# Patient Record
Sex: Male | Born: 1960 | Race: White | Hispanic: No | Smoking: Former smoker
Health system: Southern US, Community
[De-identification: ages and names within clinical notes are randomized; demographics above are authoritative.]

## PROBLEM LIST (undated history)

## (undated) DIAGNOSIS — F329 Major depressive disorder, single episode, unspecified: Secondary | ICD-10-CM

## (undated) DIAGNOSIS — M6281 Muscle weakness (generalized): Secondary | ICD-10-CM

## (undated) DIAGNOSIS — R41841 Cognitive communication deficit: Secondary | ICD-10-CM

## (undated) DIAGNOSIS — D649 Anemia, unspecified: Secondary | ICD-10-CM

## (undated) DIAGNOSIS — I10 Essential (primary) hypertension: Secondary | ICD-10-CM

## (undated) DIAGNOSIS — L02415 Cutaneous abscess of right lower limb: Secondary | ICD-10-CM

## (undated) DIAGNOSIS — I2510 Atherosclerotic heart disease of native coronary artery without angina pectoris: Secondary | ICD-10-CM

## (undated) DIAGNOSIS — F1010 Alcohol abuse, uncomplicated: Secondary | ICD-10-CM

## (undated) DIAGNOSIS — I739 Peripheral vascular disease, unspecified: Secondary | ICD-10-CM

## (undated) DIAGNOSIS — G4730 Sleep apnea, unspecified: Secondary | ICD-10-CM

## (undated) DIAGNOSIS — L03115 Cellulitis of right lower limb: Secondary | ICD-10-CM

## (undated) DIAGNOSIS — I82210 Acute embolism and thrombosis of superior vena cava: Secondary | ICD-10-CM

## (undated) DIAGNOSIS — J449 Chronic obstructive pulmonary disease, unspecified: Secondary | ICD-10-CM

## (undated) DIAGNOSIS — K219 Gastro-esophageal reflux disease without esophagitis: Secondary | ICD-10-CM

## (undated) DIAGNOSIS — I509 Heart failure, unspecified: Secondary | ICD-10-CM

## (undated) DIAGNOSIS — E039 Hypothyroidism, unspecified: Secondary | ICD-10-CM

## (undated) DIAGNOSIS — J45909 Unspecified asthma, uncomplicated: Secondary | ICD-10-CM

## (undated) DIAGNOSIS — G629 Polyneuropathy, unspecified: Secondary | ICD-10-CM

## (undated) DIAGNOSIS — E7800 Pure hypercholesterolemia, unspecified: Secondary | ICD-10-CM

## (undated) DIAGNOSIS — R600 Localized edema: Secondary | ICD-10-CM

## (undated) DIAGNOSIS — J189 Pneumonia, unspecified organism: Secondary | ICD-10-CM

## (undated) DIAGNOSIS — E785 Hyperlipidemia, unspecified: Secondary | ICD-10-CM

## (undated) DIAGNOSIS — G4733 Obstructive sleep apnea (adult) (pediatric): Secondary | ICD-10-CM

## (undated) DIAGNOSIS — N189 Chronic kidney disease, unspecified: Secondary | ICD-10-CM

## (undated) DIAGNOSIS — R2689 Other abnormalities of gait and mobility: Secondary | ICD-10-CM

## (undated) DIAGNOSIS — E781 Pure hyperglyceridemia: Secondary | ICD-10-CM

## (undated) DIAGNOSIS — E119 Type 2 diabetes mellitus without complications: Secondary | ICD-10-CM

## (undated) HISTORY — DX: Hyperlipidemia, unspecified: E78.5

## (undated) HISTORY — DX: Alcohol abuse, uncomplicated: F10.10

## (undated) HISTORY — DX: Obstructive sleep apnea (adult) (pediatric): G47.33

## (undated) HISTORY — DX: Other abnormalities of gait and mobility: R26.89

## (undated) HISTORY — DX: Muscle weakness (generalized): M62.81

## (undated) HISTORY — DX: Acute embolism and thrombosis of superior vena cava: I82.210

## (undated) HISTORY — DX: Major depressive disorder, single episode, unspecified: F32.9

## (undated) HISTORY — DX: Cognitive communication deficit: R41.841

---

## 2004-11-30 ENCOUNTER — Ambulatory Visit (HOSPITAL_COMMUNITY): Admission: RE | Admit: 2004-11-30 | Discharge: 2004-11-30 | Payer: Self-pay | Admitting: Emergency Medicine

## 2004-11-30 ENCOUNTER — Encounter (INDEPENDENT_AMBULATORY_CARE_PROVIDER_SITE_OTHER): Payer: Self-pay | Admitting: *Deleted

## 2004-11-30 ENCOUNTER — Inpatient Hospital Stay (HOSPITAL_COMMUNITY): Admission: EM | Admit: 2004-11-30 | Discharge: 2004-12-07 | Payer: Self-pay | Admitting: Emergency Medicine

## 2005-07-16 ENCOUNTER — Ambulatory Visit: Payer: Self-pay | Admitting: Endocrinology

## 2005-07-18 ENCOUNTER — Ambulatory Visit: Payer: Self-pay | Admitting: Endocrinology

## 2005-08-16 ENCOUNTER — Ambulatory Visit: Payer: Self-pay | Admitting: Endocrinology

## 2005-09-06 ENCOUNTER — Ambulatory Visit: Payer: Self-pay | Admitting: Endocrinology

## 2007-01-02 ENCOUNTER — Ambulatory Visit: Payer: Self-pay | Admitting: Endocrinology

## 2007-06-17 ENCOUNTER — Encounter: Payer: Self-pay | Admitting: Endocrinology

## 2007-06-17 DIAGNOSIS — I10 Essential (primary) hypertension: Secondary | ICD-10-CM

## 2007-06-17 DIAGNOSIS — K219 Gastro-esophageal reflux disease without esophagitis: Secondary | ICD-10-CM

## 2007-07-28 ENCOUNTER — Ambulatory Visit: Payer: Self-pay | Admitting: Endocrinology

## 2007-07-29 ENCOUNTER — Encounter: Payer: Self-pay | Admitting: Endocrinology

## 2007-07-29 ENCOUNTER — Ambulatory Visit: Payer: Self-pay | Admitting: Endocrinology

## 2007-08-22 ENCOUNTER — Telehealth: Payer: Self-pay | Admitting: Endocrinology

## 2007-09-17 ENCOUNTER — Telehealth (INDEPENDENT_AMBULATORY_CARE_PROVIDER_SITE_OTHER): Payer: Self-pay | Admitting: *Deleted

## 2007-12-29 ENCOUNTER — Encounter: Payer: Self-pay | Admitting: Endocrinology

## 2008-02-19 ENCOUNTER — Ambulatory Visit: Payer: Self-pay | Admitting: Family Medicine

## 2008-02-19 DIAGNOSIS — E785 Hyperlipidemia, unspecified: Secondary | ICD-10-CM

## 2008-02-19 DIAGNOSIS — E669 Obesity, unspecified: Secondary | ICD-10-CM | POA: Insufficient documentation

## 2008-02-19 DIAGNOSIS — F329 Major depressive disorder, single episode, unspecified: Secondary | ICD-10-CM | POA: Insufficient documentation

## 2008-02-19 DIAGNOSIS — E1169 Type 2 diabetes mellitus with other specified complication: Secondary | ICD-10-CM

## 2008-03-23 ENCOUNTER — Ambulatory Visit: Payer: Self-pay | Admitting: Endocrinology

## 2008-04-07 ENCOUNTER — Telehealth: Payer: Self-pay | Admitting: Family Medicine

## 2008-04-13 ENCOUNTER — Ambulatory Visit: Payer: Self-pay | Admitting: Endocrinology

## 2008-04-19 ENCOUNTER — Encounter: Payer: Self-pay | Admitting: Family Medicine

## 2008-04-19 ENCOUNTER — Ambulatory Visit: Payer: Self-pay | Admitting: Sports Medicine

## 2008-04-19 LAB — CONVERTED CEMR LAB: Hgb A1c MFr Bld: 8.2 %

## 2008-04-20 LAB — CONVERTED CEMR LAB
ALT: 28 units/L (ref 0–53)
AST: 19 units/L (ref 0–37)
Albumin: 3.8 g/dL (ref 3.5–5.2)
CO2: 26 meq/L (ref 19–32)
Chloride: 97 meq/L (ref 96–112)
Creatinine, Ser: 1.54 mg/dL — ABNORMAL HIGH (ref 0.40–1.50)
Glucose, Bld: 260 mg/dL — ABNORMAL HIGH (ref 70–99)
Hemoglobin: 14.1 g/dL (ref 13.0–17.0)
LDL Cholesterol: 48 mg/dL (ref 0–99)
MCV: 87.6 fL (ref 78.0–100.0)
Potassium: 4.5 meq/L (ref 3.5–5.3)
RBC: 5 M/uL (ref 4.22–5.81)
Total CHOL/HDL Ratio: 5.2
Total Protein: 6.8 g/dL (ref 6.0–8.3)
Triglycerides: 385 mg/dL — ABNORMAL HIGH (ref <150)
VLDL: 77 mg/dL — ABNORMAL HIGH (ref 0–40)

## 2008-04-26 ENCOUNTER — Telehealth (INDEPENDENT_AMBULATORY_CARE_PROVIDER_SITE_OTHER): Payer: Self-pay | Admitting: *Deleted

## 2008-05-19 ENCOUNTER — Telehealth (INDEPENDENT_AMBULATORY_CARE_PROVIDER_SITE_OTHER): Payer: Self-pay | Admitting: *Deleted

## 2008-06-07 ENCOUNTER — Telehealth (INDEPENDENT_AMBULATORY_CARE_PROVIDER_SITE_OTHER): Payer: Self-pay | Admitting: *Deleted

## 2008-06-08 ENCOUNTER — Encounter: Payer: Self-pay | Admitting: Family Medicine

## 2008-06-16 ENCOUNTER — Telehealth (INDEPENDENT_AMBULATORY_CARE_PROVIDER_SITE_OTHER): Payer: Self-pay | Admitting: *Deleted

## 2008-06-18 ENCOUNTER — Telehealth (INDEPENDENT_AMBULATORY_CARE_PROVIDER_SITE_OTHER): Payer: Self-pay | Admitting: *Deleted

## 2008-06-24 ENCOUNTER — Encounter: Payer: Self-pay | Admitting: Family Medicine

## 2008-07-14 ENCOUNTER — Ambulatory Visit: Payer: Self-pay | Admitting: Family Medicine

## 2008-08-11 ENCOUNTER — Ambulatory Visit: Payer: Self-pay | Admitting: Endocrinology

## 2008-09-21 ENCOUNTER — Ambulatory Visit: Payer: Self-pay | Admitting: Family Medicine

## 2008-10-08 HISTORY — PX: APPENDECTOMY: SHX54

## 2008-11-02 ENCOUNTER — Ambulatory Visit: Payer: Self-pay | Admitting: Endocrinology

## 2008-11-02 LAB — CONVERTED CEMR LAB: Hgb A1c MFr Bld: 8.7 % — ABNORMAL HIGH (ref 4.6–6.0)

## 2008-12-16 ENCOUNTER — Encounter: Payer: Self-pay | Admitting: Family Medicine

## 2008-12-16 ENCOUNTER — Ambulatory Visit: Payer: Self-pay | Admitting: Family Medicine

## 2008-12-16 DIAGNOSIS — R609 Edema, unspecified: Secondary | ICD-10-CM | POA: Insufficient documentation

## 2008-12-17 ENCOUNTER — Telehealth: Payer: Self-pay | Admitting: Family Medicine

## 2008-12-17 LAB — CONVERTED CEMR LAB
BUN: 17 mg/dL (ref 6–23)
Glucose, Bld: 64 mg/dL — ABNORMAL LOW (ref 70–99)
Potassium: 4.1 meq/L (ref 3.5–5.3)
Sodium: 139 meq/L (ref 135–145)

## 2009-01-03 ENCOUNTER — Telehealth (INDEPENDENT_AMBULATORY_CARE_PROVIDER_SITE_OTHER): Payer: Self-pay | Admitting: *Deleted

## 2009-01-13 ENCOUNTER — Telehealth: Payer: Self-pay | Admitting: Family Medicine

## 2009-01-24 ENCOUNTER — Encounter: Payer: Self-pay | Admitting: Family Medicine

## 2009-01-24 ENCOUNTER — Ambulatory Visit: Payer: Self-pay | Admitting: Family Medicine

## 2009-01-24 DIAGNOSIS — G4730 Sleep apnea, unspecified: Secondary | ICD-10-CM

## 2009-01-24 DIAGNOSIS — G4733 Obstructive sleep apnea (adult) (pediatric): Secondary | ICD-10-CM | POA: Insufficient documentation

## 2009-01-25 LAB — CONVERTED CEMR LAB
CO2: 23 meq/L (ref 19–32)
Calcium: 9.9 mg/dL (ref 8.4–10.5)
Chloride: 101 meq/L (ref 96–112)
Creatinine, Ser: 1.48 mg/dL (ref 0.40–1.50)

## 2009-01-28 ENCOUNTER — Telehealth: Payer: Self-pay | Admitting: Family Medicine

## 2009-02-01 ENCOUNTER — Ambulatory Visit: Payer: Self-pay | Admitting: Endocrinology

## 2009-02-07 ENCOUNTER — Ambulatory Visit (HOSPITAL_BASED_OUTPATIENT_CLINIC_OR_DEPARTMENT_OTHER): Admission: RE | Admit: 2009-02-07 | Discharge: 2009-02-07 | Payer: Self-pay | Admitting: Family Medicine

## 2009-02-07 ENCOUNTER — Encounter: Payer: Self-pay | Admitting: Family Medicine

## 2009-02-08 ENCOUNTER — Encounter: Payer: Self-pay | Admitting: Family Medicine

## 2009-02-10 ENCOUNTER — Telehealth: Payer: Self-pay | Admitting: Family Medicine

## 2009-02-19 ENCOUNTER — Ambulatory Visit: Payer: Self-pay | Admitting: Internal Medicine

## 2009-03-03 ENCOUNTER — Ambulatory Visit: Payer: Self-pay | Admitting: Endocrinology

## 2009-03-03 DIAGNOSIS — E1139 Type 2 diabetes mellitus with other diabetic ophthalmic complication: Secondary | ICD-10-CM

## 2009-03-03 LAB — CONVERTED CEMR LAB: Hgb A1c MFr Bld: 8 % — ABNORMAL HIGH (ref 4.6–6.5)

## 2009-03-09 ENCOUNTER — Telehealth: Payer: Self-pay | Admitting: Family Medicine

## 2009-03-23 ENCOUNTER — Encounter: Payer: Self-pay | Admitting: Endocrinology

## 2009-04-19 ENCOUNTER — Ambulatory Visit: Payer: Self-pay | Admitting: Family Medicine

## 2009-04-20 ENCOUNTER — Telehealth: Payer: Self-pay | Admitting: Family Medicine

## 2009-04-25 ENCOUNTER — Telehealth: Payer: Self-pay | Admitting: Family Medicine

## 2009-04-27 ENCOUNTER — Telehealth: Payer: Self-pay | Admitting: *Deleted

## 2009-05-11 ENCOUNTER — Telehealth (INDEPENDENT_AMBULATORY_CARE_PROVIDER_SITE_OTHER): Payer: Self-pay | Admitting: *Deleted

## 2009-05-12 ENCOUNTER — Telehealth: Payer: Self-pay | Admitting: Family Medicine

## 2009-05-16 ENCOUNTER — Ambulatory Visit: Payer: Self-pay | Admitting: Family Medicine

## 2009-05-17 ENCOUNTER — Telehealth: Payer: Self-pay | Admitting: Endocrinology

## 2009-05-19 ENCOUNTER — Ambulatory Visit: Payer: Self-pay | Admitting: Family Medicine

## 2009-05-23 ENCOUNTER — Telehealth: Payer: Self-pay | Admitting: Endocrinology

## 2009-05-24 ENCOUNTER — Ambulatory Visit (HOSPITAL_BASED_OUTPATIENT_CLINIC_OR_DEPARTMENT_OTHER): Admission: RE | Admit: 2009-05-24 | Discharge: 2009-05-24 | Payer: Self-pay | Admitting: Family Medicine

## 2009-05-24 ENCOUNTER — Encounter: Payer: Self-pay | Admitting: Family Medicine

## 2009-05-25 ENCOUNTER — Telehealth: Payer: Self-pay | Admitting: Family Medicine

## 2009-05-28 ENCOUNTER — Ambulatory Visit: Payer: Self-pay | Admitting: Internal Medicine

## 2009-06-03 ENCOUNTER — Encounter: Payer: Self-pay | Admitting: Family Medicine

## 2009-06-09 ENCOUNTER — Ambulatory Visit: Payer: Self-pay | Admitting: Family Medicine

## 2009-07-26 ENCOUNTER — Telehealth: Payer: Self-pay | Admitting: Family Medicine

## 2009-07-28 ENCOUNTER — Telehealth: Payer: Self-pay | Admitting: *Deleted

## 2009-10-25 ENCOUNTER — Encounter: Payer: Self-pay | Admitting: Family Medicine

## 2009-10-25 ENCOUNTER — Ambulatory Visit: Payer: Self-pay | Admitting: Family Medicine

## 2009-10-25 ENCOUNTER — Encounter: Payer: Self-pay | Admitting: *Deleted

## 2009-10-26 LAB — CONVERTED CEMR LAB
ALT: 58 units/L — ABNORMAL HIGH (ref 0–53)
Alkaline Phosphatase: 111 units/L (ref 39–117)
BUN: 17 mg/dL (ref 6–23)
CO2: 29 meq/L (ref 19–32)
Calcium: 9.6 mg/dL (ref 8.4–10.5)
Chloride: 98 meq/L (ref 96–112)
Glucose, Bld: 164 mg/dL — ABNORMAL HIGH (ref 70–99)
HDL: 37 mg/dL — ABNORMAL LOW (ref >39)
LDL Cholesterol: 74 mg/dL (ref 0–99)
Sodium: 136 meq/L (ref 135–145)
Total CHOL/HDL Ratio: 5.1
Triglycerides: 380 mg/dL — ABNORMAL HIGH (ref <150)
VLDL: 76 mg/dL — ABNORMAL HIGH (ref 0–40)

## 2009-10-27 ENCOUNTER — Encounter: Payer: Self-pay | Admitting: Family Medicine

## 2009-10-27 ENCOUNTER — Telehealth: Payer: Self-pay | Admitting: Family Medicine

## 2009-11-03 ENCOUNTER — Ambulatory Visit: Payer: Self-pay | Admitting: Endocrinology

## 2009-11-15 ENCOUNTER — Telehealth: Payer: Self-pay | Admitting: Family Medicine

## 2009-11-22 ENCOUNTER — Ambulatory Visit: Payer: Self-pay | Admitting: Family Medicine

## 2009-11-30 ENCOUNTER — Telehealth: Payer: Self-pay | Admitting: Family Medicine

## 2010-02-21 ENCOUNTER — Telehealth: Payer: Self-pay | Admitting: Family Medicine

## 2010-05-24 ENCOUNTER — Ambulatory Visit: Payer: Self-pay | Admitting: Family Medicine

## 2010-06-28 ENCOUNTER — Encounter: Payer: Self-pay | Admitting: Family Medicine

## 2010-06-28 ENCOUNTER — Ambulatory Visit: Payer: Self-pay | Admitting: Family Medicine

## 2010-06-28 LAB — CONVERTED CEMR LAB
AST: 21 units/L (ref 0–37)
Albumin: 3.9 g/dL (ref 3.5–5.2)
CO2: 28 meq/L (ref 19–32)
Calcium: 9.4 mg/dL (ref 8.4–10.5)
Cholesterol: 170 mg/dL (ref 0–200)
Creatinine, Ser: 1.27 mg/dL (ref 0.40–1.50)
Glucose, Bld: 152 mg/dL — ABNORMAL HIGH (ref 70–99)
HCT: 47 % (ref 39.0–52.0)
HDL: 36 mg/dL — ABNORMAL LOW (ref >39)
Hgb A1c MFr Bld: 7.7 %
LDL Cholesterol: 57 mg/dL (ref 0–99)
MCHC: 31.7 g/dL (ref 30.0–36.0)
Potassium: 4.3 meq/L (ref 3.5–5.3)
Sodium: 139 meq/L (ref 135–145)
Total CHOL/HDL Ratio: 4.7
Triglycerides: 384 mg/dL — ABNORMAL HIGH (ref <150)
VLDL: 77 mg/dL — ABNORMAL HIGH (ref 0–40)

## 2010-07-03 ENCOUNTER — Telehealth: Payer: Self-pay | Admitting: Family Medicine

## 2010-07-04 ENCOUNTER — Encounter: Payer: Self-pay | Admitting: Family Medicine

## 2010-08-01 ENCOUNTER — Telehealth: Payer: Self-pay | Admitting: Family Medicine

## 2010-08-07 ENCOUNTER — Telehealth: Payer: Self-pay | Admitting: Endocrinology

## 2010-08-08 ENCOUNTER — Telehealth: Payer: Self-pay | Admitting: Endocrinology

## 2010-09-13 ENCOUNTER — Encounter: Payer: Self-pay | Admitting: Family Medicine

## 2010-10-03 ENCOUNTER — Telehealth: Payer: Self-pay | Admitting: Family Medicine

## 2010-10-06 ENCOUNTER — Telehealth: Payer: Self-pay | Admitting: *Deleted

## 2010-11-06 ENCOUNTER — Telehealth: Payer: Self-pay | Admitting: Family Medicine

## 2010-11-09 NOTE — Progress Notes (Signed)
Summary: Furosemide 20mg  qd-bid prn #90 x0  Phone Note Refill Request Call back at [phone] Message from:  Patient  Refills Requested: Medication #1:  FUROSEMIDE 20 MG TABS 1 tablet by mouth twice a day for edema   Supply Requested: 3 months Initial call taken by: Audie Clear,  November 15, 2009 10:42 AM  Follow-up for Phone Call        to pcp Follow-up by: Elige Radon RN,  November 15, 2009 10:44 AM    Prescriptions: FUROSEMIDE 20 MG TABS (FUROSEMIDE) 1 tablet by mouth twice a day for edema  #90 x 0   Entered and Authorized by:   Dion Body  MD   Signed by:   Dion Body  MD on 11/15/2009   Method used:   Electronically to        [facility]. [phone]* (retail)       [address]       [place], [place]  [address]       Ph: [phone] or [phone]       Fax: [phone]   RxID:   [id number]

## 2010-11-09 NOTE — Assessment & Plan Note (Signed)
Summary: f/u chronic issues   Vital Signs:  Patient profile:   50 year old male Height:      71 inches Weight:      310 pounds BMI:     43.39 BSA:     2.54 Temp:     99.2 degrees F Pulse rate:   86 / minute BP sitting:   179 / 99  Vitals Entered By: Christen Bame CMA (October 25, 2009 8:52 AM)  Serial Vital Signs/Assessments:  Time      Position  BP       Pulse  Resp  Temp     By                     168/98                         Dion Body  MD  Comments: 8:55 AM Manual BP: 160/98 By: Christen Bame CMA  Right arm- sitting, manual By: Dion Body  MD   CC: f/u Is Patient Diabetic? Yes Pain Assessment Patient in pain? no        Primary Care Provider:  Dion Body  MD  CC:  f/u.  History of Present Illness: 50yo M here to f/u on chronic issues  HTN: No adverse effects from medication.  Not checking it regularly.  Was well controlled at last visit.  No dizziness, HA, CP, palpitations.  Improved swelling with use of compression stockings, exercise, and furosemide.  DM:  Currently on Humulin and Novolog.  No adverse effects.  Isolated hypoglycemic events when he doesn't eat.  No paresthesia or peripheral nerve pain.  CBGs checked 2-4 times a day and typically run b/w 130-170 with occasional readings up to 300.    HLD: Tolerating medication.  No adverse effects.  No muscle pain or abd pain.  Obesity: Lost 18 lbs in the past Nov with exercise and healthy food choices.  Sleep Apnea: Not able to tolerate mask.  Currently on full mask.  Has not used the CPAP in a few months.  Habits & Providers  Alcohol-Tobacco-Diet     Tobacco Status: never  Current Medications (verified): 1)  Zetia 10 Mg  Tabs (Ezetimibe) .... Take 1 By Mouth Qd 2)  Altace 10 Mg  Caps (Ramipril) .... Take 1 By Mouth Bid 3)  Metoprolol Succinate 200 Mg Xr24h-Tab (Metoprolol Succinate) .... One Tablet By Mouth Daily 4)  Crestor 40 Mg  Tabs (Rosuvastatin Calcium) .... Take  One Tablet By Mouth At Bedtime 5)  Exel Pen Needles 1/3" 31g X 8 Mm  Misc (Insulin Pen Needle) .... Qid--Any Brand 6)  Accu-Chek Multiclix Lancets  Misc (Lancets) .... Check Blood Sugar Up To 3 Times A Day Disp:  1 Box 7)  Bupropion Hcl 150 Mg  Tb24 (Bupropion Hcl) .... Take One Tablet By Mouth Daily 8)  Nexium 20 Mg Pack (Esomeprazole Magnesium) .... One Tablet By Mouth Daily 9)  Anacin 81 Mg  Tbec (Aspirin) .... Take One Tablet By Mouth Daily 10)  Novolog Flexpen 100 Unit/ml  Soln (Insulin Aspart) .... Taken 4 Times A Day, 30-45-60-10 Units 11)  Humulin N Pen 100 Unit/ml Susp (Insulin Isophane Human) .... 30 Units Qhs 12)  Furosemide 20 Mg Tabs (Furosemide) .Marland Kitchen.. 1 Tablet By Mouth Twice A Day For Edema Disp: 90 Day Supply 13)  Accu-Chek Aviva  Strp (Glucose Blood) .... Use To Check Blood Sugar Up To 3 Times A Day  Disp: 1 Box 14)  Amlodipine Besylate 5 Mg Tabs (Amlodipine Besylate) .Marland Kitchen.. 1 Tablet By Mouth Daily For High Blood Pressure  Allergies (verified): 1)  ! Codeine 2)  Metformin Hcl  Past History:  Past Medical History: Diabetes mellitus, type II GERD Hypertension Hyperlipidemia Morbid Obesity Depression Diabetic retinopathy OSA  Social History: Separated from wife with  daughter.  Lives in [place].  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  No tobacco use.  Rarely drinks EtOH.  No drugs.  Cycling on the weekends.  [facility] contact # [phone] (cell)  Review of Systems       No dizziness, HA, CP, palpitations.  Improved swelling No paresthesia or peripheral nerve pain. No muscle pain or abd pain.  Physical Exam  General:  VS reviewed and rechecked.  Morbidly obese, A&O, NAD Eyes:  EOMI.  PERRLA.  Limited fundoscopic exam- no gross abnormalities Mouth:  nl dentition Neck:  supple full ROM Lungs:  Normal respiratory effort, chest expands symmetrically. Lungs are clear to auscultation, no crackles or wheezes. Heart:  Normal rate and regular rhythm. S1 and S2  normal without gallop, murmur, click, rub or other extra sounds. Abdomen:  obese, soft, NT, ND, no HSM, active BS Extremities:  1+ pitting edema; compression stockings in place Neurologic:  no focal deficits Psych:  Appropriate affect   Impression & Recommendations:  Problem # 1:  HYPERTENSION (ICD-401.9) Assessment Deteriorated  Not at goal despite taking all meds, exercise, and wt loss.  Plan to add amlodipine 5mg  daily.  Advised pt to let me know if his swelling worsens after taking the medication.  Will f/u in 1 month to reassess.   His updated medication list for this problem includes:    Altace 10 Mg Caps (Ramipril) .Marland Kitchen... Take 1 by mouth bid    Metoprolol Succinate 200 Mg Xr24h-tab (Metoprolol succinate) ..... One tablet by mouth daily    Furosemide 20 Mg Tabs (Furosemide) .Marland Kitchen... 1 tablet by mouth twice a day for edema disp: 90 day supply    Amlodipine Besylate 5 Mg Tabs (Amlodipine besylate) .Marland Kitchen... 1 tablet by mouth daily for high blood pressure  Orders: [place]- Est  Level 4 [id number])  Problem # 2:  DIABETES MELLITUS, TYPE II (ICD-250.00) Assessment: Improved A1c 7.8%.  Improved since starting insulin.  Plan to f/u with Dr. Valentina Lucks.  No changes to regimen at this time.  Did not tolerate metformin in the past.   His updated medication list for this problem includes:    Altace 10 Mg Caps (Ramipril) .Marland Kitchen... Take 1 by mouth bid    Anacin 81 Mg Tbec (Aspirin) .Marland Kitchen... Take one tablet by mouth daily    Novolog Flexpen 100 Unit/ml Soln (Insulin aspart) .Marland Kitchen... Taken 4 times a day, 30-45-60-10 units    Humulin N Pen 100 Unit/ml Susp (Insulin isophane human) .Marland KitchenMarland KitchenMarland KitchenMarland Kitchen 30 units qhs  Orders: A1C-[facility] [id number]) [place]- Est  Level 4 [id number])  Problem # 3:  DIABETIC  RETINOPATHY (ICD-250.50) Assessment: Unchanged  Followed by opthalmology.  Next appt next month.  Diabetes improving.   His updated medication list for this problem includes:    Altace 10 Mg Caps (Ramipril) .Marland Kitchen... Take 1 by mouth bid     Anacin 81 Mg Tbec (Aspirin) .Marland Kitchen... Take one tablet by mouth daily    Novolog Flexpen 100 Unit/ml Soln (Insulin aspart) .Marland Kitchen... Taken 4 times a day, 30-45-60-10 units    Humulin N Pen 100 Unit/ml Susp (Insulin isophane human) .Marland KitchenMarland KitchenMarland KitchenMarland Kitchen 30 units qhs  Orders: [place]- Est  Level 4 ([id number])  Problem # 4:  HYPERLIPIDEMIA (ICD-272.4) Assessment: Unchanged Has been at goal (<70).  No changes to his regimen at this time.  Check CMET and Lipid panel today.  Plan to provide refills to [facility] after results reviewed.   His updated medication list for this problem includes:    Zetia 10 Mg Tabs (Ezetimibe) .Marland Kitchen... Take 1 by mouth qd    Crestor 40 Mg Tabs (Rosuvastatin calcium) .Marland Kitchen... Take one tablet by mouth at bedtime  Orders: T-Lipid Profile [id number]) [place]- Est  Level 4 [id number])  Problem # 5:  EDEMA (ICD-782.3) Assessment: Improved  Improving with exercise, wt loss, compression stockings, and furosemide.  Advised to keep legs elevated when resting at home.   His updated medication list for this problem includes:    Furosemide 20 Mg Tabs (Furosemide) .Marland Kitchen... 1 tablet by mouth twice a day for edema disp: 90 day supply  Orders: [place]- Est  Level 4 [id number])  Problem # 6:  MORBID OBESITY (ICD-278.01) Assessment: Improved  Lost 8lbs since 06/2009.  Encouraged and praised him for wt loss.  Continue with healthy food choices and exercise.  Orders: [place]- Est  Level 4 [id number])  Problem # 7:  GERD (ICD-530.81) Assessment: Comment Only Symptoms controlled but wants to switch to omeprazole.   His updated medication list for this problem includes:    Nexium 20 Mg Pack (Esomeprazole magnesium) ..... One tablet by mouth daily  Problem # 8:  SLEEP APNEA (ICD-780.57) Assessment: Deteriorated  Not using the CPAP machine b/c he can't tolerate the mask.  I will call [facility] to find out how they can fit him for a better mask that he can tolerate.  This would help with all of his morbid health  issues.  Orders: [place]- Est  Level 4 [id number])  Problem # 9:  Preventive Health Care (ICD-V70.0) Assessment: Comment Only Flu shot provided today.    Complete Medication List: 1)  Zetia 10 Mg Tabs (Ezetimibe) .... Take 1 by mouth qd 2)  Altace 10 Mg Caps (Ramipril) .... Take 1 by mouth bid 3)  Metoprolol Succinate 200 Mg Xr24h-tab (Metoprolol succinate) .... One tablet by mouth daily 4)  Crestor 40 Mg Tabs (Rosuvastatin calcium) .... Take one tablet by mouth at bedtime 5)  Exel Pen Needles 1/3" 31g X 8 Mm Misc (Insulin pen needle) .... Qid--any brand 6)  Accu-chek Multiclix Lancets Misc (Lancets) .... Check blood sugar up to 3 times a day disp:  1 box 7)  Bupropion Hcl 150 Mg Tb24 (Bupropion hcl) .... Take one tablet by mouth daily 8)  Nexium 20 Mg Pack (Esomeprazole magnesium) .... One tablet by mouth daily 9)  Anacin 81 Mg Tbec (Aspirin) .... Take one tablet by mouth daily 10)  Novolog Flexpen 100 Unit/ml Soln (Insulin aspart) .... Taken 4 times a day, 30-45-60-10 units 11)  Humulin N Pen 100 Unit/ml Susp (Insulin isophane human) .... 30 units qhs 12)  Furosemide 20 Mg Tabs (Furosemide) .Marland Kitchen.. 1 tablet by mouth twice a day for edema disp: 90 day supply 13)  Accu-chek Aviva Strp (Glucose blood) .... Use to check blood sugar up to 3 times a day  disp: 1 box 14)  Amlodipine Besylate 5 Mg Tabs (Amlodipine besylate) .Marland Kitchen.. 1 tablet by mouth daily for high blood pressure  Other Orders: T-Comprehensive Metabolic Panel ([id number])  Patient Instructions: 1)  Please schedule a follow-up appointment in 1 month to reassess blood pressure and new medication tolerance. 2)  I started you  on Amlodipine 5mg  daily for blood pressure control.  I want you to start checking your blood pressure at home 3-4 times a week.   3)  I will fill your medications [facility] to [facility] for 6 month supply. Prescriptions: AMLODIPINE BESYLATE 5 MG TABS (AMLODIPINE BESYLATE) 1 tablet by mouth daily for high blood pressure  #34 x  1   Entered and Authorized by:   Dion Body  MD   Signed by:   Dion Body  MD on 10/25/2009   Method used:   Electronically to        [facility]. [phone]* (retail)       [address]       [place], [place]  [address]       Ph: [phone] or [phone]       Fax: [phone]   RxID:   [id number]    Prevention & Chronic Care Immunizations   Influenza vaccine: Fluvax Non-MCR  (07/14/2008)    Tetanus booster: Not documented   Td booster deferral: Not indicated  (10/25/2009)   Tetanus booster due: 10/08/2013    Pneumococcal vaccine: Not documented  Other Screening   Smoking status: never  (10/25/2009)  Diabetes Mellitus   HgbA1C: 7.8  (10/25/2009)   HgbA1C action/deferral: Ordered  (10/25/2009)    Eye exam: Not documented    Foot exam: yes  (04/19/2008)   High risk foot: Not documented   Foot care education: Not documented    Urine microalbumin/creatinine ratio: Not documented    Diabetes flowsheet reviewed?: Yes   Progress toward A1C goal: Unchanged  Lipids   Total Cholesterol: 155  (04/19/2008)   Lipid panel action/deferral: Lipid Panel ordered   LDL: 48  (04/19/2008)   LDL Direct: Not documented   HDL: 30  (04/19/2008)   Triglycerides: 385  (04/19/2008)    SGOT (AST): 19  (04/19/2008)   BMP action: Ordered   SGPT (ALT): 28  (04/19/2008) CMP ordered    Alkaline phosphatase: 111  (04/19/2008)   Total bilirubin: 0.5  (04/19/2008)    Lipid flowsheet reviewed?: Yes   Progress toward LDL goal: At goal  Hypertension   Last Blood Pressure: 179 / 99  (10/25/2009)   Serum creatinine: 1.48  (01/24/2009)   BMP action: Ordered   Serum potassium 4.5  (01/24/2009) CMP ordered     Hypertension flowsheet reviewed?: Yes   Progress toward BP goal: Unchanged  Self-Management Support :   Personal Goals (by the next clinic visit) :     Personal A1C goal: 8  (10/25/2009)     Personal blood pressure goal: 140/90  (10/25/2009)      Personal LDL goal: 100  (10/25/2009)    Patient will work on the following items until the next clinic visit to reach self-care goals:     Medications and monitoring: take my medicines every day, check my blood sugar, check my blood pressure, bring all of my medications to every visit, weigh myself weekly  (10/25/2009)     Eating: drink diet soda or water instead of juice or soda, eat more vegetables, use fresh or frozen vegetables, eat foods that are low in salt, eat baked foods instead of fried foods, eat fruit for snacks and desserts, limit or avoid alcohol  (10/25/2009)    Diabetes self-management support: CBG self-monitoring log, Written self-care plan, Education handout  (10/25/2009)   Diabetes care plan printed   Diabetes education handout printed    Hypertension  self-management support: CBG self-monitoring log, Written self-care plan  (10/25/2009)   Hypertension self-care plan printed.    Lipid self-management support: Written self-care plan  (10/25/2009)   Lipid self-care plan printed.   Nursing Instructions: Give Flu vaccine today HgbA1C today (see order)    Laboratory Results   Blood Tests   Date/Time Received: October 25, 2009 9:43 AM  Date/Time Reported: October 25, 2009 10:01 AM    HGBA1C: 7.8%   (Normal Range: Non-Diabetic - 3-6%   Control Diabetic - 6-8%)  Comments: ...........test performed by...........Marland KitchenHedy Camara, CMA

## 2010-11-09 NOTE — Progress Notes (Signed)
Summary: Rx Req  Phone Note Refill Request Call back at [phone] Message from:  Patient  Refills Requested: Medication #1:  AMLODIPINE BESYLATE 10 MG TABS 1 tab by mouth daily for high blood pressure  Medication #2:  FUROSEMIDE 20 MG TABS 1 tablet by mouth twice a day for edema PT IS OUT AND WANTS ENOUGH TILL THE MAIL ORDER COMES IN.  MAYBE A MONTHS SUPPLY FOR NOW.  [facility] [place].  Initial call taken by: Raymond Gurney,  Feb 21, 2010 10:09 AM    Prescriptions: AMLODIPINE BESYLATE 10 MG TABS (AMLODIPINE BESYLATE) 1 tab by mouth daily for high blood pressure  #30 x 1   Entered and Authorized by:   Dion Body  MD   Signed by:   Dion Body  MD on 02/21/2010   Method used:   Electronically to        [facility]. [phone]* (retail)       [address]       [place], [place]  [address]       Ph: [phone] or [phone]       Fax: [phone]   RxID:   [id number] FUROSEMIDE 20 MG TABS (FUROSEMIDE) 1 tablet by mouth twice a day for edema  #60 x 1   Entered and Authorized by:   Dion Body  MD   Signed by:   Dion Body  MD on 02/21/2010   Method used:   Electronically to        [facility]. [phone]* (retail)       [address]       [place], [place]  [address]       Ph: [phone] or [phone]       Fax: [phone]   RxID:   [id number]

## 2010-11-09 NOTE — Progress Notes (Signed)
Summary: Rx refill req  Phone Note Refill Request Message from:  Patient on August 07, 2010 4:54 PM  Refills Requested: Medication #1:  HUMULIN N PEN 100 UNIT/ML SUSP 60 units qhs   Dosage confirmed as above?Dosage Confirmed   Supply Requested: 1 month  Medication #2:  ACCU-CHEK AVIVA  STRP use to check blood sugar up to 3 times a day  Disp: 1 box   Dosage confirmed as above?Dosage Confirmed   Supply Requested: 1 month Pt request 1 mth Rx to local pharmacy while waiting for mail order pharmacy to ship meds.   Method Requested: Electronic Initial call taken by: Crissie Sickles, CMA,  August 07, 2010 4:54 PM    Prescriptions: HUMALOG KWIKPEN 100 UNIT/ML SOLN (INSULIN LISPRO (HUMAN)) three times a day (just before each meal) 60-60-70 units  #82mth x 1   Entered by:   Crissie Sickles, CMA   Authorized by:   Donavan Foil MD   Signed by:   Crissie Sickles, CMA on 08/07/2010   Method used:   Electronically to        [facility] (retail)       [address]       [place], [place]  [address]       Ph: [phone]       Fax: [phone]   RxID:   [id number] ACCU-CHEK AVIVA  STRP (GLUCOSE BLOOD) use to check blood sugar up to 3 times a day  Disp: 1 box  #36mth x 1   Entered by:   Crissie Sickles, CMA   Authorized by:   Donavan Foil MD   Signed by:   Crissie Sickles, CMA on 08/07/2010   Method used:   Electronically to        [facility] (retail)       [address]       [place], [place]  [address]       Ph: [phone]       Fax: [phone]   RxID:   [id number] HUMULIN N PEN 100 UNIT/ML SUSP (INSULIN ISOPHANE HUMAN) 60 units qhs  #63mth x 1   Entered by:   Crissie Sickles, CMA   Authorized by:   Donavan Foil MD   Signed by:   Crissie Sickles, CMA on 08/07/2010   Method used:   Electronically to        [facility] (retail)       [address]       [place], [place]  [address]       Ph: [phone]       Fax: [phone]   RxID:   [id number]

## 2010-11-09 NOTE — Progress Notes (Signed)
Summary: Rx  Phone Note Call from Patient Call back at [facility] Phone [phone]   Summary of Call: we [facility]xs [facility], rx for acucheck strips needed to be for 90 days instead of 30. [facility] also needs [place] to call [phone] to verify some other rxs Initial call taken by: Samara Snide,  October 06, 2010 2:49 PM  Follow-up for Phone Call        spoke with [facility] concerning rx's and the will call pt to verify rx with him and will call back once verified Follow-up by: Audelia Hives CMA,  October 06, 2010 5:31 PM

## 2010-11-09 NOTE — Miscellaneous (Signed)
Summary: 1 month supply of meds before [facility] refills  Clinical Lists Changes  Medications: Rx of ZETIA 10 MG  TABS (EZETIMIBE) take 1 by mouth qd;  #30 x 0;  Signed;  Entered by: Dion Body  MD;  Authorized by: Dion Body  MD;  Method used: Electronically to [facility]. [phone]*, [address], [place], [place], [place]  [address], Ph: [phone] or [phone], Fax: [phone] Rx of ALTACE 10 MG  CAPS (RAMIPRIL) take 1 by mouth BID;  #60 x 0;  Signed;  Entered by: Dion Body  MD;  Authorized by: Dion Body  MD;  Method used: Electronically to [facility]. [phone]*, [address] [place], [place], [place]  [address], Ph: [phone] or [phone], Fax: [phone] Rx of METOPROLOL SUCCINATE 200 MG XR24H-TAB (METOPROLOL SUCCINATE) one tablet by mouth daily;  #30 x 0;  Signed;  Entered by: Dion Body  MD;  Authorized by: Dion Body  MD;  Method used: Electronically to [facility]. [phone]*, [address] [place], [place], [place]  [address], Ph: [phone] or [phone], Fax: [phone] Rx of CRESTOR 40 MG  TABS (ROSUVASTATIN CALCIUM) take one tablet by mouth at bedtime;  #30 x 0;  Signed;  Entered by: Dion Body  MD;  Authorized by: Dion Body  MD;  Method used: Electronically to [facility]. [phone]*, [address], [place], [place], [place]  [address], Ph: [phone] or [phone], Fax: [phone] Rx of BUPROPION HCL 150 MG  TB24 (BUPROPION HCL) take one tablet by mouth daily;  #30 x 0;  Signed;  Entered by: Dion Body  MD;  Authorized by: Dion Body  MD;  Method used: Electronically to [facility]. [phone]*, [address] [place] [address] [place], [place], [place]  [address], Ph: [phone] or [phone], Fax: [phone] Rx of OMEPRAZOLE 20 MG CPDR (OMEPRAZOLE) 1 tab by mouth daily;  #30 x 0;  Signed;  Entered by: Dion Body  MD;  Authorized by: Dion Body  MD;  Method used: Electronically to [facility]. [phone]*, [address], [place], [place], [place]  [address], Ph: [phone] or [phone], Fax: [phone]    Prescriptions: OMEPRAZOLE 20 MG CPDR (OMEPRAZOLE) 1 tab by mouth daily  #30 x 0   Entered and Authorized by:   Dion Body  MD   Signed by:   Dion Body  MD on 10/27/2009   Method used:   Electronically to        [facility]. [phone]* (retail)       [address]       [place], [place]  [address]       Ph: [phone] or [phone]       Fax: [phone]   [id number] BUPROPION HCL 150 MG  TB24 (BUPROPION HCL) take one tablet by mouth daily  #30 x 0   Entered and Authorized by:   Dion Body  MD   Signed by:   Dion Body  MD on 10/27/2009   Method used:   Electronically to        [facility]. [phone]* (retail)       [address]       [place],   [address]       Ph: [phone] or [phone]       Fax: [phone]   RxID:   [id number]  MG  TABS (ROSUVASTATIN CALCIUM) take one tablet by mouth at bedtime  #30 x 0   Entered and Authorized by:   Dion Body  MD   Signed by:   Dion Body  MD on 10/27/2009   Method used:   Electronically to        [facility]. [phone]* (retail)       [address]       [place], [place]  [address]       Ph: [phone] or [phone]       Fax: [phone]   RxID:   [id number] METOPROLOL SUCCINATE 200 MG XR24H-TAB (METOPROLOL SUCCINATE) one tablet by mouth daily  #30 x 0   Entered and Authorized by:   Dion Body  MD   Signed by:   Dion Body  MD on 10/27/2009   Method used:   Electronically to        [facility]. [phone]* (retail)       [address]       [place], [place]  [address]       Ph: [phone] or [phone]       Fax: [phone]   [id number] ALTACE 10 MG  CAPS (RAMIPRIL) take 1 by mouth BID  #60 x 0   Entered and Authorized by:   Dion Body  MD    Signed by:   Dion Body  MD on 10/27/2009   Method used:   Electronically to        [facility]. [phone]* (retail)       [address]       [place], [place]  [address]       Ph: [phone] or [phone]       Fax: [phone]   [id number] ZETIA 10 MG  TABS (EZETIMIBE) take 1 by mouth qd  #30 x 0   Entered and Authorized by:   Dion Body  MD   Signed by:   Dion Body  MD on 10/27/2009   Method used:   Electronically to        [facility]. [phone]* (retail)       [address]       [place], [place]  [address]       Ph: [phone] or [phone]       Fax: [phone]   RxID:   [id number]

## 2010-11-09 NOTE — Miscellaneous (Signed)
Summary: Furosemide 30 day supply  Clinical Lists Changes  Medications: Rx of FUROSEMIDE 20 MG TABS (FUROSEMIDE) 1 tablet by mouth twice a day for edema;  #30 x 0;  Signed;  Entered by: Dion Body  MD;  Authorized by: Dion Body  MD;  Method used: Electronically to [facility]. [phone]*, [address], [place], [place], [place]  [address], Ph: [phone] or [phone], Fax: [phone]    Prescriptions: FUROSEMIDE 20 MG TABS (FUROSEMIDE) 1 tablet by mouth twice a day for edema  #30 x 0   Entered and Authorized by:   Dion Body  MD   Signed by:   Dion Body  MD on 10/27/2009   Method used:   Electronically to        [facility]. [phone]* (retail)       [address]       [place],   [address]       Ph: [phone] or [phone]       Fax: [phone]   RxID:   [id number]

## 2010-11-09 NOTE — Letter (Signed)
Summary: Results Follow-up Letter  [facility]  [address]   [place], [place] [address]   Phone: [phone]  Fax: [phone]    07/04/2010  [address] [place], [place]  [address]  Dear Steven [name],   The following are the results of your recent test(s):  Cholesterol LDL(Bad cholesterol):   57       Your goal is less than:  100       HDL (Good cholesterol):  36      Your goal is more than:   40 _________________________________________________________ Other Tests:  WBC                       6.9 K/uL                    4.0-10.5   RBC                       4.87 MIL/uL                 4.22-5.81   Hemoglobin                14.9 g/dL                   13.0-17.0   Hematocrit                47.0 %                      39.0-52.0   MCV                       96.5 fL                     78.0-100.0   MCH                       30.6 pg                     26.0-34.0   MCHC                      31.7 g/dL                   30.0-36.0   RDW                       14.3 %                      11.5-15.5   Platelet Count            227 K/uL                    150-400  Tests: (2) Comprehensive Metabolic Panel (123456)   Sodium                    139 mEq/L                   135-145   Potassium                 4.3 mEq/L                   3.5-5.3   Chloride             [  L]  95 mEq/L                    96-112   CO2                       28 mEq/L                    19-32   Glucose              [H]  152 mg/dL                   70-99   BUN                       23 mg/dL                    6-23   Creatinine                1.27 mg/dL                  0.40-1.50   Bilirubin, Total          0.4 mg/dL                   0.3-1.2   Alkaline Phosphatase      102 U/L                     39-117   AST/SGOT                  21 U/L                      0-37   ALT/SGPT                  28 U/L                      0-53   Total Protein             6.5 g/dL                    6.0-8.3  Albumin                   3.9 g/dL                    3.5-5.2   Calcium                   9.4 mg/dL                   8.4-10.5  _______________________________________________________  Please call for an appointment if you have concerns    Sincerely,  Annabell Sabal MD [facility]            Appended Document: Results Follow-up Letter mailed.

## 2010-11-09 NOTE — Assessment & Plan Note (Signed)
Summary: 3 MTH FU  $50  STC - rs'd/cd   Vital Signs:  Patient profile:   50 year old male Height:      70 inches (177.80 cm) Weight:      320.50 pounds (145.68 kg) O2 Sat:      96 % on Room air Temp:     99.2 degrees F (37.33 degrees C) oral Pulse rate:   88 / minute BP sitting:   158 / 70  (left arm) Cuff size:   large  Vitals Entered By: Gardenia Phlegm CMA (November 03, 2009 9:36 AM)  O2 Flow:  Room air CC: 3 month follow up/ pt states he has upped his insulin hisself to 60-60-60-10(pt states he is not always taking the 10) the 30 units at night he upped to 60 also/ CF Is Patient Diabetic? Yes   Primary Provider:  Dion Body  MD  CC:  3 month follow up/ pt states he has upped his insulin hisself to 60-60-60-10(pt states he is not always taking the 10) the 30 units at night he upped to 60 also/ CF.  History of Present Illness: pt says he only has hypoglycemia if he takes humalog, then does not eat.  he has increased humalog to 60 units three times a day (qac), and takes nph to 60 units at bedtime.  no cbg record, but states cbg's are well-controlled, except it is high-100's at hs.    Current Medications (verified): 1)  Zetia 10 Mg  Tabs (Ezetimibe) .... Take 1 By Mouth Qd 2)  Altace 10 Mg  Caps (Ramipril) .... Take 1 By Mouth Bid 3)  Metoprolol Succinate 200 Mg Xr24h-Tab (Metoprolol Succinate) .... One Tablet By Mouth Daily 4)  Crestor 40 Mg  Tabs (Rosuvastatin Calcium) .... Take One Tablet By Mouth At Bedtime 5)  Exel Pen Needles 1/3" 31g X 8 Mm  Misc (Insulin Pen Needle) .... Qid--Any Brand 6)  Accu-Chek Multiclix Lancets  Misc (Lancets) .... Check Blood Sugar Up To 3 Times A Day Disp:  1 Box 7)  Bupropion Hcl 150 Mg  Tb24 (Bupropion Hcl) .... Take One Tablet By Mouth Daily 8)  Omeprazole 20 Mg Cpdr (Omeprazole) .Marland Kitchen.. 1 Tab By Mouth Daily 9)  Anacin 81 Mg  Tbec (Aspirin) .... Take One Tablet By Mouth Daily 10)  Novolog Flexpen 100 Unit/ml  Soln (Insulin Aspart) ....  Taken 4 Times A Day, 30-45-60-10 Units 11)  Humulin N Pen 100 Unit/ml Susp (Insulin Isophane Human) .... 30 Units Qhs 12)  Furosemide 20 Mg Tabs (Furosemide) .Marland Kitchen.. 1 Tablet By Mouth Twice A Day For Edema 13)  Accu-Chek Aviva  Strp (Glucose Blood) .... Use To Check Blood Sugar Up To 3 Times A Day  Disp: 1 Box 14)  Amlodipine Besylate 5 Mg Tabs (Amlodipine Besylate) .Marland Kitchen.. 1 Tablet By Mouth Daily For High Blood Pressure  Allergies (verified): 1)  ! Codeine 2)  Metformin Hcl  Past History:  Past Medical History: Last updated: 10/25/2009 Diabetes mellitus, type II GERD Hypertension Hyperlipidemia Morbid Obesity Depression Diabetic retinopathy OSA  Physical Exam  General:  morbidly obese.  no distress  Pulses:  dorsalis pedis intact bilat.   Extremities:  no deformity.  no ulcer on the feet.  feet are of normal color and temp.  1+ right pedal edema and 1+ left pedal edema.   Neurologic:  sensation is intact to touch on the feet  Additional Exam:  (pt says his a1c last week was in the 7's)  Impression & Recommendations:  Problem # 1:  DIABETES MELLITUS, TYPE II (ICD-250.00) needs increased rx  Medications Added to Medication List This Visit: 1)  Humulin N Pen 100 Unit/ml Susp (Insulin isophane human) .... 60 units qhs 2)  Humalog Kwikpen 100 Unit/ml Soln (Insulin lispro (human)) .... Three times a day (just before each meal) 60-60-70 units  Other Orders: Est. Patient Level III [id number])  Patient Instructions: 1)  increase humalog to (just before each meal) 60-60-70 10 units (the 10 is if you eat a bedtime snack.  i told pt there is no medical reason to eat a bedtime snack) 2)  continue nph 60 units at bedtime 3)  Please schedule a follow-up appointment in 3 months. Prescriptions: HUMULIN N PEN 100 UNIT/ML SUSP (INSULIN ISOPHANE HUMAN) 60 units qhs  #5 boxes x 3   Entered and Authorized by:   Donavan Foil MD   Signed by:   Donavan Foil MD on 11/03/2009   Method used:    Electronically to        [facility] (mail-order)             ,          Ph: [phone]       Fax: [phone]   [id number] HUMALOG KWIKPEN 100 UNIT/ML SOLN (INSULIN LISPRO (HUMAN)) three times a day (just before each meal) 60-60-70 units  #13 boxes x 3   Entered and Authorized by:   Donavan Foil MD   Signed by:   Donavan Foil MD on 11/03/2009   Method used:   Electronically to        [facility] (mail-order)             ,          Ph: [phone]       Fax: [phone]   [id number]

## 2010-11-09 NOTE — Progress Notes (Signed)
Summary: Lab Res  Phone Note Call from Patient Call back at [phone]   Summary of Call: Pt checking on lab results. Initial call taken by: Raymond Gurney,  July 03, 2010 3:15 PM  Follow-up for Phone Call        [place] [address] Called and spoke with patient regarding lab results.  Patient would like hard copy for his records.  Will complete letter and forward to Admin Follow-up by: Annabell Sabal MD,  July 04, 2010 11:40 AM

## 2010-11-09 NOTE — Progress Notes (Signed)
Phone Note Refill Request Call back at [phone]   Pt requesting all rxs except his insulin to be sent to [facility] for refilling.  Please contact pt for number to call so  that they can fax an auth for meds.  Pt would like this completed before end of 2011.  Initial call taken by: [name],  October 03, 2010 10:07 AM  Follow-up for Phone Call        Filled and placed in to be faxed box.      Prescriptions: SIMVASTATIN 40 MG TABS (SIMVASTATIN) Take 1 pill daily  #90 x 1   Entered and Authorized by:   Annabell Sabal MD   Signed by:   Annabell Sabal MD on 10/04/2010   Method used:   Printed then faxed to ...       [facility] (retail)       [address]       [place], [place]  [address]       Ph: [phone]       Fax: [phone]   RxID:   [id number] AMLODIPINE BESYLATE 10 MG TABS (AMLODIPINE BESYLATE) 1 tab by mouth daily for high blood pressure  #90 x 1   Entered and Authorized by:   Annabell Sabal MD   Signed by:   Annabell Sabal MD on 10/04/2010   Method used:   Printed then faxed to ...       [facility] [address] (retail)       [address]       [place], [place]  [address]       Ph: [phone]       Fax: [phone]   RxID:   [id number] ACCU-CHEK AVIVA  STRP (GLUCOSE BLOOD) use to check blood sugar up to 3 times a day  Disp: 1 box  #27mth x 11   Entered and Authorized by:   Annabell Sabal MD   Signed by:   Annabell Sabal MD on 10/04/2010   Method used:   Printed then faxed to ...       [facility]  [address] [address] (retail)       [address]       [place], [place]  [address]       Ph: [phone]       Fax: [phone]   RxID:   [id number] FUROSEMIDE 20 MG TABS (FUROSEMIDE) 1 tablet by mouth twice a day for edema  #90 x 1   Entered and Authorized by:   Annabell Sabal MD   Signed by:   Annabell Sabal MD on 10/04/2010   Method used:   Printed then faxed to ...       [facility]  [address]  (retail)       [address]       [place], [place]  [address]       Ph: [phone]       Fax: [phone]   RxID:   [id number] OMEPRAZOLE 20 MG CPDR (OMEPRAZOLE) 1 tab by mouth daily  #90 x 1   Entered and Authorized by:   Annabell Sabal MD   Signed by:   Annabell Sabal MD on 10/04/2010   Method used:   Printed then faxed to ...       [facility]  [address] [facility] (retail)       [address]       [place], [place]  [address]       Ph: [phone]       Fax: [phone]   RxID:   [id number] BUPROPION HCL 150 MG  TB24 (BUPROPION HCL) take one tablet by mouth daily  #90 x 1   Entered and Authorized by:   Annabell Sabal MD   Signed by:   Annabell Sabal MD on 10/04/2010   Method used:   Printed then faxed to ...       [facility]  [address] (retail)       [address]       [place], [place]  [address]       Ph: [phone]       Fax: [phone]   RxID:   [id number] [place] (LANCETS) check blood sugar up to 3 times a day Disp:  1 box  #2 x 3   Entered and Authorized by:   Annabell Sabal MD   Signed by:   Annabell Sabal MD on 10/04/2010   Method used:   Printed then faxed to ...       [facility]  [address] (retail)       [address]       [place], [place]  [address]       Ph: [phone]       Fax: [phone]   RxID:   [id number] EXEL PEN NEEDLES 1/3" 31G X 8 MM  MISC (INSULIN PEN NEEDLE) qid--any brand  #120 x 11   Entered and Authorized by:   Annabell Sabal MD   Signed by:   Annabell Sabal MD on 10/04/2010   Method used:   Printed then faxed to ...       [facility]  [address] (retail)       [address]       [place], [place]  [address]       Ph: [phone]       Fax: [phone]   RxID:   [id number] METOPROLOL SUCCINATE 200 MG XR24H-TAB (METOPROLOL SUCCINATE) one tablet by mouth daily  #90 x 1   Entered and Authorized by:   Annabell Sabal MD   Signed by:   Annabell Sabal MD on 10/04/2010   Method used:   Printed then faxed to ...       [facility]  [address] (retail)       [address]       [place], [place]  [address]       Ph: [phone]       Fax: [phone]   RxID:   [id number] ALTACE 10 MG  CAPS (RAMIPRIL) take 1 by mouth BID  #180 x 1   Entered and Authorized by:   Annabell Sabal MD   Signed by:   Annabell Sabal MD on 10/04/2010   Method used:   Printed then faxed to ...       [facility] (retail)       [address]       [place],   [address]       Ph: [phone]  Fax: [phone]   [id number] ZETIA 10 MG  TABS (EZETIMIBE) take 1 by mouth qd  #90 x 1   Entered and Authorized by:   Annabell Sabal MD   Signed by:   Annabell Sabal MD on 10/04/2010   Method used:   Printed then faxed to ...       [facility] (retail)       [address]       [place], [place]  [address]       Ph: [phone]       Fax: [phone]   RxID:   [id number]  Threw out Zetia, will not refill.  Continue Statin instead.  Annabell Sabal MD  October 04, 2010 4:27 PM

## 2010-11-09 NOTE — Progress Notes (Signed)
Summary: triage  Phone Note Call from Patient Call back at [phone]   Caller: Patient Summary of Call: Pt has cold and duaghter has strep and wondering if he can have an antibotic called in for him.  Pharmacy is [facility] [place].   Initial call taken by: Raymond Gurney,  November 30, 2009 10:40 AM  Follow-up for Phone Call        told him he will need an appt. he declined. states his throat is not that sore. urged him to call back if it worsens or he develps a high fever. he agreed Follow-up by: Elige Radon RN,  November 30, 2009 10:46 AM  Additional Follow-up for Phone Call Additional follow up Details #1::        agree with plan Additional Follow-up by: Dion Body  MD,  November 30, 2009 4:55 PM

## 2010-11-09 NOTE — Progress Notes (Signed)
Summary: Rx Req  Phone Note Refill Request Call back at [phone] Message from:  Patient  Refills Requested: Medication #1:  OMEPRAZOLE 20 MG CPDR 1 tab by mouth daily  Medication #2:  ZETIA 10 MG  TABS take 1 by mouth qd [facility].  Initial call taken by: Raymond Gurney,  July 03, 2010 3:14 PM    Both of the above medications have been faxed through [facility].  Thanks.  Annabell Sabal MD  July 03, 2010 7:46 PM

## 2010-11-09 NOTE — Progress Notes (Signed)
Summary: refill  Phone Note Refill Request Call back at [phone] Message from:  Patient  Refills Requested: Medication #1:  ACCU-CHEK AVIVA  STRP use to check blood sugar up to 3 times a day  [facility]  Initial call taken by: Audie Clear,  August 01, 2010 12:28 PM    Prescriptions: ACCU-CHEK MULTICLIX LANCETS  MISC (LANCETS) check blood sugar up to 3 times a day Disp:  1 box  #2 x 3   Entered and Authorized by:   Annabell Sabal MD   Signed by:   Annabell Sabal MD on 08/01/2010   Method used:   Electronically to        [facility] (retail)       [address]       [place], [place]  [address]       Ph: [phone]       Fax: [phone]   RxID:   [id number]

## 2010-11-09 NOTE — Letter (Signed)
Summary: [facility] [employer] [place]  [address]   [place], [place] [address]   Phone: [phone]  Fax: [phone]    October 25, 2009   Student:   Treven Pegan    To Whom It May Concern:   For Medical reasons, please excuse the above named student for being late for school  Start:   October 25, 2009     If you need additional information, please feel free to contact our office.   Sincerely,    Audelia Hives CMA    ****This is a legal document and cannot be tampered with.  Schools are authorized to verify all information and to do so accordingly.

## 2010-11-09 NOTE — Letter (Signed)
Summary: Generic Letter  [facility]  [address] [place], [place] [address]   Phone: [phone]  Fax: [phone]    11/03/2009  Aldair Brigante [address] [place], [place]  [address]  Dear Mr. Mcmanaway,  This is to note that you were seen in our office this am for a medical condition.    Sincerely,   Renato Shin MD

## 2010-11-09 NOTE — Miscellaneous (Signed)
Summary: furosemide via [facility]  Clinical Lists Changes  Medications: Changed medication from FUROSEMIDE 20 MG TABS (FUROSEMIDE) 1 tablet by mouth twice a day for edema Disp: 90 day supply to FUROSEMIDE 20 MG TABS (FUROSEMIDE) 1 tablet by mouth twice a day for edema - Signed Rx of FUROSEMIDE 20 MG TABS (FUROSEMIDE) 1 tablet by mouth twice a day for edema;  #90 x 0;  Signed;  Entered by: Dion Body  MD;  Authorized by: Dion Body  MD;  Method used: Printed then faxed to [facility] , ,   , Ph: [phone], Fax: [phone]    Prescriptions: FUROSEMIDE 20 MG TABS (FUROSEMIDE) 1 tablet by mouth twice a day for edema  #90 x 0   Entered and Authorized by:   Dion Body  MD   Signed by:   Dion Body  MD on 10/27/2009   Method used:   Printed then faxed to ...       [facility] (mail-order)             ,          Ph: [phone]       Fax: [phone]   RxID:   [id number]

## 2010-11-09 NOTE — Progress Notes (Signed)
  Phone Note Call from Patient Call back at Home Phone [phone]   Caller: Patient Call For: Dr Loanne Drilling Summary of Call: Pt called to say pharmacy only gave him 1 box of Humalin and Humalog rather than 1 mos supply. Pharmacy told pt it it was the way Dr wrote prescription,please advise. Initial call taken by: Denice Paradise,  August 08, 2010 11:10 AM  Follow-up for Phone Call        Per Janett Billow at pharmacy, pt was given 1 pen of Humalin N for a 25 day supply and Humolog need to be ordered to complete 1 month supply. Additional has since arrived and per pharmacy they will inform pt. Pt contacted and advised vai VM of same. Follow-up by: Crissie Sickles, CMA,  August 08, 2010 2:42 PM

## 2010-11-09 NOTE — Assessment & Plan Note (Signed)
Summary: HTN- not at goal; inc norvasc to 10mg    Vital Signs:  Patient profile:   50 year old male Height:      70 inches Weight:      313.8 pounds BMI:     45.19 Temp:     98.4 degrees F oral Pulse rate:   76 / minute BP sitting:   150 / 84  (right arm) Cuff size:   large  Vitals Entered By: Isabelle Course (November 22, 2009 9:39 AM)  Serial Vital Signs/Assessments:  Time      Position  BP       Pulse  Resp  Temp     By 9:44 AM             160/100                        Lattie Haw Klusman 10:00 AM            164/98                         Dion Body  MD  Comments: 9:44 AM re checked manually By: Isabelle Course  10:00 AM Sitting, left arm, manual By: Dion Body  MD   CC: f/u HTN Is Patient Diabetic? Yes Did you bring your meter with you today? No Pain Assessment Patient in pain? no        Primary Care Provider:  Dion Body  MD  CC:  f/u HTN.  History of Present Illness: 50yo M here for f/u HTN  HTN: No adverse effects from medications.  He was started on Amlodipine 5mg  at last visit.  Not checking it regularly but states it is improved during the times that he does check it. He has seen the systolics in the Q000111Q and diastolics in the Q000111Q.   Was not well controlled at last visit.  No dizziness, HA, CP, palpitations, or worsening swelling.   Habits & Providers  Alcohol-Tobacco-Diet     Tobacco Status: current     Other Tobacco snuff     Other per week 7  Current Medications (verified): 1)  Zetia 10 Mg  Tabs (Ezetimibe) .... Take 1 By Mouth Qd 2)  Altace 10 Mg  Caps (Ramipril) .... Take 1 By Mouth Bid 3)  Metoprolol Succinate 200 Mg Xr24h-Tab (Metoprolol Succinate) .... One Tablet By Mouth Daily 4)  Crestor 40 Mg  Tabs (Rosuvastatin Calcium) .... Take One Tablet By Mouth At Bedtime 5)  Exel Pen Needles 1/3" 31g X 8 Mm  Misc (Insulin Pen Needle) .... Qid--Any Brand 6)  Accu-Chek Multiclix Lancets  Misc (Lancets) .... Check Blood Sugar Up To 3  Times A Day Disp:  1 Box 7)  Bupropion Hcl 150 Mg  Tb24 (Bupropion Hcl) .... Take One Tablet By Mouth Daily 8)  Omeprazole 20 Mg Cpdr (Omeprazole) .Marland Kitchen.. 1 Tab By Mouth Daily 9)  Anacin 81 Mg  Tbec (Aspirin) .... Take One Tablet By Mouth Daily 10)  Humulin N Pen 100 Unit/ml Susp (Insulin Isophane Human) .... 60 Units Qhs 11)  Furosemide 20 Mg Tabs (Furosemide) .Marland Kitchen.. 1 Tablet By Mouth Twice A Day For Edema 12)  Accu-Chek Aviva  Strp (Glucose Blood) .... Use To Check Blood Sugar Up To 3 Times A Day  Disp: 1 Box 13)  Amlodipine Besylate 10 Mg Tabs (Amlodipine Besylate) .Marland Kitchen.. 1 Tab By Mouth Daily For High Blood Pressure 14)  Humalog Kwikpen 100  Unit/ml Soln (Insulin Lispro (Human)) .... Three Times A Day (Just Before Each Meal) 60-60-70 Units  Allergies (verified): 1)  ! Codeine 2)  Metformin Hcl  Social History: Smoking Status:  current  Review of Systems       No dizziness, HA, CP, palpitations, or worsening swelling.  Physical Exam  General:  VS Reviewed.Obese, but well appearing, NAD.  Lungs:  Normal respiratory effort, chest expands symmetrically. Lungs are clear to auscultation, no crackles or wheezes. Heart:  Normal rate and regular rhythm. S1 and S2 normal without gallop, murmur, click, rub or other extra sounds. Extremities:  compression stockings in place trace edema bilaterally in lower ext   Impression & Recommendations:  Problem # 1:  HYPERTENSION (ICD-401.9) Assessment Unchanged  Not at goal (<140/90) s/p addition of Norvasc 5mg .   Plan: Inc the norvasc to 10mg .  He is to check his BP and record it for the next week.  Call me next week and will make changes as needed.   His updated medication list for this problem includes:    Altace 10 Mg Caps (Ramipril) .Marland Kitchen... Take 1 by mouth bid    Metoprolol Succinate 200 Mg Xr24h-tab (Metoprolol succinate) ..... One tablet by mouth daily    Furosemide 20 Mg Tabs (Furosemide) .Marland Kitchen... 1 tablet by mouth twice a day for edema     Amlodipine Besylate 10 Mg Tabs (Amlodipine besylate) .Marland Kitchen... 1 tab by mouth daily for high blood pressure  Orders: [facility]- Est Level  3 (99213)  Complete Medication List: 1)  Zetia 10 Mg Tabs (Ezetimibe) .... Take 1 by mouth qd 2)  Altace 10 Mg Caps (Ramipril) .... Take 1 by mouth bid 3)  Metoprolol Succinate 200 Mg Xr24h-tab (Metoprolol succinate) .... One tablet by mouth daily 4)  Crestor 40 Mg Tabs (Rosuvastatin calcium) .... Take one tablet by mouth at bedtime 5)  Exel Pen Needles 1/3" 31g X 8 Mm Misc (Insulin pen needle) .... Qid--any brand 6)  Accu-chek Multiclix Lancets Misc (Lancets) .... Check blood sugar up to 3 times a day disp:  1 box 7)  Bupropion Hcl 150 Mg Tb24 (Bupropion hcl) .... Take one tablet by mouth daily 8)  Omeprazole 20 Mg Cpdr (Omeprazole) .Marland Kitchen.. 1 tab by mouth daily 9)  Anacin 81 Mg Tbec (Aspirin) .... Take one tablet by mouth daily 10)  Humulin N Pen 100 Unit/ml Susp (Insulin isophane human) .... 60 units qhs 11)  Furosemide 20 Mg Tabs (Furosemide) .Marland Kitchen.. 1 tablet by mouth twice a day for edema 12)  Accu-chek Aviva Strp (Glucose blood) .... Use to check blood sugar up to 3 times a day  disp: 1 box 13)  Amlodipine Besylate 10 Mg Tabs (Amlodipine besylate) .Marland Kitchen.. 1 tab by mouth daily for high blood pressure 14)  Humalog Kwikpen 100 Unit/ml Soln (Insulin lispro (human)) .... Three times a day (just before each meal) 60-60-70 units  Patient Instructions: 1)  Call me in 1 week to let me know what your blood pressure is measuring. 2)  Today we increased the amlodipine to 10mg .   Prescriptions: AMLODIPINE BESYLATE 10 MG TABS (AMLODIPINE BESYLATE) 1 tab by mouth daily for high blood pressure  #30 x 1   Entered and Authorized by:   Dion Body  MD   Signed by:   Dion Body  MD on 11/22/2009   Method used:   Electronically to        [facility]. [phone]* (retail)       [phone]  [address]       [place], [place]  [address]       Ph: [phone] or  [phone]       Fax: [phone]   RxID:   [id number]     Prevention & Chronic Care Immunizations   Influenza vaccine: Fluvax Non-MCR  (07/14/2008)    Tetanus booster: Not documented   Td booster deferral: Not indicated  (10/25/2009)   Tetanus booster due: 10/08/2013    Pneumococcal vaccine: Not documented  Other Screening   Smoking status: current  (11/22/2009)  Diabetes Mellitus   HgbA1C: 7.8  (10/25/2009)   HgbA1C action/deferral: Ordered  (10/25/2009)    Eye exam: Not documented    Foot exam: yes  (04/19/2008)   High risk foot: Not documented   Foot care education: Not documented    Urine microalbumin/creatinine ratio: Not documented  Lipids   Total Cholesterol: 187  (10/25/2009)   Lipid panel action/deferral: Lipid Panel ordered   LDL: 74  (10/25/2009)   LDL Direct: Not documented   HDL: 37  (10/25/2009)   Triglycerides: 380  (10/25/2009)    SGOT (AST): 43  (10/25/2009)   BMP action: Ordered   SGPT (ALT): 58  (10/25/2009)   Alkaline phosphatase: 111  (10/25/2009)   Total bilirubin: 0.5  (10/25/2009)  Hypertension   Last Blood Pressure: 150 / 84  (11/22/2009)   Serum creatinine: 1.40  (10/25/2009)   BMP action: Ordered   Serum potassium 4.8  (10/25/2009)    Hypertension flowsheet reviewed?: Yes   Progress toward BP goal: Unchanged  Self-Management Support :   Personal Goals (by the next clinic visit) :     Personal A1C goal: 8  (10/25/2009)     Personal blood pressure goal: 140/90  (10/25/2009)     Personal LDL goal: 100  (10/25/2009)    Patient will work on the following items until the next clinic visit to reach self-care goals:     Medications and monitoring: take my medicines every day, check my blood sugar, check my blood pressure, bring all of my medications to every visit  (11/22/2009)     Eating: drink diet soda or water instead of juice or soda, eat more vegetables, use fresh or frozen vegetables, eat foods that are low in salt, eat  baked foods instead of fried foods, eat fruit for snacks and desserts, limit or avoid alcohol  (11/22/2009)    Diabetes self-management support: CBG self-monitoring log, Written self-care plan, Education handout  (11/22/2009)   Diabetes care plan printed   Diabetes education handout printed    Hypertension self-management support: CBG self-monitoring log, Written self-care plan  (11/22/2009)   Hypertension self-care plan printed.    Lipid self-management support: Written self-care plan  (11/22/2009)   Lipid self-care plan printed.

## 2010-11-09 NOTE — Progress Notes (Signed)
Summary: 3 month supply of meds sent to [facility]  Phone Note Refill Request Call back at [phone] Message from:  Patient  Refills Requested: Medication #1:  ZETIA 10 MG  TABS take 1 by mouth qd  Medication #2:  ALTACE 10 MG  CAPS take 1 by mouth BID  Medication #3:  CRESTOR 40 MG  TABS take one tablet by mouth at bedtime  Medication #4:  BUPROPION HCL 150 MG  TB24 take one tablet by mouth daily Pt needs refill on all his meds he says.  He uses [facility], but can for just a 1 mo supply can they be sent into [facility] in [place]  Initial call taken by: Raymond Gurney,  October 27, 2009 10:20 AM  Follow-up for Phone Call        will forward to MD. Follow-up by: Marcell Barlow RN,  October 27, 2009 11:06 AM    New/Updated Medications: OMEPRAZOLE 20 MG CPDR (OMEPRAZOLE) 1 tab by mouth daily Prescriptions: OMEPRAZOLE 20 MG CPDR (OMEPRAZOLE) 1 tab by mouth daily  #90 x 0   Entered and Authorized by:   Dion Body  MD   Signed by:   Dion Body  MD on 10/27/2009   Method used:   Printed then faxed to ...       [facility] (mail-order)             ,          Ph: [phone]       Fax: [phone]   [id number] BUPROPION HCL 150 MG  TB24 (BUPROPION HCL) take one tablet by mouth daily  #90 x 0   Entered and Authorized by:   Dion Body  MD   Signed by:   Dion Body  MD on 10/27/2009   Method used:   Printed then faxed to ...       [facility]* (mail-order)             ,          Ph: [phone]       Fax: [phone]   [id number] CRESTOR 40 MG  TABS (ROSUVASTATIN CALCIUM) take one tablet by mouth at bedtime  #90 x 0   Entered and Authorized by:   Dion Body  MD   Signed by:   Dion Body  MD on 10/27/2009   Method used:   Printed then faxed to ...       [facility]* (mail-order)             ,          Ph: [phone]       Fax: [phone]   [id number] METOPROLOL SUCCINATE 200 MG XR24H-TAB (METOPROLOL SUCCINATE) one tablet by mouth daily  #90 x 0   Entered and Authorized by:   Dion Body  MD   Signed by:   Dion Body  MD on 10/27/2009   Method used:   Printed then faxed to ...       [facility]* (mail-order)             ,          Ph: [phone]       Fax: [phone]   [id number] ALTACE 10 MG  CAPS (RAMIPRIL) take 1 by mouth BID  #180 x 0   Entered and Authorized by:   Dion Body  MD  Signed by:   Dion Body  MD on 10/27/2009   Method used:   Printed then faxed to ...       [facility]* (mail-order)             ,          Ph: [phone]       Fax: [phone]   [id number] ZETIA 10 MG  TABS (EZETIMIBE) take 1 by mouth qd  #90 x 0   Entered and Authorized by:   Dion Body  MD   Signed by:   Dion Body  MD on 10/27/2009   Method used:   Printed then faxed to ...       [facility] (mail-order)             ,          Ph: [phone]       Fax: [phone]   RxID:   [id number]

## 2010-11-09 NOTE — Miscellaneous (Signed)
Summary: RX  Prescriptions: SIMVASTATIN 40 MG TABS (SIMVASTATIN) Take 1 pill daily  #30 x 3   Entered and Authorized by:   Annabell Sabal MD   Signed by:   Annabell Sabal MD on 09/16/2010   Method used:   Electronically to        [facility] (retail)       [address]       [place], [place]  [address]       Ph: [phone]       Fax: [phone]   RxID:   [id number]  Patient is wanting to know if there is a generic for Crestor or Zetia.  He says that the copays for these are over $100 each.  His insurance has changed.   His phone number is [phone]. Steven [name]  September 13, 2010 8:50 AM  Will need to change to Simvastatin instead as this is generic.  Attempted to call patient, left message.  Need to know what pharmacy to send prescription to.  Await call back.  Annabell Sabal MD  September 14, 2010 1:52 PM  Steven [name] called back to say uses [facility] [address] in [place].  Info has been updated in EMR pharmacy list. [name]  September 14, 2010 2:08 PM

## 2010-11-09 NOTE — Assessment & Plan Note (Signed)
Summary: f/up,tcb   Vital Signs:  Patient profile:   50 year old male Height:      70 inches Weight:      328 pounds BMI:     47.23 Temp:     98.9 degrees F oral Pulse rate:   84 / minute BP sitting:   167 / 81  (left arm) Cuff size:   large  Vitals Entered By: Enid Skeens, CMA (May 24, 2010 3:10 PM)   Serial Vital Signs/Assessments:  Time      Position  BP       Pulse  Resp  Temp     By 3:39 PM             127/77                         Annabell Sabal MD  CC: f/u BP, cholestrol Is Patient Diabetic? Yes Did you bring your meter with you today? No Pain Assessment Patient in pain? no        Primary Care Provider:  Annabell Sabal MD  CC:  f/u BP and cholestrol.  History of Present Illness: 50yo M here to f/u on chronic issues  HTN: No adverse effects from medication.  Not checking it regularly.  Was well controlled at last visit.  No dizziness, HA, CP, palpitations.  Improved swelling with use of compression stockings, exercise, and furosemide.  DM:  Currently on Humulin and Novolog.  No adverse effects.  Isolated hypoglycemic events when he doesn't eat.  No paresthesia or peripheral nerve pain.  CBGs checked 2-4 times a day and typically run b/w 130-170 with occasional readings up to 300.    HLD: Tolerating medication.  No adverse effects.  No muscle pain or abd pain.  Obesity:  Initially with some weight loss earlier this year but going through divorce now and gaining weight again.  Wants to lose weight because daughter is nagging him about his weight and fact he can't play with her.     Habits & Providers  Alcohol-Tobacco-Diet     Tobacco Status: never  Current Problems (verified): 1)  Diabetic Retinopathy  (ICD-250.50) 2)  Sleep Apnea  (ICD-780.57) 3)  Edema  (ICD-782.3) 4)  Depression  (ICD-311) 5)  Morbid Obesity  (ICD-278.01) 6)  Hyperlipidemia  (ICD-272.4) 7)  Hypertension  (ICD-401.9) 8)  Gerd  (ICD-530.81) 9)  Diabetes Mellitus, Type II   (ICD-250.00)  Current Medications (verified): 1)  Zetia 10 Mg  Tabs (Ezetimibe) .... Take 1 By Mouth Qd 2)  Altace 10 Mg  Caps (Ramipril) .... Take 1 By Mouth Bid 3)  Metoprolol Succinate 200 Mg Xr24h-Tab (Metoprolol Succinate) .... One Tablet By Mouth Daily 4)  Crestor 40 Mg  Tabs (Rosuvastatin Calcium) .... Take One Tablet By Mouth At Bedtime 5)  Exel Pen Needles 1/3" 31g X 8 Mm  Misc (Insulin Pen Needle) .... Qid--Any Brand 6)  Accu-Chek Multiclix Lancets  Misc (Lancets) .... Check Blood Sugar Up To 3 Times A Day Disp:  1 Box 7)  Bupropion Hcl 150 Mg  Tb24 (Bupropion Hcl) .... Take One Tablet By Mouth Daily 8)  Omeprazole 20 Mg Cpdr (Omeprazole) .Marland Kitchen.. 1 Tab By Mouth Daily 9)  Anacin 81 Mg  Tbec (Aspirin) .... Take One Tablet By Mouth Daily 10)  Humulin N Pen 100 Unit/ml Susp (Insulin Isophane Human) .... 60 Units Qhs 11)  Furosemide 20 Mg Tabs (Furosemide) .Marland Kitchen.. 1 Tablet By Mouth Twice A Day  For Edema 12)  Accu-Chek Aviva  Strp (Glucose Blood) .... Use To Check Blood Sugar Up To 3 Times A Day  Disp: 1 Box 13)  Amlodipine Besylate 10 Mg Tabs (Amlodipine Besylate) .Marland Kitchen.. 1 Tab By Mouth Daily For High Blood Pressure 14)  Humalog Kwikpen 100 Unit/ml Soln (Insulin Lispro (Human)) .... Three Times A Day (Just Before Each Meal) 60-60-70 Units  Allergies (verified): 1)  ! Codeine 2)  Metformin Hcl  Past History:  Past medical, surgical, family and social histories (including risk factors) reviewed, and no changes noted (except as noted below).  Past Medical History: Reviewed history from 10/25/2009 and no changes required. Diabetes mellitus, type II GERD Hypertension Hyperlipidemia Morbid Obesity Depression Diabetic retinopathy OSA  Past Surgical History: Reviewed history from 02/19/2008 and no changes required. Appendectomy- 2007  Family History: Reviewed history from 02/19/2008 and no changes required. Mother- DM II Father- DM II, deceased, CHF, CAD Sister- DM II, morbidly  obese 22 brother- 57yo newly dx leukemia  Social History: Reviewed history from 10/25/2009 and no changes required. Separated from wife, custody of daughter three days a week.  Lives in [place].  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  No tobacco use.  Rarely drinks EtOH.  No drugs.  Cycling on the weekends.  [employer] Best contact # [phone] (cell)Smoking Status:  never  Review of Systems       ROS:  no headaches, pre-syncopal or syncopal episodes, chest pain, palpitations, shortness of breath or dyspnea, abdominal pain, diarrhea or constipation, melena, hematochezia.    Physical Exam  General:  Vital signs reviewed, WNL on recheck Well-developed, well-nourished patient in NAD.  Awake and cooperative  Head:  normocephalic and atraumatic.   Eyes:  vision grossly intact, pupils equal, pupils round, and pupils reactive to light.   Mouth:  good dentition.  oral mucosa moist Lungs:  clear to auscultation bilaterally without wheezing, rales, or rhonchi.  Normal work of breathing  Heart:  Regular rate and rhythm without murmur, rub, or gallop.  Normal S1/S2  Abdomen:  soft/nontender/nondistended.  No organomegaly, bowel sounds present.  Extremities:  2+ left pedal edema bilaterally  Neurologic:  No focal deficits    Impression & Recommendations:  Problem # 1:  HYPERTENSION (ICD-401.9) Assessment Improved WNL on re-check once in room.  No need to change any medications today, will may be able to decrease in the future if he continues with weight loss.   His updated medication list for this problem includes:    Altace 10 Mg Caps (Ramipril) .Marland Kitchen... Take 1 by mouth bid    Metoprolol Succinate 200 Mg Xr24h-tab (Metoprolol succinate) ..... One tablet by mouth daily    Furosemide 20 Mg Tabs (Furosemide) .Marland Kitchen... 1 tablet by mouth twice a day for edema    Amlodipine Besylate 10 Mg Tabs (Amlodipine besylate) .Marland Kitchen... 1 tab by mouth daily for high blood pressure  Orders: Comp Met-[facility]  [id number]) A1C-[facility] [id number]) CBC-[facility] [id number]) Lipid-[facility] [id number]) [facility]- Est Level  3 [id number])  Problem # 2:  DIABETES MELLITUS, TYPE II (ICD-250.00) Assessment: Unchanged Will assess A1C at next visit.  Patient states his blood sugars run 120-170s at home.  Knows his regimen well.  Weight loss will help, see below.   His updated medication list for this problem includes:    Altace 10 Mg Caps (Ramipril) .Marland Kitchen... Take 1 by mouth bid    Anacin 81 Mg Tbec (Aspirin) .Marland Kitchen... Take one tablet by mouth daily    Humulin N Pen 100 Unit/ml Susp (  Insulin isophane human) .Marland KitchenMarland KitchenMarland KitchenMarland Kitchen 60 units qhs    Humalog Kwikpen 100 Unit/ml Soln (Insulin lispro (human)) .Marland Kitchen... Three times a day (just before each meal) 60-60-70 units  Problem # 3:  HYPERLIPIDEMIA (ICD-272.4) Assessment: Unchanged Tolerating medications well.  Obtain lipid panel, will adjust as needed.   His updated medication list for this problem includes:    Zetia 10 Mg Tabs (Ezetimibe) .Marland Kitchen... Take 1 by mouth qd    Crestor 40 Mg Tabs (Rosuvastatin calcium) .Marland Kitchen... Take one tablet by mouth at bedtime  Orders: Comp Met-[facility] [id number]) A1C-[facility] [id number]) CBC-[facility] [id number]) Lipid-[facility] HW:[id number])  Problem # 4:  MORBID OBESITY (ICD-278.01) Assessment: Deteriorated Continuing to gain weight over stress at end of his marriage.  States his one motivation is his daughter.  Unable to pay for gastric bypass or lap band due to insurance issues, plans to change insurance for next year to cover for it.  Discussed that exercise is cumulative, a little bit here and there adds up.  Also discussed food choices and need to limit processed foods.  Will follow  Orders: [facility]- Est Level  3 [id number])  Complete Medication List: 1)  Zetia 10 Mg Tabs (Ezetimibe) .... Take 1 by mouth qd 2)  Altace 10 Mg Caps (Ramipril) .... Take 1 by mouth bid 3)  Metoprolol Succinate 200 Mg Xr24h-tab (Metoprolol succinate) .... One tablet by mouth daily 4)  Crestor 40 Mg Tabs (Rosuvastatin calcium)  .... Take one tablet by mouth at bedtime 5)  Exel Pen Needles 1/3" 31g X 8 Mm Misc (Insulin pen needle) .... Qid--any brand 6)  Accu-chek Multiclix Lancets Misc (Lancets) .... Check blood sugar up to 3 times a day disp:  1 box 7)  Bupropion Hcl 150 Mg Tb24 (Bupropion hcl) .... Take one tablet by mouth daily 8)  Omeprazole 20 Mg Cpdr (Omeprazole) .Marland Kitchen.. 1 tab by mouth daily 9)  Anacin 81 Mg Tbec (Aspirin) .... Take one tablet by mouth daily 10)  Humulin N Pen 100 Unit/ml Susp (Insulin isophane human) .... 60 units qhs 11)  Furosemide 20 Mg Tabs (Furosemide) .Marland Kitchen.. 1 tablet by mouth twice a day for edema 12)  Accu-chek Aviva Strp (Glucose blood) .... Use to check blood sugar up to 3 times a day  disp: 1 box 13)  Amlodipine Besylate 10 Mg Tabs (Amlodipine besylate) .Marland Kitchen.. 1 tab by mouth daily for high blood pressure 14)  Humalog Kwikpen 100 Unit/ml Soln (Insulin lispro (human)) .... Three times a day (just before each meal) 60-60-70 units  Patient Instructions: 1)  Make an appt to see me in 3 months. 2)  Keep working hard on weight loss. Everything adds up! 3)  Keep working hard at cutting out processed foods because of their added salts.   Prescriptions: AMLODIPINE BESYLATE 10 MG TABS (AMLODIPINE BESYLATE) 1 tab by mouth daily for high blood pressure  #30 Tablet x 0   Entered and Authorized by:   Annabell Sabal MD   Signed by:   Annabell Sabal MD on 05/24/2010   Method used:   Electronically to        [facility] (retail)       [address]       [place], [place]  [address]       Ph: [phone]       Fax: [phone]   RxID:   [id number] FUROSEMIDE 20 MG TABS (FUROSEMIDE) 1 tablet by mouth twice a day for edema  #  62 x 0   Entered and Authorized by:   Annabell Sabal MD   Signed by:   Annabell Sabal MD on 05/24/2010   Method used:   Electronically to        [facility] (retail)       [address]       [place], [place]   [address]       Ph: [phone]       Fax: [phone]   RxID:   [id number] ANACIN 81 MG  TBEC (ASPIRIN) take one tablet by mouth daily  #31 x 0   Entered and Authorized by:   Annabell Sabal MD   Signed by:   Annabell Sabal MD on 05/24/2010   Method used:   Electronically to        [facility] (retail)       [address]       [place], [place]  [address]       Ph: [phone]       Fax: [phone]   RxID:   [id number] OMEPRAZOLE 20 MG CPDR (OMEPRAZOLE) 1 tab by mouth daily  #31 x 0   Entered and Authorized by:   Annabell Sabal MD   Signed by:   Annabell Sabal MD on 05/24/2010   Method used:   Electronically to        [facility] (retail)       [address]       [place], [place]  [address]       Ph: [phone]       Fax: [phone]   RxID:   [id number] BUPROPION HCL 150 MG  TB24 (BUPROPION HCL) take one tablet by mouth daily  #31 x 0   Entered and Authorized by:   Annabell Sabal MD   Signed by:   Annabell Sabal MD on 05/24/2010   Method used:   Electronically to        [facility] (retail)       [address]       [place], [place]  [address]       Ph: [phone]       Fax: [phone]   RxID:   [id number] CRESTOR 40 MG  TABS (ROSUVASTATIN CALCIUM) take one tablet by mouth at bedtime  #30 x 0   Entered and Authorized by:   Annabell Sabal MD   Signed by:   Annabell Sabal MD on 05/24/2010   Method used:   Electronically to        [facility] (retail)       [address]       [place], [place]  [address]       Ph: [phone]       Fax: [phone]   RxID:   [id number] METOPROLOL SUCCINATE 200 MG XR24H-TAB (METOPROLOL SUCCINATE) one tablet by mouth daily  #30 x 0   Entered and Authorized by:   Annabell Sabal MD   Signed by:   Annabell Sabal MD on 05/24/2010   Method used:   Electronically to        [facility] (retail)       [address]  [place], [place]  [address]       Ph: [phone]       Fax: [phone]   [id number] ALTACE 10 MG  CAPS (RAMIPRIL) take 1 by mouth BID  #62 x 0   Entered and Authorized by:   Annabell Sabal MD   Signed by:   Annabell Sabal MD on 05/24/2010   Method used:   Electronically to        [facility] (retail)       [address]       [place], [place]  [address]       Ph: [phone]       Fax: [phone]   RxID:   [id number] ZETIA 10 MG  TABS (EZETIMIBE) take 1 by mouth qd  #31 x 0   Entered and Authorized by:   Annabell Sabal MD   Signed by:   Annabell Sabal MD on 05/24/2010   Method used:   Electronically to        [facility] (retail)       [address]       [place], [place]  [address]       Ph: [phone]       Fax: [phone]   RxID:   [id number]

## 2010-11-15 NOTE — Progress Notes (Signed)
Summary: Rx  Phone Note Refill Request Call back at Home Phone [phone]   pt needs 7 day refill on all 7 meds to give him enough time for mail order to send out his shipment  Initial call taken by: Samara Snide,  November 06, 2010 11:03 AM  Follow-up for Phone Call        will forward to MD. tried to call patient to make sure of pharmacy . not home . Looks like in the past it has been [facility] , [address] Linna Hoff. Marcell Barlow RN  November 06, 2010 11:33 AM  Follow-up by: Marcell Barlow RN,  November 06, 2010 11:31 AM  Additional Follow-up for Phone Call Additional follow up Details #1::        Done.   Additional Follow-up by: Annabell Sabal MD,  November 06, 2010 11:45 AM    Prescriptions: SIMVASTATIN 40 MG TABS (SIMVASTATIN) Take 1 pill daily  #7 x 0   Entered and Authorized by:   Annabell Sabal MD   Signed by:   Annabell Sabal MD on 11/06/2010   Method used:   Electronically to        [facility] (retail)       [address]       [place], [place]  [address]       Ph: [phone]       Fax: [phone]   RxID:   [id number] AMLODIPINE BESYLATE 10 MG TABS (AMLODIPINE BESYLATE) 1 tab by mouth daily for high blood pressure  #7 x 0   Entered and Authorized by:   Annabell Sabal MD   Signed by:   Annabell Sabal MD on 11/06/2010   Method used:   Electronically to        [facility] [address] (retail)       [address]       [place], [place]  [address]       Ph: [phone]       Fax: [phone]   RxID:   [id number] FUROSEMIDE 20 MG TABS (FUROSEMIDE) 1 tablet by mouth twice a day for edema  #14 x 0   Entered and Authorized by:   Annabell Sabal MD   Signed by:   Annabell Sabal MD on 11/06/2010   Method used:   Electronically to        [facility] (retail)       [address]       [place], [place]  [address]       Ph: [phone]       Fax: [phone]   RxID:    [id number] OMEPRAZOLE 20 MG CPDR (OMEPRAZOLE) 1 tab by mouth daily  #7 x 0   Entered and Authorized by:   Annabell Sabal MD   Signed by:   Annabell Sabal MD on 11/06/2010   Method used:   Electronically to        [facility] (retail)       [address]       [place], [place]  [address]       Ph: [phone]       Fax: [phone]   RxID:   [id number] BUPROPION HCL 150 MG  TB24 (BUPROPION HCL) take one tablet by mouth daily  #  7 x 0   Entered and Authorized by:   Annabell Sabal MD   Signed by:   Annabell Sabal MD on 11/06/2010   Method used:   Electronically to        [facility] (retail)       [address]       [place], [place]  [address]       Ph: [phone]       Fax: [phone]   RxID:   [id number] METOPROLOL SUCCINATE 200 MG XR24H-TAB (METOPROLOL SUCCINATE) one tablet by mouth daily  #7 x 0   Entered and Authorized by:   Annabell Sabal MD   Signed by:   Annabell Sabal MD on 11/06/2010   Method used:   Electronically to        [facility] (retail)       [address]       [place], [place]  [address]       Ph: [phone]       Fax: [phone]   RxID:   [id number] ALTACE 10 MG  CAPS (RAMIPRIL) take 1 by mouth BID  #14 x 0   Entered and Authorized by:   Annabell Sabal MD   Signed by:   Annabell Sabal MD on 11/06/2010   Method used:   Electronically to        [facility] (retail)       [address]       [place], [place]  [address]       Ph: [phone]       Fax: [phone]   RxID:   [id number]

## 2011-01-31 ENCOUNTER — Other Ambulatory Visit: Payer: Self-pay | Admitting: Endocrinology

## 2011-02-20 NOTE — Procedures (Signed)
NAMESHYAN, Steven [name]              ACCOUNT NO.:  [id number]   MEDICAL RECORD NO.:  [id number]          PATIENT TYPE:  OUT   LOCATION:  SLEEP CENTER                 FACILITY:  [facility]   PHYSICIAN:  Clinton D. Annamaria Boots, MD, FCCP, FACPDATE OF BIRTH:  30-May-1962   DATE OF STUDY:  02/07/2009                            NOCTURNAL POLYSOMNOGRAM   REFERRING PHYSICIAN:  Dion Body, MD   INDICATION FOR STUDY:  Hypersomnia with sleep apnea.   EPWORTH SLEEPINESS SCORE:  Epworth sleepiness score 20/24.  BMI 42.5,  weight 305 pounds, height 71 inches, and neck 20 inches.   HOME MEDICATIONS:  Charted and reviewed.   SLEEP ARCHITECTURE:  Total sleep time 264 minutes with sleep efficiency  71.3%.  Stage I was 18.1%, stage II 71.8%, stage III absent, REM 10% of  total sleep time.  Sleep latency 14 minutes, REM latency 54 minutes,  awake after sleep onset 93 minutes, arousal index 41.7 indicating  increased EEG arousal.  No bedtime medication was taken.   RESPIRATORY DATA:  Diagnostic NPSG protocol ordered.  Apnea/hypopnea  index (AHI) 65.6 per hour.  A total of 289 events was scored including  103 obstructive apneas, 3 central apneas, and 13 mixed apneas.  Events  were not positional.  REM AHI 83.8 per hour.   OXYGEN DATA:  Very loud snoring with oxygen desaturation to a nadir of  48%.  Mean oxygen saturation through the study was 87.1% on room air.  A  total of 106.9 minutes was spent with oxygen saturation less than 88% on  room air.   CARDIAC DATA:  Normal sinus rhythm.   MOVEMENT/PARASOMNIA:  No significant movement disturbance.  Bathroom x1.   IMPRESSION/RECOMMENDATION:  1. Severe obstructive sleep apnea/hypopnea syndrome, AHI 65.6 per hour      with non-positional events, very loud snoring, and significant      oxygen desaturation to a nadir of 48%, mean of 87.1% on room air.  2. The study was ordered as a diagnostic nocturnal polysomnogram      study.  The patient met sleep center  protocol requirements to      initiate CPAP titration on an emergent basis because of the      severity of apnea and associated desaturation.  The technician      placed a medium ResMed Quattro full-face mask with heated      humidifier after trying several others.  Although he tolerated the      mask fitting, he had a panic attack and requested CPAP trial be      discontinued.  The study was completed as a diagnostic NPSG      protocol without CPAP.  3. Mean oxygen saturation on room air through the study was 87.1%.  A      total of 106.9 minutes was recorded      with room air saturation less than 88%.  Consider returning for      CPAP titration.  At a minimum, consider evaluating for home oxygen      during sleep.      Clinton D. Annamaria Boots, MD, FCCP, FACP  Diplomate, Tax adviser of Sleep Medicine  Electronically  Signed     CDY/MEDQ  D:  02/19/2009 12:33:37  T:  02/20/2009 [id number]  Job:  [id number]

## 2011-02-20 NOTE — Procedures (Signed)
Steven [name], Steven [name]              ACCOUNT NO.:  [id number]   MEDICAL RECORD NO.:  [id number]          PATIENT TYPE:  OUT   LOCATION:  SLEEP CENTER                 [facility]AC[facility]   PHYSICIAN:  Clinton D. Annamaria Boots, MD, FCCP, FACPDATE OF BIRTH:  [birth year] 01[birth year] 1962   DATE OF STUDY:  05/24/2009                            NOCTURNAL POLYSOMNOGRAM   REFERRING PHYSICIAN:  Dion Body, MD   INDICATION FOR STUDY:  Hypersomnia with sleep apnea.   EPWORTH SLEEPINESS SCORE:  Epworth sleepiness score 20/24, BMI 42.5.  Weight 305 pounds, height 71 inches.  Neck 20.5 inches.   MEDICATIONS:  Home medications charted and reviewed.  The diagnostic  NPSG on Feb 07, 2009, gave an AHI of 65.6 per hour.  CPAP titration is  requested.   SLEEP ARCHITECTURE:  Total sleep time 370 minutes with sleep efficiency  92.4%.  Stage I was 2.6%, stage II 51.2%, stage III absent, REM 46.2% of  total sleep time.  Sleep latency 16 minutes, REM latency 31 minutes,  awake after sleep onset 14 minutes, arousal index 8.1.  No bedtime  medication was taken.   RESPIRATORY DATA:  CPAP titration protocol.  CPAP was titrated to 19  CWP, AHI zero per hour.  He wore a medium ResMed Mirage Quattro mask  with heated humidifier.   OXYGEN DATA:  Snoring was prevented and mean oxygen saturation on CPAP  held at 94.3% on room air.   CARDIAC DATA:  Normal sinus rhythm.   MOVEMENT-PARASOMNIA:  No significant movement disturbance.  No bathroom  trips.   IMPRESSIONS-RECOMMENDATIONS:  1. Sleep architecture was remarkable for increased percentage REM      consistent with REM rebound in response to initiation of CPAP      therapy.  2. Successful CPAP titration to 19 CWP, AHI zero per hour.  He wore a      medium ResMed Mirage Quattro mask      with heated humidifier.  3. Baseline diagnostic NPSG on Feb 07, 2009, had recorded an HPI of      65.6 per hour.      Clinton D. Annamaria Boots, MD, [facility], FACP  Diplomate, Tax adviser of Sleep  Medicine  Electronically Signed     CDY/MEDQ  D:  05/28/2009 12:07:02  T:  05/29/2009 05:31:57  Job:  [id number]

## 2011-02-23 NOTE — Discharge Summary (Signed)
[name], [name]              ACCOUNT NO.:  [id number]   MEDICAL RECORD NO.:  [id number]          PATIENT TYPE:  INP   LOCATION:  5730                         FACILITY:  [facility]   PHYSICIAN:  Sammuel Hines. Marlou Starks, M.D.     DATE OF BIRTH:  Sep 11, 1961   DATE OF ADMISSION:  11/30/2004  DATE OF DISCHARGE:  12/07/2004                                 DISCHARGE SUMMARY   DISCHARGE DIAGNOSES:  1.  Appendicitis, status post open appendectomy by Dr. Armandina Gemma.  2.  Renal insufficiency with acute exacerbation secondary to dehydration.  3.  Diabetes mellitus.  4.  Hypertension.  5.  Hypercholesterolemia.   HOSPITAL COURSE:  [name] is a 50 year old male patient who developed  periumbilical pain two days prior to admission and then gradually migrated  to his right lower quadrant.  This was associated with nausea and anorexia.  His primary care physician sent him to have a CT scan that was consistent  with acute appendicitis.   He was taken that evening to the operating room and a laparoscopic  appendectomy was attempted but the procedure ended up being an open  appendectomy secondary to a retrocecal appendix.  The patient tolerated the  procedure well and was taken back to the post anesthesia care unit in stable  condition.  A 19 French Blake drain was placed in the retrocecal cavity and  this was discontinued just prior to discharge.   Postoperatively the patient was noted to have a creatinine elevated to 3.0  and this was felt to be secondary to dehydration.  It was felt he had a  baseline creatinine of around 1.6.  At this point we have stopped his Altace  and metformin due to these reasons.  We will ask him to follow back up with  his primary care physician, Dr. Zella Richer. Massey at [facility] for  further control.  In the meantime he is to go home on the following  medications:   CURRENT MEDICATIONS:  1.  Lantus 38 units subcutaneously q.a.m.  2.  Zetia 10 mg daily.  3.  Zocor  daily.  4.  Glucotrol 10 mg p.o. b.i.d.  5.  Actos 15 mg daily.  6.  Multivitamin daily.  7.  Enteric coated aspirin 325 mg daily.  8.  Atenolol 50 mg p.o. b.i.d.  9.  Vicodin 5/500 one to two tablets q.6h. as needed for pain.   ACTIVITY:  No lifting over 10 pounds.   DIET:  He is to remain on a low fat, diabetic diet.   WOUND CARE:  He may clean the site with soap and water.  He may remove his  bandage in two days.  He is to call for questions or concerns at [phone].   FOLLOW UP:  A follow up appointment will be made for him to see Dr. Harlow Asa.      LB/MEDQ  D:  12/07/2004  T:  12/07/2004  Job:  [id number]   cc:   Zella Richer. Burnett Harry, M.D.  [place]  [place] [address]  Fax: [phone]

## 2011-02-23 NOTE — Op Note (Signed)
Steven [name], Steven [name]              ACCOUNT NO.:  [id number]   MEDICAL RECORD NO.:  [id number]          PATIENT TYPE:  INP   LOCATION:  [id number]                         FACILITY:  [facility]   PHYSICIAN:  Earnstine Regal, MD      DATE OF BIRTH:  May 22, 1961   DATE OF PROCEDURE:  11/30/2004  DATE OF DISCHARGE:                                 OPERATIVE REPORT   PREOPERATIVE DIAGNOSIS:  Acute appendicitis.   POSTOPERATIVE DIAGNOSIS:  Acute retrocecal appendicitis.   PROCEDURES:  1.  Attempted laparoscopic appendectomy.  2.  Open appendectomy.   SURGEON:  Earnstine Regal, M.D.   ANESTHESIA:  General per Dr. Lorrene Reid.   ESTIMATED BLOOD LOSS:  150 mL.   PREPARATION:  Betadine.   COMPLICATIONS:  None.   INDICATIONS:  The patient is a 50 year old white male who presents on  referral from Dr. Jacqulyn Cane with abdominal pain for 48 hours.  He  developed loss of appetite.  Pain was initially epigastric and then  localized to the right lower quadrant.  CT scan of the abdomen and pelvis  was obtained at the request of the primary care physician.  This  demonstrated findings consistent with acute appendicitis.  General surgery  was called and the patient was seen and evaluated and prepared for the  operating room.   BODY OF REPORT:  The procedure was done in OR number 16 at the [facility].  The patient is brought to the operating room, placed in  supine position on the operating room table.  Following administration of  general anesthesia, the patient is prepped and draped in usual strict  aseptic fashion.  After ascertaining that an adequate level of anesthesia been obtained, a  supraumbilical incision was made with #15 blade.  Dissection was carried  down through subcutaneous tissues to the fascia.  Fascia is incised in the  midline.  The peritoneal cavity was entered cautiously.  A 0 Vicryl  pursestring suture is placed in the fascia.  Hasson cannula was introduced  under direct vision and secured with the pursestring suture.  Abdomen was  insufflated with carbon dioxide.  A laparoscope was introduced  and the  abdomen explored.  Due to the patient's large body habitus and copious intra-  abdominal adipose tissue, visualization was exceedingly difficult.  A port  was placed in the right upper quadrant.  Using a grasping clamp, the omentum  was mobilized off of the right colon so that the anterior wall of the right  colon could be visualized.  Despite multiple attempts at repositioning, it  was impossible to find the base of the cecum.  The appendix was not visible  in any form.  There was cloudy fluid present in the right lower quadrant.   Laparoscopic appendectomy attempts were discontinued.  Ports were removed.  The 0 Vicryl pursestring suture was tied securely.  Instruments were changed  out and prepared for open laparotomy.   After ascertaining that an adequate level of anesthesia had been maintained,  a right lower quadrant McBurney incision was made with a number  15 blade.  Dissection was carried down through the subcutaneous tissues.  Hemostasis is  obtained with the electrocautery.  Dissection was carried down to the  abdominal wall.  External oblique fascia was incised in line of its fibers  and extended onto the anterior rectus sheath.  The lateral edge of the  rectus muscle was divided.  Posterior layers of fascia were divided and the  peritoneal cavity was entered cautiously.  Abdomen is explored.  There was a  dense inflammatory mass tracking along the posterolateral aspect of the  colon.  This represents a retrocecal appendix.  With gentle blunt  dissection, the lateral peritoneal reflection was opened.  The terminal  ileum was mobilized.  The cecum was mobilized.  The harmonic scalpel was  used for hemostasis as well as large yellow Ligaclips.  The appendix tracks  cephalad up to the level of the hepatic flexure.  This was all gently   dissected out with blunt digital dissection.  The appendix after a difficult  hand dissection was delivered to the wound.  The remaining mesoappendix was  divided between big yellow Ligaclips.  The base of the appendix was visible.  It was ligated with a 2-0 silk ligature and transected.  The stump of the  appendix was cauterized with electrocautery.  Appendix was passed off the  field and submitted to pathology for review.  It is markedly indurated and  swollen at the tip.  Retroperitoneum was irrigated copiously with warm  saline.  Good hemostasis was noted.  A 19-French Blake drain was placed in  the retrocecal cavity.  It is brought out through a stab wound in the right  lower quadrant and secured to the skin with a 2-0 nylon suture.  The  peritoneal cavity is irrigated with warm saline, which was evacuated.  Omentum was used to cover the small bowel.  Abdominal wall was closed in  layers.  The peritoneum was closed with a running 0 Vicryl suture.  The  internal oblique fascia was closed with a running 0 Vicryl suture.  The  anterior rectus sheath and external oblique fascia are closed with running  #1 Vicryl suture.  Subcutaneous tissue is irrigated, hemostasis obtained  with electrocautery.  Skin of all incisions is closed with stainless steel  staples.  The drain is placed to bulb suction.  Wounds are washed and dried  and sterile dressings were applied.  The patient is awakened from anesthesia  and brought to the recovery room in stable condition.  The patient tolerated  the procedure well.      TMG/MEDQ  D:  11/30/2004  T:  12/01/2004  Job:  [id number]   cc:   Zella Richer. Burnett Harry, M.D.  [place]  [place] [address]  Fax: [phone]

## 2011-02-23 NOTE — Consult Note (Signed)
Steven [name], KALMBACH              ACCOUNT NO.:  [id number]   MEDICAL RECORD NO.:  [id number]          PATIENT TYPE:  INP   LOCATION:  [id number]                         FACILITY:  [facility]   PHYSICIAN:  Lanney Gins, MD    DATE OF BIRTH:  03-01-1961   DATE OF CONSULTATION:  12/01/2004  DATE OF DISCHARGE:                                   CONSULTATION   REASON FOR CONSULT:  Acute rise in creatinine, post open appendectomy.   CONSULTING PHYSICIAN:  Georganna Skeans, M.D.   RENAL ATTENDING CONSULTING ON THIS CASE:  Roney Jaffe, M.D.   HPI:  Mr. Sane is a 50 year old Caucasian gentleman with a past medical  history significant for diabetes, hypertension, hyperlipidemia, morbid  obesity who presented on November 30, 2004, with a 2 day history of  periumbilical pain and found to have CT evidence of acute appendicitis.  He  subsequently underwent open appendectomy after failed attempt at a  laparoscopic appendectomy and tolerated the procedure well.  One day postop  he was noted to have a creatinine of 3.0 and on repeat check 3.2, which was  a rise from his admission creatinine of 1.6.  Anesthesia records reveal some  transient relative hypotension during surgery but no significant fluid or  blood loss was noted.   ALLERGIES:  Some intolerance to codeine.   PAST MEDICAL HISTORY INCLUDES:  1.  Diabetes x14 years, recently well controlled with A1c ranging between      6.8 and 10 per patient.  2.  Longstanding hypertension x14 years, now well controlled.  3.  Hyperlipidemia.  4.  Morbid obesity.   PAST SURGICAL HISTORY:  Only for appendectomy performed during this  hospitalization.   MEDICATIONS IN HOSPITAL INCLUDE:  1.  Unasyn 3 g IV q.6 hours.  2.  Atenolol 25 mg p.o. daily.  3.  Sliding scale insulin.  4.  Tylenol, Vicodin, morphine and Phenergan all p.r.n.   SOCIAL HISTORY:  Patient lives in [place] and is married and has one  daughter.  Denies any smoking history but does  use smokeless tobacco.  He  admits to rare alcohol use and denies any IV or illicit substance use.   FAMILY HISTORY:  He has a mother who is still living who has suffered a PE  secondary to a DVT.  Father who is alive who had coronary artery disease,  status post CABG as well as longstanding diabetes with subsequent  amputation.   PHYSICAL EXAM:  VITAL SIGNS DURING CONSULTATION:  Temperature 101.0, pulse  105-110, blood pressure 100/55, respirations 12, O2 sats 96% on 2 liters.  GENERAL:  The patient is a pleasant, morbidly obese Caucasian gentleman in  no acute distress.  HEENT:  Normocephalic, atraumatic.  Pupils equal, round and reactive to  light.  Extraocular muscles intact.  Oropharynx without erythema or exudate.  Mucous membranes noted to be dry.  NECK:  Was obese which made it difficult to appreciate any significant JVD  but no elevation in JVD apparent. No carotid bruits appreciated either.  CARDIOVASCULAR EXAM:  Revealed an increased rate, regular rhythm with  distant S1,  S2.  PULMONARY EXAM:  Revealed clear to auscultation bilaterally.  No rales,  rhonchi or wheezes.  ABDOMINAL EXAM:  Obese, soft, somewhat distended with bandages in place in  the midline as well as in the right middle quadrant and significant large  band in the right lower quadrant with a JP drain in place.  He had  hypoactive bowel sounds.  EXTREMITIES:  Revealed no significant edema.  NEUROLOGIC EXAM:  The patient was alert and oriented x3 with no focal  deficits noted.   LABORATORY VALUES:  Today, include a hemoglobin 10.4, hematocrit 29.5, white  blood cell count 7.8, platelets 234.  Basic metabolic panel: Sodium Q000111Q,  potassium 4.5, chloride 105, bicarb 21, BUN 32, creatinine 3.2, blood  glucose 148.  AST, ALT 17 and 32 respectively, alkaline phosphatase 54,  total bilirubin 0.8, these were on admission __________with a total protein  17.0, albumin 3.7, calcium 9.3.  Urine output has been recorded  as 160 mL  over the last 4 hours, which is approximately greater than 30 mL hour, and  had a CT with IV and oral contrast to evaluate for appendicitis on  admission.   ASSESSMENT:  A 50 year old morbidly obese Caucasian gentleman with a history  of diabetes, hypertension, hyperlipidemia x14 years who has developed acute  renal failure after an open appendectomy.  1.  Acute renal failure.  With Mr. Parchman recent stressors of abdominal      surgery coupled with receiving IV contrast, this is possibly due to      contrast-induced acute renal failure with acute tubular necrosis.      Additionally, he experienced some transient relative hypotension during      surgery and possibly is thirst basing some fluids with some decreased      intervascular volume depletion further exacerbating his renal function.      Admission creatinine of 1.6 should also represent some baseline chronic      renal insufficiency from diabetic nephropathy or hypertensive      nephrosclerosis.  Would check a UA, urine microscopy, fractional      secretion of sodium and renal ultrasound.  Would place a Foley catheter      while monitoring strict ins and outs and a renal ultrasound to evaluate      kidney size.  Also, because of worsening worry over decreasing urine      output, would need to monitor pulmonary status to avoid pulmonary      __________.  2.  Anemia.  Decrease is likely secondary to surgery and aggressive volume      repletion with some hemodilution.  Will continue to monitor.  3.  Diabetes mellitus.  Would continue sliding scale insulin and monitor      CBGs.  4.  Hypotension.  Would hold BP nebulizers for systolic blood pressures less      than 100.      AK/MEDQ  D:  12/01/2004  T:  12/03/2004  Job:  [id number]

## 2011-02-23 NOTE — Consult Note (Signed)
[facility]                          ENDOCRINOLOGY CONSULTATION   NAME:Steven [name]                     MRN:          [id number]  DATE:01/02/2007                            DOB:          22-Feb-1961    REASON FOR VISIT:  Follow up diabetes.   HISTORY OF PRESENT ILLNESS:  This is a 50 year old man who states that  he had lost some weight but then is starting to regain it.  He currently  takes Lantus 70 units a day and states that his glucoses are lowest in  the morning, but then they increase as the day goes on.   PAST MEDICAL HISTORY:  1. Hematuria for which a workup was negative.  2. GERD.  3. Dyslipidemia.  4. Hypertension.   REVIEW OF SYSTEMS:  He gets hypoglycemia whenever he skips a meal.   PHYSICAL EXAMINATION:  VITAL SIGNS:  Blood pressure 154/90, heart rate  71, temperature 99.2.  The weight is 277.  GENERAL:  No distress.  He does not appear anxious nor depressed.  D   LABORATORY STUDIES:  He reports that his hemoglobin A1c was recently  8.5.   IMPRESSION:  The pattern of home glucoses that he reports indicates that  he needs meal time insulin and less Lantus.   PLAN:  1. Decrease Lantus to 30 units a day.  2. Restart NovoLog 10 units 3 times a day (before meals or food).  3. Return in about 30 days.  4. Stay off Actos.  5. Discontinue glipizide as this is redundant with your insulin.     Sean A. Loanne Drilling, MD  Electronically Signed    SAE/MedQ  DD: 01/05/2007  DT: 01/05/2007  Job #: [id number]   cc:   Zella Richer. Burnett Harry, M.D.

## 2011-02-23 NOTE — H&P (Signed)
NAMECOSTELLO, WINKOWSKI              ACCOUNT NO.:  [id number]   MEDICAL RECORD NO.:  [id number]          PATIENT TYPE:  INP   LOCATION:                               FACILITY:  [facility]   PHYSICIAN:  Merri Ray. Grandville Silos, M.D.DATE OF BIRTH:  [birth year]   DATE OF ADMISSION:  11/30/2004  DATE OF DISCHARGE:                                HISTORY & PHYSICAL   CHIEF COMPLAINT:  Right lower quadrant abdominal pain.   HISTORY OF PRESENT ILLNESS:  Patient is a 50 year old white male who  developed some periumbilical pain about two days ago that gradually migrated  toward his right lower quadrant.  He had some associated nausea and  anorexia.  He did not vomit, but has had no appetite for the past 24-48  hours.  The pain persisted and he saw his primary care doctor.  His doctor  sent him for CT scan which is consistent with acute appendicitis.  Patient  was referred to the [facility] ER from [facility] x-ray.  He has no other complaints,  actually feels slightly better.   PAST MEDICAL HISTORY:  1.  Diabetes mellitus.  2.  Hypertension.  3.  Hypercholesterolemia.   PAST SURGICAL HISTORY:  Negative.   SOCIAL HISTORY:  He lives in [place].  Is married.  He rarely drinks  alcohol and does not smoke.   FAMILY HISTORY:  His mother has a history of pulmonary embolus and DVT.  His  father has a history of coronary artery disease with CABG and has amputation  secondary to his diabetes.   CURRENT MEDICATIONS:  1.  Lantus 38 units subcutaneous q.a.m.  2.  Metformin 500 mg p.o. b.i.d.  3.  Zetia 10 mg p.o. daily.  4.  Zocor daily.  5.  Glucotrol 10 mg p.o. b.i.d.  6.  Actos 15 mg p.o. daily.  7.  Multivitamin daily.  8.  Enteric-coated aspirin 325 mg daily.  9.  Atenolol daily.  10. Another blood pressure pill.  He thinks it is Altace daily.   ALLERGIES:  No known drug allergies.   REVIEW OF SYSTEMS:  GENERAL:  Negative.  HEENT:  Negative.  SKIN:  Negative.  CARDIOPULMONARY:  Negative.  HEMATOLOGY:   Negative.  GENITOURINARY:  Negative.  GASTROINTESTINAL:  See the history of present illness.  In  addition, he has had one episode of diarrhea.  ENDOCRINE:  History of  diabetes and hypercholesterolemia.  Remainder of the review of systems is  noncontributory.   PHYSICAL EXAMINATION:  VITAL SIGNS:  Temperature 101, pulse 86, respirations  18, blood pressure 111/68.  GENERAL:  He is resting comfortably, in no acute distress.  HEENT:  Pupils are equal and reactive.  Sclerae is non-icteric.  NECK:  Supple.  HEART:  Regular rate and rhythm.  PMI is palpable in the left chest.  LUNGS:  Clear to auscultation with good respiratory excursion.  ABDOMEN:  Soft.  He has right lower quadrant pain with some guarding,  especially more laterally.  He has negative Rosvig's sign.  Bowel sounds are  hypoactive.  No other masses are palpable.  EXTREMITIES:  No  significant edema.  NEUROLOGIC:  He is alert and oriented x3.  Cranial nerves II-XII are grossly  intact.  There is no focal gross deficit.   DATA REVIEWED:  CT scan of the abdomen and pelvis which is consistent with  acute appendicitis.  Laboratory studies have been drawn and are pending as  is the chest x-ray and EKG.   IMPRESSION AND PLAN:  A 50 year old male with history of diabetes and  hypertension with acute appendicitis.   PLAN:  Check his chest x-ray, EKG, and laboratory studies and take him to  the operating room for appendectomy.  Laparoscopic appendectomy was  discussed in detail with him including risks and benefits with possibilities  of bleeding, infection, conversion to open procedure and the possibility  that he may have a ruptured appendix requiring a longer hospital stay.  Other questions were answered and he agrees with proceeding.       ___________________________________________  Merri Ray. Grandville Silos, M.D.    BET/MEDQ  D:  11/30/2004  T:  11/30/2004  Job:  [id number]

## 2011-04-08 ENCOUNTER — Other Ambulatory Visit: Payer: Self-pay | Admitting: Family Medicine

## 2011-04-08 NOTE — Telephone Encounter (Signed)
Refill request

## 2011-05-23 ENCOUNTER — Telehealth: Payer: Self-pay | Admitting: Family Medicine

## 2011-05-23 MED ORDER — METOPROLOL SUCCINATE ER 200 MG PO TB24: 200.0000 mg | ORAL_TABLET | Freq: Every day | ORAL | Status: DC

## 2011-05-23 MED ORDER — SIMVASTATIN 40 MG PO TABS: 40.0000 mg | ORAL_TABLET | Freq: Every day | ORAL | Status: DC

## 2011-05-23 MED ORDER — FUROSEMIDE 20 MG PO TABS: 20.0000 mg | ORAL_TABLET | Freq: Two times a day (BID) | ORAL | Status: DC

## 2011-05-23 MED ORDER — RAMIPRIL 10 MG PO CAPS: 10.0000 mg | ORAL_CAPSULE | Freq: Every day | ORAL | Status: DC

## 2011-05-23 MED ORDER — BUPROPION HCL ER (XL) 150 MG PO TB24: 150.0000 mg | ORAL_TABLET | ORAL | Status: DC

## 2011-05-23 MED ORDER — AMLODIPINE BESYLATE 10 MG PO TABS: 10.0000 mg | ORAL_TABLET | Freq: Every day | ORAL | Status: DC

## 2011-05-23 MED ORDER — OMEPRAZOLE 20 MG PO CPDR: 20.0000 mg | DELAYED_RELEASE_CAPSULE | Freq: Every day | ORAL | Status: DC

## 2011-05-23 NOTE — Telephone Encounter (Signed)
Need to have refills for 7 meds.  Amlodipine Simvastatin Furosemide Metoprolol Bupropion Ramipril Omeprazole   Please send these to [facility] in [place]

## 2011-06-26 ENCOUNTER — Telehealth: Payer: Self-pay | Admitting: *Deleted

## 2011-06-26 ENCOUNTER — Ambulatory Visit (INDEPENDENT_AMBULATORY_CARE_PROVIDER_SITE_OTHER): Payer: Self-pay | Admitting: Family Medicine

## 2011-06-26 DIAGNOSIS — E119 Type 2 diabetes mellitus without complications: Secondary | ICD-10-CM

## 2011-06-26 MED ORDER — RAMIPRIL 10 MG PO CAPS: 20.0000 mg | ORAL_CAPSULE | Freq: Every day | ORAL | Status: DC

## 2011-06-26 NOTE — Progress Notes (Signed)
  Subjective:    Patient ID: Steven [name], male    DOB: [birth year], 50 y.o.   MRN: [id number]  HPI    Review of Systems     Objective:   Physical Exam        Assessment & Plan:  Opened in error -- this was supposed to be an Orders Only encounter.

## 2011-06-26 NOTE — Assessment & Plan Note (Signed)
Needs creatinine and the meds before refill provided.

## 2011-06-26 NOTE — Telephone Encounter (Signed)
Refilled and sent to pharmacy

## 2011-06-26 NOTE — Telephone Encounter (Signed)
Patient in for office visit today to refill meds . Because  EPIC was down  He decided to  Just ask MD for refill on Ramapril. States he takes 2 tabs a day.

## 2011-07-02 ENCOUNTER — Ambulatory Visit (INDEPENDENT_AMBULATORY_CARE_PROVIDER_SITE_OTHER): Payer: PRIVATE HEALTH INSURANCE | Admitting: Family Medicine

## 2011-07-02 ENCOUNTER — Encounter: Payer: Self-pay | Admitting: Family Medicine

## 2011-07-02 VITALS — BP 164/93 | HR 100 | Temp 99.1°F | Ht 71.0 in | Wt 343.6 lb

## 2011-07-02 DIAGNOSIS — I10 Essential (primary) hypertension: Secondary | ICD-10-CM

## 2011-07-02 DIAGNOSIS — Z760 Encounter for issue of repeat prescription: Secondary | ICD-10-CM

## 2011-07-02 DIAGNOSIS — Z23 Encounter for immunization: Secondary | ICD-10-CM

## 2011-07-02 MED ORDER — RAMIPRIL 10 MG PO CAPS: 20.0000 mg | ORAL_CAPSULE | Freq: Every day | ORAL | Status: DC

## 2011-07-02 MED ORDER — AMLODIPINE BESYLATE 10 MG PO TABS: 10.0000 mg | ORAL_TABLET | Freq: Every day | ORAL | Status: DC

## 2011-07-02 MED ORDER — OMEPRAZOLE 20 MG PO CPDR
20.0000 mg | DELAYED_RELEASE_CAPSULE | Freq: Every day | ORAL | Status: DC
Start: 2011-07-02 — End: 2011-08-24

## 2011-07-02 MED ORDER — ACCU-CHEK MULTICLIX LANCETS MISC: Status: DC

## 2011-07-02 MED ORDER — INSULIN LISPRO 100 UNIT/ML ~~LOC~~ SOLN: 60.0000 [IU] | Freq: Three times a day (TID) | SUBCUTANEOUS | Status: DC

## 2011-07-02 MED ORDER — INSULIN NPH (HUMAN) (ISOPHANE) 100 UNIT/ML ~~LOC~~ SUSP: 60.0000 [IU] | Freq: Every day | SUBCUTANEOUS | Status: DC

## 2011-07-02 MED ORDER — METOPROLOL SUCCINATE ER 200 MG PO TB24: 200.0000 mg | ORAL_TABLET | Freq: Every day | ORAL | Status: DC

## 2011-07-02 MED ORDER — SIMVASTATIN 40 MG PO TABS: 40.0000 mg | ORAL_TABLET | Freq: Every day | ORAL | Status: DC

## 2011-07-02 MED ORDER — FUROSEMIDE 20 MG PO TABS: 40.0000 mg | ORAL_TABLET | Freq: Two times a day (BID) | ORAL | Status: DC

## 2011-07-02 MED ORDER — EZETIMIBE 10 MG PO TABS: 10.0000 mg | ORAL_TABLET | Freq: Every day | ORAL | Status: DC

## 2011-07-02 MED ORDER — BUPROPION HCL ER (XL) 150 MG PO TB24: 150.0000 mg | ORAL_TABLET | ORAL | Status: DC

## 2011-07-02 MED ORDER — ASPIRIN 81 MG PO TBEC: 81.0000 mg | DELAYED_RELEASE_TABLET | Freq: Every day | ORAL | Status: DC

## 2011-07-02 MED ORDER — GLUCOSE BLOOD VI STRP: ORAL_STRIP | Status: DC

## 2011-07-02 MED ORDER — INSULIN PEN NEEDLE 31G X 8 MM MISC: 1.0000 | Freq: Four times a day (QID) | Status: DC

## 2011-07-02 NOTE — Progress Notes (Signed)
  Subjective:    Patient ID: Steven [name], male    DOB: 1961-03-01, 50 y.o.   MRN: [id number]  HPI 1. Here for medication Refill No complaints at this time. Blood pressure elevated today, claims it's white coat syndrome. He has sleep apnea and is not doing too well with the CPAP machine. He is Obese.   Review of Systems  All other systems reviewed and are negative.       Objective:   Physical Exam  Nursing note and vitals reviewed. Constitutional: He appears well-developed and well-nourished. No distress.  Cardiovascular: Normal rate, regular rhythm and normal heart sounds.   No murmur heard. Pulmonary/Chest: Effort normal and breath sounds normal. No respiratory distress. He has no wheezes. He has no rales.  Skin: He is not diaphoretic.      Assessment & Plan:  1. Medications refilled  2. HTN/leg swelling Increased Lasix to 40 mg BID   3. Follow up with PCP in 2 weeks.

## 2011-07-05 ENCOUNTER — Other Ambulatory Visit: Payer: Self-pay | Admitting: Family Medicine

## 2011-07-05 ENCOUNTER — Telehealth: Payer: Self-pay | Admitting: Family Medicine

## 2011-07-05 MED ORDER — INSULIN ASPART 100 UNIT/ML ~~LOC~~ SOLN: SUBCUTANEOUS | Status: DC

## 2011-07-05 MED ORDER — INSULIN NPH (HUMAN) (ISOPHANE) 100 UNIT/ML ~~LOC~~ SUSP: SUBCUTANEOUS | Status: DC

## 2011-07-05 NOTE — Telephone Encounter (Signed)
Forwarding to [employer] per [place].

## 2011-07-05 NOTE — Telephone Encounter (Signed)
Dr. Francetta Found changed Humulin and Humalog to  Novalin and Novalog. Patient notified.

## 2011-07-05 NOTE — Telephone Encounter (Signed)
Steven [name] is calling because he needs a different type of Insulin that is covered by his insurance.  [facility] [place] [facility]ed over a list of the different insulins that the insurance will cover, 3 days ago, but they have not heard anything back yet.  Steven [name] has been out insulin since then.  He would like a call back.

## 2011-07-06 ENCOUNTER — Other Ambulatory Visit: Payer: Self-pay | Admitting: *Deleted

## 2011-07-06 MED ORDER — INSULIN SYRINGES (DISPOSABLE) U-100 1 ML MISC: Status: DC

## 2011-07-06 NOTE — Telephone Encounter (Signed)
Pharmacy  sent notice yesterday that humulin and humalog are not covered by patient 's insurance. Needs to be changed to novolin and novolog , Dr. Charlett Blake did this  but sent in vials instead of pen. . Pharmacy sent notice back that patient has been on pen , consulted with Dr. Walker Kehr and he advised may send in pen form of both meds. Received notice today that novolin does not come in pen and now patient needs RX for syringes. rx sent in this AM

## 2011-07-24 ENCOUNTER — Ambulatory Visit: Payer: PRIVATE HEALTH INSURANCE | Admitting: Family Medicine

## 2011-08-24 ENCOUNTER — Telehealth: Payer: Self-pay | Admitting: Family Medicine

## 2011-08-24 MED ORDER — ESOMEPRAZOLE MAGNESIUM 20 MG PO CPDR: 20.0000 mg | DELAYED_RELEASE_CAPSULE | Freq: Every day | ORAL | Status: DC

## 2011-08-24 NOTE — Telephone Encounter (Signed)
Forwarded to pcp.Koi Yarbro Lynetta  

## 2011-08-24 NOTE — Telephone Encounter (Signed)
When he was last in, he saw Dr. Vallarie Mare and Dr. Vallarie Mare prescribed all of his medication, but the one for Prilosec is not on his insurance formulary so he needs the Nexium instead and he needs it for a 3 months supply.  [facility] [place].

## 2011-08-27 ENCOUNTER — Ambulatory Visit: Payer: PRIVATE HEALTH INSURANCE | Admitting: Family Medicine

## 2011-09-24 ENCOUNTER — Telehealth: Payer: Self-pay | Admitting: *Deleted

## 2011-09-24 NOTE — Telephone Encounter (Signed)
Form completed by Dr. Mingo Amber and faxed to insurance company.

## 2011-09-24 NOTE — Telephone Encounter (Signed)
PA required for nexium. Form placed in MD box

## 2011-09-25 MED ORDER — OMEPRAZOLE 40 MG PO CPDR: 40.0000 mg | DELAYED_RELEASE_CAPSULE | Freq: Every day | ORAL | Status: DC

## 2011-09-25 NOTE — Telephone Encounter (Signed)
Discussed with insurance.  Prior Steven [name] will not be authorized until trial of Prilosec completed.  Will attempt this now.

## 2011-09-25 NOTE — Telephone Encounter (Signed)
Addended by: Traci Sermon on: 09/25/2011 08:55 AM   Modules accepted: Orders

## 2011-10-03 ENCOUNTER — Other Ambulatory Visit: Payer: Self-pay | Admitting: Family Medicine

## 2011-10-04 ENCOUNTER — Other Ambulatory Visit: Payer: Self-pay | Admitting: Family Medicine

## 2011-10-04 NOTE — Telephone Encounter (Signed)
Refill request

## 2011-10-04 NOTE — Telephone Encounter (Signed)
Steven [name] needs a 90 day supply of 4 medications:  Metoprolol, Bupoprion, Amlodipine and Zetia.  He is completely out and would like these sent in today.  [facility] [place] [facility] [address].  The refill requests should be here.

## 2011-10-04 NOTE — Telephone Encounter (Signed)
Spoke with patient and he states that the scripts need to be written for 3 month supplies instead of 1. It costs less for him

## 2011-10-05 NOTE — Telephone Encounter (Signed)
LVM for patient to call back. ?

## 2011-10-05 NOTE — Telephone Encounter (Signed)
Patient called back and I informed him of below

## 2011-10-05 NOTE — Telephone Encounter (Signed)
I have not seen Steven [name] since 05/2010.  He needs labwork before I will refill any more of his medications.  I provided him with a 1 month supply so he can come in and see me.  However I will not supply any further refills until he comes in to see me and have labwork done.

## 2011-11-05 ENCOUNTER — Other Ambulatory Visit: Payer: Self-pay | Admitting: Family Medicine

## 2011-11-06 NOTE — Telephone Encounter (Signed)
Refill request

## 2011-11-22 ENCOUNTER — Ambulatory Visit: Payer: PRIVATE HEALTH INSURANCE | Admitting: Family Medicine

## 2011-12-04 ENCOUNTER — Encounter: Payer: Self-pay | Admitting: Family Medicine

## 2011-12-06 ENCOUNTER — Ambulatory Visit: Payer: PRIVATE HEALTH INSURANCE | Admitting: Family Medicine

## 2011-12-07 ENCOUNTER — Ambulatory Visit (INDEPENDENT_AMBULATORY_CARE_PROVIDER_SITE_OTHER): Payer: PRIVATE HEALTH INSURANCE | Admitting: Family Medicine

## 2011-12-07 ENCOUNTER — Encounter: Payer: Self-pay | Admitting: Family Medicine

## 2011-12-07 VITALS — BP 136/78 | HR 88 | Temp 99.3°F | Ht 71.0 in | Wt 330.0 lb

## 2011-12-07 DIAGNOSIS — I10 Essential (primary) hypertension: Secondary | ICD-10-CM

## 2011-12-07 DIAGNOSIS — E785 Hyperlipidemia, unspecified: Secondary | ICD-10-CM

## 2011-12-07 DIAGNOSIS — E119 Type 2 diabetes mellitus without complications: Secondary | ICD-10-CM

## 2011-12-07 LAB — COMPREHENSIVE METABOLIC PANEL
ALT: 39 U/L (ref 0–53)
Alkaline Phosphatase: 126 U/L — ABNORMAL HIGH (ref 39–117)
CO2: 27 mEq/L (ref 19–32)
Sodium: 137 mEq/L (ref 135–145)
Total Bilirubin: 0.4 mg/dL (ref 0.3–1.2)
Total Protein: 7 g/dL (ref 6.0–8.3)

## 2011-12-07 LAB — CBC WITH DIFFERENTIAL/PLATELET
Basophils Absolute: 0 10*3/uL (ref 0.0–0.1)
Eosinophils Absolute: 0.1 10*3/uL (ref 0.0–0.7)
Eosinophils Relative: 1 % (ref 0–5)
MCH: 30.3 pg (ref 26.0–34.0)
MCHC: 32.5 g/dL (ref 30.0–36.0)
MCV: 93.3 fL (ref 78.0–100.0)
Platelets: 245 10*3/uL (ref 150–400)
RDW: 13.3 % (ref 11.5–15.5)

## 2011-12-07 LAB — POCT GLYCOSYLATED HEMOGLOBIN (HGB A1C): Hemoglobin A1C: 7.9

## 2011-12-07 LAB — LIPID PANEL: Total CHOL/HDL Ratio: 6.8 Ratio

## 2011-12-07 MED ORDER — INSULIN NPH (HUMAN) (ISOPHANE) 100 UNIT/ML ~~LOC~~ SUSP: SUBCUTANEOUS | Status: DC

## 2011-12-07 NOTE — Patient Instructions (Signed)
I'll send in the refills today. Things to remember:  Goal is 1/2 of plate to be vegetables.  Colorful foods are better: greens, sweet potatoes, that kind of thing.  White is bad. The two supplements are CoQ-10 (for heart protection and energy) and alpha lipoic acid (for regulating blood sugar).   Keep going with the long-acting insulin.   I'll let you know what test results show.

## 2011-12-07 NOTE — Assessment & Plan Note (Signed)
At goal today. Complete metabolic profile and CBC obtained today.

## 2011-12-07 NOTE — Assessment & Plan Note (Signed)
25 minute discussion with [name] as weighed today. He had lots of questions. We did discuss things such as having half of his plate to be full of vegetables, cutting out all "white foods" such as white bread, rice, white mashed potatoes. Also discussed with him CoQ10 as he is on astatin to help with his energy levels as well as of lipoic acid as detailed above. 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

## 2011-12-07 NOTE — Assessment & Plan Note (Addendum)
Patient has not had any recent episodes of hypoglycemia since the decided to change his insulin to only using more that long acting insulin. We're therefore going to increase to 100 units at nighttime with as needed daily insulin during meals. I also recommended him to look up information regarding alpha lipoic acid and its effects on blood sugar. I recommended him to try this at 200 mg twice a day and see if this helps with his regulation of blood sugars at all. I be interested to note this actually was able to help him. He knows that he needs followup with ophthalmology and states he'll schedule this on his own

## 2011-12-07 NOTE — Assessment & Plan Note (Signed)
Lipid panel today

## 2011-12-07 NOTE — Progress Notes (Signed)
  Subjective:    Patient ID: Steven [name], male    DOB: [birth year], 51 y.o.   MRN: [id number]  HPI Steven [name] returns to clinic today after a long absence. He states that he has been extremely stressful year this past year. He has gone through a divorce and has had to switch jobs. He states that despite this he feels he is still doing well. He has a goal of working hard on his obesity in the next year and will like to lose 60 pounds by December 2013. He is specifically taken a job where he will be much more active during the day in order to help lose weight. He has several questions about supplements and obesity today.   Diabetes:  Currently on daily Novolog short-acting as well as Humulin long-acting.  States he works out in [employer] most of day and has trouble with regulating his insulin levels.  Uses the Novolin long-acting much more to cover him during the day the next day.  Is using about 100 units in AM.    No adverse effects from medication.  No hypoglycemic events.  No paresthesia or peripheral nerve pain.  Measures blood sugars at home every: 3-4 times a day.  Runs roughly 90 - 140 in AM, variable in PM, can run from 100 - 300s depending on how much he has remembered to take his insulin during the day.   Lab Results  Component Value Date   HGBA1C 7.9 12/07/2011   Hypertension:  Long-term problem for this patient.  No adverse effects from medication.  Not checking it regularly.  No HA, CP, dizziness, shortness of breath, palpitations, or LE swelling.   BP Readings from Last 3 Encounters:  07/02/11 164/93  05/24/10 167/81  11/22/09 150/84   HLD:  Last lipid panel listed below.    Currently is/not on Statin.  Denies any myalgias, icterus, jaundice.  Tolerating medications well.   Lab Results  Component Value Date   CHOL 170 06/28/2010   CHOL 187 10/25/2009   CHOL 155 04/19/2008   Lab Results  Component Value Date   HDL 36* 06/28/2010   HDL 37* 10/25/2009   HDL 30* 04/19/2008   Lab  Results  Component Value Date   LDLCALC 57 06/28/2010   LDLCALC 74 10/25/2009   LDLCALC 48 04/19/2008   Lab Results  Component Value Date   TRIG 384* 06/28/2010   TRIG 380* 10/25/2009   TRIG 385* 04/19/2008   Lab Results  Component Value Date   CHOLHDL 4.7 Ratio 06/28/2010   CHOLHDL 5.1 Ratio 10/25/2009   CHOLHDL 5.2 Ratio 04/19/2008      Review of Systems See HPI above for review of systems.       Objective:   Physical Exam Gen:  Alert, cooperative patient who appears stated age in no acute distress.  Vital signs reviewed. HEENT:  [place]/AT.  EOMI, PERRL.  MMM, tonsils non-erythematous, non-edematous.  External ears WNL, Bilateral TM's normal without retraction, redness or bulging.  Pulm:  Clear to auscultation bilaterally with good air movement.  No wheezes or rales noted.   Abd:  Soft/nondistended/nontender.  Good bowel sounds throughout all four quadrants.  No masses noted.  Ext:  No clubbing/cyanosis/erythema.  +2 edema BL LE's          Assessment & Plan:

## 2011-12-11 ENCOUNTER — Encounter: Payer: Self-pay | Admitting: Family Medicine

## 2011-12-11 ENCOUNTER — Other Ambulatory Visit: Payer: Self-pay | Admitting: Family Medicine

## 2011-12-12 ENCOUNTER — Telehealth: Payer: Self-pay | Admitting: Family Medicine

## 2011-12-12 MED ORDER — FUROSEMIDE 20 MG PO TABS: 40.0000 mg | ORAL_TABLET | Freq: Two times a day (BID) | ORAL | Status: DC

## 2011-12-12 MED ORDER — EZETIMIBE 10 MG PO TABS: 10.0000 mg | ORAL_TABLET | Freq: Every day | ORAL | Status: DC

## 2011-12-12 MED ORDER — INSULIN NPH (HUMAN) (ISOPHANE) 100 UNIT/ML ~~LOC~~ SUSP: SUBCUTANEOUS | Status: DC

## 2011-12-12 MED ORDER — METOPROLOL SUCCINATE ER 200 MG PO TB24: 200.0000 mg | ORAL_TABLET | Freq: Every day | ORAL | Status: DC

## 2011-12-12 MED ORDER — INSULIN ASPART 100 UNIT/ML ~~LOC~~ SOLN: SUBCUTANEOUS | Status: DC

## 2011-12-12 MED ORDER — SIMVASTATIN 40 MG PO TABS: 40.0000 mg | ORAL_TABLET | Freq: Every day | ORAL | Status: DC

## 2011-12-12 MED ORDER — AMLODIPINE BESYLATE 10 MG PO TABS: 10.0000 mg | ORAL_TABLET | Freq: Every day | ORAL | Status: DC

## 2011-12-12 MED ORDER — BUPROPION HCL ER (XL) 150 MG PO TB24: 150.0000 mg | ORAL_TABLET | ORAL | Status: DC

## 2011-12-12 MED ORDER — ESOMEPRAZOLE MAGNESIUM 20 MG PO CPDR: 20.0000 mg | DELAYED_RELEASE_CAPSULE | Freq: Every day | ORAL | Status: DC

## 2011-12-12 MED ORDER — GLUCOSE BLOOD VI STRP: ORAL_STRIP | Status: DC

## 2011-12-12 MED ORDER — RAMIPRIL 10 MG PO CAPS: 20.0000 mg | ORAL_CAPSULE | Freq: Every day | ORAL | Status: DC

## 2011-12-12 NOTE — Telephone Encounter (Signed)
Refill request

## 2011-12-12 NOTE — Telephone Encounter (Signed)
Never got any of the meds that were supposed to have been called in on Friday Needs 90 day supply.  [facility] [place]

## 2011-12-13 ENCOUNTER — Telehealth: Payer: Self-pay | Admitting: Family Medicine

## 2011-12-13 NOTE — Telephone Encounter (Signed)
[name] calling about labs.  Want to know if there were anything else he needed to be concerned about to call him and advise what to do.  If not don't have to call.

## 2011-12-24 NOTE — Telephone Encounter (Signed)
Discussed results with Mr.Sargent.  Discussed likely component of fatty liver contributing to elevated LFTs and triglycerides.  Diet also poor. He confides that he has been drinking more than usual after his divorce, but he ended this about a month prior to seeing me.  Has changed his diet as we discussed.  FU in 3 months for repeat labs.

## 2012-02-26 ENCOUNTER — Telehealth: Payer: Self-pay | Admitting: Family Medicine

## 2012-02-26 NOTE — Telephone Encounter (Signed)
Patient is calling about the Rx for his Insulin.  Apparently, it was written for 1 box and he needs 2 boxes of Novolin and 3 boxes of Novolog.  Also, if there is a generic/less expensive for Zetia.

## 2012-02-27 MED ORDER — INSULIN NPH (HUMAN) (ISOPHANE) 100 UNIT/ML ~~LOC~~ SUSP: SUBCUTANEOUS | Status: DC

## 2012-02-27 NOTE — Telephone Encounter (Signed)
Pt notes that his novolog is too expensive.  Plan to change to novolin N daily.  Plan to f/u with Dr. Mingo Amber ASAP.  Recomneded considering twice daily NPH insulin if glucoses are not controlled, with frequent BS checks.

## 2012-02-27 NOTE — Telephone Encounter (Signed)
Patient states the Novolog is too expensive . One vial costs him $180.00.   He is out of Novlog now.He had previously been on Novolin N 100 units daily. Now he has worked himself up to 225 units of Novolin N and this seems to keep his BS in better control than taking Novolog before each  Meal.  States he does not have low blood sugar spells. States he weighs more than 300 pounds and insulin doesn't seem to have that affect on him. States now his before meal BS runs 80-100. Will forward message to Dr. Georgina Snell to please advise.  Patient wants RX sent for Novolin N and to increase dose on directions .

## 2012-02-27 NOTE — Telephone Encounter (Signed)
Is calling today and found out that he can get Novalin N a lot cheaper than the Novalog - wants to switch to completely 225 units Novalin N and lessen the Novalog as needed.  Wants to know what he has to do to get this done.

## 2012-02-27 NOTE — Telephone Encounter (Signed)
Pt also mentioned that he will be out today

## 2012-04-16 ENCOUNTER — Telehealth: Payer: Self-pay | Admitting: Family Medicine

## 2012-04-16 NOTE — Telephone Encounter (Signed)
Needs enough novalin H and novalog substitute since that it too expensive - explained how this is been hard to keep control of -  Needs 24 hr or fast acting [facility]- Steven [name] Cannot come in for appt this month due to his job.  Pt has an appt on 8/5

## 2012-04-16 NOTE — Telephone Encounter (Signed)
Spoke with patient to clarify what he wants. He has been taking between 200 and 220 units of  Novolin  N . Takes 175 in AM and the rest in the evening.  Has not been able to get Novolog because it costs  $189 per box. He can get Novolin N for $23 at [facility] in [place].  He wants rx to last a month . Has appointment 08/05 with Dr. Mingo Amber.  When Dr. Georgina Snell last spoke with patient he suggested that Dr. Mingo Amber might be able to give him a faster acting insulin to have when BS are high. I ask if he   is referring to Regular insulin and he thinks this is what it is.  Will forward to Dr. Mingo Amber

## 2012-04-17 ENCOUNTER — Telehealth: Payer: Self-pay | Admitting: *Deleted

## 2012-04-17 MED ORDER — INSULIN NPH (HUMAN) (ISOPHANE) 100 UNIT/ML ~~LOC~~ SUSP: SUBCUTANEOUS | Status: DC

## 2012-04-17 MED ORDER — INSULIN ASPART 100 UNIT/ML ~~LOC~~ SOLN: SUBCUTANEOUS | Status: DC

## 2012-04-17 NOTE — Telephone Encounter (Signed)
Will refill enough for him to come to appt.  Do not want to switch to shorter acting insulin without being able to explain usage of this to him in person.

## 2012-04-17 NOTE — Telephone Encounter (Signed)
Called pt to inform him that his meds have been sent to his pharmacy and that Dr. Mingo Amber refilled them the way that he has been getting them and asked that I tell the pt that he would rather discuss this with him at his appt to better explain the way that it works. Pt voiced understanding and agreed.Audelia Hives [place]

## 2012-05-12 ENCOUNTER — Encounter: Payer: Self-pay | Admitting: Family Medicine

## 2012-05-12 ENCOUNTER — Ambulatory Visit (INDEPENDENT_AMBULATORY_CARE_PROVIDER_SITE_OTHER): Payer: Self-pay | Admitting: Family Medicine

## 2012-05-12 VITALS — BP 148/86 | HR 87 | Ht 71.0 in | Wt 335.0 lb

## 2012-05-12 DIAGNOSIS — E785 Hyperlipidemia, unspecified: Secondary | ICD-10-CM

## 2012-05-12 DIAGNOSIS — E119 Type 2 diabetes mellitus without complications: Secondary | ICD-10-CM

## 2012-05-12 LAB — POCT GLYCOSYLATED HEMOGLOBIN (HGB A1C): Hemoglobin A1C: 8.2

## 2012-05-12 MED ORDER — PRAVASTATIN SODIUM 40 MG PO TABS: 40.0000 mg | ORAL_TABLET | Freq: Every day | ORAL | Status: DC

## 2012-05-12 MED ORDER — PRAVASTATIN SODIUM 20 MG PO TABS: 20.0000 mg | ORAL_TABLET | Freq: Every day | ORAL | Status: DC

## 2012-05-12 NOTE — Progress Notes (Signed)
Subjective:    Patient ID: Steven [name], male    DOB: 07/04/1961, 51 y.o.   MRN: [id number]  HPI  Diabetes:  Currently on Novolin N and regular Novolin.   No adverse effects from medication.  No hypoglycemic events.  No paresthesia or peripheral nerve pain.  Measures blood sugars at home every:  Day.  States that he does go several hours without eating, sometimes up to 13 hours straight, due to his job.   Lab Results  Component Value Date   HGBA1C 8.2 05/12/2012   HLD:  Last lipid panel listed below.    Currently is on Statin.  Denies any icterus, jaundice.  Has been having myalgias in legs and lower back.  Lower back pain present for years since fall.  Myalgias just past year, stopped when he took himself off statin, but he restarted and pain returned.  Describes as "mild" pain.   Lab Results  Component Value Date   CHOL 307* 12/07/2011   CHOL 170 06/28/2010   CHOL 187 10/25/2009   Lab Results  Component Value Date   HDL 45 12/07/2011   HDL 36* 06/28/2010   HDL 37* 10/25/2009   Lab Results  Component Value Date   LDLCALC Comment:   Not calculated due to Triglyceride >400. Suggest ordering Direct LDL (Unit Code: [phone]).   Total Cholesterol/HDL Ratio:CHD Risk                        Coronary Heart Disease Risk Table                                        Men       Women          1/2 Average Risk              3.4        3.3              Average Risk              5.0        4.4           2X Average Risk              9.6        7.1           3X Average Risk             23.4       11.0 Use the calculated Patient Ratio above and the CHD Risk table  to determine the patient's CHD Risk. ATP III Classification (LDL):       < 100        mg/dL         Optimal      100 - 129     mg/dL         Near or Above Optimal      130 - 159     mg/dL         Borderline High      160 - 189     mg/dL         High       > 190        mg/dL         Very High   12/07/2011   LDLCALC 57 06/28/2010  LDLCALC 74 10/25/2009   Lab  Results  Component Value Date   TRIG 787* 12/07/2011   TRIG 384* 06/28/2010   TRIG 380* 10/25/2009   Lab Results  Component Value Date   CHOLHDL 6.8 12/07/2011   CHOLHDL 4.7 Ratio 06/28/2010   CHOLHDL 5.1 Ratio 10/25/2009     Review of Systems See HPI above for review of systems.       Objective:   Physical Exam  Gen:  Alert, cooperative patient who appears stated age in no acute distress.  Vital signs reviewed. HEENT:  [place]/AT.  EOMI, PERRL.  MMM, tonsils non-erythematous, non-edematous.  Cardiac:  Regular rate and rhythm without murmur auscultated.  Good S1/S2. Pulm:  Clear to auscultation bilaterally with good air movement.  No wheezes or rales noted.   Abdomen: Obese     Assessment & Plan:

## 2012-05-12 NOTE — Progress Notes (Signed)
Medication Samples have been provided to the patient.  Drug name: Jonelle Sports: 1  [id number] Exp.Date: 10/2013  Medication Samples have been provided to the patient.  Drug name: LANTUS  Qty: 2  LOT: [id number]  Exp.Date: 05/2014  The patient has been instructed regarding the correct time, dose, and frequency of taking this medication, including desired effects and most common side effects.   Bueford Arp, Kevin Fenton 5:29 PM 05/12/2012    The patient has been instructed regarding the correct time, dose, and frequency of taking this medication, including desired effects and most common side effects.   Thomasene Lot 5:26 PM 05/12/2012

## 2012-05-12 NOTE — Patient Instructions (Signed)
I'm going to try the Pravastatin at 20 mg.  This is a lower medication.   The name of the supplement is CoQ-10.  This can help with the muscle aches.   Come back and see me and we can do lab work.

## 2012-05-13 ENCOUNTER — Encounter: Payer: Self-pay | Admitting: Family Medicine

## 2012-05-13 NOTE — Assessment & Plan Note (Signed)
Patient has started new job and will be with new insurance in 3 weeks.   Would like to be back on Lantus as that point -- feels CBGs were best controlled when he was taking this (years ago).   Provided him with Lantus pens here in clinic for next few days to get a feel for what he will need to be on when we switch him.  Otherwise he will be calling me with the cheapest insulin that [facility] offers (he states there is one that is $23, I assume this is the 70/30 he is already on) to cover for him until he obtains his insurance and can get Lantus.  Will refer for ophthalmic retinopathy once insurance kicks in.

## 2012-05-13 NOTE — Assessment & Plan Note (Addendum)
Lipid panel in 1 month, will have insurance at that time. Changed to Pravastatin 40 as this is on $4 plan at [facility].   Added CoQ-10 to help with myalgias.   May need decrease in dosage or switch to Lipitor at lower dose to help with this.   Provided red flags.

## 2012-05-21 ENCOUNTER — Other Ambulatory Visit: Payer: Self-pay | Admitting: Family Medicine

## 2012-05-21 NOTE — Telephone Encounter (Signed)
Pt was told to call back to see if he can get insulin before he gets his insurance kicks in.  He is also out of BP meds.  He is asking if Dr Mingo Amber can help him get samples for at least 2 weeks

## 2012-05-22 MED ORDER — LISINOPRIL-HYDROCHLOROTHIAZIDE 20-25 MG PO TABS: 1.0000 | ORAL_TABLET | Freq: Every day | ORAL | Status: DC

## 2012-05-22 NOTE — Telephone Encounter (Signed)
Switching from Ramipril and Amlodipine to HCTZ-Lisinopril combination pill that is on $4 plan at [facility].  Also removing simvastatin from med list, he is on Pravachol as this is available on $4 list at [employer] [facility].

## 2012-05-22 NOTE — Telephone Encounter (Signed)
Pt is calling again - is now out of his BP meds and cannot afford

## 2012-05-22 NOTE — Telephone Encounter (Signed)
Taking amlodipine and ramipril and these are not on the $4 list. Spoke with Dr. Mingo Amber and he will change to something else and sent to [facility] [address]. Pt will p/u insulin Monday.Audelia Hives [place]

## 2012-05-22 NOTE — Telephone Encounter (Signed)
[facility]ed [facility] [address] and gave information to have Rx filled for HCTZ-lisinopril for pt to pick up this one time. After that he [facility] [place].Audelia Hives [place]

## 2012-05-22 NOTE — Telephone Encounter (Signed)
Had to leave message.  We can provide him with Lantus samples.  Evidently his insurance will start in 2 weeks and we can get him Lantus through insurance at that time.  Otherwise his medications should be on $4 plan, if not I can switch to medications that are.    Kenney Houseman or Clarise Cruz, will you help Steven [name] set up picking up Lantus samples?  Thanks,  Merry Proud

## 2012-06-17 ENCOUNTER — Telehealth: Payer: Self-pay | Admitting: Family Medicine

## 2012-06-17 NOTE — Addendum Note (Signed)
Addended by: Teddy Spike on: 06/17/2012 01:03 PM   Modules accepted: Orders

## 2012-06-17 NOTE — Telephone Encounter (Signed)
I will forward this message to Dr. Mingo Amber.Audelia Hives [place]

## 2012-06-17 NOTE — Telephone Encounter (Signed)
Pt just got his insurance and needs his DM meds called in [place] it switched back to his regular meds.  (he had changed some so he can afford it)

## 2012-06-17 NOTE — Telephone Encounter (Signed)
Spoke with pt and these are the meds that he is asking to be sent to [facility]. Thanks.

## 2012-06-18 ENCOUNTER — Telehealth: Payer: Self-pay | Admitting: Family Medicine

## 2012-06-18 MED ORDER — EZETIMIBE 10 MG PO TABS: 10.0000 mg | ORAL_TABLET | Freq: Every day | ORAL | Status: DC

## 2012-06-18 MED ORDER — INSULIN GLARGINE 100 UNIT/ML ~~LOC~~ SOLN: SUBCUTANEOUS | Status: DC

## 2012-06-18 MED ORDER — BUPROPION HCL ER (XL) 150 MG PO TB24: 150.0000 mg | ORAL_TABLET | ORAL | Status: DC

## 2012-06-18 MED ORDER — GLUCOSE BLOOD VI STRP: ORAL_STRIP | Status: DC

## 2012-06-18 MED ORDER — INSULIN ASPART 100 UNIT/ML ~~LOC~~ SOLN: SUBCUTANEOUS | Status: DC

## 2012-06-18 MED ORDER — AMLODIPINE BESYLATE 10 MG PO TABS: 10.0000 mg | ORAL_TABLET | Freq: Every day | ORAL | Status: DC

## 2012-06-18 MED ORDER — RAMIPRIL 10 MG PO CAPS: 20.0000 mg | ORAL_CAPSULE | Freq: Every day | ORAL | Status: DC

## 2012-06-18 MED ORDER — ESOMEPRAZOLE MAGNESIUM 20 MG PO CPDR: 20.0000 mg | DELAYED_RELEASE_CAPSULE | Freq: Every day | ORAL | Status: DC

## 2012-06-18 NOTE — Telephone Encounter (Signed)
Called and talked with patient as we had discussed switch to Lantus at prior visits but refill showed Novolin.  Patient would much prefer Lantus and insurance will now pay for it.  He has been receiving this as samples from here.  Will send in this prescription now.

## 2012-06-18 NOTE — Telephone Encounter (Signed)
Please see other phone note by me.

## 2012-06-26 ENCOUNTER — Other Ambulatory Visit: Payer: Self-pay | Admitting: Family Medicine

## 2012-06-26 NOTE — Telephone Encounter (Signed)
Hasn't been taking the metoprolol and needs some called into [facility] Didn't realize that he hasn't been taking it but he realized that his bp was acting crazy and didn't think about it until today - pls advise

## 2012-06-26 NOTE — Telephone Encounter (Signed)
Spoke with patient he states that he has not taken metoprolol in over a month now. He has forgotten to get this medication filled along with the others, he is needing a refill on this.

## 2012-06-27 MED ORDER — METOPROLOL SUCCINATE ER 200 MG PO TB24: 200.0000 mg | ORAL_TABLET | Freq: Every day | ORAL | Status: DC

## 2012-07-07 ENCOUNTER — Other Ambulatory Visit: Payer: Self-pay | Admitting: Family Medicine

## 2012-07-08 ENCOUNTER — Other Ambulatory Visit: Payer: Self-pay | Admitting: Family Medicine

## 2012-07-08 MED ORDER — OMEPRAZOLE 40 MG PO CPDR: 40.0000 mg | DELAYED_RELEASE_CAPSULE | Freq: Every day | ORAL | Status: DC

## 2012-07-21 ENCOUNTER — Other Ambulatory Visit: Payer: Self-pay | Admitting: Family Medicine

## 2012-07-22 ENCOUNTER — Other Ambulatory Visit: Payer: Self-pay | Admitting: Family Medicine

## 2012-08-28 ENCOUNTER — Other Ambulatory Visit: Payer: Self-pay | Admitting: Family Medicine

## 2012-09-15 ENCOUNTER — Other Ambulatory Visit: Payer: Self-pay | Admitting: Family Medicine

## 2012-09-18 ENCOUNTER — Other Ambulatory Visit: Payer: Self-pay | Admitting: Family Medicine

## 2013-01-13 ENCOUNTER — Other Ambulatory Visit: Payer: Self-pay | Admitting: Family Medicine

## 2013-02-23 ENCOUNTER — Telehealth: Payer: Self-pay | Admitting: Family Medicine

## 2013-02-23 MED ORDER — INSULIN NPH (HUMAN) (ISOPHANE) 100 UNIT/ML ~~LOC~~ SUSP: SUBCUTANEOUS | Status: DC

## 2013-02-23 MED ORDER — INSULIN LISPRO 100 UNIT/ML ~~LOC~~ SOLN: 30.0000 [IU] | Freq: Three times a day (TID) | SUBCUTANEOUS | Status: DC

## 2013-02-23 NOTE — Telephone Encounter (Signed)
Done

## 2013-02-23 NOTE — Telephone Encounter (Signed)
Will route request to Dr. Mingo Amber.  Nolene Ebbs, RN

## 2013-02-23 NOTE — Telephone Encounter (Signed)
Pt has changed insurances and they won't pay for the insulin that he is currently on they are asking for it to be changed to Lily Humulog and Humulin products (he takes slow and fast acting insulin) [facility] [place]  He has appt on 6/2 to see Dr Mingo Amber

## 2013-03-06 ENCOUNTER — Other Ambulatory Visit: Payer: Self-pay | Admitting: Family Medicine

## 2013-03-09 ENCOUNTER — Ambulatory Visit: Payer: Self-pay | Admitting: Family Medicine

## 2013-03-10 ENCOUNTER — Other Ambulatory Visit: Payer: Self-pay | Admitting: *Deleted

## 2013-03-10 MED ORDER — AMLODIPINE BESYLATE 10 MG PO TABS: 10.0000 mg | ORAL_TABLET | Freq: Every day | ORAL | Status: DC

## 2013-03-10 NOTE — Telephone Encounter (Signed)
Requested Prescriptions   Pending Prescriptions Disp Refills  . amLODipine (NORVASC) 10 MG tablet 90 tablet 0

## 2013-05-21 ENCOUNTER — Telehealth: Payer: Self-pay | Admitting: *Deleted

## 2013-05-21 NOTE — Telephone Encounter (Signed)
Letter sent regarding diabetes follow up care [name], RN-BSN   

## 2013-06-19 ENCOUNTER — Other Ambulatory Visit: Payer: Self-pay | Admitting: Family Medicine

## 2013-08-28 ENCOUNTER — Other Ambulatory Visit: Payer: Self-pay | Admitting: Family Medicine

## 2013-10-02 ENCOUNTER — Other Ambulatory Visit: Payer: Self-pay | Admitting: Family Medicine

## 2013-11-30 ENCOUNTER — Other Ambulatory Visit: Payer: Self-pay | Admitting: *Deleted

## 2013-11-30 ENCOUNTER — Other Ambulatory Visit: Payer: Self-pay | Admitting: Family Medicine

## 2013-11-30 MED ORDER — FUROSEMIDE 20 MG PO TABS: ORAL_TABLET | ORAL | Status: DC

## 2013-12-08 ENCOUNTER — Other Ambulatory Visit: Payer: Self-pay | Admitting: Family Medicine

## 2014-01-07 ENCOUNTER — Other Ambulatory Visit: Payer: Self-pay | Admitting: Family Medicine

## 2014-01-18 ENCOUNTER — Other Ambulatory Visit: Payer: Self-pay | Admitting: Family Medicine

## 2014-01-22 ENCOUNTER — Other Ambulatory Visit: Payer: Self-pay | Admitting: Family Medicine

## 2014-03-08 ENCOUNTER — Encounter: Payer: Self-pay | Admitting: Home Health Services

## 2014-03-13 ENCOUNTER — Other Ambulatory Visit: Payer: Self-pay | Admitting: Family Medicine

## 2014-05-29 ENCOUNTER — Other Ambulatory Visit: Payer: Self-pay | Admitting: Family Medicine

## 2014-05-31 NOTE — Telephone Encounter (Signed)
Left voice message for pt to call and schedule an appointment with PCP regarding medications.  Please call and let nurse if out of medications.  Derl Barrow, RN

## 2014-05-31 NOTE — Telephone Encounter (Signed)
Patient was last seen Aug 2013, need follow up with PCP for refill. If completely out of medication, I can give few days supply till able to see PCP. Let me know.

## 2014-08-18 ENCOUNTER — Telehealth: Payer: Self-pay | Admitting: *Deleted

## 2014-08-18 DIAGNOSIS — E119 Type 2 diabetes mellitus without complications: Secondary | ICD-10-CM

## 2014-08-18 NOTE — Telephone Encounter (Signed)
Pt calls and states that he would like to schedule an appointment in December (since he has insurance now) but he wants Dr. Mingo Amber to call him in medications to last until his appt.  Advised that Dr. Mingo Amber has appt on Nov 17th but he does not want to come in for that appt because he wants to have "medications in my body before I come in for an appt".  I attempted to explain that since he has been off the medications so long (>1year) that an appt would really need to be made before same Rx can be described.  Pt was upset by this and states "well just ask him but let him know that I will go to a walkin clinic to get my meds, I WANT to be ont he medications before I come back into the office for an appt".  Advised I would send a message to MD. Clinton Sawyer, Salome Spotted

## 2014-08-20 MED ORDER — ACCU-CHEK MULTICLIX LANCETS MISC: Status: DC

## 2014-08-20 MED ORDER — BUPROPION HCL ER (XL) 150 MG PO TB24: ORAL_TABLET | ORAL | Status: DC

## 2014-08-20 MED ORDER — METOPROLOL SUCCINATE ER 200 MG PO TB24: ORAL_TABLET | ORAL | Status: DC

## 2014-08-20 MED ORDER — INSULIN LISPRO 100 UNIT/ML ~~LOC~~ SOLN: 30.0000 [IU] | Freq: Three times a day (TID) | SUBCUTANEOUS | Status: DC

## 2014-08-20 MED ORDER — LISINOPRIL-HYDROCHLOROTHIAZIDE 20-25 MG PO TABS: ORAL_TABLET | ORAL | Status: DC

## 2014-08-20 MED ORDER — INSULIN SYRINGES (DISPOSABLE) U-100 1 ML MISC: Status: DC

## 2014-08-20 MED ORDER — INSULIN PEN NEEDLE 31G X 8 MM MISC: 1.0000 | Freq: Four times a day (QID) | Status: DC

## 2014-08-20 MED ORDER — ESOMEPRAZOLE MAGNESIUM 20 MG PO CPDR: 20.0000 mg | DELAYED_RELEASE_CAPSULE | Freq: Every day | ORAL | Status: DC

## 2014-08-20 MED ORDER — SIMVASTATIN 40 MG PO TABS: ORAL_TABLET | ORAL | Status: DC

## 2014-08-20 MED ORDER — AMLODIPINE BESYLATE 10 MG PO TABS: ORAL_TABLET | ORAL | Status: DC

## 2014-08-20 MED ORDER — GLUCOSE BLOOD VI STRP: ORAL_STRIP | Status: DC

## 2014-08-20 MED ORDER — OMEPRAZOLE 40 MG PO CPDR: DELAYED_RELEASE_CAPSULE | ORAL | Status: DC

## 2014-08-20 MED ORDER — INSULIN GLARGINE 100 UNIT/ML ~~LOC~~ SOLN: SUBCUTANEOUS | Status: DC

## 2014-08-20 MED ORDER — FUROSEMIDE 20 MG PO TABS: 40.0000 mg | ORAL_TABLET | Freq: Two times a day (BID) | ORAL | Status: DC

## 2014-08-20 NOTE — Telephone Encounter (Signed)
Pt informed. Steven [name], Steven [name]  

## 2014-08-20 NOTE — Telephone Encounter (Signed)
Completed.  Please call to let him know.

## 2014-08-20 NOTE — Telephone Encounter (Signed)
Patient calls again, would like to know status of med refill. Advised patient that Dr. Mingo Amber is in clinic at this time. Patient requesting callback [phone]

## 2014-09-07 ENCOUNTER — Ambulatory Visit: Payer: Self-pay | Admitting: Family Medicine

## 2014-09-20 ENCOUNTER — Other Ambulatory Visit: Payer: Self-pay | Admitting: *Deleted

## 2014-09-20 NOTE — Telephone Encounter (Signed)
Pt is requesting a 30 day supply= 3 vials per pharmacy.  Derl Barrow, RN

## 2014-09-21 MED ORDER — INSULIN LISPRO 100 UNIT/ML ~~LOC~~ SOLN: 30.0000 [IU] | Freq: Three times a day (TID) | SUBCUTANEOUS | Status: DC

## 2014-09-29 ENCOUNTER — Telehealth: Payer: Self-pay | Admitting: *Deleted

## 2014-09-29 NOTE — Telephone Encounter (Signed)
Pharmacy called.  They are requesting for pts rx for humalog to be changed to a quantity of 30, so the price will be cheaper for patient.  Will forward to MD. Clinton Sawyer, Salome Spotted

## 2014-09-30 NOTE — Telephone Encounter (Signed)
The last time I saw Mr. Hudecek was in 2013.  I will not refill his medications until he is seen in clinic again. I have sent notes to the pharmacy with the last several refills that I will not refill his medications until he is seen, but he has yet to make an appt. When I see an appt on the books then I will refill a month's worth of his medications.   I will provide more refills after he's seen in clinic.

## 2014-10-04 NOTE — Telephone Encounter (Signed)
[place]. Carolin Quang, Salome Spotted

## 2014-10-05 ENCOUNTER — Telehealth: Payer: Self-pay | Admitting: *Deleted

## 2014-10-05 MED ORDER — INSULIN LISPRO 100 UNIT/ML ~~LOC~~ SOLN: 30.0000 [IU] | Freq: Three times a day (TID) | SUBCUTANEOUS | Status: DC

## 2014-10-05 NOTE — Telephone Encounter (Signed)
Left message on vm for patient to schedule appt.

## 2014-10-05 NOTE — Telephone Encounter (Signed)
Reviewed previous encounters, patient has not been seen since 2013, Dr. Mingo Amber previously refused to refill medications until patient seen in office  Called pharmacy to cancel Humalog prescription  Note to nursing staff - please call patient to schedule an appointment with Dr. Mingo Amber

## 2014-10-05 NOTE — Telephone Encounter (Signed)
Received a fax from [facility] [phone] stating they need a 30 supply of the Humalog.  The quantity will need to be 30 ml (3 vials) to last 30 days per insurance. Please send new Rx to [facility].  Derl Barrow, RN

## 2014-10-05 NOTE — Telephone Encounter (Signed)
[facility]ials, inject 30 units before each meal as per previous prescriptions  Note to nursing staff - please call patient to schedule follow up of diabetes.

## 2014-10-08 ENCOUNTER — Other Ambulatory Visit: Payer: Self-pay | Admitting: Family Medicine

## 2014-10-12 NOTE — Telephone Encounter (Signed)
Note to nursing staff - received refill request for Toprol, per last refill patient needed and appointment before next refill, he has not yet been seen in office, please schedule follow up for BP, will not refill until seen in office

## 2014-10-18 ENCOUNTER — Other Ambulatory Visit: Payer: Self-pay | Admitting: Family Medicine

## 2014-12-01 ENCOUNTER — Other Ambulatory Visit: Payer: Self-pay | Admitting: Family Medicine

## 2014-12-01 MED ORDER — INSULIN LISPRO 100 UNIT/ML ~~LOC~~ SOLN: 30.0000 [IU] | Freq: Three times a day (TID) | SUBCUTANEOUS | Status: DC

## 2014-12-01 MED ORDER — AMLODIPINE BESYLATE 10 MG PO TABS: ORAL_TABLET | ORAL | Status: DC

## 2014-12-01 MED ORDER — METOPROLOL SUCCINATE ER 200 MG PO TB24: ORAL_TABLET | ORAL | Status: DC

## 2014-12-01 MED ORDER — LISINOPRIL-HYDROCHLOROTHIAZIDE 20-25 MG PO TABS: ORAL_TABLET | ORAL | Status: DC

## 2014-12-01 MED ORDER — INSULIN GLARGINE 100 UNIT/ML ~~LOC~~ SOLN: SUBCUTANEOUS | Status: DC

## 2014-12-01 NOTE — Telephone Encounter (Signed)
Pt called and needs refills on his Amlodipine, Lisinopril, Metoprolol, and insulin. He has an appointment with his new doctor in [place] on 12/29/14 but is out of his medications. He would really like just 1 month refills to get him to his appointment. He is transferring back to his previous doctor only because of travel from [place] to here. jw

## 2014-12-24 ENCOUNTER — Telehealth: Payer: Self-pay | Admitting: *Deleted

## 2014-12-24 NOTE — Telephone Encounter (Signed)
Called pt and he is actually going to another doctor. He did try to make an appt with dr Mingo Amber but he had no openings. Annelise Mccoy Kennon Holter, CMA

## 2015-03-04 ENCOUNTER — Other Ambulatory Visit: Payer: Self-pay | Admitting: Family Medicine

## 2017-01-18 ENCOUNTER — Encounter (HOSPITAL_COMMUNITY): Payer: Self-pay | Admitting: Emergency Medicine

## 2017-01-18 ENCOUNTER — Emergency Department (HOSPITAL_COMMUNITY): Payer: Medicaid Other

## 2017-01-18 ENCOUNTER — Emergency Department (HOSPITAL_COMMUNITY)
Admission: EM | Admit: 2017-01-18 | Discharge: 2017-01-18 | Disposition: A | Discharge status: Home / Self Care | Payer: Medicaid Other | Attending: Emergency Medicine | Admitting: Emergency Medicine

## 2017-01-18 DIAGNOSIS — Y999 Unspecified external cause status: Secondary | ICD-10-CM | POA: Diagnosis not present

## 2017-01-18 DIAGNOSIS — Z794 Long term (current) use of insulin: Secondary | ICD-10-CM | POA: Diagnosis not present

## 2017-01-18 DIAGNOSIS — S42402A Unspecified fracture of lower end of left humerus, initial encounter for closed fracture: Secondary | ICD-10-CM

## 2017-01-18 DIAGNOSIS — I10 Essential (primary) hypertension: Secondary | ICD-10-CM | POA: Insufficient documentation

## 2017-01-18 DIAGNOSIS — Y939 Activity, unspecified: Secondary | ICD-10-CM | POA: Diagnosis not present

## 2017-01-18 DIAGNOSIS — S52102A Unspecified fracture of upper end of left radius, initial encounter for closed fracture: Secondary | ICD-10-CM | POA: Diagnosis not present

## 2017-01-18 DIAGNOSIS — S59902A Unspecified injury of left elbow, initial encounter: Secondary | ICD-10-CM | POA: Diagnosis present

## 2017-01-18 DIAGNOSIS — Y929 Unspecified place or not applicable: Secondary | ICD-10-CM | POA: Insufficient documentation

## 2017-01-18 DIAGNOSIS — E119 Type 2 diabetes mellitus without complications: Secondary | ICD-10-CM | POA: Diagnosis not present

## 2017-01-18 DIAGNOSIS — W109XXA Fall (on) (from) unspecified stairs and steps, initial encounter: Secondary | ICD-10-CM | POA: Insufficient documentation

## 2017-01-18 DIAGNOSIS — S50811A Abrasion of right forearm, initial encounter: Secondary | ICD-10-CM | POA: Diagnosis not present

## 2017-01-18 DIAGNOSIS — S42432A Displaced fracture (avulsion) of lateral epicondyle of left humerus, initial encounter for closed fracture: Secondary | ICD-10-CM | POA: Diagnosis not present

## 2017-01-18 DIAGNOSIS — Z7982 Long term (current) use of aspirin: Secondary | ICD-10-CM | POA: Diagnosis not present

## 2017-01-18 DIAGNOSIS — Z23 Encounter for immunization: Secondary | ICD-10-CM | POA: Insufficient documentation

## 2017-01-18 HISTORY — DX: Pure hyperglyceridemia: E78.1

## 2017-01-18 HISTORY — DX: Type 2 diabetes mellitus without complications: E11.9

## 2017-01-18 HISTORY — DX: Pure hypercholesterolemia, unspecified: E78.00

## 2017-01-18 HISTORY — DX: Essential (primary) hypertension: I10

## 2017-01-18 MED ORDER — OXYCODONE-ACETAMINOPHEN 5-325 MG PO TABS
1.0000 | ORAL_TABLET | Freq: Once | ORAL | Status: AC
  Administered 2017-01-18: 1 via ORAL
  Filled 2017-01-18: qty 1

## 2017-01-18 MED ORDER — OXYCODONE-ACETAMINOPHEN 5-325 MG PO TABS: 1.0000 | ORAL_TABLET | ORAL | 0 refills | Status: DC | PRN

## 2017-01-18 MED ORDER — TETANUS-DIPHTH-ACELL PERTUSSIS 5-2.5-18.5 LF-MCG/0.5 IM SUSP
0.5000 mL | Freq: Once | INTRAMUSCULAR | Status: AC
  Administered 2017-01-18: 0.5 mL via INTRAMUSCULAR
  Filled 2017-01-18: qty 0.5

## 2017-01-18 NOTE — ED Provider Notes (Signed)
[facility] DEPT Provider Note   CSN: [id number] Arrival date & time: 01/18/17  1649     History   Chief Complaint Chief Complaint  Patient presents with  . Arm Injury    HPI Steven [name] is a 56 y.o. male.  HPI   Steven [name] is a 56 y.o. male who presents to the Emergency Department complaining of pain, swelling to the left elbow.  He reports a mechanical fall last evening in which he landed on the his left arm.  States he felt a "pop" in his elbow and noticed immediate swelling.  Pain to the elbow with turning his forearm and with extension of the arm.  He also complains of an abrasion to the right forearm secondary to the fall.  He denies numbness of his fingers, wrist, neck or shoulder pain.  Last Td is unknown   Past Medical History:  Diagnosis Date  . Diabetes mellitus without complication ([place])   . High cholesterol   . High triglycerides   . Hypertension     Patient Active Problem List   Diagnosis Date Noted  . DIABETIC  RETINOPATHY 03/03/2009  . SLEEP APNEA 01/24/2009  . EDEMA 12/16/2008  . DIABETES MELLITUS, TYPE II 02/19/2008  . HYPERLIPIDEMIA 02/19/2008  . Morbid obesity ([place]) 02/19/2008  . DEPRESSION 02/19/2008  . HYPERTENSION 06/17/2007  . GERD 06/17/2007    Past Surgical History:  Procedure Laterality Date  . APPENDECTOMY         Home Medications    Prior to Admission medications   Medication Sig Start Date End Date Taking? Authorizing Provider  amLODipine (NORVASC) 10 MG tablet TAKE ONE TABLET BY MOUTH ONCE DAILY --  **NEEDS  VISIT  WITH  PRIMARY  CARE  PROVIDER  BEFORE  REFILLS  GIVEN** 12/01/14   Alveda Reasons, MD  aspirin EC 81 MG tablet TAKE ONE  BY MOUTH EVERY DAY 09/15/12   Alveda Reasons, MD  buPROPion (WELLBUTRIN XL) 150 MG 24 hr tablet TAKE ONE TABLET BY MOUTH EVERY DAY IN THE MORNING 08/20/14   Alveda Reasons, MD  esomeprazole (NEXIUM) 20 MG capsule Take 1 capsule (20 mg total) by mouth daily. 08/20/14 08/20/15   Alveda Reasons, MD  ezetimibe (ZETIA) 10 MG tablet Take 1 tablet (10 mg total) by mouth daily. 06/17/12   Alveda Reasons, MD  furosemide (LASIX) 20 MG tablet TAKE TWO TABLETS BY MOUTH TWICE DAILY AS NEEDED FOR EDEMA 12/03/14   Alveda Reasons, MD  glucose blood (ACCU-CHEK AVIVA) test strip Use as instructed.use to check blood sugar up to 3 times a day  Disp: 1 box 08/20/14   Alveda Reasons, MD  insulin glargine (LANTUS) 100 UNIT/ML injection Use 50 units of Lantus subQ BID - Please dispense QS for 1 month 12/01/14   Alveda Reasons, MD  insulin lispro (HUMALOG) 100 UNIT/ML injection Inject 0.3 mLs (30 Units total) into the skin 3 (three) times daily before meals. 12/01/14   Alveda Reasons, MD  Insulin Pen Needle 31G X 8 MM MISC 1 Container by Does not apply route QID. qid--any brand 08/20/14   Alveda Reasons, MD  Insulin Syringes, Disposable, U-100 1 ML MISC Use as directed with insulin 08/20/14   Alveda Reasons, MD  Lancets (ACCU-CHEK MULTICLIX) lancets Use as instructed.  check blood sugar up to 3 times a day Disp:  1 box 08/20/14   Alveda Reasons, MD  lisinopril-hydrochlorothiazide (PRINZIDE,ZESTORETIC) 20-25 MG per tablet  TAKE ONE TABLET BY MOUTH ONCE DAILY 12/01/14   Alveda Reasons, MD  metoprolol (TOPROL-XL) 200 MG 24 hr tablet TAKE ONE TABLET BY MOUTH EVERY DAY 12/01/14   Alveda Reasons, MD  omeprazole (PRILOSEC) 40 MG capsule TAKE ONE CAPSULE BY MOUTH EVERY DAY 08/20/14   Alveda Reasons, MD  pravastatin (PRAVACHOL) 40 MG tablet Take 1 tablet (40 mg total) by mouth daily. 05/12/12 05/12/13  Alveda Reasons, MD  simvastatin (ZOCOR) 40 MG tablet TAKE ONE TABLET BY MOUTH EVERY DAY AT BEDTIME 08/20/14   Alveda Reasons, MD    Family History History reviewed. No pertinent family history.  Social History Social History  Substance Use Topics  . Smoking status: Never Smoker  . Smokeless tobacco: Never Used  . Alcohol use Yes     Comment: occ     Allergies   Codeine and  Metformin   Review of Systems Review of Systems  Constitutional: Negative for chills and fever.  Respiratory: Negative for shortness of breath.   Cardiovascular: Negative for chest pain.  Gastrointestinal: Negative for nausea and vomiting.  Genitourinary: Negative for difficulty urinating and dysuria.  Musculoskeletal: Positive for arthralgias (left elbow pain and swelling) and joint swelling. Negative for back pain and neck pain.  Skin: Negative for color change and wound (abrasion to right forearm).  Neurological: Negative for weakness and numbness.  All other systems reviewed and are negative.    Physical Exam Updated Vital Signs BP (!) 176/90 (BP Location: Right Arm)   Pulse 82   Temp 98.7 F (37.1 C) (Oral)   Resp 18   Ht 5\' 10"  (1.778 m)   Wt (!) 149.7 kg   SpO2 99%   BMI 47.35 kg/m   Physical Exam  Constitutional: He is oriented to person, place, and time. He appears well-developed and well-nourished. No distress.  HENT:  Head: Normocephalic and atraumatic.  Eyes: EOM are normal. Pupils are equal, round, and reactive to light.  Neck: Normal range of motion.  Cardiovascular: Normal rate, regular rhythm and intact distal pulses.   Bilateral radial pulses palpable  Pulmonary/Chest: Effort normal and breath sounds normal. He exhibits no tenderness.  Musculoskeletal: He exhibits tenderness. He exhibits no edema.       Left elbow: He exhibits decreased range of motion and swelling. He exhibits no laceration. Tenderness found. Radial head and lateral epicondyle tenderness noted.  ttp of the left elbow.  Pt unable to full extend or supinate the left arm.  Mild edema of the elbow  Neurological: He is alert and oriented to person, place, and time. He exhibits normal muscle tone. Coordination normal.  Skin: Skin is warm and dry.  Large. Abrasion of the right forearm.  Appears to be healing well.  No drainage or edema.  Nursing note and vitals reviewed.    ED Treatments /  Results  Labs (all labs ordered are listed, but only abnormal results are displayed) Labs Reviewed - No data to display  EKG  EKG Interpretation None       Radiology Dg Elbow Complete Left  Result Date: 01/18/2017 CLINICAL DATA:  Left elbow pain, fall last night EXAM: LEFT ELBOW - COMPLETE 3+ VIEW COMPARISON:  None. FINDINGS: Four views of the left elbow submitted. There is significant soft tissue swelling dorsal elbow. Mild soft tissue swelling dorsal distal humeral region. There is small avulsion fracture distal humerus lateral epicondyle. The avulsed bony fragment measures about 7.6 mm. Mild impacted fracture of the radial head.  IMPRESSION: There is significant soft tissue swelling dorsal elbow. Mild soft tissue swelling dorsal distal humeral region. There is small avulsion fracture distal humerus lateral epicondyle. The avulsed bony fragment measures about 7.6 mm. Mild impacted fracture of the radial head. Electronically Signed   By: Lahoma Crocker M.D.   On: 01/18/2017 17:36    Procedures Procedures (including critical care time)  SPLINT APPLICATION Date/Time: 3:47 PM Authorized by: Hale Bogus. Consent: Verbal consent obtained. Risks and benefits: risks, benefits and alternatives were discussed Consent given by: patient Splint applied by: myself and nursing staff Location details: left elbow Splint type: long arm posterior splint Supplies used: ortho-glass, padding, and ACE wrap, sling  Post-procedure: The splinted body part was neurovascularly unchanged following the procedure. Patient tolerance: Patient tolerated the procedure well with no immediate complications.     Medications Ordered in ED Medications  oxyCODONE-acetaminophen (PERCOCET/ROXICET) 5-325 MG per tablet 1 tablet (not administered)  Tdap (BOOSTRIX) injection 0.5 mL (not administered)     Initial Impression / Assessment and Plan / ED Course  I have reviewed the triage vital signs and the nursing  notes.  Pertinent labs & imaging results that were available during my care of the patient were reviewed by me and considered in my medical decision making (see chart for details).     Rains Dr. Aline Brochure.  Recommended posterior long arm slpint and sling.  He agrees to see pt in his office next week.  Pt agrees to call on Monday for appt time.   Final Clinical Impressions(s) / ED Diagnoses   Final diagnoses:  Closed fracture of left elbow, initial encounter  Abrasion of right forearm, initial encounter    New Prescriptions New Prescriptions   No medications on file     Bufford Lope 01/22/17 1839    Isla Pence, MD 01/22/17 2238

## 2017-01-18 NOTE — Discharge Instructions (Signed)
Keep the elbow splinted, you may remove the sling as needed for bathing or changing clothes.  Call Dr. Ruthe Mannan office on Monday to arrange a follow-up  appt.

## 2017-01-18 NOTE — ED Triage Notes (Addendum)
Pt reports left arm pain since falling down a stair last night. Pt denies hitting head or loc, being on blood thinners. Abrasion noted to right forearm. Left radial pulse strong. Cap refill wnl. nad noted.

## 2017-01-18 NOTE — ED Notes (Signed)
Patient verbalizes understanding of discharge instructions, prescriptions, home care and follow up care. Patient out of department at this time. 

## 2017-01-18 NOTE — ED Triage Notes (Signed)
L arm pain at elbow area after fall last night  PCP Dr Fleet Contras in [place]

## 2017-01-21 ENCOUNTER — Ambulatory Visit (INDEPENDENT_AMBULATORY_CARE_PROVIDER_SITE_OTHER): Payer: Self-pay | Admitting: Orthopedic Surgery

## 2017-01-21 ENCOUNTER — Encounter: Payer: Self-pay | Admitting: Orthopedic Surgery

## 2017-01-21 VITALS — BP 134/70 | HR 102 | Ht 71.0 in | Wt 335.0 lb

## 2017-01-21 DIAGNOSIS — S52125A Nondisplaced fracture of head of left radius, initial encounter for closed fracture: Secondary | ICD-10-CM

## 2017-01-21 DIAGNOSIS — S53105A Unspecified dislocation of left ulnohumeral joint, initial encounter: Secondary | ICD-10-CM

## 2017-01-21 MED ORDER — OXYCODONE-ACETAMINOPHEN 5-325 MG PO TABS: 1.0000 | ORAL_TABLET | ORAL | 0 refills | Status: DC | PRN

## 2017-01-21 MED ORDER — IBUPROFEN 800 MG PO TABS: 800.0000 mg | ORAL_TABLET | Freq: Three times a day (TID) | ORAL | 1 refills | Status: DC | PRN

## 2017-01-21 NOTE — Progress Notes (Signed)
Patient ID: Steven [name], male   DOB: December 30, 1960, 56 y.o.   MRN: [id number]  Chief Complaint  Patient presents with  . Elbow Injury    left elbow injury, DOI 01/17/17    HPI Steven [name] is a 55 y.o. male. The patient presents for evaluation of his left elbow he fell off of a stare at his friend's house  Left elbow pain. Severe in left side with throbbing and aching injury is now 22 days old.  He was placed in a posterior splint. X-ray show terrible triad but no dislocation of the elbow Review of Systems Review of Systems Denies numbness or tingling, denies chest pain shortness of breath he does have an abrasion of the skin of the right arm   Past Medical History:  Diagnosis Date  . Diabetes mellitus without complication ([place])   . High cholesterol   . High triglycerides   . Hypertension     Past Surgical History:  Procedure Laterality Date  . APPENDECTOMY      No family history on file.  Social History Social History  Substance Use Topics  . Smoking status: Never Smoker  . Smokeless tobacco: Never Used  . Alcohol use Yes     Comment: occ    Allergies  Allergen Reactions  . Codeine   . Metformin     Current Outpatient Prescriptions  Medication Sig Dispense Refill  . amLODipine (NORVASC) 10 MG tablet TAKE ONE TABLET BY MOUTH ONCE DAILY --  **NEEDS  VISIT  WITH  PRIMARY  CARE  PROVIDER  BEFORE  REFILLS  GIVEN** 30 tablet 0  . aspirin EC 81 MG tablet TAKE ONE  BY MOUTH EVERY DAY 30 tablet 10  . BUMETANIDE PO Take by mouth.    . furosemide (LASIX) 20 MG tablet TAKE TWO TABLETS BY MOUTH TWICE DAILY AS NEEDED FOR EDEMA 180 tablet 0  . glucose blood (ACCU-CHEK AVIVA) test strip Use as instructed.use to check blood sugar up to 3 times a day  Disp: 1 box 100 each 11  . HYDRALAZINE HCL PO Take by mouth.    . insulin lispro (HUMALOG) 100 UNIT/ML injection Inject 0.3 mLs (30 Units total) into the skin 3 (three) times daily before meals. 3 vial 0  . Insulin Pen Needle  31G X 8 MM MISC 1 Container by Does not apply route QID. qid--any brand 100 each 11  . Insulin Syringes, Disposable, U-100 1 ML MISC Use as directed with insulin 100 each 3  . Lancets (ACCU-CHEK MULTICLIX) lancets Use as instructed.  check blood sugar up to 3 times a day Disp:  1 box 100 each 11  . lisinopril-hydrochlorothiazide (PRINZIDE,ZESTORETIC) 20-25 MG per tablet TAKE ONE TABLET BY MOUTH ONCE DAILY 30 tablet 0  . oxyCODONE-acetaminophen (PERCOCET/ROXICET) 5-325 MG tablet Take 1 tablet by mouth every 4 (four) hours as needed. 15 tablet 0  . ibuprofen (ADVIL,MOTRIN) 800 MG tablet Take 1 tablet (800 mg total) by mouth every 8 (eight) hours as needed. 90 tablet 1  . oxyCODONE-acetaminophen (ROXICET) 5-325 MG tablet Take 1 tablet by mouth every 4 (four) hours as needed for severe pain. 30 tablet 0   No current facility-administered medications for this visit.        Physical Exam  BP 134/70   Pulse (!) 102   Ht 5\' 11"  (1.803 m)   Wt (!) 335 lb (152 kg)   BMI 46.72 kg/m   Physical Exam  Constitutional: He is oriented to person, place,  and time. He appears well-developed and well-nourished. No distress.  Cardiovascular: Normal rate and intact distal pulses.   Neurological: He is alert and oriented to person, place, and time.  Skin: Skin is warm and dry. No rash noted. He is not diaphoretic. No erythema. No pallor.  Psychiatric: He has a normal mood and affect. His behavior is normal. Judgment and thought content normal.    Ortho Exam  His ambulation is normal  He is moderately to severely obese  His elbow looks reduced his bony anatomy seems to line up around the elbow using the olecranon and medial and lateral epicondyles. It is held at 90 of flexion I did not move it I could not test stability muscle tone was normal skin on the left side normal abrasion over the right forearm distal pulses on the right and left side were normal there was no lymph nodes palpable on either  epitrochlear region of either elbow sensation was normal in both hands  He was able to walk with normal balance Data Reviewed The elbow shows a reduced elbow on x-ray. He does have an avulsion of the lateral epicondyle or radial head fracture and a cornoid avulsion nondisplaced  Assessment  Encounter Diagnoses  Name Primary?  . Dislocation of left elbow, initial encounter Yes  . Closed nondisplaced fracture of head of left radius, initial encounter     Probable elbow dislocation with terrible triad   Plan I reapplied a splint posteriorly. Follow-up one week x-ray in the splint  Arther Abbott, MD 01/21/2017 3:27 PM

## 2017-01-22 ENCOUNTER — Telehealth: Payer: Self-pay | Admitting: Orthopedic Surgery

## 2017-01-22 NOTE — Telephone Encounter (Signed)
Patient called with quesiton about "temporary cast" (splint) - states he can straighten his arm all the way out while in the splint.  Also states he is removing it while sleeping and while relaxing.  Office note from yesterday, 01/21/17 indicates: " Plan   I reapplied a splint posteriorly. Follow-up one week x-ray in the splint" Patient's ph# [phone]

## 2017-01-22 NOTE — Telephone Encounter (Signed)
Please give appointment with dr Luna Glasgow tomorrow evening for new splint application, the point of the splint is to keep immobilized... Should not be straightening arm or taking splint off

## 2017-01-23 NOTE — Telephone Encounter (Signed)
Called back to patient; left voice message requesting call back as soon as possible.

## 2017-01-23 NOTE — Telephone Encounter (Signed)
Patient called back; states would not have been able to come until tomorrow anyway (01/24/17); appointment scheduled.

## 2017-01-24 ENCOUNTER — Ambulatory Visit (INDEPENDENT_AMBULATORY_CARE_PROVIDER_SITE_OTHER): Payer: Self-pay | Admitting: Orthopaedic Surgery

## 2017-01-24 ENCOUNTER — Encounter: Payer: Self-pay | Admitting: Orthopaedic Surgery

## 2017-01-24 VITALS — BP 158/91 | HR 91 | Temp 98.1°F | Ht 71.0 in | Wt 370.0 lb

## 2017-01-24 DIAGNOSIS — S53105D Unspecified dislocation of left ulnohumeral joint, subsequent encounter: Secondary | ICD-10-CM

## 2017-01-24 DIAGNOSIS — S52125D Nondisplaced fracture of head of left radius, subsequent encounter for closed fracture with routine healing: Secondary | ICD-10-CM

## 2017-01-24 NOTE — Progress Notes (Signed)
CC:  My splint is broken  He was seen in the office on Monday by Dr. Aline Brochure for elbow problem.  He was placed in a posterior splint.  The splint has cracked at the elbow area and is unstable.  The patient called to tell us this.  He was told to come in and have splint replaced.  He has no new trauma.  He has not removed the splint.  He has no increased pain or have any numbness.  NV intact.  Splint is broken.  Pain is controlled.  Encounter Diagnoses  Name Primary?  . Dislocation of left elbow, subsequent encounter Yes  . Closed nondisplaced fracture of head of left radius with routine healing, subsequent encounter   . Morbid obesity ([place])     A new posterior splint is applied.  He is to keep appointment for next week.  He is to have x-rays then in the splint per Dr. Ruthe Mannan notes.  Call if any problem.  Precautions discussed.  Electronically Signed Sanjuana Kava, MD 4/19/201810:08 AM

## 2017-01-24 NOTE — Patient Instructions (Signed)
Cast or Splint Care, Adult Casts and splints are supports that are worn to protect broken bones and other injuries. A cast or splint may hold a bone still and in the correct position while it heals. Casts and splints may also help to ease pain, swelling, and muscle spasms. How to care for your cast  Do not stick anything inside the cast to scratch your skin.  Check the skin around the cast every day. Tell your doctor about any concerns.  You may put lotion on dry skin around the edges of the cast. Do not put lotion on the skin under the cast.  Keep the cast clean.  If the cast is not waterproof:  Do not let it get wet.  Cover it with a watertight covering when you take a bath or a shower. How to care for your splint  Wear it as told by your doctor. Take it off only as told by your doctor.  Loosen the splint if your fingers or toes tingle, get numb, or turn cold and blue.  Keep the splint clean.  If the splint is not waterproof:  Do not let it get wet.  Cover it with a watertight covering when you take a bath or a shower. Follow these instructions at home: Bathing   Do not take baths or swim until your doctor says it is okay. Ask your doctor if you can take showers. You may only be allowed to take sponge baths for bathing.  If your cast or splint is not waterproof, cover it with a watertight covering when you take a bath or shower. Managing pain, stiffness, and swelling   Move your fingers or toes often to avoid stiffness and to lessen swelling.  Raise (elevate) the injured area above the level of your heart while sitting or lying down. Safety   Do not use the injured limb to support your body weight until your doctor says that it is okay.  Use crutches or other assistive devices as told by your doctor. General instructions   Do not put pressure on any part of the cast or splint until it is fully hardened. This may take many hours.  Return to your normal activities as  told by your doctor. Ask your doctor what activities are safe for you.  Keep all follow-up visits as told by your doctor. This is important. Contact a doctor if:  Your cast or splint gets damaged.  The skin around the cast gets red or raw.  The skin under the cast is very itchy or painful.  Your cast or splint feels very uncomfortable.  Your cast or splint is too tight or too loose.  Your cast becomes wet or it starts to have a soft spot or area.  You get an object stuck under your cast. Get help right away if:  Your pain gets worse.  The injured area tingles, gets numb, or turns blue and cold.  The part of your body above or below the cast is swollen and it turns a different color (is discolored).  You cannot feel or move your fingers or toes.  There is fluid leaking through the cast.  You have very bad pain or pressure under the cast.  You have trouble breathing.  You have shortness of breath.  You have chest pain. This information is not intended to replace advice given to you by your health care provider. Make sure you discuss any questions you have with your health care provider.  Document Released: 01/24/2011 Document Revised: 09/14/2016 Document Reviewed: 09/14/2016 Elsevier Interactive Patient Education  2017 [employer].

## 2017-01-25 ENCOUNTER — Telehealth: Payer: Self-pay | Admitting: Orthopedic Surgery

## 2017-01-25 NOTE — Telephone Encounter (Signed)
Patient states his arm is hurting and getting worse, small finger is going numb, feels like funny bone pain that does not go away, states this has been this way since splint was changed in office yesterday.   Advised patient ER for xrays and splint change keep follow up appt next week

## 2017-01-25 NOTE — Telephone Encounter (Signed)
Patient called to say that his elbow feels like it is out of socket when he moves it. He wanted to speak with the nurse.    Please call and advise

## 2017-01-30 ENCOUNTER — Telehealth: Payer: Self-pay | Admitting: Orthopedic Surgery

## 2017-01-30 NOTE — Telephone Encounter (Signed)
Patient called today stating that his elbow still feels like it is out of socket.  It's still hurting.   He is taking his pain meds.  What can he do?  He has an appointment here this coming Friday morning with Dr. Aline Brochure.

## 2017-01-30 NOTE — Telephone Encounter (Signed)
Go to ER or wait until [facility]

## 2017-01-30 NOTE — Telephone Encounter (Signed)
Please advise 

## 2017-01-30 NOTE — Telephone Encounter (Signed)
Routing to Dr. Harrison to advise 

## 2017-01-31 MED ORDER — HYDROCODONE-ACETAMINOPHEN 5-325 MG PO TABS: ORAL_TABLET | ORAL | 0 refills | Status: DC

## 2017-01-31 NOTE — Telephone Encounter (Signed)
Patient aware. Patient is aware of appointment tomorrow, 02/01/17, with Dr Aline Brochure, however, he relays he is out of pain medication as of yesterday.  States Dr Aline Brochure initially said to return "the early part of the week" and that he was given Friday for his appointment.  Asking if Dr Luna Glasgow would write him his refill since he is "behind" and out of medication:  oxyCODONE-acetaminophen (ROXICET) 5-325 MG tablet 30 tablet

## 2017-01-31 NOTE — Telephone Encounter (Signed)
Called patient, x2 3:55pm and 4:05pm, left urgent messages to call/come by as his request has been approved.

## 2017-01-31 NOTE — Telephone Encounter (Signed)
Rx written for Norco 5.  He got medicine filled in [place] on the 18th from Dr. Aline Brochure.

## 2017-02-01 ENCOUNTER — Ambulatory Visit (INDEPENDENT_AMBULATORY_CARE_PROVIDER_SITE_OTHER): Payer: Self-pay | Admitting: Orthopedic Surgery

## 2017-02-01 ENCOUNTER — Ambulatory Visit (INDEPENDENT_AMBULATORY_CARE_PROVIDER_SITE_OTHER): Payer: Self-pay

## 2017-02-01 ENCOUNTER — Encounter: Payer: Self-pay | Admitting: Orthopedic Surgery

## 2017-02-01 DIAGNOSIS — S53105D Unspecified dislocation of left ulnohumeral joint, subsequent encounter: Secondary | ICD-10-CM

## 2017-02-01 NOTE — Progress Notes (Signed)
This is a follow-up visit  The patient had a dislocation of his left elbow with a terrible triad type injury  His splint broke. We reapplied the splint and rewrapped it  However he comes in with the elbow greater than 90 flexion No neurovascular deficits are noted in the left upper extremity   Repeat x-rays show subluxation of the left elbow  I put a new splint on with further elbow flexion but at this point I will send him for evaluation for possible surgical treatment on the left elbow for this terrible triad injury  New splint applied  Prescription given for pain medication  Precautions given.  Encounter Diagnosis  Name Primary?  . Dislocation of left elbow, subsequent encounter Yes   His injuries include radial head fracture, lateral epicondyle avulsion and coronoid fracture avulsion

## 2017-02-05 ENCOUNTER — Encounter (HOSPITAL_COMMUNITY): Payer: Self-pay | Admitting: Emergency Medicine

## 2017-02-05 ENCOUNTER — Telehealth: Payer: Self-pay | Admitting: Orthopedic Surgery

## 2017-02-05 ENCOUNTER — Emergency Department (HOSPITAL_COMMUNITY): Payer: Medicaid Other

## 2017-02-05 ENCOUNTER — Emergency Department (HOSPITAL_COMMUNITY)
Admission: EM | Admit: 2017-02-05 | Discharge: 2017-02-05 | Disposition: A | Discharge status: Home / Self Care | Payer: Medicaid Other | Attending: Emergency Medicine | Admitting: Emergency Medicine

## 2017-02-05 DIAGNOSIS — W1839XD Other fall on same level, subsequent encounter: Secondary | ICD-10-CM | POA: Diagnosis not present

## 2017-02-05 DIAGNOSIS — Z791 Long term (current) use of non-steroidal anti-inflammatories (NSAID): Secondary | ICD-10-CM | POA: Diagnosis not present

## 2017-02-05 DIAGNOSIS — I10 Essential (primary) hypertension: Secondary | ICD-10-CM | POA: Diagnosis not present

## 2017-02-05 DIAGNOSIS — Z79899 Other long term (current) drug therapy: Secondary | ICD-10-CM | POA: Insufficient documentation

## 2017-02-05 DIAGNOSIS — Z794 Long term (current) use of insulin: Secondary | ICD-10-CM | POA: Insufficient documentation

## 2017-02-05 DIAGNOSIS — S42402G Unspecified fracture of lower end of left humerus, subsequent encounter for fracture with delayed healing: Secondary | ICD-10-CM | POA: Insufficient documentation

## 2017-02-05 DIAGNOSIS — E119 Type 2 diabetes mellitus without complications: Secondary | ICD-10-CM | POA: Diagnosis not present

## 2017-02-05 DIAGNOSIS — S59902D Unspecified injury of left elbow, subsequent encounter: Secondary | ICD-10-CM | POA: Diagnosis present

## 2017-02-05 MED ORDER — OXYCODONE-ACETAMINOPHEN 5-325 MG PO TABS: 2.0000 | ORAL_TABLET | ORAL | 0 refills | Status: DC | PRN

## 2017-02-05 NOTE — Telephone Encounter (Signed)
I spoke with patient as well, at this time, and he stated he was trying to hold off returning to the Emergency room, but would do so, and is aware of scheduled appointment.

## 2017-02-05 NOTE — Telephone Encounter (Signed)
Referred to [facility] on Friday, they were to call him from their financial dept since he is self pay, advise to make sure he answers

## 2017-02-05 NOTE — ED Provider Notes (Signed)
[facility] DEPT Provider Note   CSN: [id number] Arrival date & time: 02/05/17  1754     History   Chief Complaint Chief Complaint  Patient presents with  . Fall    HPI Steven [name] is a 56 y.o. male.  The history is provided by the patient. No language interpreter was used.  Fall  This is a recurrent problem. The problem occurs constantly. The problem has been gradually worsening. Nothing aggravates the symptoms. Nothing relieves the symptoms. The treatment provided no relief.   Pt fractured elbow 2 weeks ago. He was diagnosed with terrible triad injury   Pt reports he fell again today and hit elbow.  Pt is in a splint.  Pt reports splint broke.  He is concerned about further injury.   Past Medical History:  Diagnosis Date  . Diabetes mellitus without complication ([place])   . High cholesterol   . High triglycerides   . Hypertension     Patient Active Problem List   Diagnosis Date Noted  . DIABETIC  RETINOPATHY 03/03/2009  . SLEEP APNEA 01/24/2009  . EDEMA 12/16/2008  . DIABETES MELLITUS, TYPE II 02/19/2008  . HYPERLIPIDEMIA 02/19/2008  . Morbid obesity ([place]) 02/19/2008  . DEPRESSION 02/19/2008  . HYPERTENSION 06/17/2007  . GERD 06/17/2007    Past Surgical History:  Procedure Laterality Date  . APPENDECTOMY         Home Medications    Prior to Admission medications   Medication Sig Start Date End Date Taking? Authorizing Provider  amLODipine (NORVASC) 10 MG tablet TAKE ONE TABLET BY MOUTH ONCE DAILY --  **NEEDS  VISIT  WITH  PRIMARY  CARE  PROVIDER  BEFORE  REFILLS  GIVEN** 12/01/14   Alveda Reasons, MD  aspirin EC 81 MG tablet TAKE ONE  BY MOUTH EVERY DAY 09/15/12   Alveda Reasons, MD  BUMETANIDE PO Take by mouth.    Historical Provider, MD  furosemide (LASIX) 20 MG tablet TAKE TWO TABLETS BY MOUTH TWICE DAILY AS NEEDED FOR EDEMA 12/03/14   Alveda Reasons, MD  glucose blood (ACCU-CHEK AVIVA) test strip Use as instructed.use to check blood sugar up  to 3 times a day  Disp: 1 box 08/20/14   Alveda Reasons, MD  HYDRALAZINE HCL PO Take by mouth.    Historical Provider, MD  HYDROcodone-acetaminophen (NORCO/VICODIN) 5-325 MG tablet One tablet every four hours as needed for acute pain.  Limit of five days per [place] statue. 01/31/17   Sanjuana Kava, MD  ibuprofen (ADVIL,MOTRIN) 800 MG tablet Take 1 tablet (800 mg total) by mouth every 8 (eight) hours as needed. 01/21/17   Carole Civil, MD  insulin lispro (HUMALOG) 100 UNIT/ML injection Inject 0.3 mLs (30 Units total) into the skin 3 (three) times daily before meals. 12/01/14   Alveda Reasons, MD  Insulin Pen Needle 31G X 8 MM MISC 1 Container by Does not apply route QID. qid--any brand 08/20/14   Alveda Reasons, MD  Insulin Syringes, Disposable, U-100 1 ML MISC Use as directed with insulin 08/20/14   Alveda Reasons, MD  Lancets (ACCU-CHEK MULTICLIX) lancets Use as instructed.  check blood sugar up to 3 times a day Disp:  1 box 08/20/14   Alveda Reasons, MD  lisinopril-hydrochlorothiazide (PRINZIDE,ZESTORETIC) 20-25 MG per tablet TAKE ONE TABLET BY MOUTH ONCE DAILY 12/01/14   Alveda Reasons, MD  oxyCODONE-acetaminophen (PERCOCET/ROXICET) 5-325 MG tablet Take 1 tablet by mouth every 4 (four) hours as needed.  01/18/17   Tammy Triplett, PA-C  oxyCODONE-acetaminophen (ROXICET) 5-325 MG tablet Take 1 tablet by mouth every 4 (four) hours as needed for severe pain. 01/21/17   Carole Civil, MD    Family History History reviewed. No pertinent family history.  Social History Social History  Substance Use Topics  . Smoking status: Never Smoker  . Smokeless tobacco: Never Used  . Alcohol use Yes     Comment: occ     Allergies   Codeine and Metformin   Review of Systems Review of Systems  All other systems reviewed and are negative.    Physical Exam Updated Vital Signs BP (!) 161/76 (BP Location: Right Arm)   Pulse 86   Temp 98.1 F (36.7 C) (Oral)   Resp 18   Ht 5'  11" (1.803 m)   Wt (!) 167.8 kg   SpO2 100%   BMI 51.60 kg/m   Physical Exam  Constitutional: He appears well-developed and well-nourished.  Musculoskeletal: He exhibits tenderness.  Tender left elbow  Swollen hand,  Good pulses.  Splint broken at elbow   Neurological: He is alert.  Skin: Skin is warm.  Nursing note and vitals reviewed.    ED Treatments / Results  Labs (all labs ordered are listed, but only abnormal results are displayed) Labs Reviewed - No data to display  EKG  EKG Interpretation None       Radiology Dg Elbow Complete Left  Result Date: 02/05/2017 CLINICAL DATA:  Left elbow pain after new injury to the left elbow. Known radial head fracture sustained 2 weeks ago. EXAM: LEFT ELBOW - COMPLETE 3+ VIEW COMPARISON:  Radiographs from 02/01/2017 and 01/18/2017 FINDINGS: A posterior plaster splint obscures fine bony detail. There is a slightly depressed radial head fracture with similar appearance to the study from 02/01/2017 but with further displacement of a small fracture fragment along the volar aspect of the elbow joint. No new fracture lucencies are seen. No dislocation of the elbow joint. Small joint effusion with anterior posterior fat pad signs. IMPRESSION: Comminuted appearing fracture involving the radial head with further volar displacement of a small fracture fragment best seen on the lateral view. Stable mild depression of the radial head is noted since 02/01/2017 comparison. Small joint effusion. Electronically Signed   By: Ashley Royalty M.D.   On: 02/05/2017 19:50    Procedures .Splint Application Date/Time: 06/12/2840 8:19 PM Performed by: Fransico Meadow Authorized by: Fransico Meadow     (including critical care time)  Medications Ordered in ED Medications - No data to display   Initial Impression / Assessment and Plan / ED Course  I have reviewed the triage vital signs and the nursing notes.  Pertinent labs & imaging results that were  available during my care of the patient were reviewed by me and considered in my medical decision making (see chart for details).     Xray shows no change in fracture.  Splint replaced.   Pt advised to follow up with Orthopaedist   Final Clinical Impressions(s) / ED Diagnoses   Final diagnoses:  Closed fracture of left elbow with delayed healing, subsequent encounter    New Prescriptions New Prescriptions   OXYCODONE-ACETAMINOPHEN (PERCOCET/ROXICET) 5-325 MG TABLET    Take 2 tablets by mouth every 4 (four) hours as needed for severe pain.  An After Visit Summary was printed and given to the patient.   Hollace Kinnier [place], PA-C 02/05/17 2025    Milton Ferguson, MD 02/06/17 [phone]

## 2017-02-05 NOTE — Telephone Encounter (Signed)
Patient called to inquire about status of referral to the other orthopaedist for further evaluation and/or possible surgery.  His ph# is [phone]

## 2017-02-05 NOTE — ED Notes (Signed)
Pt alert & oriented x4, stable gait. Patient given discharge instructions, paperwork & prescription(s). Patient  instructed to stop at the registration desk to finish any additional paperwork. Patient verbalized understanding. Pt left department w/ no further questions. 

## 2017-02-05 NOTE — ED Notes (Signed)
Left hand swelling , per pain worse since fall today

## 2017-02-05 NOTE — ED Triage Notes (Signed)
Pt states he broke his left elbow last week and then today was walking on the sidewalk and stepped off of the edge and rolled his ankle.  States he fell on his elbow again and is having increased pain.  Orthoglass in place.

## 2017-02-05 NOTE — Telephone Encounter (Signed)
Relayed the information to patient as noted.

## 2017-02-11 ENCOUNTER — Telehealth: Payer: Self-pay | Admitting: Orthopedic Surgery

## 2017-02-11 NOTE — Telephone Encounter (Signed)
Patient states, since he is still waiting to hear from [facility] regarding referral, he is out of pain medication:  HYDROcodone-acetaminophen (NORCO/VICODIN) 5-325 MG tablet 30 tablet   - please advise.

## 2017-02-12 ENCOUNTER — Other Ambulatory Visit: Payer: Self-pay | Admitting: Orthopedic Surgery

## 2017-02-12 DIAGNOSIS — M25522 Pain in left elbow: Secondary | ICD-10-CM

## 2017-02-12 MED ORDER — HYDROCODONE-ACETAMINOPHEN 5-325 MG PO TABS: ORAL_TABLET | ORAL | 0 refills | Status: DC

## 2017-02-12 NOTE — Progress Notes (Unsigned)
[place] controlled substance reporting system reviewed  Meds ordered this encounter  Medications  . HYDROcodone-acetaminophen (NORCO/VICODIN) 5-325 MG tablet    Sig: One tablet every four hours as needed for acute pain.  Limit of five days per [place] statue.    Dispense:  30 tablet    Refill:  0

## 2017-02-12 NOTE — Telephone Encounter (Signed)
Will follow up with them again today, may we refill

## 2017-02-14 NOTE — Telephone Encounter (Signed)
Patient requesting refill on pain medication, he is working with [facility] before they will give him an appt.

## 2017-02-14 NOTE — Telephone Encounter (Signed)
ok 

## 2017-02-15 ENCOUNTER — Other Ambulatory Visit: Payer: Self-pay | Admitting: *Deleted

## 2017-02-15 DIAGNOSIS — M25522 Pain in left elbow: Secondary | ICD-10-CM

## 2017-02-15 MED ORDER — HYDROCODONE-ACETAMINOPHEN 5-325 MG PO TABS: ORAL_TABLET | ORAL | 0 refills | Status: DC

## 2017-02-27 ENCOUNTER — Inpatient Hospital Stay
Admission: EM | Admit: 2017-02-27 | Discharge: 2017-03-07 | DRG: 982 | Disposition: A | Discharge status: Home Health | Payer: Medicaid Other | Attending: Internal Medicine | Admitting: Internal Medicine

## 2017-02-27 ENCOUNTER — Encounter: Payer: Self-pay | Admitting: Intensive Care

## 2017-02-27 ENCOUNTER — Emergency Department: Payer: Medicaid Other

## 2017-02-27 DIAGNOSIS — L02611 Cutaneous abscess of right foot: Secondary | ICD-10-CM | POA: Diagnosis present

## 2017-02-27 DIAGNOSIS — L03119 Cellulitis of unspecified part of limb: Secondary | ICD-10-CM

## 2017-02-27 DIAGNOSIS — Z7982 Long term (current) use of aspirin: Secondary | ICD-10-CM | POA: Diagnosis not present

## 2017-02-27 DIAGNOSIS — E11621 Type 2 diabetes mellitus with foot ulcer: Secondary | ICD-10-CM | POA: Diagnosis present

## 2017-02-27 DIAGNOSIS — Z8249 Family history of ischemic heart disease and other diseases of the circulatory system: Secondary | ICD-10-CM

## 2017-02-27 DIAGNOSIS — IMO0002 Reserved for concepts with insufficient information to code with codable children: Secondary | ICD-10-CM

## 2017-02-27 DIAGNOSIS — N2581 Secondary hyperparathyroidism of renal origin: Secondary | ICD-10-CM | POA: Diagnosis present

## 2017-02-27 DIAGNOSIS — E1169 Type 2 diabetes mellitus with other specified complication: Principal | ICD-10-CM | POA: Diagnosis present

## 2017-02-27 DIAGNOSIS — K219 Gastro-esophageal reflux disease without esophagitis: Secondary | ICD-10-CM | POA: Diagnosis present

## 2017-02-27 DIAGNOSIS — E785 Hyperlipidemia, unspecified: Secondary | ICD-10-CM | POA: Diagnosis present

## 2017-02-27 DIAGNOSIS — Z6841 Body Mass Index (BMI) 40.0 and over, adult: Secondary | ICD-10-CM

## 2017-02-27 DIAGNOSIS — N179 Acute kidney failure, unspecified: Secondary | ICD-10-CM

## 2017-02-27 DIAGNOSIS — L0390 Cellulitis, unspecified: Secondary | ICD-10-CM | POA: Diagnosis present

## 2017-02-27 DIAGNOSIS — E8770 Fluid overload, unspecified: Secondary | ICD-10-CM | POA: Diagnosis present

## 2017-02-27 DIAGNOSIS — Z833 Family history of diabetes mellitus: Secondary | ICD-10-CM

## 2017-02-27 DIAGNOSIS — Z794 Long term (current) use of insulin: Secondary | ICD-10-CM

## 2017-02-27 DIAGNOSIS — E11628 Type 2 diabetes mellitus with other skin complications: Secondary | ICD-10-CM

## 2017-02-27 DIAGNOSIS — E11319 Type 2 diabetes mellitus with unspecified diabetic retinopathy without macular edema: Secondary | ICD-10-CM | POA: Diagnosis present

## 2017-02-27 DIAGNOSIS — E7800 Pure hypercholesterolemia, unspecified: Secondary | ICD-10-CM | POA: Diagnosis present

## 2017-02-27 DIAGNOSIS — W19XXXD Unspecified fall, subsequent encounter: Secondary | ICD-10-CM

## 2017-02-27 DIAGNOSIS — E1122 Type 2 diabetes mellitus with diabetic chronic kidney disease: Secondary | ICD-10-CM | POA: Diagnosis present

## 2017-02-27 DIAGNOSIS — L02619 Cutaneous abscess of unspecified foot: Secondary | ICD-10-CM

## 2017-02-27 DIAGNOSIS — E1121 Type 2 diabetes mellitus with diabetic nephropathy: Secondary | ICD-10-CM | POA: Diagnosis present

## 2017-02-27 DIAGNOSIS — D631 Anemia in chronic kidney disease: Secondary | ICD-10-CM | POA: Diagnosis present

## 2017-02-27 DIAGNOSIS — M869 Osteomyelitis, unspecified: Secondary | ICD-10-CM | POA: Diagnosis present

## 2017-02-27 DIAGNOSIS — N186 End stage renal disease: Secondary | ICD-10-CM | POA: Diagnosis present

## 2017-02-27 DIAGNOSIS — Z885 Allergy status to narcotic agent status: Secondary | ICD-10-CM | POA: Diagnosis not present

## 2017-02-27 DIAGNOSIS — G4730 Sleep apnea, unspecified: Secondary | ICD-10-CM | POA: Diagnosis present

## 2017-02-27 DIAGNOSIS — E875 Hyperkalemia: Secondary | ICD-10-CM | POA: Diagnosis present

## 2017-02-27 DIAGNOSIS — I120 Hypertensive chronic kidney disease with stage 5 chronic kidney disease or end stage renal disease: Secondary | ICD-10-CM | POA: Diagnosis present

## 2017-02-27 DIAGNOSIS — L03115 Cellulitis of right lower limb: Secondary | ICD-10-CM | POA: Diagnosis present

## 2017-02-27 DIAGNOSIS — I953 Hypotension of hemodialysis: Secondary | ICD-10-CM | POA: Diagnosis not present

## 2017-02-27 DIAGNOSIS — E781 Pure hyperglyceridemia: Secondary | ICD-10-CM | POA: Diagnosis present

## 2017-02-27 DIAGNOSIS — N189 Chronic kidney disease, unspecified: Secondary | ICD-10-CM

## 2017-02-27 DIAGNOSIS — Z992 Dependence on renal dialysis: Secondary | ICD-10-CM

## 2017-02-27 DIAGNOSIS — S52122G Displaced fracture of head of left radius, subsequent encounter for closed fracture with delayed healing: Secondary | ICD-10-CM

## 2017-02-27 DIAGNOSIS — N19 Unspecified kidney failure: Secondary | ICD-10-CM

## 2017-02-27 HISTORY — DX: Chronic kidney disease, unspecified: N18.9

## 2017-02-27 HISTORY — DX: Sleep apnea, unspecified: G47.30

## 2017-02-27 HISTORY — DX: Cutaneous abscess of right lower limb: L02.415

## 2017-02-27 HISTORY — DX: Localized edema: R60.0

## 2017-02-27 HISTORY — DX: Cellulitis of right lower limb: L03.115

## 2017-02-27 LAB — COMPREHENSIVE METABOLIC PANEL
ALBUMIN: 2.6 g/dL — AB (ref 3.5–5.0)
ALK PHOS: 129 U/L — AB (ref 38–126)
ALT: 17 U/L (ref 17–63)
ANION GAP: 13 (ref 5–15)
AST: 17 U/L (ref 15–41)
BILIRUBIN TOTAL: 0.3 mg/dL (ref 0.3–1.2)
BUN: 72 mg/dL — AB (ref 6–20)
CALCIUM: 8.6 mg/dL — AB (ref 8.9–10.3)
CO2: 22 mmol/L (ref 22–32)
CREATININE: 8.24 mg/dL — AB (ref 0.61–1.24)
Chloride: 99 mmol/L — ABNORMAL LOW (ref 101–111)
GFR calc Af Amer: 8 mL/min — ABNORMAL LOW (ref >60)
GFR calc non Af Amer: 6 mL/min — ABNORMAL LOW (ref >60)
GLUCOSE: 251 mg/dL — AB (ref 65–99)
Potassium: 5.3 mmol/L — ABNORMAL HIGH (ref 3.5–5.1)
Sodium: 134 mmol/L — ABNORMAL LOW (ref 135–145)
TOTAL PROTEIN: 7.6 g/dL (ref 6.5–8.1)

## 2017-02-27 LAB — CBC WITH DIFFERENTIAL/PLATELET
BASOS ABS: 0.1 10*3/uL (ref 0–0.1)
BASOS PCT: 1 %
EOS ABS: 0.2 10*3/uL (ref 0–0.7)
EOS PCT: 2 %
HCT: 28.1 % — ABNORMAL LOW (ref 40.0–52.0)
Hemoglobin: 9.4 g/dL — ABNORMAL LOW (ref 13.0–18.0)
Lymphocytes Relative: 6 %
Lymphs Abs: 0.7 10*3/uL — ABNORMAL LOW (ref 1.0–3.6)
MCH: 28.9 pg (ref 26.0–34.0)
MCHC: 33.5 g/dL (ref 32.0–36.0)
MCV: 86.1 fL (ref 80.0–100.0)
MONO ABS: 0.9 10*3/uL (ref 0.2–1.0)
Monocytes Relative: 8 %
Neutro Abs: 9.2 10*3/uL — ABNORMAL HIGH (ref 1.4–6.5)
Neutrophils Relative %: 83 %
PLATELETS: 337 10*3/uL (ref 150–440)
RBC: 3.26 MIL/uL — ABNORMAL LOW (ref 4.40–5.90)
RDW: 13.1 % (ref 11.5–14.5)
WBC: 11.1 10*3/uL — ABNORMAL HIGH (ref 3.8–10.6)

## 2017-02-27 LAB — GLUCOSE, CAPILLARY
GLUCOSE-CAPILLARY: 310 mg/dL — AB (ref 65–99)
Glucose-Capillary: 229 mg/dL — ABNORMAL HIGH (ref 65–99)

## 2017-02-27 LAB — URINALYSIS, COMPLETE (UACMP) WITH MICROSCOPIC
BILIRUBIN URINE: NEGATIVE
Bacteria, UA: NONE SEEN
Glucose, UA: >=500 mg/dL — AB
Ketones, ur: NEGATIVE mg/dL
LEUKOCYTES UA: NEGATIVE
Nitrite: NEGATIVE
PH: 5 (ref 5.0–8.0)
Protein, ur: >=300 mg/dL — AB
SPECIFIC GRAVITY, URINE: 1.012 (ref 1.005–1.030)

## 2017-02-27 LAB — LACTIC ACID, PLASMA: Lactic Acid, Venous: 1.3 mmol/L (ref 0.5–1.9)

## 2017-02-27 MED ORDER — INSULIN ASPART 100 UNIT/ML ~~LOC~~ SOLN: 0.0000 [IU] | Freq: Three times a day (TID) | SUBCUTANEOUS | Status: DC

## 2017-02-27 MED ORDER — INSULIN GLARGINE 100 UNIT/ML ~~LOC~~ SOLN
10.0000 [IU] | Freq: Every day | SUBCUTANEOUS | Status: DC
  Administered 2017-02-27 – 2017-02-28 (×2): 10 [IU] via SUBCUTANEOUS
  Filled 2017-02-27 (×4): qty 0.1

## 2017-02-27 MED ORDER — HYDRALAZINE HCL 25 MG PO TABS
25.0000 mg | ORAL_TABLET | Freq: Three times a day (TID) | ORAL | Status: DC
  Administered 2017-02-27 – 2017-03-01 (×4): 25 mg via ORAL
  Filled 2017-02-27 (×4): qty 1

## 2017-02-27 MED ORDER — SODIUM POLYSTYRENE SULFONATE 15 GM/60ML PO SUSP
30.0000 g | Freq: Once | ORAL | Status: AC
  Administered 2017-02-27: 30 g via ORAL
  Filled 2017-02-27: qty 120

## 2017-02-27 MED ORDER — AMLODIPINE BESYLATE 10 MG PO TABS: 10.0000 mg | ORAL_TABLET | Freq: Every day | ORAL | Status: DC

## 2017-02-27 MED ORDER — HEPARIN SODIUM (PORCINE) 5000 UNIT/ML IJ SOLN
5000.0000 [IU] | Freq: Three times a day (TID) | INTRAMUSCULAR | Status: DC
  Administered 2017-02-27 – 2017-03-07 (×20): 5000 [IU] via SUBCUTANEOUS
  Filled 2017-02-27 (×21): qty 1

## 2017-02-27 MED ORDER — PIPERACILLIN-TAZOBACTAM 3.375 G IVPB
3.3750 g | Freq: Two times a day (BID) | INTRAVENOUS | Status: DC
Start: 2017-02-28 — End: 2017-03-04
  Administered 2017-02-28 – 2017-03-03 (×8): 3.375 g via INTRAVENOUS
  Filled 2017-02-27 (×11): qty 50

## 2017-02-27 MED ORDER — OXYCODONE-ACETAMINOPHEN 5-325 MG PO TABS
2.0000 | ORAL_TABLET | Freq: Four times a day (QID) | ORAL | Status: DC | PRN
  Administered 2017-03-02 – 2017-03-06 (×6): 2 via ORAL
  Filled 2017-02-27 (×6): qty 2

## 2017-02-27 MED ORDER — AMLODIPINE BESYLATE 10 MG PO TABS
10.0000 mg | ORAL_TABLET | Freq: Every day | ORAL | Status: DC
  Administered 2017-02-28 – 2017-03-06 (×5): 10 mg via ORAL
  Filled 2017-02-27 (×5): qty 1

## 2017-02-27 MED ORDER — PIPERACILLIN-TAZOBACTAM 3.375 G IVPB 30 MIN
3.3750 g | Freq: Once | INTRAVENOUS | Status: AC
Start: 2017-02-27 — End: 2017-02-27
  Administered 2017-02-27: 3.375 g via INTRAVENOUS
  Filled 2017-02-27: qty 50

## 2017-02-27 MED ORDER — DOCUSATE SODIUM 100 MG PO CAPS: 100.0000 mg | ORAL_CAPSULE | Freq: Two times a day (BID) | ORAL | Status: DC | PRN

## 2017-02-27 MED ORDER — VANCOMYCIN HCL 10 G IV SOLR
2000.0000 mg | Freq: Once | INTRAVENOUS | Status: AC
  Administered 2017-02-27: 2000 mg via INTRAVENOUS
  Filled 2017-02-27: qty 2000

## 2017-02-27 MED ORDER — SODIUM BICARBONATE 8.4 % IV SOLN
50.0000 meq | Freq: Once | INTRAVENOUS | Status: AC
  Administered 2017-02-27: 50 meq via INTRAVENOUS
  Filled 2017-02-27: qty 50

## 2017-02-27 MED ORDER — ASPIRIN EC 81 MG PO TBEC
81.0000 mg | DELAYED_RELEASE_TABLET | Freq: Every day | ORAL | Status: DC
  Administered 2017-02-28 – 2017-03-07 (×7): 81 mg via ORAL
  Filled 2017-02-27 (×7): qty 1

## 2017-02-27 NOTE — ED Notes (Signed)
ED Provider at bedside. 

## 2017-02-27 NOTE — ED Notes (Signed)
Patient transported to X-ray 

## 2017-02-27 NOTE — ED Notes (Signed)
Admitting MD at bedside.

## 2017-02-27 NOTE — Progress Notes (Signed)
Pharmacy Antibiotic Note  Steven [name] is a 56 y.o. male admitted on 02/27/2017 with cellulitis.  Pharmacy has been consulted for vancomycin and piperacillin-tazobactam dosing.  Plan: Vancomycin 2000mg  IV x1 and piperacillin-tazobactam 3.375 g IV (30 min infusion) x1 ordered.   Will order piperacillin-tazobactam 3.375g IV Q12h (4 hour infusion).   Will follow-up to see if patient will need dialysis. If patient needs dialysis then will dose per dialysis dosing. If patient does not need dialysis then will order vancomycin 1250mg  IV Q48h per stacked dosing.   Vd= 76 Ke=0.017 T1/2=40.76  Height: 5\' 11"  (180.3 cm) Weight: (!) 350 lb 4.8 oz (158.9 kg) IBW/kg (Calculated) : 75.3  Temp (24hrs), Avg:99 F (37.2 C), Min:99 F (37.2 C), Max:99 F (37.2 C)   Recent Labs Lab 02/27/17 1718  WBC 11.1*  CREATININE 8.24*  LATICACIDVEN 1.3    Estimated Creatinine Clearance: 15.6 mL/min (A) (by C-G formula based on SCr of 8.24 mg/dL (H)).    Allergies  Allergen Reactions  . Codeine     Antimicrobials this admission: Vancomycin 5/23 >> Zosyn 5/23 >>  Microbiology results: 5/23 BCx: ordered  Thank you for allowing pharmacy to be a part of this patient's care.  Loree Fee, PharmD 02/27/2017 6:50 PM

## 2017-02-27 NOTE — H&P (Signed)
Steven [name] at [facility] NAME: Steven [name]    MR#:  [id number]  DATE OF BIRTH:  1961-05-29  DATE OF ADMISSION:  02/27/2017  PRIMARY CARE PHYSICIAN: [name], MD   REQUESTING/REFERRING PHYSICIAN: Darl Householder  CHIEF COMPLAINT:   Chief Complaint  Patient presents with  . Wound Check  . Leg Swelling  . Elbow Injury    HISTORY OF PRESENT ILLNESS: Steven [name]  is a 56 y.o. male with a known history of Chronic kidney disease, diabetes, high cholesterol, hypertension- had a fall 6 weeks ago and broke his elbow, went to his primary care doctor's office, x-rays showed Comminuted fracture and was advised to go to a tertiary care center and special. [name]. Due to not having insurance he could not see that orthopedic practice there and so waiting to work with his insurance issue and finally see the orthopedic doctors. He has chronic swelling on both his legs and his friend helping to take care of his feet for last few days his friend noted that his right Steven [name] was getting red and more swollen, also noted some yellowish discharge from there. Today he went to see his kidney Dr.lateef in office and he saw his feet and suggested to go to emergency room right away. In ER he is noted to have worsening in his kidney function and possible osteomyelitis on x-ray on his feet.  PAST MEDICAL HISTORY:   Past Medical History:  Diagnosis Date  . Chronic kidney disease   . Diabetes mellitus without complication ([place])   . High cholesterol   . High triglycerides   . Hypertension     PAST SURGICAL HISTORY: Past Surgical History:  Procedure Laterality Date  . APPENDECTOMY      SOCIAL HISTORY:  Social History  Substance Use Topics  . Smoking status: Never Smoker  . Smokeless tobacco: Never Used  . Alcohol use Yes     Comment: occ    FAMILY HISTORY:  Family History  Problem Relation Age of Onset  . CAD Father     DRUG ALLERGIES:  Allergies   Allergen Reactions  . Codeine Nausea And Vomiting    REVIEW OF SYSTEMS:   CONSTITUTIONAL: No fever, fatigue or weakness.  EYES: No blurred or double vision.  EARS, NOSE, AND THROAT: No tinnitus or ear pain.  RESPIRATORY: No cough, shortness of breath, wheezing or hemoptysis.  CARDIOVASCULAR: No chest pain, orthopnea, edema.  GASTROINTESTINAL: No nausea, vomiting, diarrhea or abdominal pain.  GENITOURINARY: No dysuria, hematuria.  ENDOCRINE: No polyuria, nocturia,  HEMATOLOGY: No anemia, easy bruising or bleeding SKIN: No rash or lesion. MUSCULOSKELETAL: No joint pain or arthritis.   NEUROLOGIC: No tingling, numbness, weakness.  PSYCHIATRY: No anxiety or depression.   MEDICATIONS AT HOME:  Prior to Admission medications   Medication Sig Start Date End Date Taking? Authorizing Provider  amLODipine (NORVASC) 10 MG tablet TAKE ONE TABLET BY MOUTH ONCE DAILY --  **NEEDS  VISIT  WITH  PRIMARY  CARE  PROVIDER  BEFORE  REFILLS  GIVEN** 12/01/14   Alveda Reasons, MD  aspirin EC 81 MG tablet TAKE ONE  BY MOUTH EVERY DAY 09/15/12   Alveda Reasons, MD  BUMETANIDE PO Take by mouth.    [provider]  furosemide (LASIX) 20 MG tablet TAKE TWO TABLETS BY MOUTH TWICE DAILY AS NEEDED FOR EDEMA 12/03/14   Alveda Reasons, MD  glucose blood (ACCU-CHEK AVIVA) test strip Use as instructed.use to check  blood sugar up to 3 times a day  Disp: 1 box 08/20/14   Alveda Reasons, MD  HYDRALAZINE HCL PO Take by mouth.    [provider]  HYDROcodone-acetaminophen (NORCO/VICODIN) 5-325 MG tablet One tablet every four hours as needed for acute pain.  Limit of five days per [place] statue. 02/15/17   Carole Civil, MD  ibuprofen (ADVIL,MOTRIN) 800 MG tablet Take 1 tablet (800 mg total) by mouth every 8 (eight) hours as needed. 01/21/17   Carole Civil, MD  insulin lispro (HUMALOG) 100 UNIT/ML injection Inject 0.3 mLs (30 Units total) into the skin 3 (three) times daily before  meals. 12/01/14   Alveda Reasons, MD  Insulin Pen Needle 31G X 8 MM MISC 1 Container by Does not apply route QID. qid--any brand 08/20/14   Alveda Reasons, MD  Insulin Syringes, Disposable, U-100 1 ML MISC Use as directed with insulin 08/20/14   Alveda Reasons, MD  Lancets (ACCU-CHEK MULTICLIX) lancets Use as instructed.  check blood sugar up to 3 times a day Disp:  1 box 08/20/14   Alveda Reasons, MD  lisinopril-hydrochlorothiazide (PRINZIDE,ZESTORETIC) 20-25 MG per tablet TAKE ONE TABLET BY MOUTH ONCE DAILY 12/01/14   Alveda Reasons, MD  oxyCODONE-acetaminophen (PERCOCET/ROXICET) 5-325 MG tablet Take 2 tablets by mouth every 4 (four) hours as needed for severe pain. 02/05/17   Fransico Meadow, PA-C      PHYSICAL EXAMINATION:   VITAL SIGNS: Blood pressure (!) 158/74, pulse (!) 103, temperature 99 F (37.2 C), temperature source Oral, resp. rate 14, height 5\' 11"  (1.803 m), weight (!) 158.9 kg (350 lb 4.8 oz), SpO2 94 %.  GENERAL:  56 y.o.-year-old patient lying in the bed with no acute distress.  EYES: Pupils equal, round, reactive to light and accommodation. No scleral icterus. Extraocular muscles intact.  HEENT: Head atraumatic, normocephalic. Oropharynx and nasopharynx clear.  NECK:  Supple, no jugular venous distention. No thyroid enlargement, no tenderness.  LUNGS: Normal breath sounds bilaterally, no wheezing, rales,rhonchi or crepitation. No use of accessory muscles of respiration.  CARDIOVASCULAR: S1, S2 normal. No murmurs, rubs, or gallops.  ABDOMEN: Soft, nontender, nondistended. Bowel sounds present. No organomegaly or mass.  EXTREMITIES: severe pedal edema- chronic, cyanosis, or clubbing. Redness on right foot. NEUROLOGIC: Cranial nerves II through XII are intact. Muscle strength 5/5 in all extremities. Sensation intact. Gait not checked.  PSYCHIATRIC: The patient is alert and oriented x 3.  SKIN: No obvious rash, lesion, or ulcer.   LABORATORY PANEL:    CBC  Recent Labs Lab 02/27/17 1718  WBC 11.1*  HGB 9.4*  HCT 28.1*  PLT 337  MCV 86.1  MCH 28.9  MCHC 33.5  RDW 13.1  LYMPHSABS 0.7*  MONOABS 0.9  EOSABS 0.2  BASOSABS 0.1   ------------------------------------------------------------------------------------------------------------------  Chemistries   Recent Labs Lab 02/27/17 1718  NA 134*  K 5.3*  CL 99*  CO2 22  GLUCOSE 251*  BUN 72*  CREATININE 8.24*  CALCIUM 8.6*  AST 17  ALT 17  ALKPHOS 129*  BILITOT 0.3   ------------------------------------------------------------------------------------------------------------------ estimated creatinine clearance is 15.6 mL/min (A) (by C-G formula based on SCr of 8.24 mg/dL (H)). ------------------------------------------------------------------------------------------------------------------ No results for input(s): TSH, T4TOTAL, T3FREE, THYROIDAB in the last 72 hours.  Invalid input(s): FREET3   Coagulation profile No results for input(s): INR, PROTIME in the last 168 hours. ------------------------------------------------------------------------------------------------------------------- No results for input(s): DDIMER in the last 72 hours. -------------------------------------------------------------------------------------------------------------------  Cardiac Enzymes No results for  input(s): CKMB, TROPONINI, MYOGLOBIN in the last 168 hours.  Invalid input(s): CK ------------------------------------------------------------------------------------------------------------------ Invalid input(s): POCBNP  ---------------------------------------------------------------------------------------------------------------  Urinalysis    Component Value Date/Time   COLORURINE YELLOW (A) 02/27/2017 1718   APPEARANCEUR HAZY (A) 02/27/2017 1718   LABSPEC 1.012 02/27/2017 1718   PHURINE 5.0 02/27/2017 1718   GLUCOSEU >=500 (A) 02/27/2017 1718   HGBUR SMALL (A)  02/27/2017 1718   BILIRUBINUR NEGATIVE 02/27/2017 1718   KETONESUR NEGATIVE 02/27/2017 1718   PROTEINUR >=300 (A) 02/27/2017 1718   NITRITE NEGATIVE 02/27/2017 1718   LEUKOCYTESUR NEGATIVE 02/27/2017 1718     RADIOLOGY: Dg Chest 2 View  Result Date: 02/27/2017 CLINICAL DATA:  56 y/o  M; weakness. EXAM: CHEST  2 VIEW COMPARISON:  None. FINDINGS: The heart size and mediastinal contours are within normal limits. Both lungs are clear. The visualized skeletal structures are unremarkable. IMPRESSION: No active cardiopulmonary disease. Electronically Signed   By: Kristine Garbe M.D.   On: 02/27/2017 19:21   Dg Foot Complete Right  Result Date: 02/27/2017 CLINICAL DATA:  Wound at the right third through fifth metatarsals, with severe edema. Initial encounter. EXAM: RIGHT FOOT COMPLETE - 3+ VIEW COMPARISON:  None. FINDINGS: There is no evidence of fracture or dislocation. The joint spaces are preserved. There is no evidence of talar subluxation; the subtalar joint is unremarkable in appearance. An os peroneum is noted. There is question of vague lucency at the medial aspects of the fourth and fifth metatarsal heads. Mild osteomyelitis cannot be excluded. Diffuse soft tissue edema is noted about the forefoot and dorsum of the midfoot. Scattered soft tissue air is noted tracking about the lateral aspect of the forefoot. Would correlate clinically to exclude necrotizing fasciitis. IMPRESSION: 1. No evidence of fracture or dislocation. 2. Os peroneum noted. 3. Question of vague lucency at the medial aspects of the fourth and fifth metatarsal heads. Mild osteomyelitis cannot be excluded. 4. Scattered soft tissue air tracking about the lateral aspect of the forefoot. Would correlate clinically to exclude necrotizing fasciitis These results were called by telephone at the time of interpretation on 02/27/2017 at 7:22 pm to Dr. Shirlyn Goltz, who verbally acknowledged these results. Electronically Signed   By:  Garald Balding M.D.   On: 02/27/2017 19:24    EKG: Orders placed or performed during the hospital encounter of 02/27/17  . EKG 12-Lead  . EKG 12-Lead    IMPRESSION AND PLAN:  * Cellulitis on right foot, possible osteomyelitis   IV vancomycin and Zosyn   Podiatry and vascular consult.  * Acute on chronic renal failure, CKD stage V   Nephrology consult , hold nephrotoxic medication.  * Hypertension   Continue home medicines.   Hold lisinopril and hydrochlorothiazide.  * Diabetes   Insulin sliding scale coverage and Lantus.  * Anemia   Likely due to chronic kidney disease.  All the records are reviewed and case discussed with ED provider. Management plans discussed with the patient, family and they are in agreement.  CODE STATUS: Full code Code Status History    This patient does not have a recorded code status. Please follow your organizational policy for patients in this situation.       TOTAL TIME TAKING CARE OF THIS PATIENT: 50 minutes.    Vaughan Basta M.D on 02/27/2017   Between 7am to 6pm - Pager - [phone]  After 6pm go to [email or web address] - password [email or web address] [facility]  Office  [phone]  CC: Primary care physician; [place],  Samul Dada, MD   Note: This dictation was prepared with Dragon dictation along with smaller phrase technology. Any transcriptional errors that result from this process are unintentional.

## 2017-02-27 NOTE — ED Provider Notes (Signed)
[facility] Provider Note   CSN: [id number] Arrival date & time: 02/27/17  1709     History   Chief Complaint Chief Complaint  Patient presents with  . Wound Check  . Leg Swelling  . Elbow Injury    HPI Steven [name] is a 56 y.o. male hx of DM, HTN, Here presenting with worsening leg swelling, left elbow pain, right foot cellulitis. Patient states that he fell about 6 weeks ago and went to [facility] was diagnosed with elbow fracture. He was placed in a splint and was referred to orthopedic doctor. He is our orthopedic doctor Forestine Na was referred to [facility] however he was unable to see the with. Dr. at [facility] since he does not have insurance. About 2 weeks ago, he noticed that he had a pimple in his right foot that popped and since then has becoming more red and swollen. Has some subjective chills for several days. He saw his primary doctor today and was sent in for evaluation. He states that he is still urinating okay and he has baseline CK D. He is still taking Lasix 80 mg daily. Denies worsening shortness of breath or chest pain   The history is provided by the patient.    Past Medical History:  Diagnosis Date  . Chronic kidney disease   . Diabetes mellitus without complication ([place])   . High cholesterol   . High triglycerides   . Hypertension     Patient Active Problem List   Diagnosis Date Noted  . Cellulitis in diabetic foot ([place]) 02/27/2017  . Acute on chronic renal failure ([place]) 02/27/2017  . Cellulitis 02/27/2017  . DIABETIC  RETINOPATHY 03/03/2009  . SLEEP APNEA 01/24/2009  . EDEMA 12/16/2008  . DIABETES MELLITUS, TYPE II 02/19/2008  . HYPERLIPIDEMIA 02/19/2008  . Morbid obesity ([place]) 02/19/2008  . DEPRESSION 02/19/2008  . HYPERTENSION 06/17/2007  . GERD 06/17/2007    Past Surgical History:  Procedure Laterality Date  . APPENDECTOMY         Home Medications    Prior to Admission medications   Medication Sig Start  Date End Date Taking? Authorizing Provider  amLODipine (NORVASC) 10 MG tablet TAKE ONE TABLET BY MOUTH ONCE DAILY --  **NEEDS  VISIT  WITH  PRIMARY  CARE  PROVIDER  BEFORE  REFILLS  GIVEN** 12/01/14  Yes Alveda Reasons, MD  aspirin EC 81 MG tablet TAKE ONE  BY MOUTH EVERY DAY 09/15/12  Yes Alveda Reasons, MD  furosemide (LASIX) 20 MG tablet TAKE TWO TABLETS BY MOUTH TWICE DAILY AS NEEDED FOR EDEMA Patient taking differently: No sig reported 12/03/14  Yes Alveda Reasons, MD  HYDRALAZINE HCL PO Take 50 mg by mouth 3 (three) times daily.    Yes [provider]  HYDROcodone-acetaminophen (NORCO/VICODIN) 5-325 MG tablet One tablet every four hours as needed for acute pain.  Limit of five days per [place]. 02/15/17  Yes Carole Civil, MD  insulin aspart protamine- aspart (NOVOLOG MIX 70/30) (70-30) 100 UNIT/ML injection Inject 40-150 Units into the skin 2 (two) times daily with a meal.   Yes [provider]  lisinopril (PRINIVIL,ZESTRIL) 40 MG tablet Take 40 mg by mouth daily.    Yes [provider]  glucose blood (ACCU-CHEK AVIVA) test strip Use as instructed.use to check blood sugar up to 3 times a day  Disp: 1 box 08/20/14   Alveda Reasons, MD  ibuprofen (ADVIL,MOTRIN) 800 MG tablet Take 1  tablet (800 mg total) by mouth every 8 (eight) hours as needed. 01/21/17   Carole Civil, MD  insulin lispro (HUMALOG) 100 UNIT/ML injection Inject 0.3 mLs (30 Units total) into the skin 3 (three) times daily before meals. Patient not taking: Reported on 02/27/2017 12/01/14   Alveda Reasons, MD  Insulin Pen Needle 31G X 8 MM MISC 1 Container by Does not apply route QID. qid--any brand 08/20/14   Alveda Reasons, MD  Insulin Syringes, Disposable, U-100 1 ML MISC Use as directed with insulin 08/20/14   Alveda Reasons, MD  Lancets (ACCU-CHEK MULTICLIX) lancets Use as instructed.  check blood sugar up to 3 times a day Disp:  1 box 08/20/14   Alveda Reasons, MD    lisinopril-hydrochlorothiazide (PRINZIDE,ZESTORETIC) 20-25 MG per tablet TAKE ONE TABLET BY MOUTH ONCE DAILY Patient not taking: Reported on 02/27/2017 12/01/14   Alveda Reasons, MD  oxyCODONE-acetaminophen (PERCOCET/ROXICET) 5-325 MG tablet Take 2 tablets by mouth every 4 (four) hours as needed for severe pain. 02/05/17   Fransico Meadow, PA-C    Family History Family History  Problem Relation Age of Onset  . CAD Father     Social History Social History  Substance Use Topics  . Smoking status: Never Smoker  . Smokeless tobacco: Never Used  . Alcohol use Yes     Comment: occ     Allergies   Codeine   Review of Systems Review of Systems  Musculoskeletal:       R foot pain, L elbow pain   Skin: Positive for wound.  All other systems reviewed and are negative.    Physical Exam Updated Vital Signs BP (!) 158/74   Pulse (!) 103   Temp 99 F (37.2 C) (Oral)   Resp 14   Ht 5\' 11"  (1.803 m)   Wt (!) 158.9 kg (350 lb 4.8 oz)   SpO2 94%   BMI 48.86 kg/m   Physical Exam  Constitutional: He is oriented to person, place, and time.  Chronically ill, anasarca   HENT:  Head: Normocephalic.  Mouth/Throat: Oropharynx is clear and moist.  Eyes: EOM are normal. Pupils are equal, round, and reactive to light.  Neck: Normal range of motion. Neck supple.  Cardiovascular: Normal rate, regular rhythm and normal heart sounds.   Pulmonary/Chest: Effort normal and breath sounds normal.  Diminished bilateral bases   Abdominal: Soft. Bowel sounds are normal.  Musculoskeletal:  L elbow with known fracture. L elbow swollen as expected, L forearm swollen, 2+ pulses, dec ROM L elbow. Splint in place, no obvious pressure ulcer or cellulitis. R foot large area of fluctuance with surrounding cellulitis, diminished pulses R foot   Neurological: He is alert and oriented to person, place, and time.  Skin: Skin is warm. There is erythema.  Psychiatric: He has a normal mood and affect.   Nursing note and vitals reviewed.    ED Treatments / Results  Labs (all labs ordered are listed, but only abnormal results are displayed) Labs Reviewed  COMPREHENSIVE METABOLIC PANEL - Abnormal; Notable for the following:       Result Value   Sodium 134 (*)    Potassium 5.3 (*)    Chloride 99 (*)    Glucose, Bld 251 (*)    BUN 72 (*)    Creatinine, Ser 8.24 (*)    Calcium 8.6 (*)    Albumin 2.6 (*)    Alkaline Phosphatase 129 (*)    GFR calc  non Af Amer 6 (*)    GFR calc Af Amer 8 (*)    All other components within normal limits  CBC WITH DIFFERENTIAL/PLATELET - Abnormal; Notable for the following:    WBC 11.1 (*)    RBC 3.26 (*)    Hemoglobin 9.4 (*)    HCT 28.1 (*)    Neutro Abs 9.2 (*)    Lymphs Abs 0.7 (*)    All other components within normal limits  URINALYSIS, COMPLETE (UACMP) WITH MICROSCOPIC - Abnormal; Notable for the following:    Color, Urine YELLOW (*)    APPearance HAZY (*)    Glucose, UA >=500 (*)    Hgb urine dipstick SMALL (*)    Protein, ur >=300 (*)    Squamous Epithelial / LPF 0-5 (*)    All other components within normal limits  GLUCOSE, CAPILLARY - Abnormal; Notable for the following:    Glucose-Capillary 229 (*)    All other components within normal limits  CULTURE, BLOOD (ROUTINE X 2)  CULTURE, BLOOD (ROUTINE X 2)  LACTIC ACID, PLASMA  HEMOGLOBIN A1C    EKG  EKG Interpretation None      ED ECG REPORT I, Wandra Arthurs, the attending physician, personally viewed and interpreted this ECG.   Date: 02/27/2017  EKG Time: 19:12 pm  Rate: 108  Rhythm: sinus tachycardia  Axis: normal  Intervals:none  ST&T Change: nonspecific     Radiology Dg Chest 2 View  Result Date: 02/27/2017 CLINICAL DATA:  56 y/o  M; weakness. EXAM: CHEST  2 VIEW COMPARISON:  None. FINDINGS: The heart size and mediastinal contours are within normal limits. Both lungs are clear. The visualized skeletal structures are unremarkable. IMPRESSION: No active  cardiopulmonary disease. Electronically Signed   By: Kristine Garbe M.D.   On: 02/27/2017 19:21   Dg Elbow Complete Left  Result Date: 02/27/2017 CLINICAL DATA:  Left elbow pain.  Known left elbow fracture. EXAM: LEFT ELBOW - COMPLETE 3+ VIEW COMPARISON:  01/18/2017 and 02/05/2017 FINDINGS: Left elbow is in a fiberglass cast or splint. There appears to be increased impaction of the comminuted radial head fracture. Alignment of the elbow is abnormal with posterior subluxation of the elbow. Again noted is a displaced fracture involving the distal humerus lateral epicondyle. There also appears to be a fracture along the medial aspect of the olecranon or possibly the coronoid process. IMPRESSION: Abnormal alignment of the left elbow with posterior subluxation. Increased displacement and impaction of the radial head fracture. There appears to be additional fractures involving the distal humeral lateral epicondyle and probably the olecranon or coronoid process. Electronically Signed   By: Markus Daft M.D.   On: 02/27/2017 19:31   Dg Foot Complete Right  Result Date: 02/27/2017 CLINICAL DATA:  Wound at the right third through fifth metatarsals, with severe edema. Initial encounter. EXAM: RIGHT FOOT COMPLETE - 3+ VIEW COMPARISON:  None. FINDINGS: There is no evidence of fracture or dislocation. The joint spaces are preserved. There is no evidence of talar subluxation; the subtalar joint is unremarkable in appearance. An os peroneum is noted. There is question of vague lucency at the medial aspects of the fourth and fifth metatarsal heads. Mild osteomyelitis cannot be excluded. Diffuse soft tissue edema is noted about the forefoot and dorsum of the midfoot. Scattered soft tissue air is noted tracking about the lateral aspect of the forefoot. Would correlate clinically to exclude necrotizing fasciitis. IMPRESSION: 1. No evidence of fracture or dislocation. 2. Os peroneum noted.  3. Question of vague lucency at  the medial aspects of the fourth and fifth metatarsal heads. Mild osteomyelitis cannot be excluded. 4. Scattered soft tissue air tracking about the lateral aspect of the forefoot. Would correlate clinically to exclude necrotizing fasciitis These results were called by telephone at the time of interpretation on 02/27/2017 at 7:22 pm to Dr. Shirlyn Goltz, who verbally acknowledged these results. Electronically Signed   By: Garald Balding M.D.   On: 02/27/2017 19:24    Procedures Procedures (including critical care time)  CRITICAL CARE Performed by: Wandra Arthurs   Total critical care time:30 minutes  Critical care time was exclusive of separately billable procedures and treating other patients.  Critical care was necessary to treat or prevent imminent or life-threatening deterioration.  Critical care was time spent personally by me on the following activities: development of treatment plan with patient and/or surrogate as well as nursing, discussions with consultants, evaluation of patient's response to treatment, examination of patient, obtaining history from patient or surrogate, ordering and performing treatments and interventions, ordering and review of laboratory studies, ordering and review of radiographic studies, pulse oximetry and re-evaluation of patient's condition.   Medications Ordered in ED Medications  piperacillin-tazobactam (ZOSYN) IVPB 3.375 g (3.375 g Intravenous New Bag/Given 02/27/17 1922)  vancomycin (VANCOCIN) 2,000 mg in sodium chloride 0.9 % 500 mL IVPB (not administered)  piperacillin-tazobactam (ZOSYN) IVPB 3.375 g (not administered)  sodium polystyrene (KAYEXALATE) 15 GM/60ML suspension 30 g (not administered)  insulin aspart (novoLOG) injection 0-9 Units (not administered)  insulin glargine (LANTUS) injection 10 Units (not administered)  sodium bicarbonate injection 50 mEq (50 mEq Intravenous Given 02/27/17 1920)     Initial Impression / Assessment and Plan / ED  Course  I have reviewed the triage vital signs and the nursing notes.  Pertinent labs & imaging results that were available during my care of the patient were reviewed by me and considered in my medical decision making (see chart for details).    Steven [name] is a 56 y.o. male here with R foot pain and swelling, continued L elbow pain. L elbow fracture 6 weeks ago, still has splint on but no new trauma. However, diffuse leg swelling is worsening and R foot pain and cellulitis is new. I am concerned for possible abscess vs osteo. Will do sepsis workup and give vanc/zosyn. Will get xrays.   7:15 pm WBC 11. Lactate nl. Cr 8.2. K 5.3. Patient appears volume overloaded. In care everywhere, most recent Cr was 4.4 in 11/17. He appears septic from the R leg infection, ordered vanc/zosyn. I discussed case with Dr. Abigail Butts from nephrology, who is aware of patient coming to the ED. He felt that he doesn't need emergent dialysis and that putting in dialysis catheter in the setting of sepsis may potentially lead to infected catheter. I held 30 cc/kg bolus since he appears volume overloaded from his renal failure. He is hyperkalemic to 5.3 but no EKG changes. Will give IV bicarb for now. Hospitalist to admit.   7:49 PM Of note, Xray showed possible osteomyelitis. There is some air in the subcutaneous tissue. However, he has an obvious abscess there and the swelling is stable for 2 weeks. Also lactate nl, WBC 11. I doubt nec fasciitis. Likely bad abscess with cellulitis. Will not need emergent amputation. Will try abx for now and podiatry consult in AM.    Final Clinical Impressions(s) / ED Diagnoses   Final diagnoses:  Renal failure, unspecified chronicity  Abscess  or cellulitis of foot  Hyperkalemia    New Prescriptions New Prescriptions   No medications on file     Drenda Freeze, MD 02/27/17 1950

## 2017-02-27 NOTE — ED Triage Notes (Signed)
Patient reports having open wound on R foot X2 weeks, severe bilateral leg swelling. Patient broke his elbow 5-6 weeks ago and still has fiber glass present on arm. Has not seen a specialist for his elbow because patient states "they refused to treat me because I dont have insurance"  Patient takes 80 mg lasix twice a day.  HX stage 4 kidney disease. A&O x4. Pt states "my doctor sent me here to have my wound checked and I might need to start the process for dialysis"

## 2017-02-28 DIAGNOSIS — E119 Type 2 diabetes mellitus without complications: Secondary | ICD-10-CM

## 2017-02-28 DIAGNOSIS — N189 Chronic kidney disease, unspecified: Secondary | ICD-10-CM

## 2017-02-28 DIAGNOSIS — I10 Essential (primary) hypertension: Secondary | ICD-10-CM

## 2017-02-28 LAB — IRON AND TIBC
IRON: 30 ug/dL — AB (ref 45–182)
Saturation Ratios: 15 % — ABNORMAL LOW (ref 17.9–39.5)
TIBC: 205 ug/dL — AB (ref 250–450)
UIBC: 175 ug/dL

## 2017-02-28 LAB — GLUCOSE, CAPILLARY
GLUCOSE-CAPILLARY: 259 mg/dL — AB (ref 65–99)
GLUCOSE-CAPILLARY: 276 mg/dL — AB (ref 65–99)
GLUCOSE-CAPILLARY: 243 mg/dL — AB (ref 65–99)
GLUCOSE-CAPILLARY: 311 mg/dL — AB (ref 65–99)
Glucose-Capillary: 332 mg/dL — ABNORMAL HIGH (ref 65–99)
Glucose-Capillary: 250 mg/dL — ABNORMAL HIGH (ref 65–99)

## 2017-02-28 LAB — PROTEIN / CREATININE RATIO, URINE
Creatinine, Urine: 62 mg/dL
Protein Creatinine Ratio: 9.89 mg/mg{Cre} — ABNORMAL HIGH (ref 0.00–0.15)
Total Protein, Urine: 613 mg/dL

## 2017-02-28 LAB — BASIC METABOLIC PANEL
ANION GAP: 10 (ref 5–15)
BUN: 71 mg/dL — AB (ref 6–20)
CHLORIDE: 101 mmol/L (ref 101–111)
CO2: 24 mmol/L (ref 22–32)
Calcium: 8.1 mg/dL — ABNORMAL LOW (ref 8.9–10.3)
Creatinine, Ser: 8.03 mg/dL — ABNORMAL HIGH (ref 0.61–1.24)
GFR calc Af Amer: 8 mL/min — ABNORMAL LOW (ref >60)
GFR, EST NON AFRICAN AMERICAN: 7 mL/min — AB (ref >60)
Glucose, Bld: 291 mg/dL — ABNORMAL HIGH (ref 65–99)
POTASSIUM: 4.8 mmol/L (ref 3.5–5.1)
Sodium: 135 mmol/L (ref 135–145)

## 2017-02-28 LAB — CBC
HEMATOCRIT: 27.7 % — AB (ref 40.0–52.0)
HEMOGLOBIN: 9.4 g/dL — AB (ref 13.0–18.0)
MCH: 29.3 pg (ref 26.0–34.0)
MCHC: 33.9 g/dL (ref 32.0–36.0)
MCV: 86.5 fL (ref 80.0–100.0)
Platelets: 332 10*3/uL (ref 150–440)
RBC: 3.19 MIL/uL — ABNORMAL LOW (ref 4.40–5.90)
RDW: 13.4 % (ref 11.5–14.5)
WBC: 6.7 10*3/uL (ref 3.8–10.6)

## 2017-02-28 LAB — PHOSPHORUS: Phosphorus: 6.4 mg/dL — ABNORMAL HIGH (ref 2.5–4.6)

## 2017-02-28 LAB — SURGICAL PCR SCREEN
MRSA, PCR: NEGATIVE
Staphylococcus aureus: POSITIVE — AB

## 2017-02-28 LAB — FERRITIN: Ferritin: 143 ng/mL (ref 24–336)

## 2017-02-28 MED ORDER — VANCOMYCIN HCL IN DEXTROSE 1-5 GM/200ML-% IV SOLN
1000.0000 mg | INTRAVENOUS | Status: DC | PRN
  Administered 2017-02-28 – 2017-03-02 (×3): 1000 mg via INTRAVENOUS
  Filled 2017-02-28 (×3): qty 200

## 2017-02-28 MED ORDER — TUBERCULIN PPD 5 UNIT/0.1ML ID SOLN
5.0000 [IU] | Freq: Once | INTRADERMAL | Status: AC
  Administered 2017-02-28: 5 [IU] via INTRADERMAL
  Filled 2017-02-28: qty 0.1

## 2017-02-28 MED ORDER — VANCOMYCIN HCL IN DEXTROSE 1-5 GM/200ML-% IV SOLN
1000.0000 mg | INTRAVENOUS | Status: DC | PRN
  Filled 2017-02-28: qty 200

## 2017-02-28 MED ORDER — INSULIN GLARGINE 100 UNIT/ML ~~LOC~~ SOLN
30.0000 [IU] | Freq: Every day | SUBCUTANEOUS | Status: DC
  Administered 2017-03-02 – 2017-03-07 (×6): 30 [IU] via SUBCUTANEOUS
  Filled 2017-02-28 (×7): qty 0.3

## 2017-02-28 MED ORDER — INSULIN ASPART 100 UNIT/ML ~~LOC~~ SOLN
0.0000 [IU] | Freq: Three times a day (TID) | SUBCUTANEOUS | Status: DC
  Administered 2017-02-28 (×2): 5 [IU] via SUBCUTANEOUS
  Administered 2017-02-28: 7 [IU] via SUBCUTANEOUS
  Administered 2017-02-28 (×2): 3 [IU] via SUBCUTANEOUS
  Filled 2017-02-28: qty 3
  Filled 2017-02-28: qty 5
  Filled 2017-02-28: qty 3
  Filled 2017-02-28: qty 7
  Filled 2017-02-28: qty 5

## 2017-02-28 MED ORDER — INSULIN ASPART 100 UNIT/ML ~~LOC~~ SOLN: 0.0000 [IU] | Freq: Three times a day (TID) | SUBCUTANEOUS | Status: DC

## 2017-02-28 NOTE — Consult Note (Signed)
Patient Demographics  Steven [name], is a 56 y.o. male   MRN: [id number]   DOB - 1961-03-20  Admit Date - 02/27/2017    Outpatient Primary MD for the patient is [name], MD  Consult requested in the Hospital by Fritzi Mandes, MD, On 02/28/2017    Reason for consult: cellulitis abscess to the right foot   With History of -  Past Medical History:  Diagnosis Date  . Chronic kidney disease   . Diabetes mellitus without complication ([place])   . High cholesterol   . High triglycerides   . Hypertension       Past Surgical History:  Procedure Laterality Date  . APPENDECTOMY      in for   Chief Complaint  Patient presents with  . Wound Check  . Leg Swelling  . Elbow Injury     HPI  Steven [name]  is a 56 y.o. male,A cemented the Hospital last night with chief complaint of infection cellulitis of the right foot. Patient is diabetic and is on dialysis. States he notices starting to develop a couple weeks ago he usually gets blisters because of some chronic lymphedema and he usually disburse them himself and this time and work    Review of Systems : Patient's alert and well-oriented pleasant. He is morbidly obese.  In addition to the HPI above,  No Fever-chills, No Headache, No changes with Vision or hearing, No problems swallowing food or Liquids, No Chest pain, Cough or Shortness of Breath, No Abdominal pain, No Nausea or Vommitting, Bowel movements are regular, No Blood in stool or Urine, No dysuria, Skin to right lower extremity shows some redness especially dorsum of the foot extending up into the lower leg. No new joints pains-aches,  No new weakness, tingling, numbness in any extremity, No recent weight gain or loss, No polyuria, polydypsia or polyphagia, No significant Mental  Stressors. Patient has significant lymphedema to both lower extremities. Significant amount of fluid was gathered and dorsum of both feet and legs. Patient does wear compression stockings most of the time.. A full 10 point Review of Systems was done, except as stated above, all other Review of Systems were negative.   Social History Social History  Substance Use Topics  . Smoking status: Never Smoker  . Smokeless tobacco: Never Used  . Alcohol use Yes     Comment: occ     Family History Family History  Problem Relation Age of Onset  . CAD Father       Scheduled Meds: . amLODipine  10 mg Oral Daily  . aspirin EC  81 mg Oral Daily  . heparin  5,000 Units Subcutaneous Q8H  . hydrALAZINE  25 mg Oral Q8H  . insulin aspart  0-9 Units Subcutaneous TID WC & HS  . [START ON 03/01/2017] insulin glargine  30 Units Subcutaneous Daily  . tuberculin  5 Units Intradermal Once   Continuous Infusions: . piperacillin-tazobactam (ZOSYN)  IV Stopped (02/28/17 1400)  . vancomycin     PRN Meds:.docusate sodium, oxyCODONE-acetaminophen, vancomycin  Allergies  Allergen Reactions  . Codeine Nausea And Vomiting    Physical Exam  Vitals  Blood pressure (!) 151/85,  pulse (!) 115, temperature 99 F (37.2 C), temperature source Oral, resp. rate 16, height 5\' 10"  (1.778 m), weight (!) 156 kg (343 lb 14.7 oz), SpO2 98 %.  Lower Extremity exam:  Vascular:DP pulses are difficult to palpate due to his extreme lymphedema in both lower extremities.  Dermatological: Patient has a wound on the dorsum of the right foot approximately 3 cm in length and approximate 5 cm in width expanding across the dorsum of his foot. There is necrosis to the central portion of the wound. With pressure along the sides of the foot and dorsum of the foot there is exposed significant amount of purulent drainage from the area also. Redness and cellulitis is noted to the dorsum of the right foot extending all the way up to  the ankle lower leg.  Neurological: Patient is sensate to a certain extent he has some feeling with palpation to the region. Light touch and vibratory sensation was not tested this timeframe.  Ortho: Deep wound uncertain as to whether or not involves any type of bone at this timeframe. Does have significant infection needs to be controlled. X-rays reviewed by me show no evidence of bone involvement.  Data Review  CBC  Recent Labs Lab 02/27/17 1718 02/28/17 0443  WBC 11.1* 6.7  HGB 9.4* 9.4*  HCT 28.1* 27.7*  PLT 337 332  MCV 86.1 86.5  MCH 28.9 29.3  MCHC 33.5 33.9  RDW 13.1 13.4  LYMPHSABS 0.7*  --   MONOABS 0.9  --   EOSABS 0.2  --   BASOSABS 0.1  --    ------------------------------------------------------------------------------------------------------------------  Chemistries   Recent Labs Lab 02/27/17 1718 02/28/17 0443  NA 134* 135  K 5.3* 4.8  CL 99* 101  CO2 22 24  GLUCOSE 251* 291*  BUN 72* 71*  CREATININE 8.24* 8.03*  CALCIUM 8.6* 8.1*  AST 17  --   ALT 17  --   ALKPHOS 129*  --   BILITOT 0.3  --    ------------------------------------------------------------------------------------------------------ Urinalysis    Component Value Date/Time   COLORURINE YELLOW (A) 02/27/2017 1718   APPEARANCEUR HAZY (A) 02/27/2017 1718   LABSPEC 1.012 02/27/2017 1718   PHURINE 5.0 02/27/2017 1718   GLUCOSEU >=500 (A) 02/27/2017 1718   HGBUR SMALL (A) 02/27/2017 1718   BILIRUBINUR NEGATIVE 02/27/2017 1718   KETONESUR NEGATIVE 02/27/2017 1718   PROTEINUR >=300 (A) 02/27/2017 1718   NITRITE NEGATIVE 02/27/2017 1718   LEUKOCYTESUR NEGATIVE 02/27/2017 1718     Imaging results:   Dg Chest 2 View  Result Date: 02/27/2017 CLINICAL DATA:  56 y/o  M; weakness. EXAM: CHEST  2 VIEW COMPARISON:  None. FINDINGS: The heart size and mediastinal contours are within normal limits. Both lungs are clear. The visualized skeletal structures are unremarkable. IMPRESSION: No  active cardiopulmonary disease. Electronically Signed   By: Kristine Garbe M.D.   On: 02/27/2017 19:21   Dg Elbow Complete Left  Result Date: 02/27/2017 CLINICAL DATA:  Left elbow pain.  Known left elbow fracture. EXAM: LEFT ELBOW - COMPLETE 3+ VIEW COMPARISON:  01/18/2017 and 02/05/2017 FINDINGS: Left elbow is in a fiberglass cast or splint. There appears to be increased impaction of the comminuted radial head fracture. Alignment of the elbow is abnormal with posterior subluxation of the elbow. Again noted is a displaced fracture involving the distal humerus lateral epicondyle. There also appears to be a fracture along the medial aspect of the olecranon or possibly the coronoid process. IMPRESSION: Abnormal alignment of the left  elbow with posterior subluxation. Increased displacement and impaction of the radial head fracture. There appears to be additional fractures involving the distal humeral lateral epicondyle and probably the olecranon or coronoid process. Electronically Signed   By: Markus Daft M.D.   On: 02/27/2017 19:31   Dg Foot Complete Right  Result Date: 02/27/2017 CLINICAL DATA:  Wound at the right third through fifth metatarsals, with severe edema. Initial encounter. EXAM: RIGHT FOOT COMPLETE - 3+ VIEW COMPARISON:  None. FINDINGS: There is no evidence of fracture or dislocation. The joint spaces are preserved. There is no evidence of talar subluxation; the subtalar joint is unremarkable in appearance. An os peroneum is noted. There is question of vague lucency at the medial aspects of the fourth and fifth metatarsal heads. Mild osteomyelitis cannot be excluded. Diffuse soft tissue edema is noted about the forefoot and dorsum of the midfoot. Scattered soft tissue air is noted tracking about the lateral aspect of the forefoot. Would correlate clinically to exclude necrotizing fasciitis. IMPRESSION: 1. No evidence of fracture or dislocation. 2. Os peroneum noted. 3. Question of vague  lucency at the medial aspects of the fourth and fifth metatarsal heads. Mild osteomyelitis cannot be excluded. 4. Scattered soft tissue air tracking about the lateral aspect of the forefoot. Would correlate clinically to exclude necrotizing fasciitis These results were called by telephone at the time of interpretation on 02/27/2017 at 7:22 pm to Dr. Shirlyn Goltz, who verbally acknowledged these results. Electronically Signed   By: Garald Balding M.D.   On: 02/27/2017 19:24    Assessment & Plan: Patient has a significant infection to the dorsum of the right foot with abscess and cellulitis present in the region as well. Frank pus is emanating from the wound with compression of the tissues. His white count has come back down to normal from yesterday. His all compounded by his severe lymphedema. X-rays are negative for any type of bone involvement but there is evidence of perhaps some gas in the tissues. I was able to open up some of that tissue and allow more drainage from the region. I packed some gauze into this area for tonight but have gotten scheduled for early in the morning to incise and drain this area and get everything cleaned up across the region. I explained that to him once involved with this he understands and accepts risk for complications realizing that if he does not get this cleaned up these at severe threat for limb loss. Hedy Camara is on Zosyn and vancomycin and is on dialysis for chronic kidney disease.  Principal Problem:   Cellulitis in diabetic foot ([place]) Active Problems:   Acute on chronic renal failure ([place])   Cellulitis     Family Communication: Plan discussed with patient   Perry Mount M.D on 02/28/2017 at 3:43 PM  Thank you for the consult, we will follow the patient with you in the Hospital.

## 2017-02-28 NOTE — Progress Notes (Signed)
[facility]  ROUNDING NOTE   Subjective:   [name] admitted to [facility] on 02/27/2017 for Hyperkalemia [E87.5] Abscess or cellulitis of foot [L03.119, L02.619] Renal failure, unspecified chronicity [N19]   Patient was seen in the clinic by my partner, Dr. Holley Raring yesterday, where he was found to have uremic symptoms, volume overload, concern for right lower extremity osteomyelitis and left olecranon fracture.   He is laying in bed. Subjective fevers and chills. Friend at bedside.   Objective:  Vital signs in last 24 hours:  Temp:  [97.3 F (36.3 C)-99 F (37.2 C)] 97.3 F (36.3 C) (05/24 0528) Pulse Rate:  [99-111] 100 (05/24 0812) Resp:  [14-20] 20 (05/24 0528) BP: (137-184)/(70-85) 137/72 (05/24 0812) SpO2:  [93 %-100 %] 97 % (05/24 0528) Weight:  [156.1 kg (344 lb 3.2 oz)-158.9 kg (350 lb 4.8 oz)] 156.1 kg (344 lb 3.2 oz) (05/23 2106)  Weight change:  Filed Weights   02/27/17 1715 02/27/17 2106  Weight: (!) 158.9 kg (350 lb 4.8 oz) (!) 156.1 kg (344 lb 3.2 oz)    Intake/Output: I/O last 3 completed shifts: In: 240 [P.O.:240] Out: -    Intake/Output this shift:  Total I/O In: 240 [P.O.:240] Out: -   Physical Exam: General: NAD, laying in bed  Head: Normocephalic, atraumatic. Moist oral mucosal membranes  Eyes: Anicteric, PERRL  Neck: Supple, trachea midline  Lungs:  Clear to auscultation  Heart: Regular rate and rhythm  Abdomen:  Soft, nontender, obese  Extremities: ++ peripheral edema.  Neurologic: Nonfocal, moving all four extremities  Skin: No lesions  Access: none    Basic Metabolic Panel:  Recent Labs Lab 02/27/17 1718 02/28/17 0443  NA 134* 135  K 5.3* 4.8  CL 99* 101  CO2 22 24  GLUCOSE 251* 291*  BUN 72* 71*  CREATININE 8.24* 8.03*  CALCIUM 8.6* 8.1*    Liver Function Tests:  Recent Labs Lab 02/27/17 1718  AST 17  ALT 17  ALKPHOS 129*  BILITOT 0.3  PROT 7.6  ALBUMIN 2.6*   No results for input(s):  LIPASE, AMYLASE in the last 168 hours. No results for input(s): AMMONIA in the last 168 hours.  CBC:  Recent Labs Lab 02/27/17 1718 02/28/17 0443  WBC 11.1* 6.7  NEUTROABS 9.2*  --   HGB 9.4* 9.4*  HCT 28.1* 27.7*  MCV 86.1 86.5  PLT 337 332    Cardiac Enzymes: No results for input(s): CKTOTAL, CKMB, CKMBINDEX, TROPONINI in the last 168 hours.  BNP: Invalid input(s): POCBNP  CBG:  Recent Labs Lab 02/27/17 1723 02/27/17 2243 02/28/17 0145 02/28/17 0531 02/28/17 0749  GLUCAP 229* 310* 332* 57* 250*    Microbiology: Results for orders placed or performed during the hospital encounter of 02/27/17  Blood culture (routine x 2)     Status: None (Preliminary result)   Collection Time: 02/27/17  7:09 PM  Result Value Ref Range Status   Specimen Description BLOOD RIGHT HAND  Final   Special Requests Blood Culture adequate volume  Final   Culture NO GROWTH < 12 HOURS  Final   Report Status PENDING  Incomplete  Blood culture (routine x 2)     Status: None (Preliminary result)   Collection Time: 02/27/17  7:09 PM  Result Value Ref Range Status   Specimen Description BLOOD RIGHT AC  Final   Special Requests Blood Culture adequate volume  Final   Culture NO GROWTH < 12 HOURS  Final   Report Status PENDING  Incomplete    Coagulation Studies: No results for input(s): LABPROT, INR in the last 72 hours.  Urinalysis:  Recent Labs  02/27/17 1718  COLORURINE YELLOW*  LABSPEC 1.012  PHURINE 5.0  GLUCOSEU >=500*  HGBUR SMALL*  BILIRUBINUR NEGATIVE  KETONESUR NEGATIVE  PROTEINUR >=300*  NITRITE NEGATIVE  LEUKOCYTESUR NEGATIVE      Imaging: Dg Chest 2 View  Result Date: 02/27/2017 CLINICAL DATA:  56 y/o  M; weakness. EXAM: CHEST  2 VIEW COMPARISON:  None. FINDINGS: The heart size and mediastinal contours are within normal limits. Both lungs are clear. The visualized skeletal structures are unremarkable. IMPRESSION: No active cardiopulmonary disease. Electronically  Signed   By: Kristine Garbe M.D.   On: 02/27/2017 19:21   Dg Elbow Complete Left  Result Date: 02/27/2017 CLINICAL DATA:  Left elbow pain.  Known left elbow fracture. EXAM: LEFT ELBOW - COMPLETE 3+ VIEW COMPARISON:  01/18/2017 and 02/05/2017 FINDINGS: Left elbow is in a fiberglass cast or splint. There appears to be increased impaction of the comminuted radial head fracture. Alignment of the elbow is abnormal with posterior subluxation of the elbow. Again noted is a displaced fracture involving the distal humerus lateral epicondyle. There also appears to be a fracture along the medial aspect of the olecranon or possibly the coronoid process. IMPRESSION: Abnormal alignment of the left elbow with posterior subluxation. Increased displacement and impaction of the radial head fracture. There appears to be additional fractures involving the distal humeral lateral epicondyle and probably the olecranon or coronoid process. Electronically Signed   By: Markus Daft M.D.   On: 02/27/2017 19:31   Dg Foot Complete Right  Result Date: 02/27/2017 CLINICAL DATA:  Wound at the right third through fifth metatarsals, with severe edema. Initial encounter. EXAM: RIGHT FOOT COMPLETE - 3+ VIEW COMPARISON:  None. FINDINGS: There is no evidence of fracture or dislocation. The joint spaces are preserved. There is no evidence of talar subluxation; the subtalar joint is unremarkable in appearance. An os peroneum is noted. There is question of vague lucency at the medial aspects of the fourth and fifth metatarsal heads. Mild osteomyelitis cannot be excluded. Diffuse soft tissue edema is noted about the forefoot and dorsum of the midfoot. Scattered soft tissue air is noted tracking about the lateral aspect of the forefoot. Would correlate clinically to exclude necrotizing fasciitis. IMPRESSION: 1. No evidence of fracture or dislocation. 2. Os peroneum noted. 3. Question of vague lucency at the medial aspects of the fourth and  fifth metatarsal heads. Mild osteomyelitis cannot be excluded. 4. Scattered soft tissue air tracking about the lateral aspect of the forefoot. Would correlate clinically to exclude necrotizing fasciitis These results were called by telephone at the time of interpretation on 02/27/2017 at 7:22 pm to Dr. Shirlyn Goltz, who verbally acknowledged these results. Electronically Signed   By: Garald Balding M.D.   On: 02/27/2017 19:24     Medications:   . piperacillin-tazobactam (ZOSYN)  IV 3.375 g (02/28/17 1039)  . [START ON 03/01/2017] vancomycin     . amLODipine  10 mg Oral Daily  . aspirin EC  81 mg Oral Daily  . heparin  5,000 Units Subcutaneous Q8H  . hydrALAZINE  25 mg Oral Q8H  . insulin aspart  0-9 Units Subcutaneous TID WC & HS  . insulin glargine  10 Units Subcutaneous Daily   docusate sodium, oxyCODONE-acetaminophen, [START ON 03/01/2017] vancomycin  Assessment/ Plan:  [name] is a 56 y.o. white male  with hypertension,  diabetes mellitus type 2 insulin dependent, hyperlipidemia, GERD, and morbid obesity  1. 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 No biopsy.  Hyperkalemia on admission Will need to start hemodialysis. Patient made aware. Discussed home dialysis options. Currently patient is not a candidate for home dialysis due to left arm fracture and documented noncompliance.  Consult vascular surgery for temp HD access. Tunneled access after infection clears.  - Check PPD, hepatis panel  2. Hypertension: blood pressure at goal. Currently holding lisinopril.   3. Anemia of chronic kidney disease: hemoglobin 9.4. Normocytic - Check iron studies - Check folate, vitamin B12 - Discussed ESA therapy.   4. Secondary Hyperparathyroidism: calcium at goal. Not currently on vitamin D agent or phosphorus binders.  - Check PTH, phosphorus  5. Diabetes mellitus type II with chronic kidney disease: with  diabetic foot ulcer right foot  Blood cultures pending - Continue glucose control.  - empiric antibiotic zosyn and vanco - Appreciate podiatry input.     LOS: 1 Emmalynne Courtney 5/24/201811:43 AM

## 2017-02-28 NOTE — Op Note (Signed)
  OPERATIVE NOTE   PROCEDURE: 1. Ultrasound guidance for vascular access right femoral vein 2. Placement of a 30 cm triple lumen dialysis catheter right femoral vein  PRE-OPERATIVE DIAGNOSIS: 1. Acute on chronic renal failure now requiring dialysis  POST-OPERATIVE DIAGNOSIS: Same  SURGEON: Leotis Pain, MD  ASSISTANT(S): None  ANESTHESIA: local  ESTIMATED BLOOD LOSS: Minimal   FINDING(S): 1. None  SPECIMEN(S): None  INDICATIONS:  Patient is a 56 y.o.male who presents with progression of renal failure now requiring dialysis.  Risks and benefits were discussed, and informed consent was obtained..  DESCRIPTION: After obtaining full informed written consent, the patient was laid flat in the bed. The right groin was sterilely prepped and draped in a sterile surgical field was created. The right femoral vein was visualized with ultrasound and found to be widely patent. It was then accessed under direct guidance without difficulty with a Seldinger needle and a permanent image was recorded. A J-wire was then placed. After skin nick and dilatation, a 30 cm triple lumen dialysis catheter was placed over the wire and the wire was removed. The lumens withdrew dark red nonpulsatile blood and flushed easily with sterile saline. The catheter was secured to the skin with 3 nylon sutures. Sterile dressing was placed.  COMPLICATIONS: None  CONDITION: Stable  Leotis Pain 02/28/2017 5:50 PM  This note was created with Dragon Medical transcription system. Any errors in dictation are purely unintentional.

## 2017-02-28 NOTE — Progress Notes (Addendum)
Steven [name] at [place] NAME: Steven [name]    MR#:  [id number]  DATE OF BIRTH:  07/28/1961  SUBJECTIVE:   Patient came in with foul smelling ulcer and drainage with the right foot. REVIEW OF SYSTEMS:   Review of Systems  Constitutional: Negative for chills, fever and weight loss.  HENT: Negative for ear discharge, ear pain and nosebleeds.   Eyes: Negative for blurred vision, pain and discharge.  Respiratory: Negative for sputum production, shortness of breath, wheezing and stridor.   Cardiovascular: Positive for leg swelling. Negative for chest pain, palpitations, orthopnea and PND.  Gastrointestinal: Negative for abdominal pain, diarrhea, nausea and vomiting.  Genitourinary: Negative for frequency and urgency.  Musculoskeletal: Negative for back pain and joint pain.  Neurological: Positive for weakness. Negative for sensory change, speech change and focal weakness.  Psychiatric/Behavioral: Negative for depression and hallucinations. The patient is not nervous/anxious.    Tolerating Diet:yes Tolerating PT: pending  DRUG ALLERGIES:   Allergies  Allergen Reactions  . Codeine Nausea And Vomiting    VITALS:  Blood pressure 137/72, pulse 100, temperature 97.3 F (36.3 C), temperature source Oral, resp. rate 20, height 5\' 10"  (1.778 m), weight (!) 156.1 kg (344 lb 3.2 oz), SpO2 97 %.  PHYSICAL EXAMINATION:   Physical Exam  GENERAL:  56 y.o.-year-old patient lying in the bed with no acute distress. obese EYES: Pupils equal, round, reactive to light and accommodation. No scleral icterus. Extraocular muscles intact.  HEENT: Head atraumatic, normocephalic. Oropharynx and nasopharynx clear.  NECK:  Supple, no jugular venous distention. No thyroid enlargement, no tenderness.  LUNGS: Normal breath sounds bilaterally, no wheezing, rales, rhonchi. No use of accessory muscles of respiration.  CARDIOVASCULAR: S1, S2 normal. No murmurs,  rubs, or gallops.  ABDOMEN: Soft, nontender, nondistended. Bowel sounds present. No organomegaly or mass.  EXTREMITIES: Large foul-smelling ulcer on the dorsal aspect of the right foot with significant edema and cellulitis NEUROLOGIC: Cranial nerves II through XII are intact. No focal Motor or sensory deficits b/l.   PSYCHIATRIC:  patient is alert and oriented x 3.  SKIN: No obvious rash, lesion, or ulcer.   LABORATORY PANEL:  CBC  Recent Labs Lab 02/28/17 0443  WBC 6.7  HGB 9.4*  HCT 27.7*  PLT 332    Chemistries   Recent Labs Lab 02/27/17 1718 02/28/17 0443  NA 134* 135  K 5.3* 4.8  CL 99* 101  CO2 22 24  GLUCOSE 251* 291*  BUN 72* 71*  CREATININE 8.24* 8.03*  CALCIUM 8.6* 8.1*  AST 17  --   ALT 17  --   ALKPHOS 129*  --   BILITOT 0.3  --    Cardiac Enzymes No results for input(s): TROPONINI in the last 168 hours. RADIOLOGY:  Dg Chest 2 View  Result Date: 02/27/2017 CLINICAL DATA:  56 y/o  M; weakness. EXAM: CHEST  2 VIEW COMPARISON:  None. FINDINGS: The heart size and mediastinal contours are within normal limits. Both lungs are clear. The visualized skeletal structures are unremarkable. IMPRESSION: No active cardiopulmonary disease. Electronically Signed   By: Kristine Garbe M.D.   On: 02/27/2017 19:21   Dg Elbow Complete Left  Result Date: 02/27/2017 CLINICAL DATA:  Left elbow pain.  Known left elbow fracture. EXAM: LEFT ELBOW - COMPLETE 3+ VIEW COMPARISON:  01/18/2017 and 02/05/2017 FINDINGS: Left elbow is in a fiberglass cast or splint. There appears to be increased impaction of the comminuted radial head fracture. Alignment  of the elbow is abnormal with posterior subluxation of the elbow. Again noted is a displaced fracture involving the distal humerus lateral epicondyle. There also appears to be a fracture along the medial aspect of the olecranon or possibly the coronoid process. IMPRESSION: Abnormal alignment of the left elbow with posterior  subluxation. Increased displacement and impaction of the radial head fracture. There appears to be additional fractures involving the distal humeral lateral epicondyle and probably the olecranon or coronoid process. Electronically Signed   By: Markus Daft M.D.   On: 02/27/2017 19:31   Dg Foot Complete Right  Result Date: 02/27/2017 CLINICAL DATA:  Wound at the right third through fifth metatarsals, with severe edema. Initial encounter. EXAM: RIGHT FOOT COMPLETE - 3+ VIEW COMPARISON:  None. FINDINGS: There is no evidence of fracture or dislocation. The joint spaces are preserved. There is no evidence of talar subluxation; the subtalar joint is unremarkable in appearance. An os peroneum is noted. There is question of vague lucency at the medial aspects of the fourth and fifth metatarsal heads. Mild osteomyelitis cannot be excluded. Diffuse soft tissue edema is noted about the forefoot and dorsum of the midfoot. Scattered soft tissue air is noted tracking about the lateral aspect of the forefoot. Would correlate clinically to exclude necrotizing fasciitis. IMPRESSION: 1. No evidence of fracture or dislocation. 2. Os peroneum noted. 3. Question of vague lucency at the medial aspects of the fourth and fifth metatarsal heads. Mild osteomyelitis cannot be excluded. 4. Scattered soft tissue air tracking about the lateral aspect of the forefoot. Would correlate clinically to exclude necrotizing fasciitis These results were called by telephone at the time of interpretation on 02/27/2017 at 7:22 pm to Dr. Shirlyn [name], who verbally acknowledged these results. Electronically Signed   By: Garald Balding M.D.   On: 02/27/2017 19:24   ASSESSMENT AND PLAN:   Steven [name]  is a 56 y.o. male with a known history of Chronic kidney disease, diabetes, high cholesterol, hypertension- had a fall 6 weeks ago and broke his elbow, went to his primary care doctor's office, x-rays showed Comminuted fracture and was advised to go to a  tertiary care center and special. Steven [name].  * Cellulitis  With foul smelling ulcers and severe edema on right foot, possible osteomyelitis   IV vancomycin and Zosyn   Podiatry consulted  * Acute on chronic renal failure, CKD stage V   Nephrology consulted. Pt to get HD.  hold nephrotoxic medication. Temporary cath to be placed today by Dr Lucky Cowboy  * Hypertension Amlodipine and hydralazine  * Diabetes--poorly uncontrolled   Insulin sliding scale coverage and Lantus. Seen by Diabetes coordinator Pt has very poorly controlled DM  * Anemia   Likely due to chronic kidney disease.  Case discussed with Care Management/Social Worker. Management plans discussed with the patient, family and they are in agreement.  CODE STATUS: FULL  DVT Prophylaxis: Heparin  TOTAL TIME TAKING CARE OF THIS PATIENT: *30* minutes.  >50% time spent on counselling and coordination of care  POSSIBLE D/C IN 3-4DAYS, DEPENDING ON CLINICAL CONDITION.  Note: This dictation was prepared with Dragon dictation along with smaller phrase technology. Any transcriptional errors that result from this process are unintentional.  Karl Erway M.D on 02/28/2017 at 1:02 PM  Between 7am to 6pm - Pager - [phone]  After 6pm go to [email or web address] - password [email or web address] [facility]  Office  [phone]  CC: Primary care physician; [name], MD

## 2017-02-28 NOTE — Progress Notes (Signed)
PPD placed in L arm.

## 2017-02-28 NOTE — Care Management (Signed)
Patient self Pay.  [facility].  [facility] Lia[facility] is aware. Spoke with Amy From Financial counseling office who has met with patient, and will follow up with patient again regarding applying for Medicaid.

## 2017-02-28 NOTE — Consult Note (Signed)
[facility] Consult Note  MRN : [id number]  Steven [name] is a 56 y.o. ([birth year]) male who presents with chief complaint of  Chief Complaint  Patient presents with  . Wound Check  . Leg Swelling  . Elbow Injury  .  History of Present Illness: Patient is admitted to the hospital with leg swelling and multiple injuries and has developed acute renal failure.  The patient reports weakness and lethargy.  He has swelling and shortness of breath with minimal activity.  He denies fever or chills..  The patient is chronically ill and had known CKD prior to this worsening renal failure. The nephrology service has decided to initiate dialysis at this time, and we are asked to place a temporary dialysis catheter for immediate dialysis use.    Current Facility-Administered Medications  Medication Dose Route Frequency Provider Last Rate Last Dose  . amLODipine (NORVASC) tablet 10 mg  10 mg Oral Daily Vaughan Basta, MD   10 mg at 02/28/17 0840  . aspirin EC tablet 81 mg  81 mg Oral Daily Vaughan Basta, MD   81 mg at 02/28/17 0840  . docusate sodium (COLACE) capsule 100 mg  100 mg Oral BID PRN Vaughan Basta, MD      . heparin injection 5,000 Units  5,000 Units Subcutaneous Q8H Vaughan Basta, MD   5,000 Units at 02/28/17 0932  . hydrALAZINE (APRESOLINE) tablet 25 mg  25 mg Oral Q8H Vaughan Basta, MD   25 mg at 02/28/17 6712  . insulin aspart (novoLOG) injection 0-9 Units  0-9 Units Subcutaneous TID WC & HS Vaughan Basta, MD   5 Units at 02/28/17 1226  . [START ON 03/01/2017] insulin glargine (LANTUS) injection 30 Units  30 Units Subcutaneous Daily Fritzi Mandes, MD      . oxyCODONE-acetaminophen (PERCOCET/ROXICET) 5-325 MG per tablet 2 tablet  2 tablet Oral Q6H PRN Vaughan Basta, MD      . piperacillin-tazobactam (ZOSYN) IVPB 3.375 g  3.375 g Intravenous Q12H Loree Fee, RPH 12.5 mL/hr at 02/28/17 1039  3.375 g at 02/28/17 1039  . vancomycin (VANCOCIN) IVPB 1000 mg/200 mL premix  1,000 mg Intravenous Q dialysis Hallaji, Dani Gobble, [facility]        Past Medical History:  Diagnosis Date  . Chronic kidney disease   . Diabetes mellitus without complication ([place])   . High cholesterol   . High triglycerides   . Hypertension     Past Surgical History:  Procedure Laterality Date  . APPENDECTOMY      Social History Social History  Substance Use Topics  . Smoking status: Never Smoker  . Smokeless tobacco: Never Used  . Alcohol use Yes     Comment: occ  No IVDU  Family History Family History  Problem Relation Age of Onset  . CAD Father   No bleeding disorders, clotting disorders, or aneurysms  Allergies  Allergen Reactions  . Codeine Nausea And Vomiting     REVIEW OF SYSTEMS (Negative unless checked)  Constitutional: [] Weight loss  [] Fever  [] Chills Cardiac: [] Chest pain   [] Chest pressure   [] Palpitations   [] Shortness of breath when laying flat   [] Shortness of breath at rest   [] Shortness of breath with exertion. Vascular:  [] Pain in legs with walking   [] Pain in legs at rest   [] Pain in legs when laying flat   [] Claudication   [] Pain in feet when walking  [] Pain in feet at rest  [] Pain in feet when  laying flat   [] History of DVT   [] Phlebitis   [x] Swelling in legs   [] Varicose veins   [] Non-healing ulcers Pulmonary:   [] Uses home oxygen   [] Productive cough   [] Hemoptysis   [] Wheeze  [] COPD   [] Asthma Neurologic:  [] Dizziness  [] Blackouts   [] Seizures   [] History of stroke   [] History of TIA  [] Aphasia   [] Temporary blindness   [] Dysphagia   [] Weakness or numbness in arms   [] Weakness or numbness in legs Musculoskeletal:  [] Arthritis   [x] Joint swelling   [x] Joint pain   [] Low back pain Hematologic:  [] Easy bruising  [] Easy bleeding   [] Hypercoagulable state   [x] Anemic  [] Hepatitis Gastrointestinal:  [] Blood in stool   [] Vomiting blood  [] Gastroesophageal reflux/heartburn    [] Difficulty swallowing. Genitourinary:  [x] Chronic kidney disease   [] Difficult urination  [] Frequent urination  [] Burning with urination   [] Blood in urine Skin:  [] Rashes   [x] Ulcers   [] Wounds Psychological:  [] History of anxiety   []  History of major depression.      Physical Examination  Vitals:   02/27/17 2023 02/27/17 2106 02/28/17 0528 02/28/17 0812  BP: (!) 141/84 (!) 164/71 140/85 137/72  Pulse: 99 (!) 106 (!) 105 100  Resp: 16 20 20    Temp:  98.2 F (36.8 C) 97.3 F (36.3 C)   TempSrc:  Oral Oral   SpO2: 93% 100% 97%   Weight:  (!) 156.1 kg (344 lb 3.2 oz)    Height:  5\' 10"  (1.778 m)     Body mass index is 49.39 kg/m. Gen: WD/WN Head: [place]/AT, No temporalis wasting Ear/Nose/Throat: Hearing grossly intact, nares w/o erythema or drainage Eyes: Sclera non-icteric, conjunctiva clear Neck: Supple, no nuchal rigidity.  No JVD.  Pulmonary:  Good air movement, clear to auscultation bilaterally.  Cardiac: RRR, normal S1, S2, no Murmurs, rubs or gallops. Vascular: 2+ radial pulses bilaterally Gastrointestinal: soft, non-tender/non-distended. No guarding/reflex.  Musculoskeletal: M/S 5/5 throughout.  Extremities without ischemic changes.  No deformity or atrophy. 2+ Edema in the lower extremities bilaterally Neurologic: awake and oriented.  Moves all four extremities Psychiatric: Normal affect and mood Dermatologic: No rashes or ulcers noted.         CBC Lab Results  Component Value Date   WBC 6.7 02/28/2017   HGB 9.4 (L) 02/28/2017   HCT 27.7 (L) 02/28/2017   MCV 86.5 02/28/2017   PLT 332 02/28/2017    BMET    Component Value Date/Time   NA 135 02/28/2017 0443   K 4.8 02/28/2017 0443   CL 101 02/28/2017 0443   CO2 24 02/28/2017 0443   GLUCOSE 291 (H) 02/28/2017 0443   BUN 71 (H) 02/28/2017 0443   CREATININE 8.03 (H) 02/28/2017 0443   CREATININE 1.41 (H) 12/07/2011 1030   CALCIUM 8.1 (L) 02/28/2017 0443   GFRNONAA 7 (L) 02/28/2017 0443   GFRAA 8 (L)  02/28/2017 0443   Estimated Creatinine Clearance: 15.6 mL/min (A) (by C-G formula based on SCr of 8.03 mg/dL (H)).  COAG No results found for: INR, PROTIME  Radiology Dg Chest 2 View  Result Date: 02/27/2017 CLINICAL DATA:  56 y/o  M; weakness. EXAM: CHEST  2 VIEW COMPARISON:  None. FINDINGS: The heart size and mediastinal contours are within normal limits. Both lungs are clear. The visualized skeletal structures are unremarkable. IMPRESSION: No active cardiopulmonary disease. Electronically Signed   By: Kristine Garbe M.D.   On: 02/27/2017 19:21   Dg Elbow 2 Views Left  Result Date:  02/04/2017 Radiology reading dictated by Dr. 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  Known left elbow fracture. EXAM: LEFT ELBOW - COMPLETE 3+ VIEW COMPARISON:  01/18/2017 and 02/05/2017 FINDINGS: Left elbow is in a fiberglass cast or splint. There appears to be increased impaction of the comminuted radial head fracture. Alignment of the elbow is abnormal with posterior subluxation of the elbow. Again noted is a displaced fracture involving the distal humerus lateral epicondyle. There also appears to be a fracture along the medial aspect of the olecranon or possibly the coronoid process. IMPRESSION: Abnormal alignment of the left elbow with posterior subluxation. Increased displacement and impaction of the radial head fracture. There appears to be additional fractures involving the distal humeral lateral epicondyle and probably the  olecranon or coronoid process. Electronically Signed   By: Markus Daft M.D.   On: 02/27/2017 19:31   Dg Elbow Complete Left  Result Date: 02/05/2017 CLINICAL DATA:  Left elbow pain after new injury to the left elbow. Known radial head fracture sustained 2 weeks ago. EXAM: LEFT ELBOW - COMPLETE 3+ VIEW COMPARISON:  Radiographs from 02/01/2017 and 01/18/2017 FINDINGS: A posterior plaster splint obscures fine bony detail. There is a slightly depressed radial head fracture with similar appearance to the study from 02/01/2017 but with further displacement of a small fracture fragment along the volar aspect of the elbow joint. No new fracture lucencies are seen. No dislocation of the elbow joint. Small joint effusion with anterior posterior fat pad signs. IMPRESSION: Comminuted appearing fracture involving the radial head with further volar displacement of a small fracture fragment best seen on the lateral view. Stable mild depression of the radial head is noted since 02/01/2017 comparison. Small joint effusion. Electronically Signed   By: Ashley Royalty M.D.   On: 02/05/2017 19:50   Dg Foot Complete Right  Result Date: 02/27/2017 CLINICAL DATA:  Wound at the right third through fifth metatarsals, with severe edema. Initial encounter. EXAM: RIGHT FOOT COMPLETE - 3+ VIEW COMPARISON:  None. FINDINGS: There is no evidence of fracture or dislocation. The joint spaces are preserved. There is no evidence of talar subluxation; the subtalar joint is unremarkable in appearance. An os peroneum is noted. There is question of vague lucency at the medial aspects of the fourth and fifth metatarsal heads. Mild osteomyelitis cannot be excluded. Diffuse soft tissue edema is noted about the forefoot and dorsum of the midfoot. Scattered soft tissue air is noted tracking about the lateral aspect of the forefoot. Would correlate clinically to exclude necrotizing fasciitis. IMPRESSION: 1. No evidence of fracture or dislocation. 2. Os  peroneum noted. 3. Question of vague lucency at the medial aspects of the fourth and fifth metatarsal heads. Mild osteomyelitis cannot be excluded. 4. Scattered soft tissue air tracking about the lateral aspect of the forefoot. Would correlate clinically to exclude necrotizing fasciitis These results were called by telephone at the time of interpretation on 02/27/2017 at 7:22 pm to Dr. Shirlyn Goltz, who verbally acknowledged these results. Electronically Signed   By: Francoise Schaumann.D.  On: 02/27/2017 19:24      Assessment/Plan 1. Renal failure 2. DM.  Likely an underlying cause of renal failure. blood glucose control important in reducing the progression of atherosclerotic disease. Also, involved in wound healing. On appropriate medications. 3. HTN. Likely an underlying cause of renal failure and blood pressure control important in reducing the progression of atherosclerotic disease. On appropriate oral medications.    We will proceed with temporary dialysis catheter placement at this time.  Risks and benefits discussed with patient and/or family, and the catheter will be placed to allow immediate initiation of dialysis.  If the patient's renal function does not improve throughout the hospital course, we will be happy to place a tunneled dialysis catheter for long term use prior to discharge.     Leotis Pain, MD  02/28/2017 1:22 PM

## 2017-02-28 NOTE — Progress Notes (Signed)
OT Cancellation Note  Patient Details Name: WESTLEY BLASS MRN: [id number] DOB: [birth year]   Cancelled Treatment:    Reason Eval/Treat Not Completed: Patient at procedure or test/ unavailable. Order received. Chart reviewed. Pt out of room for testing or dialysis. Will re-attempt OT evaluation next date.   Jeni Salles, MPH, MS, OTR/L ascom [phone] 02/28/17, 4:06 PM

## 2017-02-28 NOTE — Consult Note (Signed)
ORTHOPAEDIC CONSULTATION  REQUESTING PHYSICIAN: Fritzi Mandes, MD  Chief Complaint: Chronic left elbow fracture  HPI: Steven [name] is a 56 y.o. right-hand dominant male with chronic kidney disease who is admitted to the hospitalist service for worsening kidney function and possible osteomyelitis of his feet.  Orthopedics was consulted due to the patient having a splint on his left elbow for a fracture he sustained at the end of April 2018.  Patient explains that he initially saw an orthopedic surgeon in [place] who diagnosed him with a fracture of the elbow and referred him to [facility] orthopedics to see an elbow specialist.  Patient states he was denied an appointment at [facility] because he had no health insurance.  Patient has been in a posterior splint since the date of injury. There are x-ray films from [facility] and [place] which demonstrate displacement of the radial head fracture over time leading to subluxation of the elbow joint.  Past Medical History:  Diagnosis Date  . Chronic kidney disease   . Diabetes mellitus without complication ([place])   . High cholesterol   . High triglycerides   . Hypertension    Past Surgical History:  Procedure Laterality Date  . APPENDECTOMY     Social History   Social History  . Marital status: Divorced    Spouse name: N/A  . Number of children: N/A  . Years of education: N/A   Social History Main Topics  . Smoking status: Never Smoker  . Smokeless tobacco: Never Used  . Alcohol use Yes     Comment: occ  . Drug use: Unknown  . Sexual activity: Not Asked   Other Topics Concern  . None   Social History Narrative  . None   Family History  Problem Relation Age of Onset  . CAD Father    Allergies  Allergen Reactions  . Codeine Nausea And Vomiting   Prior to Admission medications   Medication Sig Start Date End Date Taking? Authorizing Provider  amLODipine (NORVASC) 10 MG tablet TAKE ONE TABLET  BY MOUTH ONCE DAILY --  **NEEDS  VISIT  WITH  PRIMARY  CARE  PROVIDER  BEFORE  REFILLS  GIVEN** 12/01/14  Yes Alveda Reasons, MD  aspirin EC 81 MG tablet TAKE ONE  BY MOUTH EVERY DAY 09/15/12  Yes Alveda Reasons, MD  furosemide (LASIX) 20 MG tablet TAKE TWO TABLETS BY MOUTH TWICE DAILY AS NEEDED FOR EDEMA Patient taking differently: No sig reported 12/03/14  Yes Alveda Reasons, MD  HYDRALAZINE HCL PO Take 50 mg by mouth 3 (three) times daily.    Yes [provider]  HYDROcodone-acetaminophen (NORCO/VICODIN) 5-325 MG tablet One tablet every four hours as needed for acute pain.  Limit of five days per [place] statue. 02/15/17  Yes Carole Civil, MD  insulin aspart protamine- aspart (NOVOLOG MIX 70/30) (70-30) 100 UNIT/ML injection Inject 40-150 Units into the skin 2 (two) times daily with a meal.   Yes [provider]  lisinopril (PRINIVIL,ZESTRIL) 40 MG tablet Take 40 mg by mouth daily.    Yes [provider]  glucose blood (ACCU-CHEK AVIVA) test strip Use as instructed.use to check blood sugar up to 3 times a day  Disp: 1 box 08/20/14   Alveda Reasons, MD  ibuprofen (ADVIL,MOTRIN) 800 MG tablet Take 1 tablet (800 mg total) by mouth every 8 (eight) hours as needed. 01/21/17   Carole Civil, MD  insulin lispro (HUMALOG) 100  UNIT/ML injection Inject 0.3 mLs (30 Units total) into the skin 3 (three) times daily before meals. Patient not taking: Reported on 02/27/2017 12/01/14   Alveda Reasons, MD  Insulin Pen Needle 31G X 8 MM MISC 1 Container by Does not apply route QID. qid--any brand 08/20/14   Alveda Reasons, MD  Insulin Syringes, Disposable, U-100 1 ML MISC Use as directed with insulin 08/20/14   Alveda Reasons, MD  Lancets (ACCU-CHEK MULTICLIX) lancets Use as instructed.  check blood sugar up to 3 times a day Disp:  1 box 08/20/14   Alveda Reasons, MD  lisinopril-hydrochlorothiazide (PRINZIDE,ZESTORETIC) 20-25 MG per tablet TAKE ONE TABLET BY  MOUTH ONCE DAILY Patient not taking: Reported on 02/27/2017 12/01/14   Alveda Reasons, MD  oxyCODONE-acetaminophen (PERCOCET/ROXICET) 5-325 MG tablet Take 2 tablets by mouth every 4 (four) hours as needed for severe pain. 02/05/17   Fransico Meadow, PA-C   Dg Chest 2 View  Result Date: 02/27/2017 CLINICAL DATA:  56 y/o  M; weakness. EXAM: CHEST  2 VIEW COMPARISON:  None. FINDINGS: The heart size and mediastinal contours are within normal limits. Both lungs are clear. The visualized skeletal structures are unremarkable. IMPRESSION: No active cardiopulmonary disease. Electronically Signed   By: Kristine Garbe M.D.   On: 02/27/2017 19:21   Dg Elbow Complete Left  Result Date: 02/27/2017 CLINICAL DATA:  Left elbow pain.  Known left elbow fracture. EXAM: LEFT ELBOW - COMPLETE 3+ VIEW COMPARISON:  01/18/2017 and 02/05/2017 FINDINGS: Left elbow is in a fiberglass cast or splint. There appears to be increased impaction of the comminuted radial head fracture. Alignment of the elbow is abnormal with posterior subluxation of the elbow. Again noted is a displaced fracture involving the distal humerus lateral epicondyle. There also appears to be a fracture along the medial aspect of the olecranon or possibly the coronoid process. IMPRESSION: Abnormal alignment of the left elbow with posterior subluxation. Increased displacement and impaction of the radial head fracture. There appears to be additional fractures involving the distal humeral lateral epicondyle and probably the olecranon or coronoid process. Electronically Signed   By: Markus Daft M.D.   On: 02/27/2017 19:31   Dg Foot Complete Right  Result Date: 02/27/2017 CLINICAL DATA:  Wound at the right third through fifth metatarsals, with severe edema. Initial encounter. EXAM: RIGHT FOOT COMPLETE - 3+ VIEW COMPARISON:  None. FINDINGS: There is no evidence of fracture or dislocation. The joint spaces are preserved. There is no evidence of talar  subluxation; the subtalar joint is unremarkable in appearance. An os peroneum is noted. There is question of vague lucency at the medial aspects of the fourth and fifth metatarsal heads. Mild osteomyelitis cannot be excluded. Diffuse soft tissue edema is noted about the forefoot and dorsum of the midfoot. Scattered soft tissue air is noted tracking about the lateral aspect of the forefoot. Would correlate clinically to exclude necrotizing fasciitis. IMPRESSION: 1. No evidence of fracture or dislocation. 2. Os peroneum noted. 3. Question of vague lucency at the medial aspects of the fourth and fifth metatarsal heads. Mild osteomyelitis cannot be excluded. 4. Scattered soft tissue air tracking about the lateral aspect of the forefoot. Would correlate clinically to exclude necrotizing fasciitis These results were called by telephone at the time of interpretation on 02/27/2017 at 7:22 pm to Dr. Shirlyn Goltz, who verbally acknowledged these results. Electronically Signed   By: Garald Balding M.D.   On: 02/27/2017 19:24    Positive ROS:  All other systems have been reviewed and were otherwise negative with the exception of those mentioned in the HPI and as above.  Physical Exam:  Patient is seen in his hospital room today.  He is alert, no acute distress  MUSCULOSKELETAL: Left elbow/upper extremity: Patient's splint was removed. Patient has intact sensation light touch in all 5 fingers left hand. He has a palpable radial pulse and his fingers are well-perfused. He did have edema in the left hand low his posterior splint which was attached to his arm with Ace wraps. Patient's skin overlying the left elbow is intact. He had minimal tenderness over the radial head today. He had supination to approximately 70-80 and pronation approximately 80. He could extend his elbow to approximately -30 and flexed to approximately 100-110.  Patient had no evidence of gross instability with elbow motion.  Assessment: Chronic left  elbow fracture  Plan: I reviewed the patient's most recent x-rays when he was admitted at [facility] last night. These films show displacement of the comminuted radial head fracture with posterior subluxation of the intact portion of the radial head in relation to the capitellum.  There is also subluxation of the ulnar humeral joint as well.     Patient is approximately 6 weeks out from his injury. He has instability of radiocapitellar joint with posterior subluxation. Patient was informed of these findings. He would ideally benefit from surgical treatment which, at this point, may require radial head replacement as the comminuted fracture fragments are likely encased in scar tissue 6 weeks after injury.  I do not perform this operation and there is not a Copy at [facility].   I recommend the patient consider seeing a hand or elbow surgeon at [facility]. Patient is at risk for significant stiffness of left elbow continues immobilization. I recommend he work on gentle elbow range of motion as I do not expect this will worsen fracture or subluxation. I'm going to order physical therapy/occupational therapy evaluation.   Patient will need to arrange follow-up with a hand or elbow specialist at [facility] once he is discharged. If the patient has significant pain during physical therapy/occupational therapy evaluation, he may need to return to using his posterior splint. Patient was able to move his left elbow relatively comfortably during my examination.    Thornton Park, MD    02/28/2017 11:25 AM

## 2017-02-28 NOTE — Progress Notes (Signed)
Inpatient Diabetes Program Recommendations  AACE/ADA: New Consensus Statement on Inpatient Glycemic Control (2015)  Target Ranges:  Prepandial:   less than 140 mg/dL      Peak postprandial:   less than 180 mg/dL (1-2 hours)      Critically ill patients:  140 - 180 mg/dL   Lab Results  Component Value Date   GLUCAP 250 (H) 02/28/2017   HGBA1C 8.2 05/12/2012    Review of Glycemic Control  Results for Steven [name], Steven [name] (MRN [id number]) as of 02/28/2017 11:01  Ref. Range 02/27/2017 17:23 02/27/2017 22:43 02/28/2017 01:45 02/28/2017 05:31 02/28/2017 07:49  Glucose-Capillary Latest Ref Range: 65 - 99 mg/dL 229 (H) 310 (H) 332 (H) 259 (H) 250 (H)    Diabetes history: A1C pending Outpatient Diabetes medications: Novolog mix 70/30 sliding scale 40-150 units bid- self adjusting confirmed with patient  Note from Dr. Netty Starring dates 06/05/17 state patient should be taking 80 units bid  Current orders for Inpatient glycemic control: Lantus 30 units qday, Novolog 0-9 units tid  Inpatient Diabetes Program Recommendations:  ** Note patient received 10 units Lantus  last night at 2309 and again today at 10 50am.   Spoke to patient regarding the insulin doses.  He in fact does take 40-150 units of 70/30 bid, pre-breakfast and pre-bedtime. I educated the patient on the need to take his evening 70/30 before supper- I also used a picture to show him how the insulin works.  He complained about often forgetting to take the insulin, that when he's at work, "my priority is work".  Consider changing the Lantus to Levemir 30 units bid and add Novolog 7 units tid (hold if he eats less than 50%). (this will help [place] determine an appropriate dose of basal /bolus for discharge)  Gentry Fitz, RN, [place], [place], CDE Diabetes Coordinator Inpatient Diabetes Program  [phone] (Team Pager) [phone] ([place]) 02/28/2017 12:49 PM

## 2017-03-01 ENCOUNTER — Encounter: Payer: Self-pay | Admitting: *Deleted

## 2017-03-01 ENCOUNTER — Inpatient Hospital Stay: Payer: Medicaid Other | Admitting: Certified Registered"

## 2017-03-01 ENCOUNTER — Encounter
Admission: EM | Disposition: A | Discharge status: Home Health | Payer: Self-pay | Source: Home / Self Care | Attending: Internal Medicine

## 2017-03-01 DIAGNOSIS — L03115 Cellulitis of right lower limb: Secondary | ICD-10-CM

## 2017-03-01 DIAGNOSIS — R600 Localized edema: Secondary | ICD-10-CM

## 2017-03-01 DIAGNOSIS — L02415 Cutaneous abscess of right lower limb: Secondary | ICD-10-CM

## 2017-03-01 HISTORY — DX: Cutaneous abscess of right lower limb: L03.115

## 2017-03-01 HISTORY — PX: IRRIGATION AND DEBRIDEMENT FOOT: SHX6602

## 2017-03-01 HISTORY — DX: Cutaneous abscess of right lower limb: L02.415

## 2017-03-01 HISTORY — DX: Localized edema: R60.0

## 2017-03-01 LAB — RENAL FUNCTION PANEL
ANION GAP: 8 (ref 5–15)
Albumin: 2.1 g/dL — ABNORMAL LOW (ref 3.5–5.0)
BUN: 44 mg/dL — AB (ref 6–20)
CHLORIDE: 102 mmol/L (ref 101–111)
CO2: 28 mmol/L (ref 22–32)
Calcium: 7.8 mg/dL — ABNORMAL LOW (ref 8.9–10.3)
Creatinine, Ser: 6.03 mg/dL — ABNORMAL HIGH (ref 0.61–1.24)
GFR calc Af Amer: 11 mL/min — ABNORMAL LOW (ref >60)
GFR calc non Af Amer: 9 mL/min — ABNORMAL LOW (ref >60)
GLUCOSE: 190 mg/dL — AB (ref 65–99)
PHOSPHORUS: 5.4 mg/dL — AB (ref 2.5–4.6)
Potassium: 4 mmol/L (ref 3.5–5.1)
Sodium: 138 mmol/L (ref 135–145)

## 2017-03-01 LAB — CBC
HEMATOCRIT: 25.2 % — AB (ref 40.0–52.0)
HEMOGLOBIN: 8.5 g/dL — AB (ref 13.0–18.0)
MCH: 29.3 pg (ref 26.0–34.0)
MCHC: 33.9 g/dL (ref 32.0–36.0)
MCV: 86.5 fL (ref 80.0–100.0)
Platelets: 302 10*3/uL (ref 150–440)
RBC: 2.92 MIL/uL — ABNORMAL LOW (ref 4.40–5.90)
RDW: 13.3 % (ref 11.5–14.5)
WBC: 6 10*3/uL (ref 3.8–10.6)

## 2017-03-01 LAB — PARATHYROID HORMONE, INTACT (NO CA): PTH: 121 pg/mL — AB (ref 15–65)

## 2017-03-01 LAB — GLUCOSE, CAPILLARY
Glucose-Capillary: 208 mg/dL — ABNORMAL HIGH (ref 65–99)
Glucose-Capillary: 206 mg/dL — ABNORMAL HIGH (ref 65–99)
Glucose-Capillary: 201 mg/dL — ABNORMAL HIGH (ref 65–99)
Glucose-Capillary: 196 mg/dL — ABNORMAL HIGH (ref 65–99)
Glucose-Capillary: 137 mg/dL — ABNORMAL HIGH (ref 65–99)
Glucose-Capillary: 236 mg/dL — ABNORMAL HIGH (ref 65–99)

## 2017-03-01 LAB — HEPATITIS B CORE ANTIBODY, IGM: Hep B C IgM: NEGATIVE

## 2017-03-01 LAB — HEPATITIS B CORE ANTIBODY, TOTAL: HEP B C TOTAL AB: NEGATIVE

## 2017-03-01 LAB — HIV ANTIBODY (ROUTINE TESTING W REFLEX): HIV Screen 4th Generation wRfx: NONREACTIVE

## 2017-03-01 LAB — HEPATITIS B SURFACE ANTIGEN: Hepatitis B Surface Ag: NEGATIVE

## 2017-03-01 LAB — HEMOGLOBIN A1C
Hgb A1c MFr Bld: 7.6 % — ABNORMAL HIGH (ref 4.8–5.6)
Hgb A1c MFr Bld: 8.2 % — ABNORMAL HIGH (ref 4.8–5.6)
MEAN PLASMA GLUCOSE: 171 mg/dL
Mean Plasma Glucose: 189 mg/dL

## 2017-03-01 LAB — POTASSIUM: Potassium: 4.6 mmol/L (ref 3.5–5.1)

## 2017-03-01 LAB — VITAMIN B12: Vitamin B-12: 346 pg/mL (ref 180–914)

## 2017-03-01 LAB — FOLATE: Folate: 14.5 ng/mL (ref >5.9)

## 2017-03-01 LAB — HEPATITIS B SURFACE ANTIBODY,QUALITATIVE: HEP B S AB: NONREACTIVE

## 2017-03-01 SURGERY — IRRIGATION AND DEBRIDEMENT FOOT
Anesthesia: General | Site: Foot | Laterality: Right

## 2017-03-01 MED ORDER — LIDOCAINE HCL (PF) 2 % IJ SOLN
INTRAMUSCULAR | Status: AC
  Filled 2017-03-01: qty 2

## 2017-03-01 MED ORDER — PROPOFOL 10 MG/ML IV BOLUS
INTRAVENOUS | Status: DC | PRN
  Administered 2017-03-01: 20 mg via INTRAVENOUS

## 2017-03-01 MED ORDER — CHLORHEXIDINE GLUCONATE CLOTH 2 % EX PADS
6.0000 | MEDICATED_PAD | Freq: Every day | CUTANEOUS | Status: AC
Start: 2017-03-01 — End: 2017-03-06
  Administered 2017-03-04: 6 via TOPICAL

## 2017-03-01 MED ORDER — LIVING WELL WITH DIABETES BOOK
Freq: Once | Status: DC
  Filled 2017-03-01: qty 1

## 2017-03-01 MED ORDER — BUPIVACAINE HCL (PF) 0.5 % IJ SOLN
INTRAMUSCULAR | Status: AC
  Filled 2017-03-01: qty 10

## 2017-03-01 MED ORDER — MUPIROCIN 2 % EX OINT
1.0000 "application " | TOPICAL_OINTMENT | Freq: Two times a day (BID) | CUTANEOUS | Status: DC
  Filled 2017-03-01: qty 22

## 2017-03-01 MED ORDER — ONDANSETRON HCL 4 MG/2ML IJ SOLN
INTRAMUSCULAR | Status: AC
  Filled 2017-03-01: qty 2

## 2017-03-01 MED ORDER — BUPIVACAINE HCL (PF) 0.5 % IJ SOLN
INTRAMUSCULAR | Status: AC
  Filled 2017-03-01: qty 30

## 2017-03-01 MED ORDER — ONDANSETRON HCL 4 MG/2ML IJ SOLN: 4.0000 mg | Freq: Once | INTRAMUSCULAR | Status: DC | PRN

## 2017-03-01 MED ORDER — PROPOFOL 10 MG/ML IV BOLUS
INTRAVENOUS | Status: AC
  Filled 2017-03-01: qty 20

## 2017-03-01 MED ORDER — MIDAZOLAM HCL 5 MG/5ML IJ SOLN
INTRAMUSCULAR | Status: DC | PRN
  Administered 2017-03-01 (×2): 1 mg via INTRAVENOUS

## 2017-03-01 MED ORDER — ONDANSETRON HCL 4 MG/2ML IJ SOLN
INTRAMUSCULAR | Status: DC | PRN
  Administered 2017-03-01: 4 mg via INTRAVENOUS

## 2017-03-01 MED ORDER — LIDOCAINE HCL (CARDIAC) 20 MG/ML IV SOLN
INTRAVENOUS | Status: DC | PRN
  Administered 2017-03-01: 20 mg via INTRAVENOUS

## 2017-03-01 MED ORDER — LIDOCAINE HCL (PF) 1 % IJ SOLN
INTRAMUSCULAR | Status: AC
  Filled 2017-03-01: qty 30

## 2017-03-01 MED ORDER — FENTANYL CITRATE (PF) 100 MCG/2ML IJ SOLN
INTRAMUSCULAR | Status: AC
  Filled 2017-03-01: qty 2

## 2017-03-01 MED ORDER — BUPIVACAINE HCL (PF) 0.5 % IJ SOLN
INTRAMUSCULAR | Status: DC | PRN
  Administered 2017-03-01: 2.5 mL

## 2017-03-01 MED ORDER — FENTANYL CITRATE (PF) 100 MCG/2ML IJ SOLN: 25.0000 ug | INTRAMUSCULAR | Status: DC | PRN

## 2017-03-01 MED ORDER — PROPOFOL 500 MG/50ML IV EMUL
INTRAVENOUS | Status: AC
  Filled 2017-03-01: qty 50

## 2017-03-01 MED ORDER — PROPOFOL 500 MG/50ML IV EMUL
INTRAVENOUS | Status: DC | PRN
  Administered 2017-03-01: 10 ug/kg/min via INTRAVENOUS

## 2017-03-01 MED ORDER — INSULIN ASPART 100 UNIT/ML ~~LOC~~ SOLN
0.0000 [IU] | Freq: Three times a day (TID) | SUBCUTANEOUS | Status: DC
  Administered 2017-03-01: 2 [IU] via SUBCUTANEOUS
  Administered 2017-03-01: 3 [IU] via SUBCUTANEOUS
  Administered 2017-03-02: 5 [IU] via SUBCUTANEOUS
  Administered 2017-03-02 – 2017-03-03 (×2): 3 [IU] via SUBCUTANEOUS
  Administered 2017-03-03: 2 [IU] via SUBCUTANEOUS
  Administered 2017-03-04: 5 [IU] via SUBCUTANEOUS
  Administered 2017-03-05: 2 [IU] via SUBCUTANEOUS
  Administered 2017-03-05: 3 [IU] via SUBCUTANEOUS
  Administered 2017-03-06: 5 [IU] via SUBCUTANEOUS
  Administered 2017-03-06: 3 [IU] via SUBCUTANEOUS
  Administered 2017-03-06: 5 [IU] via SUBCUTANEOUS
  Administered 2017-03-07: 3 [IU] via SUBCUTANEOUS
  Administered 2017-03-07: 5 [IU] via SUBCUTANEOUS
  Filled 2017-03-01: qty 5
  Filled 2017-03-01 (×3): qty 3
  Filled 2017-03-01: qty 5
  Filled 2017-03-01: qty 2
  Filled 2017-03-01 (×3): qty 5
  Filled 2017-03-01: qty 3
  Filled 2017-03-01 (×2): qty 2
  Filled 2017-03-01 (×2): qty 3

## 2017-03-01 MED ORDER — SODIUM CHLORIDE 0.9 % IV SOLN
INTRAVENOUS | Status: DC | PRN
  Administered 2017-03-01: 10:00:00 via INTRAVENOUS

## 2017-03-01 MED ORDER — MIDAZOLAM HCL 2 MG/2ML IJ SOLN
INTRAMUSCULAR | Status: AC
  Filled 2017-03-01: qty 2

## 2017-03-01 MED ORDER — HYDRALAZINE HCL 50 MG PO TABS
50.0000 mg | ORAL_TABLET | Freq: Three times a day (TID) | ORAL | Status: DC
  Administered 2017-03-01 – 2017-03-06 (×12): 50 mg via ORAL
  Filled 2017-03-01 (×14): qty 1

## 2017-03-01 SURGICAL SUPPLY — 45 items
BAG COUNTER SPONGE EZ (MISCELLANEOUS) ×1 IMPLANT
BAG SPNG 4X4 CLR HAZ (MISCELLANEOUS)
BANDAGE ELASTIC 3 LF NS (GAUZE/BANDAGES/DRESSINGS) ×1 IMPLANT
BANDAGE ELASTIC 4 LF NS (GAUZE/BANDAGES/DRESSINGS) ×3 IMPLANT
BANDAGE STRETCH 3X4.1 STRL (GAUZE/BANDAGES/DRESSINGS) ×3 IMPLANT
BNDG CMPR MED 5X3 ELC HKLP NS (GAUZE/BANDAGES/DRESSINGS)
BNDG CMPR MED 5X4 ELC HKLP NS (GAUZE/BANDAGES/DRESSINGS) ×1
BNDG ESMARK 4X12 TAN STRL LF (GAUZE/BANDAGES/DRESSINGS) ×1 IMPLANT
BNDG GAUZE 4.5X4.1 6PLY STRL (MISCELLANEOUS) ×3 IMPLANT
CANISTER SUCT 1200ML W/VALVE (MISCELLANEOUS) ×3 IMPLANT
COUNTER SPONGE BAG EZ (MISCELLANEOUS)
CUFF TOURN 18 STER (MISCELLANEOUS) ×1 IMPLANT
CUFF TOURN DUAL PL 12 NO SLV (MISCELLANEOUS) ×1 IMPLANT
DRAPE FLUOR MINI C-ARM 54X84 (DRAPES) ×1 IMPLANT
DURAPREP 26ML APPLICATOR (WOUND CARE) ×3 IMPLANT
ELECT REM PT RETURN 9FT ADLT (ELECTROSURGICAL)
ELECTRODE REM PT RTRN 9FT ADLT (ELECTROSURGICAL) ×1 IMPLANT
GAUZE PETRO XEROFOAM 1X8 (MISCELLANEOUS) ×1 IMPLANT
GAUZE SPONGE 4X4 12PLY STRL (GAUZE/BANDAGES/DRESSINGS) ×3 IMPLANT
GLOVE BIO SURGEON STRL SZ8 (GLOVE) ×3 IMPLANT
GLOVE INDICATOR 8.0 STRL GRN (GLOVE) ×3 IMPLANT
GOWN STRL REUS W/ TWL LRG LVL3 (GOWN DISPOSABLE) ×1 IMPLANT
GOWN STRL REUS W/TWL LRG LVL3 (GOWN DISPOSABLE) ×3
KIT DRSG VAC SLVR GRANUFM (MISCELLANEOUS) ×2 IMPLANT
KIT RM TURNOVER STRD PROC AR (KITS) ×3 IMPLANT
LABEL OR SOLS (LABEL) ×1 IMPLANT
NDL FILTER BLUNT 18X1 1/2 (NEEDLE) ×1 IMPLANT
NDL HYPO 25X1 1.5 SAFETY (NEEDLE) ×2 IMPLANT
NEEDLE FILTER BLUNT 18X 1/2SAF (NEEDLE)
NEEDLE FILTER BLUNT 18X1 1/2 (NEEDLE) IMPLANT
NEEDLE HYPO 25X1 1.5 SAFETY (NEEDLE) IMPLANT
NS IRRIG 500ML POUR BTL (IV SOLUTION) ×3 IMPLANT
PACK EXTREMITY ARMC (MISCELLANEOUS) ×3 IMPLANT
STOCKINETTE STRL 6IN 960660 (GAUZE/BANDAGES/DRESSINGS) ×3 IMPLANT
SUT ETHILON 3-0 FS-10 30 BLK (SUTURE)
SUT ETHILON 4-0 (SUTURE)
SUT ETHILON 4-0 FS2 18XMFL BLK (SUTURE)
SUT VIC AB 3-0 SH 27 (SUTURE)
SUT VIC AB 3-0 SH 27X BRD (SUTURE) ×1 IMPLANT
SUT VIC AB 4-0 FS2 27 (SUTURE) ×1 IMPLANT
SUTURE EHLN 3-0 FS-10 30 BLK (SUTURE) ×1 IMPLANT
SUTURE ETHLN 4-0 FS2 18XMF BLK (SUTURE) ×1 IMPLANT
SYR 3ML LL SCALE MARK (SYRINGE) ×2 IMPLANT
SYRINGE 10CC LL (SYRINGE) ×1 IMPLANT
WND VAC CANISTER 500ML (MISCELLANEOUS) ×2 IMPLANT

## 2017-03-01 NOTE — Progress Notes (Signed)
PT Cancellation Note  Patient Details Name: Steven [name] MRN: [id number] DOB: [birth year]   Cancelled Treatment:    Reason Eval/Treat Not Completed: Patient at procedure or test/unavailable (Consult received and chart reviewed.  Patient currently off unit for irrigation and debridement of lower extremity.  Per policy, will require new orders to initiate PT services post-op.  RN informed/aware and to place as medically appropriate.)   Jessie Cowher H. Owens Shark, PT, DPT, NCS 03/01/17, 9:58 AM [phone]

## 2017-03-01 NOTE — Progress Notes (Signed)
Inpatient Diabetes Program Recommendations  AACE/ADA: New Consensus Statement on Inpatient Glycemic Control (2015)  Target Ranges:  Prepandial:   less than 140 mg/dL      Peak postprandial:   less than 180 mg/dL (1-2 hours)      Critically ill patients:  140 - 180 mg/dL   Lab Results  Component Value Date   GLUCAP 196 (H) 03/01/2017   HGBA1C 8.2 (H) 02/28/2017     Inpatient Diabetes Program Recommendations:  Up to see the patient 2x- both times patient was off the unit and unavailable.   Please continue diabetes education when patient has returned from surgery and feeling better.     Discussed yesterday; Patient has been giving 70/30 insulin pre-breakfast and pre-bedtime "depends on my blood sugar".  Sometimes he doesn't give it if the blood sugars too low. Re-educated on timing and a set dose per MD 80 units bid.  Gentry Fitz, RN, BA, MHA, CDE Diabetes Coordinator Inpatient Diabetes Program  [phone] (Team Pager) [phone] ([place]) 03/01/2017 1:27 PM

## 2017-03-01 NOTE — Progress Notes (Signed)
Pre dialysis assessment 

## 2017-03-01 NOTE — OR Nursing (Signed)
Used wound vac #[id number]

## 2017-03-01 NOTE — Anesthesia Procedure Notes (Signed)
Spinal  Patient location during procedure: OR Staffing Performed: anesthesiologist  Preanesthetic Checklist Completed: patient identified, site marked, surgical consent, pre-op evaluation, timeout performed, IV checked, risks and benefits discussed and monitors and equipment checked Spinal Block Patient position: sitting Prep: Betadine Patient monitoring: heart rate, continuous pulse ox, blood pressure and cardiac monitor Approach: midline Location: L4-5 Injection technique: single-shot Needle Needle type: Whitacre and Introducer  Needle gauge: 25 G Needle length: 15 cm Assessment Sensory level: T6 Additional Notes Negative paresthesia. Negative blood return. Positive free-flowing CSF. Expiration date of kit checked and confirmed. Patient tolerated procedure well, without complications.

## 2017-03-01 NOTE — Progress Notes (Signed)
This note also relates to the following rows which could not be included: Pulse Rate - Cannot attach notes to unvalidated device data Resp - Cannot attach notes to unvalidated device data BP - Cannot attach notes to unvalidated device data SpO2 - Cannot attach notes to unvalidated device data  HD STARTED  

## 2017-03-01 NOTE — Progress Notes (Signed)
[facility]  ROUNDING NOTE   Subjective:   Seen and examined on hemodialysis. Second treatment. Tolerating treatment well.  First treatment yesterday. Tolerated treatment well.   I&D by Dr. Elvina Mattes today. Now with wound vac.   Objective:  Vital signs in last 24 hours:  Temp:  [98.1 F (36.7 C)-99.1 F (37.3 C)] 98.3 F (36.8 C) (05/25 1230) Pulse Rate:  [95-115] 101 (05/25 1430) Resp:  [11-22] 15 (05/25 1430) BP: (118-165)/(61-93) 133/88 (05/25 1430) SpO2:  [90 %-100 %] 90 % (05/25 1300) Weight:  [156 kg (343 lb 14.7 oz)-159.2 kg (350 lb 15.6 oz)] 159.2 kg (350 lb 15.6 oz) (05/25 1230)  Weight change: -2.895 kg (-6 lb 6.1 oz) Filed Weights   02/28/17 1515 02/28/17 1717 03/01/17 1230  Weight: (!) 156 kg (343 lb 14.7 oz) (!) 156 kg (343 lb 14.7 oz) (!) 159.2 kg (350 lb 15.6 oz)    Intake/Output: I/O last 3 completed shifts: In: 58 [P.O.:1180; IV Piggyback:300] Out: 500 [Urine:700]   Intake/Output this shift:  Total I/O In: 100 [I.V.:100] Out: 610 [Urine:600; Blood:10]  Physical Exam: General: NAD, laying in bed  Head: Normocephalic, atraumatic. Moist oral mucosal membranes  Eyes: Anicteric, PERRL  Neck: Supple, trachea midline  Lungs:  Clear to auscultation  Heart: Regular rate and rhythm  Abdomen:  Soft, nontender, obese  Extremities: ++ peripheral edema, right foot wound vac, clean dry intact dressings  Neurologic: Nonfocal, moving all four extremities  Skin: No lesions  Access: Right femoral temp HD catheter Dr. Lucky Cowboy 2/70    Basic Metabolic Panel:  Recent Labs Lab 02/27/17 1718 02/28/17 0443 02/28/17 1030 03/01/17 0446 03/01/17 1254  NA 134* 135  --   --  138  K 5.3* 4.8  --  4.6 4.0  CL 99* 101  --   --  102  CO2 22 24  --   --  28  GLUCOSE 251* 291*  --   --  190*  BUN 72* 71*  --   --  44*  CREATININE 8.24* 8.03*  --   --  6.03*  CALCIUM 8.6* 8.1*  --   --  7.8*  PHOS  --   --  6.4*  --  5.4*    Liver Function Tests:  Recent  Labs Lab 02/27/17 1718 03/01/17 1254  AST 17  --   ALT 17  --   ALKPHOS 129*  --   BILITOT 0.3  --   PROT 7.6  --   ALBUMIN 2.6* 2.1*   No results for input(s): LIPASE, AMYLASE in the last 168 hours. No results for input(s): AMMONIA in the last 168 hours.  CBC:  Recent Labs Lab 02/27/17 1718 02/28/17 0443 03/01/17 1254  WBC 11.1* 6.7 6.0  NEUTROABS 9.2*  --   --   HGB 9.4* 9.4* 8.5*  HCT 28.1* 27.7* 25.2*  MCV 86.1 86.5 86.5  PLT 337 332 302    Cardiac Enzymes: No results for input(s): CKTOTAL, CKMB, CKMBINDEX, TROPONINI in the last 168 hours.  BNP: Invalid input(s): POCBNP  CBG:  Recent Labs Lab 02/28/17 2118 03/01/17 0735 03/01/17 0901 03/01/17 1051 03/01/17 1159  GLUCAP 311* 208* 206* 201* 196*    Microbiology: Results for orders placed or performed during the hospital encounter of 02/27/17  Blood culture (routine x 2)     Status: None (Preliminary result)   Collection Time: 02/27/17  7:09 PM  Result Value Ref Range Status   Specimen Description BLOOD RIGHT HAND  Final  Special Requests Blood Culture adequate volume  Final   Culture NO GROWTH 2 DAYS  Final   Report Status PENDING  Incomplete  Blood culture (routine x 2)     Status: None (Preliminary result)   Collection Time: 02/27/17  7:09 PM  Result Value Ref Range Status   Specimen Description BLOOD RIGHT AC  Final   Special Requests Blood Culture adequate volume  Final   Culture NO GROWTH 2 DAYS  Final   Report Status PENDING  Incomplete  Aerobic/Anaerobic Culture (surgical/deep wound)     Status: None (Preliminary result)   Collection Time: 02/28/17  3:30 PM  Result Value Ref Range Status   Specimen Description FOOT  Final   Special Requests Immunocompromised  Final   Gram Stain   Final    MODERATE WBC PRESENT,BOTH PMN AND MONONUCLEAR ABUNDANT GRAM NEGATIVE RODS ABUNDANT GRAM POSITIVE COCCI FEW GRAM POSITIVE RODS    Culture   Final    CULTURE REINCUBATED FOR BETTER GROWTH Performed  at [facility], [place] [address] [place], [place] [address]    Report Status PENDING  Incomplete  Surgical PCR screen     Status: Abnormal   Collection Time: 02/28/17  5:00 PM  Result Value Ref Range Status   MRSA, PCR NEGATIVE NEGATIVE Final   Staphylococcus aureus POSITIVE (A) NEGATIVE Final    Comment:        The Xpert SA Assay (FDA approved for NASAL specimens in patients over 11 years of age), is one component of a comprehensive surveillance program.  Test performance has been validated by [facility] for patients greater than or equal to 57 year old. It is not intended to diagnose infection nor to guide or monitor treatment.     Coagulation Studies: No results for input(s): LABPROT, INR in the last 72 hours.  Urinalysis:  Recent Labs  02/27/17 1718  COLORURINE YELLOW*  LABSPEC 1.012  PHURINE 5.0  GLUCOSEU >=500*  HGBUR SMALL*  BILIRUBINUR NEGATIVE  KETONESUR NEGATIVE  PROTEINUR >=300*  NITRITE NEGATIVE  LEUKOCYTESUR NEGATIVE      Imaging: Dg Chest 2 View  Result Date: 02/27/2017 CLINICAL DATA:  56 y/o  M; weakness. EXAM: CHEST  2 VIEW COMPARISON:  None. FINDINGS: The heart size and mediastinal contours are within normal limits. Both lungs are clear. The visualized skeletal structures are unremarkable. IMPRESSION: No active cardiopulmonary disease. Electronically Signed   By: Kristine Garbe M.D.   On: 02/27/2017 19:21   Dg Elbow Complete Left  Result Date: 02/27/2017 CLINICAL DATA:  Left elbow pain.  Known left elbow fracture. EXAM: LEFT ELBOW - COMPLETE 3+ VIEW COMPARISON:  01/18/2017 and 02/05/2017 FINDINGS: Left elbow is in a fiberglass cast or splint. There appears to be increased impaction of the comminuted radial head fracture. Alignment of the elbow is abnormal with posterior subluxation of the elbow. Again noted is a displaced fracture involving the distal humerus lateral epicondyle. There also appears to be a fracture along the medial  aspect of the olecranon or possibly the coronoid process. IMPRESSION: Abnormal alignment of the left elbow with posterior subluxation. Increased displacement and impaction of the radial head fracture. There appears to be additional fractures involving the distal humeral lateral epicondyle and probably the olecranon or coronoid process. Electronically Signed   By: Markus Daft M.D.   On: 02/27/2017 19:31   Dg Foot Complete Right  Result Date: 02/27/2017 CLINICAL DATA:  Wound at the right third through fifth metatarsals, with severe edema. Initial  encounter. EXAM: RIGHT FOOT COMPLETE - 3+ VIEW COMPARISON:  None. FINDINGS: There is no evidence of fracture or dislocation. The joint spaces are preserved. There is no evidence of talar subluxation; the subtalar joint is unremarkable in appearance. An os peroneum is noted. There is question of vague lucency at the medial aspects of the fourth and fifth metatarsal heads. Mild osteomyelitis cannot be excluded. Diffuse soft tissue edema is noted about the forefoot and dorsum of the midfoot. Scattered soft tissue air is noted tracking about the lateral aspect of the forefoot. Would correlate clinically to exclude necrotizing fasciitis. IMPRESSION: 1. No evidence of fracture or dislocation. 2. Os peroneum noted. 3. Question of vague lucency at the medial aspects of the fourth and fifth metatarsal heads. Mild osteomyelitis cannot be excluded. 4. Scattered soft tissue air tracking about the lateral aspect of the forefoot. Would correlate clinically to exclude necrotizing fasciitis These results were called by telephone at the time of interpretation on 02/27/2017 at 7:22 pm to Dr. Shirlyn Goltz, who verbally acknowledged these results. Electronically Signed   By: Garald Balding M.D.   On: 02/27/2017 19:24     Medications:   . piperacillin-tazobactam (ZOSYN)  IV 0 g (03/01/17 0321)  . vancomycin 1,000 mg (03/01/17 1405)   . amLODipine  10 mg Oral Daily  . aspirin EC  81 mg  Oral Daily  . Chlorhexidine Gluconate Cloth  6 each Topical Daily  . heparin  5,000 Units Subcutaneous Q8H  . hydrALAZINE  50 mg Oral Q8H  . insulin aspart  0-15 Units Subcutaneous TID WC  . insulin glargine  30 Units Subcutaneous Daily  . living well with diabetes book   Does not apply Once  . mupirocin ointment  1 application Nasal BID  . tuberculin  5 Units Intradermal Once   docusate sodium, oxyCODONE-acetaminophen, vancomycin  Assessment/ Plan:  [name] is a 56 y.o. white male  with hypertension, diabetes mellitus type 2 insulin dependent, hyperlipidemia, GERD, and morbid obesity  1. 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 No biopsy.  Hyperkalemia on admission Initiated on hemodialysis on this admission. First dialysis was 5/24. Tolerated treatment well.  Seen and examined on hemodialysis second treatment.  Third treatment for tomorrow.  - PPD to be read on 5/26.  - Will need permcath before discharge.   2. Hypertension: blood pressure at goal. Currently holding lisinopril.   3. Anemia of chronic kidney disease: hemoglobin 8.5. Normocytic - Will initiate epo therapy with HD treatment.   4. Secondary Hyperparathyroidism: calcium at goal. Not currently on vitamin D. PTH 121. Phosphorus 5.4 - at goal. No currently on phosphate binders.   5. Diabetes mellitus type II with chronic kidney disease: with diabetic foot ulcer right foot status post I&D 5/25 Dr. Elvina Mattes.  Blood cultures pending. Deep wound care done today.  - Continue glucose control.  - empiric antibiotic zosyn and vanco - Appreciate podiatry input.     LOS: 2 Cinda Hara 5/25/20182:49 PM

## 2017-03-01 NOTE — Progress Notes (Signed)
Pharmacy Antibiotic Note  Steven [name] is a 56 y.o. male admitted on 02/27/2017 with cellulitis.  Pharmacy has been consulted for vancomycin and piperacillin-tazobactam dosing.  Plan: Continue Vanc 1g to be given with each HD. Will plan for random vanc level to be drawn prior to 3rd HD session.   Continue  piperacillin-tazobactam 3.375g IV Q12h (4 hour infusion).    Height: 5\' 10"  (177.8 cm) Weight: (!) 350 lb 15.6 oz (159.2 kg) IBW/kg (Calculated) : 73  Temp (24hrs), Avg:98.5 F (36.9 C), Min:98.1 F (36.7 C), Max:99.1 F (37.3 C)   Recent Labs Lab 02/27/17 1718 02/28/17 0443 03/01/17 1254  WBC 11.1* 6.7 6.0  CREATININE 8.24* 8.03* 6.03*  LATICACIDVEN 1.3  --   --     Estimated Creatinine Clearance: 21 mL/min (A) (by C-G formula based on SCr of 6.03 mg/dL (H)).    Allergies  Allergen Reactions  . Codeine Nausea And Vomiting    Antimicrobials this admission: Vancomycin 5/23 >> Zosyn 5/23 >>  Microbiology results: 5/23 BCx: ordered 5/24 Wound Cx: GPC, GPR, GNR  Thank you for allowing pharmacy to be a part of this patient's care.  Pernell Dupre, PharmD 03/01/2017 2:46 PM

## 2017-03-01 NOTE — Consult Note (Signed)
[facility] Infectious Disease     Reason for Consult: DM foot infection   Referring Physician: Dolores Frame Date of Admission:  02/27/2017   Principal Problem:   Cellulitis in diabetic foot ([place]) Active Problems:   Acute on chronic renal failure ([place])   Cellulitis   HPI: Steven [name] is a 56 y.o. male with DM, CKD admitted with  R foot redenss and swelling. Began as a blister on top of foot. He since admission has had surgery 5/25 with I and D of abscess and necrotic tissue. He has also had progressive renal dysfxn and has started HD. Cx are pending, MRSA PCR negative   Past Medical History:  Diagnosis Date  . Cellulitis and abscess of right lower extremity 03/01/2017  . Chronic kidney disease   . Diabetes mellitus without complication ([place])   . Edema extremities 03/01/2017   bilateral swelling  . High cholesterol   . High triglycerides   . Hypertension   . Sleep apnea    can't use cpap d/t feelings of suffocation   Past Surgical History:  Procedure Laterality Date  . APPENDECTOMY  2010   Social History  Substance Use Topics  . Smoking status: Never Smoker  . Smokeless tobacco: Never Used  . Alcohol use Yes     Comment: up until 01/2017 was heavy drinker...4 40 oz qd   Family History  Problem Relation Age of Onset  . CAD Father     Allergies:  Allergies  Allergen Reactions  . Codeine Nausea And Vomiting    Current antibiotics: Antibiotics Given (last 72 hours)    Date/Time Action Medication Dose Rate   02/27/17 1922 New Bag/Given   piperacillin-tazobactam (ZOSYN) IVPB 3.375 g 3.375 g 100 mL/hr   02/27/17 1957 New Bag/Given   vancomycin (VANCOCIN) 2,000 mg in sodium chloride 0.9 % 500 mL IVPB 2,000 mg 250 mL/hr   02/28/17 1039 New Bag/Given   piperacillin-tazobactam (ZOSYN) IVPB 3.375 g 3.375 g 12.5 mL/hr   02/28/17 1634 New Bag/Given   vancomycin (VANCOCIN) IVPB 1000 mg/200 mL premix 1,000 mg 200 mL/hr   02/28/17 2321 New Bag/Given    piperacillin-tazobactam (ZOSYN) IVPB 3.375 g 3.375 g 12.5 mL/hr   03/01/17 0955 Given   piperacillin-tazobactam (ZOSYN) IVPB 3.375 g 3.375 g    03/01/17 1405 New Bag/Given  [GIVEN DURING DIALYSIS]   vancomycin (VANCOCIN) IVPB 1000 mg/200 mL premix 1,000 mg 200 mL/hr      MEDICATIONS: . amLODipine  10 mg Oral Daily  . aspirin EC  81 mg Oral Daily  . Chlorhexidine Gluconate Cloth  6 each Topical Daily  . heparin  5,000 Units Subcutaneous Q8H  . hydrALAZINE  50 mg Oral Q8H  . insulin aspart  0-15 Units Subcutaneous TID WC  . insulin glargine  30 Units Subcutaneous Daily  . living well with diabetes book   Does not apply Once  . mupirocin ointment  1 application Nasal BID  . tuberculin  5 Units Intradermal Once    Review of Systems - 11 systems reviewed and negative per HPI   OBJECTIVE: Temp:  [98.1 F (36.7 C)-99.1 F (37.3 C)] 98.6 F (37 C) (05/25 1619) Pulse Rate:  [95-110] 97 (05/25 1619) Resp:  [11-22] 18 (05/25 1619) BP: (118-165)/(61-93) 144/84 (05/25 1619) SpO2:  [90 %-100 %] 96 % (05/25 1619) Weight:  [156 kg (343 lb 14.7 oz)-159.2 kg (350 lb 15.6 oz)] 158 kg (348 lb 5.2 oz) (05/25 1505) Physical Exam  Constitutional: morbidly obese  HENT:  anicteric Mouth/Throat: Oropharynx is clear and moist. No oropharyngeal exudate.  Cardiovascular: Normal rate, regular rhythm and normal heart sounds. Exam reveals no gallop and no friction rub.  No murmur heard.  Pulmonary/Chest: Effort normal and breath sounds normal. No respiratory distress. He has no wheezes.  Abdominal: Soft. Bowel sounds are normal. He exhibits no distension. There is no tenderness.  Lymphadenopathy: He has no cervical adenopathy.  Neurological: He is alert and oriented to person, place, and time.  Skin: R leg wrapped post op. Psychiatric: He has a normal mood and affect. His behavior is normal.     LABS: Results for orders placed or performed during the hospital encounter of 02/27/17 (from the past 48  hour(s))  Lactic acid, plasma     Status: None   Collection Time: 02/27/17  5:18 PM  Result Value Ref Range   Lactic Acid, Venous 1.3 0.5 - 1.9 mmol/L  Comprehensive metabolic panel     Status: Abnormal   Collection Time: 02/27/17  5:18 PM  Result Value Ref Range   Sodium 134 (L) 135 - 145 mmol/L   Potassium 5.3 (H) 3.5 - 5.1 mmol/L   Chloride 99 (L) 101 - 111 mmol/L   CO2 22 22 - 32 mmol/L   Glucose, Bld 251 (H) 65 - 99 mg/dL   BUN 72 (H) 6 - 20 mg/dL   Creatinine, Ser 8.24 (H) 0.61 - 1.24 mg/dL   Calcium 8.6 (L) 8.9 - 10.3 mg/dL   Total Protein 7.6 6.5 - 8.1 g/dL   Albumin 2.6 (L) 3.5 - 5.0 g/dL   AST 17 15 - 41 U/L   ALT 17 17 - 63 U/L   Alkaline Phosphatase 129 (H) 38 - 126 U/L   Total Bilirubin 0.3 0.3 - 1.2 mg/dL   GFR calc non Af Amer 6 (L) >60 mL/min   GFR calc Af Amer 8 (L) >60 mL/min    Comment: (NOTE) The eGFR has been calculated using the CKD EPI equation. This calculation has not been validated in all clinical situations. eGFR's persistently <60 mL/min signify possible Chronic Kidney Disease.    Anion gap 13 5 - 15  CBC with Differential     Status: Abnormal   Collection Time: 02/27/17  5:18 PM  Result Value Ref Range   WBC 11.1 (H) 3.8 - 10.6 K/uL   RBC 3.26 (L) 4.40 - 5.90 MIL/uL   Hemoglobin 9.4 (L) 13.0 - 18.0 g/dL   HCT 28.1 (L) 40.0 - 52.0 %   MCV 86.1 80.0 - 100.0 fL   MCH 28.9 26.0 - 34.0 pg   MCHC 33.5 32.0 - 36.0 g/dL   RDW 13.1 11.5 - 14.5 %   Platelets 337 150 - 440 K/uL   Neutrophils Relative % 83 %   Neutro Abs 9.2 (H) 1.4 - 6.5 K/uL   Lymphocytes Relative 6 %   Lymphs Abs 0.7 (L) 1.0 - 3.6 K/uL   Monocytes Relative 8 %   Monocytes Absolute 0.9 0.2 - 1.0 K/uL   Eosinophils Relative 2 %   Eosinophils Absolute 0.2 0 - 0.7 K/uL   Basophils Relative 1 %   Basophils Absolute 0.1 0 - 0.1 K/uL  Urinalysis, Complete w Microscopic     Status: Abnormal   Collection Time: 02/27/17  5:18 PM  Result Value Ref Range   Color, Urine YELLOW (A) YELLOW    APPearance HAZY (A) CLEAR   Specific Gravity, Urine 1.012 1.005 - 1.030   pH 5.0 5.0 - 8.0  Glucose, UA >=500 (A) NEGATIVE mg/dL   Hgb urine dipstick SMALL (A) NEGATIVE   Bilirubin Urine NEGATIVE NEGATIVE   Ketones, ur NEGATIVE NEGATIVE mg/dL   Protein, ur >=300 (A) NEGATIVE mg/dL   Nitrite NEGATIVE NEGATIVE   Leukocytes, UA NEGATIVE NEGATIVE   RBC / HPF 0-5 0 - 5 RBC/hpf   WBC, UA 0-5 0 - 5 WBC/hpf   Bacteria, UA NONE SEEN NONE SEEN   Squamous Epithelial / LPF 0-5 (A) NONE SEEN   Amorphous Crystal PRESENT   Hemoglobin A1c     Status: Abnormal   Collection Time: 02/27/17  5:18 PM  Result Value Ref Range   Hgb A1c MFr Bld 7.6 (H) 4.8 - 5.6 %    Comment: (NOTE)         Pre-diabetes: 5.7 - 6.4         Diabetes: >6.4         Glycemic control for adults with diabetes: <7.0    Mean Plasma Glucose 171 mg/dL    Comment: (NOTE) Performed At: [facility] [place], [place] [id number] Lindon Romp MD KV:[id number]   Glucose, capillary     Status: Abnormal   Collection Time: 02/27/17  5:23 PM  Result Value Ref Range   Glucose-Capillary 229 (H) 65 - 99 mg/dL   Comment 1 Notify RN    Comment 2 Document in Chart   Blood culture (routine x 2)     Status: None (Preliminary result)   Collection Time: 02/27/17  7:09 PM  Result Value Ref Range   Specimen Description BLOOD RIGHT HAND    Special Requests Blood Culture adequate volume    Culture NO GROWTH 2 DAYS    Report Status PENDING   Blood culture (routine x 2)     Status: None (Preliminary result)   Collection Time: 02/27/17  7:09 PM  Result Value Ref Range   Specimen Description BLOOD RIGHT AC    Special Requests Blood Culture adequate volume    Culture NO GROWTH 2 DAYS    Report Status PENDING   Glucose, capillary     Status: Abnormal   Collection Time: 02/27/17 10:43 PM  Result Value Ref Range   Glucose-Capillary 310 (H) 65 - 99 mg/dL   Comment 1 Notify RN   Glucose, capillary     Status:  Abnormal   Collection Time: 02/28/17  1:45 AM  Result Value Ref Range   Glucose-Capillary 332 (H) 65 - 99 mg/dL  Basic metabolic panel     Status: Abnormal   Collection Time: 02/28/17  4:43 AM  Result Value Ref Range   Sodium 135 135 - 145 mmol/L   Potassium 4.8 3.5 - 5.1 mmol/L   Chloride 101 101 - 111 mmol/L   CO2 24 22 - 32 mmol/L   Glucose, Bld 291 (H) 65 - 99 mg/dL   BUN 71 (H) 6 - 20 mg/dL   Creatinine, Ser 8.03 (H) 0.61 - 1.24 mg/dL   Calcium 8.1 (L) 8.9 - 10.3 mg/dL   GFR calc non Af Amer 7 (L) >60 mL/min   GFR calc Af Amer 8 (L) >60 mL/min    Comment: (NOTE) The eGFR has been calculated using the CKD EPI equation. This calculation has not been validated in all clinical situations. eGFR's persistently <60 mL/min signify possible Chronic Kidney Disease.    Anion gap 10 5 - 15  CBC     Status: Abnormal   Collection Time: 02/28/17  4:43 AM  Result Value Ref Range   WBC 6.7 3.8 - 10.6 K/uL   RBC 3.19 (L) 4.40 - 5.90 MIL/uL   Hemoglobin 9.4 (L) 13.0 - 18.0 g/dL   HCT 27.7 (L) 40.0 - 52.0 %   MCV 86.5 80.0 - 100.0 fL   MCH 29.3 26.0 - 34.0 pg   MCHC 33.9 32.0 - 36.0 g/dL   RDW 13.4 11.5 - 14.5 %   Platelets 332 150 - 440 K/uL  HIV antibody     Status: None   Collection Time: 02/28/17  4:43 AM  Result Value Ref Range   HIV Screen 4th Generation wRfx Non Reactive Non Reactive    Comment: (NOTE) Performed At: [facility] [address] [place], [place] [id number] Lindon Romp MD IZ:[id number]   Glucose, capillary     Status: Abnormal   Collection Time: 02/28/17  5:31 AM  Result Value Ref Range   Glucose-Capillary 259 (H) 65 - 99 mg/dL   Comment 1 Notify RN   Glucose, capillary     Status: Abnormal   Collection Time: 02/28/17  7:49 AM  Result Value Ref Range   Glucose-Capillary 250 (H) 65 - 99 mg/dL  Hemoglobin A1c     Status: Abnormal   Collection Time: 02/28/17 10:30 AM  Result Value Ref Range   Hgb A1c MFr Bld 8.2 (H) 4.8 - 5.6 %    Comment:  (NOTE)         Pre-diabetes: 5.7 - 6.4         Diabetes: >6.4         Glycemic control for adults with diabetes: <7.0    Mean Plasma Glucose 189 mg/dL    Comment: (NOTE) Performed At: [facility] [place], [place] [id number] Lindon Romp MD [id number]   Hepatitis B surface antibody     Status: None   Collection Time: 02/28/17 10:30 AM  Result Value Ref Range   Hep B S Ab Non Reactive     Comment: (NOTE)              Non Reactive: Inconsistent with immunity,                            less than 10 mIU/mL              Reactive:     Consistent with immunity,                            greater than 9.9 mIU/mL Performed At: [facility] [place], [place] [id number] Lindon Romp MD [id number]   Hepatitis B core antibody, IgM     Status: None   Collection Time: 02/28/17 10:30 AM  Result Value Ref Range   Hep B C IgM Negative Negative    Comment: (NOTE) Performed At: [facility] [place], [place] [id number] Lindon Romp MD [id number]   Hepatitis B surface antigen     Status: None   Collection Time: 02/28/17 10:30 AM  Result Value Ref Range   Hepatitis B Surface Ag Negative Negative    Comment: (NOTE) Performed At: [facility] [address] [place], [place] [id number] Lindon Romp MD [id number]   Parathyroid hormone, intact (no Ca)     Status: Abnormal   Collection Time: 02/28/17 10:30 AM  Result Value Ref Range   PTH 121 (H)  15 - 65 pg/mL    Comment: (NOTE) Performed At: [facility] [place], [place] [id number] Lindon Romp MD [id number]   Phosphorus     Status: Abnormal   Collection Time: 02/28/17 10:30 AM  Result Value Ref Range   Phosphorus 6.4 (H) 2.5 - 4.6 mg/dL  Ferritin     Status: None   Collection Time: 02/28/17 10:30 AM  Result Value Ref Range   Ferritin 143 24 - 336 ng/mL  Iron and TIBC     Status: Abnormal   Collection  Time: 02/28/17 10:30 AM  Result Value Ref Range   Iron 30 (L) 45 - 182 ug/dL   TIBC 205 (L) 250 - 450 ug/dL   Saturation Ratios 15 (L) 17.9 - 39.5 %   UIBC 175 ug/dL  Glucose, capillary     Status: Abnormal   Collection Time: 02/28/17 11:49 AM  Result Value Ref Range   Glucose-Capillary 276 (H) 65 - 99 mg/dL  Protein / creatinine ratio, urine     Status: Abnormal   Collection Time: 02/28/17 12:27 PM  Result Value Ref Range   Creatinine, Urine 62 mg/dL   Total Protein, Urine 613 mg/dL    Comment: RESULT CONFIRMED BY MANUAL DILUTION.MSS NO NORMAL RANGE ESTABLISHED FOR THIS TEST    Protein Creatinine Ratio 9.89 (H) 0.00 - 0.15 mg/mg[Cre]  Hepatitis B core antibody, total     Status: None   Collection Time: 02/28/17  3:00 PM  Result Value Ref Range   Hep B Core Total Ab Negative Negative    Comment: (NOTE) Performed At: [facility] [place], [place] [id number] Lindon Romp MD [id number]   Vitamin B12     Status: None   Collection Time: 02/28/17  3:00 PM  Result Value Ref Range   Vitamin B-12 346 180 - 914 pg/mL    Comment: (NOTE) This assay is not validated for testing neonatal or myeloproliferative syndrome specimens for Vitamin B12 levels. Performed at [facility], [place] [address] [place], [place] [address]   Aerobic/Anaerobic Culture (surgical/deep wound)     Status: None (Preliminary result)   Collection Time: 02/28/17  3:30 PM  Result Value Ref Range   Specimen Description FOOT    Special Requests Immunocompromised    Gram Stain      MODERATE WBC PRESENT,BOTH PMN AND MONONUCLEAR ABUNDANT GRAM NEGATIVE RODS ABUNDANT GRAM POSITIVE COCCI FEW GRAM POSITIVE RODS    Culture      CULTURE REINCUBATED FOR BETTER GROWTH Performed at [facility], [place] [address], [place], [place] [address]    Report Status PENDING   Surgical PCR screen     Status: Abnormal   Collection Time: 02/28/17  5:00 PM  Result Value Ref Range   MRSA, PCR  NEGATIVE NEGATIVE   Staphylococcus aureus POSITIVE (A) NEGATIVE    Comment:        The Xpert SA Assay (FDA approved for NASAL specimens in patients over 45 years of age), is one component of a comprehensive surveillance program.  Test performance has been validated by [facility] for patients greater than or equal to 40 year old. It is not intended to diagnose infection nor to guide or monitor treatment.   Glucose, capillary     Status: Abnormal   Collection Time: 02/28/17  6:43 PM  Result Value Ref Range   Glucose-Capillary 243 (H) 65 - 99 mg/dL  Glucose, capillary     Status: Abnormal  Collection Time: 02/28/17  9:18 PM  Result Value Ref Range   Glucose-Capillary 311 (H) 65 - 99 mg/dL  Folate     Status: None   Collection Time: 03/01/17  4:46 AM  Result Value Ref Range   Folate 14.5 >5.9 ng/mL  Potassium     Status: None   Collection Time: 03/01/17  4:46 AM  Result Value Ref Range   Potassium 4.6 3.5 - 5.1 mmol/L  Glucose, capillary     Status: Abnormal   Collection Time: 03/01/17  7:35 AM  Result Value Ref Range   Glucose-Capillary 208 (H) 65 - 99 mg/dL  Glucose, capillary     Status: Abnormal   Collection Time: 03/01/17  9:01 AM  Result Value Ref Range   Glucose-Capillary 206 (H) 65 - 99 mg/dL  Glucose, capillary     Status: Abnormal   Collection Time: 03/01/17 10:51 AM  Result Value Ref Range   Glucose-Capillary 201 (H) 65 - 99 mg/dL  Glucose, capillary     Status: Abnormal   Collection Time: 03/01/17 11:59 AM  Result Value Ref Range   Glucose-Capillary 196 (H) 65 - 99 mg/dL  Renal function panel     Status: Abnormal   Collection Time: 03/01/17 12:54 PM  Result Value Ref Range   Sodium 138 135 - 145 mmol/L   Potassium 4.0 3.5 - 5.1 mmol/L   Chloride 102 101 - 111 mmol/L   CO2 28 22 - 32 mmol/L   Glucose, Bld 190 (H) 65 - 99 mg/dL   BUN 44 (H) 6 - 20 mg/dL   Creatinine, Ser 6.03 (H) 0.61 - 1.24 mg/dL   Calcium 7.8 (L) 8.9 - 10.3 mg/dL   Phosphorus 5.4  (H) 2.5 - 4.6 mg/dL   Albumin 2.1 (L) 3.5 - 5.0 g/dL   GFR calc non Af Amer 9 (L) >60 mL/min   GFR calc Af Amer 11 (L) >60 mL/min    Comment: (NOTE) The eGFR has been calculated using the CKD EPI equation. This calculation has not been validated in all clinical situations. eGFR's persistently <60 mL/min signify possible Chronic Kidney Disease.    Anion gap 8 5 - 15  CBC     Status: Abnormal   Collection Time: 03/01/17 12:54 PM  Result Value Ref Range   WBC 6.0 3.8 - 10.6 K/uL   RBC 2.92 (L) 4.40 - 5.90 MIL/uL   Hemoglobin 8.5 (L) 13.0 - 18.0 g/dL   HCT 25.2 (L) 40.0 - 52.0 %   MCV 86.5 80.0 - 100.0 fL   MCH 29.3 26.0 - 34.0 pg   MCHC 33.9 32.0 - 36.0 g/dL   RDW 13.3 11.5 - 14.5 %   Platelets 302 150 - 440 K/uL  Glucose, capillary     Status: Abnormal   Collection Time: 03/01/17  4:28 PM  Result Value Ref Range   Glucose-Capillary 137 (H) 65 - 99 mg/dL   No components found for: ESR, C REACTIVE PROTEIN MICRO: Recent Results (from the past 720 hour(s))  Blood culture (routine x 2)     Status: None (Preliminary result)   Collection Time: 02/27/17  7:09 PM  Result Value Ref Range Status   Specimen Description BLOOD RIGHT HAND  Final   Special Requests Blood Culture adequate volume  Final   Culture NO GROWTH 2 DAYS  Final   Report Status PENDING  Incomplete  Blood culture (routine x 2)     Status: None (Preliminary result)   Collection Time: 02/27/17  7:09 PM  Result Value Ref Range Status   Specimen Description BLOOD RIGHT AC  Final   Special Requests Blood Culture adequate volume  Final   Culture NO GROWTH 2 DAYS  Final   Report Status PENDING  Incomplete  Aerobic/Anaerobic Culture (surgical/deep wound)     Status: None (Preliminary result)   Collection Time: 02/28/17  3:30 PM  Result Value Ref Range Status   Specimen Description FOOT  Final   Special Requests Immunocompromised  Final   Gram Stain   Final    MODERATE WBC PRESENT,BOTH PMN AND MONONUCLEAR ABUNDANT GRAM  NEGATIVE RODS ABUNDANT GRAM POSITIVE COCCI FEW GRAM POSITIVE RODS    Culture   Final    CULTURE REINCUBATED FOR BETTER GROWTH Performed at [facility], [place] [address], [place], [place] [address]    Report Status PENDING  Incomplete  Surgical PCR screen     Status: Abnormal   Collection Time: 02/28/17  5:00 PM  Result Value Ref Range Status   MRSA, PCR NEGATIVE NEGATIVE Final   Staphylococcus aureus POSITIVE (A) NEGATIVE Final    Comment:        The Xpert SA Assay (FDA approved for NASAL specimens in patients over 66 years of age), is one component of a comprehensive surveillance program.  Test performance has been validated by [facility] for patients greater than or equal to 58 year old. It is not intended to diagnose infection nor to guide or monitor treatment.     IMAGING: Dg Chest 2 View  Result Date: 02/27/2017 CLINICAL DATA:  56 y/o  M; weakness. EXAM: CHEST  2 VIEW COMPARISON:  None. FINDINGS: The heart size and mediastinal contours are within normal limits. Both lungs are clear. The visualized skeletal structures are unremarkable. IMPRESSION: No active cardiopulmonary disease. Electronically Signed   By: Kristine Garbe M.D.   On: 02/27/2017 19:21   Dg Elbow 2 Views Left  Result Date: 02/04/2017 Radiology reading dictated by Dr. 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  Known left elbow fracture. EXAM: LEFT ELBOW - COMPLETE 3+ VIEW COMPARISON:  01/18/2017 and 02/05/2017 FINDINGS:  Left elbow is in a fiberglass cast or splint. There appears to be increased impaction of the comminuted radial head fracture. Alignment of the elbow is abnormal with posterior subluxation of the elbow. Again noted is a displaced fracture involving the distal humerus lateral epicondyle. There also appears to be a fracture along the medial aspect of the olecranon or possibly the coronoid process. IMPRESSION: Abnormal alignment of the left elbow with posterior subluxation. Increased displacement and impaction of the radial head fracture. There appears to be additional fractures involving the distal humeral lateral epicondyle and probably the olecranon or coronoid process. Electronically Signed   By: Markus Daft M.D.   On: 02/27/2017 19:31   Dg Elbow Complete Left  Result Date: 02/05/2017 CLINICAL DATA:  Left elbow pain after new injury to the left elbow. Known radial head fracture sustained 2 weeks ago. EXAM: LEFT ELBOW - COMPLETE 3+ VIEW COMPARISON:  Radiographs from  02/01/2017 and 01/18/2017 FINDINGS: A posterior plaster splint obscures fine bony detail. There is a slightly depressed radial head fracture with similar appearance to the study from 02/01/2017 but with further displacement of a small fracture fragment along the volar aspect of the elbow joint. No new fracture lucencies are seen. No dislocation of the elbow joint. Small joint effusion with anterior posterior fat pad signs. IMPRESSION: Comminuted appearing fracture involving the radial head with further volar displacement of a small fracture fragment best seen on the lateral view. Stable mild depression of the radial head is noted since 02/01/2017 comparison. Small joint effusion. Electronically Signed   By: Ashley Royalty M.D.   On: 02/05/2017 19:50   Dg Foot Complete Right  Result Date: 02/27/2017 CLINICAL DATA:  Wound at the right third through fifth metatarsals, with severe edema. Initial encounter. EXAM: RIGHT FOOT COMPLETE - 3+ VIEW COMPARISON:   None. FINDINGS: There is no evidence of fracture or dislocation. The joint spaces are preserved. There is no evidence of talar subluxation; the subtalar joint is unremarkable in appearance. An os peroneum is noted. There is question of vague lucency at the medial aspects of the fourth and fifth metatarsal heads. Mild osteomyelitis cannot be excluded. Diffuse soft tissue edema is noted about the forefoot and dorsum of the midfoot. Scattered soft tissue air is noted tracking about the lateral aspect of the forefoot. Would correlate clinically to exclude necrotizing fasciitis. IMPRESSION: 1. No evidence of fracture or dislocation. 2. Os peroneum noted. 3. Question of vague lucency at the medial aspects of the fourth and fifth metatarsal heads. Mild osteomyelitis cannot be excluded. 4. Scattered soft tissue air tracking about the lateral aspect of the forefoot. Would correlate clinically to exclude necrotizing fasciitis These results were called by telephone at the time of interpretation on 02/27/2017 at 7:22 pm to Dr. Shirlyn Goltz, who verbally acknowledged these results. Electronically Signed   By: Garald Balding M.D.   On: 02/27/2017 19:24    Assessment:   Steven [name] is a 56 y.o. male with DM, new onset ESRD now with R DM Foot infection s/p surgery 5/25. Cultures are pending. He has started HD. Will be set up for HD as outpt.   Recommendations Continue vanco and zosyn pending cultures Hopefully can be treated with IV abx at HD. Thank you very much for allowing me to participate in the care of this patient. Please call with questions.   Cheral Marker. Ola Spurr, MD

## 2017-03-01 NOTE — Anesthesia Postprocedure Evaluation (Signed)
Anesthesia Post Note  Patient: Steven [name]  Procedure(s) Performed: Procedure(s) (LRB): IRRIGATION AND DEBRIDEMENT FOOT (Right)  Patient location during evaluation: PACU Anesthesia Type: Spinal Level of consciousness: oriented and awake and alert Pain management: pain level controlled Vital Signs Assessment: post-procedure vital signs reviewed and stable Respiratory status: spontaneous breathing, respiratory function stable and patient connected to nasal cannula oxygen Cardiovascular status: blood pressure returned to baseline and stable Postop Assessment: no headache and no backache Anesthetic complications: no     Last Vitals:  Vitals:   03/01/17 1102 03/01/17 1110  BP:  (!) 165/93  Pulse: 97 97  Resp: 13 13  Temp:      Last Pain:  Vitals:   03/01/17 1055  TempSrc:   PainSc: 0-No pain    LLE Motor Response: Purposeful movement (03/01/17 1114)   RLE Motor Response: Purposeful movement (03/01/17 1114)        Molli Barrows

## 2017-03-01 NOTE — Progress Notes (Signed)
Pharmacy Antibiotic Note  Steven [name] is a 56 y.o. male admitted on 02/27/2017 with cellulitis.  Pharmacy has been consulted for vancomycin and piperacillin-tazobactam dosing.  Plan: Vancomycin 2000mg  IV x1, followed by Vanc 1g to be given with HD. Will plan for random vanc level to be drawn prior to 3rd HD session.   Continue  piperacillin-tazobactam 3.375g IV Q12h (4 hour infusion).    Height: 5\' 10"  (177.8 cm) Weight: (!) 343 lb 14.7 oz (156 kg) IBW/kg (Calculated) : 73  Temp (24hrs), Avg:98.6 F (37 C), Min:98.2 F (36.8 C), Max:99.1 F (37.3 C)   Recent Labs Lab 02/27/17 1718 02/28/17 0443  WBC 11.1* 6.7  CREATININE 8.24* 8.03*  LATICACIDVEN 1.3  --     Estimated Creatinine Clearance: 15.6 mL/min (A) (by C-G formula based on SCr of 8.03 mg/dL (H)).    Allergies  Allergen Reactions  . Codeine Nausea And Vomiting    Antimicrobials this admission: Vancomycin 5/23 >> Zosyn 5/23 >>  Microbiology results: 5/23 BCx: ordered 5/24 Wound Cx: GPC, GPR, GNR  Thank you for allowing pharmacy to be a part of this patient's care.  Pernell Dupre, PharmD 03/01/2017 9:10 AM

## 2017-03-01 NOTE — Anesthesia Preprocedure Evaluation (Signed)
Anesthesia Evaluation  Patient identified by MRN, date of birth, ID band Patient awake    Reviewed: Allergy & Precautions, H&P , NPO status , Patient's Chart, lab work & pertinent test results, reviewed documented beta blocker date and time   Airway Mallampati: III   Neck ROM: full    Dental  (+) Poor Dentition   Pulmonary neg pulmonary ROS, sleep apnea ,    Pulmonary exam normal        Cardiovascular hypertension, On Medications negative cardio ROS Normal cardiovascular exam Rhythm:regular Rate:Normal     Neuro/Psych PSYCHIATRIC DISORDERS negative neurological ROS  negative psych ROS   GI/Hepatic negative GI ROS, Neg liver ROS, GERD  Medicated,  Endo/Other  negative endocrine ROSdiabetes, Well Controlled, Type 1, Insulin Dependent  Renal/GU ESRFnegative Renal ROS  negative genitourinary   Musculoskeletal   Abdominal   Peds  Hematology negative hematology ROS (+)   Anesthesia Other Findings Past Medical History: 03/01/2017: Cellulitis and abscess of right lower extremity No date: Chronic kidney disease No date: Diabetes mellitus without complication ([place]) 22/33/6122: Edema extremities     Comment: bilateral swelling No date: High cholesterol No date: High triglycerides No date: Hypertension No date: Sleep apnea     Comment: can't use cpap d/t feelings of suffocation Past Surgical History: 2010: APPENDECTOMY BMI    Body Mass Index:  49.35 kg/m     Reproductive/Obstetrics negative OB ROS                             Anesthesia Physical Anesthesia Plan  ASA: III  Anesthesia Plan: General and Spinal   Post-op Pain Management:    Induction:   Airway Management Planned:   Additional Equipment:   Intra-op Plan:   Post-operative Plan:   Informed Consent: I have reviewed the patients History and Physical, chart, labs and discussed the procedure including the risks, benefits  and alternatives for the proposed anesthesia with the patient or authorized representative who has indicated his/her understanding and acceptance.   Dental Advisory Given  Plan Discussed with: CRNA  Anesthesia Plan Comments:         Anesthesia Quick Evaluation

## 2017-03-01 NOTE — Op Note (Signed)
Operative note   Surgeon: Dr. Albertine Patricia, DPM.    Assistant: None    Preop diagnosis: Cellulitis abscess associated with diabetic wound right foot    Postop diagnosis: Same    Procedure:   1. Incision and drainage of abscess   2. Debridement of necrotic infected skin skin structures, subcutaneous tissue including fat and deep fascia. Wound measured 3 cm in length and 4 cm in width with 3-4 cm in depth.       EBL: 20 cc    Anesthesia:spinal    Hemostasis: None    Specimen: None. Deep wound culture has been sent yesterday    Complications: None    Operative indications: Infection with cellulitis abscess and pronounced dorsal wound to the right foot    Procedure:  Patient was brought into the OR and placed on the operating table in thesupine position. After anesthesia was obtained theright lower extremity was prepped and draped in usual sterile fashion.  Operative Report: At this time attention was directed to the dorsum of the right foot where a large moist necrotic patch was noted over the dorsum of the right foot centralized in the ulceration. A versa jet was used to debride and remove all the necrotic tissue from this area. The wound penetrated down through the fatty tissue down to almost the tendon and joint level but I think the tendons joints were spared from any infection or necrotic tissue. Hemostat and blunt probing was used to explore areas proximally there was no evident necrotic tissue. With further compression of the wound no purulent drainage was noted. All necrotic portions of tissue were removed from the region and any infected areas were cleaned debrided and removed. Once again the areas compressed and only good capillary bleeding was noted. No purulent drainage was noted this timeframe. The area been explored plantarly proximally medially and laterally and distally and appeared to be stable. At this time since the wound looked pretty clean and the cellulitis was  better than as compared to yesterday I elected also go and put a wound VAC on him. I'll likely changes on Sunday. The VAC was placed using a silver enhanced sponge. Some dressing was placed around the area for tissue protection including 4 x 4's Kerlix and an Ace wrap and also try to get some swelling out of his foot. Patient has severe lymphedema bilaterally.    Patient tolerated the procedure and anesthesia well.  Was transported from the OR to the PACU with all vital signs stable and vascular status intact. To be discharged per routine protocol.  Will follow up in approximately 1 week in the outpatient clinic.

## 2017-03-01 NOTE — Progress Notes (Signed)
HD COMPLETED  

## 2017-03-01 NOTE — Progress Notes (Signed)
[facility] [place] NAME: Steven [name]    MR#:  [id number]  DATE OF BIRTH:  [birth year]  SUBJECTIVE:   Patient came in with foul smelling ulcer and drainage with the right foot. Patient seen at hemodialysis. He is status post right foot incision and drainage with debridement of the abscess. Patient much sedated at present just transferred out of PACU now getting hemodialysis REVIEW OF SYSTEMS:   Review of Systems  Constitutional: Negative for chills, fever and weight loss.  HENT: Negative for ear discharge, ear pain and nosebleeds.   Eyes: Negative for blurred vision, pain and discharge.  Respiratory: Negative for sputum production, shortness of breath, wheezing and stridor.   Cardiovascular: Positive for leg swelling. Negative for chest pain, palpitations, orthopnea and PND.  Gastrointestinal: Negative for abdominal pain, diarrhea, nausea and vomiting.  Genitourinary: Negative for frequency and urgency.  Musculoskeletal: Negative for back pain and joint pain.  Neurological: Positive for weakness. Negative for sensory change, speech change and focal weakness.  Psychiatric/Behavioral: Negative for depression and hallucinations. The patient is not nervous/anxious.    Tolerating Diet:yes Tolerating PT: pending  DRUG ALLERGIES:   Allergies  Allergen Reactions  . Codeine Nausea And Vomiting    VITALS:  Blood pressure 123/67, pulse 97, temperature 98.3 F (36.8 C), temperature source Axillary, resp. rate 20, height 5\' 10"  (1.778 m), weight (!) 156 kg (343 lb 14.7 oz), SpO2 93 %.  PHYSICAL EXAMINATION:   Physical Exam  GENERAL:  56 y.o.-year-old patient lying in the bed with no acute distress. obese EYES: Pupils equal, round, reactive to light and accommodation. No scleral icterus. Extraocular muscles intact.  HEENT: Head atraumatic, normocephalic. Oropharynx and nasopharynx clear.  NECK:  Supple, no jugular venous distention.  No thyroid enlargement, no tenderness.  LUNGS: Normal breath sounds bilaterally, no wheezing, rales, rhonchi. No use of accessory muscles of respiration.  CARDIOVASCULAR: S1, S2 normal. No murmurs, rubs, or gallops.  ABDOMEN: Soft, nontender, nondistended. Bowel sounds present. No organomegaly or mass.  EXTREMITIES: Surgical dressing with wound VAC NEUROLOGIC: Cranial nerves II through XII are intact. No focal Motor or sensory deficits b/l.   PSYCHIATRIC:  patient is alert and verbal commands but sleepy secondary to recent sedation SKIN: No obvious rash, lesion, or ulcer.   LABORATORY PANEL:  CBC  Recent Labs Lab 03/01/17 1254  WBC 6.0  HGB 8.5*  HCT 25.2*  PLT 302    Chemistries   Recent Labs Lab 02/27/17 1718 02/28/17 0443 03/01/17 0446  NA 134* 135  --   K 5.3* 4.8 4.6  CL 99* 101  --   CO2 22 24  --   GLUCOSE 251* 291*  --   BUN 72* 71*  --   CREATININE 8.24* 8.03*  --   CALCIUM 8.6* 8.1*  --   AST 17  --   --   ALT 17  --   --   ALKPHOS 129*  --   --   BILITOT 0.3  --   --    Cardiac Enzymes No results for input(s): TROPONINI in the last 168 hours. RADIOLOGY:  Dg Chest 2 View  Result Date: 02/27/2017 CLINICAL DATA:  56 y/o  M; weakness. EXAM: CHEST  2 VIEW COMPARISON:  None. FINDINGS: The heart size and mediastinal contours are within normal limits. Both lungs are clear. The visualized skeletal structures are unremarkable. IMPRESSION: No active cardiopulmonary disease. Electronically Signed   By: Edgardo Roys.D.  On: 02/27/2017 19:21   Dg Elbow Complete Left  Result Date: 02/27/2017 CLINICAL DATA:  Left elbow pain.  Known left elbow fracture. EXAM: LEFT ELBOW - COMPLETE 3+ VIEW COMPARISON:  01/18/2017 and 02/05/2017 FINDINGS: Left elbow is in [name] fiberglass cast or splint. There appears to be increased impaction of the comminuted radial head fracture. Alignment of the elbow is abnormal with posterior subluxation of the elbow. Again noted is [name]  displaced fracture involving the distal humerus lateral epicondyle. There also appears to be [name] fracture along the medial aspect of the olecranon or possibly the coronoid process. IMPRESSION: Abnormal alignment of the left elbow with posterior subluxation. Increased displacement and impaction of the radial head fracture. There appears to be additional fractures involving the distal humeral lateral epicondyle and probably the olecranon or coronoid process. Electronically Signed   By: Markus Daft M.D.   On: 02/27/2017 19:31   Dg Foot Complete Right  Result Date: 02/27/2017 CLINICAL DATA:  Wound at the right third through fifth metatarsals, with severe edema. Initial encounter. EXAM: RIGHT FOOT COMPLETE - 3+ VIEW COMPARISON:  None. FINDINGS: There is no evidence of fracture or dislocation. The joint spaces are preserved. There is no evidence of talar subluxation; the subtalar joint is unremarkable in appearance. An os peroneum is noted. There is question of vague lucency at the medial aspects of the fourth and fifth metatarsal heads. Mild osteomyelitis cannot be excluded. Diffuse soft tissue edema is noted about the forefoot and dorsum of the midfoot. Scattered soft tissue air is noted tracking about the lateral aspect of the forefoot. Would correlate clinically to exclude necrotizing fasciitis. IMPRESSION: 1. No evidence of fracture or dislocation. 2. Os peroneum noted. 3. Question of vague lucency at the medial aspects of the fourth and fifth metatarsal heads. Mild osteomyelitis cannot be excluded. 4. Scattered soft tissue air tracking about the lateral aspect of the forefoot. Would correlate clinically to exclude necrotizing fasciitis These results were called by telephone at the time of interpretation on 02/27/2017 at 7:22 pm to Dr. Shirlyn Goltz, who verbally acknowledged these results. Electronically Signed   By: Garald Balding M.D.   On: 02/27/2017 19:24   ASSESSMENT AND PLAN:   Steven [name]  is [name] 56 y.o.  male with [name] known history of Chronic kidney disease, diabetes, high cholesterol, hypertension- had [name] fall 6 weeks ago and broke his elbow, went to his primary care doctor's office, x-rays showed Comminuted fracture and was advised to go to [name] tertiary care center and special. Steven Allegra.  * Right diabetic foot abscess/Cellulitis/ulcer  With foul smelling ulcers and severe edema on right foot, possible osteomyelitis - IV vancomycin and Zosyn -Podiatry consult appreciated. Patient is status post I and D of abscess and debridement -Infectious disease consultation  * Acute on chronic renal failure, CKD stage V--- now ESRD - Nephrology consulted.  -Patient started on hemodialysis this hospitalization - hold nephrotoxic medication. -Temporary cath to be placed on May 24 by Dr Lucky Cowboy  * Hypertension Amlodipine and hydralazine  * Diabetes--poorly uncontrolled   Insulin sliding scale coverage and Lantus. Seen by Diabetes coordinator Pt has very poorly controlled DM  * Anemia   Likely due to chronic kidney disease.  Case discussed with Care Management/Social Worker. Management plans discussed with the patient, family and they are in agreement.  CODE STATUS: FULL  DVT Prophylaxis: Heparin  TOTAL TIME TAKING CARE OF THIS PATIENT: *30* minutes.  >50% time spent on counselling and coordination of care  POSSIBLE D/C IN 3-4DAYS, DEPENDING ON CLINICAL CONDITION.  Note: This dictation was prepared with Dragon dictation along with smaller phrase technology. Any transcriptional errors that result from this process are unintentional.  Bon Dowis M.D on 03/01/2017 at 1:15 PM  Between 7am to 6pm - Pager - [phone]  After 6pm go to [email or web address] - password [email or web address] [facility]  Office  [phone]  CC: Primary care physician; [name], MD

## 2017-03-01 NOTE — Anesthesia Post-op Follow-up Note (Cosign Needed)
Anesthesia QCDR form completed.        

## 2017-03-01 NOTE — Transfer of Care (Signed)
Immediate Anesthesia Transfer of Care Note  Patient: Steven [name]  Procedure(s) Performed: Procedure(s) with comments: IRRIGATION AND DEBRIDEMENT FOOT (Right) - application of wound vac  Patient Location: PACU  Anesthesia Type:Spinal  Level of Consciousness: awake, alert , oriented and patient cooperative  Airway & Oxygen Therapy: Patient Spontanous Breathing and Patient connected to face mask oxygen  Post-op Assessment: Report given to RN and Post -op Vital signs reviewed and stable  Post vital signs: Reviewed and stable  Last Vitals:  Vitals:   03/01/17 0814 03/01/17 0910  BP: (!) 155/81 138/69  Pulse: (!) 105 (!) 110  Resp: 18 17  Temp: 37 C 36.9 C    Last Pain:  Vitals:   03/01/17 0910  TempSrc: Tympanic  PainSc:       Patients Stated Pain Goal: 0 (89/38/10 1751)  Complications: No apparent anesthesia complications

## 2017-03-01 NOTE — Progress Notes (Signed)
OT Cancellation Note  Patient Details Name: Steven [name] MRN: [id number] DOB: 11/06/1960   Cancelled Treatment:    Reason Eval/Treat Not Completed: Medical issues which prohibited therapy (Pt. was having surgical debridement surgery in a.m. Pt. was at Dialysis in p.m.  )  Harrel Carina, MS, OTR/L 03/01/2017, 3:22 PM

## 2017-03-01 NOTE — H&P (Signed)
H and P has been reviewed and no changes are noted.  

## 2017-03-01 NOTE — Progress Notes (Signed)
Post dialysis assessment 

## 2017-03-01 NOTE — Progress Notes (Signed)
Inpatient Diabetes Program Recommendations  AACE/ADA: New Consensus Statement on Inpatient Glycemic Control (2015)  Target Ranges:  Prepandial:   less than 140 mg/dL      Peak postprandial:   less than 180 mg/dL (1-2 hours)      Critically ill patients:  140 - 180 mg/dL   Lab Results  Component Value Date   GLUCAP 311 (H) 02/28/2017   HGBA1C 8.2 (H) 02/28/2017    Review of Glycemic Control   Results for BODIN, GORKA (MRN [id number]) as of 03/01/2017 08:01  Ref. Range 02/28/2017 07:49 02/28/2017 11:49 02/28/2017 18:43 02/28/2017 21:18 03/01/2017 07:35  Glucose-Capillary Latest Ref Range: 65 - 99 mg/dL 250 (H) 276 (H) 243 (H) 311 (H) 208 (H)    Diabetes history: A1C 8.2%  Outpatient Diabetes medications: Novolog mix 70/30 sliding scale 40-150 units bid- self adjusting confirmed with patient  Note from Dr. Netty Starring dates 06/05/17 state patient should be taking 80 units bid  Current orders for Inpatient glycemic control: Lantus 30 units qday (will begin today), Novolog 0-9 units tid  Inpatient Diabetes Program Recommendations:    Consider adding Novolog 7 units tid (hold if he eats less than 50%).  Gentry Fitz, RN, BA, MHA, CDE Diabetes Coordinator Inpatient Diabetes Program  [phone] (Team Pager) [phone] ([place]) 03/01/2017 8:03 AM

## 2017-03-02 ENCOUNTER — Encounter: Payer: Self-pay | Admitting: Podiatry

## 2017-03-02 LAB — RENAL FUNCTION PANEL
Albumin: 2.2 g/dL — ABNORMAL LOW (ref 3.5–5.0)
Anion gap: 9 (ref 5–15)
BUN: 37 mg/dL — AB (ref 6–20)
CHLORIDE: 101 mmol/L (ref 101–111)
CO2: 29 mmol/L (ref 22–32)
Calcium: 8 mg/dL — ABNORMAL LOW (ref 8.9–10.3)
Creatinine, Ser: 5.73 mg/dL — ABNORMAL HIGH (ref 0.61–1.24)
GFR, EST AFRICAN AMERICAN: 12 mL/min — AB (ref >60)
GFR, EST NON AFRICAN AMERICAN: 10 mL/min — AB (ref >60)
Glucose, Bld: 231 mg/dL — ABNORMAL HIGH (ref 65–99)
POTASSIUM: 4.1 mmol/L (ref 3.5–5.1)
Phosphorus: 5.1 mg/dL — ABNORMAL HIGH (ref 2.5–4.6)
Sodium: 139 mmol/L (ref 135–145)

## 2017-03-02 LAB — GLUCOSE, CAPILLARY
GLUCOSE-CAPILLARY: 194 mg/dL — AB (ref 65–99)
GLUCOSE-CAPILLARY: 107 mg/dL — AB (ref 65–99)
GLUCOSE-CAPILLARY: 186 mg/dL — AB (ref 65–99)
Glucose-Capillary: 220 mg/dL — ABNORMAL HIGH (ref 65–99)

## 2017-03-02 LAB — CBC
HEMATOCRIT: 26.2 % — AB (ref 40.0–52.0)
HEMOGLOBIN: 8.8 g/dL — AB (ref 13.0–18.0)
MCH: 28.8 pg (ref 26.0–34.0)
MCHC: 33.7 g/dL (ref 32.0–36.0)
MCV: 85.3 fL (ref 80.0–100.0)
Platelets: 315 10*3/uL (ref 150–440)
RBC: 3.07 MIL/uL — AB (ref 4.40–5.90)
RDW: 13.4 % (ref 11.5–14.5)
WBC: 8.3 10*3/uL (ref 3.8–10.6)

## 2017-03-02 LAB — VANCOMYCIN, RANDOM: Vancomycin Rm: 16

## 2017-03-02 MED ORDER — GABAPENTIN 300 MG PO CAPS
300.0000 mg | ORAL_CAPSULE | Freq: Two times a day (BID) | ORAL | Status: DC
  Administered 2017-03-02 – 2017-03-07 (×10): 300 mg via ORAL
  Filled 2017-03-02 (×10): qty 1

## 2017-03-02 NOTE — Progress Notes (Signed)
Hd started  

## 2017-03-02 NOTE — Progress Notes (Signed)
PRE DIALYSIS ASSESSMENT 

## 2017-03-02 NOTE — Progress Notes (Signed)
Patient Demographics  Steven [name], is a 56 y.o. male   MRN: [id number]   DOB - 1960-11-21  Admit Date - 02/27/2017    Outpatient Primary MD for the patient is [name], MD  Consult requested in the Hospital by Fritzi Mandes, MD, On 03/02/2017     With History of -  Past Medical History:  Diagnosis Date  . Cellulitis and abscess of right lower extremity 03/01/2017  . Chronic kidney disease   . Diabetes mellitus without complication ([place])   . Edema extremities 03/01/2017   bilateral swelling  . High cholesterol   . High triglycerides   . Hypertension   . Sleep apnea    can't use cpap d/t feelings of suffocation      Past Surgical History:  Procedure Laterality Date  . APPENDECTOMY  2010  . IRRIGATION AND DEBRIDEMENT FOOT Right 03/01/2017   Procedure: IRRIGATION AND DEBRIDEMENT FOOT;  Surgeon: Albertine Patricia, DPM;  Location: [facility] ORS;  Service: Podiatry;  Laterality: Right;  application of wound vac    in for   Chief Complaint  Patient presents with  . Wound Check  . Leg Swelling  . Elbow Injury     HPI  Steven [name]  is a 56 y.o. male, Underwent surgery yesterday for debridement of a deep infected wound of dorsum of the right foot wound vacs placed on and is doing well with.    Review of Systems  : Patient has morbid obesity but is alert and well-oriented day no. Distress.  In addition to the HPI above,  No Fever-chills, No Headache, No changes with Vision or hearing, No problems swallowing food or Liquids, No Chest pain, Cough or Shortness of Breath, No Abdominal pain, No Nausea or Vommitting, Bowel movements are regular, No Blood in stool or Urine, No dysuria, No new skin rashes or bruises, No new joints pains-aches,  No new weakness, tingling, numbness in any  extremity, No polyuria, polydypsia or polyphagia, A full 10 point Review of Systems was done, except as stated above, all other Review of Systems were negative.   Social History Social History  Substance Use Topics  . Smoking status: Never Smoker  . Smokeless tobacco: Never Used  . Alcohol use Yes     Comment: up until 01/2017 was heavy drinker...4 40 oz qd     Family History Family History  Problem Relation Age of Onset  . CAD Father      Anti-infectives    Start     Dose/Rate Route Frequency Ordered Stop   03/01/17 0000  vancomycin (VANCOCIN) IVPB 1000 mg/200 mL premix  Status:  Discontinued     1,000 mg 200 mL/hr over 60 Minutes Intravenous As needed 02/28/17 1035 02/28/17 1232   02/28/17 1232  vancomycin (VANCOCIN) IVPB 1000 mg/200 mL premix     1,000 mg 200 mL/hr over 60 Minutes Intravenous Every Dialysis 02/28/17 1232     02/28/17 1000  piperacillin-tazobactam (ZOSYN) IVPB 3.375 g     3.375 g 12.5 mL/hr over 240 Minutes Intravenous Every 12 hours 02/27/17 1857     02/27/17 1900  piperacillin-tazobactam (ZOSYN) IVPB 3.375 g     3.375 g 100  mL/hr over 30 Minutes Intravenous  Once 02/27/17 1848 02/27/17 2309   02/27/17 1845  vancomycin (VANCOCIN) 2,000 mg in sodium chloride 0.9 % 500 mL IVPB     2,000 mg 250 mL/hr over 120 Minutes Intravenous  Once 02/27/17 1848 02/27/17 2235      Scheduled Meds: . amLODipine  10 mg Oral Daily  . aspirin EC  81 mg Oral Daily  . Chlorhexidine Gluconate Cloth  6 each Topical Daily  . heparin  5,000 Units Subcutaneous Q8H  . hydrALAZINE  50 mg Oral Q8H  . insulin aspart  0-15 Units Subcutaneous TID WC  . insulin glargine  30 Units Subcutaneous Daily  . living well with diabetes book   Does not apply Once  . mupirocin ointment  1 application Nasal BID  . tuberculin  5 Units Intradermal Once   Continuous Infusions: . piperacillin-tazobactam (ZOSYN)  IV Stopped (03/02/17 0414)  . vancomycin 1,000 mg (03/01/17 1405)   PRN  Meds:.docusate sodium, oxyCODONE-acetaminophen, vancomycin  Allergies  Allergen Reactions  . Codeine Nausea And Vomiting  Vitals  Blood pressure (!) 124/58, pulse 96, temperature 97.9 F (36.6 C), temperature source Oral, resp. rate 20, height 5\' 10"  (1.778 m), weight (!) 158 kg (348 lb 5.2 oz), SpO2 100 %.  Lower Extremity exam:Patient is resting in bed comfortably wound VAC is functional. No apparent distress no irritation or pain with foot.  Data Review  CBC  Recent Labs Lab 02/27/17 1718 02/28/17 0443 03/01/17 1254  WBC 11.1* 6.7 6.0  HGB 9.4* 9.4* 8.5*  HCT 28.1* 27.7* 25.2*  PLT 337 332 302  MCV 86.1 86.5 86.5  MCH 28.9 29.3 29.3  MCHC 33.5 33.9 33.9  RDW 13.1 13.4 13.3  LYMPHSABS 0.7*  --   --   MONOABS 0.9  --   --   EOSABS 0.2  --   --   BASOSABS 0.1  --   --    ------------------------------------------------------------------------------------------------------------------  Chemistries   Recent Labs Lab 02/27/17 1718 02/28/17 0443 03/01/17 0446 03/01/17 1254  NA 134* 135  --  138  K 5.3* 4.8 4.6 4.0  CL 99* 101  --  102  CO2 22 24  --  28  GLUCOSE 251* 291*  --  190*  BUN 72* 71*  --  44*  CREATININE 8.24* 8.03*  --  6.03*  CALCIUM 8.6* 8.1*  --  7.8*  AST 17  --   --   --   ALT 17  --   --   --   ALKPHOS 129*  --   --   --   BILITOT 0.3  --   --   --    ---------------------- Assessment & Plan: Overall the patient did well with surgery yesterday the wound was not tracking proximally it seemed to be isolated to the central area of the wound itself. Was however fairly large and deep. Wound VAC on her try to promote healing and granulation to the wound. I will take that off changed tomorrow. Recommend he continue the IV antibiotics. Culture so gram-positive cocci and gram-negative rods. Dr. Ola Spurr has been consulted.   Thank you for the consult, we will follow the patient.   Perry Mount M.D on 03/02/2017 at 7:50 AM

## 2017-03-02 NOTE — Progress Notes (Signed)
HD COMPLETED  

## 2017-03-02 NOTE — Evaluation (Signed)
Occupational Therapy Evaluation Patient Details Name: Steven [name] MRN: [id number] DOB: 02/25/1961 Today's Date: 03/02/2017    History of Present Illness Pt. is a 56 y.o. male who was admitted to [facility] with Cellulitis, and abscess of right LE, CKD, DM, Edema bilateral LE's High Cholesterol, HTN, Sleep Apnea, and new onset of ESRD. Pt. currently has a femoral cath for Dialysis in place. Approximately 6 months ago, Pt. sustained a Left elbow Fracture, Displacement of the Left radial head, subluxation of the elbow.   Clinical Impression   Pt. Is a 56 y.o. Male who was admitted to [facility] with cellulitis, and abscess in the right LE. Pt. has a new onset of ESRD, and has a temporary femoral catheter in place. Pt. Has orders for gentle ROM to the Left elbow from Dr. Lisette Grinder. Pt. Has increased edema in the Left elbow, forearm, wrist, and hand. Pt. Tolerated gentle ROM for left elbow flexion, and extension with no pain through ROM, 3/10 at the end ranges. Pt. Pain at end of session 1/10. Pt. education was provided about performing gentle ROM to the elbow. Pt. Education was provided about edema control techniques/positioning of the LUE. Pt. Could benefit from continued OT services upon discharge.     Follow Up Recommendations  Home health OT    Equipment Recommendations       Recommendations for Other Services       Precautions / Restrictions  Temporary Femoral Cath                                                   ADL either performed or assessed with clinical judgement   ADL Overall ADL's : Needs assistance/impaired Eating/Feeding: Set up   Grooming: Set up;Minimal assistance   Upper Body Bathing: Moderate assistance   Lower Body Bathing: Maximal assistance   Upper Body Dressing : Moderate assistance   Lower Body Dressing: Maximal assistance               Functional mobility during ADLs:  (deferred secondary to temporary femoral cath in place for  Dialysis.)       Vision Patient Visual Report: No change from baseline       Perception     Praxis      Pertinent Vitals/Pain Pain Assessment: 0-10 Pain Score: 3  Pain Location: bilateral feet. Pain Descriptors / Indicators: Aching     Hand Dominance Right   Extremity/Trunk Assessment Upper Extremity Assessment Upper Extremity Assessment: LUE deficits/detail LUE Deficits / Details: Pt. has increaed edema in the Left elbow, and hand. Limited ROM in left Elbow.           Communication Communication Communication: No difficulties   Cognition Arousal/Alertness: Awake/alert Behavior During Therapy: WFL for tasks assessed/performed Overall Cognitive Status: Within Functional Limits for tasks assessed                                     General Comments       Exercises     Shoulder Instructions      Home Living Family/patient expects to be discharged to:: Private residence Living Arrangements: Alone Available Help at Discharge: Family Type of Home: Mobile home Home Access: Ramped entrance (Ramp in front entrance, 4 steps in back.)  Home Layout: One level     Bathroom Shower/Tub: Tub/shower unit;Curtain         Home Equipment: Hand held shower head          Prior Functioning/Environment Level of Independence: Independent    ADL's / Homemaking Assistance Needed: Independent with ADLs, and IADLs, driving, worked as an Research scientist (life sciences).            OT Problem List: Decreased strength;Pain;Impaired UE functional use;Decreased activity tolerance;Impaired balance (sitting and/or standing);Decreased range of motion      OT Treatment/Interventions: Self-care/ADL training;Therapeutic exercise;Energy conservation;DME and/or AE instruction;Patient/family education;Therapeutic activities    OT Goals(Current goals can be found in the care plan section)    OT Frequency: Min 1X/week   Barriers to D/C:            Co-evaluation               AM-PAC PT "6 Clicks" Daily Activity     Outcome Measure Help from another person eating meals?: None Help from another person taking care of personal grooming?: A Little Help from another person toileting, which includes using toliet, bedpan, or urinal?: A Lot Help from another person bathing (including washing, rinsing, drying)?: A Lot Help from another person to put on and taking off regular upper body clothing?: A Little Help from another person to put on and taking off regular lower body clothing?: A Lot 6 Click Score: 16   End of Session    Activity Tolerance: Patient tolerated treatment well Patient left: in bed;with call bell/phone within reach;with bed alarm set;with chair alarm set  OT Visit Diagnosis: Muscle weakness (generalized) (M62.81)                Time: 1300-1330 OT Time Calculation (min): 30 min Charges:  OT General Charges $OT Visit: 1 Procedure OT Evaluation $OT Eval Moderate Complexity: 1 Procedure G-Codes:     Harrel Carina, MS, OTR/L   Harrel Carina, MS, OTR/L 03/02/2017, 1:47 PM

## 2017-03-02 NOTE — Progress Notes (Signed)
Pharmacy Antibiotic Note  Steven [name] is a 56 y.o. male admitted on 02/27/2017 with cellulitis.  Pharmacy has been consulted for vancomycin and piperacillin-tazobactam dosing.  Plan: Continue Vanc 1g to be given with each HD. Will plan for random vanc level to be drawn prior to 3rd HD session.   Continue  piperacillin-tazobactam 3.375g IV Q12h (4 hour infusion).   5/26:  Patient to receive 3rd HD treatment today. Vancomycin level pre-HD= 16 mcg/ml (goal 15-25 mcg/ml). Will continue current Vancomycin regimen. F/u Vancomycin level prior to the next 3rd HD session-depending on HD schedule.   Height: 5\' 10"  (177.8 cm) Weight: (!) 348 lb 5.2 oz (158 kg) IBW/kg (Calculated) : 73  Temp (24hrs), Avg:98.3 F (36.8 C), Min:97.9 F (36.6 C), Max:98.8 F (37.1 C)   Recent Labs Lab 02/27/17 1718 02/28/17 0443 03/01/17 1254 03/02/17 0845 03/02/17 1143  WBC 11.1* 6.7 6.0 8.3  --   CREATININE 8.24* 8.03* 6.03* 5.73*  --   LATICACIDVEN 1.3  --   --   --   --   VANCORANDOM  --   --   --   --  16    Estimated Creatinine Clearance: 22 mL/min (A) (by C-G formula based on SCr of 5.73 mg/dL (H)).    Allergies  Allergen Reactions  . Codeine Nausea And Vomiting    Antimicrobials this admission: Vancomycin 5/23 >> Zosyn 5/23 >>  Microbiology results: 5/23 BCx: ordered 5/24 Wound Cx: GPC, GPR, GNR  Thank you for allowing pharmacy to be a part of this patient's care.  Noralee Dutko A, PharmD 03/02/2017 1:28 PM

## 2017-03-02 NOTE — Progress Notes (Signed)
[facility]  ROUNDING NOTE   Subjective:   Second hemodialysis treatment yesterday. Tolerated treatment well. Third treatment for later today.   I&D by Dr. Elvina Mattes 5/25. Now with wound vac.   Objective:  Vital signs in last 24 hours:  Temp:  [97.9 F (36.6 C)-98.8 F (37.1 C)] 97.9 F (36.6 C) (05/26 0533) Pulse Rate:  [95-104] 96 (05/26 0533) Resp:  [11-20] 20 (05/26 0533) BP: (118-165)/(58-93) 124/58 (05/26 0533) SpO2:  [90 %-100 %] 100 % (05/26 0533) Weight:  [158 kg (348 lb 5.2 oz)-159.2 kg (350 lb 15.6 oz)] 158 kg (348 lb 5.2 oz) (05/25 1505)  Weight change: 3.2 kg (7 lb 0.9 oz) Filed Weights   02/28/17 1717 03/01/17 1230 03/01/17 1505  Weight: (!) 156 kg (343 lb 14.7 oz) (!) 159.2 kg (350 lb 15.6 oz) (!) 158 kg (348 lb 5.2 oz)    Intake/Output: I/O last 3 completed shifts: In: 9528 [P.O.:838; I.V.:100; IV Piggyback:100] Out: 2360 [Urine:1850; Other:500; Blood:10]   Intake/Output this shift:  No intake/output data recorded.  Physical Exam: General: NAD, laying in bed  Head: Normocephalic, atraumatic. Moist oral mucosal membranes  Eyes: Anicteric, PERRL  Neck: Supple, trachea midline  Lungs:  Clear to auscultation  Heart: Regular rate and rhythm  Abdomen:  Soft, nontender, obese  Extremities: + peripheral edema, right foot wound vac, clean dry intact dressings  Neurologic: Nonfocal, moving all four extremities  Skin: No lesions  Access: Right femoral temp HD catheter Dr. Lucky Cowboy 4/13    Basic Metabolic Panel:  Recent Labs Lab 02/27/17 1718 02/28/17 0443 02/28/17 1030 03/01/17 0446 03/01/17 1254 03/02/17 0845  NA 134* 135  --   --  138 139  K 5.3* 4.8  --  4.6 4.0 4.1  CL 99* 101  --   --  102 101  CO2 22 24  --   --  28 29  GLUCOSE 251* 291*  --   --  190* 231*  BUN 72* 71*  --   --  44* 37*  CREATININE 8.24* 8.03*  --   --  6.03* 5.73*  CALCIUM 8.6* 8.1*  --   --  7.8* 8.0*  PHOS  --   --  6.4*  --  5.4* 5.1*    Liver Function  Tests:  Recent Labs Lab 02/27/17 1718 03/01/17 1254 03/02/17 0845  AST 17  --   --   ALT 17  --   --   ALKPHOS 129*  --   --   BILITOT 0.3  --   --   PROT 7.6  --   --   ALBUMIN 2.6* 2.1* 2.2*   No results for input(s): LIPASE, AMYLASE in the last 168 hours. No results for input(s): AMMONIA in the last 168 hours.  CBC:  Recent Labs Lab 02/27/17 1718 02/28/17 0443 03/01/17 1254 03/02/17 0845  WBC 11.1* 6.7 6.0 8.3  NEUTROABS 9.2*  --   --   --   HGB 9.4* 9.4* 8.5* 8.8*  HCT 28.1* 27.7* 25.2* 26.2*  MCV 86.1 86.5 86.5 85.3  PLT 337 332 302 315    Cardiac Enzymes: No results for input(s): CKTOTAL, CKMB, CKMBINDEX, TROPONINI in the last 168 hours.  BNP: Invalid input(s): POCBNP  CBG:  Recent Labs Lab 03/01/17 1051 03/01/17 1159 03/01/17 1628 03/01/17 2128 03/02/17 0746  GLUCAP 201* 196* 137* 236* 194*    Microbiology: Results for orders placed or performed during the hospital encounter of 02/27/17  Blood culture (routine x  2)     Status: None (Preliminary result)   Collection Time: 02/27/17  7:09 PM  Result Value Ref Range Status   Specimen Description BLOOD RIGHT HAND  Final   Special Requests Blood Culture adequate volume  Final   Culture NO GROWTH 3 DAYS  Final   Report Status PENDING  Incomplete  Blood culture (routine x 2)     Status: None (Preliminary result)   Collection Time: 02/27/17  7:09 PM  Result Value Ref Range Status   Specimen Description BLOOD RIGHT AC  Final   Special Requests Blood Culture adequate volume  Final   Culture NO GROWTH 3 DAYS  Final   Report Status PENDING  Incomplete  Aerobic/Anaerobic Culture (surgical/deep wound)     Status: None (Preliminary result)   Collection Time: 02/28/17  3:30 PM  Result Value Ref Range Status   Specimen Description FOOT  Final   Special Requests Immunocompromised  Final   Gram Stain   Final    MODERATE WBC PRESENT,BOTH PMN AND MONONUCLEAR ABUNDANT GRAM NEGATIVE RODS ABUNDANT GRAM  POSITIVE COCCI FEW GRAM POSITIVE RODS    Culture   Final    CULTURE REINCUBATED FOR BETTER GROWTH Performed at [facility], [place] [address], [place], [place] [address]    Report Status PENDING  Incomplete  Surgical PCR screen     Status: Abnormal   Collection Time: 02/28/17  5:00 PM  Result Value Ref Range Status   MRSA, PCR NEGATIVE NEGATIVE Final   Staphylococcus aureus POSITIVE (A) NEGATIVE Final    Comment:        The Xpert SA Assay (FDA approved for NASAL specimens in patients over 16 years of age), is one component of a comprehensive surveillance program.  Test performance has been validated by [facility] for patients greater than or equal to 28 year old. It is not intended to diagnose infection nor to guide or monitor treatment.     Coagulation Studies: No results for input(s): LABPROT, INR in the last 72 hours.  Urinalysis:  Recent Labs  02/27/17 1718  COLORURINE YELLOW*  LABSPEC 1.012  PHURINE 5.0  GLUCOSEU >=500*  HGBUR SMALL*  BILIRUBINUR NEGATIVE  KETONESUR NEGATIVE  PROTEINUR >=300*  NITRITE NEGATIVE  LEUKOCYTESUR NEGATIVE      Imaging: No results found.   Medications:   . piperacillin-tazobactam (ZOSYN)  IV Stopped (03/02/17 0414)  . vancomycin 1,000 mg (03/01/17 1405)   . amLODipine  10 mg Oral Daily  . aspirin EC  81 mg Oral Daily  . Chlorhexidine Gluconate Cloth  6 each Topical Daily  . heparin  5,000 Units Subcutaneous Q8H  . hydrALAZINE  50 mg Oral Q8H  . insulin aspart  0-15 Units Subcutaneous TID WC  . insulin glargine  30 Units Subcutaneous Daily  . living well with diabetes book   Does not apply Once  . mupirocin ointment  1 application Nasal BID  . tuberculin  5 Units Intradermal Once   docusate sodium, oxyCODONE-acetaminophen, vancomycin  Assessment/ Plan:  [name] is a 56 y.o. white male  with hypertension, diabetes mellitus type 2 insulin dependent, hyperlipidemia, GERD, and morbid obesity  1. End  Stage Renal Disease: secondary to diabetic nephropathy, hypertensive nephropathy Nephrotic range proteinuria. No biopsy.  Hyperkalemia on admission Initiated on hemodialysis on this admission. First dialysis was 5/24. Tolerated treatment well.  - Third treatment for today. Orders prepared.  - PPD to be read on today, 5/26.  - Will need  permcath before discharge.   2. Hypertension: blood pressure at goal. Currently holding lisinopril.  - amlodipine, hydralazine  3. Anemia of chronic kidney disease: hemoglobin 8.8. Normocytic - EPO with treatment today.   4. Secondary Hyperparathyroidism: calcium at goal. Not currently on vitamin D. PTH 121. Phosphorus 5.4 - at goal. No currently on phosphate binders.   5. Diabetes mellitus type II with chronic kidney disease: hemoglobin J8J 1.9% complicated with with diabetic foot ulcer right foot status post I&D 5/25 Dr. Elvina Mattes. Wound vac - Appreciate ID and podiatry input.  - Continue glucose control.  - empiric antibiotic zosyn and vanco  6. Left olecrenon fracture: working with OT - Appreciate ortho input. Patient will need to see an elbow specialist.     LOS: 3 Steven [name] 5/26/201810:02 AM

## 2017-03-02 NOTE — Progress Notes (Signed)
[place] [facility] [place] NAME: Steven [name]    MR#:  [id number]  DATE OF BIRTH:  1961/09/21  SUBJECTIVE:   Patient came in with foul smelling ulcer and drainage with the right foot. Patient seen at hemodialysis. He is status post right foot incision and drainage with debridement of the abscess. REVIEW OF SYSTEMS:   Review of Systems  Constitutional: Negative for chills, fever and weight loss.  HENT: Negative for ear discharge, ear pain and nosebleeds.   Eyes: Negative for blurred vision, pain and discharge.  Respiratory: Negative for sputum production, shortness of breath, wheezing and stridor.   Cardiovascular: Positive for leg swelling. Negative for chest pain, palpitations, orthopnea and PND.  Gastrointestinal: Negative for abdominal pain, diarrhea, nausea and vomiting.  Genitourinary: Negative for frequency and urgency.  Musculoskeletal: Negative for back pain and joint pain.  Neurological: Positive for weakness. Negative for sensory change, speech change and focal weakness.  Psychiatric/Behavioral: Negative for depression and hallucinations. The patient is not nervous/anxious.    Tolerating Diet:yes Tolerating PT: pending  DRUG ALLERGIES:   Allergies  Allergen Reactions  . Codeine Nausea And Vomiting    VITALS:  Blood pressure 132/64, pulse 91, temperature 99 F (37.2 C), temperature source Oral, resp. rate 17, height 5\' 10"  (1.778 m), weight (!) 159.3 kg (351 lb 3.1 oz), SpO2 94 %.  PHYSICAL EXAMINATION:   Physical Exam  GENERAL:  56 y.o.-year-old patient lying in the bed with no acute distress. obese EYES: Pupils equal, round, reactive to light and accommodation. No scleral icterus. Extraocular muscles intact.  HEENT: Head atraumatic, normocephalic. Oropharynx and nasopharynx clear.  NECK:  Supple, no jugular venous distention. No thyroid enlargement, no tenderness.  LUNGS: Normal breath sounds bilaterally, no  wheezing, rales, rhonchi. No use of accessory muscles of respiration.  CARDIOVASCULAR: S1, S2 normal. No murmurs, rubs, or gallops.  ABDOMEN: Soft, nontender, nondistended. Bowel sounds present. No organomegaly or mass.  EXTREMITIES: Surgical dressing with wound VAC NEUROLOGIC: Cranial nerves II through XII are intact. No focal Motor or sensory deficits b/l.   PSYCHIATRIC:  patient is alert and verbal commands but sleepy secondary to recent sedation SKIN: No obvious rash, lesion, or ulcer.   LABORATORY PANEL:  CBC  Recent Labs Lab 03/02/17 0845  WBC 8.3  HGB 8.8*  HCT 26.2*  PLT 315    Chemistries   Recent Labs Lab 02/27/17 1718  03/02/17 0845  NA 134*  < > 139  K 5.3*  < > 4.1  CL 99*  < > 101  CO2 22  < > 29  GLUCOSE 251*  < > 231*  BUN 72*  < > 37*  CREATININE 8.24*  < > 5.73*  CALCIUM 8.6*  < > 8.0*  AST 17  --   --   ALT 17  --   --   ALKPHOS 129*  --   --   BILITOT 0.3  --   --   < > = values in this interval not displayed. Cardiac Enzymes No results for input(s): TROPONINI in the last 168 hours. RADIOLOGY:  No results found. ASSESSMENT AND PLAN:   Steven [name]  is a 56 y.o. male with a known history of Chronic kidney disease, diabetes, high cholesterol, hypertension- had a fall 6 weeks ago and broke his elbow, went to his primary care doctor's office, x-rays showed Comminuted fracture and was advised to go to a tertiary care center and special. Steven [name].  *  Right diabetic foot abscess/Cellulitis/ulcer  With foul smelling ulcers and severe edema on right foot, possible osteomyelitis - IV vancomycin and Zosyn -Podiatry consult appreciated. Patient is status post I and D of abscess and debridement -Infectious disease consultation  * Acute on chronic renal failure, CKD stage V--- now ESRD - Nephrology consulted.  -Patient started on hemodialysis this hospitalization - hold nephrotoxic medication. -Temporary cath to be placed on May 24 by Dr Lucky Cowboy  *  Hypertension Amlodipine and hydralazine  * Diabetes--poorly uncontrolled   Insulin sliding scale coverage and Lantus. Seen by Diabetes coordinator Pt has very poorly controlled DM  * Anemia   Likely due to chronic kidney disease.  Case discussed with Care Management/Social Worker. Management plans discussed with the patient, family and they are in agreement.  CODE STATUS: FULL  DVT Prophylaxis: Heparin  TOTAL TIME TAKING CARE OF THIS PATIENT: *30* minutes.  >50% time spent on counselling and coordination of care  POSSIBLE D/C IN 3-4DAYS, DEPENDING ON CLINICAL CONDITION.  Note: This dictation was prepared with Dragon dictation along with smaller phrase technology. Any transcriptional errors that result from this process are unintentional.  Amberia Bayless M.D on 03/02/2017 at 2:46 PM  Between 7am to 6pm - Pager - [phone]  After 6pm go to [email or web address] - password [email or web address] [facility]  Office  [phone]  CC: Primary care physician; [name], MD

## 2017-03-02 NOTE — Progress Notes (Signed)
POST DIALYSIS ASSESSMENT 

## 2017-03-03 ENCOUNTER — Inpatient Hospital Stay
Admit: 2017-03-03 | Discharge: 2017-03-03 | Disposition: A | Discharge status: Home / Self Care | Payer: Medicaid Other | Attending: Internal Medicine | Admitting: Internal Medicine

## 2017-03-03 DIAGNOSIS — K219 Gastro-esophageal reflux disease without esophagitis: Secondary | ICD-10-CM

## 2017-03-03 LAB — GLUCOSE, CAPILLARY
GLUCOSE-CAPILLARY: 105 mg/dL — AB (ref 65–99)
GLUCOSE-CAPILLARY: 154 mg/dL — AB (ref 65–99)
Glucose-Capillary: 163 mg/dL — ABNORMAL HIGH (ref 65–99)
Glucose-Capillary: 124 mg/dL — ABNORMAL HIGH (ref 65–99)

## 2017-03-03 LAB — ECHOCARDIOGRAM COMPLETE
Height: 70 in
Weight: 5488.57 oz

## 2017-03-03 MED ORDER — PERFLUTREN LIPID MICROSPHERE
1.0000 mL | INTRAVENOUS | Status: AC | PRN
  Administered 2017-03-03: 3.5 mL via INTRAVENOUS
  Filled 2017-03-03: qty 10

## 2017-03-03 MED ORDER — METOPROLOL TARTRATE 25 MG PO TABS
25.0000 mg | ORAL_TABLET | Freq: Two times a day (BID) | ORAL | Status: DC
  Administered 2017-03-03 – 2017-03-07 (×7): 25 mg via ORAL
  Filled 2017-03-03 (×7): qty 1

## 2017-03-03 NOTE — Progress Notes (Signed)
*  PRELIMINARY RESULTS* Echocardiogram 2D Echocardiogram has been performed. Definity Contrast used on this exam.  Steven [name] Abril Cappiello 03/03/2017, 12:04 PM

## 2017-03-03 NOTE — Progress Notes (Signed)
Pharmacy Antibiotic Note  Steven [name] is a 56 y.o. male admitted on 02/27/2017 with cellulitis.  Pharmacy has been consulted for vancomycin and piperacillin-tazobactam dosing.  Plan: Continue Vanc 1g to be given with each HD. Will plan for random vanc level to be drawn prior to 3rd HD session.   Continue  piperacillin-tazobactam 3.375g IV Q12h (4 hour infusion).   5/26:  Patient to receive 3rd HD treatment today. Vancomycin level pre-HD= 16 mcg/ml (goal 15-25 mcg/ml). Will continue current Vancomycin regimen. F/u Vancomycin level prior to the next 3rd HD session-depending on HD schedule.  5/27: Wound cx: per [facility]- the sensitivities should be ready in am 5/28.     Height: 5\' 10"  (177.8 cm) Weight: (!) 343 lb 0.6 oz (155.6 kg) IBW/kg (Calculated) : 73  Temp (24hrs), Avg:98.8 F (37.1 C), Min:98 F (36.7 C), Max:99.5 F (37.5 C)   Recent Labs Lab 02/27/17 1718 02/28/17 0443 03/01/17 1254 03/02/17 0845 03/02/17 1143  WBC 11.1* 6.7 6.0 8.3  --   CREATININE 8.24* 8.03* 6.03* 5.73*  --   LATICACIDVEN 1.3  --   --   --   --   VANCORANDOM  --   --   --   --  16    Estimated Creatinine Clearance: 21.8 mL/min (A) (by C-G formula based on SCr of 5.73 mg/dL (H)).    Allergies  Allergen Reactions  . Codeine Nausea And Vomiting    Antimicrobials this admission: Vancomycin 5/23 >> Zosyn 5/23 >>  Microbiology results: 5/23 BCx: ordered 5/24 Wound Cx: GPC, GPR, GNR: ABUNDANT STREPTOCOCCUS GROUP G , ABUNDANT ENTEROCOCCUS FAECALIS  Thank you for allowing pharmacy to be a part of this patient's care.  Manual Navarra A, PharmD 03/03/2017 11:26 AM

## 2017-03-03 NOTE — Progress Notes (Signed)
[facility] [place] NAME: Steven [name]    MR#:  [id number]  DATE OF BIRTH:  09-20-1961  SUBJECTIVE:   Patient came in with foul smelling ulcer and drainage with the right foot. Patient seen at hemodialysis. He is status post right foot incision and drainage with debridement of the abscess. REVIEW OF SYSTEMS:   Review of Systems  Constitutional: Negative for chills, fever and weight loss.  HENT: Negative for ear discharge, ear pain and nosebleeds.   Eyes: Negative for blurred vision, pain and discharge.  Respiratory: Negative for sputum production, shortness of breath, wheezing and stridor.   Cardiovascular: Positive for leg swelling. Negative for chest pain, palpitations, orthopnea and PND.  Gastrointestinal: Negative for abdominal pain, diarrhea, nausea and vomiting.  Genitourinary: Negative for frequency and urgency.  Musculoskeletal: Negative for back pain and joint pain.  Neurological: Positive for weakness. Negative for sensory change, speech change and focal weakness.  Psychiatric/Behavioral: Negative for depression and hallucinations. The patient is not nervous/anxious.    Tolerating Diet:yes Tolerating PT: pending  DRUG ALLERGIES:   Allergies  Allergen Reactions  . Codeine Nausea And Vomiting    VITALS:  Blood pressure (!) 143/69, pulse 98, temperature 98.6 F (37 C), temperature source Oral, resp. rate 20, height 5\' 10"  (1.778 m), weight (!) 155.6 kg (343 lb 0.6 oz), SpO2 100 %.  PHYSICAL EXAMINATION:   Physical Exam  GENERAL:  56 y.o.-year-old patient lying in the bed with no acute distress. obese EYES: Pupils equal, round, reactive to light and accommodation. No scleral icterus. Extraocular muscles intact.  HEENT: Head atraumatic, normocephalic. Oropharynx and nasopharynx clear.  NECK:  Supple, no jugular venous distention. No thyroid enlargement, no tenderness.  LUNGS: Normal breath sounds bilaterally, no  wheezing, rales, rhonchi. No use of accessory muscles of respiration.  CARDIOVASCULAR: S1, S2 normal. No murmurs, rubs, or gallops.  ABDOMEN: Soft, nontender, nondistended. Bowel sounds present. No organomegaly or mass.  EXTREMITIES: Surgical dressing with wound VAC NEUROLOGIC: Cranial nerves II through XII are intact. No focal Motor or sensory deficits b/l.   PSYCHIATRIC:  patient is alert and verbal commands but sleepy secondary to recent sedation SKIN: No obvious rash, lesion, or ulcer.   LABORATORY PANEL:  CBC  Recent Labs Lab 03/02/17 0845  WBC 8.3  HGB 8.8*  HCT 26.2*  PLT 315    Chemistries   Recent Labs Lab 02/27/17 1718  03/02/17 0845  NA 134*  < > 139  K 5.3*  < > 4.1  CL 99*  < > 101  CO2 22  < > 29  GLUCOSE 251*  < > 231*  BUN 72*  < > 37*  CREATININE 8.24*  < > 5.73*  CALCIUM 8.6*  < > 8.0*  AST 17  --   --   ALT 17  --   --   ALKPHOS 129*  --   --   BILITOT 0.3  --   --   < > = values in this interval not displayed. Cardiac Enzymes No results for input(s): TROPONINI in the last 168 hours. RADIOLOGY:  No results found. ASSESSMENT AND PLAN:   Steven [name]  is a 56 y.o. male with a known history of Chronic kidney disease, diabetes, high cholesterol, hypertension- had a fall 6 weeks ago and broke his elbow, went to his primary care doctor's office, x-rays showed Comminuted fracture and was advised to go to a tertiary care center and special. Steven [name].  *  Right diabetic foot abscess/Cellulitis/ulcer  With foul smelling ulcers and severe edema on right foot, possible osteomyelitis - IV vancomycin and Zosyn -Podiatry consult appreciated. Patient is status post I and D of abscess and debridement -Infectious disease consultation noted  * Acute on chronic renal failure, CKD stage V--- now ESRD - Nephrology consulted.  -Patient started on hemodialysis this hospitalization - hold nephrotoxic medication. -Temporary cath to be placed on May 24 by Dr  Lucky Cowboy  * Hypertension Amlodipine and hydralazine  * Diabetes--poorly uncontrolled   Insulin sliding scale coverage and Lantus. Seen by Diabetes coordinator Pt has very poorly controlled DM  * Anemia   Likely due to chronic kidney disease.  Case discussed with Care Management/Social Worker. Management plans discussed with the patient, family and they are in agreement.  CODE STATUS: FULL  DVT Prophylaxis: Heparin  TOTAL TIME TAKING CARE OF THIS PATIENT: *30* minutes.  >50% time spent on counselling and coordination of care  POSSIBLE D/C IN 3-4DAYS, DEPENDING ON CLINICAL CONDITION.  Note: This dictation was prepared with Dragon dictation along with smaller phrase technology. Any transcriptional errors that result from this process are unintentional.  Steven [name] M.D on 03/03/2017 at 1:55 PM  Between 7am to 6pm - Pager - [phone]  After 6pm go to [email or web address] - password [email or web address] [facility]  Office  [phone]  CC: Primary care physician; [name], MD

## 2017-03-03 NOTE — Progress Notes (Signed)
[facility]  ROUNDING NOTE   Subjective:   Third hemodialysis treatment yesterday. Tolerated treatment well. UF of 840mL  I&D by Dr. Elvina Mattes 5/25. Wound vac changed this morning  Objective:  Vital signs in last 24 hours:  Temp:  [98 F (36.7 C)-99.5 F (37.5 C)] 98.6 F (37 C) (05/27 0514) Pulse Rate:  [87-100] 98 (05/27 0537) Resp:  [13-25] 20 (05/26 2051) BP: (125-186)/(43-92) 167/87 (05/27 0537) SpO2:  [94 %-100 %] 100 % (05/27 0537) Weight:  [155.6 kg (343 lb 0.6 oz)-159.3 kg (351 lb 3.1 oz)] 155.6 kg (343 lb 0.6 oz) (05/26 1645)  Weight change: 0.1 kg (3.5 oz) Filed Weights   03/01/17 1505 03/02/17 1355 03/02/17 1645  Weight: (!) 158 kg (348 lb 5.2 oz) (!) 159.3 kg (351 lb 3.1 oz) (!) 155.6 kg (343 lb 0.6 oz)    Intake/Output: I/O last 3 completed shifts: In: 734 [P.O.:838; IV Piggyback:50] Out: 1937 [Urine:3150; Other:867]   Intake/Output this shift:  Total I/O In: 240 [P.O.:240] Out: 500 [Urine:500]  Physical Exam: General: NAD, laying in bed  Head: Normocephalic, atraumatic. Moist oral mucosal membranes  Eyes: Anicteric, PERRL  Neck: Supple, trachea midline  Lungs:  Clear to auscultation  Heart: Regular rate and rhythm  Abdomen:  Soft, nontender, obese  Extremities: + peripheral edema, right foot wound vac, clean dry intact dressings  Neurologic: Nonfocal, moving all four extremities  Skin: No lesions  Access: Right femoral temp HD catheter Dr. Lucky Cowboy 9/02    Basic Metabolic Panel:  Recent Labs Lab 02/27/17 1718 02/28/17 0443 02/28/17 1030 03/01/17 0446 03/01/17 1254 03/02/17 0845  NA 134* 135  --   --  138 139  K 5.3* 4.8  --  4.6 4.0 4.1  CL 99* 101  --   --  102 101  CO2 22 24  --   --  28 29  GLUCOSE 251* 291*  --   --  190* 231*  BUN 72* 71*  --   --  44* 37*  CREATININE 8.24* 8.03*  --   --  6.03* 5.73*  CALCIUM 8.6* 8.1*  --   --  7.8* 8.0*  PHOS  --   --  6.4*  --  5.4* 5.1*    Liver Function Tests:  Recent Labs Lab  02/27/17 1718 03/01/17 1254 03/02/17 0845  AST 17  --   --   ALT 17  --   --   ALKPHOS 129*  --   --   BILITOT 0.3  --   --   PROT 7.6  --   --   ALBUMIN 2.6* 2.1* 2.2*   No results for input(s): LIPASE, AMYLASE in the last 168 hours. No results for input(s): AMMONIA in the last 168 hours.  CBC:  Recent Labs Lab 02/27/17 1718 02/28/17 0443 03/01/17 1254 03/02/17 0845  WBC 11.1* 6.7 6.0 8.3  NEUTROABS 9.2*  --   --   --   HGB 9.4* 9.4* 8.5* 8.8*  HCT 28.1* 27.7* 25.2* 26.2*  MCV 86.1 86.5 86.5 85.3  PLT 337 332 302 315    Cardiac Enzymes: No results for input(s): CKTOTAL, CKMB, CKMBINDEX, TROPONINI in the last 168 hours.  BNP: Invalid input(s): POCBNP  CBG:  Recent Labs Lab 03/02/17 0746 03/02/17 1247 03/02/17 1717 03/02/17 2103 03/03/17 0722  GLUCAP 194* 220* 107* 186* 163*    Microbiology: Results for orders placed or performed during the hospital encounter of 02/27/17  Blood culture (routine x 2)  Status: None (Preliminary result)   Collection Time: 02/27/17  7:09 PM  Result Value Ref Range Status   Specimen Description BLOOD RIGHT HAND  Final   Special Requests Blood Culture adequate volume  Final   Culture NO GROWTH 4 DAYS  Final   Report Status PENDING  Incomplete  Blood culture (routine x 2)     Status: None (Preliminary result)   Collection Time: 02/27/17  7:09 PM  Result Value Ref Range Status   Specimen Description BLOOD RIGHT AC  Final   Special Requests Blood Culture adequate volume  Final   Culture NO GROWTH 4 DAYS  Final   Report Status PENDING  Incomplete  Aerobic/Anaerobic Culture (surgical/deep wound)     Status: None (Preliminary result)   Collection Time: 02/28/17  3:30 PM  Result Value Ref Range Status   Specimen Description FOOT  Final   Special Requests Immunocompromised  Final   Gram Stain   Final    MODERATE WBC PRESENT,BOTH PMN AND MONONUCLEAR ABUNDANT GRAM NEGATIVE RODS ABUNDANT GRAM POSITIVE COCCI FEW GRAM POSITIVE  RODS    Culture   Final    ABUNDANT STREPTOCOCCUS GROUP G ABUNDANT ENTEROCOCCUS FAECALIS SUSCEPTIBILITIES TO FOLLOW Performed at [facility], [place] [address], [place], [place] [address]    Report Status PENDING  Incomplete  Surgical PCR screen     Status: Abnormal   Collection Time: 02/28/17  5:00 PM  Result Value Ref Range Status   MRSA, PCR NEGATIVE NEGATIVE Final   Staphylococcus aureus POSITIVE (A) NEGATIVE Final    Comment:        The Xpert SA Assay (FDA approved for NASAL specimens in patients over 55 years of age), is one component of a comprehensive surveillance program.  Test performance has been validated by [facility] for patients greater than or equal to 26 year old. It is not intended to diagnose infection nor to guide or monitor treatment.   Culture, blood (routine x 2)     Status: None (Preliminary result)   Collection Time: 03/02/17  8:45 AM  Result Value Ref Range Status   Specimen Description BLOOD RIGHT ASSIST CONTROL  Final   Special Requests   Final    BOTTLES DRAWN AEROBIC AND ANAEROBIC Blood Culture results may not be optimal due to an excessive volume of blood received in culture bottles   Culture NO GROWTH < 24 HOURS  Final   Report Status PENDING  Incomplete  Culture, blood (routine x 2)     Status: None (Preliminary result)   Collection Time: 03/02/17  9:11 AM  Result Value Ref Range Status   Specimen Description BLOOD RIGHT ASSIST CONTROL  Final   Special Requests   Final    BOTTLES DRAWN AEROBIC AND ANAEROBIC Blood Culture adequate volume   Culture NO GROWTH < 24 HOURS  Final   Report Status PENDING  Incomplete    Coagulation Studies: No results for input(s): LABPROT, INR in the last 72 hours.  Urinalysis: No results for input(s): COLORURINE, LABSPEC, PHURINE, GLUCOSEU, HGBUR, BILIRUBINUR, KETONESUR, PROTEINUR, UROBILINOGEN, NITRITE, LEUKOCYTESUR in the last 72 hours.  Invalid input(s): APPERANCEUR    Imaging: No results  found.   Medications:   . piperacillin-tazobactam (ZOSYN)  IV 3.375 g (03/03/17 0910)  . vancomycin Stopped (03/02/17 1725)   . amLODipine  10 mg Oral Daily  . aspirin EC  81 mg Oral Daily  . Chlorhexidine Gluconate Cloth  6 each Topical Daily  . gabapentin  300 mg  Oral BID  . heparin  5,000 Units Subcutaneous Q8H  . hydrALAZINE  50 mg Oral Q8H  . insulin aspart  0-15 Units Subcutaneous TID WC  . insulin glargine  30 Units Subcutaneous Daily  . living well with diabetes book   Does not apply Once  . metoprolol tartrate  25 mg Oral BID   docusate sodium, oxyCODONE-acetaminophen, vancomycin  Assessment/ Plan:  Steven [name] is a 56 y.o. white male  with hypertension, diabetes mellitus type 2 insulin dependent, hyperlipidemia, GERD, and morbid obesity  1. 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 No biopsy.  Hyperkalemia on admission Initiated on hemodialysis on this admission. First dialysis was 5/24. Tolerated treatment well.  - PPD negative.  - Hemodialysis treatment for tomorrow. Orders prepared.  - Will need permcath before discharge. Outpatient planning for [facility] MWF schedule  2. Hypertension: blood pressure at goal. Currently holding lisinopril.  - amlodipine, hydralazine - restarted on metoprolol. (patient was originally taking both carvedilol and metoprolol)  3. Anemia of chronic kidney disease: hemoglobin 8.8. Normocytic - EPO with Hemodialysis treatment   4. Secondary Hyperparathyroidism: calcium at goal. Not currently on vitamin D. PTH 121. Phosphorus 5.1 - at goal. No currently on phosphate binders. Discussed low phos diet.   5. Diabetes mellitus type II with chronic kidney disease: hemoglobin W3S 9.3% complicated with with diabetic foot ulcer right foot status post I&D 5/25 Dr. Elvina Mattes. Wound vac - Appreciate ID and podiatry input.  - Continue glucose control.  - empiric  antibiotic zosyn and vanco  6. Left olecrenon fracture: working with OT - Appreciate ortho input. Patient will need to see an elbow specialist.     LOS: 4 Steven [name] 5/27/201811:11 AM

## 2017-03-03 NOTE — Progress Notes (Signed)
Patient Demographics  Steven [name], is a 56 y.o. male   MRN: [id number]   DOB - [birth year]  Admit Date - 02/27/2017    Outpatient Primary MD for the patient is [name], MD  Consult requested in the Hospital by Fritzi Mandes, MD, On 03/03/2017    With History of -  Past Medical History:  Diagnosis Date  . Cellulitis and abscess of right lower extremity 03/01/2017  . Chronic kidney disease   . Diabetes mellitus without complication ([place])   . Edema extremities 03/01/2017   bilateral swelling  . High cholesterol   . High triglycerides   . Hypertension   . Sleep apnea    can't use cpap d/t feelings of suffocation      Past Surgical History:  Procedure Laterality Date  . APPENDECTOMY  2010  . IRRIGATION AND DEBRIDEMENT FOOT Right 03/01/2017   Procedure: IRRIGATION AND DEBRIDEMENT FOOT;  Surgeon: Albertine Patricia, DPM;  Location: [facility] ORS;  Service: Podiatry;  Laterality: Right;  application of wound vac    in for   Chief Complaint  Patient presents with  . Wound Check  . Leg Swelling  . Elbow Injury     HPI  Steven [name]  is a 56 y.o. male, 2 days status post debridement of infected wound right dorsal foot. States he is doing well with minimal pain across the region. Wound VAC is intact.   Social History Social History  Substance Use Topics  . Smoking status: Never Smoker  . Smokeless tobacco: Never Used  . Alcohol use Yes     Comment: up until 01/2017 was heavy drinker...4 40 oz qd    Family History Family History  Problem Relation Age of Onset  . CAD Father      Anti-infectives    Start     Dose/Rate Route Frequency Ordered Stop   03/01/17 0000  vancomycin (VANCOCIN) IVPB 1000 mg/200 mL premix  Status:  Discontinued     1,000 mg 200 mL/hr over 60 Minutes Intravenous As  needed 02/28/17 1035 02/28/17 1232   02/28/17 1232  vancomycin (VANCOCIN) IVPB 1000 mg/200 mL premix     1,000 mg 200 mL/hr over 60 Minutes Intravenous Every Dialysis 02/28/17 1232     02/28/17 1000  piperacillin-tazobactam (ZOSYN) IVPB 3.375 g     3.375 g 12.5 mL/hr over 240 Minutes Intravenous Every 12 hours 02/27/17 1857     02/27/17 1900  piperacillin-tazobactam (ZOSYN) IVPB 3.375 g     3.375 g 100 mL/hr over 30 Minutes Intravenous  Once 02/27/17 1848 02/27/17 2309   02/27/17 1845  vancomycin (VANCOCIN) 2,000 mg in sodium chloride 0.9 % 500 mL IVPB     2,000 mg 250 mL/hr over 120 Minutes Intravenous  Once 02/27/17 1848 02/27/17 2235      Scheduled Meds: . amLODipine  10 mg Oral Daily  . aspirin EC  81 mg Oral Daily  . Chlorhexidine Gluconate Cloth  6 each Topical Daily  . gabapentin  300 mg Oral BID  . heparin  5,000 Units Subcutaneous Q8H  . hydrALAZINE  50 mg Oral Q8H  . insulin aspart  0-15 Units Subcutaneous TID WC  . insulin  glargine  30 Units Subcutaneous Daily  . living well with diabetes book   Does not apply Once   Continuous Infusions: . piperacillin-tazobactam (ZOSYN)  IV Stopped (03/03/17 0040)  . vancomycin Stopped (03/02/17 1725)   PRN Meds:.docusate sodium, oxyCODONE-acetaminophen, vancomycin  Allergies  Allergen Reactions  . Codeine Nausea And Vomiting    Physical Exam  Vitals  Blood pressure (!) 167/87, pulse 98, temperature 98.6 F (37 C), temperature source Oral, resp. rate 20, height 5\' 10"  (1.778 m), weight (!) 155.6 kg (343 lb 0.6 oz), SpO2 100 %.  Lower Extremity exam:Wound VAC was removed today. Wound actually looks very good it's shallower compared to debridement 2 days ago. With compression there is just a very mild amount of purulence draining from one tendon sheath but it looks like it's minimal. Patient still has some cellulitis to the dorsum of the foot but appears to be improving. Still on Zosyn and vancomycin. Patient still has  significant edema to both lower extremities secondary to lymphedema total legs and feet. Data Review  CBC  Recent Labs Lab 02/27/17 1718 02/28/17 0443 03/01/17 1254 03/02/17 0845  WBC 11.1* 6.7 6.0 8.3  HGB 9.4* 9.4* 8.5* 8.8*  HCT 28.1* 27.7* 25.2* 26.2*  PLT 337 332 302 315  MCV 86.1 86.5 86.5 85.3  MCH 28.9 29.3 29.3 28.8  MCHC 33.5 33.9 33.9 33.7  RDW 13.1 13.4 13.3 13.4  LYMPHSABS 0.7*  --   --   --   MONOABS 0.9  --   --   --   EOSABS 0.2  --   --   --   BASOSABS 0.1  --   --   --    ------------------------------------------------------------------------------------------------------------------  Chemistries   Recent Labs Lab 02/27/17 1718 02/28/17 0443 03/01/17 0446 03/01/17 1254 03/02/17 0845  NA 134* 135  --  138 139  K 5.3* 4.8 4.6 4.0 4.1  CL 99* 101  --  102 101  CO2 22 24  --  28 29  GLUCOSE 251* 291*  --  190* 231*  BUN 72* 71*  --  44* 37*  CREATININE 8.24* 8.03*  --  6.03* 5.73*  CALCIUM 8.6* 8.1*  --  7.8* 8.0*  AST 17  --   --   --   --   ALT 17  --   --   --   --   ALKPHOS 129*  --   --   --   --   BILITOT 0.3  --   --   --   --    -------------------------------------------------------------------------------------------Assessment & Plan: Overall the foot looks much better patient's been afebrile and his white count is within normal limits. Right now I'll apply another wound VAC and continue to watch it closely. Still awaiting SENSITIVITIES. APPEARS TO BE ABUNDANT STREP GROUP G AS WELL AS ENTEROCOCCUS. DR. Ola Spurr FOLLOWING AS WELL. PATIENT APPARENTLY CAME TO BED RIGHT NOW BECAUSE OF BROKEN ARM AS WELL AS A CATHETER. Hopefully we'll get up and transfer to chair at some point. May have to consider rehabilitation.  Principal Problem:   Cellulitis in diabetic foot ([place]) Active Problems:   Acute on chronic renal failure ([place])   Cellulitis     Family Communication: Plan discussed patient   Perry Mount M.D on 03/03/2017 at 8:27  AM  Thank you for the consult, we will follow the patient with you in the Hospital.

## 2017-03-03 NOTE — Progress Notes (Signed)
Notified Dr. Fritzi Mandes of pt elevated HR per central telemetry. Will continue to monitor

## 2017-03-04 LAB — GLUCOSE, CAPILLARY
GLUCOSE-CAPILLARY: 114 mg/dL — AB (ref 65–99)
Glucose-Capillary: 119 mg/dL — ABNORMAL HIGH (ref 65–99)
Glucose-Capillary: 207 mg/dL — ABNORMAL HIGH (ref 65–99)
Glucose-Capillary: 223 mg/dL — ABNORMAL HIGH (ref 65–99)

## 2017-03-04 LAB — CULTURE, BLOOD (ROUTINE X 2)
Culture: NO GROWTH
Culture: NO GROWTH
SPECIAL REQUESTS: ADEQUATE
Special Requests: ADEQUATE

## 2017-03-04 MED ORDER — EPOETIN ALFA 10000 UNIT/ML IJ SOLN
10000.0000 [IU] | INTRAMUSCULAR | Status: DC
  Administered 2017-03-04 – 2017-03-05 (×2): 10000 [IU] via INTRAVENOUS

## 2017-03-04 MED ORDER — SODIUM CHLORIDE 0.9 % IV SOLN
2.0000 g | INTRAVENOUS | Status: DC
  Administered 2017-03-04: 2 g via INTRAVENOUS
  Filled 2017-03-04 (×3): qty 2000

## 2017-03-04 MED ORDER — ACETAMINOPHEN 325 MG PO TABS
650.0000 mg | ORAL_TABLET | Freq: Once | ORAL | Status: AC
  Administered 2017-03-04: 650 mg via ORAL

## 2017-03-04 NOTE — Progress Notes (Signed)
Patient Demographics  Steven [name], is a 56 y.o. male   MRN: [id number]   DOB - September 23, 1961  Admit Date - 02/27/2017    Outpatient Primary MD for the patient is [name], MD  Consult requested in the Hospital by Fritzi Mandes, MD, On 03/04/2017     With History of -  Past Medical History:  Diagnosis Date  . Cellulitis and abscess of right lower extremity 03/01/2017  . Chronic kidney disease   . Diabetes mellitus without complication ([place])   . Edema extremities 03/01/2017   bilateral swelling  . High cholesterol   . High triglycerides   . Hypertension   . Sleep apnea    can't use cpap d/t feelings of suffocation      Past Surgical History:  Procedure Laterality Date  . APPENDECTOMY  2010  . IRRIGATION AND DEBRIDEMENT FOOT Right 03/01/2017   Procedure: IRRIGATION AND DEBRIDEMENT FOOT;  Surgeon: Albertine Patricia, DPM;  Location: [facility] ORS;  Service: Podiatry;  Laterality: Right;  application of wound vac    in for   Chief Complaint  Patient presents with  . Wound Check  . Leg Swelling  . Elbow Injury     HPI  Steven [name]  is a 56 y.o. male, 4 days status post debridement of infected ulceration to the dorsum of the right foot with application wound VAC following this. Wound VAC was changed yesterday. Patient is continued to progress and feels comfortable with this point.    Review of Systems  is alert and well-oriented distress states she feels fine. He is afebrile. Patient is on dialysis  Social History Social History  Substance Use Topics  . Smoking status: Never Smoker  . Smokeless tobacco: Never Used  . Alcohol use Yes     Comment: up until 01/2017 was heavy drinker...4 40 oz qd     Family History Family History  Problem Relation Age of Onset  . CAD Father       Anti-infectives    Start     Dose/Rate Route Frequency Ordered Stop   03/01/17 0000  vancomycin (VANCOCIN) IVPB 1000 mg/200 mL premix  Status:  Discontinued     1,000 mg 200 mL/hr over 60 Minutes Intravenous As needed 02/28/17 1035 02/28/17 1232   02/28/17 1232  vancomycin (VANCOCIN) IVPB 1000 mg/200 mL premix     1,000 mg 200 mL/hr over 60 Minutes Intravenous Every Dialysis 02/28/17 1232     02/28/17 1000  piperacillin-tazobactam (ZOSYN) IVPB 3.375 g     3.375 g 12.5 mL/hr over 240 Minutes Intravenous Every 12 hours 02/27/17 1857     02/27/17 1900  piperacillin-tazobactam (ZOSYN) IVPB 3.375 g     3.375 g 100 mL/hr over 30 Minutes Intravenous  Once 02/27/17 1848 02/27/17 2309   02/27/17 1845  vancomycin (VANCOCIN) 2,000 mg in sodium chloride 0.9 % 500 mL IVPB     2,000 mg 250 mL/hr over 120 Minutes Intravenous  Once 02/27/17 1848 02/27/17 2235      Scheduled Meds: . amLODipine  10 mg Oral Daily  . aspirin EC  81 mg Oral Daily  . Chlorhexidine Gluconate Cloth  6 each Topical Daily  .  epoetin (EPOGEN/PROCRIT) injection  10,000 Units Intravenous Q M,W,F-HD  . gabapentin  300 mg Oral BID  . heparin  5,000 Units Subcutaneous Q8H  . hydrALAZINE  50 mg Oral Q8H  . insulin aspart  0-15 Units Subcutaneous TID WC  . insulin glargine  30 Units Subcutaneous Daily  . living well with diabetes book   Does not apply Once  . metoprolol tartrate  25 mg Oral BID   Allergies  Allergen Reactions  . Codeine Nausea And Vomiting    Physical Exam  Vitals  Blood pressure (!) 155/71, pulse 82, temperature 98.3 F (36.8 C), temperature source Oral, resp. rate 20, height 5\' 10"  (1.778 m), weight (!) 155.6 kg (343 lb 0.6 oz), SpO2 100 %.  Lower Extremity exam:Exam of right lower extremity shows the wound VAC is in place he is responding well to that. It is functional. Cellulitis is apparently much improved on the dorsum of the right foot. No cellulitis to the ankle or leg at this point.  Swelling to the foot has improved somewhat. Patient still has severe chronic lymphedema of both lower extremities Data Review  CBC  Recent Labs Lab 02/27/17 1718 02/28/17 0443 03/01/17 1254 03/02/17 0845  WBC 11.1* 6.7 6.0 8.3  HGB 9.4* 9.4* 8.5* 8.8*  HCT 28.1* 27.7* 25.2* 26.2*  PLT 337 332 302 315  MCV 86.1 86.5 86.5 85.3  MCH 28.9 29.3 29.3 28.8  MCHC 33.5 33.9 33.9 33.7  RDW 13.1 13.4 13.3 13.4  LYMPHSABS 0.7*  --   --   --   MONOABS 0.9  --   --   --   EOSABS 0.2  --   --   --   BASOSABS 0.1  --   --   --    ------------------------------------------------------------------------------------------------------------------  Chemistries   Recent Labs Lab 02/27/17 1718 02/28/17 0443 03/01/17 0446 03/01/17 1254 03/02/17 0845  NA 134* 135  --  138 139  K 5.3* 4.8 4.6 4.0 4.1  CL 99* 101  --  102 101  CO2 22 24  --  28 29  GLUCOSE 251* 291*  --  190* 231*  BUN 72* 71*  --  44* 37*  CREATININE 8.24* 8.03*  --  6.03* 5.73*  CALCIUM 8.6* 8.1*  --  7.8* 8.0*  AST 17  --   --   --   --   ALT 17  --   --   --   --   ALKPHOS 129*  --   --   --   --   BILITOT 0.3  --   --   --   --    ------- Assessment & Plan: Currently we'll plan to leave the wound VAC in place. He can start to get up and transfer weight over to a reclining chair and also bedside closed at this point. He does need to have his postop shoe on whenever he stands at this point. He will likely be in the hospital for most of this upcoming week due to the need for the placement of a permanent catheter at some point during the week. He does not have insurance and wound VAC will not be covered for him. It may be that due to the fact he does have good circulation uses a wound VAC (week that'll be enough to stimulate enough granulation performed continue to heal. He did improve quite a bit between Friday surgery and yesterday.  Plan: Continue wound VAC for now. If physical therapy to  help assist with his standing and  weight transfer using a walker and having a postop shoe on.  Principal Problem:   Cellulitis in diabetic foot ([place]) Active Problems:   Acute on chronic renal failure ([place])   Cellulitis     Family Communication: Plan discussed with patient    Perry Mount M.D on 03/04/2017 at 10:00 AM  Thank you for the consult, we will follow the patient with you in the Hospital.

## 2017-03-04 NOTE — Progress Notes (Signed)
Pharmacy Antibiotic Note  Steven [name] is a 56 y.o. male admitted on 02/27/2017 with cellulitis.  Pharmacy has been consulted for ampicillin dosing based on wound cx sensitivies. Patient has been receiving piperacillin/tazobactam. Patient was started on dialysis this hospitalization  Plan: Transition patient from piperacillin/tazobactam 3.375 g IV q 12 hours to ampicillin 2 g IV q 24 hours to be given after dialysis on dialysis days  Height: 5\' 10"  (177.8 cm) Weight: (!) 334 lb 10.5 oz (151.8 kg) IBW/kg (Calculated) : 73  Temp (24hrs), Avg:99 F (37.2 C), Min:98.1 F (36.7 C), Max:99.7 F (37.6 C)   Recent Labs Lab 02/27/17 1718 02/28/17 0443 03/01/17 1254 03/02/17 0845 03/02/17 1143  WBC 11.1* 6.7 6.0 8.3  --   CREATININE 8.24* 8.03* 6.03* 5.73*  --   LATICACIDVEN 1.3  --   --   --   --   VANCORANDOM  --   --   --   --  16    Estimated Creatinine Clearance: 21.5 mL/min (A) (by C-G formula based on SCr of 5.73 mg/dL (H)).    Allergies  Allergen Reactions  . Codeine Nausea And Vomiting    Antimicrobials this admission: Vancomycin 5/23 >> 5/26 Piperacillin/tazo 5/23 >> 5/27 ampicillin 5/28 >>   Dose adjustments this admission:  Microbiology results: 5/23 BCx: NG x 5 days 5/24 Wound Cx: Group G strep; E faecalis sensitive to ampicillin  5/24 MRSA surgical PCR: positive  Thank you for allowing pharmacy to be a part of this patient's care.  Darrow Bussing, PharmD Pharmacy Resident 03/04/2017 2:01 PM

## 2017-03-04 NOTE — Care Management (Signed)
Hep Screen Results Results for Steven [name], Steven [name] (MRN [id number]) as of 03/04/2017 08:32  Ref. Range 02/28/2017 10:30 02/28/2017 15:00  Hepatitis B Surface Ag Latest Ref Range: Negative  Negative   Hep B S Ab Unknown Non Reactive   Hep B Core Ab, Tot Latest Ref Range: Negative   Negative  Hep B Core Ab, IgM Latest Ref Range: Negative  Negative

## 2017-03-04 NOTE — Progress Notes (Signed)
Second HD Treatment 03/01/2017    03/01/17 1230 03/01/17 1300 03/01/17 1330  During Hemodialysis Assessment  Blood Flow Rate (mL/min) 250 mL/min 250 mL/min 250 mL/min  Arterial Pressure (mmHg) -80 mmHg -90 mmHg -90 mmHg  Venous Pressure (mmHg) 90 mmHg 100 mmHg 100 mmHg  Transmembrane Pressure (mmHg) 50 mmHg 50 mmHg 50 mmHg  Ultrafiltration Rate (mL/min) 400 mL/min 400 mL/min 400 mL/min  Dialysate Flow Rate (mL/min) 500 ml/min 500 ml/min 500 ml/min  Conductivity: Machine  14.1 13.7 13.7  HD Safety Checks Performed Yes Yes Yes  Dialysis Fluid Bolus Normal Saline --  --   Bolus Amount (mL) 250 mL --  --   Intra-Hemodialysis Comments Tx initiated Progressing as prescribed Progressing as prescribed     03/01/17 1400 03/01/17 1430 03/01/17 1500  During Hemodialysis Assessment  Blood Flow Rate (mL/min) 250 mL/min 250 mL/min 250 mL/min  Arterial Pressure (mmHg) -90 mmHg -90 mmHg -90 mmHg  Venous Pressure (mmHg) 100 mmHg 110 mmHg 110 mmHg  Transmembrane Pressure (mmHg) 50 mmHg 50 mmHg 50 mmHg  Ultrafiltration Rate (mL/min) 590 mL/min 590 mL/min 590 mL/min  Dialysate Flow Rate (mL/min) 500 ml/min 500 ml/min 500 ml/min  Conductivity: Machine  13.7 13.7 13.7  HD Safety Checks Performed Yes Yes Yes  Dialysis Fluid Bolus Meds --  --   Bolus Amount (mL) 200 mL --  --   Intra-Hemodialysis Comments Progressing as prescribed Progressing as prescribed Progressing as prescribed     03/01/17 1505  During Hemodialysis Assessment  Blood Flow Rate (mL/min) --   Arterial Pressure (mmHg) --   Venous Pressure (mmHg) --   Transmembrane Pressure (mmHg) --   Ultrafiltration Rate (mL/min) --   Dialysate Flow Rate (mL/min) --   Conductivity: Machine  --   HD Safety Checks Performed --   Dialysis Fluid Bolus --   Bolus Amount (mL) --   Intra-Hemodialysis Comments Tx completed

## 2017-03-04 NOTE — Progress Notes (Signed)
[facility]  ROUNDING NOTE   Subjective:   Seen and examined on hemodialysis. BFR only 273mL/m. Femoral catheter.  UF goal of 3.5  Wound vac changed this morning.   Objective:  Vital signs in last 24 hours:  Temp:  [98.1 F (36.7 C)-99.5 F (37.5 C)] 99.5 F (37.5 C) (05/28 1000) Pulse Rate:  [82-96] 96 (05/28 1100) Resp:  [17-24] 20 (05/28 1100) BP: (119-155)/(55-74) 126/74 (05/28 1100) SpO2:  [93 %-100 %] 93 % (05/28 1006) Weight:  [151.9 kg (334 lb 14.1 oz)] 151.9 kg (334 lb 14.1 oz) (05/28 1000)  Weight change:  Filed Weights   03/02/17 1355 03/02/17 1645 03/04/17 1000  Weight: (!) 159.3 kg (351 lb 3.1 oz) (!) 155.6 kg (343 lb 0.6 oz) (!) 151.9 kg (334 lb 14.1 oz)    Intake/Output: I/O last 3 completed shifts: In: 28 [P.O.:720] Out: 2500 [Urine:2500]   Intake/Output this shift:  Total I/O In: 240 [P.O.:240] Out: 500 [Urine:500]  Physical Exam: General: NAD, laying in bed  Head: Normocephalic, atraumatic. Moist oral mucosal membranes  Eyes: Anicteric, PERRL  Neck: Supple, trachea midline  Lungs:  Clear to auscultation  Heart: Regular rate and rhythm  Abdomen:  Soft, nontender, obese  Extremities: + peripheral edema, right foot wound vac, clean dry intact dressings  Neurologic: Nonfocal, moving all four extremities  Skin: No lesions  Access: Right femoral temp HD catheter Dr. Lucky Cowboy 2/40    Basic Metabolic Panel:  Recent Labs Lab 02/27/17 1718 02/28/17 0443 02/28/17 1030 03/01/17 0446 03/01/17 1254 03/02/17 0845  NA 134* 135  --   --  138 139  K 5.3* 4.8  --  4.6 4.0 4.1  CL 99* 101  --   --  102 101  CO2 22 24  --   --  28 29  GLUCOSE 251* 291*  --   --  190* 231*  BUN 72* 71*  --   --  44* 37*  CREATININE 8.24* 8.03*  --   --  6.03* 5.73*  CALCIUM 8.6* 8.1*  --   --  7.8* 8.0*  PHOS  --   --  6.4*  --  5.4* 5.1*    Liver Function Tests:  Recent Labs Lab 02/27/17 1718 03/01/17 1254 03/02/17 0845  AST 17  --   --   ALT 17   --   --   ALKPHOS 129*  --   --   BILITOT 0.3  --   --   PROT 7.6  --   --   ALBUMIN 2.6* 2.1* 2.2*   No results for input(s): LIPASE, AMYLASE in the last 168 hours. No results for input(s): AMMONIA in the last 168 hours.  CBC:  Recent Labs Lab 02/27/17 1718 02/28/17 0443 03/01/17 1254 03/02/17 0845  WBC 11.1* 6.7 6.0 8.3  NEUTROABS 9.2*  --   --   --   HGB 9.4* 9.4* 8.5* 8.8*  HCT 28.1* 27.7* 25.2* 26.2*  MCV 86.1 86.5 86.5 85.3  PLT 337 332 302 315    Cardiac Enzymes: No results for input(s): CKTOTAL, CKMB, CKMBINDEX, TROPONINI in the last 168 hours.  BNP: Invalid input(s): POCBNP  CBG:  Recent Labs Lab 03/03/17 0722 03/03/17 1223 03/03/17 1644 03/03/17 2020 03/04/17 0722  GLUCAP 163* 124* 105* 154* 114*    Microbiology: Results for orders placed or performed during the hospital encounter of 02/27/17  Blood culture (routine x 2)     Status: None   Collection Time: 02/27/17  7:09 PM  Result Value Ref Range Status   Specimen Description BLOOD RIGHT HAND  Final   Special Requests Blood Culture adequate volume  Final   Culture NO GROWTH 5 DAYS  Final   Report Status 03/04/2017 FINAL  Final  Blood culture (routine x 2)     Status: None   Collection Time: 02/27/17  7:09 PM  Result Value Ref Range Status   Specimen Description BLOOD RIGHT [facility]  Final   Special Requests Blood Culture adequate volume  Final   Culture NO GROWTH 5 DAYS  Final   Report Status 03/04/2017 FINAL  Final  Aerobic/Anaerobic Culture (surgical/deep wound)     Status: None (Preliminary result)   Collection Time: 02/28/17  3:30 PM  Result Value Ref Range Status   Specimen Description FOOT  Final   Special Requests Immunocompromised  Final   Gram Stain   Final    MODERATE WBC PRESENT,BOTH PMN AND MONONUCLEAR ABUNDANT GRAM NEGATIVE RODS ABUNDANT GRAM POSITIVE COCCI FEW GRAM POSITIVE RODS Performed at [facility], [place] [address] [place], [place] [address]    Culture   Final     ABUNDANT STREPTOCOCCUS GROUP G ABUNDANT ENTEROCOCCUS FAECALIS NO ANAEROBES ISOLATED; CULTURE IN PROGRESS FOR 5 DAYS    Report Status PENDING  Incomplete   Organism ID, Bacteria ENTEROCOCCUS FAECALIS  Final      Susceptibility   Enterococcus faecalis - MIC*    AMPICILLIN <=2 SENSITIVE Sensitive     VANCOMYCIN 1 SENSITIVE Sensitive     GENTAMICIN SYNERGY SENSITIVE Sensitive     * ABUNDANT ENTEROCOCCUS FAECALIS  Surgical PCR screen     Status: Abnormal   Collection Time: 02/28/17  5:00 PM  Result Value Ref Range Status   MRSA, PCR NEGATIVE NEGATIVE Final   Staphylococcus aureus POSITIVE (A) NEGATIVE Final    Comment:        The Xpert SA Assay (FDA approved for NASAL specimens in patients over 38 years of age), is one component of a comprehensive surveillance program.  Test performance has been validated by [facility] for patients greater than or equal to 81 year old. It is not intended to diagnose infection nor to guide or monitor treatment.   Culture, blood (routine x 2)     Status: None (Preliminary result)   Collection Time: 03/02/17  8:45 AM  Result Value Ref Range Status   Specimen Description BLOOD RIGHT ASSIST CONTROL  Final   Special Requests   Final    BOTTLES DRAWN AEROBIC AND ANAEROBIC Blood Culture results may not be optimal due to an excessive volume of blood received in culture bottles   Culture NO GROWTH 2 DAYS  Final   Report Status PENDING  Incomplete  Culture, blood (routine x 2)     Status: None (Preliminary result)   Collection Time: 03/02/17  9:11 AM  Result Value Ref Range Status   Specimen Description BLOOD RIGHT ASSIST CONTROL  Final   Special Requests   Final    BOTTLES DRAWN AEROBIC AND ANAEROBIC Blood Culture adequate volume   Culture NO GROWTH 2 DAYS  Final   Report Status PENDING  Incomplete    Coagulation Studies: No results for input(s): LABPROT, INR in the last 72 hours.  Urinalysis: No results for input(s): COLORURINE, LABSPEC,  PHURINE, GLUCOSEU, HGBUR, BILIRUBINUR, KETONESUR, PROTEINUR, UROBILINOGEN, NITRITE, LEUKOCYTESUR in the last 72 hours.  Invalid input(s): APPERANCEUR    Imaging: No results found.   Medications:   . piperacillin-tazobactam (ZOSYN)  IV Stopped (03/04/17 0157)   . amLODipine  10 mg Oral Daily  . aspirin EC  81 mg Oral Daily  . Chlorhexidine Gluconate Cloth  6 each Topical Daily  . epoetin (EPOGEN/PROCRIT) injection  10,000 Units Intravenous Q M,W,F-HD  . gabapentin  300 mg Oral BID  . heparin  5,000 Units Subcutaneous Q8H  . hydrALAZINE  50 mg Oral Q8H  . insulin aspart  0-15 Units Subcutaneous TID WC  . insulin glargine  30 Units Subcutaneous Daily  . living well with diabetes book   Does not apply Once  . metoprolol tartrate  25 mg Oral BID   docusate sodium, oxyCODONE-acetaminophen  Assessment/ Plan:  [name] is a 56 y.o. white male  with hypertension, diabetes mellitus type 2 insulin dependent, hyperlipidemia, GERD, and morbid obesity  1. 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 No biopsy.  Hyperkalemia on admission Initiated on hemodialysis on this admission. First dialysis was 5/24. Tolerated treatment well.  - PPD negative 5/26.  Seen and examined on hemodialysis. Femoral temp catheter not functioning well.  - Will need permcath before discharge. Outpatient planning for [facility] MWF schedule. Outpatient orders prepared.   2. Hypertension: blood pressure at goal. Currently holding lisinopril.  - amlodipine, hydralazine - restarted on metoprolol. (patient was originally taking both carvedilol and metoprolol)  3. Anemia of chronic kidney disease: hemoglobin 8.8. Normocytic - EPO with Hemodialysis treatment   4. Secondary Hyperparathyroidism: calcium at goal. Not currently on vitamin D. PTH 121. Phosphorus 5.1 - at goal. No currently on phosphate binders. Discussed low phos  diet.   5. Diabetes mellitus type II with chronic kidney disease: hemoglobin L4Y 5.0% complicated with with diabetic foot ulcer right foot status post I&D 5/25 Dr. Elvina Mattes. Wound vac - Appreciate ID and podiatry input.  - Continue glucose control.  - empiric antibiotic zosyn and vanco  6. Left olecrenon fracture: working with OT - Appreciate ortho input. Patient will need to see an elbow specialist.     LOS: [place], [place] 5/28/201811:21 AM

## 2017-03-04 NOTE — Progress Notes (Signed)
Completion of first HD treatment 02/06/2017

## 2017-03-04 NOTE — Progress Notes (Signed)
PPD Results   03/02/17 1135  PPD Results  Does patient have an induration at the injection site? No  Induration(mm) 0 mm  Name of Physician Notified [place]

## 2017-03-04 NOTE — Progress Notes (Addendum)
FIRST HD 02/28/2017   02/28/17 1450 02/28/17 1516 02/28/17 1521  During Hemodialysis Assessment  Blood Flow Rate (mL/min) --  150 mL/min 200 mL/min  Arterial Pressure (mmHg) --  -50 mmHg -80 mmHg  Venous Pressure (mmHg) --  80 mmHg 90 mmHg  Transmembrane Pressure (mmHg) --  20 mmHg 20 mmHg  Ultrafiltration Rate (mL/min) --  250 mL/min 250 mL/min  Dialysate Flow Rate (mL/min) --  300 ml/min 300 ml/min  Conductivity: Machine  --  13.9 13.7  HD Safety Checks Performed --  Yes Yes  Dialysis Fluid Bolus --  Normal Saline --   Bolus Amount (mL) --  250 mL --   Dialysate Change --  Other (comment) (3K) --   Intra-Hemodialysis Comments --  Tx initiated Progressing as prescribed  Note  Observations 1St HD tx today --  --      02/28/17 1530 02/28/17 1600 02/28/17 1630  During Hemodialysis Assessment  Blood Flow Rate (mL/min) 200 mL/min 200 mL/min 200 mL/min  Arterial Pressure (mmHg) -70 mmHg -70 mmHg -70 mmHg  Venous Pressure (mmHg) 90 mmHg 90 mmHg 80 mmHg  Transmembrane Pressure (mmHg) 30 mmHg 30 mmHg 20 mmHg  Ultrafiltration Rate (mL/min) 250 mL/min 250 mL/min 250 mL/min  Dialysate Flow Rate (mL/min) 300 ml/min 300 ml/min 300 ml/min  Conductivity: Machine  14.3 14 14   HD Safety Checks Performed Yes Yes Yes  Dialysis Fluid Bolus --  --  --   Bolus Amount (mL) --  --  --   Dialysate Change --  --  --   Intra-Hemodialysis Comments Progressing as prescribed Progressing as prescribed Progressing as prescribed  Note  Observations --  --  --

## 2017-03-04 NOTE — Progress Notes (Signed)
Third HD treatment 03/02/2017   03/02/17 1355 03/02/17 1400 03/02/17 1430  During Hemodialysis Assessment  Blood Flow Rate (mL/min) 300 mL/min 300 mL/min 300 mL/min  Arterial Pressure (mmHg) -140 mmHg -130 mmHg -150 mmHg  Venous Pressure (mmHg) 170 mmHg 140 mmHg 140 mmHg  Transmembrane Pressure (mmHg) 40 mmHg 40 mmHg 50 mmHg  Ultrafiltration Rate (mL/min) 500 mL/min 500 mL/min 500 mL/min  Dialysate Flow Rate (mL/min) 600 ml/min 600 ml/min 600 ml/min  Conductivity: Machine  14 14 14   HD Safety Checks Performed Yes Yes Yes  Dialysis Fluid Bolus Normal Saline --  --   Bolus Amount (mL) 250 mL --  --   Intra-Hemodialysis Comments Tx initiated Progressing as prescribed Progressing as prescribed     03/02/17 1500 03/02/17 1530 03/02/17 1600  During Hemodialysis Assessment  Blood Flow Rate (mL/min) 300 mL/min 300 mL/min 300 mL/min  Arterial Pressure (mmHg) 150 mmHg -170 mmHg -230 mmHg  Venous Pressure (mmHg) 140 mmHg 140 mmHg 130 mmHg  Transmembrane Pressure (mmHg) 50 mmHg 50 mmHg 50 mmHg  Ultrafiltration Rate (mL/min) 500 mL/min 500 mL/min 680 mL/min  Dialysate Flow Rate (mL/min) 600 ml/min 600 ml/min 600 ml/min  Conductivity: Machine  14 14 14   HD Safety Checks Performed Yes Yes Yes  Dialysis Fluid Bolus --  --  Meds  Bolus Amount (mL) --  --  200 mL  Intra-Hemodialysis Comments Progressing as prescribed Progressing as prescribed Progressing as prescribed     03/02/17 1630  During Hemodialysis Assessment  Blood Flow Rate (mL/min) 300 mL/min  Arterial Pressure (mmHg) -220 mmHg  Venous Pressure (mmHg) 130 mmHg  Transmembrane Pressure (mmHg) 40 mmHg  Ultrafiltration Rate (mL/min) 680 mL/min  Dialysate Flow Rate (mL/min) 600 ml/min  Conductivity: Machine  14  HD Safety Checks Performed Yes  Dialysis Fluid Bolus --   Bolus Amount (mL) --   Intra-Hemodialysis Comments Progressing as prescribed

## 2017-03-04 NOTE — Progress Notes (Signed)
OT Cancellation Note  Patient Details Name: REZA CRYMES MRN: [id number] DOB: 1961-01-13   Cancelled Treatment:    Reason Eval/Treat Not Completed: Patient at procedure or test/ unavailable (Pt. at Dialysis)  Harrel Carina, MS, OTR/L 03/04/2017, 12:16 PM

## 2017-03-04 NOTE — Progress Notes (Signed)
[place] [facility] [place] NAME: Marky Buresh    MR#:  [id number]  DATE OF BIRTH:  03/13/1961  SUBJECTIVE:   Patient came in with foul smelling ulcer and drainage with the right foot. Patient seen at hemodialysis. He is status post right foot incision and drainage with debridement of the abscess. REVIEW OF SYSTEMS:   Review of Systems  Constitutional: Negative for chills, fever and weight loss.  HENT: Negative for ear discharge, ear pain and nosebleeds.   Eyes: Negative for blurred vision, pain and discharge.  Respiratory: Negative for sputum production, shortness of breath, wheezing and stridor.   Cardiovascular: Positive for leg swelling. Negative for chest pain, palpitations, orthopnea and PND.  Gastrointestinal: Negative for abdominal pain, diarrhea, nausea and vomiting.  Genitourinary: Negative for frequency and urgency.  Musculoskeletal: Negative for back pain and joint pain.  Neurological: Positive for weakness. Negative for sensory change, speech change and focal weakness.  Psychiatric/Behavioral: Negative for depression and hallucinations. The patient is not nervous/anxious.    Tolerating Diet:yes Tolerating PT: pending  DRUG ALLERGIES:   Allergies  Allergen Reactions  . Codeine Nausea And Vomiting    VITALS:  Blood pressure (!) 128/45, pulse 91, temperature 98.9 F (37.2 C), temperature source Oral, resp. rate 18, height 5\' 10"  (1.778 m), weight (!) 151.8 kg (334 lb 10.5 oz), SpO2 98 %.  PHYSICAL EXAMINATION:   Physical Exam  GENERAL:  56 y.o.-year-old patient lying in the bed with no acute distress. obese EYES: Pupils equal, round, reactive to light and accommodation. No scleral icterus. Extraocular muscles intact.  HEENT: Head atraumatic, normocephalic. Oropharynx and nasopharynx clear.  NECK:  Supple, no jugular venous distention. No thyroid enlargement, no tenderness.  LUNGS: Normal breath sounds bilaterally, no  wheezing, rales, rhonchi. No use of accessory muscles of respiration.  CARDIOVASCULAR: S1, S2 normal. No murmurs, rubs, or gallops.  ABDOMEN: Soft, nontender, nondistended. Bowel sounds present. No organomegaly or mass.  EXTREMITIES: Surgical dressing with wound VAC NEUROLOGIC: Cranial nerves II through XII are intact. No focal Motor or sensory deficits b/l.   PSYCHIATRIC:  patient is alert and verbal commands but sleepy secondary to recent sedation SKIN: No obvious rash, lesion, or ulcer.   LABORATORY PANEL:  CBC  Recent Labs Lab 03/02/17 0845  WBC 8.3  HGB 8.8*  HCT 26.2*  PLT 315    Chemistries   Recent Labs Lab 02/27/17 1718  03/02/17 0845  NA 134*  < > 139  K 5.3*  < > 4.1  CL 99*  < > 101  CO2 22  < > 29  GLUCOSE 251*  < > 231*  BUN 72*  < > 37*  CREATININE 8.24*  < > 5.73*  CALCIUM 8.6*  < > 8.0*  AST 17  --   --   ALT 17  --   --   ALKPHOS 129*  --   --   BILITOT 0.3  --   --   < > = values in this interval not displayed. Cardiac Enzymes No results for input(s): TROPONINI in the last 168 hours. RADIOLOGY:  No results found. ASSESSMENT AND PLAN:   Angad Nabers  is a 56 y.o. male with a known history of Chronic kidney disease, diabetes, high cholesterol, hypertension- had a fall 6 weeks ago and broke his elbow, went to his primary care doctor's office, x-rays showed Comminuted fracture and was advised to go to a tertiary care center and special. [name].  *  Right diabetic foot abscess/Cellulitis/ulcer  With foul smelling ulcers and severe edema on right foot, possible osteomyelitis - IV vancomycin and Zosyn---now on IV ampicillin as per [facility]  -Podiatry consult appreciated. Patient is status post I and D of abscess and debridement. Will need wound vac for several days -Infectious disease consultation noted  * Acute on chronic renal failure, CKD stage V--- now ESRD - Nephrology consulted.  -Patient started on hemodialysis this hospitalization - hold  nephrotoxic medication. -Temporary cath  placed on May 24 by Dr Lucky Cowboy [facility] cath  * Hypertension Amlodipine and hydralazine  * Diabetes--poorly uncontrolled   Insulin sliding scale coverage and Lantus. Seen by Diabetes coordinator Pt has very poorly controlled DM  * Anemia   Likely due to chronic kidney disease.  Case discussed with Care Management/Social Worker. Management plans discussed with the patient, family and they are in agreement.  CODE STATUS: FULL  DVT Prophylaxis: Heparin  TOTAL TIME TAKING CARE OF THIS PATIENT: *30* minutes.  >50% time spent on counselling and coordination of care  POSSIBLE D/C IN 3-4DAYS, DEPENDING ON CLINICAL CONDITION.  Note: This dictation was prepared with Dragon dictation along with smaller phrase technology. Any transcriptional errors that result from this process are unintentional.  Aayliah Rotenberry M.D on 03/04/2017 at 2:39 PM  Between 7am to 6pm - Pager - [phone]  After 6pm go to [email or web address] - password [email or web address] [facility]  Office  [phone]  CC: Primary care physician; [name], MD

## 2017-03-05 ENCOUNTER — Encounter: Payer: Self-pay | Admitting: *Deleted

## 2017-03-05 ENCOUNTER — Encounter
Admission: EM | Disposition: A | Discharge status: Home Health | Payer: Self-pay | Source: Home / Self Care | Attending: Internal Medicine

## 2017-03-05 DIAGNOSIS — N186 End stage renal disease: Secondary | ICD-10-CM

## 2017-03-05 HISTORY — PX: DIALYSIS/PERMA CATHETER INSERTION: CATH118288

## 2017-03-05 LAB — RENAL FUNCTION PANEL
Albumin: 2.2 g/dL — ABNORMAL LOW (ref 3.5–5.0)
Anion gap: 9 (ref 5–15)
BUN: 25 mg/dL — ABNORMAL HIGH (ref 6–20)
CHLORIDE: 102 mmol/L (ref 101–111)
CO2: 28 mmol/L (ref 22–32)
Calcium: 8.2 mg/dL — ABNORMAL LOW (ref 8.9–10.3)
Creatinine, Ser: 4.89 mg/dL — ABNORMAL HIGH (ref 0.61–1.24)
GFR calc non Af Amer: 12 mL/min — ABNORMAL LOW (ref >60)
GFR, EST AFRICAN AMERICAN: 14 mL/min — AB (ref >60)
Glucose, Bld: 213 mg/dL — ABNORMAL HIGH (ref 65–99)
Phosphorus: 4.4 mg/dL (ref 2.5–4.6)
Potassium: 3.7 mmol/L (ref 3.5–5.1)
Sodium: 139 mmol/L (ref 135–145)

## 2017-03-05 LAB — GLUCOSE, CAPILLARY
GLUCOSE-CAPILLARY: 142 mg/dL — AB (ref 65–99)
GLUCOSE-CAPILLARY: 126 mg/dL — AB (ref 65–99)
GLUCOSE-CAPILLARY: 225 mg/dL — AB (ref 65–99)
Glucose-Capillary: 140 mg/dL — ABNORMAL HIGH (ref 65–99)
Glucose-Capillary: 193 mg/dL — ABNORMAL HIGH (ref 65–99)
Glucose-Capillary: 145 mg/dL — ABNORMAL HIGH (ref 65–99)

## 2017-03-05 LAB — CBC
HEMATOCRIT: 27 % — AB (ref 40.0–52.0)
Hemoglobin: 9 g/dL — ABNORMAL LOW (ref 13.0–18.0)
MCH: 28.4 pg (ref 26.0–34.0)
MCHC: 33.1 g/dL (ref 32.0–36.0)
MCV: 85.7 fL (ref 80.0–100.0)
Platelets: 295 10*3/uL (ref 150–440)
RBC: 3.16 MIL/uL — AB (ref 4.40–5.90)
RDW: 13.3 % (ref 11.5–14.5)
WBC: 8.3 10*3/uL (ref 3.8–10.6)

## 2017-03-05 SURGERY — DIALYSIS/PERMA CATHETER INSERTION
Anesthesia: Moderate Sedation

## 2017-03-05 MED ORDER — HEPARIN SODIUM (PORCINE) 10000 UNIT/ML IJ SOLN
INTRAMUSCULAR | Status: AC
  Filled 2017-03-05: qty 1

## 2017-03-05 MED ORDER — SODIUM CHLORIDE 0.9 % IV SOLN
3.0000 g | Freq: Two times a day (BID) | INTRAVENOUS | Status: DC
  Administered 2017-03-06 – 2017-03-07 (×4): 3 g via INTRAVENOUS
  Filled 2017-03-05 (×6): qty 3

## 2017-03-05 MED ORDER — MIDAZOLAM HCL 2 MG/2ML IJ SOLN
INTRAMUSCULAR | Status: DC | PRN
  Administered 2017-03-05 (×2): 2 mg via INTRAVENOUS
  Administered 2017-03-05: 1 mg via INTRAVENOUS

## 2017-03-05 MED ORDER — LIDOCAINE-EPINEPHRINE (PF) 2 %-1:200000 IJ SOLN
INTRAMUSCULAR | Status: AC
Start: 2017-03-05 — End: 2017-03-05
  Filled 2017-03-05: qty 20

## 2017-03-05 MED ORDER — FENTANYL CITRATE (PF) 100 MCG/2ML IJ SOLN
25.0000 ug | INTRAMUSCULAR | Status: DC | PRN
  Administered 2017-03-05 (×2): 50 ug via INTRAVENOUS

## 2017-03-05 MED ORDER — FENTANYL CITRATE (PF) 100 MCG/2ML IJ SOLN
INTRAMUSCULAR | Status: AC
  Filled 2017-03-05: qty 2

## 2017-03-05 MED ORDER — ACETAMINOPHEN 325 MG PO TABS: 650.0000 mg | ORAL_TABLET | Freq: Four times a day (QID) | ORAL | Status: DC | PRN

## 2017-03-05 MED ORDER — MIDAZOLAM HCL 5 MG/5ML IJ SOLN
INTRAMUSCULAR | Status: AC
  Filled 2017-03-05: qty 5

## 2017-03-05 SURGICAL SUPPLY — 10 items
ADH SKN CLS APL DERMABOND .7 (GAUZE/BANDAGES/DRESSINGS) ×1
CATH PALINDROME RT-P 15FX19CM (CATHETERS) ×1 IMPLANT
DERMABOND ADVANCED (GAUZE/BANDAGES/DRESSINGS) ×1
DERMABOND ADVANCED .7 DNX12 (GAUZE/BANDAGES/DRESSINGS) IMPLANT
DRAPE INCISE IOBAN 66X45 STRL (DRAPES) ×1 IMPLANT
NDL ENTRY 21GA 7CM ECHOTIP (NEEDLE) IMPLANT
NEEDLE ENTRY 21GA 7CM ECHOTIP (NEEDLE) ×2 IMPLANT
PACK ANGIOGRAPHY (CUSTOM PROCEDURE TRAY) ×1 IMPLANT
SET INTRO CAPELLA COAXIAL (SET/KITS/TRAYS/PACK) ×1 IMPLANT
SUT MNCRL AB 4-0 PS2 18 (SUTURE) ×1 IMPLANT

## 2017-03-05 NOTE — Progress Notes (Signed)
[facility]  ROUNDING NOTE   Subjective:   Patient seen during dialysis Tolerating well   HEMODIALYSIS FLOWSHEET:  Blood Flow Rate (mL/min): 325 mL/min Arterial Pressure (mmHg): -140 mmHg Venous Pressure (mmHg): 260 mmHg Transmembrane Pressure (mmHg): 60 mmHg Ultrafiltration Rate (mL/min): 1000 mL/min Dialysate Flow Rate (mL/min): 600 ml/min Conductivity: Machine : 15.2 Conductivity: Machine : 15.2 Dialysis Fluid Bolus: Normal Saline Bolus Amount (mL): 100 mL Dialysate Change:  (3k)  New permcath placed today  Objective:  Vital signs in last 24 hours:  Temp:  [98.1 F (36.7 C)-99.3 F (37.4 C)] 99.3 F (37.4 C) (05/29 1350) Pulse Rate:  [86-100] 100 (05/29 1530) Resp:  [14-24] 20 (05/29 1530) BP: (130-172)/(81-110) 130/83 (05/29 1530) SpO2:  [91 %-100 %] 91 % (05/29 1530) Weight:  [151.1 kg (333 lb 1.8 oz)-151.5 kg (334 lb)] 151.1 kg (333 lb 1.8 oz) (05/29 1350)  Weight change:  Filed Weights   03/04/17 1353 03/05/17 0925 03/05/17 1350  Weight: (!) 151.8 kg (334 lb 10.5 oz) (!) 151.5 kg (334 lb) (!) 151.1 kg (333 lb 1.8 oz)    Intake/Output: I/O last 3 completed shifts: In: 600 [P.O.:600] Out: 0539 [Urine:1400; Drains:100; Other:2920]   Intake/Output this shift:  Total I/O In: -  Out: 700 [Urine:700]  Physical Exam: General: NAD, laying in bed  Head: Normocephalic, atraumatic. Moist oral mucosal membranes  Eyes: Anicteric,    Neck: Supple, trachea midline  Lungs:  Clear to auscultation  Heart: Regular rate and rhythm  Abdomen:  Soft, nontender, obese  Extremities: + peripheral edema, right foot wound vac, clean dry intact dressings  Neurologic: Nonfocal, moving all four extremities  Skin: No lesions  Access: IJ Permcath    Basic Metabolic Panel:  Recent Labs Lab 02/27/17 1718 02/28/17 0443 02/28/17 1030 03/01/17 0446 03/01/17 1254 03/02/17 0845 03/05/17 1400  NA 134* 135  --   --  138 139 139  K 5.3* 4.8  --  4.6 4.0 4.1 3.7   CL 99* 101  --   --  102 101 102  CO2 22 24  --   --  28 29 28   GLUCOSE 251* 291*  --   --  190* 231* 213*  BUN 72* 71*  --   --  44* 37* 25*  CREATININE 8.24* 8.03*  --   --  6.03* 5.73* 4.89*  CALCIUM 8.6* 8.1*  --   --  7.8* 8.0* 8.2*  PHOS  --   --  6.4*  --  5.4* 5.1* 4.4    Liver Function Tests:  Recent Labs Lab 02/27/17 1718 03/01/17 1254 03/02/17 0845 03/05/17 1400  AST 17  --   --   --   ALT 17  --   --   --   ALKPHOS 129*  --   --   --   BILITOT 0.3  --   --   --   PROT 7.6  --   --   --   ALBUMIN 2.6* 2.1* 2.2* 2.2*   No results for input(s): LIPASE, AMYLASE in the last 168 hours. No results for input(s): AMMONIA in the last 168 hours.  CBC:  Recent Labs Lab 02/27/17 1718 02/28/17 0443 03/01/17 1254 03/02/17 0845 03/05/17 1400  WBC 11.1* 6.7 6.0 8.3 8.3  NEUTROABS 9.2*  --   --   --   --   HGB 9.4* 9.4* 8.5* 8.8* 9.0*  HCT 28.1* 27.7* 25.2* 26.2* 27.0*  MCV 86.1 86.5 86.5 85.3 85.7  PLT  337 332 302 315 295    Cardiac Enzymes: No results for input(s): CKTOTAL, CKMB, CKMBINDEX, TROPONINI in the last 168 hours.  BNP: Invalid input(s): POCBNP  CBG:  Recent Labs Lab 03/04/17 2121 03/05/17 0720 03/05/17 0945 03/05/17 1046 03/05/17 1157  GLUCAP 223* 142* 126* 140* 193*    Microbiology: Results for orders placed or performed during the hospital encounter of 02/27/17  Blood culture (routine x 2)     Status: None   Collection Time: 02/27/17  7:09 PM  Result Value Ref Range Status   Specimen Description BLOOD RIGHT HAND  Final   Special Requests Blood Culture adequate volume  Final   Culture NO GROWTH 5 DAYS  Final   Report Status 03/04/2017 FINAL  Final  Blood culture (routine x 2)     Status: None   Collection Time: 02/27/17  7:09 PM  Result Value Ref Range Status   Specimen Description BLOOD RIGHT [facility]  Final   Special Requests Blood Culture adequate volume  Final   Culture NO GROWTH 5 DAYS  Final   Report Status 03/04/2017 FINAL  Final   Aerobic/Anaerobic Culture (surgical/deep wound)     Status: None (Preliminary result)   Collection Time: 02/28/17  3:30 PM  Result Value Ref Range Status   Specimen Description FOOT  Final   Special Requests Immunocompromised  Final   Gram Stain   Final    MODERATE WBC PRESENT,BOTH PMN AND MONONUCLEAR ABUNDANT GRAM NEGATIVE RODS ABUNDANT GRAM POSITIVE COCCI FEW GRAM POSITIVE RODS    Culture   Final    ABUNDANT STREPTOCOCCUS GROUP G ABUNDANT ENTEROCOCCUS FAECALIS HOLDING FOR POSSIBLE ANAEROBE Performed at [facility], [place] [address] [place], [place] [address]    Report Status PENDING  Incomplete   Organism ID, Bacteria ENTEROCOCCUS FAECALIS  Final      Susceptibility   Enterococcus faecalis - MIC*    AMPICILLIN <=2 SENSITIVE Sensitive     VANCOMYCIN 1 SENSITIVE Sensitive     GENTAMICIN SYNERGY SENSITIVE Sensitive     * ABUNDANT ENTEROCOCCUS FAECALIS  Surgical PCR screen     Status: Abnormal   Collection Time: 02/28/17  5:00 PM  Result Value Ref Range Status   MRSA, PCR NEGATIVE NEGATIVE Final   Staphylococcus aureus POSITIVE (A) NEGATIVE Final    Comment:        The Xpert SA Assay (FDA approved for NASAL specimens in patients over 75 years of age), is one component of a comprehensive surveillance program.  Test performance has been validated by [facility] for patients greater than or equal to 68 year old. It is not intended to diagnose infection nor to guide or monitor treatment.   Culture, blood (routine x 2)     Status: None (Preliminary result)   Collection Time: 03/02/17  8:45 AM  Result Value Ref Range Status   Specimen Description BLOOD RIGHT ASSIST CONTROL  Final   Special Requests   Final    BOTTLES DRAWN AEROBIC AND ANAEROBIC Blood Culture results may not be optimal due to an excessive volume of blood received in culture bottles   Culture NO GROWTH 3 DAYS  Final   Report Status PENDING  Incomplete  Culture, blood (routine x 2)     Status: None  (Preliminary result)   Collection Time: 03/02/17  9:11 AM  Result Value Ref Range Status   Specimen Description BLOOD RIGHT ASSIST CONTROL  Final   Special Requests   Final    BOTTLES DRAWN  AEROBIC AND ANAEROBIC Blood Culture adequate volume   Culture NO GROWTH 3 DAYS  Final   Report Status PENDING  Incomplete    Coagulation Studies: No results for input(s): LABPROT, INR in the last 72 hours.  Urinalysis: No results for input(s): COLORURINE, LABSPEC, PHURINE, GLUCOSEU, HGBUR, BILIRUBINUR, KETONESUR, PROTEINUR, UROBILINOGEN, NITRITE, LEUKOCYTESUR in the last 72 hours.  Invalid input(s): APPERANCEUR    Imaging: No results found.   Medications:   . ampicillin (OMNIPEN) IV Stopped (03/04/17 1720)   . amLODipine  10 mg Oral Daily  . aspirin EC  81 mg Oral Daily  . Chlorhexidine Gluconate Cloth  6 each Topical Daily  . epoetin (EPOGEN/PROCRIT) injection  10,000 Units Intravenous Q M,W,F-HD  . gabapentin  300 mg Oral BID  . heparin  5,000 Units Subcutaneous Q8H  . hydrALAZINE  50 mg Oral Q8H  . insulin aspart  0-15 Units Subcutaneous TID WC  . insulin glargine  30 Units Subcutaneous Daily  . living well with diabetes book   Does not apply Once  . metoprolol tartrate  25 mg Oral BID   acetaminophen, docusate sodium, oxyCODONE-acetaminophen  Assessment/ Plan:  [name] is a 55 y.o. white male  with hypertension, diabetes mellitus type 2 insulin dependent, hyperlipidemia, GERD, and morbid obesity  1. 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 No biopsy.  Hyperkalemia on admission Initiated on hemodialysis on this admission. First dialysis was 5/24. Tolerated treatment well.  - PPD negative 5/26.  Seen and examined on hemodialysis  - new perncath placed today - d/c planning in progress - vein mapping  2. Hypertension: blood pressure at goal. Currently holding lisinopril.  - amlodipine,  hydralazine - restarted on metoprolol. (patient was originally taking both carvedilol and metoprolol)  3. Anemia of chronic kidney disease: hemoglobin 9.0 - EPO with Hemodialysis treatment   4. Secondary Hyperparathyroidism: calcium at goal. Not currently on vitamin D. PTH 121.   - low phos diet.   5. Diabetes mellitus type II with chronic kidney disease: hemoglobin V8P 9.2% complicated with with diabetic foot ulcer right foot status post I&D 5/25 Dr. Elvina Mattes. Wound vac - Appreciate ID and podiatry input.  - Continue glucose control.    6. Left olecrenon fracture: working with OT - Appreciate ortho input. Patient will need to see an elbow specialist.     LOS: 6 Prestyn Stanco 5/29/20184:16 PM

## 2017-03-05 NOTE — Progress Notes (Signed)
Hd start 

## 2017-03-05 NOTE — Progress Notes (Signed)
  End of hd 

## 2017-03-05 NOTE — Progress Notes (Signed)
Pt hd tx ended 30 minutes early due to high tmp and ap. Goal not met due to hypotension during HD. Post HD pt alert, no c/o, stable, report to primary RN.

## 2017-03-05 NOTE — Progress Notes (Signed)
Pre hd assessment  

## 2017-03-05 NOTE — Progress Notes (Addendum)
Incorrect note

## 2017-03-05 NOTE — Op Note (Signed)
OPERATIVE NOTE   PROCEDURE: 1. Insertion of tunneled dialysis catheter right IJ approach with ultrasound and fluoroscopic guidance.  PRE-OPERATIVE DIAGNOSIS: End stage renal disease  POST-OPERATIVE DIAGNOSIS: Same  SURGEON: Hortencia Pilar.  ANESTHESIA: Conscious sedation was administered under my direct supervision by the interventional radiology RN. IV Versed plus fentanyl were utilized. Continuous ECG, pulse oximetry and blood pressure was monitored throughout the entire procedure. Conscious sedation was for a total of 23 minutes.  ESTIMATED BLOOD LOSS: Minimal cc  CONTRAST USED:  None  FLUOROSCOPY TIME:  0.8 minutes  INDICATIONS:   Steven [name] a 56 y.o. y.o. male who presents with new dialysis start.  DESCRIPTION: After obtaining full informed written consent, the patient was positioned supine. The right neck and chest  was prepped and draped in a sterile fashion. Ultrasound was placed in a sterile sleeve. Ultrasound was utilized to identify the right IJ vein which is noted to be echolucent and compressible indicating patency. Image is recorded for the permanent record. Under direct ultrasound visualization a micro-needle is inserted into the vein followed by the micro-wire. Micro-sheath was then advanced and a J wire is inserted without difficulty under fluoroscopic guidance. Small counterincision was made at the wire insertion site. Dilators are passed over the wire and the tunneled dialysis catheter is fed into the central venous system without difficulty.  Under fluoroscopy the catheter tip positioned at the atrial caval junction. The catheter is then approximated to the chest wall and an exit site selected. 1% lidocaine is infiltrated in soft tissues at this level small incision is made and the tunneling device is then passed from the exit site to the neck counterincision. Catheter is then connected to the tunneling device and the catheter was pulled subcutaneously. It is  then transected and the hub assembly connected without difficulty. Both lumens aspirate and flush easily. After verification of smooth contour with proper tip position under fluoroscopy the catheter is packed with 5000 units of heparin per lumen.  Catheter secured to the skin of the right chest with 0 silk. A sterile dressing is applied with a Biopatch.  COMPLICATIONS: None  CONDITION: Good  Hortencia Pilar [place] [facility]. Office:  [phone]   03/05/2017,10:38 AM

## 2017-03-05 NOTE — Progress Notes (Signed)
OT Cancellation Note  Patient Details Name: Steven [name] MRN: [id number] DOB: 02-Dec-1960   Cancelled Treatment:    Reason Eval/Treat Not Completed: Patient at procedure or test/ unavailable. Pt currently off unit for perm cath placement. Pt unavailable for OT treatment intervention. Depending on type of anesthesia used, may need new orders for therapy interventions. Will re-attempt next date.  Jeni Salles, MPH, MS, OTR/L ascom [phone] 03/05/17, 10:13 AM

## 2017-03-05 NOTE — Progress Notes (Signed)
ANTIBIOTIC CONSULT NOTE - INITIAL  Pharmacy Consult for Unasyn  Indication: wound infection  Allergies  Allergen Reactions  . Codeine Nausea And Vomiting    Patient Measurements: Height: 5\' 10"  (177.8 cm) Weight: (!) 328 lb 11.3 oz (149.1 kg) IBW/kg (Calculated) : 73 Adjusted Body Weight:   Vital Signs: Temp: 98.4 F (36.9 C) (05/29 2027) Temp Source: Oral (05/29 2027) BP: 143/76 (05/29 2027) Pulse Rate: 100 (05/29 2027) Intake/Output from previous day: 05/28 0701 - 05/29 0700 In: 600 [P.O.:600] Out: 4020 [Urine:1000; Drains:100] Intake/Output from this shift: No intake/output data recorded.  Labs:  Recent Labs  03/05/17 1400  WBC 8.3  HGB 9.0*  PLT 295  CREATININE 4.89*   Estimated Creatinine Clearance: 25 mL/min (A) (by C-G formula based on SCr of 4.89 mg/dL (H)). No results for input(s): VANCOTROUGH, VANCOPEAK, VANCORANDOM, GENTTROUGH, GENTPEAK, GENTRANDOM, TOBRATROUGH, TOBRAPEAK, TOBRARND, AMIKACINPEAK, AMIKACINTROU, AMIKACIN in the last 72 hours.   Microbiology: Recent Results (from the past 720 hour(s))  Blood culture (routine x 2)     Status: None   Collection Time: 02/27/17  7:09 PM  Result Value Ref Range Status   Specimen Description BLOOD RIGHT HAND  Final   Special Requests Blood Culture adequate volume  Final   Culture NO GROWTH 5 DAYS  Final   Report Status 03/04/2017 FINAL  Final  Blood culture (routine x 2)     Status: None   Collection Time: 02/27/17  7:09 PM  Result Value Ref Range Status   Specimen Description BLOOD RIGHT [facility]  Final   Special Requests Blood Culture adequate volume  Final   Culture NO GROWTH 5 DAYS  Final   Report Status 03/04/2017 FINAL  Final  Aerobic/Anaerobic Culture (surgical/deep wound)     Status: None (Preliminary result)   Collection Time: 02/28/17  3:30 PM  Result Value Ref Range Status   Specimen Description FOOT  Final   Special Requests Immunocompromised  Final   Gram Stain   Final    MODERATE WBC  PRESENT,BOTH PMN AND MONONUCLEAR ABUNDANT GRAM NEGATIVE RODS ABUNDANT GRAM POSITIVE COCCI FEW GRAM POSITIVE RODS    Culture   Final    ABUNDANT STREPTOCOCCUS GROUP G ABUNDANT ENTEROCOCCUS FAECALIS HOLDING FOR POSSIBLE ANAEROBE Performed at [facility], [place] [address] [place], [place] [address]    Report Status PENDING  Incomplete   Organism ID, Bacteria ENTEROCOCCUS FAECALIS  Final      Susceptibility   Enterococcus faecalis - MIC*    AMPICILLIN <=2 SENSITIVE Sensitive     VANCOMYCIN 1 SENSITIVE Sensitive     GENTAMICIN SYNERGY SENSITIVE Sensitive     * ABUNDANT ENTEROCOCCUS FAECALIS  Surgical PCR screen     Status: Abnormal   Collection Time: 02/28/17  5:00 PM  Result Value Ref Range Status   MRSA, PCR NEGATIVE NEGATIVE Final   Staphylococcus aureus POSITIVE (A) NEGATIVE Final    Comment:        The Xpert SA Assay (FDA approved for NASAL specimens in patients over 47 years of age), is one component of a comprehensive surveillance program.  Test performance has been validated by [facility] for patients greater than or equal to 62 year old. It is not intended to diagnose infection nor to guide or monitor treatment.   Culture, blood (routine x 2)     Status: None (Preliminary result)   Collection Time: 03/02/17  8:45 AM  Result Value Ref Range Status   Specimen Description BLOOD RIGHT ASSIST CONTROL  Final   Special Requests   Final    BOTTLES DRAWN AEROBIC AND ANAEROBIC Blood Culture results may not be optimal due to an excessive volume of blood received in culture bottles   Culture NO GROWTH 3 DAYS  Final   Report Status PENDING  Incomplete  Culture, blood (routine x 2)     Status: None (Preliminary result)   Collection Time: 03/02/17  9:11 AM  Result Value Ref Range Status   Specimen Description BLOOD RIGHT ASSIST CONTROL  Final   Special Requests   Final    BOTTLES DRAWN AEROBIC AND ANAEROBIC Blood Culture adequate volume   Culture NO GROWTH 3 DAYS  Final    Report Status PENDING  Incomplete    Medical History: Past Medical History:  Diagnosis Date  . Cellulitis and abscess of right lower extremity 03/01/2017  . Chronic kidney disease   . Diabetes mellitus without complication ([place])   . Edema extremities 03/01/2017   bilateral swelling  . High cholesterol   . High triglycerides   . Hypertension   . Sleep apnea    can't use cpap d/t feelings of suffocation    Medications:  Prescriptions Prior to Admission  Medication Sig Dispense Refill Last Dose  . amLODipine (NORVASC) 10 MG tablet TAKE ONE TABLET BY MOUTH ONCE DAILY --  **NEEDS  VISIT  WITH  PRIMARY  CARE  PROVIDER  BEFORE  REFILLS  GIVEN** 30 tablet 0 Past Week at 1100  . aspirin EC 81 MG tablet TAKE ONE  BY MOUTH EVERY DAY 30 tablet 10 Past Week at 1100  . furosemide (LASIX) 20 MG tablet TAKE TWO TABLETS BY MOUTH TWICE DAILY AS NEEDED FOR EDEMA (Patient taking differently: No sig reported) 180 tablet 0 02/27/2017 at 1100  . glucose blood (ACCU-CHEK AVIVA) test strip Use as instructed.use to check blood sugar up to 3 times a day  Disp: 1 box 100 each 11 03/05/2017 at Unknown time  . HYDRALAZINE HCL PO Take 50 mg by mouth 3 (three) times daily.    03/04/2017 at 1100  . HYDROcodone-acetaminophen (NORCO/VICODIN) 5-325 MG tablet One tablet every four hours as needed for acute pain.  Limit of five days per [place]. 30 tablet 0 Past Month at Unknown time  . ibuprofen (ADVIL,MOTRIN) 800 MG tablet Take 1 tablet (800 mg total) by mouth every 8 (eight) hours as needed. 90 tablet 1 Past Month at prn  . insulin aspart protamine- aspart (NOVOLOG MIX 70/30) (70-30) 100 UNIT/ML injection Inject 40-150 Units into the skin 2 (two) times daily with a meal.   03/05/2017 at 1800  . lisinopril (PRINIVIL,ZESTRIL) 40 MG tablet Take 40 mg by mouth daily.    02/27/2017 at 1100  . insulin lispro (HUMALOG) 100 UNIT/ML injection Inject 0.3 mLs (30 Units total) into the skin 3 (three) times daily before meals.  (Patient not taking: Reported on 02/27/2017) 3 vial 0 Not Taking at Unknown time  . Insulin Pen Needle 31G X 8 MM MISC 1 Container by Does not apply route QID. qid--any brand 100 each 11 Taking  . Insulin Syringes, Disposable, U-100 1 ML MISC Use as directed with insulin 100 each 3 Taking  . Lancets (ACCU-CHEK MULTICLIX) lancets Use as instructed.  check blood sugar up to 3 times a day Disp:  1 box 100 each 11 Taking  . lisinopril-hydrochlorothiazide (PRINZIDE,ZESTORETIC) 20-25 MG per tablet TAKE ONE TABLET BY MOUTH ONCE DAILY (Patient not taking: Reported on 02/27/2017) 30 tablet 0 Not Taking  at Unknown time  . oxyCODONE-acetaminophen (PERCOCET/ROXICET) 5-325 MG tablet Take 2 tablets by mouth every 4 (four) hours as needed for severe pain. 10 tablet 0 prn at prn   Assessment: CrCl = 25 ml/min   Goal of Therapy:  resolution of infection  Plan:  Expected duration 7 days with resolution of temperature and/or normalization of WBC   Unasyn 3 gm IV Q12H ordered to start on  5/29 @ 22:00.   Adriaan Maltese D 03/05/2017,9:08 PM

## 2017-03-05 NOTE — Progress Notes (Signed)
Pre hd info 

## 2017-03-05 NOTE — Progress Notes (Signed)
PT Cancellation Note  Patient Details Name: Steven [name] MRN: [id number] DOB: Jun 09, 1961   Cancelled Treatment:    Reason Eval/Treat Not Completed: Other (comment). Consult received and chart reviewed. Pt currently off unit for perm cath placement. Unable to perform therapy intervention. Depending on type of anesthesia used, may need new orders for therapy interventions. Will re-attempt next date.   Xandrea Clarey 03/05/2017, 10:09 AM  [name], PT, DPT [phone]

## 2017-03-05 NOTE — Progress Notes (Signed)
[facility] INFECTIOUS DISEASE PROGRESS NOTE Date of Admission:  02/27/2017     ID: Steven [name] is a 56 y.o. male with ESRD, DM, R DM foot infection Principal Problem:   Cellulitis in diabetic foot ([place]) Active Problems:   Acute on chronic renal failure (HCC)   Cellulitis  Subjective: HD cath placed. No fevers,   ROS  Eleven systems are reviewed and negative except per hpi  Medications:  Antibiotics Given (last 72 hours)    Date/Time Action Medication Dose Rate   03/02/17 1552 New Bag/Given  [given during dialysis]   vancomycin (VANCOCIN) IVPB 1000 mg/200 mL premix 1,000 mg 200 mL/hr   03/02/17 2110 New Bag/Given   piperacillin-tazobactam (ZOSYN) IVPB 3.375 g 3.375 g 12.5 mL/hr   03/03/17 0910 New Bag/Given   piperacillin-tazobactam (ZOSYN) IVPB 3.375 g 3.375 g 12.5 mL/hr   03/03/17 2157 New Bag/Given   piperacillin-tazobactam (ZOSYN) IVPB 3.375 g 3.375 g 12.5 mL/hr   03/04/17 1448 New Bag/Given   ampicillin (OMNIPEN) 2 g in sodium chloride 0.9 % 50 mL IVPB 2 g 150 mL/hr     . amLODipine  10 mg Oral Daily  . aspirin EC  81 mg Oral Daily  . Chlorhexidine Gluconate Cloth  6 each Topical Daily  . epoetin (EPOGEN/PROCRIT) injection  10,000 Units Intravenous Q M,W,F-HD  . gabapentin  300 mg Oral BID  . heparin  5,000 Units Subcutaneous Q8H  . hydrALAZINE  50 mg Oral Q8H  . insulin aspart  0-15 Units Subcutaneous TID WC  . insulin glargine  30 Units Subcutaneous Daily  . living well with diabetes book   Does not apply Once  . metoprolol tartrate  25 mg Oral BID    Objective: Vital signs in last 24 hours: Temp:  [98.1 F (36.7 C)-99.3 F (37.4 C)] 99.3 F (37.4 C) (05/29 1350) Pulse Rate:  [86-100] 100 (05/29 1530) Resp:  [14-24] 20 (05/29 1530) BP: (130-172)/(81-110) 130/83 (05/29 1530) SpO2:  [91 %-100 %] 91 % (05/29 1530) Weight:  [151.1 kg (333 lb 1.8 oz)-151.5 kg (334 lb)] 151.1 kg (333 lb 1.8 oz) (05/29 1350) Constitutional: morbidly obese  HENT:  anicteric Mouth/Throat: Oropharynx is clear and moist. No oropharyngeal exudate.  Cardiovascular: Normal rate, regular rhythm and normal heart sounds. Exam reveals no gallop and no friction rub.  No murmur heard.  Pulmonary/Chest: Effort normal and breath sounds normal. No respiratory distress. He has no wheezes.  Abdominal: Soft. Bowel sounds are normal. He exhibits no distension. There is no tenderness.  Lymphadenopathy: He has no cervical adenopathy.  Neurological: He is alert and oriented to person, place, and time.  Skin: R leg wrapped post op. Psychiatric: He has a normal mood and affect. His behavior is normal.   Lab Results  Recent Labs  03/05/17 1400  WBC 8.3  HGB 9.0*  HCT 27.0*  NA 139  K 3.7  CL 102  CO2 28  BUN 25*  CREATININE 4.89*    Microbiology: Results for orders placed or performed during the hospital encounter of 02/27/17  Blood culture (routine x 2)     Status: None   Collection Time: 02/27/17  7:09 PM  Result Value Ref Range Status   Specimen Description BLOOD RIGHT HAND  Final   Special Requests Blood Culture adequate volume  Final   Culture NO GROWTH 5 DAYS  Final   Report Status 03/04/2017 FINAL  Final  Blood culture (routine x 2)     Status: None   Collection  Time: 02/27/17  7:09 PM  Result Value Ref Range Status   Specimen Description BLOOD RIGHT [facility]  Final   Special Requests Blood Culture adequate volume  Final   Culture NO GROWTH 5 DAYS  Final   Report Status 03/04/2017 FINAL  Final  Aerobic/Anaerobic Culture (surgical/deep wound)     Status: None (Preliminary result)   Collection Time: 02/28/17  3:30 PM  Result Value Ref Range Status   Specimen Description FOOT  Final   Special Requests Immunocompromised  Final   Gram Stain   Final    MODERATE WBC PRESENT,BOTH PMN AND MONONUCLEAR ABUNDANT GRAM NEGATIVE RODS ABUNDANT GRAM POSITIVE COCCI FEW GRAM POSITIVE RODS    Culture   Final    ABUNDANT STREPTOCOCCUS GROUP G ABUNDANT ENTEROCOCCUS  FAECALIS HOLDING FOR POSSIBLE ANAEROBE Performed at [facility], [place] [address], [place], [place] [address]    Report Status PENDING  Incomplete   Organism ID, Bacteria ENTEROCOCCUS FAECALIS  Final      Susceptibility   Enterococcus faecalis - MIC*    AMPICILLIN <=2 SENSITIVE Sensitive     VANCOMYCIN 1 SENSITIVE Sensitive     GENTAMICIN SYNERGY SENSITIVE Sensitive     * ABUNDANT ENTEROCOCCUS FAECALIS  Surgical PCR screen     Status: Abnormal   Collection Time: 02/28/17  5:00 PM  Result Value Ref Range Status   MRSA, PCR NEGATIVE NEGATIVE Final   Staphylococcus aureus POSITIVE (A) NEGATIVE Final    Comment:        The Xpert SA Assay (FDA approved for NASAL specimens in patients over 70 years of age), is one component of a comprehensive surveillance program.  Test performance has been validated by [facility] for patients greater than or equal to 48 year old. It is not intended to diagnose infection nor to guide or monitor treatment.   Culture, blood (routine x 2)     Status: None (Preliminary result)   Collection Time: 03/02/17  8:45 AM  Result Value Ref Range Status   Specimen Description BLOOD RIGHT ASSIST CONTROL  Final   Special Requests   Final    BOTTLES DRAWN AEROBIC AND ANAEROBIC Blood Culture results may not be optimal due to an excessive volume of blood received in culture bottles   Culture NO GROWTH 3 DAYS  Final   Report Status PENDING  Incomplete  Culture, blood (routine x 2)     Status: None (Preliminary result)   Collection Time: 03/02/17  9:11 AM  Result Value Ref Range Status   Specimen Description BLOOD RIGHT ASSIST CONTROL  Final   Special Requests   Final    BOTTLES DRAWN AEROBIC AND ANAEROBIC Blood Culture adequate volume   Culture NO GROWTH 3 DAYS  Final   Report Status PENDING  Incomplete    Studies/Results: No results found.  Assessment/Plan: Steven [name] is a 56 y.o. male with DM, new onset ESRD now with R DM Foot infection s/p  surgery 5/25. Cultures are mixed with enterococcus and grp G strep. He has started HD and has wound vac in place. Plan is for wound vac change in several days.   Recommendations Change to IV unasyn for now as needs anaerobic coverage. If clinically improving can possibly dc on oral augmentin  Thank you very much for the consult. Will follow with you.  [place], Takiah Maiden P   03/05/2017, 3:34 PM

## 2017-03-05 NOTE — Progress Notes (Signed)
Post hd vitals 

## 2017-03-05 NOTE — Progress Notes (Signed)
Inpatient Diabetes Program Recommendations  AACE/ADA: New Consensus Statement on Inpatient Glycemic Control (2015)  Target Ranges:  Prepandial:   less than 140 mg/dL      Peak postprandial:   less than 180 mg/dL (1-2 hours)      Critically ill patients:  140 - 180 mg/dL   Lab Results  Component Value Date   GLUCAP 126 (H) 03/05/2017   HGBA1C 8.2 (H) 02/28/2017    Review of Glycemic Control  Results for NAHEEM, MOSCO (MRN [id number]) as of 03/05/2017 10:07  Ref. Range 03/04/2017 14:18 03/04/2017 17:07 03/04/2017 21:21 03/05/2017 07:20 03/05/2017 09:45  Glucose-Capillary Latest Ref Range: 65 - 99 mg/dL 119 (H) 207 (H) 223 (H) 142 (H) 126 (H)   Diabetes history:A1C 8.2%  Outpatient Diabetes medications: Novolog mix 70/30 sliding scale 40-150 units bid- self adjusting confirmed with patient  Note from Dr. Netty Starring dates 06/05/17 state patient should be taking 80 units bid  Current orders for Inpatient glycemic control: Lantus 30 units qday , Novolog 0-15 units tid  Inpatient Diabetes Program Recommendations: Agree with current medications for blood sugar management.  Gentry Fitz, RN, BA, MHA, CDE Diabetes Coordinator Inpatient Diabetes Program  [phone] (Team Pager) [phone] ([place]) 03/05/2017 10:09 AM

## 2017-03-05 NOTE — H&P (Signed)
[facility] History & Physical Update  The patient was interviewed and re-examined.  The patient's previous History and Physical has been reviewed and is unchanged.  There is no change in the plan of care.  He has done well with the initiation of dialysis and is now ready for tunneled catheter placement.   We plan to proceed with the scheduled procedure.  Hortencia Pilar, MD  03/05/2017, 9:47 AM

## 2017-03-05 NOTE — Progress Notes (Signed)
[employer] [facility] [place] NAME: Steven [name]    MR#:  [id number]  DATE OF BIRTH:  1961-04-03  SUBJECTIVE:   Patient came in with foul smelling ulcer and drainage with the right foot. He is status post right foot incision and drainage with debridement of the abscess. REVIEW OF SYSTEMS:   Review of Systems  Constitutional: Negative for chills, fever and weight loss.  HENT: Negative for ear discharge, ear pain and nosebleeds.   Eyes: Negative for blurred vision, pain and discharge.  Respiratory: Negative for sputum production, shortness of breath, wheezing and stridor.   Cardiovascular: Positive for leg swelling. Negative for chest pain, palpitations, orthopnea and PND.  Gastrointestinal: Negative for abdominal pain, diarrhea, nausea and vomiting.  Genitourinary: Negative for frequency and urgency.  Musculoskeletal: Negative for back pain and joint pain.  Neurological: Positive for weakness. Negative for sensory change, speech change and focal weakness.  Psychiatric/Behavioral: Negative for depression and hallucinations. The patient is not nervous/anxious.    Tolerating Diet:yes Tolerating PT: pending  DRUG ALLERGIES:   Allergies  Allergen Reactions  . Codeine Nausea And Vomiting    VITALS:  Blood pressure (!) 164/82, pulse 93, temperature 98.1 F (36.7 C), temperature source Oral, resp. rate 16, height 5\' 10"  (1.778 m), weight (!) 151.5 kg (334 lb), SpO2 95 %.  PHYSICAL EXAMINATION:   Physical Exam  GENERAL:  56 y.o.-year-old patient lying in the bed with no acute distress. obese EYES: Pupils equal, round, reactive to light and accommodation. No scleral icterus. Extraocular muscles intact.  HEENT: Head atraumatic, normocephalic. Oropharynx and nasopharynx clear.  NECK:  Supple, no jugular venous distention. No thyroid enlargement, no tenderness.  LUNGS: Normal breath sounds bilaterally, no wheezing, rales, rhonchi. No use of  accessory muscles of respiration.  CARDIOVASCULAR: S1, S2 normal. No murmurs, rubs, or gallops.  ABDOMEN: Soft, nontender, nondistended. Bowel sounds present. No organomegaly or mass.  EXTREMITIES: Surgical dressing with wound VAC NEUROLOGIC: Cranial nerves II through XII are intact. No focal Motor or sensory deficits b/l.   PSYCHIATRIC:  patient is alert and verbal commands but sleepy secondary to recent sedation SKIN: No obvious rash, lesion, or ulcer.   LABORATORY PANEL:  CBC  Recent Labs Lab 03/02/17 0845  WBC 8.3  HGB 8.8*  HCT 26.2*  PLT 315    Chemistries   Recent Labs Lab 02/27/17 1718  03/02/17 0845  NA 134*  < > 139  K 5.3*  < > 4.1  CL 99*  < > 101  CO2 22  < > 29  GLUCOSE 251*  < > 231*  BUN 72*  < > 37*  CREATININE 8.24*  < > 5.73*  CALCIUM 8.6*  < > 8.0*  AST 17  --   --   ALT 17  --   --   ALKPHOS 129*  --   --   BILITOT 0.3  --   --   < > = values in this interval not displayed. Cardiac Enzymes No results for input(s): TROPONINI in the last 168 hours. RADIOLOGY:  No results found. ASSESSMENT AND PLAN:   Steven [name]  is a 56 y.o. male with a known history of Chronic kidney disease, diabetes, high cholesterol, hypertension- had a fall 6 weeks ago and broke his elbow, went to his primary care doctor's office, x-rays showed Comminuted fracture and was advised to go to a tertiary care center and special. [name].  * Right diabetic foot abscess/Cellulitis/ulcer  With foul smelling ulcers and severe edema on right foot, possible osteomyelitis - IV vancomycin and Zosyn---now on IV ampicillin as per [facility]  -Podiatry consult appreciated. Patient is status post I and D of abscess and debridement. Will need wound vac for several days, pending re-evaluation by podiatry in 1-2 days to decide continued need. -Infectious disease consultation noted  * Acute on chronic renal failure, CKD stage V--- now ESRD - Nephrology consulted.  -Patient started on  hemodialysis this hospitalization - hold nephrotoxic medication. -Temporary cath  placed on May 24 by Dr Lucky Cowboy Andris Flurry 03/05/17.   Need arrangements of out pt HD.  * Hypertension Amlodipine and hydralazine  * Diabetes--poorly uncontrolled   Insulin sliding scale coverage and Lantus.  Seen by Diabetes coordinator  Pt had very poorly controlled DM   Now better controlled.  * Anemia   Likely due to chronic kidney disease.  Case discussed with Care Management/Social Worker. Management plans discussed with the patient, family and they are in agreement.  CODE STATUS: FULL  DVT Prophylaxis: Heparin  TOTAL TIME TAKING CARE OF THIS PATIENT: *30* minutes.  >50% time spent on counselling and coordination of care  POSSIBLE D/C IN 3-4DAYS, DEPENDING ON CLINICAL CONDITION. Need arrangements for out pt HD and further decision about wound vac.  Note: This dictation was prepared with Dragon dictation along with smaller phrase technology. Any transcriptional errors that result from this process are unintentional.  Vaughan Basta M.D on 03/05/2017 at 11:36 AM  Between 7am to 6pm - Pager - [phone]  After 6pm go to [email or web address] - password [email or web address] [facility]  Office  [phone]  CC: Primary care physician; [name], MD

## 2017-03-06 ENCOUNTER — Inpatient Hospital Stay: Payer: Medicaid Other

## 2017-03-06 LAB — GLUCOSE, CAPILLARY
GLUCOSE-CAPILLARY: 238 mg/dL — AB (ref 65–99)
GLUCOSE-CAPILLARY: 257 mg/dL — AB (ref 65–99)
Glucose-Capillary: 167 mg/dL — ABNORMAL HIGH (ref 65–99)
Glucose-Capillary: 212 mg/dL — ABNORMAL HIGH (ref 65–99)
Glucose-Capillary: 236 mg/dL — ABNORMAL HIGH (ref 65–99)

## 2017-03-06 LAB — CBC
HCT: 27.3 % — ABNORMAL LOW (ref 40.0–52.0)
HEMOGLOBIN: 9.1 g/dL — AB (ref 13.0–18.0)
MCH: 28.8 pg (ref 26.0–34.0)
MCHC: 33.4 g/dL (ref 32.0–36.0)
MCV: 86.2 fL (ref 80.0–100.0)
PLATELETS: 284 10*3/uL (ref 150–440)
RBC: 3.17 MIL/uL — ABNORMAL LOW (ref 4.40–5.90)
RDW: 13.4 % (ref 11.5–14.5)
WBC: 8.3 10*3/uL (ref 3.8–10.6)

## 2017-03-06 MED ORDER — FUROSEMIDE 80 MG PO TABS
80.0000 mg | ORAL_TABLET | Freq: Every day | ORAL | Status: DC
  Administered 2017-03-07: 80 mg via ORAL
  Filled 2017-03-06: qty 1

## 2017-03-06 MED ORDER — IRBESARTAN 150 MG PO TABS
150.0000 mg | ORAL_TABLET | Freq: Every day | ORAL | Status: DC
  Administered 2017-03-06: 150 mg via ORAL
  Filled 2017-03-06: qty 1

## 2017-03-06 MED ORDER — EPOETIN ALFA 10000 UNIT/ML IJ SOLN: 4000.0000 [IU] | INTRAMUSCULAR | Status: DC

## 2017-03-06 NOTE — Progress Notes (Signed)
[facility] INFECTIOUS DISEASE PROGRESS NOTE Date of Admission:  02/27/2017     ID: Steven [name] is a 56 y.o. male with ESRD, DM, R DM foot infection Principal Problem:   Cellulitis in diabetic foot ([place]) Active Problems:   Acute on chronic renal failure (HCC)   Cellulitis  Subjective: Having some episodes hypotension with HD but otherwise doing relaitvely well.   ROS  Eleven systems are reviewed and negative except per hpi  Medications:  Antibiotics Given (last 72 hours)    Date/Time Action Medication Dose Rate   03/03/17 2157 New Bag/Given   piperacillin-tazobactam (ZOSYN) IVPB 3.375 g 3.375 g 12.5 mL/hr   03/04/17 1448 New Bag/Given   ampicillin (OMNIPEN) 2 g in sodium chloride 0.9 % 50 mL IVPB 2 g 150 mL/hr   03/06/17 0019 New Bag/Given   Ampicillin-Sulbactam (UNASYN) 3 g in sodium chloride 0.9 % 100 mL IVPB 3 g 200 mL/hr   03/06/17 0947 New Bag/Given   Ampicillin-Sulbactam (UNASYN) 3 g in sodium chloride 0.9 % 100 mL IVPB 3 g 200 mL/hr     . aspirin EC  81 mg Oral Daily  . [START ON 03/07/2017] epoetin (EPOGEN/PROCRIT) injection  4,000 Units Intravenous Q T,Th,Sa-HD  . furosemide  80 mg Oral Daily  . gabapentin  300 mg Oral BID  . heparin  5,000 Units Subcutaneous Q8H  . insulin aspart  0-15 Units Subcutaneous TID WC  . insulin glargine  30 Units Subcutaneous Daily  . irbesartan  150 mg Oral QHS  . living well with diabetes book   Does not apply Once  . metoprolol tartrate  25 mg Oral BID    Objective: Vital signs in last 24 hours: Temp:  [98.4 F (36.9 C)-98.6 F (37 C)] 98.6 F (37 C) (05/30 1235) Pulse Rate:  [65-100] 65 (05/30 1606) Resp:  [16-20] 16 (05/30 1235) BP: (84-154)/(49-87) 101/58 (05/30 1606) SpO2:  [95 %-99 %] 96 % (05/30 1606) Constitutional: morbidly obese  HENT: anicteric Mouth/Throat: Oropharynx is clear and moist. No oropharyngeal exudate.  Cardiovascular: Normal rate, regular rhythm and normal heart sounds. Exam reveals no gallop  and no friction rub.  No murmur heard.  Pulmonary/Chest: Effort normal and breath sounds normal. No respiratory distress. He has no wheezes.  Abdominal: Soft. Bowel sounds are normal. He exhibits no distension. There is no tenderness.  Lymphadenopathy: He has no cervical adenopathy.  Neurological: He is alert and oriented to person, place, and time.  Skin: R leg covered with wound vac . Psychiatric: He has a normal mood and affect. His behavior is normal.   Lab Results  Recent Labs  03/05/17 1400 03/06/17 0446  WBC 8.3 8.3  HGB 9.0* 9.1*  HCT 27.0* 27.3*  NA 139  --   K 3.7  --   CL 102  --   CO2 28  --   BUN 25*  --   CREATININE 4.89*  --     Microbiology: Results for orders placed or performed during the hospital encounter of 02/27/17  Blood culture (routine x 2)     Status: None   Collection Time: 02/27/17  7:09 PM  Result Value Ref Range Status   Specimen Description BLOOD RIGHT HAND  Final   Special Requests Blood Culture adequate volume  Final   Culture NO GROWTH 5 DAYS  Final   Report Status 03/04/2017 FINAL  Final  Blood culture (routine x 2)     Status: None   Collection Time: 02/27/17  7:09  PM  Result Value Ref Range Status   Specimen Description BLOOD RIGHT [facility]  Final   Special Requests Blood Culture adequate volume  Final   Culture NO GROWTH 5 DAYS  Final   Report Status 03/04/2017 FINAL  Final  Aerobic/Anaerobic Culture (surgical/deep wound)     Status: None (Preliminary result)   Collection Time: 02/28/17  3:30 PM  Result Value Ref Range Status   Specimen Description FOOT  Final   Special Requests Immunocompromised  Final   Gram Stain   Final    MODERATE WBC PRESENT,BOTH PMN AND MONONUCLEAR ABUNDANT GRAM NEGATIVE RODS ABUNDANT GRAM POSITIVE COCCI FEW GRAM POSITIVE RODS    Culture   Final    ABUNDANT STREPTOCOCCUS GROUP G ABUNDANT ENTEROCOCCUS FAECALIS HOLDING FOR POSSIBLE ANAEROBE Performed at [facility], [place] [address] [place],  [place] [address]    Report Status PENDING  Incomplete   Organism ID, Bacteria ENTEROCOCCUS FAECALIS  Final      Susceptibility   Enterococcus faecalis - MIC*    AMPICILLIN <=2 SENSITIVE Sensitive     VANCOMYCIN 1 SENSITIVE Sensitive     GENTAMICIN SYNERGY SENSITIVE Sensitive     * ABUNDANT ENTEROCOCCUS FAECALIS  Surgical PCR screen     Status: Abnormal   Collection Time: 02/28/17  5:00 PM  Result Value Ref Range Status   MRSA, PCR NEGATIVE NEGATIVE Final   Staphylococcus aureus POSITIVE (A) NEGATIVE Final    Comment:        The Xpert SA Assay (FDA approved for NASAL specimens in patients over 21 years of age), is one component of a comprehensive surveillance program.  Test performance has been validated by [facility] for patients greater than or equal to 42 year old. It is not intended to diagnose infection nor to guide or monitor treatment.   Culture, blood (routine x 2)     Status: None (Preliminary result)   Collection Time: 03/02/17  8:45 AM  Result Value Ref Range Status   Specimen Description BLOOD RIGHT ASSIST CONTROL  Final   Special Requests   Final    BOTTLES DRAWN AEROBIC AND ANAEROBIC Blood Culture results may not be optimal due to an excessive volume of blood received in culture bottles   Culture NO GROWTH 4 DAYS  Final   Report Status PENDING  Incomplete  Culture, blood (routine x 2)     Status: None (Preliminary result)   Collection Time: 03/02/17  9:11 AM  Result Value Ref Range Status   Specimen Description BLOOD RIGHT ASSIST CONTROL  Final   Special Requests   Final    BOTTLES DRAWN AEROBIC AND ANAEROBIC Blood Culture adequate volume   Culture NO GROWTH 4 DAYS  Final   Report Status PENDING  Incomplete    Studies/Results: No results found.  Assessment/Plan: Steven [name] is a 56 y.o. male with DM, new onset ESRD now with R DM Foot infection s/p surgery 5/25. Cultures are mixed with enterococcus and grp G strep. He has started HD and has wound vac in  place. Plan is for wound vac change in several days.   Recommendations Change to IV unasyn for now as needs anaerobic coverage. If clinically improving can possibly dc on oral augmentin  Thank you very much for the consult. Will follow with you.  Payal Stanforth P   03/06/2017, 7:05 PM

## 2017-03-06 NOTE — Progress Notes (Signed)
[facility]  ROUNDING NOTE   Subjective:   Patient is doing well  no acute events reported Able to eat without nausea or vomiting Wound vac change today  Objective:  Vital signs in last 24 hours:  Temp:  [98.1 F (36.7 C)-98.9 F (37.2 C)] 98.6 F (37 C) (05/30 1235) Pulse Rate:  [73-100] 73 (05/30 1235) Resp:  [13-23] 16 (05/30 1235) BP: (74-164)/(47-87) 154/87 (05/30 1353) SpO2:  [91 %-99 %] 99 % (05/30 1235) Weight:  [149.1 kg (328 lb 11.3 oz)] 149.1 kg (328 lb 11.3 oz) (05/29 1730)  Weight change: -0.399 kg (-14.1 oz) Filed Weights   03/05/17 0925 03/05/17 1350 03/05/17 1730  Weight: (!) 151.5 kg (334 lb) (!) 151.1 kg (333 lb 1.8 oz) (!) 149.1 kg (328 lb 11.3 oz)    Intake/Output: I/O last 3 completed shifts: In: 42 [P.O.:120; IV Piggyback:85] Out: 4239 [Urine:1150; Drains:100; Other:1800]   Intake/Output this shift:  Total I/O In: 600 [P.O.:600] Out: 0   Physical Exam: General: NAD, sitting up in chair  Head: Normocephalic, atraumatic. Moist oral mucosal membranes  Eyes: Anicteric,    Neck: Supple, trachea midline  Lungs:  Clear to auscultation  Heart: Regular rate and rhythm  Abdomen:  Soft, nontender, obese  Extremities: + peripheral edema, right foot wound vac,    Neurologic: Nonfocal, moving all four extremities     Access: IJ Permcath    Basic Metabolic Panel:  Recent Labs Lab 02/27/17 1718 02/28/17 0443 02/28/17 1030 03/01/17 0446 03/01/17 1254 03/02/17 0845 03/05/17 1400  NA 134* 135  --   --  138 139 139  K 5.3* 4.8  --  4.6 4.0 4.1 3.7  CL 99* 101  --   --  102 101 102  CO2 22 24  --   --  28 29 28   GLUCOSE 251* 291*  --   --  190* 231* 213*  BUN 72* 71*  --   --  44* 37* 25*  CREATININE 8.24* 8.03*  --   --  6.03* 5.73* 4.89*  CALCIUM 8.6* 8.1*  --   --  7.8* 8.0* 8.2*  PHOS  --   --  6.4*  --  5.4* 5.1* 4.4    Liver Function Tests:  Recent Labs Lab 02/27/17 1718 03/01/17 1254 03/02/17 0845 03/05/17 1400   AST 17  --   --   --   ALT 17  --   --   --   ALKPHOS 129*  --   --   --   BILITOT 0.3  --   --   --   PROT 7.6  --   --   --   ALBUMIN 2.6* 2.1* 2.2* 2.2*   No results for input(s): LIPASE, AMYLASE in the last 168 hours. No results for input(s): AMMONIA in the last 168 hours.  CBC:  Recent Labs Lab 02/27/17 1718 02/28/17 0443 03/01/17 1254 03/02/17 0845 03/05/17 1400 03/06/17 0446  WBC 11.1* 6.7 6.0 8.3 8.3 8.3  NEUTROABS 9.2*  --   --   --   --   --   HGB 9.4* 9.4* 8.5* 8.8* 9.0* 9.1*  HCT 28.1* 27.7* 25.2* 26.2* 27.0* 27.3*  MCV 86.1 86.5 86.5 85.3 85.7 86.2  PLT 337 332 302 315 295 284    Cardiac Enzymes: No results for input(s): CKTOTAL, CKMB, CKMBINDEX, TROPONINI in the last 168 hours.  BNP: Invalid input(s): POCBNP  CBG:  Recent Labs Lab 03/05/17 1157 03/05/17 1642 03/05/17 2054  03/06/17 0731 03/06/17 1156  GLUCAP 193* 145* 225* 167* 238*    Microbiology: Results for orders placed or performed during the hospital encounter of 02/27/17  Blood culture (routine x 2)     Status: None   Collection Time: 02/27/17  7:09 PM  Result Value Ref Range Status   Specimen Description BLOOD RIGHT HAND  Final   Special Requests Blood Culture adequate volume  Final   Culture NO GROWTH 5 DAYS  Final   Report Status 03/04/2017 FINAL  Final  Blood culture (routine x 2)     Status: None   Collection Time: 02/27/17  7:09 PM  Result Value Ref Range Status   Specimen Description BLOOD RIGHT [facility]  Final   Special Requests Blood Culture adequate volume  Final   Culture NO GROWTH 5 DAYS  Final   Report Status 03/04/2017 FINAL  Final  Aerobic/Anaerobic Culture (surgical/deep wound)     Status: None (Preliminary result)   Collection Time: 02/28/17  3:30 PM  Result Value Ref Range Status   Specimen Description FOOT  Final   Special Requests Immunocompromised  Final   Gram Stain   Final    MODERATE WBC PRESENT,BOTH PMN AND MONONUCLEAR ABUNDANT GRAM NEGATIVE RODS ABUNDANT  GRAM POSITIVE COCCI FEW GRAM POSITIVE RODS    Culture   Final    ABUNDANT STREPTOCOCCUS GROUP G ABUNDANT ENTEROCOCCUS FAECALIS HOLDING FOR POSSIBLE ANAEROBE Performed at [facility], [address], [place], [place] [address]    Report Status PENDING  Incomplete   Organism ID, Bacteria ENTEROCOCCUS FAECALIS  Final      Susceptibility   Enterococcus faecalis - MIC*    AMPICILLIN <=2 SENSITIVE Sensitive     VANCOMYCIN 1 SENSITIVE Sensitive     GENTAMICIN SYNERGY SENSITIVE Sensitive     * ABUNDANT ENTEROCOCCUS FAECALIS  Surgical PCR screen     Status: Abnormal   Collection Time: 02/28/17  5:00 PM  Result Value Ref Range Status   MRSA, PCR NEGATIVE NEGATIVE Final   Staphylococcus aureus POSITIVE (A) NEGATIVE Final    Comment:        The Xpert SA Assay (FDA approved for NASAL specimens in patients over 103 years of age), is one component of a comprehensive surveillance program.  Test performance has been validated by [facility] for patients greater than or equal to 83 year old. It is not intended to diagnose infection nor to guide or monitor treatment.   Culture, blood (routine x 2)     Status: None (Preliminary result)   Collection Time: 03/02/17  8:45 AM  Result Value Ref Range Status   Specimen Description BLOOD RIGHT ASSIST CONTROL  Final   Special Requests   Final    BOTTLES DRAWN AEROBIC AND ANAEROBIC Blood Culture results may not be optimal due to an excessive volume of blood received in culture bottles   Culture NO GROWTH 4 DAYS  Final   Report Status PENDING  Incomplete  Culture, blood (routine x 2)     Status: None (Preliminary result)   Collection Time: 03/02/17  9:11 AM  Result Value Ref Range Status   Specimen Description BLOOD RIGHT ASSIST CONTROL  Final   Special Requests   Final    BOTTLES DRAWN AEROBIC AND ANAEROBIC Blood Culture adequate volume   Culture NO GROWTH 4 DAYS  Final   Report Status PENDING  Incomplete    Coagulation Studies: No  results for input(s): LABPROT, INR in the last 72 hours.  Urinalysis: No results for input(s): COLORURINE, LABSPEC, PHURINE, GLUCOSEU, HGBUR, BILIRUBINUR, KETONESUR, PROTEINUR, UROBILINOGEN, NITRITE, LEUKOCYTESUR in the last 72 hours.  Invalid input(s): APPERANCEUR    Imaging: No results found.   Medications:   . ampicillin-sulbactam (UNASYN) IV Stopped (03/06/17 1017)   . amLODipine  10 mg Oral Daily  . aspirin EC  81 mg Oral Daily  . epoetin (EPOGEN/PROCRIT) injection  10,000 Units Intravenous Q M,W,F-HD  . gabapentin  300 mg Oral BID  . heparin  5,000 Units Subcutaneous Q8H  . hydrALAZINE  50 mg Oral Q8H  . insulin aspart  0-15 Units Subcutaneous TID WC  . insulin glargine  30 Units Subcutaneous Daily  . living well with diabetes book   Does not apply Once  . metoprolol tartrate  25 mg Oral BID   acetaminophen, docusate sodium, oxyCODONE-acetaminophen  Assessment/ Plan:  [name] is a 56 y.o. white male  with hypertension, diabetes mellitus type 2 insulin dependent, hyperlipidemia, GERD, and morbid obesity  1. 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 No biopsy.  Hyperkalemia on admission Initiated on hemodialysis on this admission. First dialysis was 5/24. Tolerated treatment well.  - PPD negative 5/26.  - new perncath placed 5/29 - d/c planning in progress- Clancy Davita - vein mapping - will need Abx set up in the outpatient dialysis unit  2. Hypertension: blood pressure at goal.   - d/c hydralazine - start ARB  3. Anemia of chronic kidney disease: hemoglobin 9.1 - EPO with Hemodialysis treatment   4. Secondary Hyperparathyroidism: calcium at goal. Not currently on vitamin D. PTH 121.   - low phos diet.   5. Diabetes mellitus type II with chronic kidney disease: hemoglobin C1E 7.5% complicated with with diabetic foot ulcer right foot status post I&D 5/25 Dr. Elvina Mattes.  Wound vac - Appreciate ID and podiatry input.  - Continue glucose control.    6. Left olecrenon fracture: working with OT - Appreciate ortho input. Patient will need to see an elbow specialist.     LOS: 7 Theta Leaf 5/30/20183:04 PM

## 2017-03-06 NOTE — Progress Notes (Signed)
Wound vac dressing changed per MD order.

## 2017-03-06 NOTE — Care Management (Signed)
Application for [facility] program and Vac order form has been faxed to [facility] for [employer] wound vac. Corene Cornea from [place] has been notified of the need for charity services for nursing and SW. ID following

## 2017-03-06 NOTE — Progress Notes (Signed)
Patient Demographics  Steven [name], is a 56 y.o. male   MRN: [id number]   DOB - 10/04/1961  Admit Date - 02/27/2017    Outpatient Primary MD for the patient is [name], MD  Consult requested in the Hospital by Vaughan Basta, *, On 03/06/2017       With History of -  Past Medical History:  Diagnosis Date  . Cellulitis and abscess of right lower extremity 03/01/2017  . Chronic kidney disease   . Diabetes mellitus without complication ([place])   . Edema extremities 03/01/2017   bilateral swelling  . High cholesterol   . High triglycerides   . Hypertension   . Sleep apnea    can't use cpap d/t feelings of suffocation      Past Surgical History:  Procedure Laterality Date  . APPENDECTOMY  2010  . DIALYSIS/PERMA CATHETER INSERTION N/A 03/05/2017   Procedure: Dialysis/Perma Catheter Insertion;  Surgeon: Katha Cabal, MD;  Location: [facility];  Service: Cardiovascular;  Laterality: N/A;  . IRRIGATION AND DEBRIDEMENT FOOT Right 03/01/2017   Procedure: IRRIGATION AND DEBRIDEMENT FOOT;  Surgeon: Albertine Patricia, DPM;  Location: [facility];  Service: Podiatry;  Laterality: Right;  application of wound vac    in for   Chief Complaint  Patient presents with  . Wound Check  . Leg Swelling  . Elbow Injury     HPI  Steven [name]  is a 56 y.o. male, Proximally 5 days status post debridement of infected ulcer including skin skin structures simultaneous fat and deeper structures overlying tendons. His been utilizing a wound VAC since that time as well IV antibiotics. He states he is doing well.    Social History Social History  Substance Use Topics  . Smoking status: Never Smoker  . Smokeless tobacco: Never Used  . Alcohol use Yes     Comment: up until 01/2017 was heavy  drinker...4 40 oz qd     Family History Family History  Problem Relation Age of Onset  . CAD Father      Prior to Admission medications   Medication Sig Start Date End Date Taking? Authorizing Provider  amLODipine (NORVASC) 10 MG tablet TAKE ONE TABLET BY MOUTH ONCE DAILY --  **NEEDS  VISIT  WITH  PRIMARY  CARE  PROVIDER  BEFORE  REFILLS  GIVEN** 12/01/14  Yes Alveda Reasons, MD  aspirin EC 81 MG tablet TAKE ONE  BY MOUTH EVERY DAY 09/15/12  Yes Alveda Reasons, MD  furosemide (LASIX) 20 MG tablet TAKE TWO TABLETS BY MOUTH TWICE DAILY AS NEEDED FOR EDEMA Patient taking differently: No sig reported 12/03/14  Yes Alveda Reasons, MD  glucose blood (ACCU-CHEK AVIVA) test strip Use as instructed.use to check blood sugar up to 3 times a day  Disp: 1 box 08/20/14  Yes Alveda Reasons, MD  HYDRALAZINE HCL PO Take 50 mg by mouth 3 (three) times daily.    Yes [provider]  HYDROcodone-acetaminophen (NORCO/VICODIN) 5-325 MG  tablet One tablet every four hours as needed for acute pain.  Limit of five days per [place]. 02/15/17  Yes Carole Civil, MD  ibuprofen (ADVIL,MOTRIN) 800 MG tablet Take 1 tablet (800 mg total) by mouth every 8 (eight) hours as needed. 01/21/17  Yes Carole Civil, MD  insulin aspart protamine- aspart (NOVOLOG MIX 70/30) (70-30) 100 UNIT/ML injection Inject 40-150 Units into the skin 2 (two) times daily with a meal.   Yes [provider]  lisinopril (PRINIVIL,ZESTRIL) 40 MG tablet Take 40 mg by mouth daily.    Yes [provider]  insulin lispro (HUMALOG) 100 UNIT/ML injection Inject 0.3 mLs (30 Units total) into the skin 3 (three) times daily before meals. Patient not taking: Reported on 02/27/2017 12/01/14   Alveda Reasons, MD  Insulin Pen Needle 31G X 8 MM MISC 1 Container by Does not apply route QID. qid--any brand 08/20/14   Alveda Reasons, MD  Insulin Syringes, Disposable, U-100 1 ML MISC Use as directed with insulin  08/20/14   Alveda Reasons, MD  Lancets (ACCU-CHEK MULTICLIX) lancets Use as instructed.  check blood sugar up to 3 times a day Disp:  1 box 08/20/14   Alveda Reasons, MD  lisinopril-hydrochlorothiazide (PRINZIDE,ZESTORETIC) 20-25 MG per tablet TAKE ONE TABLET BY MOUTH ONCE DAILY Patient not taking: Reported on 02/27/2017 12/01/14   Alveda Reasons, MD  oxyCODONE-acetaminophen (PERCOCET/ROXICET) 5-325 MG tablet Take 2 tablets by mouth every 4 (four) hours as needed for severe pain. 02/05/17   Fransico Meadow, PA-C    Anti-infectives    Start     Dose/Rate Route Frequency Ordered Stop   03/05/17 2200  Ampicillin-Sulbactam (UNASYN) 3 g in sodium chloride 0.9 % 100 mL IVPB     3 g 200 mL/hr over 30 Minutes Intravenous Every 12 hours 03/05/17 2108     03/04/17 1500  ampicillin (OMNIPEN) 2 g in sodium chloride 0.9 % 50 mL IVPB     2 g 150 mL/hr over 20 Minutes Intravenous Every 24 hours 03/04/17 1354     03/01/17 0000  vancomycin (VANCOCIN) IVPB 1000 mg/200 mL premix  Status:  Discontinued     1,000 mg 200 mL/hr over 60 Minutes Intravenous As needed 02/28/17 1035 02/28/17 1232   02/28/17 1232  vancomycin (VANCOCIN) IVPB 1000 mg/200 mL premix  Status:  Discontinued     1,000 mg 200 mL/hr over 60 Minutes Intravenous Every Dialysis 02/28/17 1232 03/04/17 1048   02/28/17 1000  piperacillin-tazobactam (ZOSYN) IVPB 3.375 g  Status:  Discontinued     3.375 g 12.5 mL/hr over 240 Minutes Intravenous Every 12 hours 02/27/17 1857 03/04/17 1356   02/27/17 1900  piperacillin-tazobactam (ZOSYN) IVPB 3.375 g     3.375 g 100 mL/hr over 30 Minutes Intravenous  Once 02/27/17 1848 02/27/17 2309   02/27/17 1845  vancomycin (VANCOCIN) 2,000 mg in sodium chloride 0.9 % 500 mL IVPB     2,000 mg 250 mL/hr over 120 Minutes Intravenous  Once 02/27/17 1848 02/27/17 2235      Scheduled Meds: . amLODipine  10 mg Oral Daily  . aspirin EC  81 mg Oral Daily  . Chlorhexidine Gluconate Cloth  6 each Topical Daily   . epoetin (EPOGEN/PROCRIT) injection  10,000 Units Intravenous Q M,W,F-HD  . gabapentin  300 mg Oral BID  . heparin  5,000 Units Subcutaneous Q8H  . hydrALAZINE  50 mg Oral Q8H  . insulin aspart  0-15 Units Subcutaneous TID  WC  . insulin glargine  30 Units Subcutaneous Daily  . living well with diabetes book   Does not apply Once  . metoprolol tartrate  25 mg Oral BID   Continuous Infusions: . ampicillin (OMNIPEN) IV Stopped (03/04/17 1720)  . ampicillin-sulbactam (UNASYN) IV Stopped (03/06/17 0049)   PRN Meds:.acetaminophen, docusate sodium, oxyCODONE-acetaminophen  Allergies  Allergen Reactions  . Codeine Nausea And Vomiting    Physical Exam  Vitals  Blood pressure (!) 141/63, pulse 77, temperature 98.4 F (36.9 C), temperature source Oral, resp. rate 20, height 5\' 10"  (1.778 m), weight (!) 149.1 kg (328 lb 11.3 oz), SpO2 98 %.  Lower Extremity exam:Wound VAC was removed Devi exam of the foot shows that it is granulating and filling and that has closed in somewhat since surgery. Overall looks to be stable with cellulitis is vastly improved. White count still remains within normal limits. Data Review  CBC  Recent Labs Lab 02/27/17 1718 02/28/17 0443 03/01/17 1254 03/02/17 0845 03/05/17 1400 03/06/17 0446  WBC 11.1* 6.7 6.0 8.3 8.3 8.3  HGB 9.4* 9.4* 8.5* 8.8* 9.0* 9.1*  HCT 28.1* 27.7* 25.2* 26.2* 27.0* 27.3*  PLT 337 332 302 315 295 284  MCV 86.1 86.5 86.5 85.3 85.7 86.2  MCH 28.9 29.3 29.3 28.8 28.4 28.8  MCHC 33.5 33.9 33.9 33.7 33.1 33.4  RDW 13.1 13.4 13.3 13.4 13.3 13.4  LYMPHSABS 0.7*  --   --   --   --   --   MONOABS 0.9  --   --   --   --   --   EOSABS 0.2  --   --   --   --   --   BASOSABS 0.1  --   --   --   --   --    ------------------------------------------------------------------------------------------------------------------  Chemistries   Recent Labs Lab 02/27/17 1718 02/28/17 0443 03/01/17 0446 03/01/17 1254 03/02/17 0845  03/05/17 1400  NA 134* 135  --  138 139 139  K 5.3* 4.8 4.6 4.0 4.1 3.7  CL 99* 101  --  102 101 102  CO2 22 24  --  28 29 28   GLUCOSE 251* 291*  --  190* 231* 213*  BUN 72* 71*  --  44* 37* 25*  CREATININE 8.24* 8.03*  --  6.03* 5.73* 4.89*  CALCIUM 8.6* 8.1*  --  7.8* 8.0* 8.2*  AST 17  --   --   --   --   --   ALT 17  --   --   --   --   --   ALKPHOS 129*  --   --   --   --   --   BILITOT 0.3  --   --   --   --   --    ----------------------------------------------------------------------------------------  Assessment & Plan: I applied a dry dressing daily with nursing staff to put a new wound VAC: Some point this morning. Overall he is doing well but would be helpful if he did have the wound VAC for another week or so. I will speak with care management and see how is progressing as far as any type of coverage.  Principal Problem:   Cellulitis in diabetic foot ([place]) Active Problems:   Acute on chronic renal failure ([facility])   Cellulitis     Family Communication: Plan discussed with patient  Perry Mount M.D on 03/06/2017 at 7:52 AM  Thank you for the consult, we will follow the  patient with you in the Hospital.

## 2017-03-06 NOTE — Progress Notes (Signed)
[facility] [place] NAME: Steven [name]    MR#:  [id number]  DATE OF BIRTH:  26-Aug-1961  SUBJECTIVE:   Patient came in with foul smelling ulcer and drainage with the right foot. He is status post right foot incision and drainage with debridement of the abscess. REVIEW OF SYSTEMS:   Review of Systems  Constitutional: Negative for chills, fever and weight loss.  HENT: Negative for ear discharge, ear pain and nosebleeds.   Eyes: Negative for blurred vision, pain and discharge.  Respiratory: Negative for sputum production, shortness of breath, wheezing and stridor.   Cardiovascular: Positive for leg swelling. Negative for chest pain, palpitations, orthopnea and PND.  Gastrointestinal: Negative for abdominal pain, diarrhea, nausea and vomiting.  Genitourinary: Negative for frequency and urgency.  Musculoskeletal: Negative for back pain and joint pain.  Neurological: Positive for weakness. Negative for sensory change, speech change and focal weakness.  Psychiatric/Behavioral: Negative for depression and hallucinations. The patient is not nervous/anxious.    Tolerating Diet:yes Tolerating PT: pending  DRUG ALLERGIES:   Allergies  Allergen Reactions  . Codeine Nausea And Vomiting    VITALS:  Blood pressure (!) 146/80, pulse 73, temperature 98.6 F (37 C), temperature source Oral, resp. rate 16, height 5\' 10"  (1.778 m), weight (!) 149.1 kg (328 lb 11.3 oz), SpO2 99 %.  PHYSICAL EXAMINATION:   Physical Exam  GENERAL:  56 y.o.-year-old patient lying in the bed with no acute distress. obese EYES: Pupils equal, round, reactive to light and accommodation. No scleral icterus. Extraocular muscles intact.  HEENT: Head atraumatic, normocephalic. Oropharynx and nasopharynx clear.  NECK:  Supple, no jugular venous distention. No thyroid enlargement, no tenderness.  LUNGS: Normal breath sounds bilaterally, no wheezing, rales, rhonchi. No  use of accessory muscles of respiration.    permacath in place. CARDIOVASCULAR: S1, S2 normal. No murmurs, rubs, or gallops.  ABDOMEN: Soft, nontender, nondistended. Bowel sounds present. No organomegaly or mass.  EXTREMITIES: Surgical dressing with wound VAC NEUROLOGIC: Cranial nerves II through XII are intact. No focal Motor or sensory deficits b/l.   PSYCHIATRIC:  patient is alert and verbal commands but sleepy secondary to recent sedation SKIN: No obvious rash, lesion, or ulcer.   LABORATORY PANEL:  CBC  Recent Labs Lab 03/06/17 0446  WBC 8.3  HGB 9.1*  HCT 27.3*  PLT 284    Chemistries   Recent Labs Lab 02/27/17 1718  03/05/17 1400  NA 134*  < > 139  K 5.3*  < > 3.7  CL 99*  < > 102  CO2 22  < > 28  GLUCOSE 251*  < > 213*  BUN 72*  < > 25*  CREATININE 8.24*  < > 4.89*  CALCIUM 8.6*  < > 8.2*  AST 17  --   --   ALT 17  --   --   ALKPHOS 129*  --   --   BILITOT 0.3  --   --   < > = values in this interval not displayed. Cardiac Enzymes No results for input(s): TROPONINI in the last 168 hours. RADIOLOGY:  No results found. ASSESSMENT AND PLAN:   Steven [name]  is a 56 y.o. male with a known history of Chronic kidney disease, diabetes, high cholesterol, hypertension- had a fall 6 weeks ago and broke his elbow, went to his primary care doctor's office, x-rays showed Comminuted fracture and was advised to go to a tertiary care center and special. Steven [name]  [name].  * Right diabetic foot abscess/Cellulitis/ulcer  With foul smelling ulcers and severe edema on right foot, possible osteomyelitis - IV vancomycin and Zosyn---now on IV ampicillin as per Steven [name]  -Podiatry consult appreciated. Patient is status post I and D of abscess and debridement. Will need wound vac for several days, as per podiatry- one more week. -Infectious disease consultation noted  * Acute on chronic renal failure, CKD stage V--- now ESRD - Nephrology consulted.  -Patient started on hemodialysis  this hospitalization - hold nephrotoxic medication. -Temporary cath  placed on May 24 by Steven [name] Steven [name] 03/05/17.   Need arrangements of out pt HD.  * Hypertension Amlodipine and hydralazine  * Diabetes--poorly uncontrolled   Insulin sliding scale coverage and Lantus.  Seen by Diabetes coordinator  Pt had very poorly controlled DM   Now better controlled.  * Anemia   Likely due to chronic kidney disease.  * elbow fracture   Appreciated orthopedic consult- suggested to see hand and elbow surgeon at [facility].   PT and OT.  Case discussed with Care Management/Social Worker. Management plans discussed with the patient, family and they are in agreement.  CODE STATUS: FULL  DVT Prophylaxis: Heparin  TOTAL TIME TAKING CARE OF THIS PATIENT: *30* minutes.  >50% time spent on counselling and coordination of care  POSSIBLE D/C IN 3-4DAYS, DEPENDING ON CLINICAL CONDITION. Need arrangements for out pt HD and further decision about wound vac.  Note: This dictation was prepared with Dragon dictation along with smaller phrase technology. Any transcriptional errors that result from this process are unintentional.  Steven [name] M.D on 03/06/2017 at 1:33 PM  Between 7am to 6pm - Pager - [phone]  After 6pm go to [email or web address] - password [email or web address] [facility]  Office  [phone]  CC: Primary care physician; [name], MD

## 2017-03-06 NOTE — Evaluation (Addendum)
Physical Therapy Evaluation Patient Details Name: Steven [name] MRN: [id number] DOB: 1961/01/27 Today's Date: 03/06/2017   History of Present Illness  Pt. is a 56 y.o. male who was admitted to [facility] with Cellulitis, and abscess of right LE, CKD, DM, Edema bilateral LE's High Cholesterol, HTN, Sleep Apnea, and new onset of ESRD.  Approximately 6 weeks ago, Pt. sustained a Left elbow Fracture, Displacement of the Left radial head, subluxation of the elbow. Of note, pt now with permcath placed on 03/05/17 and temp cath removed on 5/30.   Clinical Impression  Pt is a pleasant 56 year old male who was admitted for cellulitis of R foot and kidney dysfunction. Of note, pt with subacute L radial head displacement and sublux of elbow. Has been in semi firm post splint until 3 days ago. Cleared from ortho to perform gentle active/passive ROM. Per podiatry, pt cleared for FWB with post op shoe donned for chair and bathroom privileges. Pt in recliner upon arrival, however performs transfers/ambulation with safe technique, no need for AD. Unable to wear post op shoe secondary to bulkiness of dressing. Educated pt on ROM to improve shoulder,elbow,wrist, and hand. Pt appears motivated to participate. Pt demonstrates deficits with strength/pain/AROM of L elbow. Would benefit from skilled PT to address above deficits and promote optimal return to PLOF. Recommend continued OT and PT for strengthening and prevention of deconditioning during hospitalization.       Follow Up Recommendations Outpatient PT    Equipment Recommendations  None recommended by PT    Recommendations for Other Services       Precautions / Restrictions Precautions Precautions: Fall Restrictions Weight Bearing Restrictions: No      Mobility  Bed Mobility               General bed mobility comments: not performed as pt received sitting in recliner  Transfers Overall transfer level: Independent Equipment used: None              General transfer comment: safe technique performed with no AD. Pt steady  Ambulation/Gait Ambulation/Gait assistance: Supervision Ambulation Distance (Feet): 10 Feet Assistive device: None Gait Pattern/deviations: WFL(Within Functional Limits)     General Gait Details: safe technique performed with limited ambulation secondary to line/leads. Wound vac noted. Pt unable to fit in post-op shoe secondary to large dressing. RN aware  Stairs            Wheelchair Mobility    Modified Rankin (Stroke Patients Only)       Balance Overall balance assessment: Needs assistance;History of Falls Sitting-balance support: Feet supported Sitting balance-Leahy Scale: Normal     Standing balance support: No upper extremity supported Standing balance-Leahy Scale: Good                               Pertinent Vitals/Pain Pain Assessment: Faces Faces Pain Scale: Hurts a little bit Pain Location: end range of elbow movement Pain Descriptors / Indicators: Aching Pain Intervention(s): Limited activity within patient's tolerance    Home Living Family/patient expects to be discharged to:: Private residence Living Arrangements: Alone Available Help at Discharge: Family Type of Home: Mobile home Home Access: Ramped entrance     Home Layout: One level        Prior Function Level of Independence: Independent      ADL's / Homemaking Assistance Needed: Independent with ADLs, and IADLs, driving, worked as an Research scientist (life sciences).  Hand Dominance   Dominant Hand: Right    Extremity/Trunk Assessment   Upper Extremity Assessment Upper Extremity Assessment: Generalized weakness LUE Deficits / Details: Slight edema noted in elbow, decreased in hand. Elbow PROM 20-120 degrees and limited by pain. SHoulder/wrist/hand WNL    Lower Extremity Assessment Lower Extremity Assessment: Overall WFL for tasks assessed       Communication   Communication: No  difficulties  Cognition Arousal/Alertness: Awake/alert Behavior During Therapy: WFL for tasks assessed/performed Overall Cognitive Status: Within Functional Limits for tasks assessed                                        General Comments      Exercises Other Exercises Other Exercises: Elbow PROM/AROM x 10 reps including supination/pronation and flexion/extension. Pt also performed scap squeezes, wrist circles, and grip squeezes. Supervision given for correct technique   Assessment/Plan    PT Assessment Patient needs continued PT services  PT Problem List Decreased strength;Decreased range of motion;Pain       PT Treatment Interventions Gait training;Therapeutic exercise    PT Goals (Current goals can be found in the Care Plan section)  Acute Rehab PT Goals Patient Stated Goal: to return back to work PT Goal Formulation: With patient Time For Goal Achievement: 03/20/17 Potential to Achieve Goals: Good    Frequency Min 2X/week   Barriers to discharge        Co-evaluation               AM-PAC PT "6 Clicks" Daily Activity  Outcome Measure Difficulty turning over in bed (including adjusting bedclothes, sheets and blankets)?: None Difficulty moving from lying on back to sitting on the side of the bed? : None Difficulty sitting down on and standing up from a chair with arms (e.g., wheelchair, bedside commode, etc,.)?: None Help needed moving to and from a bed to chair (including a wheelchair)?: None Help needed walking in hospital room?: None Help needed climbing 3-5 steps with a railing? : A Little 6 Click Score: 23    End of Session   Activity Tolerance: Patient tolerated treatment well Patient left: in chair Nurse Communication: Mobility status PT Visit Diagnosis: Muscle weakness (generalized) (M62.81);Pain Pain - Right/Left: Left Pain - part of body:  (elbow)    Time: 8413-2440 PT Time Calculation (min) (ACUTE ONLY): 19 min   Charges:    PT Evaluation $PT Eval Low Complexity: 1 Procedure PT Treatments $Therapeutic Exercise: 8-22 mins   PT G Codes:        Steven [name], PT, DPT [phone]   Steven [name] 03/06/2017, 3:28 PM

## 2017-03-06 NOTE — Progress Notes (Signed)
OT Cancellation Note  Patient Details Name: Steven [name] MRN: [id number] DOB: 03/27/1961   Cancelled Treatment:    Reason Eval/Treat Not Completed: Patient at procedure or test/ unavailable. Treatment attempted, however pt currently out of room for vein mapping. Will re-attempt at later date/time as pt is available.  Jeni Salles, MPH, MS, OTR/L ascom [phone] 03/06/17, 11:18 AM

## 2017-03-06 NOTE — Progress Notes (Signed)
PT Cancellation Note  Patient Details Name: IMAAD REUSS MRN: [id number] DOB: [birth year]   Cancelled Treatment:    Reason Eval/Treat Not Completed: Other (comment). Evaluation attempted, however pt currently out of room for vein mapping. Will re-attempt in PM.   Jerre Vandrunen 03/06/2017, 10:38 AM  [name], PT, DPT [phone]

## 2017-03-07 LAB — CULTURE, BLOOD (ROUTINE X 2)
CULTURE: NO GROWTH
CULTURE: NO GROWTH
SPECIAL REQUESTS: ADEQUATE

## 2017-03-07 LAB — GLUCOSE, CAPILLARY
GLUCOSE-CAPILLARY: 186 mg/dL — AB (ref 65–99)
Glucose-Capillary: 246 mg/dL — ABNORMAL HIGH (ref 65–99)
Glucose-Capillary: 211 mg/dL — ABNORMAL HIGH (ref 65–99)

## 2017-03-07 MED ORDER — IRBESARTAN 150 MG PO TABS: 150.0000 mg | ORAL_TABLET | Freq: Every day | ORAL | 0 refills | Status: DC

## 2017-03-07 MED ORDER — INSULIN GLARGINE 100 UNIT/ML ~~LOC~~ SOLN
34.0000 [IU] | Freq: Every day | SUBCUTANEOUS | Status: DC
  Filled 2017-03-07: qty 0.34

## 2017-03-07 MED ORDER — GABAPENTIN 300 MG PO CAPS: 300.0000 mg | ORAL_CAPSULE | Freq: Two times a day (BID) | ORAL | 0 refills | Status: DC

## 2017-03-07 MED ORDER — INSULIN DETEMIR 100 UNIT/ML ~~LOC~~ SOLN: 34.0000 [IU] | Freq: Every day | SUBCUTANEOUS | 1 refills | Status: DC

## 2017-03-07 MED ORDER — LOSARTAN POTASSIUM 50 MG PO TABS: 50.0000 mg | ORAL_TABLET | Freq: Every day | ORAL | 1 refills | Status: DC

## 2017-03-07 MED ORDER — INSULIN ASPART 100 UNIT/ML ~~LOC~~ SOLN
0.0000 [IU] | Freq: Three times a day (TID) | SUBCUTANEOUS | Status: DC
  Administered 2017-03-07: 5 [IU] via SUBCUTANEOUS
  Filled 2017-03-07: qty 5

## 2017-03-07 MED ORDER — INSULIN ASPART 100 UNIT/ML ~~LOC~~ SOLN
4.0000 [IU] | Freq: Three times a day (TID) | SUBCUTANEOUS | Status: DC
  Administered 2017-03-07: 4 [IU] via SUBCUTANEOUS
  Filled 2017-03-07: qty 4

## 2017-03-07 MED ORDER — INSULIN ASPART 100 UNIT/ML ~~LOC~~ SOLN: 0.0000 [IU] | Freq: Every day | SUBCUTANEOUS | Status: DC

## 2017-03-07 MED ORDER — AMOXICILLIN-POT CLAVULANATE 875-125 MG PO TABS: 1.0000 | ORAL_TABLET | Freq: Two times a day (BID) | ORAL | 0 refills | Status: AC

## 2017-03-07 MED ORDER — OXYCODONE-ACETAMINOPHEN 5-325 MG PO TABS: 2.0000 | ORAL_TABLET | Freq: Four times a day (QID) | ORAL | 0 refills | Status: DC | PRN

## 2017-03-07 MED ORDER — AMLODIPINE BESYLATE 5 MG PO TABS: 5.0000 mg | ORAL_TABLET | Freq: Every day | ORAL | 0 refills | Status: DC

## 2017-03-07 MED ORDER — FUROSEMIDE 80 MG PO TABS: 80.0000 mg | ORAL_TABLET | Freq: Every day | ORAL | 0 refills | Status: DC

## 2017-03-07 MED ORDER — INSULIN GLARGINE 100 UNIT/ML ~~LOC~~ SOLN: 34.0000 [IU] | Freq: Every day | SUBCUTANEOUS | 1 refills | Status: DC

## 2017-03-07 MED ORDER — INSULIN ASPART 100 UNIT/ML ~~LOC~~ SOLN: 4.0000 [IU] | Freq: Three times a day (TID) | SUBCUTANEOUS | 1 refills | Status: DC

## 2017-03-07 MED ORDER — METOPROLOL TARTRATE 25 MG PO TABS: 25.0000 mg | ORAL_TABLET | Freq: Two times a day (BID) | ORAL | 0 refills | Status: DC

## 2017-03-07 NOTE — Care Management (Signed)
Patient discharging home today.  [employer] charity wound vac to be delivered at 1600. Home health orders placed for RN and SW.  Corene Cornea with [facility] notified of discharge plan.  Patient to have wound vac changes MWF.  Patient outpatient HD schedule at [facility] MWF at 1215 pm.  First outpatient session tomorrow.  OT has worked with patient and provided him exercises to continue working on at home as patient does not qualify for charity OT services.  Patient's PCP appointment is scheduled for next Friday, there were no other appointments available.  Outpatient HD is aware, and will accommodate this appointment.  Prescriptions were faxed to Medication Management.  Patient's sister to pick up prescriptions prior to discharge at no cost.  Patient to pick up pain medication at [facility].  Patient provided coupon from [email or web address] cost $13 RNCM signing off

## 2017-03-07 NOTE — Progress Notes (Signed)
Hospital wound vac changed to home vac.

## 2017-03-07 NOTE — Progress Notes (Signed)
IV was removed. Discharge instructions, follow-up appointments, and prescriptions were provided to the pt. All questions were answered. The pt was taken downstairs via wheelchair by NT.  

## 2017-03-07 NOTE — Progress Notes (Signed)
Steven [name] at [place] NAME: Steven [name]    MR#:  [id number]  DATE OF BIRTH:  1960-11-18  SUBJECTIVE:   Patient came in with foul smelling ulcer and drainage with the right foot. He is status post right foot incision and drainage with debridement of the abscess.   Wound vac in place. permacath in place.  REVIEW OF SYSTEMS:   Review of Systems  Constitutional: Negative for chills, fever and weight loss.  HENT: Negative for ear discharge, ear pain and nosebleeds.   Eyes: Negative for blurred vision, pain and discharge.  Respiratory: Negative for sputum production, shortness of breath, wheezing and stridor.   Cardiovascular: Positive for leg swelling. Negative for chest pain, palpitations, orthopnea and PND.  Gastrointestinal: Negative for abdominal pain, diarrhea, nausea and vomiting.  Genitourinary: Negative for frequency and urgency.  Musculoskeletal: Negative for back pain and joint pain.  Neurological: Positive for weakness. Negative for sensory change, speech change and focal weakness.  Psychiatric/Behavioral: Negative for depression and hallucinations. The patient is not nervous/anxious.    Tolerating Diet:yes Tolerating PT: pending  DRUG ALLERGIES:   Allergies  Allergen Reactions  . Codeine Nausea And Vomiting    VITALS:  Blood pressure (!) 147/81, pulse 79, temperature 98.2 F (36.8 C), temperature source Oral, resp. rate 15, height 5\' 10"  (1.778 m), weight (!) 149.1 kg (328 lb 11.3 oz), SpO2 98 %.  PHYSICAL EXAMINATION:   Physical Exam  GENERAL:  56 y.o.-year-old patient lying in the bed with no acute distress. obese EYES: Pupils equal, round, reactive to light and accommodation. No scleral icterus. Extraocular muscles intact.  HEENT: Head atraumatic, normocephalic. Oropharynx and nasopharynx clear.  NECK:  Supple, no jugular venous distention. No thyroid enlargement, no tenderness.  LUNGS: Normal breath sounds  bilaterally, no wheezing, rales, rhonchi. No use of accessory muscles of respiration.    permacath in place. CARDIOVASCULAR: S1, S2 normal. No murmurs, rubs, or gallops.  ABDOMEN: Soft, nontender, nondistended. Bowel sounds present. No organomegaly or mass.  EXTREMITIES: Surgical dressing with wound VAC NEUROLOGIC: Cranial nerves II through XII are intact. No focal Motor or sensory deficits b/l.   PSYCHIATRIC:  patient is alert and verbal commands but sleepy secondary to recent sedation SKIN: No obvious rash, lesion, or ulcer.   LABORATORY PANEL:  CBC  Recent Labs Lab 03/06/17 0446  WBC 8.3  HGB 9.1*  HCT 27.3*  PLT 284    Chemistries   Recent Labs Lab 03/05/17 1400  NA 139  K 3.7  CL 102  CO2 28  GLUCOSE 213*  BUN 25*  CREATININE 4.89*  CALCIUM 8.2*   Cardiac Enzymes No results for input(s): TROPONINI in the last 168 hours. RADIOLOGY:  No results found. ASSESSMENT AND PLAN:   Steven [name]  is a 56 y.o. male with a known history of Chronic kidney disease, diabetes, high cholesterol, hypertension- had a fall 6 weeks ago and broke his elbow, went to his primary care doctor's office, x-rays showed Comminuted fracture and was advised to go to a tertiary care center and special. Steven [name].  * Right diabetic foot abscess/Cellulitis/ulcer  With foul smelling ulcers and severe edema on right foot, possible osteomyelitis - IV vancomycin and Zosyn---now on IV unasyn as per [facility]  -Podiatry consult appreciated. Patient is status post I and D of abscess and debridement. Will need wound vac for several days, as per podiatry- one more week. -Infectious disease consultation noted - may switch to oral augmentin  on d/c. - wound vac is arranged by CM- will be delivered tomorrow.  * Acute on chronic renal failure, CKD stage V--- now ESRD - Nephrology consulted.  -Patient started on hemodialysis this hospitalization - hold nephrotoxic medication. -Temporary cath  placed on May  24 by Dr Lucky Cowboy Steven [name] 03/05/17.   Need arrangements of out pt HD. - still waiting for approval as per CM.  * Hypertension Amlodipine and hydralazin.  * Diabetes--poorly uncontrolled   Insulin sliding scale coverage and Lantus.  Seen by Diabetes coordinator  Pt had very poorly controlled DM   Now better controlled on short and long acting insuline.  * Anemia   Likely due to chronic kidney disease.  * elbow fracture   Appreciated orthopedic consult- suggested to see hand and elbow surgeon at [facility].   PT and OT.  Case discussed with Care Management/Social Worker. Management plans discussed with the patient, family and they are in agreement.  CODE STATUS: FULL  DVT Prophylaxis: Heparin  TOTAL TIME TAKING CARE OF THIS PATIENT: *30* minutes.  >50% time spent on counselling and coordination of care  POSSIBLE D/C IN 3-4DAYS, DEPENDING ON CLINICAL CONDITION. Need arrangements for out pt HD and further decision about wound vac.  Note: This dictation was prepared with Dragon dictation along with smaller phrase technology. Any transcriptional errors that result from this process are unintentional.  Vaughan Basta M.D on 03/07/2017 at 2:29 PM  Between 7am to 6pm - Pager - [phone]  After 6pm go to [email or web address] - password [email or web address] [facility]  Office  [phone]  CC: Primary care physician; [name], MD

## 2017-03-07 NOTE — Discharge Summary (Addendum)
[facility] NAME: Steven [name]    MR#:  [id number]  DATE OF BIRTH:  1961-04-04  DATE OF ADMISSION:  02/27/2017 ADMITTING PHYSICIAN: Vaughan Basta, MD  DATE OF DISCHARGE: 03/07/2017  PRIMARY CARE PHYSICIAN: [name], MD    ADMISSION DIAGNOSIS:  Hyperkalemia [E87.5] Abscess or cellulitis of foot [L03.119, L02.619] Renal failure, unspecified chronicity [N19]  DISCHARGE DIAGNOSIS:  Principal Problem:   Cellulitis in diabetic foot ([place]) Active Problems:   Acute on chronic renal failure ([place])   Cellulitis   SECONDARY DIAGNOSIS:   Past Medical History:  Diagnosis Date  . Cellulitis and abscess of right lower extremity 03/01/2017  . Chronic kidney disease   . Diabetes mellitus without complication ([place])   . Edema extremities 03/01/2017   bilateral swelling  . High cholesterol   . High triglycerides   . Hypertension   . Sleep apnea    can't use cpap d/t feelings of suffocation    HOSPITAL COURSE:   [name] a 56 y.o.malewith a known history of Chronic kidney disease, diabetes, high cholesterol, hypertension- had a fall 6 weeks agoand broke his elbow, went to his primary care doctor's office, x-rays showed Comminutedfracture and was advised to go to a tertiary care center and special. [name].  * Right diabetic foot abscess/Cellulitis/ulcer  With foul smelling ulcers and severe edema on right foot, possible osteomyelitis -IV vancomycin and Zosyn---now on IV unasyn as per [facility]  -Podiatry consult appreciated. Patient is status post I and D of abscess and debridement. Will need wound vac for several days, as per podiatry- one more week. -Infectious disease consultation noted - switch to oral augmentin on d/c. - wound vac is arranged by CM.  * Acute on chronic renal failure, CKDstage V--- now ESRD -Nephrology consulted.  -Patient started on hemodialysis this hospitalization -  hold nephrotoxic medication. -Temporary cath  placed on May 24 by Dr Lucky Cowboy Andris Flurry 03/05/17.   Need arrangements of out pt HD. - Received approval as per CM.  * Hypertension Amlodipine and hydralazin.  * Diabetes--poorly uncontrolled Insulin sliding scale coverage and Lantus.  Seen by Diabetes coordinator  Pt had very poorly controlled DM   Now better controlled on short and long acting insuline.  * Anemia Likely due to chronic kidney disease.  * elbow fracture   Appreciated orthopedic consult- suggested to see hand and elbow surgeon at [facility].   PT and OT.   DISCHARGE CONDITIONS:   Stable.  CONSULTS OBTAINED:  Treatment Team:  Lavonia Dana, MD Schnier, Dolores Lory, MD Troxler, Rodman Key, DPM Leonel Ramsay, MD  DRUG ALLERGIES:   Allergies  Allergen Reactions  . Codeine Nausea And Vomiting    DISCHARGE MEDICATIONS:   Current Discharge Medication List    START taking these medications   Details  amoxicillin-clavulanate (AUGMENTIN) 875-125 MG tablet Take 1 tablet by mouth 2 (two) times daily. Qty: 28 tablet, Refills: 0    gabapentin (NEURONTIN) 300 MG capsule Take 1 capsule (300 mg total) by mouth 2 (two) times daily. Qty: 60 capsule, Refills: 0    insulin aspart (NOVOLOG) 100 UNIT/ML injection Inject 4 Units into the skin 3 (three) times daily with meals. Qty: 10 mL, Refills: 1    insulin detemir (LEVEMIR) 100 UNIT/ML injection Inject 0.34 mLs (34 Units total) into the skin daily. Qty: 10 mL, Refills: 1    losartan (COZAAR) 50 MG tablet Take 1 tablet (50 mg total) by mouth daily. Qty:  30 tablet, Refills: 1    metoprolol tartrate (LOPRESSOR) 25 MG tablet Take 1 tablet (25 mg total) by mouth 2 (two) times daily. Qty: 60 tablet, Refills: 0      CONTINUE these medications which have CHANGED   Details  amLODipine (NORVASC) 5 MG tablet Take 1 tablet (5 mg total) by mouth daily. TAKE ONE TABLET BY MOUTH ONCE DAILY --  **NEEDS  VISIT  WITH   PRIMARY  CARE  PROVIDER  BEFORE  REFILLS  GIVEN** Qty: 30 tablet, Refills: 0    furosemide (LASIX) 80 MG tablet Take 1 tablet (80 mg total) by mouth daily. Qty: 30 tablet, Refills: 0    oxyCODONE-acetaminophen (PERCOCET/ROXICET) 5-325 MG tablet Take 2 tablets by mouth every 6 (six) hours as needed for moderate pain or severe pain. Qty: 30 tablet, Refills: 0      CONTINUE these medications which have NOT CHANGED   Details  aspirin EC 81 MG tablet TAKE ONE  BY MOUTH EVERY DAY Qty: 30 tablet, Refills: 10    glucose blood (ACCU-CHEK AVIVA) test strip Use as instructed.use to check blood sugar up to 3 times a day  Disp: 1 box Qty: 100 each, Refills: 11    Insulin Pen Needle 31G X 8 MM MISC 1 Container by Does not apply route QID. qid--any brand Qty: 100 each, Refills: 11    Insulin Syringes, Disposable, U-100 1 ML MISC Use as directed with insulin Qty: 100 each, Refills: 3   Associated Diagnoses: Type 2 diabetes mellitus without complication (HCC)    Lancets (ACCU-CHEK MULTICLIX) lancets Use as instructed.  check blood sugar up to 3 times a day Disp:  1 box Qty: 100 each, Refills: 11      STOP taking these medications     HYDRALAZINE HCL PO      HYDROcodone-acetaminophen (NORCO/VICODIN) 5-325 MG tablet      ibuprofen (ADVIL,MOTRIN) 800 MG tablet      insulin aspart protamine- aspart (NOVOLOG MIX 70/30) (70-30) 100 UNIT/ML injection      lisinopril (PRINIVIL,ZESTRIL) 40 MG tablet      insulin lispro (HUMALOG) 100 UNIT/ML injection      lisinopril-hydrochlorothiazide (PRINZIDE,ZESTORETIC) 20-25 MG per tablet          DISCHARGE INSTRUCTIONS:    Follow with Podiatry clinic and Nephrology clinic in 1-2 weeks.  If you experience worsening of your admission symptoms, develop shortness of breath, life threatening emergency, suicidal or homicidal thoughts you must seek medical attention immediately by calling 911 or calling your MD immediately  if symptoms less severe.  You  Must read complete instructions/literature along with all the possible adverse reactions/side effects for all the Medicines you take and that have been prescribed to you. Take any new Medicines after you have completely understood and accept all the possible adverse reactions/side effects.   Please note  You were cared for by a hospitalist during your hospital stay. If you have any questions about your discharge medications or the care you received while you were in the hospital after you are discharged, you can call the unit and asked to speak with the hospitalist on call if the hospitalist that took care of you is not available. Once you are discharged, your primary care physician will handle any further medical issues. Please note that NO REFILLS for any discharge medications will be authorized once you are discharged, as it is imperative that you return to your primary care physician (or establish a relationship with a primary care  physician if you do not have one) for your aftercare needs so that they can reassess your need for medications and monitor your lab values.    Today   CHIEF COMPLAINT:   Chief Complaint  Patient presents with  . Wound Check  . Leg Swelling  . Elbow Injury    HISTORY OF PRESENT ILLNESS:  Jakevious Hollister  is a 56 y.o. male with a known history of Chronic kidney disease, diabetes, high cholesterol, hypertension- had a fall 6 weeks ago and broke his elbow, went to his primary care doctor's office, x-rays showed Comminuted fracture and was advised to go to a tertiary care center and special. [name]. Due to not having insurance he could not see that orthopedic practice there and so waiting to work with his insurance issue and finally see the orthopedic doctors. He has chronic swelling on both his legs and his friend helping to take care of his feet for last few days his friend noted that his right [name] was getting red and more swollen, also noted some yellowish  discharge from there. Today he went to see his kidney Dr.lateef in office and he saw his feet and suggested to go to emergency room right away. In ER he is noted to have worsening in his kidney function and possible osteomyelitis on x-ray on his feet.   VITAL SIGNS:  Blood pressure 139/80, pulse 81, temperature 98.2 F (36.8 C), temperature source Oral, resp. rate 15, height 5\' 10"  (1.778 m), weight (!) 149.1 kg (328 lb 11.3 oz), SpO2 98 %.  I/O:    Intake/Output Summary (Last 24 hours) at 03/07/17 1531 Last data filed at 03/07/17 1330  Gross per 24 hour  Intake              480 ml  Output                0 ml  Net              480 ml    PHYSICAL EXAMINATION:   GENERAL:  56 y.o.-year-old patient lying in the bed with no acute distress. obese EYES: Pupils equal, round, reactive to light and accommodation. No scleral icterus. Extraocular muscles intact.  HEENT: Head atraumatic, normocephalic. Oropharynx and nasopharynx clear.  NECK:  Supple, no jugular venous distention. No thyroid enlargement, no tenderness.  LUNGS: Normal breath sounds bilaterally, no wheezing, rales, rhonchi. No use of accessory muscles of respiration.    permacath in place. CARDIOVASCULAR: S1, S2 normal. No murmurs, rubs, or gallops.  ABDOMEN: Soft, nontender, nondistended. Bowel sounds present. No organomegaly or mass.  EXTREMITIES: Surgical dressing with wound VAC NEUROLOGIC: Cranial nerves II through XII are intact. No focal Motor or sensory deficits b/l.   PSYCHIATRIC:  patient is alert and verbal commands but sleepy secondary to recent sedation SKIN: No obvious rash, lesion, or ulcer.   DATA REVIEW:   CBC  Recent Labs Lab 03/06/17 0446  WBC 8.3  HGB 9.1*  HCT 27.3*  PLT 284    Chemistries   Recent Labs Lab 03/05/17 1400  NA 139  K 3.7  CL 102  CO2 28  GLUCOSE 213*  BUN 25*  CREATININE 4.89*  CALCIUM 8.2*    Cardiac Enzymes No results for input(s): TROPONINI in the last 168  hours.  Microbiology Results  Results for orders placed or performed during the hospital encounter of 02/27/17  Blood culture (routine x 2)     Status: None   Collection Time:  02/27/17  7:09 PM  Result Value Ref Range Status   Specimen Description BLOOD RIGHT HAND  Final   Special Requests Blood Culture adequate volume  Final   Culture NO GROWTH 5 DAYS  Final   Report Status 03/04/2017 FINAL  Final  Blood culture (routine x 2)     Status: None   Collection Time: 02/27/17  7:09 PM  Result Value Ref Range Status   Specimen Description BLOOD RIGHT [facility]  Final   Special Requests Blood Culture adequate volume  Final   Culture NO GROWTH 5 DAYS  Final   Report Status 03/04/2017 FINAL  Final  Aerobic/Anaerobic Culture (surgical/deep wound)     Status: None (Preliminary result)   Collection Time: 02/28/17  3:30 PM  Result Value Ref Range Status   Specimen Description FOOT  Final   Special Requests Immunocompromised  Final   Gram Stain   Final    MODERATE WBC PRESENT,BOTH PMN AND MONONUCLEAR ABUNDANT GRAM NEGATIVE RODS ABUNDANT GRAM POSITIVE COCCI FEW GRAM POSITIVE RODS    Culture   Final    ABUNDANT STREPTOCOCCUS GROUP G ABUNDANT ENTEROCOCCUS FAECALIS HOLDING FOR POSSIBLE ANAEROBE Performed at [facility], [place] [address] [place], [place] [address]    Report Status PENDING  Incomplete   Organism ID, Bacteria ENTEROCOCCUS FAECALIS  Final      Susceptibility   Enterococcus faecalis - MIC*    AMPICILLIN <=2 SENSITIVE Sensitive     VANCOMYCIN 1 SENSITIVE Sensitive     GENTAMICIN SYNERGY SENSITIVE Sensitive     * ABUNDANT ENTEROCOCCUS FAECALIS  Surgical PCR screen     Status: Abnormal   Collection Time: 02/28/17  5:00 PM  Result Value Ref Range Status   MRSA, PCR NEGATIVE NEGATIVE Final   Staphylococcus aureus POSITIVE (A) NEGATIVE Final    Comment:        The Xpert SA Assay (FDA approved for NASAL specimens in patients over 47 years of age), is one component of a  comprehensive surveillance program.  Test performance has been validated by [facility] for patients greater than or equal to 38 year old. It is not intended to diagnose infection nor to guide or monitor treatment.   Culture, blood (routine x 2)     Status: None   Collection Time: 03/02/17  8:45 AM  Result Value Ref Range Status   Specimen Description BLOOD RIGHT ASSIST CONTROL  Final   Special Requests   Final    BOTTLES DRAWN AEROBIC AND ANAEROBIC Blood Culture results may not be optimal due to an excessive volume of blood received in culture bottles   Culture NO GROWTH 5 DAYS  Final   Report Status 03/07/2017 FINAL  Final  Culture, blood (routine x 2)     Status: None   Collection Time: 03/02/17  9:11 AM  Result Value Ref Range Status   Specimen Description BLOOD RIGHT ASSIST CONTROL  Final   Special Requests   Final    BOTTLES DRAWN AEROBIC AND ANAEROBIC Blood Culture adequate volume   Culture NO GROWTH 5 DAYS  Final   Report Status 03/07/2017 FINAL  Final    RADIOLOGY:  No results found.  EKG:   Orders placed or performed during the hospital encounter of 02/27/17  . EKG 12-Lead  . EKG 12-Lead      Management plans discussed with the patient, family and they are in agreement.  CODE STATUS:     Code Status Orders  Start     Ordered   02/27/17 2055  Full code  Continuous     02/27/17 2054    Code Status History    Date Active Date Inactive Code Status Order ID Comments User Context   This patient has a current code status but no historical code status.      TOTAL TIME TAKING CARE OF THIS PATIENT: 35 minutes.    Vaughan Basta M.D on 03/07/2017 at 3:31 PM  Between 7am to 6pm - Pager - [phone]  After 6pm go to [email or web address] - password [email or web address] [facility]  Office  [phone]  CC: Primary care physician; [name], MD   Note: This dictation was prepared with Dragon dictation along with smaller phrase  technology. Any transcriptional errors that result from this process are unintentional.

## 2017-03-07 NOTE — Progress Notes (Signed)
Inpatient Diabetes Program Recommendations  AACE/ADA: New Consensus Statement on Inpatient Glycemic Control (2015)  Target Ranges:  Prepandial:   less than 140 mg/dL      Peak postprandial:   less than 180 mg/dL (1-2 hours)      Critically ill patients:  140 - 180 mg/dL   Lab Results  Component Value Date   GLUCAP 186 (H) 03/07/2017   HGBA1C 8.2 (H) 02/28/2017    Review of Glycemic ControlResults for Steven [name], Steven [name] (MRN [id number]) as of 03/07/2017 10:28  Ref. Range 03/06/2017 11:56 03/06/2017 15:59 03/06/2017 16:32 03/06/2017 22:26 03/07/2017 07:51  Glucose-Capillary Latest Ref Range: 65 - 99 mg/dL 238 (H) 212 (H) 236 (H) 257 (H) 186 (H)    Diabetes history: Type 2 diabetes Outpatient Diabetes medications: Novolog mix 70/30 sliding scale 40-150 units bid- self adjusting confirmed with patient  Note from Dr. Netty Starring dates 06/05/17 state patient should be taking 80 units bid Current orders for Inpatient glycemic control:  Lantus 30 units daily, Novolog moderate tid with meals  Inpatient Diabetes Program Recommendations:   May consider increasing Lantus to 34 units daily and add Novolog meal coverage 4 units tid with meals (hold if patient eats less than 50%).    Thanks, Adah Perl, RN, BC-ADM Inpatient Diabetes Coordinator Pager [phone] (8a-5p)

## 2017-03-07 NOTE — Progress Notes (Signed)
[facility]  ROUNDING NOTE   Subjective:   Patient is doing well  no acute events reported Able to eat without nausea or vomiting Wound vac in place  Objective:  Vital signs in last 24 hours:  Temp:  [98 F (36.7 C)-98.6 F (37 C)] 98.2 F (36.8 C) (05/31 0800) Pulse Rate:  [65-101] 79 (05/31 0800) Resp:  [15-18] 15 (05/31 0800) BP: (84-160)/(49-87) 147/81 (05/31 0800) SpO2:  [96 %-98 %] 98 % (05/31 0519)  Weight change:  Filed Weights   03/05/17 0925 03/05/17 1350 03/05/17 1730  Weight: (!) 151.5 kg (334 lb) (!) 151.1 kg (333 lb 1.8 oz) (!) 149.1 kg (328 lb 11.3 oz)    Intake/Output: I/O last 3 completed shifts: In: 685 [P.O.:600; IV Piggyback:85] Out: 450 [Urine:450]   Intake/Output this shift:  Total I/O In: 240 [P.O.:240] Out: -   Physical Exam: General: NAD, sitting up   Head: Normocephalic, atraumatic. Moist oral mucosal membranes  Eyes: Anicteric,    Neck: Supple, trachea midline  Lungs:  Clear to auscultation  Heart: Regular rate and rhythm  Abdomen:  Soft, nontender, obese  Extremities: + peripheral edema, right foot wound vac,    Neurologic: Nonfocal, moving all four extremities     Access: IJ Permcath    Basic Metabolic Panel:  Recent Labs Lab 03/01/17 0446 03/01/17 1254 03/02/17 0845 03/05/17 1400  NA  --  138 139 139  K 4.6 4.0 4.1 3.7  CL  --  102 101 102  CO2  --  28 29 28   GLUCOSE  --  190* 231* 213*  BUN  --  44* 37* 25*  CREATININE  --  6.03* 5.73* 4.89*  CALCIUM  --  7.8* 8.0* 8.2*  PHOS  --  5.4* 5.1* 4.4    Liver Function Tests:  Recent Labs Lab 03/01/17 1254 03/02/17 0845 03/05/17 1400  ALBUMIN 2.1* 2.2* 2.2*   No results for input(s): LIPASE, AMYLASE in the last 168 hours. No results for input(s): AMMONIA in the last 168 hours.  CBC:  Recent Labs Lab 03/01/17 1254 03/02/17 0845 03/05/17 1400 03/06/17 0446  WBC 6.0 8.3 8.3 8.3  HGB 8.5* 8.8* 9.0* 9.1*  HCT 25.2* 26.2* 27.0* 27.3*  MCV 86.5  85.3 85.7 86.2  PLT 302 315 295 284    Cardiac Enzymes: No results for input(s): CKTOTAL, CKMB, CKMBINDEX, TROPONINI in the last 168 hours.  BNP: Invalid input(s): POCBNP  CBG:  Recent Labs Lab 03/06/17 1559 03/06/17 1632 03/06/17 2226 03/07/17 0751 03/07/17 1155  GLUCAP 212* 236* 257* 186* 246*    Microbiology: Results for orders placed or performed during the hospital encounter of 02/27/17  Blood culture (routine x 2)     Status: None   Collection Time: 02/27/17  7:09 PM  Result Value Ref Range Status   Specimen Description BLOOD RIGHT HAND  Final   Special Requests Blood Culture adequate volume  Final   Culture NO GROWTH 5 DAYS  Final   Report Status 03/04/2017 FINAL  Final  Blood culture (routine x 2)     Status: None   Collection Time: 02/27/17  7:09 PM  Result Value Ref Range Status   Specimen Description BLOOD RIGHT [facility]  Final   Special Requests Blood Culture adequate volume  Final   Culture NO GROWTH 5 DAYS  Final   Report Status 03/04/2017 FINAL  Final  Aerobic/Anaerobic Culture (surgical/deep wound)     Status: None (Preliminary result)   Collection Time: 02/28/17  3:30 PM  Result Value Ref Range Status   Specimen Description FOOT  Final   Special Requests Immunocompromised  Final   Gram Stain   Final    MODERATE WBC PRESENT,BOTH PMN AND MONONUCLEAR ABUNDANT GRAM NEGATIVE RODS ABUNDANT GRAM POSITIVE COCCI FEW GRAM POSITIVE RODS    Culture   Final    ABUNDANT STREPTOCOCCUS GROUP G ABUNDANT ENTEROCOCCUS FAECALIS HOLDING FOR POSSIBLE ANAEROBE Performed at [facility], [place] [address] [place], [place] [address]    Report Status PENDING  Incomplete   Organism ID, Bacteria ENTEROCOCCUS FAECALIS  Final      Susceptibility   Enterococcus faecalis - MIC*    AMPICILLIN <=2 SENSITIVE Sensitive     VANCOMYCIN 1 SENSITIVE Sensitive     GENTAMICIN SYNERGY SENSITIVE Sensitive     * ABUNDANT ENTEROCOCCUS FAECALIS  Surgical PCR screen     Status: Abnormal    Collection Time: 02/28/17  5:00 PM  Result Value Ref Range Status   MRSA, PCR NEGATIVE NEGATIVE Final   Staphylococcus aureus POSITIVE (A) NEGATIVE Final    Comment:        The Xpert SA Assay (FDA approved for NASAL specimens in patients over 55 years of age), is one component of a comprehensive surveillance program.  Test performance has been validated by [facility] for patients greater than or equal to 79 year old. It is not intended to diagnose infection nor to guide or monitor treatment.   Culture, blood (routine x 2)     Status: None   Collection Time: 03/02/17  8:45 AM  Result Value Ref Range Status   Specimen Description BLOOD RIGHT ASSIST CONTROL  Final   Special Requests   Final    BOTTLES DRAWN AEROBIC AND ANAEROBIC Blood Culture results may not be optimal due to an excessive volume of blood received in culture bottles   Culture NO GROWTH 5 DAYS  Final   Report Status 03/07/2017 FINAL  Final  Culture, blood (routine x 2)     Status: None   Collection Time: 03/02/17  9:11 AM  Result Value Ref Range Status   Specimen Description BLOOD RIGHT ASSIST CONTROL  Final   Special Requests   Final    BOTTLES DRAWN AEROBIC AND ANAEROBIC Blood Culture adequate volume   Culture NO GROWTH 5 DAYS  Final   Report Status 03/07/2017 FINAL  Final    Coagulation Studies: No results for input(s): LABPROT, INR in the last 72 hours.  Urinalysis: No results for input(s): COLORURINE, LABSPEC, PHURINE, GLUCOSEU, HGBUR, BILIRUBINUR, KETONESUR, PROTEINUR, UROBILINOGEN, NITRITE, LEUKOCYTESUR in the last 72 hours.  Invalid input(s): APPERANCEUR    Imaging: No results found.   Medications:   . ampicillin-sulbactam (UNASYN) IV Stopped (03/07/17 1202)   . aspirin EC  81 mg Oral Daily  . epoetin (EPOGEN/PROCRIT) injection  4,000 Units Intravenous Q T,Th,Sa-HD  . furosemide  80 mg Oral Daily  . gabapentin  300 mg Oral BID  . heparin  5,000 Units Subcutaneous Q8H  . insulin aspart   0-15 Units Subcutaneous TID WC  . insulin aspart  0-5 Units Subcutaneous QHS  . insulin aspart  4 Units Subcutaneous TID WC  . [START ON 03/08/2017] insulin glargine  34 Units Subcutaneous Daily  . irbesartan  150 mg Oral QHS  . living well with diabetes book   Does not apply Once  . metoprolol tartrate  25 mg Oral BID   acetaminophen, docusate sodium, oxyCODONE-acetaminophen  Assessment/ Plan:  Mr. Steven  RYON [name] is a 56 y.o. white male  with hypertension, diabetes mellitus type 2 insulin dependent, hyperlipidemia, GERD, and morbid obesity  1. 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 No biopsy.  Hyperkalemia on admission Initiated on hemodialysis on this admission. First dialysis was 5/24. Tolerated treatment well.  - PPD negative 5/26.  - new perncath placed 5/29 - d/c planning in progress- Hiltonia Davita- MWF-2 shift to be confirmed by patient pathways - vein mapping completed  2. Hypertension: blood pressure at goal.   - started on  ARB yesterday  3. Anemia of chronic kidney disease: hemoglobin 9.1 - EPO with Hemodialysis treatment   4. Secondary Hyperparathyroidism: calcium at goal. Not currently on vitamin D. PTH 121.   - low phos diet.   5. Diabetes mellitus type II with chronic kidney disease: hemoglobin Q6V 7.8% complicated with with diabetic foot ulcer right foot status post I&D 5/25 Dr. Elvina Mattes. Wound vac - Appreciate ID and podiatry input.  - Continue glucose control.    6. Left olecrenon fracture: working with OT - Appreciate ortho input. Patient will need to see an elbow specialist.     LOS: 8 Dolora Ridgely 5/31/20181:31 PM

## 2017-03-07 NOTE — Progress Notes (Signed)
Occupational Therapy Treatment Patient Details Name: Steven [name] MRN: [id number] DOB: [birth year] Today's Date: 03/07/2017    History of present illness Pt. is a 56 y.o. male who was admitted to [facility] with Cellulitis, and abscess of right LE, CKD, DM, Edema bilateral LE's High Cholesterol, HTN, Sleep Apnea, and new onset of ESRD.  Approximately 6 weeks ago, Pt. sustained a Left elbow Fracture, Displacement of the Left radial head, subluxation of the elbow. Of note, pt now with permcath placed on 03/05/17 and temp cath removed on 5/30.    OT comments  Pt seen for OT session today focused on self care skills and ROM/exercises with LUE. Pt reported mild pain in L elbow at end range of movements but able to fully participate in UB and LB bathing using BUE with only minimal assist required to wash back while pt sat EOB. Pt demonstrated improved ROM, strength, and functional independence with self care tasks using LUE with decrease in pain per pt report. OT reviewed LUE exercises to perform at home and pt able to return demonstrate technique and verbalize his plan for completing. Pt reports being extremely motivated to keep up with his exercises in order to avoid surgery, as well as eating better, adhering to dialysis schedule on outpatient basis, and generally "wanting to live better for my daughter". Pt provided with ECS handout to support fatigue mgt, functional independence with ADL and IADL, work simplification strategies, home/routines modifications, and minimize falls risk. Pt reported some fatigue at end of session with no increase in L elbow pain. Continue to progress towards OT goals.    Follow Up Recommendations  Home health OT    Equipment Recommendations       Recommendations for Other Services      Precautions / Restrictions Precautions Precautions: Fall Restrictions Weight Bearing Restrictions: No       Mobility Bed Mobility Overal bed mobility: Modified Independent             General bed mobility comments: no physical assist required, additional time/effort to perform with HOB elevated  Transfers Overall transfer level: Needs assistance Equipment used: None Transfers: Sit to/from Stand Sit to Stand: Supervision         General transfer comment: supervision during bathing, safe technique, no LOB    Balance Overall balance assessment: Needs assistance;History of Falls Sitting-balance support: Feet supported;No upper extremity supported Sitting balance-Leahy Scale: Normal     Standing balance support: No upper extremity supported;During functional activity Standing balance-Leahy Scale: Good Standing balance comment: during perianal hygiene/bathing in standing                           ADL either performed or assessed with clinical judgement   ADL Overall ADL's : Needs assistance/impaired     Grooming: Sitting;Set up;Wash/dry face;Applying deodorant;Brushing hair Grooming Details (indicate cue type and reason): pt performed seated EOB with set up of washcloth, able to perform with LUE and RUE independently, just slightly uncomfortable using LUE Upper Body Bathing: Sitting;Set up;Minimal assistance Upper Body Bathing Details (indicate cue type and reason): min assist to wash back, pt able to wash chest and BUE with R and L UE without assist, slight discomfort with L elbow when washing R underarm and R shoulder Lower Body Bathing: Supervison/ safety;Sit to/from stand Lower Body Bathing Details (indicate cue type and reason): pt able to perform LB bathing with supervision while in standing to perform perianal hygiene/bathing Upper Body Dressing :  Set up;Sitting                     General ADL Comments: pt improving with functional independence with bathing, dressing, grooming tasks with improved functional ability using LUE with greater ROM, strength, and less pain/discomfort     Vision Baseline Vision/History: No visual  deficits Patient Visual Report: No change from baseline Vision Assessment?: No apparent visual deficits   Perception     Praxis      Cognition Arousal/Alertness: Awake/alert Behavior During Therapy: WFL for tasks assessed/performed Overall Cognitive Status: Within Functional Limits for tasks assessed                                          Exercises Other Exercises Other Exercises: Elbow PROM/AROM x15 reps including supination/pronation, flexion/extension, shoulder flex/extension, shoulder ab/adduction, horizontal ab/adduction, pt able to return demo functional ROM in order to apply deoderant under R underarm, wash RUE with LUE Other Exercises: Energy conservation handout provided to support falls prevention and functional independence with ADL, fatigue mgt.   Shoulder Instructions       General Comments bandaging, wound vac in place for R foot    Pertinent Vitals/ Pain       Pain Assessment: 0-10 Pain Score: 3  Pain Location: end range of elbow movement Pain Descriptors / Indicators: Aching Pain Intervention(s): Limited activity within patient's tolerance;Monitored during session;Repositioned  Home Living                                          Prior Functioning/Environment              Frequency  Min 1X/week        Progress Toward Goals  OT Goals(current goals can now be found in the care plan section)  Progress towards OT goals: Progressing toward goals  Acute Rehab OT Goals Patient Stated Goal: to return back to work  Plan Discharge plan remains appropriate;Frequency remains appropriate    Co-evaluation                 AM-PAC PT "6 Clicks" Daily Activity     Outcome Measure   Help from another person eating meals?: None Help from another person taking care of personal grooming?: None Help from another person toileting, which includes using toliet, bedpan, or urinal?: A Little Help from another person  bathing (including washing, rinsing, drying)?: A Little Help from another person to put on and taking off regular upper body clothing?: None Help from another person to put on and taking off regular lower body clothing?: A Little 6 Click Score: 21    End of Session    OT Visit Diagnosis: Muscle weakness (generalized) (M62.81)   Activity Tolerance Patient tolerated treatment well   Patient Left in bed;with call bell/phone within reach;with bed alarm set   Nurse Communication          Time: 1333-1430 OT Time Calculation (min): 57 min  Charges: OT General Charges $OT Visit: 1 Procedure OT Treatments $Self Care/Home Management : 53-67 mins  Jeni Salles, MPH, MS, OTR/L ascom [phone] 03/07/17, 2:39 PM

## 2017-03-07 NOTE — Discharge Instructions (Signed)
Follow with podiatry clinic in 1 week.

## 2017-03-08 LAB — AEROBIC/ANAEROBIC CULTURE W GRAM STAIN (SURGICAL/DEEP WOUND)

## 2017-03-08 LAB — AEROBIC/ANAEROBIC CULTURE (SURGICAL/DEEP WOUND)

## 2017-03-27 ENCOUNTER — Other Ambulatory Visit (INDEPENDENT_AMBULATORY_CARE_PROVIDER_SITE_OTHER): Payer: Self-pay | Admitting: Vascular Surgery

## 2017-03-27 ENCOUNTER — Encounter (INDEPENDENT_AMBULATORY_CARE_PROVIDER_SITE_OTHER): Payer: Self-pay

## 2017-03-28 MED ORDER — CEFAZOLIN SODIUM-DEXTROSE 1-4 GM/50ML-% IV SOLN
1.0000 g | Freq: Once | INTRAVENOUS | Status: AC
  Administered 2017-03-29: 1 g via INTRAVENOUS

## 2017-03-29 ENCOUNTER — Ambulatory Visit
Admission: RE | Admit: 2017-03-29 | Discharge: 2017-03-29 | Disposition: A | Discharge status: Home / Self Care | Payer: Medicaid Other | Source: Ambulatory Visit | Attending: Vascular Surgery | Admitting: Vascular Surgery

## 2017-03-29 ENCOUNTER — Encounter
Admission: RE | Disposition: A | Discharge status: Home / Self Care | Payer: Self-pay | Source: Ambulatory Visit | Attending: Vascular Surgery

## 2017-03-29 ENCOUNTER — Ambulatory Visit: Payer: Medicaid Other

## 2017-03-29 DIAGNOSIS — I120 Hypertensive chronic kidney disease with stage 5 chronic kidney disease or end stage renal disease: Secondary | ICD-10-CM | POA: Insufficient documentation

## 2017-03-29 DIAGNOSIS — E781 Pure hyperglyceridemia: Secondary | ICD-10-CM | POA: Diagnosis not present

## 2017-03-29 DIAGNOSIS — E7800 Pure hypercholesterolemia, unspecified: Secondary | ICD-10-CM | POA: Insufficient documentation

## 2017-03-29 DIAGNOSIS — N186 End stage renal disease: Secondary | ICD-10-CM | POA: Insufficient documentation

## 2017-03-29 DIAGNOSIS — Z8249 Family history of ischemic heart disease and other diseases of the circulatory system: Secondary | ICD-10-CM | POA: Insufficient documentation

## 2017-03-29 DIAGNOSIS — G4730 Sleep apnea, unspecified: Secondary | ICD-10-CM | POA: Insufficient documentation

## 2017-03-29 DIAGNOSIS — T82868A Thrombosis of vascular prosthetic devices, implants and grafts, initial encounter: Secondary | ICD-10-CM | POA: Diagnosis not present

## 2017-03-29 DIAGNOSIS — Z992 Dependence on renal dialysis: Secondary | ICD-10-CM | POA: Insufficient documentation

## 2017-03-29 DIAGNOSIS — E1122 Type 2 diabetes mellitus with diabetic chronic kidney disease: Secondary | ICD-10-CM | POA: Diagnosis not present

## 2017-03-29 DIAGNOSIS — Z452 Encounter for adjustment and management of vascular access device: Secondary | ICD-10-CM | POA: Diagnosis present

## 2017-03-29 DIAGNOSIS — T82594A Other mechanical complication of infusion catheter, initial encounter: Secondary | ICD-10-CM

## 2017-03-29 DIAGNOSIS — Z885 Allergy status to narcotic agent status: Secondary | ICD-10-CM | POA: Insufficient documentation

## 2017-03-29 DIAGNOSIS — I10 Essential (primary) hypertension: Secondary | ICD-10-CM | POA: Diagnosis not present

## 2017-03-29 DIAGNOSIS — E119 Type 2 diabetes mellitus without complications: Secondary | ICD-10-CM | POA: Diagnosis not present

## 2017-03-29 DIAGNOSIS — Z9889 Other specified postprocedural states: Secondary | ICD-10-CM | POA: Diagnosis not present

## 2017-03-29 HISTORY — PX: EXCHANGE OF A DIALYSIS CATHETER: SHX5818

## 2017-03-29 LAB — POTASSIUM (ARMC VASCULAR LAB ONLY): Potassium (ARMC vascular lab): 5 (ref 3.5–5.1)

## 2017-03-29 LAB — GLUCOSE, CAPILLARY: Glucose-Capillary: 170 mg/dL — ABNORMAL HIGH (ref 65–99)

## 2017-03-29 SURGERY — EXCHANGE OF A DIALYSIS CATHETER

## 2017-03-29 MED ORDER — MIDAZOLAM HCL 2 MG/2ML IJ SOLN
INTRAMUSCULAR | Status: DC | PRN
  Administered 2017-03-29: 1 mg via INTRAVENOUS
  Administered 2017-03-29 (×2): 2 mg via INTRAVENOUS

## 2017-03-29 MED ORDER — SODIUM CHLORIDE 0.9 % IV SOLN
INTRAVENOUS | Status: DC
  Administered 2017-03-29: 12:00:00 via INTRAVENOUS

## 2017-03-29 MED ORDER — FENTANYL CITRATE (PF) 100 MCG/2ML IJ SOLN
INTRAMUSCULAR | Status: AC
  Filled 2017-03-29: qty 2

## 2017-03-29 MED ORDER — ONDANSETRON HCL 4 MG/2ML IJ SOLN: 4.0000 mg | Freq: Four times a day (QID) | INTRAMUSCULAR | Status: DC | PRN

## 2017-03-29 MED ORDER — LIDOCAINE-EPINEPHRINE (PF) 2 %-1:200000 IJ SOLN
INTRAMUSCULAR | Status: AC
  Filled 2017-03-29: qty 20

## 2017-03-29 MED ORDER — METHYLPREDNISOLONE SODIUM SUCC 125 MG IJ SOLR: 125.0000 mg | INTRAMUSCULAR | Status: DC | PRN

## 2017-03-29 MED ORDER — MIDAZOLAM HCL 5 MG/5ML IJ SOLN
INTRAMUSCULAR | Status: AC
  Filled 2017-03-29: qty 5

## 2017-03-29 MED ORDER — BACITRACIN-NEOMYCIN-POLYMYXIN 400-5-5000 EX OINT
TOPICAL_OINTMENT | CUTANEOUS | Status: AC
  Filled 2017-03-29: qty 1

## 2017-03-29 MED ORDER — FENTANYL CITRATE (PF) 100 MCG/2ML IJ SOLN
INTRAMUSCULAR | Status: DC | PRN
  Administered 2017-03-29 (×3): 50 ug via INTRAVENOUS

## 2017-03-29 MED ORDER — HEPARIN SODIUM (PORCINE) 10000 UNIT/ML IJ SOLN
INTRAMUSCULAR | Status: AC
  Filled 2017-03-29: qty 1

## 2017-03-29 MED ORDER — FAMOTIDINE 20 MG PO TABS: 40.0000 mg | ORAL_TABLET | ORAL | Status: DC | PRN

## 2017-03-29 MED ORDER — HYDROMORPHONE HCL 1 MG/ML IJ SOLN: 1.0000 mg | Freq: Once | INTRAMUSCULAR | Status: DC | PRN

## 2017-03-29 MED ORDER — HEPARIN (PORCINE) IN NACL 2-0.9 UNIT/ML-% IJ SOLN
INTRAMUSCULAR | Status: AC
  Filled 2017-03-29: qty 500

## 2017-03-29 SURGICAL SUPPLY — 8 items
ADH SKN CLS APL DERMABOND .7 (GAUZE/BANDAGES/DRESSINGS) ×2
CATH PALIN MAXID VT KIT 19CM (CATHETERS) ×3 IMPLANT
DERMABOND ADVANCED (GAUZE/BANDAGES/DRESSINGS) ×2
DERMABOND ADVANCED .7 DNX12 (GAUZE/BANDAGES/DRESSINGS) ×1 IMPLANT
GUIDEWIRE SUPER STIFF .035X180 (WIRE) ×3 IMPLANT
PACK ANGIOGRAPHY (CUSTOM PROCEDURE TRAY) ×3 IMPLANT
SUT MNCRL AB 4-0 PS2 18 (SUTURE) ×6 IMPLANT
TOWEL OR 17X26 4PK STRL BLUE (TOWEL DISPOSABLE) ×3 IMPLANT

## 2017-03-29 NOTE — Op Note (Signed)
Steven [name]   OPERATIVE NOTE     PROCEDURE: 1. Exchange right IJ tunneled dialysis catheter over wire same access   PRE-OPERATIVE DIAGNOSIS: Complication of dialysis device with nonfunction of tunneled catheter; end-stage renal requiring hemodialysis  POST-OPERATIVE DIAGNOSIS: same as above  SURGEON: Katha Cabal, M.D.  ANESTHESIA: Conscious sedation was administered under my direct supervision by the interventional radiology RN.  IV Versed plus fentanyl were utilized. Continuous ECG, pulse oximetry and blood pressure was monitored throughout the entire procedure.  Conscious sedation was for a total of 24 minutes.  ESTIMATED BLOOD LOSS: Minimal  FINDING(S): 1.  Tips of the catheter in the right atrium on fluoroscopy 2.  No obvious pneumothorax on fluoroscopy  SPECIMEN(S):  none  INDICATIONS:   Steven [name] is a 56 y.o. male  presents with end stage renal disease.  Therefore, the patient requires a tunneled dialysis catheter placement.  The patient is informed of  the risks catheter placement include but are not limited to: bleeding, infection, central venous injury, pneumothorax, possible venous stenosis, possible malpositioning in the venous system, and possible infections related to long-term catheter presence.  The patient was aware of these risks and agreed to proceed.  DESCRIPTION: The patient was taken back to Special Procedure suite.  Prior to sedation, the patient was given IV antibiotics.  After obtaining adequate sedation, the patient was prepped and draped in the standard fashion for a chest or neck tunneled dialysis catheter placement.  Appropriate Time Out is called.   The the right neck and chest wall are then infiltrated with 1% Lidocaine with epinepherine.  A 19 cm tip to cuff Mahurkar catheter is then selected, opened on the back table and prepped.  The cuff is then freed from surrounding attachments. The catheter is then controlled  with a hemostat and transected above the level of the cuff.  Under fluoroscopy an Amplatz Super Stiff wire is introduced through the catheter and negotiated into the inferior vena cava. The remaining portion of the catheter is then removed without difficulty.  A new catheter is then threaded over the wire and advanced under fluoroscopy so that the tip is in the mid atrium.  Each port was tested by aspirating and flushing.  No resistance was noted.  Each port was then thoroughly flushed with heparinized saline.  The catheter was secured in placed with two interrupted stitches of 0 silk tied to the catheter.   Each port was then packed with concentrated heparin (10,000 Units/mL) at the manufacturer recommended volumes to each port.  Sterile caps were applied to each port.  On completion fluoroscopy, the tips of the catheter were in the right atrium, and there was no evidence of pneumothorax.  COMPLICATIONS: None  CONDITION: Steven [name] 03/29/2017,2:05 PM [facility] Office: [phone]   03/29/2017, 2:05 PM

## 2017-03-29 NOTE — H&P (Signed)
[facility] Admission History & Physical  MRN : [id number]  Steven [name] is a 56 y.o. (1961-04-22) male who presents with chief complaint of catheter not working.  History of Present Illness: I am asked to evaluate the patient by the dialysis center. The patient was sent here because they were unable to achieve adequate dialysis this morning. Furthermore the Center states there is very poor flow in the atheter. The patient states there there have been increasing problems with the access, such as "pulling clots" during dialysis. The patient estimates these problems have been going on for several weeks. The patient is unaware of any other change.  Patient denies pain or tenderness overlying the access.  There is no pain with dialysis.    There have not any past interventions of this access.    Current Facility-Administered Medications  Medication Dose Route Frequency Provider Last Rate Last Dose  . 0.9 %  sodium chloride infusion   Intravenous Continuous Stegmayer, Kimberly A, PA-C 10 mL/hr at 03/29/17 1223    . ceFAZolin (ANCEF) IVPB 1 g/50 mL premix  1 g Intravenous Once Stegmayer, Kimberly A, PA-C      . famotidine (PEPCID) tablet 40 mg  40 mg Oral PRN Stegmayer, Janalyn Harder, PA-C      . HYDROmorphone (DILAUDID) injection 1 mg  1 mg Intravenous Once PRN Stegmayer, Kimberly A, PA-C      . methylPREDNISolone sodium succinate (SOLU-MEDROL) 125 mg/2 mL injection 125 mg  125 mg Intravenous PRN Stegmayer, Kimberly A, PA-C      . ondansetron (ZOFRAN) injection 4 mg  4 mg Intravenous Q6H PRN Stegmayer, Janalyn Harder, PA-C        Past Medical History:  Diagnosis Date  . Cellulitis and abscess of right lower extremity 03/01/2017  . Chronic kidney disease   . Diabetes mellitus without complication ([place])   . Edema extremities 03/01/2017   bilateral swelling  . High cholesterol   . High triglycerides   . Hypertension   . Sleep apnea    can't use cpap d/t feelings of  suffocation    Past Surgical History:  Procedure Laterality Date  . APPENDECTOMY  2010  . DIALYSIS/PERMA CATHETER INSERTION N/A 03/05/2017   Procedure: Dialysis/Perma Catheter Insertion;  Surgeon: Katha Cabal, MD;  Location: [place] [facility];  Service: Cardiovascular;  Laterality: N/A;  . IRRIGATION AND DEBRIDEMENT FOOT Right 03/01/2017   Procedure: IRRIGATION AND DEBRIDEMENT FOOT;  Surgeon: Albertine Patricia, DPM;  Location: [facility];  Service: Podiatry;  Laterality: Right;  application of wound vac    Social History Social History  Substance Use Topics  . Smoking status: Never Smoker  . Smokeless tobacco: Current User    Types: Snuff  . Alcohol use Yes     Comment: up until 01/2017 was heavy drinker...4 40 oz qd    Family History Family History  Problem Relation Age of Onset  . CAD Father     No family history of bleeding or clotting disorders, autoimmune disease or porphyria  Allergies  Allergen Reactions  . Codeine Nausea And Vomiting     REVIEW OF SYSTEMS (Negative unless checked)  Constitutional: [] Weight loss  [] Fever  [] Chills Cardiac: [] Chest pain   [] Chest pressure   [] Palpitations   [] Shortness of breath when laying flat   [] Shortness of breath at rest   [x] Shortness of breath with exertion. Vascular:  [] Pain in legs with walking   [] Pain in legs at rest   [] Pain in  legs when laying flat   [] Claudication   [] Pain in feet when walking  [] Pain in feet at rest  [] Pain in feet when laying flat   [] History of DVT   [] Phlebitis   [] Swelling in legs   [] Varicose veins   [] Non-healing ulcers Pulmonary:   [] Uses home oxygen   [] Productive cough   [] Hemoptysis   [] Wheeze  [] COPD   [] Asthma Neurologic:  [] Dizziness  [] Blackouts   [] Seizures   [] History of stroke   [] History of TIA  [] Aphasia   [] Temporary blindness   [] Dysphagia   [] Weakness or numbness in arms   [] Weakness or numbness in legs Musculoskeletal:  [] Arthritis   [] Joint swelling   [] Joint pain   [] Low back  pain Hematologic:  [] Easy bruising  [] Easy bleeding   [] Hypercoagulable state   [] Anemic  [] Hepatitis Gastrointestinal:  [] Blood in stool   [] Vomiting blood  [] Gastroesophageal reflux/heartburn   [] Difficulty swallowing. Genitourinary:  [x] Chronic kidney disease   [] Difficult urination  [] Frequent urination  [] Burning with urination   [] Blood in urine Skin:  [] Rashes   [] Ulcers   [] Wounds Psychological:  [] History of anxiety   []  History of major depression.  Physical Examination  Vitals:   03/29/17 1208  BP: 134/77  Pulse: 68  Resp: 16  Temp: 98.1 F (36.7 C)  TempSrc: Oral  SpO2: 97%  Weight: (!) 148.8 kg (328 lb)  Height: 5\' 10"  (1.778 m)   Body mass index is 47.06 kg/m. Gen: WD/WN, NAD Head: Learned/AT, No temporalis wasting. Prominent temp pulse not noted. Ear/Nose/Throat: Hearing grossly intact, nares w/o erythema or drainage, oropharynx w/o Erythema/Exudate,  Eyes: Conjunctiva clear, sclera non-icteric Neck: Trachea midline.  No JVD.  Pulmonary:  Good air movement, respirations not labored, no use of accessory muscles.  Cardiac: RRR, normal S1, S2. Vascular:  IJ catheter no redness no drainage Vessel Right Left  Radial Palpable Palpable  Ulnar Not Palpable Not Palpable  Brachial Palpable Palpable  Carotid Palpable, without bruit Palpable, without bruit  Gastrointestinal: soft, non-tender/non-distended. No guarding/reflex.  Musculoskeletal: M/S 5/5 throughout.  Extremities without ischemic changes.  No deformity or atrophy.  Neurologic: Sensation grossly intact in extremities.  Symmetrical.  Speech is fluent. Motor exam as listed above. Psychiatric: Judgment intact, Mood & affect appropriate for pt's clinical situation. Dermatologic: No rashes or ulcers noted.  No cellulitis or open wounds. Lymph : No Cervical, Axillary, or Inguinal lymphadenopathy.   CBC Lab Results  Component Value Date   WBC 8.3 03/06/2017   HGB 9.1 (L) 03/06/2017   HCT 27.3 (L) 03/06/2017   MCV  86.2 03/06/2017   PLT 284 03/06/2017    BMET    Component Value Date/Time   NA 139 03/05/2017 1400   K 3.7 03/05/2017 1400   CL 102 03/05/2017 1400   CO2 28 03/05/2017 1400   GLUCOSE 213 (H) 03/05/2017 1400   BUN 25 (H) 03/05/2017 1400   CREATININE 4.89 (H) 03/05/2017 1400   CREATININE 1.41 (H) 12/07/2011 1030   CALCIUM 8.2 (L) 03/05/2017 1400   GFRNONAA 12 (L) 03/05/2017 1400   GFRAA 14 (L) 03/05/2017 1400   CrCl cannot be calculated (Patient's most recent lab result is older than the maximum 21 days allowed.).  COAG No results found for: INR, PROTIME  Radiology Dg Chest 2 View  Result Date: 02/27/2017 CLINICAL DATA:  56 y/o  M; weakness. EXAM: CHEST  2 VIEW COMPARISON:  None. FINDINGS: The heart size and mediastinal contours are within normal limits. Both lungs are clear.  The visualized skeletal structures are unremarkable. IMPRESSION: No active cardiopulmonary disease. Electronically Signed   By: Kristine Garbe M.D.   On: 02/27/2017 19:21   Dg Elbow Complete Left  Result Date: 02/27/2017 CLINICAL DATA:  Left elbow pain.  Known left elbow fracture. EXAM: LEFT ELBOW - COMPLETE 3+ VIEW COMPARISON:  01/18/2017 and 02/05/2017 FINDINGS: Left elbow is in a fiberglass cast or splint. There appears to be increased impaction of the comminuted radial head fracture. Alignment of the elbow is abnormal with posterior subluxation of the elbow. Again noted is a displaced fracture involving the distal humerus lateral epicondyle. There also appears to be a fracture along the medial aspect of the olecranon or possibly the coronoid process. IMPRESSION: Abnormal alignment of the left elbow with posterior subluxation. Increased displacement and impaction of the radial head fracture. There appears to be additional fractures involving the distal humeral lateral epicondyle and probably the olecranon or coronoid process. Electronically Signed   By: Markus Daft M.D.   On: 02/27/2017 19:31   Dg Foot  Complete Right  Result Date: 02/27/2017 CLINICAL DATA:  Wound at the right third through fifth metatarsals, with severe edema. Initial encounter. EXAM: RIGHT FOOT COMPLETE - 3+ VIEW COMPARISON:  None. FINDINGS: There is no evidence of fracture or dislocation. The joint spaces are preserved. There is no evidence of talar subluxation; the subtalar joint is unremarkable in appearance. An os peroneum is noted. There is question of vague lucency at the medial aspects of the fourth and fifth metatarsal heads. Mild osteomyelitis cannot be excluded. Diffuse soft tissue edema is noted about the forefoot and dorsum of the midfoot. Scattered soft tissue air is noted tracking about the lateral aspect of the forefoot. Would correlate clinically to exclude necrotizing fasciitis. IMPRESSION: 1. No evidence of fracture or dislocation. 2. Os peroneum noted. 3. Question of vague lucency at the medial aspects of the fourth and fifth metatarsal heads. Mild osteomyelitis cannot be excluded. 4. Scattered soft tissue air tracking about the lateral aspect of the forefoot. Would correlate clinically to exclude necrotizing fasciitis These results were called by telephone at the time of interpretation on 02/27/2017 at 7:22 pm to Dr. Shirlyn Goltz, who verbally acknowledged these results. Electronically Signed   By: Garald Balding M.D.   On: 02/27/2017 19:24   [place] Ue Vein Mapping Right  Addendum Date: 03/09/2017   ADDENDUM REPORT: 03/09/2017 15:36 ADDENDUM: Additional description of the branches greater than 2 mm: Total of 12 branches. There are 3 branches involving the upper arm cephalic vein. There are 2 branches involving the forearm basilic vein. 5 branches involving the basilic vein above the elbow. 2 branches involving the brachial vein in the upper arm. Electronically Signed   By: Markus Daft M.D.   On: 03/09/2017 15:36   Result Date: 03/09/2017 CLINICAL DATA:  Preoperative vascular survey prior to arteriovenous fistula creation. EXAM:  [place] EXTREM UP VEIN MAPPING COMPARISON:  None. FINDINGS: RIGHT ARTERIES Wrist Radial Artery: Size 2.57mm  Waveform Triphasic Wrist Ulnar Artery: Size 2.33mm  Waveform Triphasic Prox. Forearm Radial Artery: Size 2.56mm  Waveform Triphasic Upper Arm Brachial Artery: Size 5.22mm  Waveform Triphasic RIGHT VEINS Forearm Cephalic Vein: Prox 0.6YI Distal 2.54mm Depth 9.4WN Forearm Basilic Vein: Prox 4.6EV Distal 3.41mm Depth 0.3JK Upper Arm Cephalic Vein: Prox 0.9FG Distal 4.44mm Depth 18.2XH Upper Arm Basilic Vein: Prox 3.7JI Distal 5.82mm Depth 18.20mm Upper Arm Brachial Vein: Prox 5.108mm Distal 4.39mm Depth 13.55mm ADDITIONAL RIGHT VEINS Axillary Vein:  5.60mm Subclavian Vein: Patent:  Yes Respiratory Phasicity: Present Internal Jugular Vein: Patent: Yes    Respiratory Phasicity: Present Branches > 2 mm: 12 Left internal jugular vein is patent with normal phasicity. Left subclavian vein is patent with normal phasicity. There is a peripheral IV in the right cephalic vein antecubital fossa. Proximal to the IV, there is a small amount of nonocclusive thrombus. There is wall thickening involving the right basilic vein in the mid forearm. IMPRESSION: Right upper extremity vein measurements as described. Small amount of nonocclusive superficial thrombus involving the right cephalic vein. Thrombus is likely related to the patient's peripheral IV. Wall thickening involving the right basilic vein in the mid forearm. Electronically Signed: By: Markus Daft M.D. On: 03/08/2017 16:13    Assessment/Plan 1.  Complication dialysis device with thrombosis AV access:  Patient's tunneled catheter is not working ie dialysis access is malfunctioning. The patient will undergo exchange of the catheter same access.  The risks and benefits were described to the patient.  All questions were answered.  The patient agrees to proceed with angiography and intervention. Potassium will be drawn to ensure that it is an appropriate level prior to performing  intervention. 2.  End-stage renal disease requiring hemodialysis:  Patient will continue dialysis therapy without further interruption if a successful exchange is not achieved then a new site for a tunneled catheter will be placed. Dialysis has already been arranged. 3.  Hypertension:  Patient will continue medical management; nephrology is following no changes in oral medications. 4. Diabetes mellitus:  Glucose will be monitored and oral medications been held this morning once the patient has undergone the patient's procedure po intake will be reinitiated and again Accu-Cheks will be used to assess the blood glucose level and treat as needed. The patient will be restarted on the patient's usual hypoglycemic regime 5.  Hypercholesterolemia:  Continue statin therapy    Hortencia Pilar, MD  03/29/2017 1:07 PM

## 2017-03-30 ENCOUNTER — Emergency Department (HOSPITAL_COMMUNITY)
Admission: EM | Admit: 2017-03-30 | Discharge: 2017-03-30 | Disposition: A | Discharge status: Home / Self Care | Payer: Medicaid Other | Attending: Emergency Medicine | Admitting: Emergency Medicine

## 2017-03-30 ENCOUNTER — Encounter (HOSPITAL_COMMUNITY): Payer: Self-pay | Admitting: Emergency Medicine

## 2017-03-30 DIAGNOSIS — Z992 Dependence on renal dialysis: Secondary | ICD-10-CM | POA: Insufficient documentation

## 2017-03-30 DIAGNOSIS — E1122 Type 2 diabetes mellitus with diabetic chronic kidney disease: Secondary | ICD-10-CM | POA: Insufficient documentation

## 2017-03-30 DIAGNOSIS — R197 Diarrhea, unspecified: Secondary | ICD-10-CM | POA: Diagnosis not present

## 2017-03-30 DIAGNOSIS — I129 Hypertensive chronic kidney disease with stage 1 through stage 4 chronic kidney disease, or unspecified chronic kidney disease: Secondary | ICD-10-CM | POA: Insufficient documentation

## 2017-03-30 DIAGNOSIS — R42 Dizziness and giddiness: Secondary | ICD-10-CM | POA: Insufficient documentation

## 2017-03-30 DIAGNOSIS — Z7982 Long term (current) use of aspirin: Secondary | ICD-10-CM | POA: Diagnosis not present

## 2017-03-30 DIAGNOSIS — F17290 Nicotine dependence, other tobacco product, uncomplicated: Secondary | ICD-10-CM | POA: Insufficient documentation

## 2017-03-30 DIAGNOSIS — N189 Chronic kidney disease, unspecified: Secondary | ICD-10-CM | POA: Diagnosis not present

## 2017-03-30 DIAGNOSIS — Z794 Long term (current) use of insulin: Secondary | ICD-10-CM | POA: Insufficient documentation

## 2017-03-30 LAB — I-STAT CHEM 8, ED
BUN: 46 mg/dL — AB (ref 6–20)
CALCIUM ION: 1.08 mmol/L — AB (ref 1.15–1.40)
CHLORIDE: 105 mmol/L (ref 101–111)
Creatinine, Ser: 6.9 mg/dL — ABNORMAL HIGH (ref 0.61–1.24)
Glucose, Bld: 66 mg/dL (ref 65–99)
HCT: 35 % — ABNORMAL LOW (ref 39.0–52.0)
Hemoglobin: 11.9 g/dL — ABNORMAL LOW (ref 13.0–17.0)
POTASSIUM: 4.6 mmol/L (ref 3.5–5.1)
SODIUM: 138 mmol/L (ref 135–145)
TCO2: 24 mmol/L (ref 0–100)

## 2017-03-30 NOTE — ED Notes (Signed)
EDP at bedside  

## 2017-03-30 NOTE — ED Notes (Signed)
Pt eating nabs and drinking sprite zero.

## 2017-03-30 NOTE — ED Notes (Signed)
Glucose 66. Pt given crackers with peanut butter by charge RN. EDP aware.

## 2017-03-30 NOTE — ED Provider Notes (Signed)
[facility] DEPT Provider Note   CSN: [id number] Arrival date & time: 03/30/17  1205     History   Chief Complaint Chief Complaint  Patient presents with  . Hypotension    HPI Steven [name] is a 56 y.o. male. Patient became Lightheaded this morning while sitting in a restaurant prior to dialysis he was brought by EMS. Blood pressure was determined to be in the 70s. He was treated by EMS with saline 100 mL blood pressure normalized and he is presently asymptomatic. She reports she's had diarrhea for approximately the past week, which is slowing. He also ate less yesterday due to minor surgery i.e. his dialysis catheter was repositioned. He denies pain anywhere. He presently feels well.  HPI  Past Medical History:  Diagnosis Date  . Cellulitis and abscess of right lower extremity 03/01/2017  . Chronic kidney disease   . Diabetes mellitus without complication ([place])   . Edema extremities 03/01/2017   bilateral swelling  . High cholesterol   . High triglycerides   . Hypertension   . Sleep apnea    can't use cpap d/t feelings of suffocation    Patient Active Problem List   Diagnosis Date Noted  . Cellulitis in diabetic foot ([place]) 02/27/2017  . Acute on chronic renal failure ([place]) 02/27/2017  . Cellulitis 02/27/2017  . DIABETIC  RETINOPATHY 03/03/2009  . SLEEP APNEA 01/24/2009  . EDEMA 12/16/2008  . DIABETES MELLITUS, TYPE II 02/19/2008  . HYPERLIPIDEMIA 02/19/2008  . Morbid obesity ([place]) 02/19/2008  . DEPRESSION 02/19/2008  . HYPERTENSION 06/17/2007  . GERD 06/17/2007    Past Surgical History:  Procedure Laterality Date  . APPENDECTOMY  2010  . DIALYSIS/PERMA CATHETER INSERTION N/A 03/05/2017   Procedure: Dialysis/Perma Catheter Insertion;  Surgeon: Katha Cabal, MD;  Location: [facility];  Service: Cardiovascular;  Laterality: N/A;  . EXCHANGE OF A DIALYSIS CATHETER  03/29/2017   Procedure: Exchange Of A Dialysis Catheter;  Surgeon: Katha Cabal, MD;  Location: [facility];  Service: Cardiovascular;;  . IRRIGATION AND DEBRIDEMENT FOOT Right 03/01/2017   Procedure: IRRIGATION AND DEBRIDEMENT FOOT;  Surgeon: Albertine Patricia, DPM;  Location: [facility];  Service: Podiatry;  Laterality: Right;  application of wound vac       Home Medications    Prior to Admission medications   Medication Sig Start Date End Date Taking? Authorizing Provider  amLODipine (NORVASC) 5 MG tablet Take 1 tablet (5 mg total) by mouth daily. TAKE ONE TABLET BY MOUTH ONCE DAILY --  **NEEDS  VISIT  WITH  PRIMARY  CARE  PROVIDER  BEFORE  REFILLS  GIVEN** 03/07/17   Vaughan Basta, MD  aspirin EC 81 MG tablet TAKE ONE  BY MOUTH EVERY DAY 09/15/12   Alveda Reasons, MD  furosemide (LASIX) 80 MG tablet Take 1 tablet (80 mg total) by mouth daily. Patient taking differently: Take 80 mg by mouth 2 (two) times daily.  03/08/17   Vaughan Basta, MD  gabapentin (NEURONTIN) 300 MG capsule Take 1 capsule (300 mg total) by mouth 2 (two) times daily. 03/07/17   Vaughan Basta, MD  glucose blood (ACCU-CHEK AVIVA) test strip Use as instructed.use to check blood sugar up to 3 times a day  Disp: 1 box 08/20/14   [place]veda Reasons, MD  insulin aspart (NOVOLOG) 100 UNIT/ML injection Inject 4 Units into the skin 3 (three) times daily with meals. Patient taking differently: Inject 30 Units into the skin 3 (three) times daily  with meals.  03/07/17   Vaughan Basta, MD  insulin detemir (LEVEMIR) 100 UNIT/ML injection Inject 0.34 mLs (34 Units total) into the skin daily. Patient taking differently: Inject 70 Units into the skin daily.  03/07/17   Vaughan Basta, MD  Insulin Pen Needle 31G X 8 MM MISC 1 Container by Does not apply route QID. qid--any brand 08/20/14   [place]a Reasons, MD  Insulin Syringes, Disposable, U-100 1 ML MISC Use as directed with insulin 08/20/14   Alveda Reasons, MD  Lancets (ACCU-CHEK MULTICLIX) lancets Use as  instructed.  check blood sugar up to 3 times a day Disp:  1 box 08/20/14   [place]a Reasons, MD  losartan (COZAAR) 50 MG tablet Take 1 tablet (50 mg total) by mouth daily. 03/07/17 03/07/18  Vaughan Basta, MD  metoprolol tartrate (LOPRESSOR) 25 MG tablet Take 1 tablet (25 mg total) by mouth 2 (two) times daily. 03/07/17   Vaughan Basta, MD  Multiple Vitamin (MULTIVITAMIN WITH MINERALS) TABS tablet Take 1 tablet by mouth daily.    [provider]  oxyCODONE-acetaminophen (PERCOCET/ROXICET) 5-325 MG tablet Take 2 tablets by mouth every 6 (six) hours as needed for moderate pain or severe pain. Patient not taking: Reported on 03/28/2017 03/07/17   Vaughan Basta, MD  oxymetazoline (AFRIN) 0.05 % nasal spray Place 1 spray into both nostrils daily as needed for congestion.    [provider]  ranitidine (ZANTAC) 150 MG tablet Take 150 mg by mouth 2 (two) times daily as needed for heartburn (take 1 tablet every morning and 2nd tablet only if needed).     [provider]    Family History Family History  Problem Relation Age of Onset  . CAD Father     Social History Social History  Substance Use Topics  . Smoking status: Never Smoker  . Smokeless tobacco: Current User    Types: Snuff  . Alcohol use Yes     Comment: up until 01/2017 was heavy drinker...4 40 oz qd     Allergies   Codeine   Review of Systems Review of Systems  Constitutional: Negative.   HENT: Negative.   Respiratory: Negative.   Cardiovascular: Negative.   Gastrointestinal: Positive for diarrhea.  Musculoskeletal: Negative.   Skin: Positive for wound.       Chronic wound of right foot  Allergic/Immunologic: Positive for immunocompromised state.       Dialysis patient  Neurological: Positive for light-headedness.  Psychiatric/Behavioral: Negative.   All other systems reviewed and are negative.    Physical Exam Updated Vital Signs BP 135/67 (BP Location: Left Arm)    Pulse 67   Temp 97.5 F (36.4 C) (Oral)   Resp 20   Ht 5\' 10"  (1.778 m)   Wt (!) 148.8 kg (328 lb)   SpO2 100%   BMI 47.06 kg/m   Physical Exam  Constitutional: He appears well-developed and well-nourished. No distress.  HENT:  Head: Normocephalic and atraumatic.  Eyes: Conjunctivae are normal. Pupils are equal, round, and reactive to light.  Neck: Neck supple. No tracheal deviation present. No thyromegaly present.  Cardiovascular: Normal rate and regular rhythm.   No murmur heard. Pulmonary/Chest: Effort normal and breath sounds normal.  Dialysis catheter right anterior chest  Abdominal: Soft. Bowel sounds are normal. He exhibits no distension. There is no tenderness.  Morbidly obese  Musculoskeletal: Normal range of motion. He exhibits no tenderness.  Lower extremity there is a wound VAC overlying dorsum of right foot. No  surrounding redness or no tenderness over wound VAC. Good capillary refill. All other extremities without redness or tenderness. Neurovascular intact  Neurological: He is alert. Coordination normal.  Skin: Skin is warm and dry. No rash noted.  Psychiatric: He has a normal mood and affect.  Nursing note and vitals reviewed.    ED Treatments / Results  Labs (all labs ordered are listed, but only abnormal results are displayed) Labs Reviewed  I-STAT CHEM 8, ED    EKG  EKG Interpretation None       Radiology Dg Chest 1 View  Result Date: 03/29/2017 CLINICAL DATA:  Replacement of central line. EXAM: CHEST 1 VIEW COMPARISON:  02/27/2017 FINDINGS: Double lumen central catheter in place in good position. No pneumothorax. Lungs are clear. Heart size and vascularity are normal. IMPRESSION: Central line in good position in the superior vena cava. Electronically Signed   By: Lorriane Shire M.D.   On: 03/29/2017 14:46    Procedures Procedures (including critical care time)  Medications Ordered in ED Medications - No data to display  Results for  orders placed or performed during the hospital encounter of 03/30/17  I-stat chem 8, ed  Result Value Ref Range   Sodium 138 135 - 145 mmol/L   Potassium 4.6 3.5 - 5.1 mmol/L   Chloride 105 101 - 111 mmol/L   BUN 46 (H) 6 - 20 mg/dL   Creatinine, Ser 6.90 (H) 0.61 - 1.24 mg/dL   Glucose, Bld 66 65 - 99 mg/dL   Calcium, Ion 1.08 (L) 1.15 - 1.40 mmol/L   TCO2 24 0 - 100 mmol/L   Hemoglobin 11.9 (L) 13.0 - 17.0 g/dL   HCT 35.0 (L) 39.0 - 52.0 %   Dg Chest 1 View  Result Date: 03/29/2017 CLINICAL DATA:  Replacement of central line. EXAM: CHEST 1 VIEW COMPARISON:  02/27/2017 FINDINGS: Double lumen central catheter in place in good position. No pneumothorax. Lungs are clear. Heart size and vascularity are normal. IMPRESSION: Central line in good position in the superior vena cava. Electronically Signed   By: Lorriane Shire M.D.   On: 03/29/2017 14:46   [place] U[place] Vein Mapping Right  Addendum Date: 03/09/2017   ADDENDUM REPORT: 03/09/2017 15:36 ADDENDUM: Additional description of the branches greater than 2 mm: Total of 12 branches. There are 3 branches involving the upper arm cephalic vein. There are 2 branches involving the forearm basilic vein. 5 branches involving the basilic vein above the elbow. 2 branches involving the brachial vein in the upper arm. Electronically Signed   By: Markus Daft M.D.   On: 03/09/2017 15:36   Result Date: 03/09/2017 CLINICAL DATA:  Preoperative vascular survey prior to arteriovenous fistula creation. EXAM: [place] EXTREM UP VEIN MAPPING COMPARISON:  None. FINDINGS: RIGHT ARTERIES Wrist Radial Artery: Size 2.74mm  Waveform Triphasic Wrist Ulnar Artery: Size 2.28mm  Waveform Triphasic Prox. Forearm Radial Artery: Size 2.34mm  Waveform Triphasic Upper Arm Brachial Artery: Size 5.80mm  Waveform Triphasic RIGHT VEINS Forearm Cephalic Vein: Prox 8.7OM Distal 2.39mm Depth 7.6HM Forearm Basilic Vein: Prox 0.9OB Distal 3.49mm Depth 0.9GG Upper Arm Cephalic Vein: Prox 8.3MO Distal 4.66mm Depth  29.4TM Upper Arm Basilic Vein: Prox 5.4YT Distal 5.60mm Depth 18.58mm Upper Arm Brachial Vein: Prox 5.65mm Distal 4.66mm Depth 13.95mm ADDITIONAL RIGHT VEINS Axillary Vein:  5.63mm Subclavian Vein: Patent: Yes Respiratory Phasicity: Present Internal Jugular Vein: Patent: Yes    Respiratory Phasicity: Present Branches > 2 mm: 12 Left internal jugular vein is patent with normal phasicity. Left  subclavian vein is patent with normal phasicity. There is a peripheral IV in the right cephalic vein antecubital fossa. Proximal to the IV, there is a small amount of nonocclusive thrombus. There is wall thickening involving the right basilic vein in the mid forearm. IMPRESSION: Right upper extremity vein measurements as described. Small amount of nonocclusive superficial thrombus involving the right cephalic vein. Thrombus is likely related to the patient's peripheral IV. Wall thickening involving the right basilic vein in the mid forearm. Electronically Signed: By: Markus Daft M.D. On: 03/08/2017 16:13   Initial Impression / Assessment and Plan / ED Course  I have reviewed the triage vital signs and the nursing notes.  Pertinent labs & imaging results that were available during my care of the patient were reviewed by me and considered in my medical decision making (see chart for details).     1:05 PM patient is alert and in no distress. Not lightheaded on standing. He feels ready for discharge. He is asymptomatic. He'll be given a smalls snack prior to discharge. He'll go from here directly to hemodialysis. Transient hypotension likely secondary to mild dehydration. Patient has had diarrhea and ate less yesterday. He had been on Augmentin until a week ago. States diarrhea is slowing down. No further ED evaluation needed.   Final Clinical Impressions(s) / ED Diagnoses   Final diagnoses:  None  Diagnosis lightheadedness  New Prescriptions New Prescriptions   No medications on file     Orlie Dakin,  MD 03/30/17 1316

## 2017-03-30 NOTE — ED Triage Notes (Addendum)
At dialysis today, pt became clammy, bp was 70's sys upon ems arrival.  Pt reports diarrhea for past 3-4days.  bg 115 and pt bp was 100's sys prior to arrival to ed.

## 2017-03-30 NOTE — Discharge Instructions (Signed)
Go to dialysis immediately upon leaving here. Return if concern for any reason or follow-up with her primary care physician

## 2017-04-04 ENCOUNTER — Ambulatory Visit (INDEPENDENT_AMBULATORY_CARE_PROVIDER_SITE_OTHER): Payer: Medicaid Other | Admitting: Vascular Surgery

## 2017-04-04 ENCOUNTER — Encounter (INDEPENDENT_AMBULATORY_CARE_PROVIDER_SITE_OTHER): Payer: Self-pay | Admitting: Vascular Surgery

## 2017-04-04 VITALS — BP 139/78 | HR 82 | Resp 17 | Wt 314.0 lb

## 2017-04-04 DIAGNOSIS — T8090XS Unspecified complication following infusion and therapeutic injection, sequela: Secondary | ICD-10-CM | POA: Diagnosis not present

## 2017-04-04 DIAGNOSIS — N186 End stage renal disease: Secondary | ICD-10-CM | POA: Diagnosis not present

## 2017-04-04 DIAGNOSIS — K219 Gastro-esophageal reflux disease without esophagitis: Secondary | ICD-10-CM | POA: Diagnosis not present

## 2017-04-07 DIAGNOSIS — T8090XA Unspecified complication following infusion and therapeutic injection, initial encounter: Secondary | ICD-10-CM | POA: Insufficient documentation

## 2017-04-07 DIAGNOSIS — N186 End stage renal disease: Secondary | ICD-10-CM | POA: Insufficient documentation

## 2017-04-07 NOTE — Progress Notes (Signed)
MRN : [id number]  Steven [name] is a 56 y.o. (23-Nov-1960) male who presents with chief complaint of  Chief Complaint  Patient presents with  . Routine Post Op  .  History of Present Illness:  The patient is seen for evaluation of dialysis access.  The patient has a history of multiple failed accesses.  There have been accesses in both arms and in the thighs.    Current access is via a catheter which is functioning poorly.  There have been several episodes of catheter infection.  The patient denies amaurosis fugax or recent TIA symptoms. There are no recent neurological changes noted. The patient denies claudication symptoms or rest pain symptoms. The patient denies history of DVT, PE or superficial thrombophlebitis. The patient denies recent episodes of angina or shortness of breath.   Current Meds  Medication Sig  . amLODipine (NORVASC) 5 MG tablet Take 1 tablet (5 mg total) by mouth daily. TAKE ONE TABLET BY MOUTH ONCE DAILY --  **NEEDS  VISIT  WITH  PRIMARY  CARE  PROVIDER  BEFORE  REFILLS  GIVEN**  . aspirin EC 81 MG tablet TAKE ONE  BY MOUTH EVERY DAY  . furosemide (LASIX) 80 MG tablet Take 1 tablet (80 mg total) by mouth daily. (Patient taking differently: Take 80 mg by mouth 2 (two) times daily. )  . gabapentin (NEURONTIN) 300 MG capsule Take 1 capsule (300 mg total) by mouth 2 (two) times daily.  Marland Kitchen glucose blood (ACCU-CHEK AVIVA) test strip Use as instructed.use to check blood sugar up to 3 times a day  Disp: 1 box  . insulin aspart (NOVOLOG) 100 UNIT/ML injection Inject 4 Units into the skin 3 (three) times daily with meals. (Patient taking differently: Inject 30 Units into the skin 3 (three) times daily with meals. )  . insulin detemir (LEVEMIR) 100 UNIT/ML injection Inject 0.34 mLs (34 Units total) into the skin daily. (Patient taking differently: Inject 70 Units into the skin daily. )  . Insulin Pen Needle 31G X 8 MM MISC 1 Container by Does not apply route QID. qid--any  brand  . Insulin Syringes, Disposable, U-100 1 ML MISC Use as directed with insulin  . Lancets (ACCU-CHEK MULTICLIX) lancets Use as instructed.  check blood sugar up to 3 times a day Disp:  1 box  . losartan (COZAAR) 50 MG tablet Take 1 tablet (50 mg total) by mouth daily.  . metoprolol tartrate (LOPRESSOR) 25 MG tablet Take 1 tablet (25 mg total) by mouth 2 (two) times daily.  . Multiple Vitamin (MULTIVITAMIN WITH MINERALS) TABS tablet Take 1 tablet by mouth daily.  Marland Kitchen oxyCODONE-acetaminophen (PERCOCET/ROXICET) 5-325 MG tablet Take 2 tablets by mouth every 6 (six) hours as needed for moderate pain or severe pain.  Marland Kitchen oxymetazoline (AFRIN) 0.05 % nasal spray Place 1 spray into both nostrils daily as needed for congestion.  . ranitidine (ZANTAC) 150 MG tablet Take 150 mg by mouth 2 (two) times daily as needed for heartburn (take 1 tablet every morning and 2nd tablet only if needed).     Past Medical History:  Diagnosis Date  . Cellulitis and abscess of right lower extremity 03/01/2017  . Chronic kidney disease   . Diabetes mellitus without complication ([place])   . Edema extremities 03/01/2017   bilateral swelling  . High cholesterol   . High triglycerides   . Hypertension   . Sleep apnea    can't use cpap d/t feelings of suffocation    Past  Surgical History:  Procedure Laterality Date  . APPENDECTOMY  2010  . DIALYSIS/PERMA CATHETER INSERTION N/A 03/05/2017   Procedure: Dialysis/Perma Catheter Insertion;  Surgeon: Katha Cabal, MD;  Location: [facility];  Service: Cardiovascular;  Laterality: N/A;  . EXCHANGE OF A DIALYSIS CATHETER  03/29/2017   Procedure: Exchange Of A Dialysis Catheter;  Surgeon: Katha Cabal, MD;  Location: [facility];  Service: Cardiovascular;;  . IRRIGATION AND DEBRIDEMENT FOOT Right 03/01/2017   Procedure: IRRIGATION AND DEBRIDEMENT FOOT;  Surgeon: Albertine Patricia, DPM;  Location: [facility];  Service: Podiatry;  Laterality: Right;   application of wound vac    Social History Social History  Substance Use Topics  . Smoking status: Never Smoker  . Smokeless tobacco: Current User    Types: Snuff  . Alcohol use Yes     Comment: up until 01/2017 was heavy drinker...4 40 oz qd    Family History Family History  Problem Relation Age of Onset  . CAD Father     Allergies  Allergen Reactions  . Codeine Nausea And Vomiting     REVIEW OF SYSTEMS (Negative unless checked)  Constitutional: [] Weight loss  [] Fever  [] Chills Cardiac: [] Chest pain   [] Chest pressure   [] Palpitations   [] Shortness of breath when laying flat   [] Shortness of breath with exertion. Vascular:  [] Pain in legs with walking   [] Pain in legs at rest  [] History of DVT   [] Phlebitis   [] Swelling in legs   [] Varicose veins   [] Non-healing ulcers Pulmonary:   [] Uses home oxygen   [] Productive cough   [] Hemoptysis   [] Wheeze  [] COPD   [] Asthma Neurologic:  [] Dizziness   [] Seizures   [] History of stroke   [] History of TIA  [] Aphasia   [] Vissual changes   [] Weakness or numbness in arm   [] Weakness or numbness in leg Musculoskeletal:   [] Joint swelling   [] Joint pain   [] Low back pain Hematologic:  [] Easy bruising  [] Easy bleeding   [] Hypercoagulable state   [] Anemic Gastrointestinal:  [] Diarrhea   [] Vomiting  [] Gastroesophageal reflux/heartburn   [] Difficulty swallowing. Genitourinary:  [x] Chronic kidney disease   [] Difficult urination  [] Frequent urination   [] Blood in urine Skin:  [] Rashes   [] Ulcers  Psychological:  [] History of anxiety   []  History of major depression.  Physical Examination  Vitals:   04/04/17 1444  BP: 139/78  Pulse: 82  Resp: 17  Weight: (!) 314 lb (142.4 kg)   Body mass index is 45.05 kg/m. Gen: WD/WN, NAD Head: [place]/AT, No temporalis wasting.  Ear/Nose/Throat: Hearing grossly intact, nares w/o erythema or drainage Eyes: PER, EOMI, sclera nonicteric.  Neck: Supple, no large masses.   Pulmonary:  Good air movement, no  audible wheezing bilaterally, no use of accessory muscles.  Cardiac: RRR, no JVD Vascular: right arm antecubital fossa has a area of uceration Vessel Right Left  Radial Palpable Palpable  Ulnar Palpable Palpable  Brachial Palpable Palpable  Carotid Palpable Palpable  Gastrointestinal: Non-distended. No guarding/no peritoneal signs.  Musculoskeletal: M/S 5/5 throughout.  No deformity or atrophy.  Neurologic: CN 2-12 intact. Symmetrical.  Speech is fluent. Motor exam as listed above. Psychiatric: Judgment intact, Mood & affect appropriate for pt's clinical situation. Dermatologic: No rashes or ulcers noted.  No changes consistent with cellulitis. Lymph : No lichenification or skin changes of chronic lymphedema.  CBC Lab Results  Component Value Date   WBC 8.3 03/06/2017   HGB 11.9 (L) 03/30/2017   HCT 35.0 (  L) 03/30/2017   MCV 86.2 03/06/2017   PLT 284 03/06/2017    BMET    Component Value Date/Time   NA 138 03/30/2017 1305   K 4.6 03/30/2017 1305   CL 105 03/30/2017 1305   CO2 28 03/05/2017 1400   GLUCOSE 66 03/30/2017 1305   BUN 46 (H) 03/30/2017 1305   CREATININE 6.90 (H) 03/30/2017 1305   CREATININE 1.41 (H) 12/07/2011 1030   CALCIUM 8.2 (L) 03/05/2017 1400   GFRNONAA 12 (L) 03/05/2017 1400   GFRAA 14 (L) 03/05/2017 1400   Estimated Creatinine Clearance: 17.2 mL/min (A) (by C-G formula based on SCr of 6.9 mg/dL (H)).  COAG No results found for: INR, PROTIME  Radiology Dg Chest 1 View  Result Date: 03/29/2017 CLINICAL DATA:  Replacement of central line. EXAM: CHEST 1 VIEW COMPARISON:  02/27/2017 FINDINGS: Double lumen central catheter in place in good position. No pneumothorax. Lungs are clear. Heart size and vascularity are normal. IMPRESSION: Central line in good position in the superior vena cava. Electronically Signed   By: Lorriane Shire M.D.   On: 03/29/2017 14:46    Assessment/Plan 1. End stage renal disease ([place]) Recommend:  The patient is experiencing  increasing problems with their catheter based access.  Patient should have vein mapping.  The intention for creation of a right brachial cephalic fistula.  He will follow up with me to review th ultrasound.  2. Complication of renal dialysis, sequela See #1 - VAS [place] UPPER EXTREMITY VEIN MAPPING; Future  3. Gastroesophageal reflux disease without esophagitis Continue PPI as already ordered, these medications have been reviewed and there are no changes at this time.     Hortencia Pilar, MD  04/07/2017 7:44 PM

## 2017-04-11 ENCOUNTER — Encounter (INDEPENDENT_AMBULATORY_CARE_PROVIDER_SITE_OTHER): Payer: MEDICAID

## 2017-04-18 ENCOUNTER — Encounter (INDEPENDENT_AMBULATORY_CARE_PROVIDER_SITE_OTHER): Payer: MEDICAID

## 2017-04-18 ENCOUNTER — Encounter (INDEPENDENT_AMBULATORY_CARE_PROVIDER_SITE_OTHER): Payer: Self-pay

## 2017-04-22 ENCOUNTER — Encounter (INDEPENDENT_AMBULATORY_CARE_PROVIDER_SITE_OTHER): Payer: MEDICAID

## 2017-04-22 ENCOUNTER — Ambulatory Visit (INDEPENDENT_AMBULATORY_CARE_PROVIDER_SITE_OTHER): Payer: MEDICAID | Admitting: Vascular Surgery

## 2017-04-25 DIAGNOSIS — T8249XA Other complication of vascular dialysis catheter, initial encounter: Secondary | ICD-10-CM | POA: Diagnosis not present

## 2017-04-25 DIAGNOSIS — Z992 Dependence on renal dialysis: Secondary | ICD-10-CM | POA: Diagnosis not present

## 2017-04-25 DIAGNOSIS — N186 End stage renal disease: Secondary | ICD-10-CM | POA: Diagnosis not present

## 2017-05-08 DIAGNOSIS — Z992 Dependence on renal dialysis: Secondary | ICD-10-CM | POA: Diagnosis not present

## 2017-05-08 DIAGNOSIS — Z23 Encounter for immunization: Secondary | ICD-10-CM | POA: Diagnosis not present

## 2017-05-08 DIAGNOSIS — T82898A Other specified complication of vascular prosthetic devices, implants and grafts, initial encounter: Secondary | ICD-10-CM | POA: Diagnosis not present

## 2017-05-08 DIAGNOSIS — N186 End stage renal disease: Secondary | ICD-10-CM | POA: Diagnosis not present

## 2017-05-08 DIAGNOSIS — D509 Iron deficiency anemia, unspecified: Secondary | ICD-10-CM | POA: Diagnosis not present

## 2017-05-08 DIAGNOSIS — E8770 Fluid overload, unspecified: Secondary | ICD-10-CM | POA: Diagnosis not present

## 2017-05-09 ENCOUNTER — Ambulatory Visit (INDEPENDENT_AMBULATORY_CARE_PROVIDER_SITE_OTHER): Payer: Medicare Other

## 2017-05-09 ENCOUNTER — Ambulatory Visit (INDEPENDENT_AMBULATORY_CARE_PROVIDER_SITE_OTHER): Payer: Medicare Other | Admitting: Vascular Surgery

## 2017-05-09 ENCOUNTER — Encounter (INDEPENDENT_AMBULATORY_CARE_PROVIDER_SITE_OTHER): Payer: Self-pay | Admitting: Vascular Surgery

## 2017-05-09 VITALS — BP 111/72 | HR 88 | Resp 17 | Ht 70.5 in | Wt 314.0 lb

## 2017-05-09 DIAGNOSIS — N186 End stage renal disease: Secondary | ICD-10-CM | POA: Diagnosis not present

## 2017-05-09 DIAGNOSIS — T8090XS Unspecified complication following infusion and therapeutic injection, sequela: Secondary | ICD-10-CM | POA: Diagnosis not present

## 2017-05-09 DIAGNOSIS — I10 Essential (primary) hypertension: Secondary | ICD-10-CM | POA: Diagnosis not present

## 2017-05-09 DIAGNOSIS — K219 Gastro-esophageal reflux disease without esophagitis: Secondary | ICD-10-CM

## 2017-05-09 NOTE — Progress Notes (Signed)
MRN : [id number]  Steven [name] is a 56 y.o. (07/09/1961) male who presents with chief complaint of No chief complaint on file. Marland Kitchen  History of Present Illness:  The patient is seen for evaluation of dialysis access.  The patient has a history of multiple failed accesses.  There have been accesses in both arms and in the thighs.    Current access is via a catheter which is functioning poorly.  There have been several episodes of catheter infection.  The patient denies amaurosis fugax or recent TIA symptoms. There are no recent neurological changes noted. The patient denies claudication symptoms or rest pain symptoms. The patient denies history of DVT, PE or superficial thrombophlebitis. The patient denies recent episodes of angina or shortness of breath.    No outpatient prescriptions have been marked as taking for the 05/09/17 encounter (Appointment) with Delana Meyer, Dolores Lory, MD.    Past Medical History:  Diagnosis Date  . Cellulitis and abscess of right lower extremity 03/01/2017  . Chronic kidney disease   . Diabetes mellitus without complication ([place])   . Edema extremities 03/01/2017   bilateral swelling  . High cholesterol   . High triglycerides   . Hypertension   . Sleep apnea    can't use cpap d/t feelings of suffocation    Past Surgical History:  Procedure Laterality Date  . APPENDECTOMY  2010  . DIALYSIS/PERMA CATHETER INSERTION N/A 03/05/2017   Procedure: Dialysis/Perma Catheter Insertion;  Surgeon: Katha Cabal, MD;  Location: [facility];  Service: Cardiovascular;  Laterality: N/A;  . EXCHANGE OF A DIALYSIS CATHETER  03/29/2017   Procedure: Exchange Of A Dialysis Catheter;  Surgeon: Katha Cabal, MD;  Location: [facility];  Service: Cardiovascular;;  . IRRIGATION AND DEBRIDEMENT FOOT Right 03/01/2017   Procedure: IRRIGATION AND DEBRIDEMENT FOOT;  Surgeon: Albertine Patricia, DPM;  Location: [facility];  Service: Podiatry;  Laterality:  Right;  application of wound vac    Social History Social History  Substance Use Topics  . Smoking status: Never Smoker  . Smokeless tobacco: Current User    Types: Snuff  . Alcohol use Yes     Comment: up until 01/2017 was heavy drinker...4 40 oz qd    Family History Family History  Problem Relation Age of Onset  . CAD Father     Allergies  Allergen Reactions  . Codeine Nausea And Vomiting     REVIEW OF SYSTEMS (Negative unless checked)  Constitutional: [] Weight loss  [] Fever  [] Chills Cardiac: [] Chest pain   [] Chest pressure   [] Palpitations   [] Shortness of breath when laying flat   [] Shortness of breath with exertion. Vascular:  [] Pain in legs with walking   [] Pain in legs at rest  [] History of DVT   [] Phlebitis   [] Swelling in legs   [] Varicose veins   [] Non-healing ulcers Pulmonary:   [] Uses home oxygen   [] Productive cough   [] Hemoptysis   [] Wheeze  [] COPD   [] Asthma Neurologic:  [] Dizziness   [] Seizures   [] History of stroke   [] History of TIA  [] Aphasia   [] Vissual changes   [] Weakness or numbness in arm   [] Weakness or numbness in leg Musculoskeletal:   [] Joint swelling   [] Joint pain   [] Low back pain Hematologic:  [] Easy bruising  [] Easy bleeding   [] Hypercoagulable state   [] Anemic Gastrointestinal:  [] Diarrhea   [] Vomiting  [] Gastroesophageal reflux/heartburn   [] Difficulty swallowing. Genitourinary:  [x] Chronic kidney disease   [] Difficult urination  []   Frequent urination   [] Blood in urine Skin:  [] Rashes   [] Ulcers  Psychological:  [] History of anxiety   []  History of major depression.  Physical Examination  There were no vitals filed for this visit. There is no height or weight on file to calculate BMI. Gen: WD/WN, NAD Head: [place]/AT, No temporalis wasting.  Ear/Nose/Throat: Hearing grossly intact, nares w/o erythema or drainage Eyes: PER, EOMI, sclera nonicteric.  Neck: Supple, no large masses.   Pulmonary:  Good air movement, no audible wheezing  bilaterally, no use of accessory muscles.  Cardiac: RRR, no JVD Vascular:  Vessel Right Left  Radial Palpable Palpable  Ulnar Palpable Palpable  Brachial Palpable Palpable  Carotid Palpable Palpable  Gastrointestinal: Non-distended. No guarding/no peritoneal signs.  Musculoskeletal: M/S 5/5 throughout.  No deformity or atrophy.  Neurologic: CN 2-12 intact. Symmetrical.  Speech is fluent. Motor exam as listed above. Psychiatric: Judgment intact, Mood & affect appropriate for pt's clinical situation. Dermatologic: No rashes or ulcers noted.  No changes consistent with cellulitis. Lymph : No lichenification or skin changes of chronic lymphedema.  CBC Lab Results  Component Value Date   WBC 8.3 03/06/2017   HGB 11.9 (L) 03/30/2017   HCT 35.0 (L) 03/30/2017   MCV 86.2 03/06/2017   PLT 284 03/06/2017    BMET    Component Value Date/Time   NA 138 03/30/2017 1305   K 4.6 03/30/2017 1305   CL 105 03/30/2017 1305   CO2 28 03/05/2017 1400   GLUCOSE 66 03/30/2017 1305   BUN 46 (H) 03/30/2017 1305   CREATININE 6.90 (H) 03/30/2017 1305   CREATININE 1.41 (H) 12/07/2011 1030   CALCIUM 8.2 (L) 03/05/2017 1400   GFRNONAA 12 (L) 03/05/2017 1400   GFRAA 14 (L) 03/05/2017 1400   CrCl cannot be calculated (Patient's most recent lab result is older than the maximum 21 days allowed.).  COAG No results found for: INR, PROTIME  Radiology No results found.  Assessment/Plan 1. End stage renal disease ([place]) Recommend:  At this time the patient does not have appropriate extremity access for dialysis  Patient should have a right radiocephalic fistula created.  The risks, benefits and alternative therapies were reviewed in detail with the patient.  All questions were answered.  The patient agrees to proceed with surgery.    2. Complication of renal dialysis, sequela Continue with current dialysis plan  3. Gastroesophageal reflux disease without esophagitis Continue PPI as already  ordered, these medications have been reviewed and there are no changes at this time.   4. Essential hypertension Continue antihypertensive medications as already ordered, these medications have been reviewed and there are no changes at this time.     Hortencia Pilar, MD  05/09/2017 9:38 AM

## 2017-05-10 DIAGNOSIS — E8770 Fluid overload, unspecified: Secondary | ICD-10-CM | POA: Diagnosis not present

## 2017-05-10 DIAGNOSIS — T82898A Other specified complication of vascular prosthetic devices, implants and grafts, initial encounter: Secondary | ICD-10-CM | POA: Diagnosis not present

## 2017-05-10 DIAGNOSIS — N186 End stage renal disease: Secondary | ICD-10-CM | POA: Diagnosis not present

## 2017-05-10 DIAGNOSIS — D509 Iron deficiency anemia, unspecified: Secondary | ICD-10-CM | POA: Diagnosis not present

## 2017-05-10 DIAGNOSIS — Z992 Dependence on renal dialysis: Secondary | ICD-10-CM | POA: Diagnosis not present

## 2017-05-10 DIAGNOSIS — Z23 Encounter for immunization: Secondary | ICD-10-CM | POA: Diagnosis not present

## 2017-05-13 DIAGNOSIS — Z992 Dependence on renal dialysis: Secondary | ICD-10-CM | POA: Diagnosis not present

## 2017-05-13 DIAGNOSIS — D509 Iron deficiency anemia, unspecified: Secondary | ICD-10-CM | POA: Diagnosis not present

## 2017-05-13 DIAGNOSIS — E8770 Fluid overload, unspecified: Secondary | ICD-10-CM | POA: Diagnosis not present

## 2017-05-13 DIAGNOSIS — Z23 Encounter for immunization: Secondary | ICD-10-CM | POA: Diagnosis not present

## 2017-05-13 DIAGNOSIS — N186 End stage renal disease: Secondary | ICD-10-CM | POA: Diagnosis not present

## 2017-05-13 DIAGNOSIS — T82898A Other specified complication of vascular prosthetic devices, implants and grafts, initial encounter: Secondary | ICD-10-CM | POA: Diagnosis not present

## 2017-05-15 ENCOUNTER — Other Ambulatory Visit (INDEPENDENT_AMBULATORY_CARE_PROVIDER_SITE_OTHER): Payer: Self-pay

## 2017-05-15 DIAGNOSIS — N186 End stage renal disease: Secondary | ICD-10-CM | POA: Diagnosis not present

## 2017-05-15 DIAGNOSIS — Z23 Encounter for immunization: Secondary | ICD-10-CM | POA: Diagnosis not present

## 2017-05-15 DIAGNOSIS — E8770 Fluid overload, unspecified: Secondary | ICD-10-CM | POA: Diagnosis not present

## 2017-05-15 DIAGNOSIS — Z992 Dependence on renal dialysis: Secondary | ICD-10-CM | POA: Diagnosis not present

## 2017-05-15 DIAGNOSIS — T82898A Other specified complication of vascular prosthetic devices, implants and grafts, initial encounter: Secondary | ICD-10-CM | POA: Diagnosis not present

## 2017-05-15 DIAGNOSIS — D509 Iron deficiency anemia, unspecified: Secondary | ICD-10-CM | POA: Diagnosis not present

## 2017-05-16 DIAGNOSIS — E8770 Fluid overload, unspecified: Secondary | ICD-10-CM | POA: Diagnosis not present

## 2017-05-16 DIAGNOSIS — Z992 Dependence on renal dialysis: Secondary | ICD-10-CM | POA: Diagnosis not present

## 2017-05-16 DIAGNOSIS — N186 End stage renal disease: Secondary | ICD-10-CM | POA: Diagnosis not present

## 2017-05-16 DIAGNOSIS — D509 Iron deficiency anemia, unspecified: Secondary | ICD-10-CM | POA: Diagnosis not present

## 2017-05-16 DIAGNOSIS — Z23 Encounter for immunization: Secondary | ICD-10-CM | POA: Diagnosis not present

## 2017-05-16 DIAGNOSIS — T82898A Other specified complication of vascular prosthetic devices, implants and grafts, initial encounter: Secondary | ICD-10-CM | POA: Diagnosis not present

## 2017-05-17 DIAGNOSIS — T82898A Other specified complication of vascular prosthetic devices, implants and grafts, initial encounter: Secondary | ICD-10-CM | POA: Diagnosis not present

## 2017-05-17 DIAGNOSIS — D509 Iron deficiency anemia, unspecified: Secondary | ICD-10-CM | POA: Diagnosis not present

## 2017-05-17 DIAGNOSIS — E8770 Fluid overload, unspecified: Secondary | ICD-10-CM | POA: Diagnosis not present

## 2017-05-17 DIAGNOSIS — N186 End stage renal disease: Secondary | ICD-10-CM | POA: Diagnosis not present

## 2017-05-17 DIAGNOSIS — Z23 Encounter for immunization: Secondary | ICD-10-CM | POA: Diagnosis not present

## 2017-05-17 DIAGNOSIS — Z992 Dependence on renal dialysis: Secondary | ICD-10-CM | POA: Diagnosis not present

## 2017-05-20 ENCOUNTER — Other Ambulatory Visit (INDEPENDENT_AMBULATORY_CARE_PROVIDER_SITE_OTHER): Payer: Self-pay

## 2017-05-20 DIAGNOSIS — D509 Iron deficiency anemia, unspecified: Secondary | ICD-10-CM | POA: Diagnosis not present

## 2017-05-20 DIAGNOSIS — E8770 Fluid overload, unspecified: Secondary | ICD-10-CM | POA: Diagnosis not present

## 2017-05-20 DIAGNOSIS — T82898A Other specified complication of vascular prosthetic devices, implants and grafts, initial encounter: Secondary | ICD-10-CM | POA: Diagnosis not present

## 2017-05-20 DIAGNOSIS — Z23 Encounter for immunization: Secondary | ICD-10-CM | POA: Diagnosis not present

## 2017-05-20 DIAGNOSIS — N186 End stage renal disease: Secondary | ICD-10-CM | POA: Diagnosis not present

## 2017-05-20 DIAGNOSIS — Z992 Dependence on renal dialysis: Secondary | ICD-10-CM | POA: Diagnosis not present

## 2017-05-22 DIAGNOSIS — D509 Iron deficiency anemia, unspecified: Secondary | ICD-10-CM | POA: Diagnosis not present

## 2017-05-22 DIAGNOSIS — Z992 Dependence on renal dialysis: Secondary | ICD-10-CM | POA: Diagnosis not present

## 2017-05-22 DIAGNOSIS — N186 End stage renal disease: Secondary | ICD-10-CM | POA: Diagnosis not present

## 2017-05-22 DIAGNOSIS — T82898A Other specified complication of vascular prosthetic devices, implants and grafts, initial encounter: Secondary | ICD-10-CM | POA: Diagnosis not present

## 2017-05-22 DIAGNOSIS — E8770 Fluid overload, unspecified: Secondary | ICD-10-CM | POA: Diagnosis not present

## 2017-05-22 DIAGNOSIS — Z23 Encounter for immunization: Secondary | ICD-10-CM | POA: Diagnosis not present

## 2017-05-23 ENCOUNTER — Encounter
Admission: RE | Admit: 2017-05-23 | Discharge: 2017-05-23 | Disposition: A | Discharge status: Home / Self Care | Payer: Medicare Other | Source: Ambulatory Visit | Attending: Vascular Surgery | Admitting: Vascular Surgery

## 2017-05-23 DIAGNOSIS — Z7901 Long term (current) use of anticoagulants: Secondary | ICD-10-CM | POA: Diagnosis not present

## 2017-05-23 DIAGNOSIS — I10 Essential (primary) hypertension: Secondary | ICD-10-CM | POA: Insufficient documentation

## 2017-05-23 DIAGNOSIS — Z01818 Encounter for other preprocedural examination: Secondary | ICD-10-CM | POA: Insufficient documentation

## 2017-05-23 HISTORY — DX: Polyneuropathy, unspecified: G62.9

## 2017-05-23 HISTORY — DX: Anemia, unspecified: D64.9

## 2017-05-23 HISTORY — DX: Gastro-esophageal reflux disease without esophagitis: K21.9

## 2017-05-23 LAB — COMPREHENSIVE METABOLIC PANEL
ALT: 22 U/L (ref 17–63)
AST: 17 U/L (ref 15–41)
Albumin: 3.9 g/dL (ref 3.5–5.0)
Alkaline Phosphatase: 107 U/L (ref 38–126)
Anion gap: 14 (ref 5–15)
BILIRUBIN TOTAL: 0.5 mg/dL (ref 0.3–1.2)
BUN: 42 mg/dL — AB (ref 6–20)
CO2: 26 mmol/L (ref 22–32)
CREATININE: 5.72 mg/dL — AB (ref 0.61–1.24)
Calcium: 8.6 mg/dL — ABNORMAL LOW (ref 8.9–10.3)
Chloride: 98 mmol/L — ABNORMAL LOW (ref 101–111)
GFR calc Af Amer: 12 mL/min — ABNORMAL LOW (ref >60)
GFR, EST NON AFRICAN AMERICAN: 10 mL/min — AB (ref >60)
Glucose, Bld: 52 mg/dL — ABNORMAL LOW (ref 65–99)
Potassium: 3.7 mmol/L (ref 3.5–5.1)
Sodium: 138 mmol/L (ref 135–145)
TOTAL PROTEIN: 7.9 g/dL (ref 6.5–8.1)

## 2017-05-23 LAB — SURGICAL PCR SCREEN
MRSA, PCR: NEGATIVE
STAPHYLOCOCCUS AUREUS: NEGATIVE

## 2017-05-23 LAB — PROTIME-INR
INR: 1.07
PROTHROMBIN TIME: 13.9 s (ref 11.4–15.2)

## 2017-05-23 LAB — APTT: aPTT: 30 seconds (ref 24–36)

## 2017-05-23 LAB — TYPE AND SCREEN
ABO/RH(D): O POS
Antibody Screen: NEGATIVE

## 2017-05-23 NOTE — Patient Instructions (Signed)
  Your procedure is scheduled on: 05/29/17 Report to Day Surgery. Medical mall second floor To find out your arrival time please call [phone] between 1PM - 3PM on 05/28/17  Remember: Instructions that are not followed completely may result in serious medical risk, up to and including death, or upon the discretion of your surgeon and anesthesiologist your surgery may need to be rescheduled.    __x__ 1. Do not eat food or drink liquids after midnight. No gum chewing or hard candies.     _x___ 2. No Alcohol for 24 hours before or after surgery.   ____ 3. Do Not Smoke For 24 Hours Prior to Your Surgery.   ____ 4. Bring all medications with you on the day of surgery if instructed.    __x__ 5. Notify your doctor if there is any change in your medical condition     (cold, fever, infections).       Do not wear jewelry, make-up, hairpins, clips or nail polish.  Do not wear lotions, powders, or perfumes. You may wear deodorant.  Do not shave 48 hours prior to surgery. Men may shave face and neck.  Do not bring valuables to the hospital.    [facility] is not responsible for any belongings or valuables.               Contacts, dentures or bridgework may not be worn into surgery.  Leave your suitcase in the car. After surgery it may be brought to your room.  For patients admitted to the hospital, discharge time is determined by your                treatment team.   Patients discharged the day of surgery will not be allowed to drive home.   Please read over the following fact sheets that you were given:   Surgical Site Infection Prevention / mrsa  __x__ Take these medicines the morning of surgery with A SIP OF WATER:    1.gabapentin  2. Ranitidine at bedtime 05/28/17 and am surgery  3.   4.  5.  6.  ____ Fleet Enema (as directed)   __x__ Use sage wipes as directed  ____ Use inhalers on the day of surgery  ____ Stop metformin 2 days prior to surgery    __x__ Take 1/2 of usual  insulin dose the night before surgery and none on the morning of surgery.   IF DOCTOR CHANGES LEVEMIR TO PM TAKE 1/2 AT BEDTIME 05/28/17 DO NOT TAKE ANY INSULIN AM OF SURGERY  ____ Stop Coumadin/Plavix/aspirin on  ____ Stop Anti-inflammatories on  ____ Stop supplements until after surgery.    ____ Bring C-Pap to the hospital.

## 2017-05-24 DIAGNOSIS — E8770 Fluid overload, unspecified: Secondary | ICD-10-CM | POA: Diagnosis not present

## 2017-05-24 DIAGNOSIS — Z992 Dependence on renal dialysis: Secondary | ICD-10-CM | POA: Diagnosis not present

## 2017-05-24 DIAGNOSIS — T82898A Other specified complication of vascular prosthetic devices, implants and grafts, initial encounter: Secondary | ICD-10-CM | POA: Diagnosis not present

## 2017-05-24 DIAGNOSIS — D509 Iron deficiency anemia, unspecified: Secondary | ICD-10-CM | POA: Diagnosis not present

## 2017-05-24 DIAGNOSIS — N186 End stage renal disease: Secondary | ICD-10-CM | POA: Diagnosis not present

## 2017-05-24 DIAGNOSIS — Z23 Encounter for immunization: Secondary | ICD-10-CM | POA: Diagnosis not present

## 2017-05-27 DIAGNOSIS — Z23 Encounter for immunization: Secondary | ICD-10-CM | POA: Diagnosis not present

## 2017-05-27 DIAGNOSIS — D509 Iron deficiency anemia, unspecified: Secondary | ICD-10-CM | POA: Diagnosis not present

## 2017-05-27 DIAGNOSIS — N186 End stage renal disease: Secondary | ICD-10-CM | POA: Diagnosis not present

## 2017-05-27 DIAGNOSIS — Z992 Dependence on renal dialysis: Secondary | ICD-10-CM | POA: Diagnosis not present

## 2017-05-27 DIAGNOSIS — T82898A Other specified complication of vascular prosthetic devices, implants and grafts, initial encounter: Secondary | ICD-10-CM | POA: Diagnosis not present

## 2017-05-27 DIAGNOSIS — E8770 Fluid overload, unspecified: Secondary | ICD-10-CM | POA: Diagnosis not present

## 2017-05-28 ENCOUNTER — Encounter (INDEPENDENT_AMBULATORY_CARE_PROVIDER_SITE_OTHER): Payer: Self-pay

## 2017-05-28 DIAGNOSIS — E8770 Fluid overload, unspecified: Secondary | ICD-10-CM | POA: Diagnosis not present

## 2017-05-28 DIAGNOSIS — T82898A Other specified complication of vascular prosthetic devices, implants and grafts, initial encounter: Secondary | ICD-10-CM | POA: Diagnosis not present

## 2017-05-28 DIAGNOSIS — D509 Iron deficiency anemia, unspecified: Secondary | ICD-10-CM | POA: Diagnosis not present

## 2017-05-28 DIAGNOSIS — N186 End stage renal disease: Secondary | ICD-10-CM | POA: Diagnosis not present

## 2017-05-28 DIAGNOSIS — Z23 Encounter for immunization: Secondary | ICD-10-CM | POA: Diagnosis not present

## 2017-05-28 DIAGNOSIS — Z992 Dependence on renal dialysis: Secondary | ICD-10-CM | POA: Diagnosis not present

## 2017-05-31 DIAGNOSIS — N186 End stage renal disease: Secondary | ICD-10-CM | POA: Diagnosis not present

## 2017-05-31 DIAGNOSIS — E8770 Fluid overload, unspecified: Secondary | ICD-10-CM | POA: Diagnosis not present

## 2017-05-31 DIAGNOSIS — D509 Iron deficiency anemia, unspecified: Secondary | ICD-10-CM | POA: Diagnosis not present

## 2017-05-31 DIAGNOSIS — Z23 Encounter for immunization: Secondary | ICD-10-CM | POA: Diagnosis not present

## 2017-05-31 DIAGNOSIS — Z992 Dependence on renal dialysis: Secondary | ICD-10-CM | POA: Diagnosis not present

## 2017-05-31 DIAGNOSIS — T82898A Other specified complication of vascular prosthetic devices, implants and grafts, initial encounter: Secondary | ICD-10-CM | POA: Diagnosis not present

## 2017-06-03 DIAGNOSIS — N186 End stage renal disease: Secondary | ICD-10-CM | POA: Diagnosis not present

## 2017-06-03 DIAGNOSIS — E8770 Fluid overload, unspecified: Secondary | ICD-10-CM | POA: Diagnosis not present

## 2017-06-03 DIAGNOSIS — Z23 Encounter for immunization: Secondary | ICD-10-CM | POA: Diagnosis not present

## 2017-06-03 DIAGNOSIS — Z992 Dependence on renal dialysis: Secondary | ICD-10-CM | POA: Diagnosis not present

## 2017-06-03 DIAGNOSIS — D509 Iron deficiency anemia, unspecified: Secondary | ICD-10-CM | POA: Diagnosis not present

## 2017-06-03 DIAGNOSIS — T82898A Other specified complication of vascular prosthetic devices, implants and grafts, initial encounter: Secondary | ICD-10-CM | POA: Diagnosis not present

## 2017-06-05 DIAGNOSIS — D509 Iron deficiency anemia, unspecified: Secondary | ICD-10-CM | POA: Diagnosis not present

## 2017-06-05 DIAGNOSIS — T82898A Other specified complication of vascular prosthetic devices, implants and grafts, initial encounter: Secondary | ICD-10-CM | POA: Diagnosis not present

## 2017-06-05 DIAGNOSIS — E8770 Fluid overload, unspecified: Secondary | ICD-10-CM | POA: Diagnosis not present

## 2017-06-05 DIAGNOSIS — Z23 Encounter for immunization: Secondary | ICD-10-CM | POA: Diagnosis not present

## 2017-06-05 DIAGNOSIS — N186 End stage renal disease: Secondary | ICD-10-CM | POA: Diagnosis not present

## 2017-06-05 DIAGNOSIS — Z992 Dependence on renal dialysis: Secondary | ICD-10-CM | POA: Diagnosis not present

## 2017-06-07 DIAGNOSIS — D509 Iron deficiency anemia, unspecified: Secondary | ICD-10-CM | POA: Diagnosis not present

## 2017-06-07 DIAGNOSIS — N186 End stage renal disease: Secondary | ICD-10-CM | POA: Diagnosis not present

## 2017-06-07 DIAGNOSIS — E8770 Fluid overload, unspecified: Secondary | ICD-10-CM | POA: Diagnosis not present

## 2017-06-07 DIAGNOSIS — Z23 Encounter for immunization: Secondary | ICD-10-CM | POA: Diagnosis not present

## 2017-06-07 DIAGNOSIS — Z992 Dependence on renal dialysis: Secondary | ICD-10-CM | POA: Diagnosis not present

## 2017-06-07 DIAGNOSIS — T82898A Other specified complication of vascular prosthetic devices, implants and grafts, initial encounter: Secondary | ICD-10-CM | POA: Diagnosis not present

## 2017-06-10 DIAGNOSIS — R110 Nausea: Secondary | ICD-10-CM | POA: Diagnosis not present

## 2017-06-10 DIAGNOSIS — N186 End stage renal disease: Secondary | ICD-10-CM | POA: Diagnosis not present

## 2017-06-10 DIAGNOSIS — Z992 Dependence on renal dialysis: Secondary | ICD-10-CM | POA: Diagnosis not present

## 2017-06-10 DIAGNOSIS — D509 Iron deficiency anemia, unspecified: Secondary | ICD-10-CM | POA: Diagnosis not present

## 2017-06-10 DIAGNOSIS — R1110 Vomiting, unspecified: Secondary | ICD-10-CM | POA: Diagnosis not present

## 2017-06-10 DIAGNOSIS — D631 Anemia in chronic kidney disease: Secondary | ICD-10-CM | POA: Diagnosis not present

## 2017-06-13 DIAGNOSIS — D509 Iron deficiency anemia, unspecified: Secondary | ICD-10-CM | POA: Diagnosis not present

## 2017-06-13 DIAGNOSIS — Z992 Dependence on renal dialysis: Secondary | ICD-10-CM | POA: Diagnosis not present

## 2017-06-13 DIAGNOSIS — D631 Anemia in chronic kidney disease: Secondary | ICD-10-CM | POA: Diagnosis not present

## 2017-06-13 DIAGNOSIS — R110 Nausea: Secondary | ICD-10-CM | POA: Diagnosis not present

## 2017-06-13 DIAGNOSIS — N186 End stage renal disease: Secondary | ICD-10-CM | POA: Diagnosis not present

## 2017-06-13 DIAGNOSIS — R1110 Vomiting, unspecified: Secondary | ICD-10-CM | POA: Diagnosis not present

## 2017-06-13 MED ORDER — CEFAZOLIN SODIUM-DEXTROSE 2-4 GM/100ML-% IV SOLN
2.0000 g | Freq: Once | INTRAVENOUS | Status: AC
  Administered 2017-06-14: 2 g via INTRAVENOUS

## 2017-06-14 ENCOUNTER — Ambulatory Visit: Payer: Medicare Other | Admitting: Anesthesiology

## 2017-06-14 ENCOUNTER — Ambulatory Visit
Admission: RE | Admit: 2017-06-14 | Discharge: 2017-06-14 | Disposition: A | Discharge status: Home / Self Care | Payer: Medicare Other | Source: Ambulatory Visit | Attending: Vascular Surgery | Admitting: Vascular Surgery

## 2017-06-14 ENCOUNTER — Encounter
Admission: RE | Disposition: A | Discharge status: Home / Self Care | Payer: Self-pay | Source: Ambulatory Visit | Attending: Vascular Surgery

## 2017-06-14 DIAGNOSIS — N186 End stage renal disease: Secondary | ICD-10-CM | POA: Diagnosis not present

## 2017-06-14 DIAGNOSIS — K219 Gastro-esophageal reflux disease without esophagitis: Secondary | ICD-10-CM | POA: Diagnosis not present

## 2017-06-14 DIAGNOSIS — I10 Essential (primary) hypertension: Secondary | ICD-10-CM | POA: Diagnosis not present

## 2017-06-14 DIAGNOSIS — E7800 Pure hypercholesterolemia, unspecified: Secondary | ICD-10-CM | POA: Insufficient documentation

## 2017-06-14 DIAGNOSIS — I120 Hypertensive chronic kidney disease with stage 5 chronic kidney disease or end stage renal disease: Secondary | ICD-10-CM | POA: Diagnosis not present

## 2017-06-14 DIAGNOSIS — E1140 Type 2 diabetes mellitus with diabetic neuropathy, unspecified: Secondary | ICD-10-CM | POA: Diagnosis not present

## 2017-06-14 DIAGNOSIS — Z8249 Family history of ischemic heart disease and other diseases of the circulatory system: Secondary | ICD-10-CM | POA: Diagnosis not present

## 2017-06-14 DIAGNOSIS — D631 Anemia in chronic kidney disease: Secondary | ICD-10-CM | POA: Insufficient documentation

## 2017-06-14 DIAGNOSIS — Z992 Dependence on renal dialysis: Secondary | ICD-10-CM | POA: Insufficient documentation

## 2017-06-14 DIAGNOSIS — E781 Pure hyperglyceridemia: Secondary | ICD-10-CM | POA: Diagnosis not present

## 2017-06-14 DIAGNOSIS — E1122 Type 2 diabetes mellitus with diabetic chronic kidney disease: Secondary | ICD-10-CM | POA: Diagnosis not present

## 2017-06-14 DIAGNOSIS — Z6841 Body Mass Index (BMI) 40.0 and over, adult: Secondary | ICD-10-CM | POA: Diagnosis not present

## 2017-06-14 DIAGNOSIS — G4730 Sleep apnea, unspecified: Secondary | ICD-10-CM | POA: Diagnosis not present

## 2017-06-14 DIAGNOSIS — Z885 Allergy status to narcotic agent status: Secondary | ICD-10-CM | POA: Insufficient documentation

## 2017-06-14 HISTORY — PX: AV FISTULA PLACEMENT: SHX1204

## 2017-06-14 LAB — POCT I-STAT 4, (NA,K, GLUC, HGB,HCT)
GLUCOSE: 263 mg/dL — AB (ref 65–99)
HCT: 40 % (ref 39.0–52.0)
Hemoglobin: 13.6 g/dL (ref 13.0–17.0)
POTASSIUM: 4.7 mmol/L (ref 3.5–5.1)
Sodium: 131 mmol/L — ABNORMAL LOW (ref 135–145)

## 2017-06-14 LAB — TYPE AND SCREEN
ABO/RH(D): O POS
ANTIBODY SCREEN: NEGATIVE

## 2017-06-14 LAB — GLUCOSE, CAPILLARY
Glucose-Capillary: 254 mg/dL — ABNORMAL HIGH (ref 65–99)
Glucose-Capillary: 268 mg/dL — ABNORMAL HIGH (ref 65–99)

## 2017-06-14 SURGERY — ARTERIOVENOUS (AV) FISTULA CREATION
Anesthesia: General | Laterality: Right

## 2017-06-14 MED ORDER — FAMOTIDINE 20 MG PO TABS
20.0000 mg | ORAL_TABLET | Freq: Once | ORAL | Status: AC
  Administered 2017-06-14: 20 mg via ORAL

## 2017-06-14 MED ORDER — SUGAMMADEX SODIUM 200 MG/2ML IV SOLN
INTRAVENOUS | Status: AC
  Filled 2017-06-14: qty 2

## 2017-06-14 MED ORDER — SUGAMMADEX SODIUM 500 MG/5ML IV SOLN
INTRAVENOUS | Status: DC | PRN
  Administered 2017-06-14: 277.6 mg via INTRAVENOUS

## 2017-06-14 MED ORDER — BUPIVACAINE HCL (PF) 0.5 % IJ SOLN
INTRAMUSCULAR | Status: AC
  Filled 2017-06-14: qty 30

## 2017-06-14 MED ORDER — INSULIN ASPART 100 UNIT/ML ~~LOC~~ SOLN
20.0000 [IU] | Freq: Once | SUBCUTANEOUS | Status: AC
  Administered 2017-06-14: 20 [IU] via SUBCUTANEOUS

## 2017-06-14 MED ORDER — SODIUM CHLORIDE 0.9 % IV SOLN
INTRAVENOUS | Status: DC
  Administered 2017-06-14: 11:00:00 via INTRAVENOUS

## 2017-06-14 MED ORDER — SODIUM CHLORIDE 0.9 % IV SOLN: INTRAVENOUS | Status: DC

## 2017-06-14 MED ORDER — ACETAMINOPHEN 10 MG/ML IV SOLN
INTRAVENOUS | Status: DC | PRN
  Administered 2017-06-14: 1000 mg via INTRAVENOUS

## 2017-06-14 MED ORDER — ONDANSETRON HCL 4 MG/2ML IJ SOLN
4.0000 mg | Freq: Once | INTRAMUSCULAR | Status: AC | PRN
  Administered 2017-06-14: 4 mg via INTRAVENOUS

## 2017-06-14 MED ORDER — PROPOFOL 10 MG/ML IV BOLUS
INTRAVENOUS | Status: DC | PRN
  Administered 2017-06-14: 200 mg via INTRAVENOUS

## 2017-06-14 MED ORDER — HYDROCODONE-ACETAMINOPHEN 5-325 MG PO TABS: 1.0000 | ORAL_TABLET | Freq: Four times a day (QID) | ORAL | 0 refills | Status: DC | PRN

## 2017-06-14 MED ORDER — ROCURONIUM BROMIDE 50 MG/5ML IV SOLN
INTRAVENOUS | Status: AC
  Filled 2017-06-14: qty 1

## 2017-06-14 MED ORDER — FENTANYL CITRATE (PF) 100 MCG/2ML IJ SOLN
25.0000 ug | INTRAMUSCULAR | Status: DC | PRN
  Administered 2017-06-14 (×2): 100 ug via INTRAVENOUS

## 2017-06-14 MED ORDER — MIDAZOLAM HCL 2 MG/2ML IJ SOLN
INTRAMUSCULAR | Status: DC | PRN
  Administered 2017-06-14: 2 mg via INTRAVENOUS

## 2017-06-14 MED ORDER — PAPAVERINE HCL 30 MG/ML IJ SOLN
INTRAMUSCULAR | Status: AC
  Filled 2017-06-14: qty 2

## 2017-06-14 MED ORDER — LIDOCAINE HCL (CARDIAC) 20 MG/ML IV SOLN
INTRAVENOUS | Status: DC | PRN
  Administered 2017-06-14: 100 mg via INTRAVENOUS

## 2017-06-14 MED ORDER — FAMOTIDINE 20 MG PO TABS
ORAL_TABLET | ORAL | Status: AC
  Filled 2017-06-14: qty 1

## 2017-06-14 MED ORDER — PROPOFOL 10 MG/ML IV BOLUS
INTRAVENOUS | Status: AC
  Filled 2017-06-14: qty 20

## 2017-06-14 MED ORDER — ROCURONIUM BROMIDE 100 MG/10ML IV SOLN
INTRAVENOUS | Status: DC | PRN
  Administered 2017-06-14: 25 mg via INTRAVENOUS

## 2017-06-14 MED ORDER — LIDOCAINE HCL (PF) 2 % IJ SOLN
INTRAMUSCULAR | Status: AC
  Filled 2017-06-14: qty 4

## 2017-06-14 MED ORDER — ONDANSETRON HCL 4 MG/2ML IJ SOLN
INTRAMUSCULAR | Status: AC
  Filled 2017-06-14: qty 2

## 2017-06-14 MED ORDER — FENTANYL CITRATE (PF) 100 MCG/2ML IJ SOLN
INTRAMUSCULAR | Status: AC
  Filled 2017-06-14: qty 2

## 2017-06-14 MED ORDER — HEPARIN SODIUM (PORCINE) 5000 UNIT/ML IJ SOLN
INTRAMUSCULAR | Status: AC
  Filled 2017-06-14: qty 1

## 2017-06-14 MED ORDER — MIDAZOLAM HCL 2 MG/2ML IJ SOLN
INTRAMUSCULAR | Status: AC
  Filled 2017-06-14: qty 2

## 2017-06-14 MED ORDER — ACETAMINOPHEN 10 MG/ML IV SOLN
INTRAVENOUS | Status: AC
  Filled 2017-06-14: qty 100

## 2017-06-14 MED ORDER — INSULIN ASPART 100 UNIT/ML ~~LOC~~ SOLN
SUBCUTANEOUS | Status: AC
  Filled 2017-06-14: qty 1

## 2017-06-14 MED ORDER — SODIUM CHLORIDE 0.9 % IV SOLN
INTRAVENOUS | Status: DC | PRN
  Administered 2017-06-14: 13:00:00 5 mL via INTRAMUSCULAR

## 2017-06-14 SURGICAL SUPPLY — 63 items
ADH SKN CLS APL DERMABOND .7 (GAUZE/BANDAGES/DRESSINGS) ×1
APPLIER CLIP 11 MED OPEN (CLIP)
APPLIER CLIP 9.375 SM OPEN (CLIP)
APR CLP MED 11 20 MLT OPN (CLIP)
APR CLP SM 9.3 20 MLT OPN (CLIP)
BAG DECANTER FOR FLEXI CONT (MISCELLANEOUS) ×3 IMPLANT
BLADE SURG SZ11 CARB STEEL (BLADE) ×3 IMPLANT
BOOT SUTURE AID YELLOW STND (SUTURE) ×3 IMPLANT
BRUSH SCRUB EZ  4% CHG (MISCELLANEOUS) ×2
BRUSH SCRUB EZ 4% CHG (MISCELLANEOUS) ×1 IMPLANT
CANISTER SUCT 1200ML W/VALVE (MISCELLANEOUS) ×3 IMPLANT
CHLORAPREP W/TINT 26ML (MISCELLANEOUS) ×3 IMPLANT
CLIP APPLIE 11 MED OPEN (CLIP) IMPLANT
CLIP APPLIE 9.375 SM OPEN (CLIP) IMPLANT
DERMABOND ADVANCED (GAUZE/BANDAGES/DRESSINGS) ×2
DERMABOND ADVANCED .7 DNX12 (GAUZE/BANDAGES/DRESSINGS) ×1 IMPLANT
DRESSING SURGICEL FIBRLLR 1X2 (HEMOSTASIS) ×1 IMPLANT
DRSG SURGICEL FIBRILLAR 1X2 (HEMOSTASIS) ×3
ELECT CAUTERY BLADE 6.4 (BLADE) ×3 IMPLANT
ELECT REM PT RETURN 9FT ADLT (ELECTROSURGICAL) ×3
ELECTRODE REM PT RTRN 9FT ADLT (ELECTROSURGICAL) ×1 IMPLANT
GEL ULTRASOUND 20GR AQUASONIC (MISCELLANEOUS) IMPLANT
GLOVE BIO SURGEON STRL SZ7 (GLOVE) ×3 IMPLANT
GLOVE INDICATOR 7.5 STRL GRN (GLOVE) ×3 IMPLANT
GLOVE SURG SYN 8.0 (GLOVE) ×3 IMPLANT
GLOVE SURG SYN 8.0 PF PI (GLOVE) ×1 IMPLANT
GOWN STRL REUS W/ TWL LRG LVL3 (GOWN DISPOSABLE) ×2 IMPLANT
GOWN STRL REUS W/ TWL XL LVL3 (GOWN DISPOSABLE) ×1 IMPLANT
GOWN STRL REUS W/TWL LRG LVL3 (GOWN DISPOSABLE) ×6
GOWN STRL REUS W/TWL XL LVL3 (GOWN DISPOSABLE) ×3
IV NS 500ML (IV SOLUTION) ×3
IV NS 500ML BAXH (IV SOLUTION) ×1 IMPLANT
KIT RM TURNOVER STRD PROC AR (KITS) ×3 IMPLANT
LABEL OR SOLS (LABEL) ×3 IMPLANT
LOOP RED MAXI  1X406MM (MISCELLANEOUS) ×2
LOOP VESSEL MAXI 1X406 RED (MISCELLANEOUS) ×1 IMPLANT
LOOP VESSEL MINI 0.8X406 BLUE (MISCELLANEOUS) ×2 IMPLANT
LOOPS BLUE MINI 0.8X406MM (MISCELLANEOUS) ×4
NDL FILTER BLUNT 18X1 1/2 (NEEDLE) ×1 IMPLANT
NDL HYPO 30X.5 LL (NEEDLE) IMPLANT
NEEDLE FILTER BLUNT 18X 1/2SAF (NEEDLE) ×2
NEEDLE FILTER BLUNT 18X1 1/2 (NEEDLE) ×1 IMPLANT
NEEDLE HYPO 30X.5 LL (NEEDLE) IMPLANT
NS IRRIG 500ML POUR BTL (IV SOLUTION) ×3 IMPLANT
PACK EXTREMITY ARMC (MISCELLANEOUS) ×3 IMPLANT
PAD PREP 24X41 OB/GYN DISP (PERSONAL CARE ITEMS) ×1 IMPLANT
PUNCH SURGICAL ROTATE 2.7MM (MISCELLANEOUS) IMPLANT
STOCKINETTE 48X4 2 PLY STRL (GAUZE/BANDAGES/DRESSINGS) ×1 IMPLANT
STOCKINETTE STRL 4IN 9604848 (GAUZE/BANDAGES/DRESSINGS) ×3 IMPLANT
SUT MNCRL+ 5-0 UNDYED PC-3 (SUTURE) ×1 IMPLANT
SUT MONOCRYL 5-0 (SUTURE) ×2
SUT PROLENE 6 0 BV (SUTURE) ×12 IMPLANT
SUT SILK 2 0 (SUTURE) ×3
SUT SILK 2-0 18XBRD TIE 12 (SUTURE) ×1 IMPLANT
SUT SILK 3 0 (SUTURE) ×3
SUT SILK 3-0 18XBRD TIE 12 (SUTURE) ×1 IMPLANT
SUT SILK 4 0 (SUTURE) ×3
SUT SILK 4-0 18XBRD TIE 12 (SUTURE) ×1 IMPLANT
SUT VIC AB 3-0 SH 27 (SUTURE) ×3
SUT VIC AB 3-0 SH 27X BRD (SUTURE) ×1 IMPLANT
SYR 20CC LL (SYRINGE) ×3 IMPLANT
SYR 3ML LL SCALE MARK (SYRINGE) ×3 IMPLANT
TOWEL OR 17X26 4PK STRL BLUE (TOWEL DISPOSABLE) IMPLANT

## 2017-06-14 NOTE — Anesthesia Preprocedure Evaluation (Signed)
Anesthesia Evaluation  Patient identified by MRN, date of birth, ID band Patient awake    Reviewed: Allergy & Precautions, NPO status , Patient's Chart, lab work & pertinent test results  History of Anesthesia Complications Negative for: history of anesthetic complications  Airway Mallampati: II       Dental   Pulmonary sleep apnea , neg COPD,           Cardiovascular hypertension, Pt. on medications and Pt. on home beta blockers      Neuro/Psych Depression    GI/Hepatic Neg liver ROS, GERD  Medicated and Poorly Controlled,  Endo/Other  diabetes, Type 2, Insulin DependentMorbid obesity  Renal/GU ESRF and DialysisRenal disease     Musculoskeletal   Abdominal   Peds  Hematology   Anesthesia Other Findings   Reproductive/Obstetrics                            Anesthesia Physical Anesthesia Plan  ASA: IV  Anesthesia Plan: General   Post-op Pain Management:    Induction:   PONV Risk Score and Plan: 2 and Ondansetron and Dexamethasone  Airway Management Planned: Oral ETT  Additional Equipment:   Intra-op Plan:   Post-operative Plan:   Informed Consent: I have reviewed the patients History and Physical, chart, labs and discussed the procedure including the risks, benefits and alternatives for the proposed anesthesia with the patient or authorized representative who has indicated his/her understanding and acceptance.     Plan Discussed with:   Anesthesia Plan Comments:         Anesthesia Quick Evaluation

## 2017-06-14 NOTE — Anesthesia Procedure Notes (Signed)
Procedure Name: Intubation Date/Time: 06/14/2017 11:34 AM Performed by: Nelda Marseille Pre-anesthesia Checklist: Patient identified, Patient being monitored, Timeout performed, Emergency Drugs available and Suction available Patient Re-evaluated:Patient Re-evaluated prior to induction Oxygen Delivery Method: Circle System Utilized Preoxygenation: Pre-oxygenation with 100% oxygen Induction Type: IV induction Ventilation: Mask ventilation without difficulty Laryngoscope Size: Mac, 3 and McGraph Grade View: Grade I Tube type: Oral Tube size: 7.5 mm Number of attempts: 1 Airway Equipment and Method: Stylet and Video-laryngoscopy Placement Confirmation: ETT inserted through vocal cords under direct vision,  positive ETCO2 and breath sounds checked- equal and bilateral Secured at: 22 cm Tube secured with: Tape Dental Injury: Teeth and Oropharynx as per pre-operative assessment

## 2017-06-14 NOTE — Discharge Instructions (Signed)
AMBULATORY SURGERY  °DISCHARGE INSTRUCTIONS ° ° °1) The drugs that you were given will stay in your system until tomorrow so for the next 24 hours you should not: ° °A) Drive an automobile °B) Make any legal decisions °C) Drink any alcoholic beverage ° ° °2) You may resume regular meals tomorrow.  Today it is better to start with liquids and gradually work up to solid foods. ° °You may eat anything you prefer, but it is better to start with liquids, then soup and crackers, and gradually work up to solid foods. ° ° °3) Please notify your doctor immediately if you have any unusual bleeding, trouble breathing, redness and pain at the surgery site, drainage, fever, or pain not relieved by medication. ° ° ° °4) Additional Instructions: ° ° ° ° ° ° ° °Please contact your physician with any problems or Same Day Surgery at [phone], Monday through Friday 6 am to 4 pm, or [facility] Main number at [phone].AMBULATORY SURGERY  °DISCHARGE INSTRUCTIONS ° ° °5) The drugs that you were given will stay in your system until tomorrow so for the next 24 hours you should not: ° °D) Drive an automobile °E) Make any legal decisions °F) Drink any alcoholic beverage ° ° °6) You may resume regular meals tomorrow.  Today it is better to start with liquids and gradually work up to solid foods. ° °You may eat anything you prefer, but it is better to start with liquids, then soup and crackers, and gradually work up to solid foods. ° ° °7) Please notify your doctor immediately if you have any unusual bleeding, trouble breathing, redness and pain at the surgery site, drainage, fever, or pain not relieved by medication. ° ° ° °8) Additional Instructions: ° ° ° ° ° ° ° °Please contact your physician with any problems or Same Day Surgery at [phone], Monday through Friday 6 am to 4 pm, or [facility] Main number at [phone]. °

## 2017-06-14 NOTE — Addendum Note (Signed)
Addendum  created 06/14/17 1331 by Gunnar Fusi, MD   Anesthesia Review and Sign - Ready for Procedure

## 2017-06-14 NOTE — Transfer of Care (Signed)
Immediate Anesthesia Transfer of Care Note  Patient: Steven [name]  Procedure(s) Performed: Procedure(s): ARTERIOVENOUS (AV) FISTULA CREATION WRIST (Right)  Patient Location: PACU  Anesthesia Type:General  Level of Consciousness: awake, alert  and oriented  Airway & Oxygen Therapy: Patient Spontanous Breathing  Post-op Assessment: Report given to RN and Post -op Vital signs reviewed and stable  Post vital signs: Reviewed and stable  Last Vitals:  Vitals:   06/14/17 0920  BP: (!) 148/86  Pulse: 76  Resp: (!) 22  Temp: (!) 36.2 C  SpO2: 98%    Last Pain:  Vitals:   06/14/17 0920  TempSrc: Tympanic         Complications: No apparent anesthesia complications

## 2017-06-14 NOTE — Op Note (Signed)
     OPERATIVE NOTE   PROCEDURE: right radiocephalic arteriovenous fistula placement  PRE-OPERATIVE DIAGNOSIS: End Stage Renal Disease  POST-OPERATIVE DIAGNOSIS: End Stage Renal Disease  SURGEON: Hortencia Pilar  ASSISTANT(S): none  ANESTHESIA: general  ESTIMATED BLOOD LOSS: <50 cc  FINDING(S): 3.5 mm vein  SPECIMEN(S):  none  INDICATIONS:   Steven [name] is a 56 y.o. male who presents with end stage renal disease.  The patient is scheduled for right brachiocephalic arteriovenous fistula placement.  The patient is aware the risks include but are not limited to: bleeding, infection, steal syndrome, nerve damage, ischemic monomelic neuropathy, failure to mature, and need for additional procedures.  The patient is aware of the risks of the procedure and elects to proceed forward.  DESCRIPTION: After full informed written consent was obtained from the patient, the patient was brought back to the operating room and placed supine upon the operating table.  Prior to induction, the patient received IV antibiotics.   After obtaining adequate anesthesia, the patient was then prepped and draped in the standard fashion for a right arm access procedure.    A linear incision was then created midway between the radial impulse and the cephalic vein. The cephalic vein was then identified and dissected circumferentially. It was marked with a surgical marker.    Attention was then turned to the radial artery which was exposed through the same incision and looped proximally and distally. Side branches were controlled with 4-0 silk ties.  The distal segment of the vein was ligated with a  2-0 silk, and the vein was transected.  The proximal segment was interrogated with serial dilators.  The vein accepted up to a 3.5 mm dilator without any difficulty. Heparinized saline was infused into the vein and clamped it with a small bulldog.  At this point, I reset my exposure of the brachial artery and  controlled the artery with vessel loops proximally and distally.  An arteriotomy was then made with a #11 blade, and extended with a Potts scissor.  Heparinized saline was injected proximal and distal into the radial artery.  The vein was then approximated to the artery while the artery was in its native bed and subsequently the vein was beveled using Potts scissors. The vein was then sewn to the artery in an end-to-side configuration with a running stitch of 6-0 Prolene.  Prior to completing this anastomosis Flushing maneuvers were performed and the artery was allowed to forward and back bleed.  There was no evidence of clot from any vessels.  I completed the anastomosis in the usual fashion and then released all vessel loops and clamps.    There was good  thrill in the venous outflow, and there was 1+ palpable radial pulse.  At this point, I irrigated out the surgical wound.  There was no further active bleeding.  The subcutaneous tissue was reapproximated with a running stitch of 3-0 Vicryl.  The skin was then reapproximated with a running subcuticular stitch of 4-0 Vicryl.  The skin was then cleaned, dried, and reinforced with Dermabond.    The patient tolerated this procedure well.   COMPLICATIONS: None  CONDITION: [name]  Office: [phone]   06/14/2017, 12:53 PM

## 2017-06-14 NOTE — Anesthesia Postprocedure Evaluation (Signed)
Anesthesia Post Note  Patient: Steven [name]  Procedure(s) Performed: Procedure(s) (LRB): ARTERIOVENOUS (AV) FISTULA CREATION WRIST (Right)  Patient location during evaluation: PACU Anesthesia Type: General Level of consciousness: awake and alert Pain management: pain level controlled Vital Signs Assessment: post-procedure vital signs reviewed and stable Respiratory status: spontaneous breathing and respiratory function stable Cardiovascular status: stable Anesthetic complications: no     Last Vitals:  Vitals:   06/14/17 0920 06/14/17 1300  BP: (!) 148/86 (!) 120/96  Pulse: 76 74  Resp: (!) 22 (!) 9  Temp: (!) 36.2 C (!) 36.1 C  SpO2: 98% 98%    Last Pain:  Vitals:   06/14/17 0920  TempSrc: Tympanic                 Ikechukwu Cerny K

## 2017-06-14 NOTE — H&P (Signed)
[facility] Admission History & Physical  MRN : [id number]  Steven [name] is a 56 y.o. (July 24, 1961) male who presents with chief complaint of need a fistula for dialysis.  History of Present Illness: the patient presents to [facility] for creation of a right wrist fistula. He is had access in both arms. Unfortunately, secondary to a traumatic injury his left arm does not have much mobility and access in his nondominant arm is not feasible.  Patient is currently maintained via a tunneled catheter.  The patient denies amaurosis fugax or recent TIA symptoms. There are no recent neurological changes noted. The patient denies claudication symptoms or rest pain symptoms. The patient denies history of DVT, PE or superficial thrombophlebitis. The patient denies recent episodes of angina or shortness of breath.   Current Facility-Administered Medications  Medication Dose Route Frequency Provider Last Rate Last Dose  . 0.9 %  sodium chloride infusion   Intravenous Continuous [place], Amy, MD      . 0.9 %  sodium chloride infusion   Intravenous Continuous , Dolores Lory, MD      . ceFAZolin (ANCEF) IVPB 2g/100 mL premix  2 g Intravenous Once , Dolores Lory, MD      . famotidine (PEPCID) 20 MG tablet           . fentaNYL (SUBLIMAZE) injection 25 mcg  25 mcg Intravenous Q5 min PRN Gunnar Fusi, MD      . ondansetron [facility]) injection 4 mg  4 mg Intravenous Once PRN Gunnar Fusi, MD        Past Medical History:  Diagnosis Date  . Anemia   . Cellulitis and abscess of right lower extremity 03/01/2017  . Chronic kidney disease    dialysis m,w,f  . Diabetes mellitus without complication ([place])   . Edema extremities 03/01/2017   bilateral swelling  . GERD (gastroesophageal reflux disease)   . High cholesterol   . High triglycerides   . Hypertension   . Neuropathy   . Sleep apnea    can't use cpap d/t feelings of suffocation     Past Surgical History:  Procedure Laterality Date  . APPENDECTOMY  2010  . DIALYSIS/PERMA CATHETER INSERTION N/A 03/05/2017   Procedure: Dialysis/Perma Catheter Insertion;  Surgeon: Katha Cabal, MD;  Location: [place] [facility];  Service: Cardiovascular;  Laterality: N/A;  . EXCHANGE OF A DIALYSIS CATHETER  03/29/2017   Procedure: Exchange Of A Dialysis Catheter;  Surgeon: Katha Cabal, MD;  Location: [place] [facility];  Service: Cardiovascular;;  . IRRIGATION AND DEBRIDEMENT FOOT Right 03/01/2017   Procedure: IRRIGATION AND DEBRIDEMENT FOOT;  Surgeon: Albertine Patricia, DPM;  Location: [facility];  Service: Podiatry;  Laterality: Right;  application of wound vac    Social History Social History  Substance Use Topics  . Smoking status: Never Smoker  . Smokeless tobacco: Current User    Types: Snuff  . Alcohol use Yes     Comment: up until 01/2017 was heavy drinker...4 40 oz qd    Family History Family History  Problem Relation Age of Onset  . CAD Father   No family history of bleeding/clotting disorders, porphyria or autoimmune disease   Allergies  Allergen Reactions  . Codeine Nausea And Vomiting     REVIEW OF SYSTEMS (Negative unless checked)  Constitutional: [] Weight loss  [] Fever  [] Chills Cardiac: [] Chest pain   [] Chest pressure   [] Palpitations   [] Shortness of breath when laying  flat   [] Shortness of breath at rest   [] Shortness of breath with exertion. Vascular:  [] Pain in legs with walking   [] Pain in legs at rest   [] Pain in legs when laying flat   [] Claudication   [] Pain in feet when walking  [] Pain in feet at rest  [] Pain in feet when laying flat   [] History of DVT   [] Phlebitis   [] Swelling in legs   [] Varicose veins   [] Non-healing ulcers Pulmonary:   [] Uses home oxygen   [] Productive cough   [] Hemoptysis   [] Wheeze  [] COPD   [] Asthma Neurologic:  [] Dizziness  [] Blackouts   [] Seizures   [] History of stroke   [] History of TIA  [] Aphasia    [] Temporary blindness   [] Dysphagia   [x] Weakness or numbness in left arm   [] Weakness or numbness in legs Musculoskeletal:  [] Arthritis   [] Joint swelling   [] Joint pain   [] Low back pain Hematologic:  [] Easy bruising  [] Easy bleeding   [] Hypercoagulable state   [x] Anemic  [] Hepatitis Gastrointestinal:  [] Blood in stool   [] Vomiting blood  [] Gastroesophageal reflux/heartburn   [] Difficulty swallowing. Genitourinary:  [x] Chronic kidney disease   [] Difficult urination  [] Frequent urination  [] Burning with urination   [] Blood in urine Skin:  [] Rashes   [] Ulcers   [] Wounds Psychological:  [] History of anxiety   []  History of major depression.  Physical Examination  Vitals:   06/14/17 0920  BP: (!) 148/86  Pulse: 76  Resp: (!) 22  Temp: (!) 97.2 F (36.2 C)  TempSrc: Tympanic  SpO2: 98%  Weight: (!) 138.8 kg (306 lb)  Height: 5\' 10"  (1.778 m)   Body mass index is 43.91 kg/m. Gen: WD/WN, NAD Head: [place]/AT, No temporalis wasting. Prominent temp pulse not noted. Ear/Nose/Throat: Hearing grossly intact, nares w/o erythema or drainage, oropharynx w/o Erythema/Exudate,  Eyes: Conjunctiva clear, sclera non-icteric Neck: Trachea midline.  No JVD.  Pulmonary:  Good air movement, respirations not labored, no use of accessory muscles.  Cardiac: RRR, normal S1, S2. Vascular:  Vessel Right Left  Radial Palpable Palpable  Ulnar Palpable Palpable  Brachial Palpable Palpable  Carotid Palpable, without bruit Palpable, without bruit  Gastrointestinal: soft, non-tender/non-distended. No guarding/reflex.  Musculoskeletal: M/S 5/5 throughoutboth legs and the right arm the left arm is 3 out of 5.  Extremities without ischemic changes.  No deformity or atrophy of the right arm the left arm demonstrates moderate deformity at the elbow with atrophy of the forearm.  Neurologic: Sensation grossly intact in extremities.  Symmetrical.  Speech is fluent. Motor exam as listed above. Psychiatric: Judgment intact,  Mood & affect appropriate for pt's clinical situation. Dermatologic: No rashes or ulcers noted.  No cellulitis or open wounds. Lymph : No Cervical, Axillary, or Inguinal lymphadenopathy.     CBC Lab Results  Component Value Date   WBC 8.3 03/06/2017   HGB 13.6 06/14/2017   HCT 40.0 06/14/2017   MCV 86.2 03/06/2017   PLT 284 03/06/2017    BMET    Component Value Date/Time   NA 131 (L) 06/14/2017 0958   K 4.7 06/14/2017 0958   CL 98 (L) 05/23/2017 1103   CO2 26 05/23/2017 1103   GLUCOSE 263 (H) 06/14/2017 0958   BUN 42 (H) 05/23/2017 1103   CREATININE 5.72 (H) 05/23/2017 1103   CREATININE 1.41 (H) 12/07/2011 1030   CALCIUM 8.6 (L) 05/23/2017 1103   GFRNONAA 10 (L) 05/23/2017 1103   GFRAA 12 (L) 05/23/2017 1103   CrCl cannot be calculated (  Patient's most recent lab result is older than the maximum 21 days allowed.).  COAG Lab Results  Component Value Date   INR 1.07 05/23/2017    Radiology No results found.  Assessment/Plan   1. End stage renal disease ([place]) Recommend:  At this time the patient does not have appropriate extremity access for dialysis  Patient should have a right radiocephalic fistula created.  The risks, benefits and alternative therapies were reviewed in detail with the patient.  All questions were answered.  The patient agrees to proceed with surgery.    2. Complication of renal dialysis, sequela Continue with current dialysis plan  3. Gastroesophageal reflux disease without esophagitis Continue PPI as already ordered, these medications have been reviewed and there are no changes at this time.   4. Essential hypertension Continue antihypertensive medications as already ordered, these medications have been reviewed and there are no changes at this time.   Hortencia Pilar, MD  06/14/2017 11:15 AM

## 2017-06-14 NOTE — Anesthesia Post-op Follow-up Note (Signed)
Anesthesia QCDR form completed.        

## 2017-06-15 ENCOUNTER — Encounter: Payer: Self-pay | Admitting: Vascular Surgery

## 2017-06-15 DIAGNOSIS — Z992 Dependence on renal dialysis: Secondary | ICD-10-CM | POA: Diagnosis not present

## 2017-06-15 DIAGNOSIS — R110 Nausea: Secondary | ICD-10-CM | POA: Diagnosis not present

## 2017-06-15 DIAGNOSIS — R1110 Vomiting, unspecified: Secondary | ICD-10-CM | POA: Diagnosis not present

## 2017-06-15 DIAGNOSIS — N186 End stage renal disease: Secondary | ICD-10-CM | POA: Diagnosis not present

## 2017-06-15 DIAGNOSIS — D631 Anemia in chronic kidney disease: Secondary | ICD-10-CM | POA: Diagnosis not present

## 2017-06-15 DIAGNOSIS — D509 Iron deficiency anemia, unspecified: Secondary | ICD-10-CM | POA: Diagnosis not present

## 2017-06-16 DIAGNOSIS — Z48812 Encounter for surgical aftercare following surgery on the circulatory system: Secondary | ICD-10-CM | POA: Diagnosis not present

## 2017-06-16 DIAGNOSIS — Z794 Long term (current) use of insulin: Secondary | ICD-10-CM | POA: Diagnosis not present

## 2017-06-16 DIAGNOSIS — Z992 Dependence on renal dialysis: Secondary | ICD-10-CM | POA: Diagnosis not present

## 2017-06-16 DIAGNOSIS — E1122 Type 2 diabetes mellitus with diabetic chronic kidney disease: Secondary | ICD-10-CM | POA: Diagnosis not present

## 2017-06-16 DIAGNOSIS — I120 Hypertensive chronic kidney disease with stage 5 chronic kidney disease or end stage renal disease: Secondary | ICD-10-CM | POA: Diagnosis not present

## 2017-06-16 DIAGNOSIS — N186 End stage renal disease: Secondary | ICD-10-CM | POA: Diagnosis not present

## 2017-06-17 ENCOUNTER — Telehealth (INDEPENDENT_AMBULATORY_CARE_PROVIDER_SITE_OTHER): Payer: Self-pay

## 2017-06-17 DIAGNOSIS — N186 End stage renal disease: Secondary | ICD-10-CM | POA: Diagnosis not present

## 2017-06-17 DIAGNOSIS — D631 Anemia in chronic kidney disease: Secondary | ICD-10-CM | POA: Diagnosis not present

## 2017-06-17 DIAGNOSIS — D509 Iron deficiency anemia, unspecified: Secondary | ICD-10-CM | POA: Diagnosis not present

## 2017-06-17 DIAGNOSIS — R110 Nausea: Secondary | ICD-10-CM | POA: Diagnosis not present

## 2017-06-17 DIAGNOSIS — Z992 Dependence on renal dialysis: Secondary | ICD-10-CM | POA: Diagnosis not present

## 2017-06-17 DIAGNOSIS — R1110 Vomiting, unspecified: Secondary | ICD-10-CM | POA: Diagnosis not present

## 2017-06-17 NOTE — Telephone Encounter (Signed)
Patient called stating he has burning just below the incision and on top of his wrist. He started bleeding a small amount one day ago and he is right handed and his access is in his right hand. He was given Hydrocodone/Tylenol 5/325 and in two hours took 3 tablets, patient wondered if he should get something stronger. Per Dr. Delana Meyer this is typical post op and should get better in a few days, also nothing stronger will be given for pain management. Patient was informed of this and stated he has a prescription from 3 months ago and he can fill that if he needs to because he was told it will be good up to 6 months. Patient had a wrist fistula placement on 06/14/17.

## 2017-06-18 DIAGNOSIS — N186 End stage renal disease: Secondary | ICD-10-CM | POA: Diagnosis not present

## 2017-06-18 DIAGNOSIS — Z48812 Encounter for surgical aftercare following surgery on the circulatory system: Secondary | ICD-10-CM | POA: Diagnosis not present

## 2017-06-18 DIAGNOSIS — Z992 Dependence on renal dialysis: Secondary | ICD-10-CM | POA: Diagnosis not present

## 2017-06-18 DIAGNOSIS — Z794 Long term (current) use of insulin: Secondary | ICD-10-CM | POA: Diagnosis not present

## 2017-06-18 DIAGNOSIS — I120 Hypertensive chronic kidney disease with stage 5 chronic kidney disease or end stage renal disease: Secondary | ICD-10-CM | POA: Diagnosis not present

## 2017-06-18 DIAGNOSIS — E1122 Type 2 diabetes mellitus with diabetic chronic kidney disease: Secondary | ICD-10-CM | POA: Diagnosis not present

## 2017-06-19 ENCOUNTER — Telehealth (INDEPENDENT_AMBULATORY_CARE_PROVIDER_SITE_OTHER): Payer: Self-pay

## 2017-06-19 DIAGNOSIS — D509 Iron deficiency anemia, unspecified: Secondary | ICD-10-CM | POA: Diagnosis not present

## 2017-06-19 DIAGNOSIS — R110 Nausea: Secondary | ICD-10-CM | POA: Diagnosis not present

## 2017-06-19 DIAGNOSIS — Z992 Dependence on renal dialysis: Secondary | ICD-10-CM | POA: Diagnosis not present

## 2017-06-19 DIAGNOSIS — D631 Anemia in chronic kidney disease: Secondary | ICD-10-CM | POA: Diagnosis not present

## 2017-06-19 DIAGNOSIS — N186 End stage renal disease: Secondary | ICD-10-CM | POA: Diagnosis not present

## 2017-06-19 DIAGNOSIS — R1110 Vomiting, unspecified: Secondary | ICD-10-CM | POA: Diagnosis not present

## 2017-06-19 NOTE — Telephone Encounter (Signed)
Patient called stating that he was told by his Nephrologist that he had bruising where he had his fistula placed on Friday so he called because now it is purplish to dark blue, he wants to know if this is normal. 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 I explained that generally when you have surgery you will have some bruising so this is normal. He is 6 days out from having surgery.

## 2017-06-19 NOTE — Telephone Encounter (Signed)
ERROR

## 2017-06-21 DIAGNOSIS — D631 Anemia in chronic kidney disease: Secondary | ICD-10-CM | POA: Diagnosis not present

## 2017-06-21 DIAGNOSIS — R110 Nausea: Secondary | ICD-10-CM | POA: Diagnosis not present

## 2017-06-21 DIAGNOSIS — D509 Iron deficiency anemia, unspecified: Secondary | ICD-10-CM | POA: Diagnosis not present

## 2017-06-21 DIAGNOSIS — Z992 Dependence on renal dialysis: Secondary | ICD-10-CM | POA: Diagnosis not present

## 2017-06-21 DIAGNOSIS — N186 End stage renal disease: Secondary | ICD-10-CM | POA: Diagnosis not present

## 2017-06-21 DIAGNOSIS — R1110 Vomiting, unspecified: Secondary | ICD-10-CM | POA: Diagnosis not present

## 2017-06-24 DIAGNOSIS — D509 Iron deficiency anemia, unspecified: Secondary | ICD-10-CM | POA: Diagnosis not present

## 2017-06-24 DIAGNOSIS — Z992 Dependence on renal dialysis: Secondary | ICD-10-CM | POA: Diagnosis not present

## 2017-06-24 DIAGNOSIS — D631 Anemia in chronic kidney disease: Secondary | ICD-10-CM | POA: Diagnosis not present

## 2017-06-24 DIAGNOSIS — R1110 Vomiting, unspecified: Secondary | ICD-10-CM | POA: Diagnosis not present

## 2017-06-24 DIAGNOSIS — N186 End stage renal disease: Secondary | ICD-10-CM | POA: Diagnosis not present

## 2017-06-24 DIAGNOSIS — R110 Nausea: Secondary | ICD-10-CM | POA: Diagnosis not present

## 2017-06-26 DIAGNOSIS — Z992 Dependence on renal dialysis: Secondary | ICD-10-CM | POA: Diagnosis not present

## 2017-06-26 DIAGNOSIS — D509 Iron deficiency anemia, unspecified: Secondary | ICD-10-CM | POA: Diagnosis not present

## 2017-06-26 DIAGNOSIS — R1110 Vomiting, unspecified: Secondary | ICD-10-CM | POA: Diagnosis not present

## 2017-06-26 DIAGNOSIS — R110 Nausea: Secondary | ICD-10-CM | POA: Diagnosis not present

## 2017-06-26 DIAGNOSIS — N186 End stage renal disease: Secondary | ICD-10-CM | POA: Diagnosis not present

## 2017-06-26 DIAGNOSIS — D631 Anemia in chronic kidney disease: Secondary | ICD-10-CM | POA: Diagnosis not present

## 2017-06-28 DIAGNOSIS — N186 End stage renal disease: Secondary | ICD-10-CM | POA: Diagnosis not present

## 2017-06-28 DIAGNOSIS — R1110 Vomiting, unspecified: Secondary | ICD-10-CM | POA: Diagnosis not present

## 2017-06-28 DIAGNOSIS — R110 Nausea: Secondary | ICD-10-CM | POA: Diagnosis not present

## 2017-06-28 DIAGNOSIS — Z992 Dependence on renal dialysis: Secondary | ICD-10-CM | POA: Diagnosis not present

## 2017-06-28 DIAGNOSIS — D509 Iron deficiency anemia, unspecified: Secondary | ICD-10-CM | POA: Diagnosis not present

## 2017-06-28 DIAGNOSIS — D631 Anemia in chronic kidney disease: Secondary | ICD-10-CM | POA: Diagnosis not present

## 2017-07-01 DIAGNOSIS — D509 Iron deficiency anemia, unspecified: Secondary | ICD-10-CM | POA: Diagnosis not present

## 2017-07-01 DIAGNOSIS — R1110 Vomiting, unspecified: Secondary | ICD-10-CM | POA: Diagnosis not present

## 2017-07-01 DIAGNOSIS — Z992 Dependence on renal dialysis: Secondary | ICD-10-CM | POA: Diagnosis not present

## 2017-07-01 DIAGNOSIS — N186 End stage renal disease: Secondary | ICD-10-CM | POA: Diagnosis not present

## 2017-07-01 DIAGNOSIS — R110 Nausea: Secondary | ICD-10-CM | POA: Diagnosis not present

## 2017-07-01 DIAGNOSIS — D631 Anemia in chronic kidney disease: Secondary | ICD-10-CM | POA: Diagnosis not present

## 2017-07-02 ENCOUNTER — Encounter (INDEPENDENT_AMBULATORY_CARE_PROVIDER_SITE_OTHER): Payer: Medicare Other | Admitting: Vascular Surgery

## 2017-07-03 DIAGNOSIS — R110 Nausea: Secondary | ICD-10-CM | POA: Diagnosis not present

## 2017-07-03 DIAGNOSIS — N186 End stage renal disease: Secondary | ICD-10-CM | POA: Diagnosis not present

## 2017-07-03 DIAGNOSIS — D631 Anemia in chronic kidney disease: Secondary | ICD-10-CM | POA: Diagnosis not present

## 2017-07-03 DIAGNOSIS — D509 Iron deficiency anemia, unspecified: Secondary | ICD-10-CM | POA: Diagnosis not present

## 2017-07-03 DIAGNOSIS — Z992 Dependence on renal dialysis: Secondary | ICD-10-CM | POA: Diagnosis not present

## 2017-07-03 DIAGNOSIS — R1110 Vomiting, unspecified: Secondary | ICD-10-CM | POA: Diagnosis not present

## 2017-07-05 DIAGNOSIS — R1110 Vomiting, unspecified: Secondary | ICD-10-CM | POA: Diagnosis not present

## 2017-07-05 DIAGNOSIS — D631 Anemia in chronic kidney disease: Secondary | ICD-10-CM | POA: Diagnosis not present

## 2017-07-05 DIAGNOSIS — N186 End stage renal disease: Secondary | ICD-10-CM | POA: Diagnosis not present

## 2017-07-05 DIAGNOSIS — R110 Nausea: Secondary | ICD-10-CM | POA: Diagnosis not present

## 2017-07-05 DIAGNOSIS — D509 Iron deficiency anemia, unspecified: Secondary | ICD-10-CM | POA: Diagnosis not present

## 2017-07-05 DIAGNOSIS — Z992 Dependence on renal dialysis: Secondary | ICD-10-CM | POA: Diagnosis not present

## 2017-07-07 DIAGNOSIS — N186 End stage renal disease: Secondary | ICD-10-CM | POA: Diagnosis not present

## 2017-07-07 DIAGNOSIS — Z992 Dependence on renal dialysis: Secondary | ICD-10-CM | POA: Diagnosis not present

## 2017-07-08 DIAGNOSIS — N186 End stage renal disease: Secondary | ICD-10-CM | POA: Diagnosis not present

## 2017-07-08 DIAGNOSIS — N2581 Secondary hyperparathyroidism of renal origin: Secondary | ICD-10-CM | POA: Diagnosis not present

## 2017-07-08 DIAGNOSIS — D509 Iron deficiency anemia, unspecified: Secondary | ICD-10-CM | POA: Diagnosis not present

## 2017-07-08 DIAGNOSIS — Z992 Dependence on renal dialysis: Secondary | ICD-10-CM | POA: Diagnosis not present

## 2017-07-08 DIAGNOSIS — Z23 Encounter for immunization: Secondary | ICD-10-CM | POA: Diagnosis not present

## 2017-07-08 DIAGNOSIS — D631 Anemia in chronic kidney disease: Secondary | ICD-10-CM | POA: Diagnosis not present

## 2017-07-09 ENCOUNTER — Encounter (INDEPENDENT_AMBULATORY_CARE_PROVIDER_SITE_OTHER): Payer: Medicare Other | Admitting: Vascular Surgery

## 2017-07-10 ENCOUNTER — Ambulatory Visit (INDEPENDENT_AMBULATORY_CARE_PROVIDER_SITE_OTHER): Payer: Medicare Other | Admitting: Vascular Surgery

## 2017-07-10 ENCOUNTER — Encounter (INDEPENDENT_AMBULATORY_CARE_PROVIDER_SITE_OTHER): Payer: Self-pay | Admitting: Vascular Surgery

## 2017-07-10 VITALS — BP 92/62 | HR 86 | Resp 16 | Ht 70.5 in | Wt 300.0 lb

## 2017-07-10 DIAGNOSIS — E669 Obesity, unspecified: Secondary | ICD-10-CM

## 2017-07-10 DIAGNOSIS — E1169 Type 2 diabetes mellitus with other specified complication: Secondary | ICD-10-CM

## 2017-07-10 DIAGNOSIS — Z992 Dependence on renal dialysis: Secondary | ICD-10-CM | POA: Diagnosis not present

## 2017-07-10 DIAGNOSIS — E785 Hyperlipidemia, unspecified: Secondary | ICD-10-CM

## 2017-07-10 DIAGNOSIS — D509 Iron deficiency anemia, unspecified: Secondary | ICD-10-CM | POA: Diagnosis not present

## 2017-07-10 DIAGNOSIS — N2581 Secondary hyperparathyroidism of renal origin: Secondary | ICD-10-CM | POA: Diagnosis not present

## 2017-07-10 DIAGNOSIS — N186 End stage renal disease: Secondary | ICD-10-CM

## 2017-07-10 DIAGNOSIS — Z23 Encounter for immunization: Secondary | ICD-10-CM | POA: Diagnosis not present

## 2017-07-10 DIAGNOSIS — D631 Anemia in chronic kidney disease: Secondary | ICD-10-CM | POA: Diagnosis not present

## 2017-07-10 NOTE — Progress Notes (Signed)
Subjective:    Patient ID: Steven [name], male    DOB: 06-Aug-1961, 56 y.o.   MRN: [id number] Chief Complaint  Patient presents with  . Routine Post Op    2wk follow up   Patient presents for his first postoperative follow-up. Patient is status post a right radiocephalic AV fistula creation on 06/11/2017. Patient is currently being maintained by a right IJ PermCath without issue. Patient's postoperative course has been unremarkable. He presents today without complaint. He denies any fever, nausea or vomiting. He denies any right hand pain. Denies any issues with his incision.   Review of Systems  Constitutional: Negative.   HENT: Negative.   Eyes: Negative.   Respiratory: Negative.   Cardiovascular: Negative.   Gastrointestinal: Negative.   Endocrine: Negative.   Genitourinary: Negative.   Musculoskeletal: Negative.   Skin: Negative.   Allergic/Immunologic: Negative.   Neurological: Negative.   Hematological: Negative.   Psychiatric/Behavioral: Negative.       Objective:   Physical Exam  Constitutional: He is oriented to person, place, and time. He appears well-developed and well-nourished. No distress.  HENT:  Head: Normocephalic and atraumatic.  Eyes: Pupils are equal, round, and reactive to light. Conjunctivae are normal.  Cardiovascular: Normal rate, regular rhythm, normal heart sounds and intact distal pulses.   Pulses:      Radial pulses are 2+ on the right side, and 2+ on the left side.  right radiocephalic AV fistula: incision is healing well. Good bruit and thrill noted on exam.  Pulmonary/Chest: Effort normal.  Musculoskeletal: Normal range of motion. He exhibits no edema.  Neurological: He is alert and oriented to person, place, and time.  Skin: Skin is warm and dry. He is not diaphoretic.  Psychiatric: He has a normal mood and affect. His behavior is normal. Judgment and thought content normal.  Vitals reviewed.  BP 92/62 (BP Location: Left Arm)   Pulse  86   Resp 16   Ht 5' 10.5" (1.791 m)   Wt 300 lb (136.1 kg)   BMI 42.44 kg/m   Past Medical History:  Diagnosis Date  . Anemia   . Cellulitis and abscess of right lower extremity 03/01/2017  . Chronic kidney disease    dialysis m,w,f  . Diabetes mellitus without complication ([place])   . Edema extremities 03/01/2017   bilateral swelling  . GERD (gastroesophageal reflux disease)   . High cholesterol   . High triglycerides   . Hypertension   . Neuropathy   . Sleep apnea    can't use cpap d/t feelings of suffocation   Social History   Social History  . Marital status: Divorced    Spouse name: N/A  . Number of children: N/A  . Years of education: N/A   Occupational History  . Not on file.   Social History Main Topics  . Smoking status: Never Smoker  . Smokeless tobacco: Current User    Types: Snuff  . Alcohol use Yes     Comment: up until 01/2017 was heavy drinker...4 40 oz qd  . Drug use: No  . Sexual activity: No   Other Topics Concern  . Not on file   Social History Narrative  . No narrative on file   Past Surgical History:  Procedure Laterality Date  . APPENDECTOMY  2010  . AV FISTULA PLACEMENT Right 06/14/2017   Procedure: ARTERIOVENOUS (AV) FISTULA CREATION WRIST;  Surgeon: Katha Cabal, MD;  Location: [facility];  Service: Vascular;  Laterality: Right;  .  DIALYSIS/PERMA CATHETER INSERTION N/A 03/05/2017   Procedure: Dialysis/Perma Catheter Insertion;  Surgeon: Katha Cabal, MD;  Location: [place] [facility];  Service: Cardiovascular;  Laterality: N/A;  . EXCHANGE OF A DIALYSIS CATHETER  03/29/2017   Procedure: Exchange Of A Dialysis Catheter;  Surgeon: Katha Cabal, MD;  Location: [place] [facility];  Service: Cardiovascular;;  . IRRIGATION AND DEBRIDEMENT FOOT Right 03/01/2017   Procedure: IRRIGATION AND DEBRIDEMENT FOOT;  Surgeon: Albertine Patricia, DPM;  Location: [facility];  Service: Podiatry;  Laterality: Right;  application of wound vac    Family History  Problem Relation Age of Onset  . CAD Father    Allergies  Allergen Reactions  . Codeine Nausea And Vomiting      Assessment & Plan:  Patient presents for his first postoperative follow-up. Patient is status post a right radiocephalic AV fistula creation on 06/11/2017. Patient is currently being maintained by a right IJ PermCath without issue. Patient's postoperative course has been unremarkable. He presents today without complaint. He denies any fever, nausea or vomiting. He denies any right hand pain. Denies any issues with his incision.  1. End stage renal disease s/p radiocephalic AV fistula creation - New Patient has had an uncomplicated and unremarkable postoperative course. Incision is healing well. Patient with good bruit and thrill on examination. Patient currently being maintained but a right IJ PermCath without issue Patient can start dialyzing through his access on 07/15/2017 Patient was given a note stating this Will bring patient back for an HDA  2. Hyperlipidemia, unspecified hyperlipidemia type - Stable Encouraged good control as its slows the progression of atherosclerotic disease  3. Diabetes mellitus type 2 in obese ([facility]) - Stable Encouraged good control as its slows the progression of atherosclerotic disease  Current Outpatient Prescriptions on File Prior to Visit  Medication Sig Dispense Refill  . aspirin EC 81 MG tablet TAKE ONE  BY MOUTH EVERY DAY 30 tablet 10  . atorvastatin (LIPITOR) 40 MG tablet Take 40 mg by mouth daily.    . furosemide (LASIX) 80 MG tablet Take 1 tablet (80 mg total) by mouth daily. (Patient taking differently: Take 160 mg by mouth 2 (two) times daily. ) 30 tablet 0  . gabapentin (NEURONTIN) 300 MG capsule Take 1 capsule (300 mg total) by mouth 2 (two) times daily. 60 capsule 0  . glucose blood (ACCU-CHEK AVIVA) test strip Use as instructed.use to check blood sugar up to 3 times a day  Disp: 1 box 100 each 11  . insulin  aspart (NOVOLOG) 100 UNIT/ML injection Inject 4 Units into the skin 3 (three) times daily with meals. (Patient taking differently: Inject 30-60 Units into the skin 3 (three) times daily with meals. ) 10 mL 1  . insulin detemir (LEVEMIR) 100 UNIT/ML injection Inject 0.34 mLs (34 Units total) into the skin daily. (Patient taking differently: Inject 50-100 Units into the skin daily. ) 10 mL 1  . Insulin Pen Needle 31G X 8 MM MISC 1 Container by Does not apply route QID. qid--any brand 100 each 11  . Insulin Syringes, Disposable, U-100 1 ML MISC Use as directed with insulin 100 each 3  . Lancets (ACCU-CHEK MULTICLIX) lancets Use as instructed.  check blood sugar up to 3 times a day Disp:  1 box 100 each 11  . losartan (COZAAR) 50 MG tablet Take 1 tablet (50 mg total) by mouth daily. (Patient taking differently: Take 25 mg by mouth daily. ) 30 tablet 1  .  metoprolol tartrate (LOPRESSOR) 25 MG tablet Take 1 tablet (25 mg total) by mouth 2 (two) times daily. (Patient taking differently: Take 25 mg by mouth every evening. ) 60 tablet 0  . Multiple Vitamin (MULTIVITAMIN WITH MINERALS) TABS tablet Take 1 tablet by mouth daily.    Marland Kitchen oxymetazoline (AFRIN) 0.05 % nasal spray Place 1 spray into both nostrils daily as needed for congestion.    . ranitidine (ZANTAC) 150 MG tablet Take 150 mg by mouth 3 (three) times daily as needed for heartburn.     Marland Kitchen amLODipine (NORVASC) 5 MG tablet Take 1 tablet (5 mg total) by mouth daily. TAKE ONE TABLET BY MOUTH ONCE DAILY --  **NEEDS  VISIT  WITH  PRIMARY  CARE  PROVIDER  BEFORE  REFILLS  GIVEN** (Patient not taking: Reported on 05/21/2017) 30 tablet 0  . gabapentin (NEURONTIN) 400 MG capsule Take 400 mg by mouth 2 (two) times daily.    Marland Kitchen HYDROcodone-acetaminophen (NORCO) 5-325 MG tablet Take 1-2 tablets by mouth every 6 (six) hours as needed for moderate pain or severe pain. (Patient not taking: Reported on 07/10/2017) 30 tablet 0  . oxyCODONE-acetaminophen (PERCOCET/ROXICET)  5-325 MG tablet Take 2 tablets by mouth every 6 (six) hours as needed for moderate pain or severe pain. (Patient not taking: Reported on 05/21/2017) 30 tablet 0   No current facility-administered medications on file prior to visit.    There are no Patient Instructions on file for this visit. No Follow-up on file.  Luca Burston A Kenyon Eshleman, PA-C

## 2017-07-12 DIAGNOSIS — D631 Anemia in chronic kidney disease: Secondary | ICD-10-CM | POA: Diagnosis not present

## 2017-07-12 DIAGNOSIS — Z992 Dependence on renal dialysis: Secondary | ICD-10-CM | POA: Diagnosis not present

## 2017-07-12 DIAGNOSIS — N2581 Secondary hyperparathyroidism of renal origin: Secondary | ICD-10-CM | POA: Diagnosis not present

## 2017-07-12 DIAGNOSIS — N186 End stage renal disease: Secondary | ICD-10-CM | POA: Diagnosis not present

## 2017-07-12 DIAGNOSIS — Z23 Encounter for immunization: Secondary | ICD-10-CM | POA: Diagnosis not present

## 2017-07-12 DIAGNOSIS — D509 Iron deficiency anemia, unspecified: Secondary | ICD-10-CM | POA: Diagnosis not present

## 2017-07-15 DIAGNOSIS — N2581 Secondary hyperparathyroidism of renal origin: Secondary | ICD-10-CM | POA: Diagnosis not present

## 2017-07-15 DIAGNOSIS — D631 Anemia in chronic kidney disease: Secondary | ICD-10-CM | POA: Diagnosis not present

## 2017-07-15 DIAGNOSIS — Z992 Dependence on renal dialysis: Secondary | ICD-10-CM | POA: Diagnosis not present

## 2017-07-15 DIAGNOSIS — D509 Iron deficiency anemia, unspecified: Secondary | ICD-10-CM | POA: Diagnosis not present

## 2017-07-15 DIAGNOSIS — N186 End stage renal disease: Secondary | ICD-10-CM | POA: Diagnosis not present

## 2017-07-15 DIAGNOSIS — E119 Type 2 diabetes mellitus without complications: Secondary | ICD-10-CM | POA: Diagnosis not present

## 2017-07-15 DIAGNOSIS — Z23 Encounter for immunization: Secondary | ICD-10-CM | POA: Diagnosis not present

## 2017-07-15 DIAGNOSIS — Z794 Long term (current) use of insulin: Secondary | ICD-10-CM | POA: Diagnosis not present

## 2017-07-17 DIAGNOSIS — D631 Anemia in chronic kidney disease: Secondary | ICD-10-CM | POA: Diagnosis not present

## 2017-07-17 DIAGNOSIS — Z992 Dependence on renal dialysis: Secondary | ICD-10-CM | POA: Diagnosis not present

## 2017-07-17 DIAGNOSIS — N186 End stage renal disease: Secondary | ICD-10-CM | POA: Diagnosis not present

## 2017-07-17 DIAGNOSIS — Z23 Encounter for immunization: Secondary | ICD-10-CM | POA: Diagnosis not present

## 2017-07-17 DIAGNOSIS — N2581 Secondary hyperparathyroidism of renal origin: Secondary | ICD-10-CM | POA: Diagnosis not present

## 2017-07-17 DIAGNOSIS — D509 Iron deficiency anemia, unspecified: Secondary | ICD-10-CM | POA: Diagnosis not present

## 2017-07-20 DIAGNOSIS — Z992 Dependence on renal dialysis: Secondary | ICD-10-CM | POA: Diagnosis not present

## 2017-07-20 DIAGNOSIS — N186 End stage renal disease: Secondary | ICD-10-CM | POA: Diagnosis not present

## 2017-07-20 DIAGNOSIS — D631 Anemia in chronic kidney disease: Secondary | ICD-10-CM | POA: Diagnosis not present

## 2017-07-20 DIAGNOSIS — Z23 Encounter for immunization: Secondary | ICD-10-CM | POA: Diagnosis not present

## 2017-07-20 DIAGNOSIS — N2581 Secondary hyperparathyroidism of renal origin: Secondary | ICD-10-CM | POA: Diagnosis not present

## 2017-07-20 DIAGNOSIS — D509 Iron deficiency anemia, unspecified: Secondary | ICD-10-CM | POA: Diagnosis not present

## 2017-07-22 DIAGNOSIS — D509 Iron deficiency anemia, unspecified: Secondary | ICD-10-CM | POA: Diagnosis not present

## 2017-07-22 DIAGNOSIS — N186 End stage renal disease: Secondary | ICD-10-CM | POA: Diagnosis not present

## 2017-07-22 DIAGNOSIS — D631 Anemia in chronic kidney disease: Secondary | ICD-10-CM | POA: Diagnosis not present

## 2017-07-22 DIAGNOSIS — N2581 Secondary hyperparathyroidism of renal origin: Secondary | ICD-10-CM | POA: Diagnosis not present

## 2017-07-22 DIAGNOSIS — Z992 Dependence on renal dialysis: Secondary | ICD-10-CM | POA: Diagnosis not present

## 2017-07-22 DIAGNOSIS — Z23 Encounter for immunization: Secondary | ICD-10-CM | POA: Diagnosis not present

## 2017-07-24 DIAGNOSIS — N186 End stage renal disease: Secondary | ICD-10-CM | POA: Diagnosis not present

## 2017-07-24 DIAGNOSIS — Z23 Encounter for immunization: Secondary | ICD-10-CM | POA: Diagnosis not present

## 2017-07-24 DIAGNOSIS — Z992 Dependence on renal dialysis: Secondary | ICD-10-CM | POA: Diagnosis not present

## 2017-07-24 DIAGNOSIS — D631 Anemia in chronic kidney disease: Secondary | ICD-10-CM | POA: Diagnosis not present

## 2017-07-24 DIAGNOSIS — N2581 Secondary hyperparathyroidism of renal origin: Secondary | ICD-10-CM | POA: Diagnosis not present

## 2017-07-24 DIAGNOSIS — D509 Iron deficiency anemia, unspecified: Secondary | ICD-10-CM | POA: Diagnosis not present

## 2017-07-26 DIAGNOSIS — N2581 Secondary hyperparathyroidism of renal origin: Secondary | ICD-10-CM | POA: Diagnosis not present

## 2017-07-26 DIAGNOSIS — D509 Iron deficiency anemia, unspecified: Secondary | ICD-10-CM | POA: Diagnosis not present

## 2017-07-26 DIAGNOSIS — Z992 Dependence on renal dialysis: Secondary | ICD-10-CM | POA: Diagnosis not present

## 2017-07-26 DIAGNOSIS — D631 Anemia in chronic kidney disease: Secondary | ICD-10-CM | POA: Diagnosis not present

## 2017-07-26 DIAGNOSIS — N186 End stage renal disease: Secondary | ICD-10-CM | POA: Diagnosis not present

## 2017-07-26 DIAGNOSIS — Z23 Encounter for immunization: Secondary | ICD-10-CM | POA: Diagnosis not present

## 2017-07-29 DIAGNOSIS — D509 Iron deficiency anemia, unspecified: Secondary | ICD-10-CM | POA: Diagnosis not present

## 2017-07-29 DIAGNOSIS — N186 End stage renal disease: Secondary | ICD-10-CM | POA: Diagnosis not present

## 2017-07-29 DIAGNOSIS — N2581 Secondary hyperparathyroidism of renal origin: Secondary | ICD-10-CM | POA: Diagnosis not present

## 2017-07-29 DIAGNOSIS — D631 Anemia in chronic kidney disease: Secondary | ICD-10-CM | POA: Diagnosis not present

## 2017-07-29 DIAGNOSIS — Z23 Encounter for immunization: Secondary | ICD-10-CM | POA: Diagnosis not present

## 2017-07-29 DIAGNOSIS — Z992 Dependence on renal dialysis: Secondary | ICD-10-CM | POA: Diagnosis not present

## 2017-07-31 DIAGNOSIS — N186 End stage renal disease: Secondary | ICD-10-CM | POA: Diagnosis not present

## 2017-07-31 DIAGNOSIS — Z23 Encounter for immunization: Secondary | ICD-10-CM | POA: Diagnosis not present

## 2017-07-31 DIAGNOSIS — Z992 Dependence on renal dialysis: Secondary | ICD-10-CM | POA: Diagnosis not present

## 2017-07-31 DIAGNOSIS — N2581 Secondary hyperparathyroidism of renal origin: Secondary | ICD-10-CM | POA: Diagnosis not present

## 2017-07-31 DIAGNOSIS — D509 Iron deficiency anemia, unspecified: Secondary | ICD-10-CM | POA: Diagnosis not present

## 2017-07-31 DIAGNOSIS — D631 Anemia in chronic kidney disease: Secondary | ICD-10-CM | POA: Diagnosis not present

## 2017-08-02 DIAGNOSIS — D631 Anemia in chronic kidney disease: Secondary | ICD-10-CM | POA: Diagnosis not present

## 2017-08-02 DIAGNOSIS — Z23 Encounter for immunization: Secondary | ICD-10-CM | POA: Diagnosis not present

## 2017-08-02 DIAGNOSIS — D509 Iron deficiency anemia, unspecified: Secondary | ICD-10-CM | POA: Diagnosis not present

## 2017-08-02 DIAGNOSIS — Z992 Dependence on renal dialysis: Secondary | ICD-10-CM | POA: Diagnosis not present

## 2017-08-02 DIAGNOSIS — N186 End stage renal disease: Secondary | ICD-10-CM | POA: Diagnosis not present

## 2017-08-02 DIAGNOSIS — N2581 Secondary hyperparathyroidism of renal origin: Secondary | ICD-10-CM | POA: Diagnosis not present

## 2017-08-05 DIAGNOSIS — N186 End stage renal disease: Secondary | ICD-10-CM | POA: Diagnosis not present

## 2017-08-05 DIAGNOSIS — N2581 Secondary hyperparathyroidism of renal origin: Secondary | ICD-10-CM | POA: Diagnosis not present

## 2017-08-05 DIAGNOSIS — D509 Iron deficiency anemia, unspecified: Secondary | ICD-10-CM | POA: Diagnosis not present

## 2017-08-05 DIAGNOSIS — D631 Anemia in chronic kidney disease: Secondary | ICD-10-CM | POA: Diagnosis not present

## 2017-08-05 DIAGNOSIS — Z992 Dependence on renal dialysis: Secondary | ICD-10-CM | POA: Diagnosis not present

## 2017-08-05 DIAGNOSIS — Z23 Encounter for immunization: Secondary | ICD-10-CM | POA: Diagnosis not present

## 2017-08-07 DIAGNOSIS — D509 Iron deficiency anemia, unspecified: Secondary | ICD-10-CM | POA: Diagnosis not present

## 2017-08-07 DIAGNOSIS — Z23 Encounter for immunization: Secondary | ICD-10-CM | POA: Diagnosis not present

## 2017-08-07 DIAGNOSIS — N186 End stage renal disease: Secondary | ICD-10-CM | POA: Diagnosis not present

## 2017-08-07 DIAGNOSIS — D631 Anemia in chronic kidney disease: Secondary | ICD-10-CM | POA: Diagnosis not present

## 2017-08-07 DIAGNOSIS — Z992 Dependence on renal dialysis: Secondary | ICD-10-CM | POA: Diagnosis not present

## 2017-08-07 DIAGNOSIS — N2581 Secondary hyperparathyroidism of renal origin: Secondary | ICD-10-CM | POA: Diagnosis not present

## 2017-08-09 DIAGNOSIS — D509 Iron deficiency anemia, unspecified: Secondary | ICD-10-CM | POA: Diagnosis not present

## 2017-08-09 DIAGNOSIS — Z992 Dependence on renal dialysis: Secondary | ICD-10-CM | POA: Diagnosis not present

## 2017-08-09 DIAGNOSIS — N186 End stage renal disease: Secondary | ICD-10-CM | POA: Diagnosis not present

## 2017-08-13 ENCOUNTER — Encounter (INDEPENDENT_AMBULATORY_CARE_PROVIDER_SITE_OTHER): Payer: Self-pay

## 2017-08-13 DIAGNOSIS — N186 End stage renal disease: Secondary | ICD-10-CM | POA: Diagnosis not present

## 2017-08-13 DIAGNOSIS — D509 Iron deficiency anemia, unspecified: Secondary | ICD-10-CM | POA: Diagnosis not present

## 2017-08-13 DIAGNOSIS — Z992 Dependence on renal dialysis: Secondary | ICD-10-CM | POA: Diagnosis not present

## 2017-08-14 ENCOUNTER — Other Ambulatory Visit (INDEPENDENT_AMBULATORY_CARE_PROVIDER_SITE_OTHER): Payer: Self-pay | Admitting: Vascular Surgery

## 2017-08-14 DIAGNOSIS — D509 Iron deficiency anemia, unspecified: Secondary | ICD-10-CM | POA: Diagnosis not present

## 2017-08-14 DIAGNOSIS — Z992 Dependence on renal dialysis: Secondary | ICD-10-CM | POA: Diagnosis not present

## 2017-08-14 DIAGNOSIS — N186 End stage renal disease: Secondary | ICD-10-CM | POA: Diagnosis not present

## 2017-08-16 DIAGNOSIS — Z992 Dependence on renal dialysis: Secondary | ICD-10-CM | POA: Diagnosis not present

## 2017-08-16 DIAGNOSIS — N186 End stage renal disease: Secondary | ICD-10-CM | POA: Diagnosis not present

## 2017-08-16 DIAGNOSIS — D509 Iron deficiency anemia, unspecified: Secondary | ICD-10-CM | POA: Diagnosis not present

## 2017-08-20 DIAGNOSIS — D509 Iron deficiency anemia, unspecified: Secondary | ICD-10-CM | POA: Diagnosis not present

## 2017-08-20 DIAGNOSIS — Z992 Dependence on renal dialysis: Secondary | ICD-10-CM | POA: Diagnosis not present

## 2017-08-20 DIAGNOSIS — N186 End stage renal disease: Secondary | ICD-10-CM | POA: Diagnosis not present

## 2017-08-21 DIAGNOSIS — D509 Iron deficiency anemia, unspecified: Secondary | ICD-10-CM | POA: Diagnosis not present

## 2017-08-21 DIAGNOSIS — N186 End stage renal disease: Secondary | ICD-10-CM | POA: Diagnosis not present

## 2017-08-21 DIAGNOSIS — Z992 Dependence on renal dialysis: Secondary | ICD-10-CM | POA: Diagnosis not present

## 2017-08-22 ENCOUNTER — Encounter (INDEPENDENT_AMBULATORY_CARE_PROVIDER_SITE_OTHER): Payer: Medicare Other

## 2017-08-22 ENCOUNTER — Ambulatory Visit (INDEPENDENT_AMBULATORY_CARE_PROVIDER_SITE_OTHER): Payer: Medicare Other | Admitting: Vascular Surgery

## 2017-08-23 DIAGNOSIS — D509 Iron deficiency anemia, unspecified: Secondary | ICD-10-CM | POA: Diagnosis not present

## 2017-08-23 DIAGNOSIS — N186 End stage renal disease: Secondary | ICD-10-CM | POA: Diagnosis not present

## 2017-08-23 DIAGNOSIS — Z992 Dependence on renal dialysis: Secondary | ICD-10-CM | POA: Diagnosis not present

## 2017-08-26 DIAGNOSIS — D509 Iron deficiency anemia, unspecified: Secondary | ICD-10-CM | POA: Diagnosis not present

## 2017-08-26 DIAGNOSIS — N186 End stage renal disease: Secondary | ICD-10-CM | POA: Diagnosis not present

## 2017-08-26 DIAGNOSIS — Z992 Dependence on renal dialysis: Secondary | ICD-10-CM | POA: Diagnosis not present

## 2017-08-26 MED ORDER — CEFAZOLIN SODIUM-DEXTROSE 1-4 GM/50ML-% IV SOLN: 1.0000 g | Freq: Once | INTRAVENOUS | Status: DC

## 2017-08-27 ENCOUNTER — Encounter
Admission: RE | Disposition: A | Discharge status: Home / Self Care | Payer: Self-pay | Source: Ambulatory Visit | Attending: Vascular Surgery

## 2017-08-27 ENCOUNTER — Ambulatory Visit
Admission: RE | Admit: 2017-08-27 | Discharge: 2017-08-27 | Disposition: A | Discharge status: Home / Self Care | Payer: Medicare Other | Source: Ambulatory Visit | Attending: Vascular Surgery | Admitting: Vascular Surgery

## 2017-08-27 DIAGNOSIS — Z8249 Family history of ischemic heart disease and other diseases of the circulatory system: Secondary | ICD-10-CM | POA: Diagnosis not present

## 2017-08-27 DIAGNOSIS — K219 Gastro-esophageal reflux disease without esophagitis: Secondary | ICD-10-CM | POA: Diagnosis not present

## 2017-08-27 DIAGNOSIS — G4730 Sleep apnea, unspecified: Secondary | ICD-10-CM | POA: Insufficient documentation

## 2017-08-27 DIAGNOSIS — N186 End stage renal disease: Secondary | ICD-10-CM | POA: Insufficient documentation

## 2017-08-27 DIAGNOSIS — E7800 Pure hypercholesterolemia, unspecified: Secondary | ICD-10-CM | POA: Insufficient documentation

## 2017-08-27 DIAGNOSIS — E781 Pure hyperglyceridemia: Secondary | ICD-10-CM | POA: Insufficient documentation

## 2017-08-27 DIAGNOSIS — T82858A Stenosis of vascular prosthetic devices, implants and grafts, initial encounter: Secondary | ICD-10-CM | POA: Insufficient documentation

## 2017-08-27 DIAGNOSIS — E1122 Type 2 diabetes mellitus with diabetic chronic kidney disease: Secondary | ICD-10-CM | POA: Diagnosis not present

## 2017-08-27 DIAGNOSIS — Z992 Dependence on renal dialysis: Secondary | ICD-10-CM | POA: Diagnosis not present

## 2017-08-27 DIAGNOSIS — E1140 Type 2 diabetes mellitus with diabetic neuropathy, unspecified: Secondary | ICD-10-CM | POA: Insufficient documentation

## 2017-08-27 DIAGNOSIS — I120 Hypertensive chronic kidney disease with stage 5 chronic kidney disease or end stage renal disease: Secondary | ICD-10-CM | POA: Diagnosis not present

## 2017-08-27 DIAGNOSIS — Y832 Surgical operation with anastomosis, bypass or graft as the cause of abnormal reaction of the patient, or of later complication, without mention of misadventure at the time of the procedure: Secondary | ICD-10-CM | POA: Diagnosis not present

## 2017-08-27 DIAGNOSIS — T82868A Thrombosis of vascular prosthetic devices, implants and grafts, initial encounter: Secondary | ICD-10-CM | POA: Diagnosis not present

## 2017-08-27 HISTORY — PX: A/V FISTULAGRAM: CATH118298

## 2017-08-27 LAB — GLUCOSE, CAPILLARY
GLUCOSE-CAPILLARY: 157 mg/dL — AB (ref 65–99)
Glucose-Capillary: 181 mg/dL — ABNORMAL HIGH (ref 65–99)

## 2017-08-27 LAB — POTASSIUM (ARMC VASCULAR LAB ONLY): Potassium (ARMC vascular lab): 4.4 (ref 3.5–5.1)

## 2017-08-27 SURGERY — A/V FISTULAGRAM
Anesthesia: Moderate Sedation | Laterality: Right

## 2017-08-27 MED ORDER — FENTANYL CITRATE (PF) 100 MCG/2ML IJ SOLN
INTRAMUSCULAR | Status: AC
  Filled 2017-08-27: qty 2

## 2017-08-27 MED ORDER — MIDAZOLAM HCL 2 MG/2ML IJ SOLN
INTRAMUSCULAR | Status: DC | PRN
Start: 2017-08-27 — End: 2017-08-27
  Administered 2017-08-27: 1 mg via INTRAVENOUS
  Administered 2017-08-27 (×2): 2 mg via INTRAVENOUS

## 2017-08-27 MED ORDER — LIDOCAINE HCL (PF) 1 % IJ SOLN
INTRAMUSCULAR | Status: AC
  Filled 2017-08-27: qty 30

## 2017-08-27 MED ORDER — SODIUM CHLORIDE 0.9 % IV SOLN
INTRAVENOUS | Status: DC
  Administered 2017-08-27: 1000 mL via INTRAVENOUS

## 2017-08-27 MED ORDER — CEFAZOLIN SODIUM-DEXTROSE 2-4 GM/100ML-% IV SOLN
2.0000 g | Freq: Once | INTRAVENOUS | Status: AC
  Administered 2017-08-27: 2 g via INTRAVENOUS

## 2017-08-27 MED ORDER — HEPARIN SODIUM (PORCINE) 1000 UNIT/ML IJ SOLN
INTRAMUSCULAR | Status: DC | PRN
  Administered 2017-08-27: 3000 [IU] via INTRAVENOUS

## 2017-08-27 MED ORDER — MIDAZOLAM HCL 5 MG/5ML IJ SOLN
INTRAMUSCULAR | Status: AC
  Filled 2017-08-27: qty 5

## 2017-08-27 MED ORDER — METHYLPREDNISOLONE SODIUM SUCC 125 MG IJ SOLR: 125.0000 mg | INTRAMUSCULAR | Status: DC | PRN

## 2017-08-27 MED ORDER — ONDANSETRON HCL 4 MG/2ML IJ SOLN: 4.0000 mg | Freq: Four times a day (QID) | INTRAMUSCULAR | Status: DC | PRN

## 2017-08-27 MED ORDER — HEPARIN (PORCINE) IN NACL 2-0.9 UNIT/ML-% IJ SOLN
INTRAMUSCULAR | Status: AC
  Filled 2017-08-27: qty 1000

## 2017-08-27 MED ORDER — IOPAMIDOL (ISOVUE-300) INJECTION 61%
INTRAVENOUS | Status: DC | PRN
  Administered 2017-08-27: 50 mL via INTRAVENOUS

## 2017-08-27 MED ORDER — HYDROMORPHONE HCL 1 MG/ML IJ SOLN: 1.0000 mg | Freq: Once | INTRAMUSCULAR | Status: DC | PRN

## 2017-08-27 MED ORDER — HEPARIN SODIUM (PORCINE) 1000 UNIT/ML IJ SOLN
INTRAMUSCULAR | Status: AC
  Filled 2017-08-27: qty 1

## 2017-08-27 MED ORDER — LIDOCAINE HCL (PF) 1 % IJ SOLN
INTRAMUSCULAR | Status: DC | PRN
  Administered 2017-08-27: 10 mL via INTRADERMAL

## 2017-08-27 MED ORDER — FENTANYL CITRATE (PF) 100 MCG/2ML IJ SOLN
INTRAMUSCULAR | Status: DC | PRN
  Administered 2017-08-27 (×3): 50 ug via INTRAVENOUS

## 2017-08-27 MED ORDER — FAMOTIDINE 20 MG PO TABS: 40.0000 mg | ORAL_TABLET | ORAL | Status: DC | PRN

## 2017-08-27 SURGICAL SUPPLY — 19 items
BALLN LUTONIX DCB 4X40X130 (BALLOONS) ×3
BALLN LUTONIX DCB 6X40X130 (BALLOONS) ×3
BALLN ULTRV 018 3X40X75 (BALLOONS) ×3
BALLOON LUTONIX DCB 4X40X130 (BALLOONS) IMPLANT
BALLOON LUTONIX DCB 6X40X130 (BALLOONS) IMPLANT
BALLOON ULTRV 018 3X40X75 (BALLOONS) IMPLANT
CATH BEACON 5 .035 40 KMP TP (CATHETERS) IMPLANT
CATH BEACON 5 .038 40 KMP TP (CATHETERS) ×2
DEVICE PRESTO INFLATION (MISCELLANEOUS) ×2 IMPLANT
DRAPE BRACHIAL (DRAPES) ×2 IMPLANT
GUIDEWIRE ANGLED .035 180CM (WIRE) ×2 IMPLANT
NDL ENTRY 21GA 7CM ECHOTIP (NEEDLE) IMPLANT
NEEDLE ENTRY 21GA 7CM ECHOTIP (NEEDLE) ×3 IMPLANT
PACK ANGIOGRAPHY (CUSTOM PROCEDURE TRAY) ×3 IMPLANT
SET INTRO CAPELLA COAXIAL (SET/KITS/TRAYS/PACK) ×2 IMPLANT
SHEATH BRITE TIP 6FRX5.5 (SHEATH) ×2 IMPLANT
SHIELD RADPAD DADD DRAPE 4X9 (MISCELLANEOUS) ×2 IMPLANT
SUT MNCRL AB 4-0 PS2 18 (SUTURE) ×2 IMPLANT
WIRE G 018X200 V18 (WIRE) ×2 IMPLANT

## 2017-08-27 NOTE — H&P (Signed)
[facility] Admission History & Physical  MRN : [id number]  Steven [name] is a 56 y.o. (06/05/1961) male who presents with chief complaint of No chief complaint on file. Marland Kitchen  History of Present Illness: I am asked to evaluate the patient by the dialysis center. The patient was sent here because they were unable to achieve adequate dialysis. Furthermore the Center states there is very poor thrill and bruit. The patient states there there have been increasing problems with the access, such as "pulling clots" during dialysis and prolonged bleeding after decannulation. The patient estimates these problems have been going on for several weeks. The patient is unaware of any other change.  The patient currently dialyzes with a catheter when the fistula is not working well.  Fistula was created June 14, 2017.  It was cleared for use in mid October 2018.  Patient denies pain or tenderness overlying the access.  There is no pain with dialysis.  The patient denies hand pain or finger pain consistent with steal syndrome.   There have not been  any past interventions or declots of this access.  The patient is not chronically hypotensive on dialysis.  Current Facility-Administered Medications  Medication Dose Route Frequency Provider Last Rate Last Dose  . 0.9 %  sodium chloride infusion   Intravenous Continuous Stegmayer, Kimberly A, PA-C 10 mL/hr at 08/27/17 1151 1,000 mL at 08/27/17 1151  . ceFAZolin (ANCEF) IVPB 2g/100 mL premix  2 g Intravenous Once Consuello Lassalle, Dolores Lory, MD      . famotidine (PEPCID) tablet 40 mg  40 mg Oral PRN Stegmayer, Janalyn Harder, PA-C      . HYDROmorphone (DILAUDID) injection 1 mg  1 mg Intravenous Once PRN Stegmayer, Kimberly A, PA-C      . methylPREDNISolone sodium succinate (SOLU-MEDROL) 125 mg/2 mL injection 125 mg  125 mg Intravenous PRN Stegmayer, Kimberly A, PA-C      . ondansetron (ZOFRAN) injection 4 mg  4 mg Intravenous Q6H PRN Stegmayer,  Janalyn Harder, PA-C        Past Medical History:  Diagnosis Date  . Anemia   . Cellulitis and abscess of right lower extremity 03/01/2017  . Chronic kidney disease    dialysis m,w,f  . Diabetes mellitus without complication ([place])   . Edema extremities 03/01/2017   bilateral swelling  . GERD (gastroesophageal reflux disease)   . High cholesterol   . High triglycerides   . Hypertension   . Neuropathy   . Sleep apnea    can't use cpap d/t feelings of suffocation    Past Surgical History:  Procedure Laterality Date  . APPENDECTOMY  2010  . AV FISTULA PLACEMENT Right 06/14/2017   Procedure: ARTERIOVENOUS (AV) FISTULA CREATION WRIST;  Surgeon: Katha Cabal, MD;  Location: [facility];  Service: Vascular;  Laterality: Right;  . DIALYSIS/PERMA CATHETER INSERTION N/A 03/05/2017   Procedure: Dialysis/Perma Catheter Insertion;  Surgeon: Katha Cabal, MD;  Location: [facility];  Service: Cardiovascular;  Laterality: N/A;  . EXCHANGE OF A DIALYSIS CATHETER  03/29/2017   Procedure: Exchange Of A Dialysis Catheter;  Surgeon: Katha Cabal, MD;  Location: [facility];  Service: Cardiovascular;;  . IRRIGATION AND DEBRIDEMENT FOOT Right 03/01/2017   Procedure: IRRIGATION AND DEBRIDEMENT FOOT;  Surgeon: Albertine Patricia, DPM;  Location: [facility];  Service: Podiatry;  Laterality: Right;  application of wound vac    Social History Social History   Tobacco Use  . Smoking status:  Never Smoker  . Smokeless tobacco: Current User    Types: Snuff  Substance Use Topics  . 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 Drug use: No    Family History Family History  Problem Relation Age of Onset  . CAD Father     No family history of bleeding or clotting disorders, autoimmune disease or porphyria  Allergies  Allergen Reactions  . Codeine Nausea And Vomiting     REVIEW OF SYSTEMS (Negative unless checked)  Constitutional: [] Weight  loss  [] Fever  [] Chills Cardiac: [] Chest pain   [] Chest pressure   [] Palpitations   [] Shortness of breath when laying flat   [] Shortness of breath at rest   [x] Shortness of breath with exertion. Vascular:  [] Pain in legs with walking   [] Pain in legs at rest   [] Pain in legs when laying flat   [] Claudication   [] Pain in feet when walking  [] Pain in feet at rest  [] Pain in feet when laying flat   [] History of DVT   [] Phlebitis   [] Swelling in legs   [] Varicose veins   [] Non-healing ulcers Pulmonary:   [] Uses home oxygen   [] Productive cough   [] Hemoptysis   [] Wheeze  [] COPD   [] Asthma Neurologic:  [] Dizziness  [] Blackouts   [] Seizures   [] History of stroke   [] History of TIA  [] Aphasia   [] Temporary blindness   [] Dysphagia   [] Weakness or numbness in arms   [] Weakness or numbness in legs Musculoskeletal:  [] Arthritis   [] Joint swelling   [] Joint pain   [] Low back pain Hematologic:  [] Easy bruising  [] Easy bleeding   [] Hypercoagulable state   [] Anemic  [] Hepatitis Gastrointestinal:  [] Blood in stool   [] Vomiting blood  [] Gastroesophageal reflux/heartburn   [] Difficulty swallowing. Genitourinary:  [x] Chronic kidney disease   [] Difficult urination  [] Frequent urination  [] Burning with urination   [] Blood in urine Skin:  [] Rashes   [] Ulcers   [] Wounds Psychological:  [] History of anxiety   []  History of major depression.  Physical Examination  Vitals:   08/27/17 1135  BP: 136/77  Pulse: (!) 56  Resp: 19  Temp: 98.2 F (36.8 C)  TempSrc: Oral  SpO2: 95%  Weight: 300 lb (136.1 kg)  Height: 5\' 10"  (1.778 m)   Body mass index is 43.05 kg/m. Gen: WD/WN, NAD Head: [place]/AT, No temporalis wasting. Prominent temp pulse not noted. Ear/Nose/Throat: Hearing grossly intact, nares w/o erythema or drainage, oropharynx w/o Erythema/Exudate,  Eyes: Conjunctiva clear, sclera non-icteric Neck: Trachea midline.  No JVD.  Pulmonary:  Good air movement, respirations not labored, no use of accessory muscles.   Cardiac: RRR, normal S1, S2. Vascular: Left wrist AV fistula with thrill weak bruit; right IJ tunnel catheter present Vessel Right Left  Radial Palpable Palpable  Ulnar Not Palpable Not Palpable  Brachial Palpable Palpable  Carotid Palpable, without bruit Palpable, without bruit  Gastrointestinal: soft, non-tender/non-distended. No guarding/reflex.  Musculoskeletal: M/S 5/5 throughout.  Extremities without ischemic changes.  No deformity or atrophy.  Neurologic: Sensation grossly intact in extremities.  Symmetrical.  Speech is fluent. Motor exam as listed above. Psychiatric: Judgment intact, Mood & affect appropriate for pt's clinical situation. Dermatologic: No rashes or ulcers noted.  No cellulitis or open wounds. Lymph : No Cervical, Axillary, or Inguinal lymphadenopathy.   CBC Lab Results  Component Value Date   WBC 8.3 03/06/2017   HGB 13.6 06/14/2017   HCT 40.0 06/14/2017   MCV 86.2 03/06/2017  PLT 284 03/06/2017    BMET    Component Value Date/Time   NA 131 (L) 06/14/2017 0958   K 4.7 06/14/2017 0958   CL 98 (L) 05/23/2017 1103   CO2 26 05/23/2017 1103   GLUCOSE 263 (H) 06/14/2017 0958   BUN 42 (H) 05/23/2017 1103   CREATININE 5.72 (H) 05/23/2017 1103   CREATININE 1.41 (H) 12/07/2011 1030   CALCIUM 8.6 (L) 05/23/2017 1103   GFRNONAA 10 (L) 05/23/2017 1103   GFRAA 12 (L) 05/23/2017 1103   CrCl cannot be calculated (Patient's most recent lab result is older than the maximum 21 days allowed.).  COAG Lab Results  Component Value Date   INR 1.07 05/23/2017    Radiology No results found.  Assessment/Plan 1.  Complication dialysis device with thrombosis AV access:  Patient's left wrist dialysis access is malfunctioning. The patient will undergo angiography and correction of any problems using interventional techniques with the hope of restoring function to the access.  The risks and benefits were described to the patient.  All questions were answered.  The  patient agrees to proceed with angiography and intervention. Potassium will be drawn to ensure that it is an appropriate level prior to performing intervention. 2.  End-stage renal disease requiring hemodialysis:  Patient will continue dialysis therapy without further interruption. Dialysis has already been arranged. 3.  Hypertension:  Patient will continue medical management; nephrology is following no changes in oral medications. 4. Diabetes mellitus:  Glucose will be monitored and oral medications been held this morning once the patient has undergone the patient's procedure po intake will be reinitiated and again Accu-Cheks will be used to assess the blood glucose level and treat as needed. The patient will be restarted on the patient's usual hypoglycemic regime     Hortencia Pilar, MD  08/27/2017 12:04 PM

## 2017-08-27 NOTE — Op Note (Signed)
OPERATIVE NOTE   PROCEDURE: 1. Contrast injection right radiocephalic AV access 2. Percutaneous transluminal angioplasty anastomosis of the right radiocephalic fistula to 6 mm with a Lutonix drug-eluting balloon  PRE-OPERATIVE DIAGNOSIS: Complication of dialysis access                                                       End Stage Renal Disease  POST-OPERATIVE DIAGNOSIS: same as above   SURGEON: Katha Cabal, M.D.  ANESTHESIA: Conscious sedation was administered under my direct supervision by the interventional radiology RN. IV Versed plus fentanyl were utilized. Continuous ECG, pulse oximetry and blood pressure was monitored throughout the entire procedure.  Conscious sedation was for a total of 43 minutes.  ESTIMATED BLOOD LOSS: minimal  FINDING(S): Stricture of the AV graft  SPECIMEN(S):  None  CONTRAST: 15 cc  FLUOROSCOPY TIME: 2.4 minutes  INDICATIONS: Steven [name] is a 56 y.o. male who  presents with malfunctioning right wrist AV access.  The patient is scheduled for angiography with possible intervention of the AV access.  The patient is aware the risks include but are not limited to: bleeding, infection, thrombosis of the cannulated access, and possible anaphylactic reaction to the contrast.  The patient acknowledges if the access can not be salvaged a tunneled catheter will be needed and will be placed during this procedure.  The patient is aware of the risks of the procedure and elects to proceed with the angiogram and intervention.  DESCRIPTION: After full informed written consent was obtained, the patient was brought back to the Special Procedure suite and placed supine position.  Appropriate cardiopulmonary monitors were placed.  The right hand and arm was prepped and draped in the standard fashion.  Appropriate timeout is called. The right radiocephalic fistula was cannulated with a micropuncture needle.  Cannulation was performed with ultrasound guidance.  Ultrasound was placed in a sterile sleeve, the AV access was interrogated and noted to be echolucent and compressible indicating patency. Image was recorded for the permanent record. The puncture is performed under continuous ultrasound visualization.  Cannulation was performed in a retrograde direction selecting a site in the proximal forearm.   The microwire was advanced and the needle was exchanged for  a microsheath.  The J-wire was then advanced and a 6 Fr sheath inserted.  Hand injections were completed to image the access from the arterial anastomosis through the entire access.  The central venous structures were also imaged by hand injections.  Based on the images,  3000 units of heparin was given and a wire was negotiated through the strictures within the venous portion of the graft.  Serial dilatation of the arterial anastomosis was performed.  An 3 mm x 40 mm Ultraverse balloon was used initially.  Inflation was to 14 atm.  Subsequently a 4 mm by 40 mm Lutonix drug-eluting balloon was utilized and then a 6 mm x 40 mm Lutonix drug-eluting balloon was utilized.  Both inflations were to 6-10 atm for 1 full minute each  Follow-up imaging demonstrates complete resolution of the stricture with rapid flow of contrast through the fistula, the central venous anatomy is preserved.  A 4-0 Monocryl purse-string suture was sewn around the sheath.  The sheath was removed and light pressure was applied.  A sterile bandage was applied to the puncture site.  COMPLICATIONS: None  CONDITION: Steven [name], M.D [facility] Office: [phone]  08/27/2017 1:15 PM

## 2017-08-28 DIAGNOSIS — Z992 Dependence on renal dialysis: Secondary | ICD-10-CM | POA: Diagnosis not present

## 2017-08-28 DIAGNOSIS — N186 End stage renal disease: Secondary | ICD-10-CM | POA: Diagnosis not present

## 2017-08-28 DIAGNOSIS — D509 Iron deficiency anemia, unspecified: Secondary | ICD-10-CM | POA: Diagnosis not present

## 2017-08-30 DIAGNOSIS — Z992 Dependence on renal dialysis: Secondary | ICD-10-CM | POA: Diagnosis not present

## 2017-08-30 DIAGNOSIS — N186 End stage renal disease: Secondary | ICD-10-CM | POA: Diagnosis not present

## 2017-08-30 DIAGNOSIS — D509 Iron deficiency anemia, unspecified: Secondary | ICD-10-CM | POA: Diagnosis not present

## 2017-09-02 DIAGNOSIS — N186 End stage renal disease: Secondary | ICD-10-CM | POA: Diagnosis not present

## 2017-09-02 DIAGNOSIS — D509 Iron deficiency anemia, unspecified: Secondary | ICD-10-CM | POA: Diagnosis not present

## 2017-09-02 DIAGNOSIS — Z992 Dependence on renal dialysis: Secondary | ICD-10-CM | POA: Diagnosis not present

## 2017-09-04 DIAGNOSIS — Z992 Dependence on renal dialysis: Secondary | ICD-10-CM | POA: Diagnosis not present

## 2017-09-04 DIAGNOSIS — N186 End stage renal disease: Secondary | ICD-10-CM | POA: Diagnosis not present

## 2017-09-04 DIAGNOSIS — D509 Iron deficiency anemia, unspecified: Secondary | ICD-10-CM | POA: Diagnosis not present

## 2017-09-06 DIAGNOSIS — Z992 Dependence on renal dialysis: Secondary | ICD-10-CM | POA: Diagnosis not present

## 2017-09-06 DIAGNOSIS — D509 Iron deficiency anemia, unspecified: Secondary | ICD-10-CM | POA: Diagnosis not present

## 2017-09-06 DIAGNOSIS — N186 End stage renal disease: Secondary | ICD-10-CM | POA: Diagnosis not present

## 2017-09-09 DIAGNOSIS — D509 Iron deficiency anemia, unspecified: Secondary | ICD-10-CM | POA: Diagnosis not present

## 2017-09-09 DIAGNOSIS — Z992 Dependence on renal dialysis: Secondary | ICD-10-CM | POA: Diagnosis not present

## 2017-09-09 DIAGNOSIS — D631 Anemia in chronic kidney disease: Secondary | ICD-10-CM | POA: Diagnosis not present

## 2017-09-09 DIAGNOSIS — Z23 Encounter for immunization: Secondary | ICD-10-CM | POA: Diagnosis not present

## 2017-09-09 DIAGNOSIS — N186 End stage renal disease: Secondary | ICD-10-CM | POA: Diagnosis not present

## 2017-09-11 ENCOUNTER — Encounter (INDEPENDENT_AMBULATORY_CARE_PROVIDER_SITE_OTHER): Payer: Medicare Other

## 2017-09-11 ENCOUNTER — Ambulatory Visit (INDEPENDENT_AMBULATORY_CARE_PROVIDER_SITE_OTHER): Payer: Medicare Other | Admitting: Vascular Surgery

## 2017-09-13 DIAGNOSIS — D631 Anemia in chronic kidney disease: Secondary | ICD-10-CM | POA: Diagnosis not present

## 2017-09-13 DIAGNOSIS — D509 Iron deficiency anemia, unspecified: Secondary | ICD-10-CM | POA: Diagnosis not present

## 2017-09-13 DIAGNOSIS — Z992 Dependence on renal dialysis: Secondary | ICD-10-CM | POA: Diagnosis not present

## 2017-09-13 DIAGNOSIS — Z23 Encounter for immunization: Secondary | ICD-10-CM | POA: Diagnosis not present

## 2017-09-13 DIAGNOSIS — N186 End stage renal disease: Secondary | ICD-10-CM | POA: Diagnosis not present

## 2017-09-18 ENCOUNTER — Other Ambulatory Visit (INDEPENDENT_AMBULATORY_CARE_PROVIDER_SITE_OTHER): Payer: Self-pay | Admitting: Vascular Surgery

## 2017-09-18 ENCOUNTER — Inpatient Hospital Stay
Admission: RE | Admit: 2017-09-18 | Discharge: 2017-09-20 | DRG: 314 | Disposition: A | Discharge status: Home / Self Care | Payer: Medicare Other | Source: Ambulatory Visit | Attending: Internal Medicine | Admitting: Internal Medicine

## 2017-09-18 ENCOUNTER — Inpatient Hospital Stay: Payer: Medicare Other

## 2017-09-18 ENCOUNTER — Other Ambulatory Visit: Payer: Self-pay

## 2017-09-18 ENCOUNTER — Encounter
Admission: RE | Disposition: A | Discharge status: Home / Self Care | Payer: Self-pay | Source: Ambulatory Visit | Attending: Internal Medicine

## 2017-09-18 DIAGNOSIS — Z794 Long term (current) use of insulin: Secondary | ICD-10-CM

## 2017-09-18 DIAGNOSIS — Z720 Tobacco use: Secondary | ICD-10-CM | POA: Diagnosis not present

## 2017-09-18 DIAGNOSIS — Z9119 Patient's noncompliance with other medical treatment and regimen: Secondary | ICD-10-CM | POA: Diagnosis not present

## 2017-09-18 DIAGNOSIS — R739 Hyperglycemia, unspecified: Secondary | ICD-10-CM | POA: Diagnosis not present

## 2017-09-18 DIAGNOSIS — E1122 Type 2 diabetes mellitus with diabetic chronic kidney disease: Secondary | ICD-10-CM | POA: Diagnosis present

## 2017-09-18 DIAGNOSIS — J9601 Acute respiratory failure with hypoxia: Secondary | ICD-10-CM | POA: Diagnosis not present

## 2017-09-18 DIAGNOSIS — E1310 Other specified diabetes mellitus with ketoacidosis without coma: Secondary | ICD-10-CM | POA: Diagnosis not present

## 2017-09-18 DIAGNOSIS — E875 Hyperkalemia: Secondary | ICD-10-CM | POA: Diagnosis not present

## 2017-09-18 DIAGNOSIS — E876 Hypokalemia: Secondary | ICD-10-CM | POA: Diagnosis present

## 2017-09-18 DIAGNOSIS — E119 Type 2 diabetes mellitus without complications: Secondary | ICD-10-CM | POA: Diagnosis not present

## 2017-09-18 DIAGNOSIS — E1140 Type 2 diabetes mellitus with diabetic neuropathy, unspecified: Secondary | ICD-10-CM | POA: Diagnosis present

## 2017-09-18 DIAGNOSIS — K219 Gastro-esophageal reflux disease without esophagitis: Secondary | ICD-10-CM | POA: Diagnosis present

## 2017-09-18 DIAGNOSIS — Z9115 Patient's noncompliance with renal dialysis: Secondary | ICD-10-CM

## 2017-09-18 DIAGNOSIS — T8249XA Other complication of vascular dialysis catheter, initial encounter: Principal | ICD-10-CM | POA: Diagnosis present

## 2017-09-18 DIAGNOSIS — G4730 Sleep apnea, unspecified: Secondary | ICD-10-CM | POA: Diagnosis present

## 2017-09-18 DIAGNOSIS — Z992 Dependence on renal dialysis: Secondary | ICD-10-CM | POA: Diagnosis not present

## 2017-09-18 DIAGNOSIS — E1110 Type 2 diabetes mellitus with ketoacidosis without coma: Secondary | ICD-10-CM | POA: Diagnosis present

## 2017-09-18 DIAGNOSIS — Z79899 Other long term (current) drug therapy: Secondary | ICD-10-CM

## 2017-09-18 DIAGNOSIS — T82868A Thrombosis of vascular prosthetic devices, implants and grafts, initial encounter: Secondary | ICD-10-CM | POA: Diagnosis not present

## 2017-09-18 DIAGNOSIS — Z6841 Body Mass Index (BMI) 40.0 and over, adult: Secondary | ICD-10-CM

## 2017-09-18 DIAGNOSIS — Y841 Kidney dialysis as the cause of abnormal reaction of the patient, or of later complication, without mention of misadventure at the time of the procedure: Secondary | ICD-10-CM | POA: Diagnosis present

## 2017-09-18 DIAGNOSIS — I10 Essential (primary) hypertension: Secondary | ICD-10-CM | POA: Diagnosis not present

## 2017-09-18 DIAGNOSIS — Z7982 Long term (current) use of aspirin: Secondary | ICD-10-CM

## 2017-09-18 DIAGNOSIS — E7800 Pure hypercholesterolemia, unspecified: Secondary | ICD-10-CM | POA: Diagnosis present

## 2017-09-18 DIAGNOSIS — N186 End stage renal disease: Secondary | ICD-10-CM | POA: Diagnosis not present

## 2017-09-18 DIAGNOSIS — E8770 Fluid overload, unspecified: Secondary | ICD-10-CM | POA: Diagnosis not present

## 2017-09-18 DIAGNOSIS — E785 Hyperlipidemia, unspecified: Secondary | ICD-10-CM | POA: Diagnosis not present

## 2017-09-18 DIAGNOSIS — N2581 Secondary hyperparathyroidism of renal origin: Secondary | ICD-10-CM | POA: Diagnosis present

## 2017-09-18 DIAGNOSIS — I120 Hypertensive chronic kidney disease with stage 5 chronic kidney disease or end stage renal disease: Secondary | ICD-10-CM | POA: Diagnosis not present

## 2017-09-18 DIAGNOSIS — D631 Anemia in chronic kidney disease: Secondary | ICD-10-CM | POA: Diagnosis present

## 2017-09-18 DIAGNOSIS — R0602 Shortness of breath: Secondary | ICD-10-CM | POA: Diagnosis not present

## 2017-09-18 DIAGNOSIS — G5730 Lesion of lateral popliteal nerve, unspecified lower limb: Secondary | ICD-10-CM | POA: Diagnosis not present

## 2017-09-18 DIAGNOSIS — T829XXA Unspecified complication of cardiac and vascular prosthetic device, implant and graft, initial encounter: Secondary | ICD-10-CM | POA: Diagnosis not present

## 2017-09-18 LAB — BASIC METABOLIC PANEL
ANION GAP: 20 — AB (ref 5–15)
ANION GAP: 20 — AB (ref 5–15)
ANION GAP: 17 — AB (ref 5–15)
BUN: 93 mg/dL — ABNORMAL HIGH (ref 6–20)
BUN: 94 mg/dL — AB (ref 6–20)
BUN: 91 mg/dL — ABNORMAL HIGH (ref 6–20)
CALCIUM: 8.9 mg/dL (ref 8.9–10.3)
CALCIUM: 8.9 mg/dL (ref 8.9–10.3)
CHLORIDE: 89 mmol/L — AB (ref 101–111)
CO2: 21 mmol/L — AB (ref 22–32)
CO2: 20 mmol/L — AB (ref 22–32)
CO2: 24 mmol/L (ref 22–32)
Calcium: 9.1 mg/dL (ref 8.9–10.3)
Chloride: 90 mmol/L — ABNORMAL LOW (ref 101–111)
Chloride: 94 mmol/L — ABNORMAL LOW (ref 101–111)
Creatinine, Ser: 9.92 mg/dL — ABNORMAL HIGH (ref 0.61–1.24)
Creatinine, Ser: 9.97 mg/dL — ABNORMAL HIGH (ref 0.61–1.24)
Creatinine, Ser: 10.11 mg/dL — ABNORMAL HIGH (ref 0.61–1.24)
GFR calc Af Amer: 6 mL/min — ABNORMAL LOW (ref >60)
GFR calc Af Amer: 6 mL/min — ABNORMAL LOW (ref >60)
GFR calc non Af Amer: 5 mL/min — ABNORMAL LOW (ref >60)
GFR calc non Af Amer: 5 mL/min — ABNORMAL LOW (ref >60)
GFR calc non Af Amer: 5 mL/min — ABNORMAL LOW (ref >60)
GFR, EST AFRICAN AMERICAN: 6 mL/min — AB (ref >60)
GLUCOSE: 707 mg/dL — AB (ref 65–99)
GLUCOSE: 316 mg/dL — AB (ref 65–99)
Glucose, Bld: 716 mg/dL (ref 65–99)
POTASSIUM: 5.9 mmol/L — AB (ref 3.5–5.1)
POTASSIUM: 4.1 mmol/L (ref 3.5–5.1)
Potassium: 6 mmol/L — ABNORMAL HIGH (ref 3.5–5.1)
Sodium: 130 mmol/L — ABNORMAL LOW (ref 135–145)
Sodium: 130 mmol/L — ABNORMAL LOW (ref 135–145)
Sodium: 135 mmol/L (ref 135–145)

## 2017-09-18 LAB — CBC
HEMATOCRIT: 41.8 % (ref 40.0–52.0)
HEMOGLOBIN: 13.6 g/dL (ref 13.0–18.0)
MCH: 31.9 pg (ref 26.0–34.0)
MCHC: 32.5 g/dL (ref 32.0–36.0)
MCV: 98 fL (ref 80.0–100.0)
Platelets: 203 10*3/uL (ref 150–440)
RBC: 4.26 MIL/uL — AB (ref 4.40–5.90)
RDW: 15.2 % — ABNORMAL HIGH (ref 11.5–14.5)
WBC: 5.8 10*3/uL (ref 3.8–10.6)

## 2017-09-18 LAB — GLUCOSE, CAPILLARY
GLUCOSE-CAPILLARY: 548 mg/dL — AB (ref 65–99)
GLUCOSE-CAPILLARY: 324 mg/dL — AB (ref 65–99)
Glucose-Capillary: 129 mg/dL — ABNORMAL HIGH (ref 65–99)
Glucose-Capillary: 135 mg/dL — ABNORMAL HIGH (ref 65–99)
Glucose-Capillary: 182 mg/dL — ABNORMAL HIGH (ref 65–99)
Glucose-Capillary: 416 mg/dL — ABNORMAL HIGH (ref 65–99)
Glucose-Capillary: 503 mg/dL (ref 65–99)
Glucose-Capillary: >600 mg/dL (ref 65–99)

## 2017-09-18 LAB — MAGNESIUM: Magnesium: 2.6 mg/dL — ABNORMAL HIGH (ref 1.7–2.4)

## 2017-09-18 LAB — PHOSPHORUS
PHOSPHORUS: 6.5 mg/dL — AB (ref 2.5–4.6)
Phosphorus: 7.3 mg/dL — ABNORMAL HIGH (ref 2.5–4.6)

## 2017-09-18 SURGERY — DIALYSIS/PERMA CATHETER INSERTION
Anesthesia: Moderate Sedation

## 2017-09-18 MED ORDER — MIDAZOLAM HCL 2 MG/ML PO SYRP: 10.0000 mg | ORAL_SOLUTION | Freq: Once | ORAL | Status: DC

## 2017-09-18 MED ORDER — HEPARIN SODIUM (PORCINE) 5000 UNIT/ML IJ SOLN
5000.0000 [IU] | Freq: Three times a day (TID) | INTRAMUSCULAR | Status: DC
  Administered 2017-09-18 – 2017-09-19 (×4): 5000 [IU] via SUBCUTANEOUS
  Filled 2017-09-18 (×4): qty 1

## 2017-09-18 MED ORDER — ACETAMINOPHEN 325 MG PO TABS: 650.0000 mg | ORAL_TABLET | Freq: Four times a day (QID) | ORAL | Status: DC | PRN

## 2017-09-18 MED ORDER — ONDANSETRON HCL 4 MG/2ML IJ SOLN: 4.0000 mg | Freq: Four times a day (QID) | INTRAMUSCULAR | Status: DC | PRN

## 2017-09-18 MED ORDER — CEFAZOLIN SODIUM-DEXTROSE 1-4 GM/50ML-% IV SOLN: 1.0000 g | Freq: Once | INTRAVENOUS | Status: DC

## 2017-09-18 MED ORDER — PENTAFLUOROPROP-TETRAFLUOROETH EX AERO
1.0000 "application " | INHALATION_SPRAY | CUTANEOUS | Status: DC | PRN
  Filled 2017-09-18: qty 30

## 2017-09-18 MED ORDER — SODIUM CHLORIDE 0.9 % IV SOLN: INTRAVENOUS | Status: AC

## 2017-09-18 MED ORDER — DEXTROSE-NACL 5-0.45 % IV SOLN
INTRAVENOUS | Status: DC
  Administered 2017-09-18: 22:00:00 via INTRAVENOUS

## 2017-09-18 MED ORDER — FAMOTIDINE 20 MG PO TABS: 40.0000 mg | ORAL_TABLET | ORAL | Status: DC | PRN

## 2017-09-18 MED ORDER — HEPARIN SODIUM (PORCINE) 1000 UNIT/ML DIALYSIS
1000.0000 [IU] | INTRAMUSCULAR | Status: DC | PRN
  Filled 2017-09-18: qty 1

## 2017-09-18 MED ORDER — DOCUSATE SODIUM 100 MG PO CAPS
100.0000 mg | ORAL_CAPSULE | Freq: Two times a day (BID) | ORAL | Status: DC
  Administered 2017-09-18 – 2017-09-19 (×2): 100 mg via ORAL
  Filled 2017-09-18 (×4): qty 1

## 2017-09-18 MED ORDER — SODIUM CHLORIDE 0.9% FLUSH: 3.0000 mL | INTRAVENOUS | Status: DC | PRN

## 2017-09-18 MED ORDER — LIDOCAINE-PRILOCAINE 2.5-2.5 % EX CREA
1.0000 "application " | TOPICAL_CREAM | CUTANEOUS | Status: DC | PRN
  Filled 2017-09-18: qty 5

## 2017-09-18 MED ORDER — OMEPRAZOLE MAGNESIUM 20 MG PO TBEC: 20.0000 mg | DELAYED_RELEASE_TABLET | ORAL | Status: DC

## 2017-09-18 MED ORDER — SODIUM CHLORIDE 0.9 % IV SOLN
250.0000 mL | INTRAVENOUS | Status: DC | PRN
  Administered 2017-09-20: 09:00:00 via INTRAVENOUS

## 2017-09-18 MED ORDER — HYDRALAZINE HCL 20 MG/ML IJ SOLN
10.0000 mg | Freq: Four times a day (QID) | INTRAMUSCULAR | Status: DC | PRN
  Administered 2017-09-19: 10 mg via INTRAVENOUS
  Filled 2017-09-18: qty 1

## 2017-09-18 MED ORDER — LOSARTAN POTASSIUM 25 MG PO TABS
25.0000 mg | ORAL_TABLET | Freq: Every day | ORAL | Status: DC
  Filled 2017-09-18: qty 1

## 2017-09-18 MED ORDER — ASPIRIN EC 81 MG PO TBEC
81.0000 mg | DELAYED_RELEASE_TABLET | Freq: Every day | ORAL | Status: DC
  Administered 2017-09-18 – 2017-09-19 (×2): 81 mg via ORAL
  Filled 2017-09-18 (×3): qty 1

## 2017-09-18 MED ORDER — ALBUTEROL SULFATE (2.5 MG/3ML) 0.083% IN NEBU: 2.5000 mg | INHALATION_SOLUTION | RESPIRATORY_TRACT | Status: DC | PRN

## 2017-09-18 MED ORDER — HYDROMORPHONE HCL 1 MG/ML IJ SOLN: 1.0000 mg | Freq: Once | INTRAMUSCULAR | Status: DC | PRN

## 2017-09-18 MED ORDER — METHYLPREDNISOLONE SODIUM SUCC 125 MG IJ SOLR: 125.0000 mg | INTRAMUSCULAR | Status: DC | PRN

## 2017-09-18 MED ORDER — METOPROLOL TARTRATE 25 MG PO TABS
25.0000 mg | ORAL_TABLET | Freq: Two times a day (BID) | ORAL | Status: DC
  Administered 2017-09-19: 25 mg via ORAL
  Filled 2017-09-18 (×2): qty 1

## 2017-09-18 MED ORDER — ACARBOSE 50 MG PO TABS
100.0000 mg | ORAL_TABLET | Freq: Three times a day (TID) | ORAL | Status: DC
  Filled 2017-09-18: qty 1

## 2017-09-18 MED ORDER — ALTEPLASE 2 MG IJ SOLR
2.0000 mg | Freq: Once | INTRAMUSCULAR | Status: AC | PRN
  Administered 2017-09-18: 4 mg
  Filled 2017-09-18 (×2): qty 2

## 2017-09-18 MED ORDER — SODIUM CHLORIDE 0.9 % IV SOLN: 100.0000 mL | INTRAVENOUS | Status: DC | PRN

## 2017-09-18 MED ORDER — FUROSEMIDE 80 MG PO TABS
160.0000 mg | ORAL_TABLET | Freq: Two times a day (BID) | ORAL | Status: DC
  Administered 2017-09-18 – 2017-09-19 (×3): 160 mg via ORAL
  Filled 2017-09-18: qty 4
  Filled 2017-09-18 (×2): qty 2
  Filled 2017-09-18 (×2): qty 8
  Filled 2017-09-18 (×3): qty 2

## 2017-09-18 MED ORDER — PANTOPRAZOLE SODIUM 40 MG PO TBEC
40.0000 mg | DELAYED_RELEASE_TABLET | Freq: Every day | ORAL | Status: DC
  Administered 2017-09-18 – 2017-09-19 (×2): 40 mg via ORAL
  Filled 2017-09-18 (×3): qty 1

## 2017-09-18 MED ORDER — OXYMETAZOLINE HCL 0.05 % NA SOLN
1.0000 | Freq: Every day | NASAL | Status: DC | PRN
  Filled 2017-09-18: qty 15

## 2017-09-18 MED ORDER — SODIUM CHLORIDE 0.9 % IV SOLN
INTRAVENOUS | Status: DC
  Administered 2017-09-18: 5.4 [IU]/h via INTRAVENOUS
  Administered 2017-09-19: 5.7 [IU]/h via INTRAVENOUS
  Administered 2017-09-19: via INTRAVENOUS
  Filled 2017-09-18 (×2): qty 1

## 2017-09-18 MED ORDER — GABAPENTIN 300 MG PO CAPS
300.0000 mg | ORAL_CAPSULE | Freq: Two times a day (BID) | ORAL | Status: DC
  Administered 2017-09-18 – 2017-09-19 (×4): 300 mg via ORAL
  Filled 2017-09-18 (×5): qty 1

## 2017-09-18 MED ORDER — SODIUM CHLORIDE 0.9% FLUSH
3.0000 mL | Freq: Two times a day (BID) | INTRAVENOUS | Status: DC
  Administered 2017-09-18 – 2017-09-19 (×2): 3 mL via INTRAVENOUS

## 2017-09-18 MED ORDER — STERILE WATER FOR INJECTION IJ SOLN
INTRAMUSCULAR | Status: AC
  Administered 2017-09-19: 01:00:00
  Filled 2017-09-18: qty 10

## 2017-09-18 MED ORDER — SENNOSIDES-DOCUSATE SODIUM 8.6-50 MG PO TABS: 1.0000 | ORAL_TABLET | Freq: Every evening | ORAL | Status: DC | PRN

## 2017-09-18 MED ORDER — HYDROCODONE-ACETAMINOPHEN 5-325 MG PO TABS: 1.0000 | ORAL_TABLET | Freq: Four times a day (QID) | ORAL | Status: DC | PRN

## 2017-09-18 MED ORDER — ACETAMINOPHEN 650 MG RE SUPP: 650.0000 mg | Freq: Four times a day (QID) | RECTAL | Status: DC | PRN

## 2017-09-18 MED ORDER — SODIUM CHLORIDE 0.9 % IV SOLN
INTRAVENOUS | Status: DC
  Administered 2017-09-18: 16:00:00 via INTRAVENOUS

## 2017-09-18 MED ORDER — LIDOCAINE HCL (PF) 1 % IJ SOLN
5.0000 mL | INTRAMUSCULAR | Status: DC | PRN
  Filled 2017-09-18: qty 5

## 2017-09-18 MED ORDER — SODIUM CHLORIDE 0.9 % IV SOLN: INTRAVENOUS | Status: DC

## 2017-09-18 NOTE — Progress Notes (Signed)
Patient alert and oriented no complaints of pain. Steven [name] notified of patients labs, potassium, blood sugars, blood pressure. Hydralazine ordered for blood pressure greater then 406 systolic. Insulin drip infusing, metoprolol held. Consents signed for dialysis.

## 2017-09-18 NOTE — Progress Notes (Signed)
HD tx end  

## 2017-09-18 NOTE — Progress Notes (Signed)
Patient to [facility] from home today for scheduled procedure. Initially did not have ride home post-procedure, but contacted sister who agreed to come at discharge to review instructions and drive patient home. Discussed driving and sedation restrictions with patient and sister, and both acknowledge. Patient's blood glucose critically high on glucometer, MD notified and lab work ordered to confirm glucose as well as additional labs. Labs pending. Patient alert and oriented, reporting no pain. IV placed. Resting quietly at this time.

## 2017-09-18 NOTE — Progress Notes (Signed)
Both V-and A- lines are clotted, cathflo administered.

## 2017-09-18 NOTE — Consult Note (Signed)
PULMONARY / CRITICAL CARE MEDICINE   Name: Steven [name] MRN: [id number] DOB: [birth year]    ADMISSION DATE:  09/18/2017   CONSULTATION DATE:  09/18/2017  REFERRING MD:  Dr. Bridgett Larsson  REASON: DKA  HISTORY OF PRESENT ILLNESS: 56 year old male with a past medical history as indicated below who presented to the hospital for a Vas-Cath replacement.  He was found to be severely hyperglycemic and hypokalemic.  He is being admitted to the ICU and insulin infusion.  He is due for hemodialysis.  PAST MEDICAL HISTORY :  He  has a past medical history of Anemia, Cellulitis and abscess of right lower extremity (03/01/2017), Chronic kidney disease, Diabetes mellitus without complication ([place]), Edema extremities (03/01/2017), GERD (gastroesophageal reflux disease), High cholesterol, High triglycerides, Hypertension, Neuropathy, and Sleep apnea.  PAST SURGICAL HISTORY: He  has a past surgical history that includes Appendectomy (2010); Irrigation and debridement foot (Right, 03/01/2017); DIALYSIS/PERMA CATHETER INSERTION (N/A, 03/05/2017); Exchange of a dialysis catheter (03/29/2017); AV fistula placement (Right, 06/14/2017); and A/V Fistulagram (Right, 08/27/2017).  Allergies  Allergen Reactions  . Codeine Nausea And Vomiting    No current facility-administered medications on file prior to encounter.    Current Outpatient Medications on File Prior to Encounter  Medication Sig  . acarbose (PRECOSE) 100 MG tablet Take 100 mg 3 (three) times daily with meals by mouth.  Marland Kitchen aspirin EC 81 MG tablet TAKE ONE  BY MOUTH EVERY DAY  . furosemide (LASIX) 80 MG tablet Take 1 tablet (80 mg total) by mouth daily. (Patient taking differently: Take 160 mg by mouth 2 (two) times daily. )  . gabapentin (NEURONTIN) 300 MG capsule Take 1 capsule (300 mg total) by mouth 2 (two) times daily.  Marland Kitchen HYDROcodone-acetaminophen (NORCO) 5-325 MG tablet Take 1-2 tablets by mouth every 6 (six) hours as needed for moderate pain or severe  pain.  Marland Kitchen insulin aspart (NOVOLOG) 100 UNIT/ML injection Inject 4 Units into the skin 3 (three) times daily with meals. (Patient taking differently: Inject 25-100 Units 3 (three) times daily with meals into the skin. )  . insulin detemir (LEVEMIR) 100 UNIT/ML injection Inject 0.34 mLs (34 Units total) into the skin daily. (Patient taking differently: Inject 60 Units daily into the skin. )  . Insulin Pen Needle 31G X 8 MM MISC 1 Container by Does not apply route QID. qid--any brand  . losartan (COZAAR) 25 MG tablet Take 25 mg daily by mouth.  . metoprolol tartrate (LOPRESSOR) 25 MG tablet Take 1 tablet (25 mg total) by mouth 2 (two) times daily.  . Multiple Vitamin (MULTIVITAMIN WITH MINERALS) TABS tablet Take 1 tablet by mouth daily.  Marland Kitchen omeprazole (PRILOSEC OTC) 20 MG tablet Take 20 mg every other day by mouth.  Marland Kitchen oxymetazoline (AFRIN) 0.05 % nasal spray Place 1 spray into both nostrils daily as needed for congestion.  Marland Kitchen glucose blood (ACCU-CHEK AVIVA) test strip Use as instructed.use to check blood sugar up to 3 times a day  Disp: 1 box  . Insulin Syringes, Disposable, U-100 1 ML MISC Use as directed with insulin  . Lancets (ACCU-CHEK MULTICLIX) lancets Use as instructed.  check blood sugar up to 3 times a day Disp:  1 box    FAMILY HISTORY:  His indicated that the status of his father is unknown.   SOCIAL HISTORY: He  reports that  has never smoked. His smokeless tobacco use includes snuff. He reports that he drinks alcohol. He reports that he does not use drugs.  REVIEW OF  SYSTEMS:   Constitutional: Negative for fever and chills but reports generalized weakness HENT: Negative for congestion and rhinorrhea but reports dry mouth.  Eyes: Negative for redness and visual disturbance.  Respiratory: Negative for shortness of breath and wheezing.  Cardiovascular: Negative for chest pain and palpitations.  Gastrointestinal: Negative  for nausea , vomiting and abdominal pain and  Loose  stools Genitourinary: Negative for dysuria and urgency.  Endocrine: Reports polyuria, polyphagia and polydipsia Musculoskeletal: Negative for myalgias and arthralgias.  Skin: Negative for pallor and wound.  Neurological: Negative for dizziness and headaches  SUBJECTIVE:   VITAL SIGNS: BP (!) 174/67   Pulse (!) 56   Temp 98.3 F (36.8 C)   Resp 18   Ht 5\' 10"  (1.778 m)   Wt 132.6 kg (292 lb 5.3 oz)   SpO2 95%   BMI 41.94 kg/m   HEMODYNAMICS:    VENTILATOR SETTINGS: FiO2 (%):  [35 %-40 %] 35 %  INTAKE / OUTPUT: No intake/output data recorded.  PHYSICAL EXAMINATION: General: Appears acutely ill Neuro: Alert and oriented x3, cranial nerves intact HEENT: PERRLA, neck is supple with good range of motion, mild JVD Cardiovascular: Apical pulse regular, S1-S2, no murmur regurg or gallop, +2 pulses, +3 pitting edema in right lower extremity trace edema in left lower extremity Lungs:  Bilateral breath sounds, diminished in the bases, no wheeze Abdomen: Soft, nontender, nondistended, normal bowel sounds Musculoskeletal: No deformities Skin: Warm and dry  LABS:  BMET Recent Labs  Lab 09/18/17 1205 09/18/17 1512  NA 130* 130*  K 6.0* 5.9*  CL 89* 90*  CO2 21* 20*  BUN 93* 94*  CREATININE 9.92* 9.97*  GLUCOSE 716* 707*    Electrolytes Recent Labs  Lab 09/18/17 1205 09/18/17 1512  CALCIUM 8.9 8.9  MG  --  2.6*  PHOS  --  7.3*    CBC Recent Labs  Lab 09/18/17 1205  WBC 5.8  HGB 13.6  HCT 41.8  PLT 203    Coag's No results for input(s): APTT, INR in the last 168 hours.  Sepsis Markers No results for input(s): LATICACIDVEN, PROCALCITON, O2SATVEN in the last 168 hours.  ABG No results for input(s): PHART, PCO2ART, PO2ART in the last 168 hours.  Liver Enzymes No results for input(s): AST, ALT, ALKPHOS, BILITOT, ALBUMIN in the last 168 hours.  Cardiac Enzymes No results for input(s): TROPONINI, PROBNP in the last 168 hours.  Glucose Recent Labs   Lab 09/18/17 1159  GLUCAP >600*    Imaging Portable Chest X-ray (1 View)  Result Date: 09/18/2017 CLINICAL DATA:  Shortness of breath. EXAM: PORTABLE CHEST 1 VIEW COMPARISON:  Radiograph of March 29, 2017 FINDINGS: The heart size and mediastinal contours are within normal limits. Both lungs are clear. Right internal jugular dialysis catheter is noted with distal tip in expected position of cavoatrial junction. No pneumothorax or pleural effusion is noted. The visualized skeletal structures are unremarkable. IMPRESSION: No acute cardiopulmonary abnormality seen. Electronically Signed   By: Marijo Conception, M.D.   On: 09/18/2017 14:12   SIGNIFICANT EVENTS: 09/18/2017: Admitted  LINES/TUBES: Peripheral IVs  DISCUSSION: 55 year old male presenting with hyperglycemia/DKA secondary to uncontrolled type 2 diabetes and hypokalemia  ASSESSMENT  DKA Hyper kalemia Uncontrolled type 2 diabetes Uncontrolled hypertension End-stage renal disease on hemodialysis Chronic pain PLAN Hemodynamics per ICU DKA protocol Continue all home blood pressure medications As needed hydralazine to keep systolic blood pressure less than 160 Check hemoglobin A1c Gentle IV hydration Hemodialysis per nephrology As needed pain  medications  GI and DVT prophylaxis FAMILY  - Updates: No family at bedside  - Inter-disciplinary family meet or Palliative Care meeting due by:  day Platter. [facility] ANP-BC Pulmonary and Critical Care Medicine [facility] Pager [phone] or [phone]  09/18/2017, 4:32 PM

## 2017-09-18 NOTE — Progress Notes (Signed)
Pre HD assessment  

## 2017-09-18 NOTE — Progress Notes (Signed)
Post HD assessment  

## 2017-09-18 NOTE — Progress Notes (Signed)
Report given to [facility] in ICU. Patient alert and oriented. Reporting no pain. Dr Bridgett Larsson saw patient, portable chest xray and ECG completed in [facility]. Will transfer to ICU room shortly.

## 2017-09-18 NOTE — Progress Notes (Signed)
Critical blood glucose communicated to Dr Delana Meyer along with potassium value. MD acknowledged and will contact hospitalist.

## 2017-09-18 NOTE — H&P (Addendum)
[facility] [place] NAME: Steven [name]    MR#:  [id number]  DATE OF BIRTH:  06-Feb-1961  DATE OF ADMISSION:  09/18/2017  PRIMARY CARE PHYSICIAN: [name], MD   REQUESTING/REFERRING PHYSICIAN: Schnier, Dolores Lory, MD  CHIEF COMPLAINT:  No chief complaint on file.  Hypoglycemia and hyperkalemia. HISTORY OF PRESENT ILLNESS:  Steven [name]  is a 56 y.o. male with a known history of ESRD on hemodialysis, diabetes, hypertension, hyperlipidemia, sleep apnea and neuropathy.  The patient was scheduled to have dialysis access procedure due to inadequate dialysis.  He missed hemodialysis for the past few days.  The patient was found hypoglycemia with blood sugar at 751, AG 20, and hyperkalemia at 6.0.  He was also found hypoxia with O2 saturation decreased to 80s, he was put on Ventimask in procedure room.  The patient only complains of several days of cough and shortness of breath.  He denies any other symptoms.  PAST MEDICAL HISTORY:   Past Medical History:  Diagnosis Date  . Anemia   . Cellulitis and abscess of right lower extremity 03/01/2017  . Chronic kidney disease    dialysis m,w,f  . Diabetes mellitus without complication ([facility])   . Edema extremities 03/01/2017   bilateral swelling  . GERD (gastroesophageal reflux disease)   . High cholesterol   . High triglycerides   . Hypertension   . Neuropathy   . Sleep apnea    can't use cpap d/t feelings of suffocation    PAST SURGICAL HISTORY:   Past Surgical History:  Procedure Laterality Date  . A/V FISTULAGRAM Right 08/27/2017   Procedure: A/V FISTULAGRAM;  Surgeon: Katha Cabal, MD;  Location: [facility];  Service: Cardiovascular;  Laterality: Right;  . APPENDECTOMY  2010  . AV FISTULA PLACEMENT Right 06/14/2017   Procedure: ARTERIOVENOUS (AV) FISTULA CREATION WRIST;  Surgeon: Katha Cabal, MD;  Location: [facility];  Service: Vascular;  Laterality: Right;    . DIALYSIS/PERMA CATHETER INSERTION N/A 03/05/2017   Procedure: Dialysis/Perma Catheter Insertion;  Surgeon: Katha Cabal, MD;  Location: [facility];  Service: Cardiovascular;  Laterality: N/A;  . EXCHANGE OF A DIALYSIS CATHETER  03/29/2017   Procedure: Exchange Of A Dialysis Catheter;  Surgeon: Katha Cabal, MD;  Location: [facility];  Service: Cardiovascular;;  . IRRIGATION AND DEBRIDEMENT FOOT Right 03/01/2017   Procedure: IRRIGATION AND DEBRIDEMENT FOOT;  Surgeon: Albertine Patricia, DPM;  Location: [facility];  Service: Podiatry;  Laterality: Right;  application of wound vac    SOCIAL HISTORY:   Social History   Tobacco Use  . Smoking status: Never Smoker  . Smokeless tobacco: Current User    Types: Snuff  Substance Use Topics  . Alcohol use: Yes    Comment: up until 01/2017 was heavy drinker...4 40 oz qd    FAMILY HISTORY:   Family History  Problem Relation Age of Onset  . CAD Father     DRUG ALLERGIES:   Allergies  Allergen Reactions  . Codeine Nausea And Vomiting    REVIEW OF SYSTEMS:   Review of Systems  Constitutional: Positive for malaise/fatigue. Negative for chills and fever.  HENT: Negative for sore throat.   Eyes: Negative for blurred vision and double vision.  Respiratory: Positive for cough and shortness of breath. Negative for hemoptysis, wheezing and stridor.   Cardiovascular: Negative for chest pain, palpitations, orthopnea and leg swelling.  Gastrointestinal: Negative for abdominal  pain, blood in stool, diarrhea, melena, nausea and vomiting.  Genitourinary: Negative for dysuria, flank pain and hematuria.  Musculoskeletal: Negative for back pain and joint pain.  Neurological: Negative for dizziness, sensory change, focal weakness, seizures, loss of consciousness, weakness and headaches.  Endo/Heme/Allergies: Negative for polydipsia.  Psychiatric/Behavioral: Negative for depression. The patient is not nervous/anxious.      MEDICATIONS AT HOME:   Prior to Admission medications   Medication Sig Start Date End Date Taking? Authorizing Provider  acarbose (PRECOSE) 100 MG tablet Take 100 mg 3 (three) times daily with meals by mouth.   Yes [provider]  aspirin EC 81 MG tablet TAKE ONE  BY MOUTH EVERY DAY 09/15/12  Yes Alveda Reasons, MD  furosemide (LASIX) 80 MG tablet Take 1 tablet (80 mg total) by mouth daily. Patient taking differently: Take 160 mg by mouth 2 (two) times daily.  03/08/17  Yes Vaughan Basta, MD  gabapentin (NEURONTIN) 300 MG capsule Take 1 capsule (300 mg total) by mouth 2 (two) times daily. 03/07/17  Yes Vaughan Basta, MD  HYDROcodone-acetaminophen (NORCO) 5-325 MG tablet Take 1-2 tablets by mouth every 6 (six) hours as needed for moderate pain or severe pain. 06/14/17  Yes Schnier, Dolores Lory, MD  insulin aspart (NOVOLOG) 100 UNIT/ML injection Inject 4 Units into the skin 3 (three) times daily with meals. Patient taking differently: Inject 25-100 Units 3 (three) times daily with meals into the skin.  03/07/17  Yes Vaughan Basta, MD  insulin detemir (LEVEMIR) 100 UNIT/ML injection Inject 0.34 mLs (34 Units total) into the skin daily. Patient taking differently: Inject 60 Units daily into the skin.  03/07/17  Yes Vaughan Basta, MD  Insulin Pen Needle 31G X 8 MM MISC 1 Container by Does not apply route QID. qid--any brand 08/20/14  Yes Alveda Reasons, MD  losartan (COZAAR) 25 MG tablet Take 25 mg daily by mouth.   Yes [provider]  metoprolol tartrate (LOPRESSOR) 25 MG tablet Take 1 tablet (25 mg total) by mouth 2 (two) times daily. 03/07/17  Yes Vaughan Basta, MD  Multiple Vitamin (MULTIVITAMIN WITH MINERALS) TABS tablet Take 1 tablet by mouth daily.   Yes [provider]  omeprazole (PRILOSEC OTC) 20 MG tablet Take 20 mg every other day by mouth.   Yes [provider]  oxymetazoline (AFRIN) 0.05 % nasal spray  Place 1 spray into both nostrils daily as needed for congestion.   Yes [provider]  glucose blood (ACCU-CHEK AVIVA) test strip Use as instructed.use to check blood sugar up to 3 times a day  Disp: 1 box 08/20/14   Alveda Reasons, MD  Insulin Syringes, Disposable, U-100 1 ML MISC Use as directed with insulin 08/20/14   Alveda Reasons, MD  Lancets (ACCU-CHEK MULTICLIX) lancets Use as instructed.  check blood sugar up to 3 times a day Disp:  1 box 08/20/14   Alveda Reasons, MD      VITAL SIGNS:  Blood pressure (!) 180/79, pulse 61, temperature 98.1 F (36.7 C), temperature source Oral, resp. rate 15, height 5\' 10"  (1.778 m), weight 292 lb 5.3 oz (132.6 kg), SpO2 96 %.  PHYSICAL EXAMINATION:  Physical Exam  Constitutional: He is oriented to person, place, and time and well-developed, well-nourished, and in no distress.  HENT:  Head: Normocephalic.  Mouth/Throat: Oropharynx is clear and moist.  Eyes: Conjunctivae and EOM are normal. Pupils are equal, round, and reactive to light. No scleral icterus.  Neck: Normal  range of motion. Neck supple. No JVD present. No tracheal deviation present.  Cardiovascular: Normal rate, regular rhythm and normal heart sounds. Exam reveals no gallop.  No murmur heard. Pulmonary/Chest: Effort normal. No respiratory distress. He has no wheezes. He has rales.  Mild basilar rales.  Abdominal: Soft. Bowel sounds are normal. He exhibits no distension. There is no tenderness. There is no rebound.  Musculoskeletal: Normal range of motion. He exhibits edema. He exhibits no tenderness.  Bilateral leg edema.  Neurological: He is alert and oriented to person, place, and time. No cranial nerve deficit.  Skin: No rash noted. No erythema.  Psychiatric: Affect normal.    LABORATORY PANEL:   CBC Recent Labs  Lab 09/18/17 1205  WBC 5.8  HGB 13.6  HCT 41.8  PLT 203    ------------------------------------------------------------------------------------------------------------------  Chemistries  Recent Labs  Lab 09/18/17 1205  NA 130*  K 6.0*  CL 89*  CO2 21*  GLUCOSE 716*  BUN 93*  CREATININE 9.92*  CALCIUM 8.9   ------------------------------------------------------------------------------------------------------------------  Cardiac Enzymes No results for input(s): TROPONINI in the last 168 hours. ------------------------------------------------------------------------------------------------------------------  RADIOLOGY:  Portable Chest X-ray (1 View)  Result Date: 09/18/2017 CLINICAL DATA:  Shortness of breath. EXAM: PORTABLE CHEST 1 VIEW COMPARISON:  Radiograph of March 29, 2017 FINDINGS: The heart size and mediastinal contours are within normal limits. Both lungs are clear. Right internal jugular dialysis catheter is noted with distal tip in expected position of cavoatrial junction. No pneumothorax or pleural effusion is noted. The visualized skeletal structures are unremarkable. IMPRESSION: No acute cardiopulmonary abnormality seen. Electronically Signed   By: Marijo Conception, M.D.   On: 09/18/2017 14:12      IMPRESSION AND PLAN:   DKA with diabetes 2. The patient will be admitted to stepdown unit. Start insulin drip, follow-up BMP every 4 hours, follow DKA protocol. Hold patient home Levemir and NovoLog for now. N.p.o. with IV fluids support.  Acute respiratory failure with hypoxia due to fluid overload with missed hemodialysis,  ESRD. Try to wean off oxygen, and nebulizer as needed.   Nephrology consult to continue hemodialysis.  Hyperkalemia.  Start insulin drip and follow-up BMP. Pseudohyponatremia due to hyperglycemia.  Accelerated hypertension.  Continue home hypertension medication, IV hydralazine as needed.  Complication dialysis device with thrombosis AV access Follow-up Dr. Delana Meyer after the patient is  stable.   I discussed with Dr. Delana Meyer and Dr. Mortimer Fries. All the records are reviewed and case discussed with ED provider. Management plans discussed with the patient, family and they are in agreement.  CODE STATUS: Full code  TOTAL CRITICAL TIME TAKING CARE OF THIS PATIENT: 58 minutes.    Demetrios Loll M.D on 09/18/2017 at 2:31 PM  Between 7am to 6pm - Pager - [phone]  After 6pm go to [email or web address] - Proofreader  [facility]  Office  [phone]  CC: Primary care physician; [name], MD   Note: This dictation was prepared with Dragon dictation along with smaller phrase technology. Any transcriptional errors that result from this process are unin

## 2017-09-18 NOTE — Care Management (Signed)
RNCM contacted patient's PCP Dr. Sherrie Sport with [facility] internal medicine [phone] for followup appointment on 09/27/17 at 4:30PM. Patient was last seen at PCP in June 2018.  Estill Bamberg with [facility] (outpatient hemodialysis liaision) notified of patient's admission to ICU.

## 2017-09-18 NOTE — Progress Notes (Signed)
Per Nicki Reaper requested insulin drip. He called pharm.

## 2017-09-18 NOTE — Progress Notes (Signed)
HD tx start 

## 2017-09-19 DIAGNOSIS — T82868A Thrombosis of vascular prosthetic devices, implants and grafts, initial encounter: Secondary | ICD-10-CM

## 2017-09-19 DIAGNOSIS — E1110 Type 2 diabetes mellitus with ketoacidosis without coma: Secondary | ICD-10-CM

## 2017-09-19 DIAGNOSIS — E119 Type 2 diabetes mellitus without complications: Secondary | ICD-10-CM

## 2017-09-19 DIAGNOSIS — I10 Essential (primary) hypertension: Secondary | ICD-10-CM

## 2017-09-19 DIAGNOSIS — N186 End stage renal disease: Secondary | ICD-10-CM

## 2017-09-19 LAB — HEMOGLOBIN A1C
HEMOGLOBIN A1C: 9.7 % — AB (ref 4.8–5.6)
MEAN PLASMA GLUCOSE: 232 mg/dL

## 2017-09-19 LAB — GLUCOSE, CAPILLARY
GLUCOSE-CAPILLARY: 102 mg/dL — AB (ref 65–99)
GLUCOSE-CAPILLARY: 149 mg/dL — AB (ref 65–99)
GLUCOSE-CAPILLARY: 153 mg/dL — AB (ref 65–99)
GLUCOSE-CAPILLARY: 156 mg/dL — AB (ref 65–99)
GLUCOSE-CAPILLARY: 197 mg/dL — AB (ref 65–99)
GLUCOSE-CAPILLARY: 210 mg/dL — AB (ref 65–99)
GLUCOSE-CAPILLARY: 211 mg/dL — AB (ref 65–99)
GLUCOSE-CAPILLARY: 257 mg/dL — AB (ref 65–99)
GLUCOSE-CAPILLARY: 258 mg/dL — AB (ref 65–99)
GLUCOSE-CAPILLARY: 98 mg/dL (ref 65–99)
Glucose-Capillary: >600 mg/dL (ref 65–99)
Glucose-Capillary: 231 mg/dL — ABNORMAL HIGH (ref 65–99)
Glucose-Capillary: 119 mg/dL — ABNORMAL HIGH (ref 65–99)
Glucose-Capillary: 101 mg/dL — ABNORMAL HIGH (ref 65–99)

## 2017-09-19 LAB — BASIC METABOLIC PANEL
ANION GAP: 14 (ref 5–15)
Anion gap: 12 (ref 5–15)
BUN: 49 mg/dL — AB (ref 6–20)
BUN: 52 mg/dL — AB (ref 6–20)
CHLORIDE: 94 mmol/L — AB (ref 101–111)
CO2: 27 mmol/L (ref 22–32)
CO2: 30 mmol/L (ref 22–32)
Calcium: 8.6 mg/dL — ABNORMAL LOW (ref 8.9–10.3)
Calcium: 8.6 mg/dL — ABNORMAL LOW (ref 8.9–10.3)
Chloride: 95 mmol/L — ABNORMAL LOW (ref 101–111)
Creatinine, Ser: 6.4 mg/dL — ABNORMAL HIGH (ref 0.61–1.24)
Creatinine, Ser: 6.78 mg/dL — ABNORMAL HIGH (ref 0.61–1.24)
GFR calc Af Amer: 10 mL/min — ABNORMAL LOW (ref >60)
GFR, EST AFRICAN AMERICAN: 9 mL/min — AB (ref >60)
GFR, EST NON AFRICAN AMERICAN: 9 mL/min — AB (ref >60)
GFR, EST NON AFRICAN AMERICAN: 8 mL/min — AB (ref >60)
Glucose, Bld: 242 mg/dL — ABNORMAL HIGH (ref 65–99)
Glucose, Bld: 180 mg/dL — ABNORMAL HIGH (ref 65–99)
POTASSIUM: 3.7 mmol/L (ref 3.5–5.1)
POTASSIUM: 3.6 mmol/L (ref 3.5–5.1)
SODIUM: 135 mmol/L (ref 135–145)
SODIUM: 137 mmol/L (ref 135–145)

## 2017-09-19 LAB — CBC
HEMATOCRIT: 37.4 % — AB (ref 40.0–52.0)
HEMOGLOBIN: 12.7 g/dL — AB (ref 13.0–18.0)
MCH: 31.7 pg (ref 26.0–34.0)
MCHC: 34 g/dL (ref 32.0–36.0)
MCV: 93.1 fL (ref 80.0–100.0)
Platelets: 181 10*3/uL (ref 150–440)
RBC: 4.02 MIL/uL — ABNORMAL LOW (ref 4.40–5.90)
RDW: 14.5 % (ref 11.5–14.5)
WBC: 5.6 10*3/uL (ref 3.8–10.6)

## 2017-09-19 LAB — MRSA PCR SCREENING: MRSA BY PCR: NEGATIVE

## 2017-09-19 MED ORDER — LOSARTAN POTASSIUM 25 MG PO TABS
25.0000 mg | ORAL_TABLET | Freq: Every day | ORAL | Status: DC
  Administered 2017-09-19: 25 mg via ORAL
  Filled 2017-09-19 (×2): qty 1

## 2017-09-19 MED ORDER — INSULIN ASPART 100 UNIT/ML ~~LOC~~ SOLN
0.0000 [IU] | Freq: Three times a day (TID) | SUBCUTANEOUS | Status: DC
  Administered 2017-09-19: 3 [IU] via SUBCUTANEOUS
  Administered 2017-09-19: 2 [IU] via SUBCUTANEOUS
  Administered 2017-09-20: 11 [IU] via SUBCUTANEOUS
  Filled 2017-09-19 (×2): qty 1

## 2017-09-19 MED ORDER — INSULIN ASPART 100 UNIT/ML ~~LOC~~ SOLN
6.0000 [IU] | Freq: Three times a day (TID) | SUBCUTANEOUS | Status: DC
Start: 2017-09-19 — End: 2017-09-20
  Administered 2017-09-19 – 2017-09-20 (×3): 6 [IU] via SUBCUTANEOUS
  Filled 2017-09-19 (×3): qty 1

## 2017-09-19 MED ORDER — CEFAZOLIN SODIUM 1 G IJ SOLR
Freq: Once | INTRAMUSCULAR | Status: AC
  Administered 2017-09-20: 2 mL via INTRAVENOUS
  Filled 2017-09-19: qty 10

## 2017-09-19 MED ORDER — RENA-VITE PO TABS
1.0000 | ORAL_TABLET | Freq: Every day | ORAL | Status: DC
  Administered 2017-09-19: 1 via ORAL
  Filled 2017-09-19 (×2): qty 1

## 2017-09-19 MED ORDER — INSULIN DETEMIR 100 UNIT/ML ~~LOC~~ SOLN
34.0000 [IU] | Freq: Every day | SUBCUTANEOUS | Status: DC
  Administered 2017-09-19: 34 [IU] via SUBCUTANEOUS
  Filled 2017-09-19 (×2): qty 0.34

## 2017-09-19 MED ORDER — CEFAZOLIN SODIUM-DEXTROSE 1-4 GM/50ML-% IV SOLN: 1.0000 g | INTRAVENOUS | Status: DC

## 2017-09-19 MED ORDER — INSULIN ASPART 100 UNIT/ML ~~LOC~~ SOLN
0.0000 [IU] | Freq: Every day | SUBCUTANEOUS | Status: DC
  Administered 2017-09-19: 3 [IU] via SUBCUTANEOUS
  Filled 2017-09-19: qty 1

## 2017-09-19 NOTE — Progress Notes (Signed)
Inpatient Diabetes Program Recommendations  AACE/ADA: New Consensus Statement on Inpatient Glycemic Control (2015)  Target Ranges:  Prepandial:   less than 140 mg/dL      Peak postprandial:   less than 180 mg/dL (1-2 hours)      Critically ill patients:  140 - 180 mg/dL   Lab Results  Component Value Date   GLUCAP 119 (H) 09/19/2017   HGBA1C 8.2 (H) 02/28/2017    Review of Glycemic Control  Results for ADIB, WAHBA (MRN [id number]) as of 09/19/2017 07:59  Ref. Range 09/19/2017 02:59 09/19/2017 03:51 09/19/2017 04:54 09/19/2017 05:51 09/19/2017 06:53  Glucose-Capillary Latest Ref Range: 65 - 99 mg/dL 257 (H) 231 (H) 197 (H) 156 (H) 119 (H)     Outpatient Diabetes medications: Novolog 25-100 units tid, Levemir 60 units qday Current orders for Inpatient glycemic control: IV insulin via DKA IV insulin protocol  Inpatient Diabetes Program Recommendations:  CO2 30 and Anion Gap 12.  With MD order, can transition to SQ insulin- consider Levemir 30 units 2 hours before drip is stopped (reassess the need for an additional Levemir 30 units at hs then Levemir 30 units bid beginning tomorrow).    Consider Novolog 6 units tid with meals (hold if patient eats less than 50%) and Novolog resistant correction 0-20 units tid, Novolog 0-5 units qhs  Gentry Fitz, RN, [place], [place], CDE Diabetes Coordinator Inpatient Diabetes Program  [phone] (Team Pager) [phone] ([place]) 09/19/2017 8:06 AM

## 2017-09-19 NOTE — Care Management (Signed)
RNCM consult received for nocturnal supplemental O2. Referral made to [facility]. I do not see a chronic cardiopulmonary diagnosis at this time. Along with that, in order for home O2 to be paid for, medical necessity must include chronic diagnosis, overnight oximetry on room air at minimal 2 hours/ambulatory saturations and other methods tried and failed (IS/FV/Neb tx).

## 2017-09-19 NOTE — Progress Notes (Signed)
Initial Nutrition Assessment  DOCUMENTATION CODES:   Morbid obesity  INTERVENTION:  Encouraged adequate intake of protein at meals due to increased needs on HD.   Provide Rena-vite QHS to replace losses from HD.  Reviewed "Food Pyramid for Healthy Eating with Kidney Disease" handout with patient.  NUTRITION DIAGNOSIS:   Food and nutrition related knowledge deficit related to limited prior education as evidenced by per patient/family report.  GOAL:   Patient will meet greater than or equal to 90% of their needs  MONITOR:   PO intake, Labs, Weight trends, I & O's  REASON FOR ASSESSMENT:   Malnutrition Screening Tool    ASSESSMENT:   56 year old male with PMHx of DM type 2, neuropathy, ESRD on HD, HTN, sleep apnea, GERD admitted with DKA.   Met with patient at bedside. He reports his appetite has been decreased for 3-4 days. He reports he had an upper respiratory infection for the past 2 weeks. On Sunday he took allergy medicine and slept all days so he didn't eat anything. He also didn't eat anything on Monday because he didn't feel hungry. Yesterday before being admitted he had about 1/2 sandwich. He typically eats well. He reports eating 3 meals per day. He reports choosing meat at each meal and limiting high-phosphorus foods. He takes 2 phosphorus binders with meals and one with snacks. He reports he has been on HD since May and goes to the [facility] M/W/F. Patient reports he feels like he will be able to eat well now. He does not want any oral nutrition supplements. He reports he still makes a good amount of urine (reports 600 mL UOP this AM). His fluid restriction is 64 fl ounces daily (1920 mL). He would like some more information on the renal diet because he reports he has not received a handout on it before.  He reports his dry weight used to be 142 kg, but it has been decreasing. He is unsure if that is from them slowly pulling off fluid or if he is losing true  weight. Noted patient was last 142.4 kg on 05/09/2017. Now 131.8 kg.  Medications reviewed and include: Colace, Lasix 160 mg BID, Novolog 0-15 units TID, Novolog 0-5 units QHS, Novolog 6 units TID, Levemir 34 units daily, pantoprazole.  Labs reviewed: CBG 98-231, Chloride 95, BUN 52, Creatinine 6.78.  Patient does not meet criteria for malnutrition.  Discussed with RN.  NUTRITION - FOCUSED PHYSICAL EXAM:    Most Recent Value  Orbital Region  No depletion  Upper Arm Region  No depletion  Thoracic and Lumbar Region  No depletion  Buccal Region  No depletion  Temple Region  No depletion  Clavicle Bone Region  No depletion  Clavicle and Acromion Bone Region  No depletion  Scapular Bone Region  No depletion  Dorsal Hand  No depletion  Patellar Region  No depletion  Anterior Thigh Region  No depletion  Posterior Calf Region  No depletion  Edema (RD Assessment)  Moderate  Hair  Reviewed  Eyes  Reviewed  Mouth  Reviewed  Skin  Reviewed  Nails  Reviewed     Diet Order:  Diet Carb Modified Fluid consistency: Thin; Room service appropriate? Yes  EDUCATION NEEDS:   Education needs have been addressed  Skin:  Skin Assessment: Reviewed RN Assessment(no issues)  Last BM:  Unknown  Height:   Ht Readings from Last 1 Encounters:  09/18/17 '5\' 10"'  (1.778 m)    Weight:   Wt  Readings from Last 1 Encounters:  09/18/17 290 lb 9.1 oz (131.8 kg)    Ideal Body Weight:  75.5 kg  BMI:  Body mass index is 41.69 kg/m.  Estimated Nutritional Needs:   Kcal:  0312-8118 (MSJ x 1.1-1.2)  Protein:  >130 grams (1 gram/kg)  Fluid:  UOP + 1 L  Willey Blade, MS, RD, LDN Office: [phone] Pager: [phone] After Hours/Weekend Pager: [phone]

## 2017-09-19 NOTE — Progress Notes (Signed)
[facility] [place] NAME: Steven [name]    MR#:  [id number]  DATE OF BIRTH:  January 13, 1961  SUBJECTIVE: Patient came for catheter dysfunction, had urgent hemodialysis last evening but found to have elevated sugars, DKA so he is admitted okay for DKA, now he feels better and he is out of DKA now.  Transferred out of ICU to regular room.  Denies any complaints.  CHIEF COMPLAINT:  No chief complaint on file.   REVIEW OF SYSTEMS:    Review of Systems  Constitutional: Negative for chills and fever.  HENT: Negative for hearing loss.   Eyes: Negative for blurred vision, double vision and photophobia.  Respiratory: Negative for cough, hemoptysis and shortness of breath.   Cardiovascular: Negative for palpitations, orthopnea and leg swelling.  Gastrointestinal: Negative for abdominal pain, diarrhea and vomiting.  Genitourinary: Negative for dysuria and urgency.  Musculoskeletal: Negative for myalgias and neck pain.  Skin: Negative for rash.  Neurological: Negative for dizziness, focal weakness, seizures, weakness and headaches.  Psychiatric/Behavioral: Negative for memory loss. The patient does not have insomnia.     Nutrition: Tolerating Diet: Tolerating PT:      DRUG ALLERGIES:   Allergies  Allergen Reactions  . Codeine Nausea And Vomiting    VITALS:  Blood pressure (!) 127/54, pulse (!) 57, temperature 98.7 F (37.1 C), temperature source Oral, resp. rate 20, height 5\' 10"  (1.778 m), weight 131.8 kg (290 lb 9.1 oz), SpO2 92 %.  PHYSICAL EXAMINATION:   Physical Exam  GENERAL:  56 y.o.-year-old patient lying in the bed with no acute distress.  Obese male not in distress. EYES: Pupils equal, round, reactive to light and accommodation. No scleral icterus. Extraocular muscles intact.  HEENT: Head atraumatic, normocephalic. Oropharynx and nasopharynx clear.  NECK:  Supple, no jugular venous distention. No thyroid enlargement, no  tenderness.  LUNGS: Normal breath sounds bilaterally, no wheezing, rales,rhonchi or crepitation. No use of accessory muscles of respiration.  CARDIOVASCULAR: S1, S2 normal. No murmurs, rubs, or gallops.  Obese male ABDOMEN: Soft, nontender, nondistended. Bowel sounds present. No organomegaly or mass.  EXTREMITIES: No pedal edema, cyanosis, or clubbing.  NEUROLOGIC: Cranial nerves II through XII are intact. Muscle strength 5/5 in all extremities. Sensation intact. Gait not checked.  PSYCHIATRIC: The patient is alert and oriented x 3.  SKIN: No obvious rash, lesion, or ulcer.    LABORATORY PANEL:   CBC Recent Labs  Lab 09/19/17 0159  WBC 5.6  HGB 12.7*  HCT 37.4*  PLT 181   ------------------------------------------------------------------------------------------------------------------  Chemistries  Recent Labs  Lab 09/18/17 1512  09/19/17 0551  NA 130*   < > 137  K 5.9*   < > 3.6  CL 90*   < > 95*  CO2 20*   < > 30  GLUCOSE 707*   < > 180*  BUN 94*   < > 52*  CREATININE 9.97*   < > 6.78*  CALCIUM 8.9   < > 8.6*  MG 2.6*  --   --    < > = values in this interval not displayed.   ------------------------------------------------------------------------------------------------------------------  Cardiac Enzymes No results for input(s): TROPONINI in the last 168 hours. ------------------------------------------------------------------------------------------------------------------  RADIOLOGY:  Portable Chest X-ray (1 View)  Result Date: 09/18/2017 CLINICAL DATA:  Shortness of breath. EXAM: PORTABLE CHEST 1 VIEW COMPARISON:  Radiograph of March 29, 2017 FINDINGS: The heart size and mediastinal contours are within normal limits. Both lungs are clear. Right internal  jugular dialysis catheter is noted with distal tip in expected position of cavoatrial junction. No pneumothorax or pleural effusion is noted. The visualized skeletal structures are unremarkable. IMPRESSION: No  acute cardiopulmonary abnormality seen. Electronically Signed   By: Marijo Conception, M.D.   On: 09/18/2017 14:12     ASSESSMENT AND PLAN:    DKA: Resolved, patient started on Lantus, sliding scale insulin with coverage, mealtime coverage.  Anion gap is closed.  Seen by diabetic nurse. 2.  Hypoxia due to morbid obesity and possible obstructive sleep apnea: Patient needs sleep study as an outpatient.  We will check overnight pulse ox on room air to see if she qualifies for home O2. 3.  ESRD; dialysis catheter dysfunction, urgenthemodialysis last evening and tolerated the procedure well. #4 essential hypertension: Controlled.  Continue furosemide 160 mg twice daily, losartan 25 mg daily daily, metoprolol 25 mg twice daily.  Hold metoprolol if the heart rate is less than 60.     All the records are reviewed and case discussed with Care Management/Social Workerr. Management plans discussed with the patient, family and they are in agreement.  CODE STATUS: Full code   TOTAL TIME TAKING CARE OF THIS PATIENT: 35 minutes.   POSSIBLE D/C IN 1-2 DAYS, DEPENDING ON CLINICAL CONDITION.   Epifanio Lesches M.D on 09/19/2017 at 4:26 PM  Between 7am to 6pm - Pager - [phone]  After 6pm go to [email or web address] - password [email or web address] [place] [facility]  Office  [phone]  CC: Primary care physician; [name], MD

## 2017-09-19 NOTE — Consult Note (Signed)
[facility] Admission History & Physical  MRN : [id number]  Steven [name] is a 56 y.o. ([birth year]) male who presents with chief complaint of No chief complaint on file. Marland Kitchen  History of Present Illness: I am asked to evaluate the patient by Dr Humphrey Rolls. The patient was sent here because they have a nonfunctioning tunneled catheter.  On arrival to [facility] he was noted to be in DKA with poor O2 sats and so was transferred to the [facility].  He is now markedly improved    Patient denies pain or tenderness overlying the access.  There is no pain with dialysis.  No fevers or chills while on dialysis.   Current Facility-Administered Medications  Medication Dose Route Frequency Provider Last Rate Last Dose  . 0.9 %  sodium chloride infusion  250 mL Intravenous PRN Demetrios Loll, MD      . 0.9 %  sodium chloride infusion   Intravenous Continuous Mikael Spray, NP   Stopped at 09/18/17 2200  . 0.9 %  sodium chloride infusion  100 mL Intravenous PRN Lateef, Munsoor, MD      . 0.9 %  sodium chloride infusion  100 mL Intravenous PRN Lateef, Munsoor, MD      . acetaminophen (TYLENOL) tablet 650 mg  650 mg Oral Q6H PRN Demetrios Loll, MD       Or  . acetaminophen (TYLENOL) suppository 650 mg  650 mg Rectal Q6H PRN Demetrios Loll, MD      . albuterol (PROVENTIL) (2.5 MG/3ML) 0.083% nebulizer solution 2.5 mg  2.5 mg Nebulization Q2H PRN Demetrios Loll, MD      . aspirin EC tablet 81 mg  81 mg Oral Daily Demetrios Loll, MD   81 mg at 09/19/17 0912  . [START ON 09/20/2017] ceFAZolin (ANCEF) IVPB 1 g/50 mL premix  1 g Intravenous On Call Riely Oetken, Dolores Lory, MD      . furosemide (LASIX) tablet 160 mg  160 mg Oral BID Demetrios Loll, MD   160 mg at 09/19/17 0848  . gabapentin (NEURONTIN) capsule 300 mg  300 mg Oral BID Demetrios Loll, MD   300 mg at 09/19/17 0911  . heparin injection 1,000 Units  1,000 Units Dialysis PRN Anthonette Legato, MD      . heparin injection 5,000 Units  5,000 Units Subcutaneous Q8H Demetrios Loll, MD   5,000 Units at 09/19/17 1440  . hydrALAZINE (APRESOLINE) injection 10 mg  10 mg Intravenous Q6H PRN Demetrios Loll, MD   10 mg at 09/19/17 0912  . HYDROcodone-acetaminophen (NORCO/VICODIN) 5-325 MG per tablet 1-2 tablet  1-2 tablet Oral Q6H PRN Demetrios Loll, MD      . HYDROmorphone (DILAUDID) injection 1 mg  1 mg Intravenous Once PRN Stegmayer, Kimberly A, PA-C      . insulin aspart (novoLOG) injection 0-15 Units  0-15 Units Subcutaneous TID WC Nettie Elm, MD   2 Units at 09/19/17 1139  . insulin aspart (novoLOG) injection 0-5 Units  0-5 Units Subcutaneous QHS Nettie Elm, MD      . insulin aspart (novoLOG) injection 6 Units  6 Units Subcutaneous TID WC Nettie Elm, MD   6 Units at 09/19/17 1138  . insulin detemir (LEVEMIR) injection 34 Units  34 Units Subcutaneous Daily Nettie Elm, MD   34 Units at 09/19/17 0848  . lidocaine (PF) (XYLOCAINE) 1 % injection 5 mL  5 mL Intradermal PRN Lateef, Munsoor, MD      . lidocaine-prilocaine (EMLA) cream 1 application  1 application Topical PRN Lateef, Munsoor, MD      . losartan (COZAAR) tablet 25 mg  25 mg Oral Daily Nettie Elm, MD   25 mg at 09/19/17 1138  . metoprolol tartrate (LOPRESSOR) tablet 25 mg  25 mg Oral BID Demetrios Loll, MD      . midazolam (VERSED) 2 MG/ML syrup 10 mg  10 mg Oral Once Kalei Meda, Dolores Lory, MD      . multivitamin (RENA-VIT) tablet 1 tablet  1 tablet Oral QHS Tukov, Magadalene S, NP      . ondansetron (ZOFRAN) injection 4 mg  4 mg Intravenous Q6H PRN Stegmayer, Kimberly A, PA-C      . oxymetazoline (AFRIN) 0.05 % nasal spray 1 spray  1 spray Each Nare Daily PRN Demetrios Loll, MD      . pantoprazole (PROTONIX) EC tablet 40 mg  40 mg Oral Daily Demetrios Loll, MD   40 mg at 09/19/17 0911  . pentafluoroprop-tetrafluoroeth (GEBAUERS) aerosol 1 application  1 application Topical PRN Lateef, Munsoor, MD      . senna-docusate (Senokot-S) tablet 1 tablet  1 tablet Oral QHS PRN Demetrios Loll, MD      . sodium chloride flush (NS) 0.9 %  injection 3 mL  3 mL Intravenous Q12H Demetrios Loll, MD   3 mL at 09/18/17 2143  . sodium chloride flush (NS) 0.9 % injection 3 mL  3 mL Intravenous PRN Demetrios Loll, MD        Past Medical History:  Diagnosis Date  . Anemia   . Cellulitis and abscess of right lower extremity 03/01/2017  . Chronic kidney disease    dialysis m,w,f  . Diabetes mellitus without complication ([place])   . Edema extremities 03/01/2017   bilateral swelling  . GERD (gastroesophageal reflux disease)   . High cholesterol   . High triglycerides   . Hypertension   . Neuropathy   . Sleep apnea    can't use cpap d/t feelings of suffocation    Past Surgical History:  Procedure Laterality Date  . A/V FISTULAGRAM Right 08/27/2017   Procedure: A/V FISTULAGRAM;  Surgeon: Katha Cabal, MD;  Location: [name];  Service: Cardiovascular;  Laterality: Right;  . APPENDECTOMY  2010  . AV FISTULA PLACEMENT Right 06/14/2017   Procedure: ARTERIOVENOUS (AV) FISTULA CREATION WRIST;  Surgeon: Katha Cabal, MD;  Location: [facility];  Service: Vascular;  Laterality: Right;  . DIALYSIS/PERMA CATHETER INSERTION N/A 03/05/2017   Procedure: Dialysis/Perma Catheter Insertion;  Surgeon: Katha Cabal, MD;  Location: [facility];  Service: Cardiovascular;  Laterality: N/A;  . EXCHANGE OF A DIALYSIS CATHETER  03/29/2017   Procedure: Exchange Of A Dialysis Catheter;  Surgeon: Katha Cabal, MD;  Location: [facility];  Service: Cardiovascular;;  . IRRIGATION AND DEBRIDEMENT FOOT Right 03/01/2017   Procedure: IRRIGATION AND DEBRIDEMENT FOOT;  Surgeon: Albertine Patricia, DPM;  Location: [facility];  Service: Podiatry;  Laterality: Right;  application of wound vac    Social History Social History   Tobacco Use  . Smoking status: Never Smoker  . Smokeless tobacco: Current User    Types: Snuff  Substance Use Topics  . 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  Drug use: No    Family History Family History  Problem Relation Age of Onset  . CAD Father     No family history of bleeding or clotting disorders, autoimmune  disease or porphyria  Allergies  Allergen Reactions  . Codeine Nausea And Vomiting     REVIEW OF SYSTEMS (Negative unless checked)  Constitutional: [] Weight loss  [] Fever  [] Chills Cardiac: [] Chest pain   [] Chest pressure   [] Palpitations   [] Shortness of breath when laying flat   [] Shortness of breath at rest   [x] Shortness of breath with exertion. Vascular:  [] Pain in legs with walking   [] Pain in legs at rest   [] Pain in legs when laying flat   [] Claudication   [] Pain in feet when walking  [] Pain in feet at rest  [] Pain in feet when laying flat   [] History of DVT   [] Phlebitis   [] Swelling in legs   [] Varicose veins   [] Non-healing ulcers Pulmonary:   [] Uses home oxygen   [] Productive cough   [] Hemoptysis   [] Wheeze  [] COPD   [] Asthma Neurologic:  [] Dizziness  [] Blackouts   [] Seizures   [] History of stroke   [] History of TIA  [] Aphasia   [] Temporary blindness   [] Dysphagia   [] Weakness or numbness in arms   [] Weakness or numbness in legs Musculoskeletal:  [] Arthritis   [] Joint swelling   [] Joint pain   [] Low back pain Hematologic:  [] Easy bruising  [] Easy bleeding   [] Hypercoagulable state   [] Anemic  [] Hepatitis Gastrointestinal:  [] Blood in stool   [] Vomiting blood  [] Gastroesophageal reflux/heartburn   [] Difficulty swallowing. Genitourinary:  [x] Chronic kidney disease   [] Difficult urination  [] Frequent urination  [] Burning with urination   [] Blood in urine Skin:  [] Rashes   [] Ulcers   [] Wounds Psychological:  [] History of anxiety   []  History of major depression.  Physical Examination  Vitals:   09/19/17 1200 09/19/17 1300 09/19/17 1400 09/19/17 1534  BP: 125/64 (!) 126/59 111/64 (!) 127/54  Pulse: (!) 53 (!) 52 (!) 58 (!) 57  Resp:    20  Temp:    98.7 F (37.1 C)  TempSrc:    Oral  SpO2: 98% 94% 93% 92%  Weight:       Height:       Body mass index is 41.69 kg/m. Gen: WD/WN, obese Head: [place]/AT, No temporalis wasting. Prominent temp pulse not noted. Ear/Nose/Throat: Hearing grossly intact, nares w/o erythema or drainage, oropharynx w/o Erythema/Exudate,  Eyes: Conjunctiva clear, sclera non-icteric Neck: Trachea midline.  No JVD.  Pulmonary:  Good air movement, respirations not labored, no use of accessory muscles.  Cardiac: RRR, normal S1, S2. Vascular: right IJ catheter nonfunctional no erythremia no drainage Vessel Right Left  Radial Palpable Palpable  Ulnar Not Palpable Not Palpable  Brachial Palpable Palpable  Carotid Palpable, without bruit Palpable, without bruit  Gastrointestinal: soft, non-tender/non-distended. No guarding/reflex.  Musculoskeletal: M/S 5/5 throughout.  Extremities without ischemic changes.  No deformity or atrophy.  Neurologic: Sensation grossly intact in extremities.  Symmetrical.  Speech is fluent. Motor exam as listed above. Psychiatric: Judgment intact, Mood & affect appropriate for pt's clinical situation. Dermatologic: No rashes or ulcers noted.  No cellulitis or open wounds. Lymph : No Cervical, Axillary, or Inguinal lymphadenopathy.   CBC Lab Results  Component Value Date   WBC 5.6 09/19/2017   HGB 12.7 (L) 09/19/2017   HCT 37.4 (L) 09/19/2017   MCV 93.1 09/19/2017   PLT 181 09/19/2017    BMET    Component Value Date/Time   NA 137 09/19/2017 0551   K 3.6 09/19/2017 0551   CL 95 (L) 09/19/2017 0551   CO2 30 09/19/2017 0551   GLUCOSE 180 (H)  09/19/2017 0551   BUN 52 (H) 09/19/2017 0551   CREATININE 6.78 (H) 09/19/2017 0551   CREATININE 1.41 (H) 12/07/2011 1030   CALCIUM 8.6 (L) 09/19/2017 0551   GFRNONAA 8 (L) 09/19/2017 0551   GFRAA 9 (L) 09/19/2017 0551   Estimated Creatinine Clearance: 16.8 mL/min (A) (by C-G formula based on SCr of 6.78 mg/dL (H)).  COAG Lab Results  Component Value Date   INR 1.07 05/23/2017    Radiology Portable  Chest X-ray (1 View)  Result Date: 09/18/2017 CLINICAL DATA:  Shortness of breath. EXAM: PORTABLE CHEST 1 VIEW COMPARISON:  Radiograph of March 29, 2017 FINDINGS: The heart size and mediastinal contours are within normal limits. Both lungs are clear. Right internal jugular dialysis catheter is noted with distal tip in expected position of cavoatrial junction. No pneumothorax or pleural effusion is noted. The visualized skeletal structures are unremarkable. IMPRESSION: No acute cardiopulmonary abnormality seen. Electronically Signed   By: Marijo Conception, M.D.   On: 09/18/2017 14:12    Assessment/Plan 1.  Complication dialysis device with thrombosis AV access:  Patient's right Tunneled catheter is malfunctioning. Therefore, the patient will undergo exchange and or replacement of the tunneled catheter under local anesthesia.  The risks and benefits were described to the patient.  All questions were answered.  The patient agrees to proceed with angiography and intervention. Potassium will be drawn to ensure that it is an appropriate level prior to performing intervention. 2.  End-stage renal disease requiring hemodialysis:  Patient will continue dialysis therapy without further interruption with a tunneled catheter. Dialysis has already been arranged. 3.  Hypertension:  Patient will continue medical management; nephrology is following no changes in oral medications. 4. Diabetes mellitus:  Glucose will be monitored and oral medications been held this morning once the patient has undergone the patient's procedure po intake will be reinitiated and again Accu-Cheks will be used to assess the blood glucose level and treat as needed. The patient will be restarted on the patient's usual hypoglycemic regime   Hortencia Pilar, MD  09/19/2017 4:54 PM

## 2017-09-19 NOTE — Progress Notes (Signed)
PULMONARY / CRITICAL CARE MEDICINE   Name: Steven [name] MRN: [id number] DOB: [birth year]    ADMISSION DATE:  09/18/2017   CONSULTATION DATE:  09/18/2017  REFERRING MD:  Dr. Bridgett Larsson  REASON: DKA  HISTORY OF PRESENT ILLNESS: 56 year old male with a past medical history as indicated below who presented to the hospital for a Vas-Cath replacement.  He was found to be severely hyperglycemic and hypokalemic.  He is being admitted to the ICU and insulin infusion.  He is due for hemodialysis.  SUBJECTIVE: Blood glucose levels improved; now in the 140s. Anion gap closed. Became hypoxic overnight requiring oxygen. Patient has a h/o sleep apnea but has not been compliant with CPAP. Otherwise offers no other complaints     VITAL SIGNS: BP (!) 117/55   Pulse (!) 55   Temp 98.5 F (36.9 C) (Oral)   Resp 17   Ht 5\' 10"  (1.778 m)   Wt 131.8 kg (290 lb 9.1 oz)   SpO2 100%   BMI 41.69 kg/m   HEMODYNAMICS:    VENTILATOR SETTINGS: FiO2 (%):  [3 %-40 %] 35 %  INTAKE / OUTPUT: I/O last 3 completed shifts: In: 288 [I.V.:288] Out: -67 [Urine:350]  PHYSICAL EXAMINATION: General: Appears acutely ill Neuro: Alert and oriented x3, cranial nerves intact HEENT: PERRLA, neck is supple with good range of motion, mild JVD Cardiovascular: Apical pulse regular, S1-S2, no murmur regurg or gallop, +2 pulses, +3 pitting edema in right lower extremity trace edema in left lower extremity Lungs:  Bilateral breath sounds, diminished in the bases, no wheeze Abdomen: Soft, nontender, nondistended, normal bowel sounds Musculoskeletal: No deformities Skin: Warm and dry  LABS:  BMET Recent Labs  Lab 09/18/17 2045 09/19/17 0159 09/19/17 0551  NA 135 135 137  K 4.1 3.7 3.6  CL 94* 94* 95*  CO2 24 27 30   BUN 91* 49* 52*  CREATININE 10.11* 6.40* 6.78*  GLUCOSE 316* 242* 180*    Electrolytes Recent Labs  Lab 09/18/17 1512 09/18/17 2045 09/19/17 0159 09/19/17 0551  CALCIUM 8.9 9.1 8.6* 8.6*  MG  2.6*  --   --   --   PHOS 7.3* 6.5*  --   --     CBC Recent Labs  Lab 09/18/17 1205 09/19/17 0159  WBC 5.8 5.6  HGB 13.6 12.7*  HCT 41.8 37.4*  PLT 203 181    Coag's No results for input(s): APTT, INR in the last 168 hours.  Sepsis Markers No results for input(s): LATICACIDVEN, PROCALCITON, O2SATVEN in the last 168 hours.  ABG No results for input(s): PHART, PCO2ART, PO2ART in the last 168 hours.  Liver Enzymes No results for input(s): AST, ALT, ALKPHOS, BILITOT, ALBUMIN in the last 168 hours.  Cardiac Enzymes No results for input(s): TROPONINI, PROBNP in the last 168 hours.  Glucose Recent Labs  Lab 09/19/17 0551 09/19/17 0653 09/19/17 0808 09/19/17 0903 09/19/17 1002 09/19/17 1057  GLUCAP 156* 119* 101* 98 102* 149*    Imaging Portable Chest X-ray (1 View)  Result Date: 09/18/2017 CLINICAL DATA:  Shortness of breath. EXAM: PORTABLE CHEST 1 VIEW COMPARISON:  Radiograph of March 29, 2017 FINDINGS: The heart size and mediastinal contours are within normal limits. Both lungs are clear. Right internal jugular dialysis catheter is noted with distal tip in expected position of cavoatrial junction. No pneumothorax or pleural effusion is noted. The visualized skeletal structures are unremarkable. IMPRESSION: No acute cardiopulmonary abnormality seen. Electronically Signed   By: Marijo Conception, M.D.   On:  09/18/2017 14:12   SIGNIFICANT EVENTS: 09/18/2017: Admitted  LINES/TUBES: Peripheral IVs  DISCUSSION: 56 year old male presenting with hyperglycemia/DKA secondary to uncontrolled type 2 diabetes and hypokalemia  ASSESSMENT  DKA-resolved Hyperkalemia-resolved Uncontrolled type 2 diabetes Uncontrolled hypertension End-stage renal disease on hemodialysis Chronic pain H/O Sleep apnea PLAN Off IV insulin Start sliding scale and meal coverage Continue all home blood pressure medications As needed hydralazine to keep systolic blood pressure less than  160 Hemoglobin A1c pending Gentle IV hydration Hemodialysis per nephrology As needed pain medications  Patient will need nocturnal CPAP while hospitalized and nocturnal oxygen upon discharge He will [place] need a repeat sleep study and pulmonary follow-up GI and DVT prophylaxis  Patient is stable for floor transfer  FAMILY  - Updates: Patient updated on current treatment plan  - Inter-disciplinary family meet or Palliative Care meeting due by:  day 7   [facility] ANP-BC Pulmonary and Critical Care Medicine [facility] Pager [phone] or [phone]  09/19/2017, 12:06 PM

## 2017-09-19 NOTE — Progress Notes (Signed)
[facility]  ROUNDING NOTE   Subjective:  Patient well known to [place]. Came to hospital with catheter dysfunction.  Had urgent HD last evening. Tolerated well.    Objective:  Vital signs in last 24 hours:  Temp:  [97.9 F (36.6 C)-98.5 F (36.9 C)] 98.5 F (36.9 C) (12/13 0200) Pulse Rate:  [40-68] 55 (12/13 1100) Resp:  [13-21] 17 (12/13 0030) BP: (71-174)/(50-85) 117/55 (12/13 1100) SpO2:  [86 %-100 %] 100 % (12/13 1100) FiO2 (%):  [3 %-40 %] 35 % (12/12 2119) Weight:  [131.8 kg (290 lb 9.1 oz)-131.9 kg (290 lb 12.6 oz)] 131.8 kg (290 lb 9.1 oz) (12/12 2352)  Weight change:  Filed Weights   09/18/17 1205 09/18/17 1815 09/18/17 2352  Weight: 132.6 kg (292 lb 5.3 oz) 131.9 kg (290 lb 12.6 oz) 131.8 kg (290 lb 9.1 oz)    Intake/Output: I/O last 3 completed shifts: In: 288 [I.V.:288] Out: -67 [Urine:350]   Intake/Output this shift:  No intake/output data recorded.  Physical Exam: General: No acute distress  Head: Normocephalic, atraumatic. Moist oral mucosal membranes  Eyes: Anicteric  Neck: Supple, trachea midline  Lungs:  Clear to auscultation, normal effort  Heart: S1S2 no rubs  Abdomen:  Soft, nontender, bowel sounds present  Extremities:  peripheral edema.  Neurologic: Awake, alert, following commands  Skin: No lesions  Access: IJ PC in place    Basic Metabolic Panel: Recent Labs  Lab 09/18/17 1205 09/18/17 1512 09/18/17 2045 09/19/17 0159 09/19/17 0551  NA 130* 130* 135 135 137  K 6.0* 5.9* 4.1 3.7 3.6  CL 89* 90* 94* 94* 95*  CO2 21* 20* 24 27 30   GLUCOSE 716* 707* 316* 242* 180*  BUN 93* 94* 91* 49* 52*  CREATININE 9.92* 9.97* 10.11* 6.40* 6.78*  CALCIUM 8.9 8.9 9.1 8.6* 8.6*  MG  --  2.6*  --   --   --   PHOS  --  7.3* 6.5*  --   --     Liver Function Tests: No results for input(s): AST, ALT, ALKPHOS, BILITOT, PROT, ALBUMIN in the last 168 hours. No results for input(s): LIPASE, AMYLASE in the last 168 hours. No results for  input(s): AMMONIA in the last 168 hours.  CBC: Recent Labs  Lab 09/18/17 1205 09/19/17 0159  WBC 5.8 5.6  HGB 13.6 12.7*  HCT 41.8 37.4*  MCV 98.0 93.1  PLT 203 181    Cardiac Enzymes: No results for input(s): CKTOTAL, CKMB, CKMBINDEX, TROPONINI in the last 168 hours.  BNP: Invalid input(s): POCBNP  CBG: Recent Labs  Lab 09/19/17 0653 09/19/17 0808 09/19/17 0903 09/19/17 1002 09/19/17 1057  GLUCAP 119* 101* 98 102* 149*    Microbiology: Results for orders placed or performed during the hospital encounter of 09/18/17  MRSA PCR Screening     Status: None   Collection Time: 09/18/17  2:34 PM  Result Value Ref Range Status   MRSA by PCR NEGATIVE NEGATIVE Final    Comment:        The GeneXpert MRSA Assay (FDA approved for NASAL specimens only), is one component of a comprehensive MRSA colonization surveillance program. It is not intended to diagnose MRSA infection nor to guide or monitor treatment for MRSA infections.     Coagulation Studies: No results for input(s): LABPROT, INR in the last 72 hours.  Urinalysis: No results for input(s): COLORURINE, LABSPEC, PHURINE, GLUCOSEU, HGBUR, BILIRUBINUR, KETONESUR, PROTEINUR, UROBILINOGEN, NITRITE, LEUKOCYTESUR in the last 72 hours.  Invalid input(s): APPERANCEUR  Imaging: Portable Chest X-ray (1 View)  Result Date: 09/18/2017 CLINICAL DATA:  Shortness of breath. EXAM: PORTABLE CHEST 1 VIEW COMPARISON:  Radiograph of March 29, 2017 FINDINGS: The heart size and mediastinal contours are within normal limits. Both lungs are clear. Right internal jugular dialysis catheter is noted with distal tip in expected position of cavoatrial junction. No pneumothorax or pleural effusion is noted. The visualized skeletal structures are unremarkable. IMPRESSION: No acute cardiopulmonary abnormality seen. Electronically Signed   By: Marijo Conception, M.D.   On: 09/18/2017 14:12     Medications:   . sodium chloride    . sodium  chloride Stopped (09/18/17 2200)  . sodium chloride    . sodium chloride    . dextrose 5 % and 0.45% NaCl 100 mL/hr at 09/18/17 2159  . insulin (NOVOLIN-R) infusion 1.6 Units/hr (09/19/17 0755)   . aspirin EC  81 mg Oral Daily  . docusate sodium  100 mg Oral BID  . furosemide  160 mg Oral BID  . gabapentin  300 mg Oral BID  . heparin  5,000 Units Subcutaneous Q8H  . insulin aspart  0-15 Units Subcutaneous TID WC  . insulin aspart  0-5 Units Subcutaneous QHS  . insulin aspart  6 Units Subcutaneous TID WC  . insulin detemir  34 Units Subcutaneous Daily  . losartan  25 mg Oral Daily  . metoprolol tartrate  25 mg Oral BID  . midazolam  10 mg Oral Once  . pantoprazole  40 mg Oral Daily  . sodium chloride flush  3 mL Intravenous Q12H   sodium chloride, sodium chloride, sodium chloride, acetaminophen **OR** acetaminophen, albuterol, heparin, hydrALAZINE, HYDROcodone-acetaminophen, HYDROmorphone (DILAUDID) injection, lidocaine (PF), lidocaine-prilocaine, ondansetron (ZOFRAN) IV, oxymetazoline, pentafluoroprop-tetrafluoroeth, senna-docusate, sodium chloride flush  Assessment/ Plan:  56 y.o. male with hypertension, diabetes mellitus type 2 insulin dependent, hyperlipidemia, GERD, and morbid obesity  [facility]/MWF  1. 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  He tolerated well.  We will plan for dialysis again tomorrow.  2. Hypertension: continue losartan, metoprolol, will monitor.   3. Anemia of chronic kidney disease: hgb currently 12.7, hold off on epogen.   4. Secondary Hyperparathyroidism: check phosphorus with next HD.    LOS: 1 Lanique Gonzalo 12/13/201812:39 PM

## 2017-09-19 NOTE — Progress Notes (Signed)
UF off during dialysis r/t drop in BP, BP remained decreased despite the UF being turned off. Immediately following HD tx, pts BP returned to baseline.

## 2017-09-19 NOTE — Progress Notes (Signed)
Per Dr. Humphrey Rolls, we are to treat patient's BP of a systolic greater than 226 (MAR states to treat greater than 180).

## 2017-09-20 ENCOUNTER — Inpatient Hospital Stay: Payer: Medicare Other | Admitting: Certified Registered Nurse Anesthetist

## 2017-09-20 ENCOUNTER — Encounter: Payer: Self-pay | Admitting: Certified Registered Nurse Anesthetist

## 2017-09-20 ENCOUNTER — Encounter
Admission: RE | Disposition: A | Discharge status: Home / Self Care | Payer: Self-pay | Source: Ambulatory Visit | Attending: Internal Medicine

## 2017-09-20 DIAGNOSIS — T82868A Thrombosis of vascular prosthetic devices, implants and grafts, initial encounter: Secondary | ICD-10-CM

## 2017-09-20 DIAGNOSIS — N186 End stage renal disease: Secondary | ICD-10-CM

## 2017-09-20 HISTORY — PX: DIALYSIS/PERMA CATHETER INSERTION: CATH118288

## 2017-09-20 LAB — BASIC METABOLIC PANEL
ANION GAP: 13 (ref 5–15)
BUN: 58 mg/dL — AB (ref 6–20)
CHLORIDE: 94 mmol/L — AB (ref 101–111)
CO2: 27 mmol/L (ref 22–32)
Calcium: 8.3 mg/dL — ABNORMAL LOW (ref 8.9–10.3)
Creatinine, Ser: 7.42 mg/dL — ABNORMAL HIGH (ref 0.61–1.24)
GFR calc Af Amer: 9 mL/min — ABNORMAL LOW (ref >60)
GFR calc non Af Amer: 7 mL/min — ABNORMAL LOW (ref >60)
Glucose, Bld: 290 mg/dL — ABNORMAL HIGH (ref 65–99)
POTASSIUM: 4.3 mmol/L (ref 3.5–5.1)
SODIUM: 134 mmol/L — AB (ref 135–145)

## 2017-09-20 LAB — GLUCOSE, CAPILLARY
GLUCOSE-CAPILLARY: 329 mg/dL — AB (ref 65–99)
GLUCOSE-CAPILLARY: 326 mg/dL — AB (ref 65–99)
GLUCOSE-CAPILLARY: 349 mg/dL — AB (ref 65–99)
Glucose-Capillary: 302 mg/dL — ABNORMAL HIGH (ref 65–99)

## 2017-09-20 LAB — HEPATITIS B SURFACE ANTIGEN: HEP B S AG: NEGATIVE

## 2017-09-20 LAB — PARATHYROID HORMONE, INTACT (NO CA): PTH: 168 pg/mL — ABNORMAL HIGH (ref 15–65)

## 2017-09-20 SURGERY — DIALYSIS/PERMA CATHETER INSERTION
Anesthesia: General

## 2017-09-20 MED ORDER — HEPARIN SODIUM (PORCINE) 10000 UNIT/ML IJ SOLN
INTRAMUSCULAR | Status: AC
  Filled 2017-09-20: qty 1

## 2017-09-20 MED ORDER — LIDOCAINE HCL (PF) 2 % IJ SOLN
INTRAMUSCULAR | Status: AC
  Filled 2017-09-20: qty 10

## 2017-09-20 MED ORDER — INSULIN DETEMIR 100 UNIT/ML ~~LOC~~ SOLN
25.0000 [IU] | Freq: Two times a day (BID) | SUBCUTANEOUS | Status: DC
  Filled 2017-09-20: qty 0.25

## 2017-09-20 MED ORDER — PROPOFOL 10 MG/ML IV BOLUS
INTRAVENOUS | Status: AC
  Filled 2017-09-20: qty 20

## 2017-09-20 MED ORDER — MIDAZOLAM HCL 2 MG/2ML IJ SOLN
INTRAMUSCULAR | Status: AC
  Filled 2017-09-20: qty 2

## 2017-09-20 MED ORDER — ONDANSETRON HCL 4 MG/2ML IJ SOLN
INTRAMUSCULAR | Status: AC
  Filled 2017-09-20: qty 2

## 2017-09-20 MED ORDER — LIDOCAINE-EPINEPHRINE (PF) 1 %-1:200000 IJ SOLN
INTRAMUSCULAR | Status: AC
  Filled 2017-09-20: qty 30

## 2017-09-20 MED ORDER — DOCUSATE SODIUM 100 MG PO CAPS: 100.0000 mg | ORAL_CAPSULE | Freq: Two times a day (BID) | ORAL | Status: DC

## 2017-09-20 MED ORDER — FENTANYL CITRATE (PF) 100 MCG/2ML IJ SOLN: 25.0000 ug | INTRAMUSCULAR | Status: DC | PRN

## 2017-09-20 MED ORDER — EPHEDRINE SULFATE 50 MG/ML IJ SOLN
INTRAMUSCULAR | Status: AC
  Filled 2017-09-20: qty 1

## 2017-09-20 MED ORDER — FENTANYL CITRATE (PF) 100 MCG/2ML IJ SOLN
INTRAMUSCULAR | Status: DC | PRN
  Administered 2017-09-20 (×2): 50 ug via INTRAVENOUS

## 2017-09-20 MED ORDER — INSULIN DETEMIR 100 UNIT/ML ~~LOC~~ SOLN: 25.0000 [IU] | Freq: Two times a day (BID) | SUBCUTANEOUS | 11 refills | Status: DC

## 2017-09-20 MED ORDER — ONDANSETRON HCL 4 MG/2ML IJ SOLN: 4.0000 mg | Freq: Once | INTRAMUSCULAR | Status: DC | PRN

## 2017-09-20 MED ORDER — MOMETASONE FURO-FORMOTEROL FUM 100-5 MCG/ACT IN AERO
2.0000 | INHALATION_SPRAY | Freq: Two times a day (BID) | RESPIRATORY_TRACT | Status: DC
  Filled 2017-09-20: qty 8.8

## 2017-09-20 MED ORDER — INSULIN ASPART 100 UNIT/ML ~~LOC~~ SOLN
10.0000 [IU] | Freq: Once | SUBCUTANEOUS | Status: AC
  Administered 2017-09-20: 10 [IU] via SUBCUTANEOUS

## 2017-09-20 MED ORDER — INSULIN ASPART 100 UNIT/ML ~~LOC~~ SOLN: 0.0000 [IU] | Freq: Every day | SUBCUTANEOUS | 11 refills | Status: DC

## 2017-09-20 MED ORDER — FENTANYL CITRATE (PF) 100 MCG/2ML IJ SOLN
INTRAMUSCULAR | Status: AC
  Filled 2017-09-20: qty 2

## 2017-09-20 MED ORDER — INSULIN ASPART 100 UNIT/ML ~~LOC~~ SOLN: 6.0000 [IU] | Freq: Three times a day (TID) | SUBCUTANEOUS | 11 refills | Status: DC

## 2017-09-20 MED ORDER — INSULIN ASPART 100 UNIT/ML ~~LOC~~ SOLN
SUBCUTANEOUS | Status: AC
  Filled 2017-09-20: qty 1

## 2017-09-20 MED ORDER — INSULIN DETEMIR 100 UNIT/ML ~~LOC~~ SOLN
40.0000 [IU] | Freq: Once | SUBCUTANEOUS | Status: DC
  Filled 2017-09-20: qty 0.4

## 2017-09-20 MED ORDER — PROPOFOL 10 MG/ML IV BOLUS
INTRAVENOUS | Status: DC | PRN
  Administered 2017-09-20 (×2): 10 mg via INTRAVENOUS

## 2017-09-20 MED ORDER — HEPARIN (PORCINE) IN NACL 2-0.9 UNIT/ML-% IJ SOLN
INTRAMUSCULAR | Status: AC
  Filled 2017-09-20: qty 500

## 2017-09-20 MED ORDER — SUCCINYLCHOLINE CHLORIDE 20 MG/ML IJ SOLN
INTRAMUSCULAR | Status: AC
  Filled 2017-09-20: qty 1

## 2017-09-20 MED ORDER — MIDAZOLAM HCL 2 MG/2ML IJ SOLN
INTRAMUSCULAR | Status: DC | PRN
  Administered 2017-09-20: 2 mg via INTRAVENOUS

## 2017-09-20 MED ORDER — INSULIN ASPART 100 UNIT/ML ~~LOC~~ SOLN: 0.0000 [IU] | Freq: Three times a day (TID) | SUBCUTANEOUS | 11 refills | Status: DC

## 2017-09-20 MED ORDER — DEXAMETHASONE SODIUM PHOSPHATE 10 MG/ML IJ SOLN
INTRAMUSCULAR | Status: AC
  Filled 2017-09-20: qty 1

## 2017-09-20 SURGICAL SUPPLY — 4 items
CATH PALINDROME-P 19CM W/VT (CATHETERS) ×2 IMPLANT
GUIDEWIRE SUPER STIFF .035X180 (WIRE) ×2 IMPLANT
PACK ANGIOGRAPHY (CUSTOM PROCEDURE TRAY) ×2 IMPLANT
SUT PROLENE 0 CT 1 30 (SUTURE) ×2 IMPLANT

## 2017-09-20 NOTE — Anesthesia Postprocedure Evaluation (Signed)
Anesthesia Post Note  Patient: [name]  Procedure(s) Performed: DIALYSIS/PERMA CATHETER INSERTION (N/A )  Patient location during evaluation: PACU Anesthesia Type: General Level of consciousness: awake and alert Pain management: pain level controlled Vital Signs Assessment: post-procedure vital signs reviewed and stable Respiratory status: spontaneous breathing, nonlabored ventilation, respiratory function stable and patient connected to nasal cannula oxygen Cardiovascular status: blood pressure returned to baseline and stable Postop Assessment: no apparent nausea or vomiting Anesthetic complications: no     Last Vitals:  Vitals:   09/20/17 1131 09/20/17 1136  BP: 140/62   Pulse: 67   Resp:    Temp: 36.7 C   SpO2: 99% 94%    Last Pain:  Vitals:   09/20/17 1131  TempSrc: Oral  PainSc:                  Molli Barrows

## 2017-09-20 NOTE — Progress Notes (Signed)
Post HD assessment  

## 2017-09-20 NOTE — Progress Notes (Signed)
HD tx end  

## 2017-09-20 NOTE — Anesthesia Procedure Notes (Signed)
Date/Time: 09/20/2017 9:35 AM Performed by: Johnna Acosta, CRNA Pre-anesthesia Checklist: Patient identified, Emergency Drugs available, Suction available, Patient being monitored and Timeout performed Patient Re-evaluated:Patient Re-evaluated prior to induction Oxygen Delivery Method: Simple face mask

## 2017-09-20 NOTE — Progress Notes (Signed)
Pt not currently in room.  My office will arrange outpatient follow up for sleep study.   Marda Stalker, MD.   Board Certified in Internal Medicine, Pulmonary Medicine, [place], and Sleep Medicine.  [place] [facility] Office Number: [phone] Pager: [phone]  Patricia Pesa, M.D.  Merton Border, M.D

## 2017-09-20 NOTE — Transfer of Care (Signed)
Immediate Anesthesia Transfer of Care Note  Patient: Steven [name]  Procedure(s) Performed: DIALYSIS/PERMA CATHETER INSERTION (N/A )  Patient Location: PACU  Anesthesia Type:General  Level of Consciousness: awake, alert  and oriented  Airway & Oxygen Therapy: Patient Spontanous Breathing and Patient connected to nasal cannula oxygen  Post-op Assessment: Report given to RN and Post -op Vital signs reviewed and stable  Post vital signs: Reviewed and stable  Last Vitals:  Vitals:   09/20/17 1022 09/20/17 1023  BP: (!) 166/69 (!) 166/69  Pulse:  72  Resp: 17 (!) 22  Temp:    SpO2: 92% 91%    Last Pain:  Vitals:   09/20/17 0826  TempSrc: Oral  PainSc:          Complications: No apparent anesthesia complications

## 2017-09-20 NOTE — Anesthesia Post-op Follow-up Note (Signed)
Anesthesia QCDR form completed.        

## 2017-09-20 NOTE — Op Note (Signed)
OPERATIVE NOTE   PROCEDURE: 1. Insertion of tunneled dialysis catheter right IJ approach same venous access.  PRE-OPERATIVE DIAGNOSIS: Nonfunction of existing tunneled dialysis catheter, and stage renal disease requiring hemodialysis   POST-OPERATIVE DIAGNOSIS: Same SURGEON: Hortencia Pilar  ANESTHESIA: MAC by Anesthesia  ESTIMATED BLOOD LOSS: Minimal cc  CONTRAST USED:  None  FLUOROSCOPY TIME:  0.7  minutes  INDICATIONS:   Steven [name] is a 56 y.o.y.o. male who presents with poor flow and nonfunction of the tunneled dialysis catheter.  Adequate dialysis has not been possible.  DESCRIPTION: After obtaining full informed written consent, the patient was positioned supine. The right neck and chest wall was prepped and draped in a sterile fashion. The cuff is localized and using blunt and sharp dissection it is freed from the surrounding adhesions.  The existing catheter is then transected proximal to the cuff.  The guidewire is advanced without difficulty under fluoroscopy.  Dilators are passed over the wire as needed and the tunneled dialysis catheter is fed into the central venous system without difficulty.  Under fluoroscopy the catheter tip positioned at the atrial caval junction.  Both lumens aspirate and flush easily. After verification of smooth contour with proper tip position under fluoroscopy the catheter is packed with 5000 units of heparin per lumen.  Catheter secured to the skin of the chest with 0 silk. A sterile dressing is applied with a Biopatch.  COMPLICATIONS: None  CONDITION: Good  Hortencia Pilar [facility] Office:  [phone]   09/20/2017,10:26 AM

## 2017-09-20 NOTE — Care Management Note (Addendum)
Case Management Note  Patient Details  Name: Steven [name] MRN: [id number] Date of Birth: [birth year]  Subjective/Objective:  Spoke with patient while he was in dialysis. Offered home health services and explained how it could benefit him. Patient refused. Updated attending. He feels that the dialysis center provides enough of an assessment. Further explained O2 qualification. He is agreeable to home O2. [facility] is aware of O2 referral.  Made Estill Bamberg, dialysis coordinator aware of discharge today              Action/Plan: [facility] for home O2.  Expected Discharge Date:  09/21/17               Expected Discharge Plan:  Home/Self Care  In-House Referral:     Discharge planning Services  CM Consult  Post Acute Care Choice:  Durable Medical Equipment, Home Health Choice offered to:  Patient  DME Arranged:  Oxygen DME Agency:  [place]:    [facility] Agency:     Status of Service:  Completed, signed off  If discussed at [place] of Stay Meetings, dates discussed:    Additional Comments:  Jolly Mango, RN 09/20/2017, 2:22 PM

## 2017-09-20 NOTE — Progress Notes (Signed)
PRE HD assessment 

## 2017-09-20 NOTE — Progress Notes (Signed)
Performed overnight pulse ox testing on room air per MD order. Test ended after 4 hours. Patient very pleasant. Placed patient on nasal cannula once off test. Discussed cpap usage with patient. He did indicate to me that he has had a cpap in the past with setting of 19. He stated he was not able to wear it at all. He felt as if he was smothering with mask on. He asked me why oxygen alone would not fix problem. I explained oxygen did not provide pressure needed to keep airway patent. However, at this time, oxygen is all that patient wanted. Test downloaded and placed in chart.

## 2017-09-20 NOTE — Progress Notes (Addendum)
Inpatient Diabetes Program Recommendations  AACE/ADA: New Consensus Statement on Inpatient Glycemic Control (2015)  Target Ranges:  Prepandial:   less than 140 mg/dL      Peak postprandial:   less than 180 mg/dL (1-2 hours)      Critically ill patients:  140 - 180 mg/dL   Results for [name], [name] (MRN [id number]) as of 09/20/2017 08:13  Ref. Range 09/18/2017 15:12  Hemoglobin A1C Latest Ref Range: 4.8 - 5.6 % 9.7 (H)   Results for [name], [name] (MRN [id number]) as of 09/20/2017 08:13  Ref. Range 09/19/2017 08:08 09/19/2017 09:03 09/19/2017 10:02 09/19/2017 10:57 09/19/2017 16:15 09/19/2017 21:19  Glucose-Capillary Latest Ref Range: 65 - 99 mg/dL 101 (H) 98 102 (H) 149 (H) 153 (H) 258 (H)   Results for [name], [name] (MRN [id number]) as of 09/20/2017 08:13  Ref. Range 09/20/2017 07:42  Glucose-Capillary Latest Ref Range: 65 - 99 mg/dL 329 (H)    Home DM Meds: Lantus 60 units daily       Novolog 25-100 units TID  Current Insulin Orders: Levemir 34 units daily      Novolog Moderate Correction Scale/ SSI (0-15 units) TID AC + HS      Novolog 6 units TID      MD- Note patient NPO this AM for surgery.  We need to make sure patient gets his basal insulin today either before or immediately after surgery.  CBG this AM elevated to 329 mg/dl.  Also, may consider increasing Levemir to 25 units BID (this would be about 80% total home dose of basal insulin)      --Will follow patient during hospitalization--  Wyn Quaker RN, MSN, CDE Diabetes Coordinator Inpatient Glycemic Control Team Team Pager: [phone] (8a-5p)

## 2017-09-20 NOTE — Anesthesia Preprocedure Evaluation (Signed)
Anesthesia Evaluation  Patient identified by MRN, date of birth, ID band Patient awake    Reviewed: Allergy & Precautions, H&P , NPO status , Patient's Chart, lab work & pertinent test results, reviewed documented beta blocker date and time   Airway Mallampati: IV  TM Distance: <3 FB Neck ROM: full    Dental  (+) Poor Dentition   Pulmonary neg pulmonary ROS, sleep apnea ,    Pulmonary exam normal        Cardiovascular Exercise Tolerance: Poor hypertension, On Medications negative cardio ROS Normal cardiovascular exam Rhythm:regular Rate:Normal     Neuro/Psych negative neurological ROS  negative psych ROS   GI/Hepatic negative GI ROS, Neg liver ROS, GERD  Medicated,  Endo/Other  negative endocrine ROSdiabetes  Renal/GU CRFRenal diseasenegative Renal ROS  negative genitourinary   Musculoskeletal   Abdominal   Peds  Hematology negative hematology ROS (+) anemia ,   Anesthesia Other Findings Past Medical History: No date: Anemia 03/01/2017: Cellulitis and abscess of right lower extremity No date: Chronic kidney disease     Comment:  dialysis m,w,f No date: Diabetes mellitus without complication ([place]) 66/44/0347: Edema extremities     Comment:  bilateral swelling No date: GERD (gastroesophageal reflux disease) No date: High cholesterol No date: High triglycerides No date: Hypertension No date: Neuropathy No date: Sleep apnea     Comment:  can't use cpap d/t feelings of suffocation Past Surgical History: 08/27/2017: A/V FISTULAGRAM; Right     Comment:  Procedure: A/V FISTULAGRAM;  Surgeon: Katha Cabal, MD;  Location: [place] [facility];  Service:               Cardiovascular;  Laterality: Right; 2010: APPENDECTOMY 06/14/2017: AV FISTULA PLACEMENT; Right     Comment:  Procedure: ARTERIOVENOUS (AV) FISTULA CREATION WRIST;                Surgeon: Katha Cabal, MD;  Location: [facility];                 Service: Vascular;  Laterality: Right; 03/05/2017: DIALYSIS/PERMA CATHETER INSERTION; N/A     Comment:  Procedure: Dialysis/Perma Catheter Insertion;  Surgeon:               Katha Cabal, MD;  Location: [place] [facility];               Service: Cardiovascular;  Laterality: N/A; 03/29/2017: EXCHANGE OF A DIALYSIS CATHETER     Comment:  Procedure: Exchange Of A Dialysis Catheter;  Surgeon:               Katha Cabal, MD;  Location: [place] [facility];               Service: Cardiovascular;; 03/01/2017: IRRIGATION AND DEBRIDEMENT FOOT; Right     Comment:  Procedure: IRRIGATION AND DEBRIDEMENT FOOT;  Surgeon:               Albertine Patricia, DPM;  Location: [facility];  Service:               Podiatry;  Laterality: Right;  application of wound vac BMI    Body Mass Index:  41.61 kg/m     Reproductive/Obstetrics negative OB ROS                             Anesthesia  Physical Anesthesia Plan  ASA: III  Anesthesia Plan: General   Post-op Pain Management:    Induction:   PONV Risk Score and Plan:   Airway Management Planned:   Additional Equipment:   Intra-op Plan:   Post-operative Plan:   Informed Consent: I have reviewed the patients History and Physical, chart, labs and discussed the procedure including the risks, benefits and alternatives for the proposed anesthesia with the patient or authorized representative who has indicated his/her understanding and acceptance.   Dental Advisory Given  Plan Discussed with: CRNA  Anesthesia Plan Comments:         Anesthesia Quick Evaluation

## 2017-09-20 NOTE — Progress Notes (Signed)
Patient is being discharged to home this evening. DC and Rx instructions given and patient acknowledged understanding. IV removed, belongings packed. Family is here to take patient home.

## 2017-09-20 NOTE — Progress Notes (Signed)
Pre-hd tx 

## 2017-09-20 NOTE — Discharge Summary (Signed)
Steven [name], is a 56 y.o. male  DOB [birth year]  MRN [id number].  Admission date:  09/18/2017  Admitting Physician  Demetrios Loll, MD  Discharge Date:  09/20/2017   Primary MD  [name], MD  Recommendations for primary care physician for things to follow:  Follow-up with PCP in 1 week Follow-up with dialysis in [place] Monday, Wednesday, Friday.   Admission Diagnosis  Perma Cath Exchange  ESRD   Discharge Diagnosis  Perma Cath Exchange  ESRD    Active Problems:   DKA (diabetic ketoacidoses) ([place])      Past Medical History:  Diagnosis Date  . Anemia   . Cellulitis and abscess of right lower extremity 03/01/2017  . Chronic kidney disease    dialysis m,w,f  . Diabetes mellitus without complication ([place])   . Edema extremities 03/01/2017   bilateral swelling  . GERD (gastroesophageal reflux disease)   . High cholesterol   . High triglycerides   . Hypertension   . Neuropathy   . Sleep apnea    can't use cpap d/t feelings of suffocation    Past Surgical History:  Procedure Laterality Date  . A/V FISTULAGRAM Right 08/27/2017   Procedure: A/V FISTULAGRAM;  Surgeon: Katha Cabal, MD;  Location: [place] [facility];  Service: Cardiovascular;  Laterality: Right;  . APPENDECTOMY  2010  . AV FISTULA PLACEMENT Right 06/14/2017   Procedure: ARTERIOVENOUS (AV) FISTULA CREATION WRIST;  Surgeon: Katha Cabal, MD;  Location: [facility];  Service: Vascular;  Laterality: Right;  . DIALYSIS/PERMA CATHETER INSERTION N/A 03/05/2017   Procedure: Dialysis/Perma Catheter Insertion;  Surgeon: Katha Cabal, MD;  Location: [place] [facility];  Service: Cardiovascular;  Laterality: N/A;  . EXCHANGE OF A DIALYSIS CATHETER  03/29/2017   Procedure: Exchange Of A Dialysis Catheter;  Surgeon: Katha Cabal, MD;   Location: [place] [facility];  Service: Cardiovascular;;  . IRRIGATION AND DEBRIDEMENT FOOT Right 03/01/2017   Procedure: IRRIGATION AND DEBRIDEMENT FOOT;  Surgeon: Albertine Patricia, DPM;  Location: [facility];  Service: Podiatry;  Laterality: Right;  application of wound vac       History of present illness and  Hospital Course:     Kindly see H&P for history of present illness and admission details, please review complete Labs, Consult reports and Test reports for all details in brief  HPI  from the history and physical done on the day of admission 56 year old morbid obese male with history of essential hypertension, diabetes type 2, hyperlipidemia GERD admitted for hyperglycemia, DKA.  Patient also had problems with dialysis, potassium of 6, hypoxia with O2 saturation 80%.   Hospital Course  #1 DKA, blood sugars on admission ; and a gap of 20, blood sugar more than 750.  Admitted to stepdown unit, started on insulin drip,.  Patient also received IV fluids.  Came out of DKA, now decreased to 12, CO2 30 so we transition from insulin drip to Levemir.  Seen by diabetes nurse.  Patient will be on Levemir 25 units twice daily, NovoLog 6 units 3 times daily, NovoLog moderate correction scale with 0-15 units 3 times daily before meals plus at bedtime.  As per diabetes coordinator recommendation. #2 ./severe hyperkalemia with acute respiratory failure, hypoxia secondary to dialysis access problems, missed hemodialysis sessions.  Patient received emergency hemodialysis on admission, had a temporary dialysis catheter, patient AV fistula declotted today, patient went for dialysis today, hyperkalemia, hypoxia resolved.  Patient potassium 5.9 when he came in  to date is 4.3. #3/ morbid obesity, possible sleep apnea: Patient had overnight pulse oximetry with the desaturations less than 89% for 4 hours so he qualifies for nocturnal oxygen 2 L.  Discharge Condition: Stable   Follow UP  Follow-up  Information    [name], MD. Go on 09/27/2017.   Specialty:  Internal Medicine Why:  at 4:30PM Contact information: [address] [phone]             Discharge Instructions  and  Discharge Medications      Allergies as of 09/20/2017      Reactions   Codeine Nausea And Vomiting      Medication List    STOP taking these medications   HYDROcodone-acetaminophen 5-325 MG tablet Commonly known as:  NORCO     TAKE these medications   acarbose 100 MG tablet Commonly known as:  PRECOSE Take 100 mg 3 (three) times daily with meals by mouth.   accu-chek multiclix lancets Use as instructed.  check blood sugar up to 3 times a day Disp:  1 box   aspirin EC 81 MG tablet TAKE ONE  BY MOUTH EVERY DAY   furosemide 80 MG tablet Commonly known as:  LASIX Take 1 tablet (80 mg total) by mouth daily. What changed:    how much to take  when to take this   gabapentin 300 MG capsule Commonly known as:  NEURONTIN Take 1 capsule (300 mg total) by mouth 2 (two) times daily.   glucose blood test strip Commonly known as:  ACCU-CHEK AVIVA Use as instructed.use to check blood sugar up to 3 times a day  Disp: 1 box   insulin aspart 100 UNIT/ML injection Commonly known as:  novoLOG Inject 0-5 Units into the skin at bedtime. What changed:    how much to take  when to take this   insulin aspart 100 UNIT/ML injection Commonly known as:  novoLOG Inject 6 Units into the skin 3 (three) times daily with meals. What changed:  You were already taking a medication with the same name, and this prescription was added. Make sure you understand how and when to take each.   insulin aspart 100 UNIT/ML injection Commonly known as:  novoLOG Inject 0-15 Units into the skin 3 (three) times daily with meals. What changed:  You were already taking a medication with the same name, and this prescription was added. Make sure you understand how and when to take  each.   insulin detemir 100 UNIT/ML injection Commonly known as:  LEVEMIR Inject 0.25 mLs (25 Units total) into the skin 2 (two) times daily. Start taking on:  09/21/2017 What changed:    how much to take  when to take this   Insulin Pen Needle 31G X 8 MM Misc 1 Container by Does not apply route QID. qid--any brand   Insulin Syringes (Disposable) U-100 1 ML Misc Use as directed with insulin   losartan 25 MG tablet Commonly known as:  COZAAR Take 25 mg daily by mouth.   metoprolol tartrate 25 MG tablet Commonly known as:  LOPRESSOR Take 1 tablet (25 mg total) by mouth 2 (two) times daily.   multivitamin with minerals Tabs tablet Take 1 tablet by mouth daily.   omeprazole 20 MG tablet Commonly known as:  PRILOSEC OTC Take 20 mg every other day by mouth.   oxymetazoline 0.05 % nasal spray Commonly known as:  AFRIN Place 1 spray into both nostrils  daily as needed for congestion.   PROAIR HFA 108 (90 Base) MCG/ACT inhaler Generic drug:  albuterol Inhale 2 puffs into the lungs every 4 (four) hours as needed.   SYMBICORT 80-4.5 MCG/ACT inhaler Generic drug:  budesonide-formoterol Inhale 1 puff into the lungs 2 (two) times daily.            Durable Medical Equipment  (From admission, onward)        Start     Ordered   09/20/17 1341  For home use only DME oxygen  Once    Question Answer Comment  Mode or (Route) Nasal cannula   Liters per Minute 2   Frequency Only at night (stationary unit needed)   Oxygen conserving device Yes   Oxygen delivery system Gas      09/20/17 1341        Diet and Activity recommendation: See Discharge Instructions above   Consults obtained -diabetic nurse, critical care, nephrology for respiratory therapy   Major procedures and Radiology Reports - PLEASE review detailed and final reports for all details, in brief -     Portable Chest X-ray (1 View)  Result Date: 09/18/2017 CLINICAL DATA:  Shortness of breath. EXAM:  PORTABLE CHEST 1 VIEW COMPARISON:  Radiograph of March 29, 2017 FINDINGS: The heart size and mediastinal contours are within normal limits. Both lungs are clear. Right internal jugular dialysis catheter is noted with distal tip in expected position of cavoatrial junction. No pneumothorax or pleural effusion is noted. The visualized skeletal structures are unremarkable. IMPRESSION: No acute cardiopulmonary abnormality seen. Electronically Signed   By: Marijo Conception, M.D.   On: 09/18/2017 14:12    Micro Results     Recent Results (from the past 240 hour(s))  MRSA PCR Screening     Status: None   Collection Time: 09/18/17  2:34 PM  Result Value Ref Range Status   MRSA by PCR NEGATIVE NEGATIVE Final    Comment:        The GeneXpert MRSA Assay (FDA approved for NASAL specimens only), is one component of a comprehensive MRSA colonization surveillance program. It is not intended to diagnose MRSA infection nor to guide or monitor treatment for MRSA infections.        Today   Subjective:   Steven [name] today has no headache,no chest abdominal pain,no new weakness tingling or numbness, feels much better wants to go home today.   Objective:   Blood pressure 140/64, pulse 66, temperature 98.6 F (37 C), temperature source Oral, resp. rate 14, height 5\' 10"  (1.778 m), weight 131.5 kg (290 lb), SpO2 91 %.   Intake/Output Summary (Last 24 hours) at 09/20/2017 1354 Last data filed at 09/20/2017 1127 Gross per 24 hour  Intake 435 ml  Output 805 ml  Net -370 ml    Exam Awake Alert, Oriented x 3, No new F.N deficits, Normal affect [place].AT,PERRAL Supple Neck,No JVD, No cervical lymphadenopathy appriciated.  Symmetrical Chest wall movement, Good air movement bilaterally, CTAB RRR,No Gallops,Rubs or new Murmurs, No Parasternal Heave +ve B.Sounds, Abd Soft, Non tender, No organomegaly appriciated, No rebound -guarding or rigidity. No Cyanosis, Clubbing or edema, No new Rash or  bruise  Data Review   CBC w Diff:  Lab Results  Component Value Date   WBC 5.6 09/19/2017   HGB 12.7 (L) 09/19/2017   HCT 37.4 (L) 09/19/2017   PLT 181 09/19/2017   LYMPHOPCT 6 02/27/2017   MONOPCT 8 02/27/2017   EOSPCT 2 02/27/2017  BASOPCT 1 02/27/2017    CMP:  Lab Results  Component Value Date   NA 134 (L) 09/20/2017   K 4.3 09/20/2017   CL 94 (L) 09/20/2017   CO2 27 09/20/2017   BUN 58 (H) 09/20/2017   CREATININE 7.42 (H) 09/20/2017   CREATININE 1.41 (H) 12/07/2011   PROT 7.9 05/23/2017   ALBUMIN 3.9 05/23/2017   BILITOT 0.5 05/23/2017   ALKPHOS 107 05/23/2017   AST 17 05/23/2017   ALT 22 05/23/2017  .   Total Time in preparing paper work, data evaluation and todays exam - 35 minutes  Epifanio Lesches M.D on 09/20/2017 at 1:54 PM    Note: This dictation was prepared with Dragon dictation along with smaller phrase technology.  Follow-up with PCP in 1 week  Any transcriptional errors that result from this process are unintentional.

## 2017-09-20 NOTE — Progress Notes (Signed)
disCussed with patient about oxygen needed at night, he agreed for oxygen at night, patient seen during hemodialysis.  Told him that he can be discharged after dialysis today.

## 2017-09-20 NOTE — Progress Notes (Signed)
[facility]  ROUNDING NOTE   Subjective:  Patient seen at bedside. He did have hemodialysis yesterday. He is due for dialysis again today.   Objective:  Vital signs in last 24 hours:  Temp:  [97.8 F (36.6 C)-98.7 F (37.1 C)] 98.6 F (37 C) (12/14 1241) Pulse Rate:  [57-83] 66 (12/14 1241) Resp:  [12-22] 14 (12/14 1100) BP: (111-166)/(54-86) 140/64 (12/14 1241) SpO2:  [90 %-100 %] 91 % (12/14 1241) Weight:  [131.5 kg (290 lb)] 131.5 kg (290 lb) (12/14 0826)  Weight change:  Filed Weights   09/18/17 1815 09/18/17 2352 09/20/17 0826  Weight: 131.9 kg (290 lb 12.6 oz) 131.8 kg (290 lb 9.1 oz) 131.5 kg (290 lb)    Intake/Output: I/O last 3 completed shifts: In: 29 [P.O.:360; I.V.:288] Out: -67 [Urine:350]   Intake/Output this shift:  Total I/O In: 75 [I.V.:75] Out: 805 [Urine:800; Blood:5]  Physical Exam: General: No acute distress  Head: Normocephalic, atraumatic. Moist oral mucosal membranes  Eyes: Anicteric  Neck: Supple, trachea midline  Lungs:  Clear to auscultation, normal effort  Heart: S1S2 no rubs  Abdomen:  Soft, nontender, bowel sounds present  Extremities: Trace peripheral edema.  Neurologic: Awake, alert, following commands  Skin: No lesions  Access: RIJ PC in place    Basic Metabolic Panel: Recent Labs  Lab 09/18/17 1512 09/18/17 2045 09/19/17 0159 09/19/17 0551 09/20/17 0344  NA 130* 135 135 137 134*  K 5.9* 4.1 3.7 3.6 4.3  CL 90* 94* 94* 95* 94*  CO2 20* 24 27 30 27   GLUCOSE 707* 316* 242* 180* 290*  BUN 94* 91* 49* 52* 58*  CREATININE 9.97* 10.11* 6.40* 6.78* 7.42*  CALCIUM 8.9 9.1 8.6* 8.6* 8.3*  MG 2.6*  --   --   --   --   PHOS 7.3* 6.5*  --   --   --     Liver Function Tests: No results for input(s): AST, ALT, ALKPHOS, BILITOT, PROT, ALBUMIN in the last 168 hours. No results for input(s): LIPASE, AMYLASE in the last 168 hours. No results for input(s): AMMONIA in the last 168 hours.  CBC: Recent Labs  Lab  09/18/17 1205 09/19/17 0159  WBC 5.8 5.6  HGB 13.6 12.7*  HCT 41.8 37.4*  MCV 98.0 93.1  PLT 203 181    Cardiac Enzymes: No results for input(s): CKTOTAL, CKMB, CKMBINDEX, TROPONINI in the last 168 hours.  BNP: Invalid input(s): POCBNP  CBG: Recent Labs  Lab 09/19/17 2119 09/20/17 0742 09/20/17 0834 09/20/17 1027 09/20/17 1143  GLUCAP 258* 329* 326* 349* 302*    Microbiology: Results for orders placed or performed during the hospital encounter of 09/18/17  MRSA PCR Screening     Status: None   Collection Time: 09/18/17  2:34 PM  Result Value Ref Range Status   MRSA by PCR NEGATIVE NEGATIVE Final    Comment:        The GeneXpert MRSA Assay (FDA approved for NASAL specimens only), is one component of a comprehensive MRSA colonization surveillance program. It is not intended to diagnose MRSA infection nor to guide or monitor treatment for MRSA infections.     Coagulation Studies: No results for input(s): LABPROT, INR in the last 72 hours.  Urinalysis: No results for input(s): COLORURINE, LABSPEC, PHURINE, GLUCOSEU, HGBUR, BILIRUBINUR, KETONESUR, PROTEINUR, UROBILINOGEN, NITRITE, LEUKOCYTESUR in the last 72 hours.  Invalid input(s): APPERANCEUR    Imaging: Portable Chest X-ray (1 View)  Result Date: 09/18/2017 CLINICAL DATA:  Shortness of breath.  EXAM: PORTABLE CHEST 1 VIEW COMPARISON:  Radiograph of March 29, 2017 FINDINGS: The heart size and mediastinal contours are within normal limits. Both lungs are clear. Right internal jugular dialysis catheter is noted with distal tip in expected position of cavoatrial junction. No pneumothorax or pleural effusion is noted. The visualized skeletal structures are unremarkable. IMPRESSION: No acute cardiopulmonary abnormality seen. Electronically Signed   By: Marijo Conception, M.D.   On: 09/18/2017 14:12     Medications:   . sodium chloride    . sodium chloride Stopped (09/18/17 2200)  . sodium chloride    . sodium  chloride     . aspirin EC  81 mg Oral Daily  . furosemide  160 mg Oral BID  . gabapentin  300 mg Oral BID  . heparin  5,000 Units Subcutaneous Q8H  . insulin aspart      . insulin aspart  0-15 Units Subcutaneous TID WC  . insulin aspart  0-5 Units Subcutaneous QHS  . insulin aspart  6 Units Subcutaneous TID WC  . insulin detemir  34 Units Subcutaneous Daily  . losartan  25 mg Oral Daily  . metoprolol tartrate  25 mg Oral BID  . midazolam  10 mg Oral Once  . multivitamin  1 tablet Oral QHS  . pantoprazole  40 mg Oral Daily  . sodium chloride flush  3 mL Intravenous Q12H   sodium chloride, sodium chloride, sodium chloride, acetaminophen **OR** acetaminophen, albuterol, heparin, hydrALAZINE, HYDROcodone-acetaminophen, HYDROmorphone (DILAUDID) injection, lidocaine (PF), lidocaine-prilocaine, ondansetron (ZOFRAN) IV, oxymetazoline, pentafluoroprop-tetrafluoroeth, senna-docusate, sodium chloride flush  Assessment/ Plan:  56 y.o. male with hypertension, diabetes mellitus type 2 insulin dependent, hyperlipidemia, GERD, and morbid obesity  [facility]/MWF  1. 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  We will plan for dialysis again today.  Orders have been prepared.  2. Hypertension: Blood pressure currently under good control at 140/64.  Continue losartan, metoprolol.  3. Anemia of chronic kidney disease: hgb currently 12.7, hold off on epogen.   4. Secondary Hyperparathyroidism: We will plan to check serum phosphorus with dialysis today.   LOS: 2 Kathalina Ostermann 12/14/20181:13 PM

## 2017-09-23 DIAGNOSIS — D631 Anemia in chronic kidney disease: Secondary | ICD-10-CM | POA: Diagnosis not present

## 2017-09-23 DIAGNOSIS — D509 Iron deficiency anemia, unspecified: Secondary | ICD-10-CM | POA: Diagnosis not present

## 2017-09-23 DIAGNOSIS — Z23 Encounter for immunization: Secondary | ICD-10-CM | POA: Diagnosis not present

## 2017-09-23 DIAGNOSIS — Z992 Dependence on renal dialysis: Secondary | ICD-10-CM | POA: Diagnosis not present

## 2017-09-23 DIAGNOSIS — N186 End stage renal disease: Secondary | ICD-10-CM | POA: Diagnosis not present

## 2017-09-23 DIAGNOSIS — Z1159 Encounter for screening for other viral diseases: Secondary | ICD-10-CM | POA: Diagnosis not present

## 2017-09-24 ENCOUNTER — Telehealth: Payer: Self-pay

## 2017-09-24 NOTE — Telephone Encounter (Signed)
Lmov for patient to call and schedule appointment   Will try again at a later time

## 2017-09-24 NOTE — Telephone Encounter (Signed)
-----   Message from [facility] [place] sent at [id number]  2:50 PM EST ----- Regarding: FW: hfu Please schedule pt for a sleep consult in a 30 minute slot with DR for next week.Marland Kitchen  Thanks, Misty ----- Message ----- From: Laverle Hobby, MD Sent: 09/20/2017   1:41 PM To: [facility] Clinical Pool Subject: hfu                                            Pt needs outpt follow up next week for possible sleep apnea.

## 2017-09-25 DIAGNOSIS — Z992 Dependence on renal dialysis: Secondary | ICD-10-CM | POA: Diagnosis not present

## 2017-09-25 DIAGNOSIS — D631 Anemia in chronic kidney disease: Secondary | ICD-10-CM | POA: Diagnosis not present

## 2017-09-25 DIAGNOSIS — N186 End stage renal disease: Secondary | ICD-10-CM | POA: Diagnosis not present

## 2017-09-25 DIAGNOSIS — D509 Iron deficiency anemia, unspecified: Secondary | ICD-10-CM | POA: Diagnosis not present

## 2017-09-25 DIAGNOSIS — Z23 Encounter for immunization: Secondary | ICD-10-CM | POA: Diagnosis not present

## 2017-09-27 DIAGNOSIS — D631 Anemia in chronic kidney disease: Secondary | ICD-10-CM | POA: Diagnosis not present

## 2017-09-27 DIAGNOSIS — Z23 Encounter for immunization: Secondary | ICD-10-CM | POA: Diagnosis not present

## 2017-09-27 DIAGNOSIS — Z992 Dependence on renal dialysis: Secondary | ICD-10-CM | POA: Diagnosis not present

## 2017-09-27 DIAGNOSIS — N186 End stage renal disease: Secondary | ICD-10-CM | POA: Diagnosis not present

## 2017-09-27 DIAGNOSIS — D509 Iron deficiency anemia, unspecified: Secondary | ICD-10-CM | POA: Diagnosis not present

## 2017-09-27 NOTE — Telephone Encounter (Signed)
lmov to schedule appt  °

## 2017-09-29 DIAGNOSIS — Z23 Encounter for immunization: Secondary | ICD-10-CM | POA: Diagnosis not present

## 2017-09-29 DIAGNOSIS — Z992 Dependence on renal dialysis: Secondary | ICD-10-CM | POA: Diagnosis not present

## 2017-09-29 DIAGNOSIS — D631 Anemia in chronic kidney disease: Secondary | ICD-10-CM | POA: Diagnosis not present

## 2017-09-29 DIAGNOSIS — N186 End stage renal disease: Secondary | ICD-10-CM | POA: Diagnosis not present

## 2017-09-29 DIAGNOSIS — D509 Iron deficiency anemia, unspecified: Secondary | ICD-10-CM | POA: Diagnosis not present

## 2017-10-02 ENCOUNTER — Ambulatory Visit (INDEPENDENT_AMBULATORY_CARE_PROVIDER_SITE_OTHER): Payer: Medicare Other | Admitting: Vascular Surgery

## 2017-10-02 ENCOUNTER — Encounter (INDEPENDENT_AMBULATORY_CARE_PROVIDER_SITE_OTHER): Payer: Self-pay | Admitting: Vascular Surgery

## 2017-10-02 ENCOUNTER — Ambulatory Visit (INDEPENDENT_AMBULATORY_CARE_PROVIDER_SITE_OTHER): Payer: Medicare Other

## 2017-10-02 VITALS — BP 122/71 | HR 85 | Resp 19 | Ht 70.5 in | Wt 298.0 lb

## 2017-10-02 DIAGNOSIS — Z23 Encounter for immunization: Secondary | ICD-10-CM | POA: Diagnosis not present

## 2017-10-02 DIAGNOSIS — N186 End stage renal disease: Secondary | ICD-10-CM

## 2017-10-02 DIAGNOSIS — T8090XS Unspecified complication following infusion and therapeutic injection, sequela: Secondary | ICD-10-CM

## 2017-10-02 DIAGNOSIS — Z992 Dependence on renal dialysis: Secondary | ICD-10-CM | POA: Diagnosis not present

## 2017-10-02 DIAGNOSIS — D509 Iron deficiency anemia, unspecified: Secondary | ICD-10-CM | POA: Diagnosis not present

## 2017-10-02 DIAGNOSIS — D631 Anemia in chronic kidney disease: Secondary | ICD-10-CM | POA: Diagnosis not present

## 2017-10-02 NOTE — Telephone Encounter (Signed)
Pt scheduled 10/22/17   Nothing else needed

## 2017-10-02 NOTE — Progress Notes (Signed)
Subjective:    Patient ID: Steven [name], male    DOB: [birth year], 56 y.o.   MRN: [id number] Chief Complaint  Patient presents with  . Routine Post Op    3 week follow up   Patient presents for his first post procedure follow-up.  The patient is status post a creation of a right radiocephalic AV fistula on June 14, 2017.  The patient followed up in our office postoperatively and was instructed to start using his fistula however states the "head nurse" at his dialysis center stated "she knew more than our practice"and felt his fistula was not ready to use.  He underwent a fistulogram scheduled by the dialysis center on August 27, 2017.  The patient is currently being maintained by a right IJ PermCath without issue.  His right radiocephalic AV fistula has not been cannulated yet.  The patient underwent an HDA which is notable for a patent right radiocephalic AV fistula anastomosis.  The patient denies any right upper extremity pain or ulceration.  The patient denies any fever, nausea or vomiting.   Review of Systems  Constitutional: Negative.   HENT: Negative.   Eyes: Negative.   Respiratory: Negative.   Cardiovascular: Negative.   Gastrointestinal: Negative.   Endocrine: Negative.   Genitourinary: Negative.   Musculoskeletal: Negative.   Skin: Negative.   Allergic/Immunologic: Negative.   Neurological: Negative.   Hematological: Negative.   Psychiatric/Behavioral: Negative.       Objective:   Physical Exam  Constitutional: He is oriented to person, place, and time. He appears well-developed and well-nourished. No distress.  HENT:  Head: Normocephalic and atraumatic.  Eyes: Conjunctivae are normal. Pupils are equal, round, and reactive to light.  Neck: Normal range of motion.  Cardiovascular: Normal rate, regular rhythm, normal heart sounds and intact distal pulses.  Pulses:      Radial pulses are 2+ on the right side, and 2+ on the left side.  Right radiocephalic AV  fistula: Bruit and thrill.  Able to palpate thrill all the way up the arm.  Pulmonary/Chest: Effort normal and breath sounds normal.  Musculoskeletal: Normal range of motion.  Neurological: He is alert and oriented to person, place, and time.  Skin: Skin is warm and dry. He is not diaphoretic.  Psychiatric: He has a normal mood and affect. His behavior is normal. Judgment and thought content normal.  Vitals reviewed.  BP 122/71 (BP Location: Left Arm, Patient Position: Sitting)   Pulse 85   Resp 19   Ht 5' 10.5" (1.791 m)   Wt 298 lb (135.2 kg)   BMI 42.15 kg/m   Past Medical History:  Diagnosis Date  . Anemia   . Cellulitis and abscess of right lower extremity 03/01/2017  . Chronic kidney disease    dialysis m,w,f  . Diabetes mellitus without complication ([place])   . Edema extremities 03/01/2017   bilateral swelling  . GERD (gastroesophageal reflux disease)   . High cholesterol   . High triglycerides   . Hypertension   . Neuropathy   . Sleep apnea    can't use cpap d/t feelings of suffocation   Social History   Socioeconomic History  . Marital status: Divorced    Spouse name: Not on file  . Number of children: Not on file  . Years of education: Not on file  . Highest education level: Not on file  Social Needs  . Financial resource strain: Not on file  . Food insecurity - worry: Not on  file  . Food insecurity - inability: Not on file  . Transportation needs - medical: Not on file  . Transportation needs - non-medical: Not on file  Occupational History  . Not on file  Tobacco Use  . Smoking status: Never Smoker  . Smokeless tobacco: Current User    Types: Snuff  Substance and Sexual Activity  . 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 Drug use: No  . Sexual activity: No  Other Topics Concern  . Not on file  Social History Narrative  . Not on file   Past Surgical History:  Procedure Laterality Date  . A/V FISTULAGRAM  Right 08/27/2017   Procedure: A/V FISTULAGRAM;  Surgeon: Katha Cabal, MD;  Location: [facility];  Service: Cardiovascular;  Laterality: Right;  . APPENDECTOMY  2010  . AV FISTULA PLACEMENT Right 06/14/2017   Procedure: ARTERIOVENOUS (AV) FISTULA CREATION WRIST;  Surgeon: Katha Cabal, MD;  Location: [facility];  Service: Vascular;  Laterality: Right;  . DIALYSIS/PERMA CATHETER INSERTION N/A 03/05/2017   Procedure: Dialysis/Perma Catheter Insertion;  Surgeon: Katha Cabal, MD;  Location: [facility];  Service: Cardiovascular;  Laterality: N/A;  . DIALYSIS/PERMA CATHETER INSERTION N/A 09/20/2017   Procedure: DIALYSIS/PERMA CATHETER INSERTION;  Surgeon: Katha Cabal, MD;  Location: [facility];  Service: Cardiovascular;  Laterality: N/A;  . EXCHANGE OF A DIALYSIS CATHETER  03/29/2017   Procedure: Exchange Of A Dialysis Catheter;  Surgeon: Katha Cabal, MD;  Location: [facility];  Service: Cardiovascular;;  . IRRIGATION AND DEBRIDEMENT FOOT Right 03/01/2017   Procedure: IRRIGATION AND DEBRIDEMENT FOOT;  Surgeon: Albertine Patricia, DPM;  Location: [facility];  Service: Podiatry;  Laterality: Right;  application of wound vac   Family History  Problem Relation Age of Onset  . CAD Father    Allergies  Allergen Reactions  . Codeine Nausea And Vomiting      Assessment & Plan:  Patient presents for his first post procedure follow-up.  The patient is status post a creation of a right radiocephalic AV fistula on June 14, 2017.  The patient followed up in our office postoperatively and was instructed to start using his fistula however states the "head nurse" at his dialysis center stated "she knew more than our practice"and felt his fistula was not ready to use.  He underwent a fistulogram scheduled by the dialysis center on August 27, 2017.  The patient is currently being maintained by a right IJ PermCath without issue.  His right  radiocephalic AV fistula has not been cannulated yet.  The patient underwent an HDA which is notable for a patent right radiocephalic AV fistula anastomosis.  The patient denies any right upper extremity pain or ulceration.  The patient denies any fever, nausea or vomiting.  1. End stage renal disease ([place]) - Stable Patient with a creation of a right radiocephalic AV fistula on June 14, 2017 Even though the patient was given permission to start dialysis through his fistula months ago, the head nurse at his dialysis center felt his fistula was not ready refused to cannulate it. The patient has been maintained by a right IJ PermCath without issue The patient underwent an HDA which showed a patent radiocephalic AV fistula Physical exam with a good bruit and thrill unable to palpate thrill all the way up the right arm Patient was given another letter to start dialysis through his right  radial cephalic AV fistula If the nurse continues to refuse to cannulated the patient was instructed to tell the nurse to please call our office. The patient was encouraged to follow-up in 6 months and undergo a surveillance HDA  - VAS US DUPLEX DIALYSIS ACCESS (AVF,AVG); Future  2. Complication of renal dialysis, sequela - Stable Patient is currently being maintained by right IJ Once the patient has achieved successful hemodialysis through his right radiocephalic AV fistula we will plan on removing his right IJ PermCath As above  Current Outpatient Medications on File Prior to Visit  Medication Sig Dispense Refill  . acarbose (PRECOSE) 100 MG tablet Take 100 mg 3 (three) times daily with meals by mouth.    Marland Kitchen aspirin EC 81 MG tablet TAKE ONE  BY MOUTH EVERY DAY 30 tablet 10  . B Complex-C-Zn-Folic Acid (DIALYVITE/ZINC) TABS Take 1 tablet by mouth daily.  0  . furosemide (LASIX) 80 MG tablet Take 1 tablet (80 mg total) by mouth daily. (Patient taking differently: Take 160 mg by mouth 2 (two) times daily. ) 30  tablet 0  . gabapentin (NEURONTIN) 300 MG capsule Take 1 capsule (300 mg total) by mouth 2 (two) times daily. 60 capsule 0  . glucose blood (ACCU-CHEK AVIVA) test strip Use as instructed.use to check blood sugar up to 3 times a day  Disp: 1 box 100 each 11  . insulin aspart (NOVOLOG) 100 UNIT/ML injection Inject 0-5 Units into the skin at bedtime. 10 mL 11  . insulin aspart (NOVOLOG) 100 UNIT/ML injection Inject 6 Units into the skin 3 (three) times daily with meals. 10 mL 11  . insulin aspart (NOVOLOG) 100 UNIT/ML injection Inject 0-15 Units into the skin 3 (three) times daily with meals. 10 mL 11  . insulin detemir (LEVEMIR) 100 UNIT/ML injection Inject 0.25 mLs (25 Units total) into the skin 2 (two) times daily. 10 mL 11  . Insulin Pen Needle 31G X 8 MM MISC 1 Container by Does not apply route QID. qid--any brand 100 each 11  . INSULIN SYRINGE 1CC/29G 29G X 1/2" 1 ML MISC Use as directed with insulin    . Insulin Syringes, Disposable, U-100 1 ML MISC Use as directed with insulin 100 each 3  . Lancets (ACCU-CHEK MULTICLIX) lancets Use as instructed.  check blood sugar up to 3 times a day Disp:  1 box 100 each 11  . lisinopril (PRINIVIL,ZESTRIL) 40 MG tablet Take by mouth.    . losartan (COZAAR) 25 MG tablet Take 25 mg daily by mouth.    . metoprolol tartrate (LOPRESSOR) 25 MG tablet Take 1 tablet (25 mg total) by mouth 2 (two) times daily. 60 tablet 0  . Multiple Vitamin (MULTIVITAMIN WITH MINERALS) TABS tablet Take 1 tablet by mouth daily.    Marland Kitchen omeprazole (PRILOSEC OTC) 20 MG tablet Take 20 mg every other day by mouth.    Marland Kitchen oxymetazoline (AFRIN) 0.05 % nasal spray Place 1 spray into both nostrils daily as needed for congestion.    Marland Kitchen PROAIR HFA 108 (90 Base) MCG/ACT inhaler Inhale 2 puffs into the lungs every 4 (four) hours as needed.  0  . SYMBICORT 80-4.5 MCG/ACT inhaler Inhale 1 puff into the lungs 2 (two) times daily.   0   No current facility-administered medications on file prior to  visit.    There are no Patient Instructions on file for this visit. No Follow-up on file.  Makaiah Terwilliger A Haven Pylant, PA-C

## 2017-10-04 DIAGNOSIS — D509 Iron deficiency anemia, unspecified: Secondary | ICD-10-CM | POA: Diagnosis not present

## 2017-10-04 DIAGNOSIS — N186 End stage renal disease: Secondary | ICD-10-CM | POA: Diagnosis not present

## 2017-10-04 DIAGNOSIS — Z23 Encounter for immunization: Secondary | ICD-10-CM | POA: Diagnosis not present

## 2017-10-04 DIAGNOSIS — Z992 Dependence on renal dialysis: Secondary | ICD-10-CM | POA: Diagnosis not present

## 2017-10-04 DIAGNOSIS — D631 Anemia in chronic kidney disease: Secondary | ICD-10-CM | POA: Diagnosis not present

## 2017-10-07 DIAGNOSIS — Z992 Dependence on renal dialysis: Secondary | ICD-10-CM | POA: Diagnosis not present

## 2017-10-07 DIAGNOSIS — D509 Iron deficiency anemia, unspecified: Secondary | ICD-10-CM | POA: Diagnosis not present

## 2017-10-07 DIAGNOSIS — D631 Anemia in chronic kidney disease: Secondary | ICD-10-CM | POA: Diagnosis not present

## 2017-10-07 DIAGNOSIS — N186 End stage renal disease: Secondary | ICD-10-CM | POA: Diagnosis not present

## 2017-10-07 DIAGNOSIS — Z23 Encounter for immunization: Secondary | ICD-10-CM | POA: Diagnosis not present

## 2017-10-10 DIAGNOSIS — D509 Iron deficiency anemia, unspecified: Secondary | ICD-10-CM | POA: Diagnosis not present

## 2017-10-10 DIAGNOSIS — Z992 Dependence on renal dialysis: Secondary | ICD-10-CM | POA: Diagnosis not present

## 2017-10-10 DIAGNOSIS — N186 End stage renal disease: Secondary | ICD-10-CM | POA: Diagnosis not present

## 2017-10-10 DIAGNOSIS — N2581 Secondary hyperparathyroidism of renal origin: Secondary | ICD-10-CM | POA: Diagnosis not present

## 2017-10-11 DIAGNOSIS — D509 Iron deficiency anemia, unspecified: Secondary | ICD-10-CM | POA: Diagnosis not present

## 2017-10-11 DIAGNOSIS — N186 End stage renal disease: Secondary | ICD-10-CM | POA: Diagnosis not present

## 2017-10-11 DIAGNOSIS — N2581 Secondary hyperparathyroidism of renal origin: Secondary | ICD-10-CM | POA: Diagnosis not present

## 2017-10-11 DIAGNOSIS — Z992 Dependence on renal dialysis: Secondary | ICD-10-CM | POA: Diagnosis not present

## 2017-10-15 DIAGNOSIS — D509 Iron deficiency anemia, unspecified: Secondary | ICD-10-CM | POA: Diagnosis not present

## 2017-10-15 DIAGNOSIS — Z992 Dependence on renal dialysis: Secondary | ICD-10-CM | POA: Diagnosis not present

## 2017-10-15 DIAGNOSIS — N186 End stage renal disease: Secondary | ICD-10-CM | POA: Diagnosis not present

## 2017-10-15 DIAGNOSIS — N2581 Secondary hyperparathyroidism of renal origin: Secondary | ICD-10-CM | POA: Diagnosis not present

## 2017-10-18 DIAGNOSIS — D509 Iron deficiency anemia, unspecified: Secondary | ICD-10-CM | POA: Diagnosis not present

## 2017-10-18 DIAGNOSIS — N2581 Secondary hyperparathyroidism of renal origin: Secondary | ICD-10-CM | POA: Diagnosis not present

## 2017-10-18 DIAGNOSIS — N186 End stage renal disease: Secondary | ICD-10-CM | POA: Diagnosis not present

## 2017-10-18 DIAGNOSIS — Z992 Dependence on renal dialysis: Secondary | ICD-10-CM | POA: Diagnosis not present

## 2017-10-21 DIAGNOSIS — Z794 Long term (current) use of insulin: Secondary | ICD-10-CM | POA: Diagnosis not present

## 2017-10-21 DIAGNOSIS — E119 Type 2 diabetes mellitus without complications: Secondary | ICD-10-CM | POA: Diagnosis not present

## 2017-10-21 DIAGNOSIS — N2581 Secondary hyperparathyroidism of renal origin: Secondary | ICD-10-CM | POA: Diagnosis not present

## 2017-10-21 DIAGNOSIS — N186 End stage renal disease: Secondary | ICD-10-CM | POA: Diagnosis not present

## 2017-10-21 DIAGNOSIS — Z992 Dependence on renal dialysis: Secondary | ICD-10-CM | POA: Diagnosis not present

## 2017-10-21 DIAGNOSIS — D509 Iron deficiency anemia, unspecified: Secondary | ICD-10-CM | POA: Diagnosis not present

## 2017-10-22 ENCOUNTER — Institutional Professional Consult (permissible substitution): Payer: Medicare Other | Admitting: Internal Medicine

## 2017-10-23 DIAGNOSIS — D509 Iron deficiency anemia, unspecified: Secondary | ICD-10-CM | POA: Diagnosis not present

## 2017-10-23 DIAGNOSIS — N2581 Secondary hyperparathyroidism of renal origin: Secondary | ICD-10-CM | POA: Diagnosis not present

## 2017-10-23 DIAGNOSIS — N186 End stage renal disease: Secondary | ICD-10-CM | POA: Diagnosis not present

## 2017-10-23 DIAGNOSIS — Z992 Dependence on renal dialysis: Secondary | ICD-10-CM | POA: Diagnosis not present

## 2017-10-23 NOTE — Progress Notes (Deleted)
Patient is a 57 year old male end-stage renal disease on hemodialysis, recently admitted to the hospital for DKA.  He is referred now for possible obstructive sleep apnea evaluation.

## 2017-10-24 ENCOUNTER — Institutional Professional Consult (permissible substitution): Payer: Medicare Other | Admitting: Internal Medicine

## 2017-10-24 ENCOUNTER — Encounter: Payer: Self-pay | Admitting: Internal Medicine

## 2017-10-24 ENCOUNTER — Ambulatory Visit (INDEPENDENT_AMBULATORY_CARE_PROVIDER_SITE_OTHER): Payer: Medicare Other | Admitting: Internal Medicine

## 2017-10-24 VITALS — BP 136/76 | HR 74 | Resp 16 | Ht 70.5 in | Wt 299.0 lb

## 2017-10-24 DIAGNOSIS — J984 Other disorders of lung: Secondary | ICD-10-CM | POA: Diagnosis not present

## 2017-10-24 DIAGNOSIS — G4733 Obstructive sleep apnea (adult) (pediatric): Secondary | ICD-10-CM | POA: Diagnosis not present

## 2017-10-24 NOTE — Progress Notes (Signed)
[facility] Consultation      Assessment and Plan:  Dyspnea on exertion, multifactorial -Multifactorial dyspnea from volume overload, restrictive lung disease from obesity, deconditioning. - We discussed increasing his exercise activity, he is considering buying a stationary bike to do, starting at a slow pace.  I am going to check a 6-minute walk test to see if he requires oxygen with activity/exertion.  Volume overload.  -3+ bilateral lower extremity edema, patient is following up with nephrology, he continues on hemodialysis, remains on diuresis as he continues to make some urine.  Chronic hypoxic respiratory failure.  -Continue oxygen 2 L at night, will check 6-minute walk test to see if he requires daytime oxygen.  Restrictive lung disease.  - No evidence of COPD/emphysema on imaging, will send for pulmonary function test.  Obstructive sleep apnea.  -History of obstructive sleep apnea, was intolerant to CPAP, will re-study. - If positive for sleep apnea we will continue to work with him to see if he can be can use CPAP effectively.  Orders Placed This Encounter  Procedures  . Pulmonary function test  . Split night study   Return in about 3 months (around 01/22/2018).  Date: 10/24/2017  MRN# [id number] TRENTIN KNAPPENBERGER July 07, 1961   CAROLYN MANISCALCO is a 57 y.o. old male seen in consultation for chief complaint of:    Chief Complaint  Patient presents with  . Advice Only    Hospital referred for Sleep Consult.  . Shortness of Breath    With exertion    HPI:   Patient is a 57 year old male with dyspnea and daytime sleepiness.  He usually goes to bed around 9:30 PM, falls asleep within 10 minutes.  He wakes up and gets out of bed between 8 and 9 AM.  He has lost approximately 70 pounds over the last 2 years.  He was noting that that he was having daytime sleepiness, this improved since he was started on oxygen at 2 L at night. He notes that her has  dyspnea on exertion, he has a history of ESRD on HD. He has dyspnea on exertion, if he walks slowly he will do ok. He does no yard work, he vacuums and notes will make him winded.  He has a history of OSA, diagnosed more than 5 years ago, but felt that he could not catch his breath. He tried it for a few weeks and then stopped.  He smoked up to 3 ppd until 25 yrs ago.   Desat walk today 10/24/17 at RA at rest; sat was 96% and 74. Walked 180 feet with moderate dyspnea, sat was 86% and HR 86  Imaging personally reviewed; CXR 09/18/17; normal lungs.  **Echo 03/03/17; EF 55%   PMHX:   Past Medical History:  Diagnosis Date  . Anemia   . Cellulitis and abscess of right lower extremity 03/01/2017  . Chronic kidney disease    dialysis m,w,f  . Diabetes mellitus without complication ([place])   . Edema extremities 03/01/2017   bilateral swelling  . GERD (gastroesophageal reflux disease)   . High cholesterol   . High triglycerides   . Hypertension   . Neuropathy   . Sleep apnea    can't use cpap d/t feelings of suffocation   Surgical Hx:  Past Surgical History:  Procedure Laterality Date  . A/V FISTULAGRAM Right 08/27/2017   Procedure: A/V FISTULAGRAM;  Surgeon: Katha Cabal, MD;  Location: [facility];  Service: Cardiovascular;  Laterality:  Right;  Marland Kitchen APPENDECTOMY  2010  . AV FISTULA PLACEMENT Right 06/14/2017   Procedure: ARTERIOVENOUS (AV) FISTULA CREATION WRIST;  Surgeon: Katha Cabal, MD;  Location: [facility];  Service: Vascular;  Laterality: Right;  . DIALYSIS/PERMA CATHETER INSERTION N/A 03/05/2017   Procedure: Dialysis/Perma Catheter Insertion;  Surgeon: Katha Cabal, MD;  Location: [place] [facility];  Service: Cardiovascular;  Laterality: N/A;  . DIALYSIS/PERMA CATHETER INSERTION N/A 09/20/2017   Procedure: DIALYSIS/PERMA CATHETER INSERTION;  Surgeon: Katha Cabal, MD;  Location: [place] [facility];  Service: Cardiovascular;  Laterality: N/A;  .  EXCHANGE OF A DIALYSIS CATHETER  03/29/2017   Procedure: Exchange Of A Dialysis Catheter;  Surgeon: Katha Cabal, MD;  Location: [place];  Service: Cardiovascular;;  . IRRIGATION AND DEBRIDEMENT FOOT Right 03/01/2017   Procedure: IRRIGATION AND DEBRIDEMENT FOOT;  Surgeon: Albertine Patricia, DPM;  Location: [facility];  Service: Podiatry;  Laterality: Right;  application of wound vac   Family Hx:  Family History  Problem Relation Age of Onset  . CAD Father    Social Hx:   Social History   Tobacco Use  . Smoking status: Never Smoker  . Smokeless tobacco: Current User    Types: Snuff  Substance Use Topics  . 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 Drug use: No   Medication:    Current Outpatient Medications:  .  acarbose (PRECOSE) 100 MG tablet, Take 100 mg 3 (three) times daily with meals by mouth., Disp: , Rfl:  .  aspirin EC 81 MG tablet, TAKE ONE  BY MOUTH EVERY DAY, Disp: 30 tablet, Rfl: 10 .  furosemide (LASIX) 80 MG tablet, Take 1 tablet (80 mg total) by mouth daily. (Patient taking differently: Take 160 mg by mouth 2 (two) times daily. ), Disp: 30 tablet, Rfl: 0 .  gabapentin (NEURONTIN) 300 MG capsule, Take 1 capsule (300 mg total) by mouth 2 (two) times daily., Disp: 60 capsule, Rfl: 0 .  glucose blood (ACCU-CHEK AVIVA) test strip, Use as instructed.use to check blood sugar up to 3 times a day  Disp: 1 box, Disp: 100 each, Rfl: 11 .  insulin aspart (NOVOLOG) 100 UNIT/ML injection, Inject 0-5 Units into the skin at bedtime., Disp: 10 mL, Rfl: 11 .  insulin aspart (NOVOLOG) 100 UNIT/ML injection, Inject 6 Units into the skin 3 (three) times daily with meals., Disp: 10 mL, Rfl: 11 .  insulin aspart (NOVOLOG) 100 UNIT/ML injection, Inject 0-15 Units into the skin 3 (three) times daily with meals., Disp: 10 mL, Rfl: 11 .  insulin detemir (LEVEMIR) 100 UNIT/ML injection, Inject 0.25 mLs (25 Units total) into the skin 2 (two) times  daily., Disp: 10 mL, Rfl: 11 .  Insulin Pen Needle 31G X 8 MM MISC, 1 Container by Does not apply route QID. qid--any brand, Disp: 100 each, Rfl: 11 .  INSULIN SYRINGE 1CC/29G 29G X 1/2" 1 ML MISC, Use as directed with insulin, Disp: , Rfl:  .  Insulin Syringes, Disposable, U-100 1 ML MISC, Use as directed with insulin, Disp: 100 each, Rfl: 3 .  Lancets (ACCU-CHEK MULTICLIX) lancets, Use as instructed.  check blood sugar up to 3 times a day Disp:  1 box, Disp: 100 each, Rfl: 11 .  losartan (COZAAR) 25 MG tablet, Take 25 mg daily by mouth., Disp: , Rfl:  .  metoprolol tartrate (LOPRESSOR) 25 MG tablet, Take 1 tablet (25 mg  total) by mouth 2 (two) times daily., Disp: 60 tablet, Rfl: 0 .  Multiple Vitamin (MULTIVITAMIN WITH MINERALS) TABS tablet, Take 1 tablet by mouth daily., Disp: , Rfl:  .  omeprazole (PRILOSEC OTC) 20 MG tablet, Take 20 mg every other day by mouth., Disp: , Rfl:  .  oxymetazoline (AFRIN) 0.05 % nasal spray, Place 1 spray into both nostrils daily as needed for congestion., Disp: , Rfl:  .  PROAIR HFA 108 (90 Base) MCG/ACT inhaler, Inhale 2 puffs into the lungs every 4 (four) hours as needed., Disp: , Rfl: 0 .  SYMBICORT 80-4.5 MCG/ACT inhaler, Inhale 1 puff into the lungs 2 (two) times daily. , Disp: , Rfl: 0   Allergies:  Codeine  Review of Systems: Gen:  Denies  fever, sweats, chills HEENT: Denies blurred vision, double vision. bleeds, sore throat Cvc:  No dizziness, chest pain. Resp:   Denies cough or sputum production, shortness of breath Gi: Denies swallowing difficulty, stomach pain. Gu:  Denies bladder incontinence, burning urine Ext:   No Joint pain, stiffness. Skin: No skin rash,  hives  Endoc:  No polyuria, polydipsia. Psych: No depression, insomnia. Other:  All other systems were reviewed with the patient and were negative other that what is mentioned in the HPI.   Physical Examination:   VS: BP 136/76 (BP Location: Left Arm, Cuff Size: Large)   Pulse 74    Resp 16   Ht 5' 10.5" (1.791 m)   Wt 299 lb (135.6 kg)   SpO2 95%   BMI 42.30 kg/m   General Appearance: No distress  Neuro:without focal findings,  speech normal,  HEENT: PERRLA, EOM intact.   Pulmonary: normal breath sounds, No wheezing.  CardiovascularNormal S1,S2.  No m/r/g.   Abdomen: Benign, Soft, non-tender. Renal:  No costovertebral tenderness  GU:  No performed at this time. Endoc: No evident thyromegaly, no signs of acromegaly. Skin:   warm, no rashes, no ecchymosis  Extremities: normal, no cyanosis, clubbing.  Other findings:    LABORATORY PANEL:   CBC No results for input(s): WBC, HGB, HCT, PLT in the last 168 hours. ------------------------------------------------------------------------------------------------------------------  Chemistries  No results for input(s): NA, K, CL, CO2, GLUCOSE, BUN, CREATININE, CALCIUM, MG, AST, ALT, ALKPHOS, BILITOT in the last 168 hours.  Invalid input(s): GFRCGP ------------------------------------------------------------------------------------------------------------------  Cardiac Enzymes No results for input(s): TROPONINI in the last 168 hours. ------------------------------------------------------------  RADIOLOGY:  No results found.     Thank  you for the consultation and for allowing [facility] to assist in the care of your patient. Our recommendations are noted above.  Please contact us if we can be of further service.   Marda Stalker, MD.  Board Certified in Internal Medicine, Pulmonary Medicine, [place], and Sleep Medicine.  [facility] Office Number: [phone]  Patricia Pesa, M.D.  Merton Border, M.D  10/24/2017

## 2017-10-24 NOTE — Patient Instructions (Addendum)
Will send for sleep study, and pulmonary function test.   Will check 6 minute walk test to see if you require oxygen during the day with exercise.

## 2017-10-25 DIAGNOSIS — Z992 Dependence on renal dialysis: Secondary | ICD-10-CM | POA: Diagnosis not present

## 2017-10-25 DIAGNOSIS — N186 End stage renal disease: Secondary | ICD-10-CM | POA: Diagnosis not present

## 2017-10-25 DIAGNOSIS — N2581 Secondary hyperparathyroidism of renal origin: Secondary | ICD-10-CM | POA: Diagnosis not present

## 2017-10-25 DIAGNOSIS — D509 Iron deficiency anemia, unspecified: Secondary | ICD-10-CM | POA: Diagnosis not present

## 2017-10-28 DIAGNOSIS — D509 Iron deficiency anemia, unspecified: Secondary | ICD-10-CM | POA: Diagnosis not present

## 2017-10-28 DIAGNOSIS — Z992 Dependence on renal dialysis: Secondary | ICD-10-CM | POA: Diagnosis not present

## 2017-10-28 DIAGNOSIS — N2581 Secondary hyperparathyroidism of renal origin: Secondary | ICD-10-CM | POA: Diagnosis not present

## 2017-10-28 DIAGNOSIS — N186 End stage renal disease: Secondary | ICD-10-CM | POA: Diagnosis not present

## 2017-10-31 DIAGNOSIS — Z452 Encounter for adjustment and management of vascular access device: Secondary | ICD-10-CM | POA: Diagnosis not present

## 2017-11-01 DIAGNOSIS — N186 End stage renal disease: Secondary | ICD-10-CM | POA: Diagnosis not present

## 2017-11-01 DIAGNOSIS — Z992 Dependence on renal dialysis: Secondary | ICD-10-CM | POA: Diagnosis not present

## 2017-11-01 DIAGNOSIS — N2581 Secondary hyperparathyroidism of renal origin: Secondary | ICD-10-CM | POA: Diagnosis not present

## 2017-11-01 DIAGNOSIS — D509 Iron deficiency anemia, unspecified: Secondary | ICD-10-CM | POA: Diagnosis not present

## 2017-11-03 IMAGING — DX DG CHEST 1V
1 series · 1 of 1 positions shown · non-contrast
Comparison: 02/27/2017

CLINICAL DATA: Replacement of central line.

EXAM:
CHEST 1 VIEW

[chest ap]
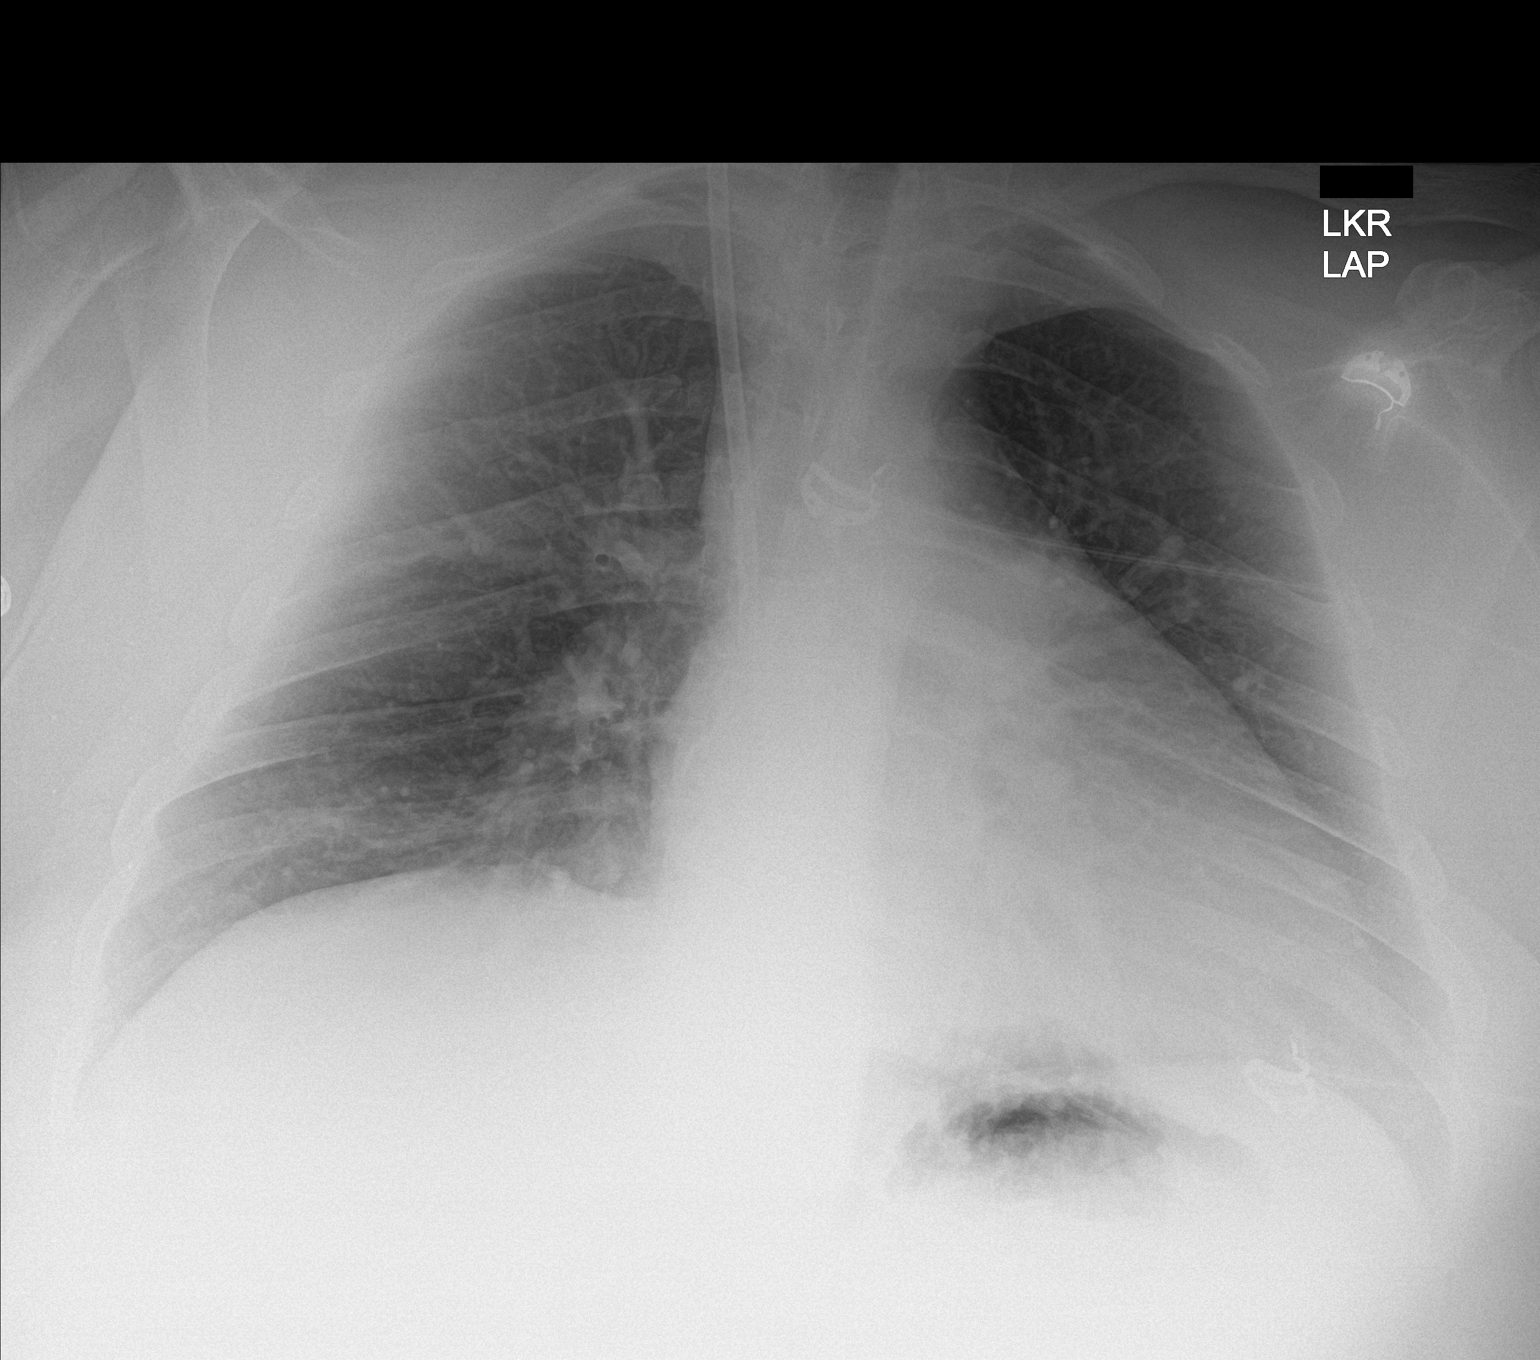

[1 of 1 positions shown; findings below may reference images not displayed]

FINDINGS: Double lumen central catheter in place in good position. No
pneumothorax. Lungs are clear. Heart size and vascularity are
normal.
IMPRESSION: Central line in good position in the superior vena cava.

## 2017-11-04 DIAGNOSIS — N186 End stage renal disease: Secondary | ICD-10-CM | POA: Diagnosis not present

## 2017-11-04 DIAGNOSIS — N2581 Secondary hyperparathyroidism of renal origin: Secondary | ICD-10-CM | POA: Diagnosis not present

## 2017-11-04 DIAGNOSIS — D509 Iron deficiency anemia, unspecified: Secondary | ICD-10-CM | POA: Diagnosis not present

## 2017-11-04 DIAGNOSIS — Z992 Dependence on renal dialysis: Secondary | ICD-10-CM | POA: Diagnosis not present

## 2017-11-05 ENCOUNTER — Inpatient Hospital Stay (HOSPITAL_COMMUNITY): Admission: RE | Admit: 2017-11-05 | Payer: Medicare Other | Source: Ambulatory Visit

## 2017-11-06 DIAGNOSIS — Z992 Dependence on renal dialysis: Secondary | ICD-10-CM | POA: Diagnosis not present

## 2017-11-06 DIAGNOSIS — D509 Iron deficiency anemia, unspecified: Secondary | ICD-10-CM | POA: Diagnosis not present

## 2017-11-06 DIAGNOSIS — N2581 Secondary hyperparathyroidism of renal origin: Secondary | ICD-10-CM | POA: Diagnosis not present

## 2017-11-06 DIAGNOSIS — N186 End stage renal disease: Secondary | ICD-10-CM | POA: Diagnosis not present

## 2017-11-07 DIAGNOSIS — Z992 Dependence on renal dialysis: Secondary | ICD-10-CM | POA: Diagnosis not present

## 2017-11-07 DIAGNOSIS — N186 End stage renal disease: Secondary | ICD-10-CM | POA: Diagnosis not present

## 2017-11-08 DIAGNOSIS — D509 Iron deficiency anemia, unspecified: Secondary | ICD-10-CM | POA: Diagnosis not present

## 2017-11-08 DIAGNOSIS — N186 End stage renal disease: Secondary | ICD-10-CM | POA: Diagnosis not present

## 2017-11-08 DIAGNOSIS — Z992 Dependence on renal dialysis: Secondary | ICD-10-CM | POA: Diagnosis not present

## 2017-11-11 DIAGNOSIS — D509 Iron deficiency anemia, unspecified: Secondary | ICD-10-CM | POA: Diagnosis not present

## 2017-11-11 DIAGNOSIS — Z992 Dependence on renal dialysis: Secondary | ICD-10-CM | POA: Diagnosis not present

## 2017-11-11 DIAGNOSIS — N186 End stage renal disease: Secondary | ICD-10-CM | POA: Diagnosis not present

## 2017-11-15 DIAGNOSIS — Z992 Dependence on renal dialysis: Secondary | ICD-10-CM | POA: Diagnosis not present

## 2017-11-15 DIAGNOSIS — D509 Iron deficiency anemia, unspecified: Secondary | ICD-10-CM | POA: Diagnosis not present

## 2017-11-15 DIAGNOSIS — N186 End stage renal disease: Secondary | ICD-10-CM | POA: Diagnosis not present

## 2017-11-18 DIAGNOSIS — I10 Essential (primary) hypertension: Secondary | ICD-10-CM | POA: Diagnosis not present

## 2017-11-18 DIAGNOSIS — J029 Acute pharyngitis, unspecified: Secondary | ICD-10-CM | POA: Diagnosis not present

## 2017-11-18 DIAGNOSIS — E662 Morbid (severe) obesity with alveolar hypoventilation: Secondary | ICD-10-CM | POA: Diagnosis not present

## 2017-11-18 DIAGNOSIS — E1129 Type 2 diabetes mellitus with other diabetic kidney complication: Secondary | ICD-10-CM | POA: Diagnosis not present

## 2017-11-20 DIAGNOSIS — Z992 Dependence on renal dialysis: Secondary | ICD-10-CM | POA: Diagnosis not present

## 2017-11-20 DIAGNOSIS — N186 End stage renal disease: Secondary | ICD-10-CM | POA: Diagnosis not present

## 2017-11-20 DIAGNOSIS — D509 Iron deficiency anemia, unspecified: Secondary | ICD-10-CM | POA: Diagnosis not present

## 2017-11-22 DIAGNOSIS — N186 End stage renal disease: Secondary | ICD-10-CM | POA: Diagnosis not present

## 2017-11-22 DIAGNOSIS — Z992 Dependence on renal dialysis: Secondary | ICD-10-CM | POA: Diagnosis not present

## 2017-11-22 DIAGNOSIS — D509 Iron deficiency anemia, unspecified: Secondary | ICD-10-CM | POA: Diagnosis not present

## 2017-11-22 DIAGNOSIS — Z1159 Encounter for screening for other viral diseases: Secondary | ICD-10-CM | POA: Diagnosis not present

## 2017-11-25 DIAGNOSIS — D509 Iron deficiency anemia, unspecified: Secondary | ICD-10-CM | POA: Diagnosis not present

## 2017-11-25 DIAGNOSIS — Z992 Dependence on renal dialysis: Secondary | ICD-10-CM | POA: Diagnosis not present

## 2017-11-25 DIAGNOSIS — N186 End stage renal disease: Secondary | ICD-10-CM | POA: Diagnosis not present

## 2017-11-27 DIAGNOSIS — Z992 Dependence on renal dialysis: Secondary | ICD-10-CM | POA: Diagnosis not present

## 2017-11-27 DIAGNOSIS — N186 End stage renal disease: Secondary | ICD-10-CM | POA: Diagnosis not present

## 2017-11-27 DIAGNOSIS — D509 Iron deficiency anemia, unspecified: Secondary | ICD-10-CM | POA: Diagnosis not present

## 2017-11-29 DIAGNOSIS — Z992 Dependence on renal dialysis: Secondary | ICD-10-CM | POA: Diagnosis not present

## 2017-11-29 DIAGNOSIS — D509 Iron deficiency anemia, unspecified: Secondary | ICD-10-CM | POA: Diagnosis not present

## 2017-11-29 DIAGNOSIS — N186 End stage renal disease: Secondary | ICD-10-CM | POA: Diagnosis not present

## 2017-12-02 DIAGNOSIS — N186 End stage renal disease: Secondary | ICD-10-CM | POA: Diagnosis not present

## 2017-12-02 DIAGNOSIS — Z992 Dependence on renal dialysis: Secondary | ICD-10-CM | POA: Diagnosis not present

## 2017-12-02 DIAGNOSIS — D509 Iron deficiency anemia, unspecified: Secondary | ICD-10-CM | POA: Diagnosis not present

## 2017-12-04 DIAGNOSIS — N186 End stage renal disease: Secondary | ICD-10-CM | POA: Diagnosis not present

## 2017-12-04 DIAGNOSIS — D509 Iron deficiency anemia, unspecified: Secondary | ICD-10-CM | POA: Diagnosis not present

## 2017-12-04 DIAGNOSIS — Z992 Dependence on renal dialysis: Secondary | ICD-10-CM | POA: Diagnosis not present

## 2017-12-05 DIAGNOSIS — Z992 Dependence on renal dialysis: Secondary | ICD-10-CM | POA: Diagnosis not present

## 2017-12-05 DIAGNOSIS — N186 End stage renal disease: Secondary | ICD-10-CM | POA: Diagnosis not present

## 2017-12-07 DIAGNOSIS — E8779 Other fluid overload: Secondary | ICD-10-CM | POA: Diagnosis not present

## 2017-12-07 DIAGNOSIS — N186 End stage renal disease: Secondary | ICD-10-CM | POA: Diagnosis not present

## 2017-12-07 DIAGNOSIS — D509 Iron deficiency anemia, unspecified: Secondary | ICD-10-CM | POA: Diagnosis not present

## 2017-12-07 DIAGNOSIS — Z992 Dependence on renal dialysis: Secondary | ICD-10-CM | POA: Diagnosis not present

## 2017-12-09 DIAGNOSIS — N186 End stage renal disease: Secondary | ICD-10-CM | POA: Diagnosis not present

## 2017-12-09 DIAGNOSIS — Z992 Dependence on renal dialysis: Secondary | ICD-10-CM | POA: Diagnosis not present

## 2017-12-09 DIAGNOSIS — E8779 Other fluid overload: Secondary | ICD-10-CM | POA: Diagnosis not present

## 2017-12-09 DIAGNOSIS — D509 Iron deficiency anemia, unspecified: Secondary | ICD-10-CM | POA: Diagnosis not present

## 2017-12-11 DIAGNOSIS — E8779 Other fluid overload: Secondary | ICD-10-CM | POA: Diagnosis not present

## 2017-12-11 DIAGNOSIS — Z992 Dependence on renal dialysis: Secondary | ICD-10-CM | POA: Diagnosis not present

## 2017-12-11 DIAGNOSIS — N186 End stage renal disease: Secondary | ICD-10-CM | POA: Diagnosis not present

## 2017-12-11 DIAGNOSIS — D509 Iron deficiency anemia, unspecified: Secondary | ICD-10-CM | POA: Diagnosis not present

## 2017-12-13 DIAGNOSIS — N186 End stage renal disease: Secondary | ICD-10-CM | POA: Diagnosis not present

## 2017-12-13 DIAGNOSIS — Z992 Dependence on renal dialysis: Secondary | ICD-10-CM | POA: Diagnosis not present

## 2017-12-13 DIAGNOSIS — D509 Iron deficiency anemia, unspecified: Secondary | ICD-10-CM | POA: Diagnosis not present

## 2017-12-13 DIAGNOSIS — E8779 Other fluid overload: Secondary | ICD-10-CM | POA: Diagnosis not present

## 2017-12-16 DIAGNOSIS — E8779 Other fluid overload: Secondary | ICD-10-CM | POA: Diagnosis not present

## 2017-12-16 DIAGNOSIS — N186 End stage renal disease: Secondary | ICD-10-CM | POA: Diagnosis not present

## 2017-12-16 DIAGNOSIS — Z992 Dependence on renal dialysis: Secondary | ICD-10-CM | POA: Diagnosis not present

## 2017-12-16 DIAGNOSIS — D509 Iron deficiency anemia, unspecified: Secondary | ICD-10-CM | POA: Diagnosis not present

## 2017-12-17 DIAGNOSIS — D509 Iron deficiency anemia, unspecified: Secondary | ICD-10-CM | POA: Diagnosis not present

## 2017-12-17 DIAGNOSIS — E8779 Other fluid overload: Secondary | ICD-10-CM | POA: Diagnosis not present

## 2017-12-17 DIAGNOSIS — N186 End stage renal disease: Secondary | ICD-10-CM | POA: Diagnosis not present

## 2017-12-17 DIAGNOSIS — Z992 Dependence on renal dialysis: Secondary | ICD-10-CM | POA: Diagnosis not present

## 2017-12-18 DIAGNOSIS — D509 Iron deficiency anemia, unspecified: Secondary | ICD-10-CM | POA: Diagnosis not present

## 2017-12-18 DIAGNOSIS — E8779 Other fluid overload: Secondary | ICD-10-CM | POA: Diagnosis not present

## 2017-12-18 DIAGNOSIS — N186 End stage renal disease: Secondary | ICD-10-CM | POA: Diagnosis not present

## 2017-12-18 DIAGNOSIS — Z992 Dependence on renal dialysis: Secondary | ICD-10-CM | POA: Diagnosis not present

## 2017-12-20 DIAGNOSIS — Z992 Dependence on renal dialysis: Secondary | ICD-10-CM | POA: Diagnosis not present

## 2017-12-20 DIAGNOSIS — N186 End stage renal disease: Secondary | ICD-10-CM | POA: Diagnosis not present

## 2017-12-20 DIAGNOSIS — D509 Iron deficiency anemia, unspecified: Secondary | ICD-10-CM | POA: Diagnosis not present

## 2017-12-20 DIAGNOSIS — E8779 Other fluid overload: Secondary | ICD-10-CM | POA: Diagnosis not present

## 2017-12-23 DIAGNOSIS — E8779 Other fluid overload: Secondary | ICD-10-CM | POA: Diagnosis not present

## 2017-12-23 DIAGNOSIS — N186 End stage renal disease: Secondary | ICD-10-CM | POA: Diagnosis not present

## 2017-12-23 DIAGNOSIS — D509 Iron deficiency anemia, unspecified: Secondary | ICD-10-CM | POA: Diagnosis not present

## 2017-12-23 DIAGNOSIS — Z992 Dependence on renal dialysis: Secondary | ICD-10-CM | POA: Diagnosis not present

## 2017-12-25 DIAGNOSIS — E8779 Other fluid overload: Secondary | ICD-10-CM | POA: Diagnosis not present

## 2017-12-25 DIAGNOSIS — Z992 Dependence on renal dialysis: Secondary | ICD-10-CM | POA: Diagnosis not present

## 2017-12-25 DIAGNOSIS — D509 Iron deficiency anemia, unspecified: Secondary | ICD-10-CM | POA: Diagnosis not present

## 2017-12-25 DIAGNOSIS — N186 End stage renal disease: Secondary | ICD-10-CM | POA: Diagnosis not present

## 2017-12-27 DIAGNOSIS — E8779 Other fluid overload: Secondary | ICD-10-CM | POA: Diagnosis not present

## 2017-12-27 DIAGNOSIS — N186 End stage renal disease: Secondary | ICD-10-CM | POA: Diagnosis not present

## 2017-12-27 DIAGNOSIS — D509 Iron deficiency anemia, unspecified: Secondary | ICD-10-CM | POA: Diagnosis not present

## 2017-12-27 DIAGNOSIS — Z992 Dependence on renal dialysis: Secondary | ICD-10-CM | POA: Diagnosis not present

## 2018-01-01 DIAGNOSIS — Z992 Dependence on renal dialysis: Secondary | ICD-10-CM | POA: Diagnosis not present

## 2018-01-01 DIAGNOSIS — E8779 Other fluid overload: Secondary | ICD-10-CM | POA: Diagnosis not present

## 2018-01-01 DIAGNOSIS — N186 End stage renal disease: Secondary | ICD-10-CM | POA: Diagnosis not present

## 2018-01-01 DIAGNOSIS — D509 Iron deficiency anemia, unspecified: Secondary | ICD-10-CM | POA: Diagnosis not present

## 2018-01-03 DIAGNOSIS — Z992 Dependence on renal dialysis: Secondary | ICD-10-CM | POA: Diagnosis not present

## 2018-01-03 DIAGNOSIS — E8779 Other fluid overload: Secondary | ICD-10-CM | POA: Diagnosis not present

## 2018-01-03 DIAGNOSIS — N186 End stage renal disease: Secondary | ICD-10-CM | POA: Diagnosis not present

## 2018-01-03 DIAGNOSIS — D509 Iron deficiency anemia, unspecified: Secondary | ICD-10-CM | POA: Diagnosis not present

## 2018-01-05 DIAGNOSIS — Z992 Dependence on renal dialysis: Secondary | ICD-10-CM | POA: Diagnosis not present

## 2018-01-05 DIAGNOSIS — N186 End stage renal disease: Secondary | ICD-10-CM | POA: Diagnosis not present

## 2018-01-08 DIAGNOSIS — N2581 Secondary hyperparathyroidism of renal origin: Secondary | ICD-10-CM | POA: Diagnosis not present

## 2018-01-08 DIAGNOSIS — N186 End stage renal disease: Secondary | ICD-10-CM | POA: Diagnosis not present

## 2018-01-08 DIAGNOSIS — D509 Iron deficiency anemia, unspecified: Secondary | ICD-10-CM | POA: Diagnosis not present

## 2018-01-08 DIAGNOSIS — Z992 Dependence on renal dialysis: Secondary | ICD-10-CM | POA: Diagnosis not present

## 2018-01-10 DIAGNOSIS — N186 End stage renal disease: Secondary | ICD-10-CM | POA: Diagnosis not present

## 2018-01-10 DIAGNOSIS — D509 Iron deficiency anemia, unspecified: Secondary | ICD-10-CM | POA: Diagnosis not present

## 2018-01-10 DIAGNOSIS — N2581 Secondary hyperparathyroidism of renal origin: Secondary | ICD-10-CM | POA: Diagnosis not present

## 2018-01-10 DIAGNOSIS — Z992 Dependence on renal dialysis: Secondary | ICD-10-CM | POA: Diagnosis not present

## 2018-01-13 DIAGNOSIS — Z992 Dependence on renal dialysis: Secondary | ICD-10-CM | POA: Diagnosis not present

## 2018-01-13 DIAGNOSIS — D509 Iron deficiency anemia, unspecified: Secondary | ICD-10-CM | POA: Diagnosis not present

## 2018-01-13 DIAGNOSIS — E119 Type 2 diabetes mellitus without complications: Secondary | ICD-10-CM | POA: Diagnosis not present

## 2018-01-13 DIAGNOSIS — Z794 Long term (current) use of insulin: Secondary | ICD-10-CM | POA: Diagnosis not present

## 2018-01-13 DIAGNOSIS — N186 End stage renal disease: Secondary | ICD-10-CM | POA: Diagnosis not present

## 2018-01-13 DIAGNOSIS — N2581 Secondary hyperparathyroidism of renal origin: Secondary | ICD-10-CM | POA: Diagnosis not present

## 2018-01-15 DIAGNOSIS — N2581 Secondary hyperparathyroidism of renal origin: Secondary | ICD-10-CM | POA: Diagnosis not present

## 2018-01-15 DIAGNOSIS — Z992 Dependence on renal dialysis: Secondary | ICD-10-CM | POA: Diagnosis not present

## 2018-01-15 DIAGNOSIS — N186 End stage renal disease: Secondary | ICD-10-CM | POA: Diagnosis not present

## 2018-01-15 DIAGNOSIS — D509 Iron deficiency anemia, unspecified: Secondary | ICD-10-CM | POA: Diagnosis not present

## 2018-01-17 DIAGNOSIS — N186 End stage renal disease: Secondary | ICD-10-CM | POA: Diagnosis not present

## 2018-01-17 DIAGNOSIS — N2581 Secondary hyperparathyroidism of renal origin: Secondary | ICD-10-CM | POA: Diagnosis not present

## 2018-01-17 DIAGNOSIS — D509 Iron deficiency anemia, unspecified: Secondary | ICD-10-CM | POA: Diagnosis not present

## 2018-01-17 DIAGNOSIS — Z992 Dependence on renal dialysis: Secondary | ICD-10-CM | POA: Diagnosis not present

## 2018-01-20 DIAGNOSIS — N2581 Secondary hyperparathyroidism of renal origin: Secondary | ICD-10-CM | POA: Diagnosis not present

## 2018-01-20 DIAGNOSIS — D509 Iron deficiency anemia, unspecified: Secondary | ICD-10-CM | POA: Diagnosis not present

## 2018-01-20 DIAGNOSIS — N186 End stage renal disease: Secondary | ICD-10-CM | POA: Diagnosis not present

## 2018-01-20 DIAGNOSIS — Z992 Dependence on renal dialysis: Secondary | ICD-10-CM | POA: Diagnosis not present

## 2018-01-22 DIAGNOSIS — Z992 Dependence on renal dialysis: Secondary | ICD-10-CM | POA: Diagnosis not present

## 2018-01-22 DIAGNOSIS — N186 End stage renal disease: Secondary | ICD-10-CM | POA: Diagnosis not present

## 2018-01-22 DIAGNOSIS — D509 Iron deficiency anemia, unspecified: Secondary | ICD-10-CM | POA: Diagnosis not present

## 2018-01-22 DIAGNOSIS — N2581 Secondary hyperparathyroidism of renal origin: Secondary | ICD-10-CM | POA: Diagnosis not present

## 2018-01-24 DIAGNOSIS — D509 Iron deficiency anemia, unspecified: Secondary | ICD-10-CM | POA: Diagnosis not present

## 2018-01-24 DIAGNOSIS — N186 End stage renal disease: Secondary | ICD-10-CM | POA: Diagnosis not present

## 2018-01-24 DIAGNOSIS — Z992 Dependence on renal dialysis: Secondary | ICD-10-CM | POA: Diagnosis not present

## 2018-01-24 DIAGNOSIS — N2581 Secondary hyperparathyroidism of renal origin: Secondary | ICD-10-CM | POA: Diagnosis not present

## 2018-01-27 DIAGNOSIS — Z992 Dependence on renal dialysis: Secondary | ICD-10-CM | POA: Diagnosis not present

## 2018-01-27 DIAGNOSIS — D509 Iron deficiency anemia, unspecified: Secondary | ICD-10-CM | POA: Diagnosis not present

## 2018-01-27 DIAGNOSIS — N2581 Secondary hyperparathyroidism of renal origin: Secondary | ICD-10-CM | POA: Diagnosis not present

## 2018-01-27 DIAGNOSIS — N186 End stage renal disease: Secondary | ICD-10-CM | POA: Diagnosis not present

## 2018-01-29 DIAGNOSIS — Z992 Dependence on renal dialysis: Secondary | ICD-10-CM | POA: Diagnosis not present

## 2018-01-29 DIAGNOSIS — D509 Iron deficiency anemia, unspecified: Secondary | ICD-10-CM | POA: Diagnosis not present

## 2018-01-29 DIAGNOSIS — N186 End stage renal disease: Secondary | ICD-10-CM | POA: Diagnosis not present

## 2018-01-29 DIAGNOSIS — N2581 Secondary hyperparathyroidism of renal origin: Secondary | ICD-10-CM | POA: Diagnosis not present

## 2018-01-31 DIAGNOSIS — D509 Iron deficiency anemia, unspecified: Secondary | ICD-10-CM | POA: Diagnosis not present

## 2018-01-31 DIAGNOSIS — N2581 Secondary hyperparathyroidism of renal origin: Secondary | ICD-10-CM | POA: Diagnosis not present

## 2018-01-31 DIAGNOSIS — N186 End stage renal disease: Secondary | ICD-10-CM | POA: Diagnosis not present

## 2018-01-31 DIAGNOSIS — Z992 Dependence on renal dialysis: Secondary | ICD-10-CM | POA: Diagnosis not present

## 2018-02-03 DIAGNOSIS — Z992 Dependence on renal dialysis: Secondary | ICD-10-CM | POA: Diagnosis not present

## 2018-02-03 DIAGNOSIS — N186 End stage renal disease: Secondary | ICD-10-CM | POA: Diagnosis not present

## 2018-02-03 DIAGNOSIS — N2581 Secondary hyperparathyroidism of renal origin: Secondary | ICD-10-CM | POA: Diagnosis not present

## 2018-02-03 DIAGNOSIS — D509 Iron deficiency anemia, unspecified: Secondary | ICD-10-CM | POA: Diagnosis not present

## 2018-02-04 DIAGNOSIS — Z992 Dependence on renal dialysis: Secondary | ICD-10-CM | POA: Diagnosis not present

## 2018-02-04 DIAGNOSIS — N186 End stage renal disease: Secondary | ICD-10-CM | POA: Diagnosis not present

## 2018-02-05 DIAGNOSIS — N186 End stage renal disease: Secondary | ICD-10-CM | POA: Diagnosis not present

## 2018-02-05 DIAGNOSIS — R1110 Vomiting, unspecified: Secondary | ICD-10-CM | POA: Diagnosis not present

## 2018-02-05 DIAGNOSIS — R110 Nausea: Secondary | ICD-10-CM | POA: Diagnosis not present

## 2018-02-05 DIAGNOSIS — D509 Iron deficiency anemia, unspecified: Secondary | ICD-10-CM | POA: Diagnosis not present

## 2018-02-05 DIAGNOSIS — N2581 Secondary hyperparathyroidism of renal origin: Secondary | ICD-10-CM | POA: Diagnosis not present

## 2018-02-05 DIAGNOSIS — Z992 Dependence on renal dialysis: Secondary | ICD-10-CM | POA: Diagnosis not present

## 2018-02-06 DIAGNOSIS — D509 Iron deficiency anemia, unspecified: Secondary | ICD-10-CM | POA: Diagnosis not present

## 2018-02-06 DIAGNOSIS — N2581 Secondary hyperparathyroidism of renal origin: Secondary | ICD-10-CM | POA: Diagnosis not present

## 2018-02-06 DIAGNOSIS — N186 End stage renal disease: Secondary | ICD-10-CM | POA: Diagnosis not present

## 2018-02-06 DIAGNOSIS — R110 Nausea: Secondary | ICD-10-CM | POA: Diagnosis not present

## 2018-02-06 DIAGNOSIS — R1110 Vomiting, unspecified: Secondary | ICD-10-CM | POA: Diagnosis not present

## 2018-02-06 DIAGNOSIS — Z992 Dependence on renal dialysis: Secondary | ICD-10-CM | POA: Diagnosis not present

## 2018-02-10 DIAGNOSIS — N186 End stage renal disease: Secondary | ICD-10-CM | POA: Diagnosis not present

## 2018-02-10 DIAGNOSIS — D509 Iron deficiency anemia, unspecified: Secondary | ICD-10-CM | POA: Diagnosis not present

## 2018-02-10 DIAGNOSIS — Z992 Dependence on renal dialysis: Secondary | ICD-10-CM | POA: Diagnosis not present

## 2018-02-10 DIAGNOSIS — R110 Nausea: Secondary | ICD-10-CM | POA: Diagnosis not present

## 2018-02-10 DIAGNOSIS — R1110 Vomiting, unspecified: Secondary | ICD-10-CM | POA: Diagnosis not present

## 2018-02-10 DIAGNOSIS — N2581 Secondary hyperparathyroidism of renal origin: Secondary | ICD-10-CM | POA: Diagnosis not present

## 2018-02-12 DIAGNOSIS — R110 Nausea: Secondary | ICD-10-CM | POA: Diagnosis not present

## 2018-02-12 DIAGNOSIS — N186 End stage renal disease: Secondary | ICD-10-CM | POA: Diagnosis not present

## 2018-02-12 DIAGNOSIS — D509 Iron deficiency anemia, unspecified: Secondary | ICD-10-CM | POA: Diagnosis not present

## 2018-02-12 DIAGNOSIS — N2581 Secondary hyperparathyroidism of renal origin: Secondary | ICD-10-CM | POA: Diagnosis not present

## 2018-02-12 DIAGNOSIS — Z992 Dependence on renal dialysis: Secondary | ICD-10-CM | POA: Diagnosis not present

## 2018-02-12 DIAGNOSIS — R1110 Vomiting, unspecified: Secondary | ICD-10-CM | POA: Diagnosis not present

## 2018-02-14 DIAGNOSIS — D509 Iron deficiency anemia, unspecified: Secondary | ICD-10-CM | POA: Diagnosis not present

## 2018-02-14 DIAGNOSIS — R1110 Vomiting, unspecified: Secondary | ICD-10-CM | POA: Diagnosis not present

## 2018-02-14 DIAGNOSIS — N2581 Secondary hyperparathyroidism of renal origin: Secondary | ICD-10-CM | POA: Diagnosis not present

## 2018-02-14 DIAGNOSIS — N186 End stage renal disease: Secondary | ICD-10-CM | POA: Diagnosis not present

## 2018-02-14 DIAGNOSIS — R110 Nausea: Secondary | ICD-10-CM | POA: Diagnosis not present

## 2018-02-14 DIAGNOSIS — Z992 Dependence on renal dialysis: Secondary | ICD-10-CM | POA: Diagnosis not present

## 2018-02-17 DIAGNOSIS — Z992 Dependence on renal dialysis: Secondary | ICD-10-CM | POA: Diagnosis not present

## 2018-02-17 DIAGNOSIS — R1110 Vomiting, unspecified: Secondary | ICD-10-CM | POA: Diagnosis not present

## 2018-02-17 DIAGNOSIS — N186 End stage renal disease: Secondary | ICD-10-CM | POA: Diagnosis not present

## 2018-02-17 DIAGNOSIS — D509 Iron deficiency anemia, unspecified: Secondary | ICD-10-CM | POA: Diagnosis not present

## 2018-02-17 DIAGNOSIS — N2581 Secondary hyperparathyroidism of renal origin: Secondary | ICD-10-CM | POA: Diagnosis not present

## 2018-02-17 DIAGNOSIS — R110 Nausea: Secondary | ICD-10-CM | POA: Diagnosis not present

## 2018-02-21 DIAGNOSIS — D509 Iron deficiency anemia, unspecified: Secondary | ICD-10-CM | POA: Diagnosis not present

## 2018-02-21 DIAGNOSIS — Z992 Dependence on renal dialysis: Secondary | ICD-10-CM | POA: Diagnosis not present

## 2018-02-21 DIAGNOSIS — N2581 Secondary hyperparathyroidism of renal origin: Secondary | ICD-10-CM | POA: Diagnosis not present

## 2018-02-21 DIAGNOSIS — R1110 Vomiting, unspecified: Secondary | ICD-10-CM | POA: Diagnosis not present

## 2018-02-21 DIAGNOSIS — N186 End stage renal disease: Secondary | ICD-10-CM | POA: Diagnosis not present

## 2018-02-21 DIAGNOSIS — R110 Nausea: Secondary | ICD-10-CM | POA: Diagnosis not present

## 2018-02-24 DIAGNOSIS — N186 End stage renal disease: Secondary | ICD-10-CM | POA: Diagnosis not present

## 2018-02-24 DIAGNOSIS — N2581 Secondary hyperparathyroidism of renal origin: Secondary | ICD-10-CM | POA: Diagnosis not present

## 2018-02-24 DIAGNOSIS — R110 Nausea: Secondary | ICD-10-CM | POA: Diagnosis not present

## 2018-02-24 DIAGNOSIS — D509 Iron deficiency anemia, unspecified: Secondary | ICD-10-CM | POA: Diagnosis not present

## 2018-02-24 DIAGNOSIS — R1110 Vomiting, unspecified: Secondary | ICD-10-CM | POA: Diagnosis not present

## 2018-02-24 DIAGNOSIS — Z992 Dependence on renal dialysis: Secondary | ICD-10-CM | POA: Diagnosis not present

## 2018-02-26 DIAGNOSIS — Z992 Dependence on renal dialysis: Secondary | ICD-10-CM | POA: Diagnosis not present

## 2018-02-26 DIAGNOSIS — D509 Iron deficiency anemia, unspecified: Secondary | ICD-10-CM | POA: Diagnosis not present

## 2018-02-26 DIAGNOSIS — R1110 Vomiting, unspecified: Secondary | ICD-10-CM | POA: Diagnosis not present

## 2018-02-26 DIAGNOSIS — N2581 Secondary hyperparathyroidism of renal origin: Secondary | ICD-10-CM | POA: Diagnosis not present

## 2018-02-26 DIAGNOSIS — R110 Nausea: Secondary | ICD-10-CM | POA: Diagnosis not present

## 2018-02-26 DIAGNOSIS — N186 End stage renal disease: Secondary | ICD-10-CM | POA: Diagnosis not present

## 2018-02-28 DIAGNOSIS — N186 End stage renal disease: Secondary | ICD-10-CM | POA: Diagnosis not present

## 2018-02-28 DIAGNOSIS — R1110 Vomiting, unspecified: Secondary | ICD-10-CM | POA: Diagnosis not present

## 2018-02-28 DIAGNOSIS — N2581 Secondary hyperparathyroidism of renal origin: Secondary | ICD-10-CM | POA: Diagnosis not present

## 2018-02-28 DIAGNOSIS — Z992 Dependence on renal dialysis: Secondary | ICD-10-CM | POA: Diagnosis not present

## 2018-02-28 DIAGNOSIS — R110 Nausea: Secondary | ICD-10-CM | POA: Diagnosis not present

## 2018-02-28 DIAGNOSIS — D509 Iron deficiency anemia, unspecified: Secondary | ICD-10-CM | POA: Diagnosis not present

## 2018-03-03 DIAGNOSIS — R1110 Vomiting, unspecified: Secondary | ICD-10-CM | POA: Diagnosis not present

## 2018-03-03 DIAGNOSIS — D509 Iron deficiency anemia, unspecified: Secondary | ICD-10-CM | POA: Diagnosis not present

## 2018-03-03 DIAGNOSIS — R110 Nausea: Secondary | ICD-10-CM | POA: Diagnosis not present

## 2018-03-03 DIAGNOSIS — N2581 Secondary hyperparathyroidism of renal origin: Secondary | ICD-10-CM | POA: Diagnosis not present

## 2018-03-03 DIAGNOSIS — Z992 Dependence on renal dialysis: Secondary | ICD-10-CM | POA: Diagnosis not present

## 2018-03-03 DIAGNOSIS — N186 End stage renal disease: Secondary | ICD-10-CM | POA: Diagnosis not present

## 2018-03-05 DIAGNOSIS — R1110 Vomiting, unspecified: Secondary | ICD-10-CM | POA: Diagnosis not present

## 2018-03-05 DIAGNOSIS — Z992 Dependence on renal dialysis: Secondary | ICD-10-CM | POA: Diagnosis not present

## 2018-03-05 DIAGNOSIS — R110 Nausea: Secondary | ICD-10-CM | POA: Diagnosis not present

## 2018-03-05 DIAGNOSIS — D509 Iron deficiency anemia, unspecified: Secondary | ICD-10-CM | POA: Diagnosis not present

## 2018-03-05 DIAGNOSIS — N2581 Secondary hyperparathyroidism of renal origin: Secondary | ICD-10-CM | POA: Diagnosis not present

## 2018-03-05 DIAGNOSIS — N186 End stage renal disease: Secondary | ICD-10-CM | POA: Diagnosis not present

## 2018-03-07 DIAGNOSIS — D509 Iron deficiency anemia, unspecified: Secondary | ICD-10-CM | POA: Diagnosis not present

## 2018-03-07 DIAGNOSIS — N186 End stage renal disease: Secondary | ICD-10-CM | POA: Diagnosis not present

## 2018-03-07 DIAGNOSIS — R1110 Vomiting, unspecified: Secondary | ICD-10-CM | POA: Diagnosis not present

## 2018-03-07 DIAGNOSIS — R110 Nausea: Secondary | ICD-10-CM | POA: Diagnosis not present

## 2018-03-07 DIAGNOSIS — N2581 Secondary hyperparathyroidism of renal origin: Secondary | ICD-10-CM | POA: Diagnosis not present

## 2018-03-07 DIAGNOSIS — Z992 Dependence on renal dialysis: Secondary | ICD-10-CM | POA: Diagnosis not present

## 2018-03-12 DIAGNOSIS — Z992 Dependence on renal dialysis: Secondary | ICD-10-CM | POA: Diagnosis not present

## 2018-03-12 DIAGNOSIS — N2581 Secondary hyperparathyroidism of renal origin: Secondary | ICD-10-CM | POA: Diagnosis not present

## 2018-03-12 DIAGNOSIS — D631 Anemia in chronic kidney disease: Secondary | ICD-10-CM | POA: Diagnosis not present

## 2018-03-12 DIAGNOSIS — D509 Iron deficiency anemia, unspecified: Secondary | ICD-10-CM | POA: Diagnosis not present

## 2018-03-12 DIAGNOSIS — N186 End stage renal disease: Secondary | ICD-10-CM | POA: Diagnosis not present

## 2018-03-14 DIAGNOSIS — N2581 Secondary hyperparathyroidism of renal origin: Secondary | ICD-10-CM | POA: Diagnosis not present

## 2018-03-14 DIAGNOSIS — D631 Anemia in chronic kidney disease: Secondary | ICD-10-CM | POA: Diagnosis not present

## 2018-03-14 DIAGNOSIS — D509 Iron deficiency anemia, unspecified: Secondary | ICD-10-CM | POA: Diagnosis not present

## 2018-03-14 DIAGNOSIS — Z992 Dependence on renal dialysis: Secondary | ICD-10-CM | POA: Diagnosis not present

## 2018-03-14 DIAGNOSIS — N186 End stage renal disease: Secondary | ICD-10-CM | POA: Diagnosis not present

## 2018-03-17 DIAGNOSIS — D509 Iron deficiency anemia, unspecified: Secondary | ICD-10-CM | POA: Diagnosis not present

## 2018-03-17 DIAGNOSIS — N186 End stage renal disease: Secondary | ICD-10-CM | POA: Diagnosis not present

## 2018-03-17 DIAGNOSIS — Z992 Dependence on renal dialysis: Secondary | ICD-10-CM | POA: Diagnosis not present

## 2018-03-17 DIAGNOSIS — D631 Anemia in chronic kidney disease: Secondary | ICD-10-CM | POA: Diagnosis not present

## 2018-03-17 DIAGNOSIS — N2581 Secondary hyperparathyroidism of renal origin: Secondary | ICD-10-CM | POA: Diagnosis not present

## 2018-03-19 DIAGNOSIS — Z992 Dependence on renal dialysis: Secondary | ICD-10-CM | POA: Diagnosis not present

## 2018-03-19 DIAGNOSIS — D509 Iron deficiency anemia, unspecified: Secondary | ICD-10-CM | POA: Diagnosis not present

## 2018-03-19 DIAGNOSIS — N2581 Secondary hyperparathyroidism of renal origin: Secondary | ICD-10-CM | POA: Diagnosis not present

## 2018-03-19 DIAGNOSIS — N186 End stage renal disease: Secondary | ICD-10-CM | POA: Diagnosis not present

## 2018-03-19 DIAGNOSIS — D631 Anemia in chronic kidney disease: Secondary | ICD-10-CM | POA: Diagnosis not present

## 2018-03-21 DIAGNOSIS — D509 Iron deficiency anemia, unspecified: Secondary | ICD-10-CM | POA: Diagnosis not present

## 2018-03-21 DIAGNOSIS — N2581 Secondary hyperparathyroidism of renal origin: Secondary | ICD-10-CM | POA: Diagnosis not present

## 2018-03-21 DIAGNOSIS — Z992 Dependence on renal dialysis: Secondary | ICD-10-CM | POA: Diagnosis not present

## 2018-03-21 DIAGNOSIS — N186 End stage renal disease: Secondary | ICD-10-CM | POA: Diagnosis not present

## 2018-03-21 DIAGNOSIS — D631 Anemia in chronic kidney disease: Secondary | ICD-10-CM | POA: Diagnosis not present

## 2018-03-24 DIAGNOSIS — N2581 Secondary hyperparathyroidism of renal origin: Secondary | ICD-10-CM | POA: Diagnosis not present

## 2018-03-24 DIAGNOSIS — N186 End stage renal disease: Secondary | ICD-10-CM | POA: Diagnosis not present

## 2018-03-24 DIAGNOSIS — D631 Anemia in chronic kidney disease: Secondary | ICD-10-CM | POA: Diagnosis not present

## 2018-03-24 DIAGNOSIS — D509 Iron deficiency anemia, unspecified: Secondary | ICD-10-CM | POA: Diagnosis not present

## 2018-03-24 DIAGNOSIS — Z992 Dependence on renal dialysis: Secondary | ICD-10-CM | POA: Diagnosis not present

## 2018-03-26 DIAGNOSIS — Z992 Dependence on renal dialysis: Secondary | ICD-10-CM | POA: Diagnosis not present

## 2018-03-26 DIAGNOSIS — D509 Iron deficiency anemia, unspecified: Secondary | ICD-10-CM | POA: Diagnosis not present

## 2018-03-26 DIAGNOSIS — N2581 Secondary hyperparathyroidism of renal origin: Secondary | ICD-10-CM | POA: Diagnosis not present

## 2018-03-26 DIAGNOSIS — N186 End stage renal disease: Secondary | ICD-10-CM | POA: Diagnosis not present

## 2018-03-26 DIAGNOSIS — D631 Anemia in chronic kidney disease: Secondary | ICD-10-CM | POA: Diagnosis not present

## 2018-03-31 DIAGNOSIS — Z992 Dependence on renal dialysis: Secondary | ICD-10-CM | POA: Diagnosis not present

## 2018-03-31 DIAGNOSIS — D631 Anemia in chronic kidney disease: Secondary | ICD-10-CM | POA: Diagnosis not present

## 2018-03-31 DIAGNOSIS — N186 End stage renal disease: Secondary | ICD-10-CM | POA: Diagnosis not present

## 2018-03-31 DIAGNOSIS — D509 Iron deficiency anemia, unspecified: Secondary | ICD-10-CM | POA: Diagnosis not present

## 2018-03-31 DIAGNOSIS — N2581 Secondary hyperparathyroidism of renal origin: Secondary | ICD-10-CM | POA: Diagnosis not present

## 2018-04-02 DIAGNOSIS — N2581 Secondary hyperparathyroidism of renal origin: Secondary | ICD-10-CM | POA: Diagnosis not present

## 2018-04-02 DIAGNOSIS — D509 Iron deficiency anemia, unspecified: Secondary | ICD-10-CM | POA: Diagnosis not present

## 2018-04-02 DIAGNOSIS — N186 End stage renal disease: Secondary | ICD-10-CM | POA: Diagnosis not present

## 2018-04-02 DIAGNOSIS — D631 Anemia in chronic kidney disease: Secondary | ICD-10-CM | POA: Diagnosis not present

## 2018-04-02 DIAGNOSIS — Z992 Dependence on renal dialysis: Secondary | ICD-10-CM | POA: Diagnosis not present

## 2018-04-03 ENCOUNTER — Ambulatory Visit (INDEPENDENT_AMBULATORY_CARE_PROVIDER_SITE_OTHER): Payer: Medicare Other | Admitting: Vascular Surgery

## 2018-04-03 ENCOUNTER — Encounter (INDEPENDENT_AMBULATORY_CARE_PROVIDER_SITE_OTHER): Payer: Medicare Other

## 2018-04-04 DIAGNOSIS — D509 Iron deficiency anemia, unspecified: Secondary | ICD-10-CM | POA: Diagnosis not present

## 2018-04-04 DIAGNOSIS — Z992 Dependence on renal dialysis: Secondary | ICD-10-CM | POA: Diagnosis not present

## 2018-04-04 DIAGNOSIS — D631 Anemia in chronic kidney disease: Secondary | ICD-10-CM | POA: Diagnosis not present

## 2018-04-04 DIAGNOSIS — N186 End stage renal disease: Secondary | ICD-10-CM | POA: Diagnosis not present

## 2018-04-04 DIAGNOSIS — N2581 Secondary hyperparathyroidism of renal origin: Secondary | ICD-10-CM | POA: Diagnosis not present

## 2018-04-06 DIAGNOSIS — N186 End stage renal disease: Secondary | ICD-10-CM | POA: Diagnosis not present

## 2018-04-06 DIAGNOSIS — Z992 Dependence on renal dialysis: Secondary | ICD-10-CM | POA: Diagnosis not present

## 2018-04-07 DIAGNOSIS — Z992 Dependence on renal dialysis: Secondary | ICD-10-CM | POA: Diagnosis not present

## 2018-04-07 DIAGNOSIS — D631 Anemia in chronic kidney disease: Secondary | ICD-10-CM | POA: Diagnosis not present

## 2018-04-07 DIAGNOSIS — N186 End stage renal disease: Secondary | ICD-10-CM | POA: Diagnosis not present

## 2018-04-07 DIAGNOSIS — D509 Iron deficiency anemia, unspecified: Secondary | ICD-10-CM | POA: Diagnosis not present

## 2018-04-07 DIAGNOSIS — N2581 Secondary hyperparathyroidism of renal origin: Secondary | ICD-10-CM | POA: Diagnosis not present

## 2018-04-09 DIAGNOSIS — N2581 Secondary hyperparathyroidism of renal origin: Secondary | ICD-10-CM | POA: Diagnosis not present

## 2018-04-09 DIAGNOSIS — Z992 Dependence on renal dialysis: Secondary | ICD-10-CM | POA: Diagnosis not present

## 2018-04-09 DIAGNOSIS — D631 Anemia in chronic kidney disease: Secondary | ICD-10-CM | POA: Diagnosis not present

## 2018-04-09 DIAGNOSIS — N186 End stage renal disease: Secondary | ICD-10-CM | POA: Diagnosis not present

## 2018-04-09 DIAGNOSIS — D509 Iron deficiency anemia, unspecified: Secondary | ICD-10-CM | POA: Diagnosis not present

## 2018-04-11 DIAGNOSIS — D509 Iron deficiency anemia, unspecified: Secondary | ICD-10-CM | POA: Diagnosis not present

## 2018-04-11 DIAGNOSIS — N186 End stage renal disease: Secondary | ICD-10-CM | POA: Diagnosis not present

## 2018-04-11 DIAGNOSIS — N2581 Secondary hyperparathyroidism of renal origin: Secondary | ICD-10-CM | POA: Diagnosis not present

## 2018-04-11 DIAGNOSIS — Z992 Dependence on renal dialysis: Secondary | ICD-10-CM | POA: Diagnosis not present

## 2018-04-11 DIAGNOSIS — D631 Anemia in chronic kidney disease: Secondary | ICD-10-CM | POA: Diagnosis not present

## 2018-04-14 DIAGNOSIS — N2581 Secondary hyperparathyroidism of renal origin: Secondary | ICD-10-CM | POA: Diagnosis not present

## 2018-04-14 DIAGNOSIS — E119 Type 2 diabetes mellitus without complications: Secondary | ICD-10-CM | POA: Diagnosis not present

## 2018-04-14 DIAGNOSIS — Z794 Long term (current) use of insulin: Secondary | ICD-10-CM | POA: Diagnosis not present

## 2018-04-14 DIAGNOSIS — D631 Anemia in chronic kidney disease: Secondary | ICD-10-CM | POA: Diagnosis not present

## 2018-04-14 DIAGNOSIS — Z992 Dependence on renal dialysis: Secondary | ICD-10-CM | POA: Diagnosis not present

## 2018-04-14 DIAGNOSIS — N186 End stage renal disease: Secondary | ICD-10-CM | POA: Diagnosis not present

## 2018-04-14 DIAGNOSIS — D509 Iron deficiency anemia, unspecified: Secondary | ICD-10-CM | POA: Diagnosis not present

## 2018-04-16 DIAGNOSIS — D631 Anemia in chronic kidney disease: Secondary | ICD-10-CM | POA: Diagnosis not present

## 2018-04-16 DIAGNOSIS — N186 End stage renal disease: Secondary | ICD-10-CM | POA: Diagnosis not present

## 2018-04-16 DIAGNOSIS — Z992 Dependence on renal dialysis: Secondary | ICD-10-CM | POA: Diagnosis not present

## 2018-04-16 DIAGNOSIS — D509 Iron deficiency anemia, unspecified: Secondary | ICD-10-CM | POA: Diagnosis not present

## 2018-04-16 DIAGNOSIS — N2581 Secondary hyperparathyroidism of renal origin: Secondary | ICD-10-CM | POA: Diagnosis not present

## 2018-04-18 DIAGNOSIS — D509 Iron deficiency anemia, unspecified: Secondary | ICD-10-CM | POA: Diagnosis not present

## 2018-04-18 DIAGNOSIS — N2581 Secondary hyperparathyroidism of renal origin: Secondary | ICD-10-CM | POA: Diagnosis not present

## 2018-04-18 DIAGNOSIS — N186 End stage renal disease: Secondary | ICD-10-CM | POA: Diagnosis not present

## 2018-04-18 DIAGNOSIS — Z992 Dependence on renal dialysis: Secondary | ICD-10-CM | POA: Diagnosis not present

## 2018-04-18 DIAGNOSIS — D631 Anemia in chronic kidney disease: Secondary | ICD-10-CM | POA: Diagnosis not present

## 2018-04-21 DIAGNOSIS — N2581 Secondary hyperparathyroidism of renal origin: Secondary | ICD-10-CM | POA: Diagnosis not present

## 2018-04-21 DIAGNOSIS — N186 End stage renal disease: Secondary | ICD-10-CM | POA: Diagnosis not present

## 2018-04-21 DIAGNOSIS — Z992 Dependence on renal dialysis: Secondary | ICD-10-CM | POA: Diagnosis not present

## 2018-04-21 DIAGNOSIS — D509 Iron deficiency anemia, unspecified: Secondary | ICD-10-CM | POA: Diagnosis not present

## 2018-04-21 DIAGNOSIS — D631 Anemia in chronic kidney disease: Secondary | ICD-10-CM | POA: Diagnosis not present

## 2018-04-23 DIAGNOSIS — N186 End stage renal disease: Secondary | ICD-10-CM | POA: Diagnosis not present

## 2018-04-23 DIAGNOSIS — N2581 Secondary hyperparathyroidism of renal origin: Secondary | ICD-10-CM | POA: Diagnosis not present

## 2018-04-23 DIAGNOSIS — Z992 Dependence on renal dialysis: Secondary | ICD-10-CM | POA: Diagnosis not present

## 2018-04-23 DIAGNOSIS — D631 Anemia in chronic kidney disease: Secondary | ICD-10-CM | POA: Diagnosis not present

## 2018-04-23 DIAGNOSIS — D509 Iron deficiency anemia, unspecified: Secondary | ICD-10-CM | POA: Diagnosis not present

## 2018-04-25 DIAGNOSIS — D509 Iron deficiency anemia, unspecified: Secondary | ICD-10-CM | POA: Diagnosis not present

## 2018-04-25 DIAGNOSIS — Z992 Dependence on renal dialysis: Secondary | ICD-10-CM | POA: Diagnosis not present

## 2018-04-25 DIAGNOSIS — N2581 Secondary hyperparathyroidism of renal origin: Secondary | ICD-10-CM | POA: Diagnosis not present

## 2018-04-25 DIAGNOSIS — D631 Anemia in chronic kidney disease: Secondary | ICD-10-CM | POA: Diagnosis not present

## 2018-04-25 DIAGNOSIS — N186 End stage renal disease: Secondary | ICD-10-CM | POA: Diagnosis not present

## 2018-04-28 DIAGNOSIS — N2581 Secondary hyperparathyroidism of renal origin: Secondary | ICD-10-CM | POA: Diagnosis not present

## 2018-04-28 DIAGNOSIS — D631 Anemia in chronic kidney disease: Secondary | ICD-10-CM | POA: Diagnosis not present

## 2018-04-28 DIAGNOSIS — Z992 Dependence on renal dialysis: Secondary | ICD-10-CM | POA: Diagnosis not present

## 2018-04-28 DIAGNOSIS — D509 Iron deficiency anemia, unspecified: Secondary | ICD-10-CM | POA: Diagnosis not present

## 2018-04-28 DIAGNOSIS — N186 End stage renal disease: Secondary | ICD-10-CM | POA: Diagnosis not present

## 2018-04-30 DIAGNOSIS — N2581 Secondary hyperparathyroidism of renal origin: Secondary | ICD-10-CM | POA: Diagnosis not present

## 2018-04-30 DIAGNOSIS — D509 Iron deficiency anemia, unspecified: Secondary | ICD-10-CM | POA: Diagnosis not present

## 2018-04-30 DIAGNOSIS — N186 End stage renal disease: Secondary | ICD-10-CM | POA: Diagnosis not present

## 2018-04-30 DIAGNOSIS — D631 Anemia in chronic kidney disease: Secondary | ICD-10-CM | POA: Diagnosis not present

## 2018-04-30 DIAGNOSIS — Z992 Dependence on renal dialysis: Secondary | ICD-10-CM | POA: Diagnosis not present

## 2018-05-02 DIAGNOSIS — D631 Anemia in chronic kidney disease: Secondary | ICD-10-CM | POA: Diagnosis not present

## 2018-05-02 DIAGNOSIS — N2581 Secondary hyperparathyroidism of renal origin: Secondary | ICD-10-CM | POA: Diagnosis not present

## 2018-05-02 DIAGNOSIS — N186 End stage renal disease: Secondary | ICD-10-CM | POA: Diagnosis not present

## 2018-05-02 DIAGNOSIS — D509 Iron deficiency anemia, unspecified: Secondary | ICD-10-CM | POA: Diagnosis not present

## 2018-05-02 DIAGNOSIS — Z992 Dependence on renal dialysis: Secondary | ICD-10-CM | POA: Diagnosis not present

## 2018-05-07 DIAGNOSIS — D631 Anemia in chronic kidney disease: Secondary | ICD-10-CM | POA: Diagnosis not present

## 2018-05-07 DIAGNOSIS — N186 End stage renal disease: Secondary | ICD-10-CM | POA: Diagnosis not present

## 2018-05-07 DIAGNOSIS — Z992 Dependence on renal dialysis: Secondary | ICD-10-CM | POA: Diagnosis not present

## 2018-05-07 DIAGNOSIS — N2581 Secondary hyperparathyroidism of renal origin: Secondary | ICD-10-CM | POA: Diagnosis not present

## 2018-05-07 DIAGNOSIS — D509 Iron deficiency anemia, unspecified: Secondary | ICD-10-CM | POA: Diagnosis not present

## 2018-05-09 DIAGNOSIS — D509 Iron deficiency anemia, unspecified: Secondary | ICD-10-CM | POA: Diagnosis not present

## 2018-05-09 DIAGNOSIS — Z992 Dependence on renal dialysis: Secondary | ICD-10-CM | POA: Diagnosis not present

## 2018-05-09 DIAGNOSIS — N2581 Secondary hyperparathyroidism of renal origin: Secondary | ICD-10-CM | POA: Diagnosis not present

## 2018-05-09 DIAGNOSIS — D631 Anemia in chronic kidney disease: Secondary | ICD-10-CM | POA: Diagnosis not present

## 2018-05-09 DIAGNOSIS — N186 End stage renal disease: Secondary | ICD-10-CM | POA: Diagnosis not present

## 2018-05-12 DIAGNOSIS — N186 End stage renal disease: Secondary | ICD-10-CM | POA: Diagnosis not present

## 2018-05-12 DIAGNOSIS — D631 Anemia in chronic kidney disease: Secondary | ICD-10-CM | POA: Diagnosis not present

## 2018-05-12 DIAGNOSIS — D509 Iron deficiency anemia, unspecified: Secondary | ICD-10-CM | POA: Diagnosis not present

## 2018-05-12 DIAGNOSIS — Z992 Dependence on renal dialysis: Secondary | ICD-10-CM | POA: Diagnosis not present

## 2018-05-12 DIAGNOSIS — N2581 Secondary hyperparathyroidism of renal origin: Secondary | ICD-10-CM | POA: Diagnosis not present

## 2018-05-16 DIAGNOSIS — N2581 Secondary hyperparathyroidism of renal origin: Secondary | ICD-10-CM | POA: Diagnosis not present

## 2018-05-16 DIAGNOSIS — D631 Anemia in chronic kidney disease: Secondary | ICD-10-CM | POA: Diagnosis not present

## 2018-05-16 DIAGNOSIS — N186 End stage renal disease: Secondary | ICD-10-CM | POA: Diagnosis not present

## 2018-05-16 DIAGNOSIS — D509 Iron deficiency anemia, unspecified: Secondary | ICD-10-CM | POA: Diagnosis not present

## 2018-05-16 DIAGNOSIS — Z992 Dependence on renal dialysis: Secondary | ICD-10-CM | POA: Diagnosis not present

## 2018-05-19 DIAGNOSIS — D509 Iron deficiency anemia, unspecified: Secondary | ICD-10-CM | POA: Diagnosis not present

## 2018-05-19 DIAGNOSIS — N186 End stage renal disease: Secondary | ICD-10-CM | POA: Diagnosis not present

## 2018-05-19 DIAGNOSIS — Z992 Dependence on renal dialysis: Secondary | ICD-10-CM | POA: Diagnosis not present

## 2018-05-19 DIAGNOSIS — N2581 Secondary hyperparathyroidism of renal origin: Secondary | ICD-10-CM | POA: Diagnosis not present

## 2018-05-19 DIAGNOSIS — D631 Anemia in chronic kidney disease: Secondary | ICD-10-CM | POA: Diagnosis not present

## 2018-05-21 DIAGNOSIS — Z992 Dependence on renal dialysis: Secondary | ICD-10-CM | POA: Diagnosis not present

## 2018-05-21 DIAGNOSIS — D631 Anemia in chronic kidney disease: Secondary | ICD-10-CM | POA: Diagnosis not present

## 2018-05-21 DIAGNOSIS — N186 End stage renal disease: Secondary | ICD-10-CM | POA: Diagnosis not present

## 2018-05-21 DIAGNOSIS — N2581 Secondary hyperparathyroidism of renal origin: Secondary | ICD-10-CM | POA: Diagnosis not present

## 2018-05-21 DIAGNOSIS — D509 Iron deficiency anemia, unspecified: Secondary | ICD-10-CM | POA: Diagnosis not present

## 2018-05-23 DIAGNOSIS — N2581 Secondary hyperparathyroidism of renal origin: Secondary | ICD-10-CM | POA: Diagnosis not present

## 2018-05-23 DIAGNOSIS — D509 Iron deficiency anemia, unspecified: Secondary | ICD-10-CM | POA: Diagnosis not present

## 2018-05-23 DIAGNOSIS — N186 End stage renal disease: Secondary | ICD-10-CM | POA: Diagnosis not present

## 2018-05-23 DIAGNOSIS — D631 Anemia in chronic kidney disease: Secondary | ICD-10-CM | POA: Diagnosis not present

## 2018-05-23 DIAGNOSIS — Z992 Dependence on renal dialysis: Secondary | ICD-10-CM | POA: Diagnosis not present

## 2018-05-26 DIAGNOSIS — D509 Iron deficiency anemia, unspecified: Secondary | ICD-10-CM | POA: Diagnosis not present

## 2018-05-26 DIAGNOSIS — N2581 Secondary hyperparathyroidism of renal origin: Secondary | ICD-10-CM | POA: Diagnosis not present

## 2018-05-26 DIAGNOSIS — D631 Anemia in chronic kidney disease: Secondary | ICD-10-CM | POA: Diagnosis not present

## 2018-05-26 DIAGNOSIS — N186 End stage renal disease: Secondary | ICD-10-CM | POA: Diagnosis not present

## 2018-05-26 DIAGNOSIS — Z992 Dependence on renal dialysis: Secondary | ICD-10-CM | POA: Diagnosis not present

## 2018-05-28 DIAGNOSIS — N2581 Secondary hyperparathyroidism of renal origin: Secondary | ICD-10-CM | POA: Diagnosis not present

## 2018-05-28 DIAGNOSIS — N186 End stage renal disease: Secondary | ICD-10-CM | POA: Diagnosis not present

## 2018-05-28 DIAGNOSIS — D631 Anemia in chronic kidney disease: Secondary | ICD-10-CM | POA: Diagnosis not present

## 2018-05-28 DIAGNOSIS — Z992 Dependence on renal dialysis: Secondary | ICD-10-CM | POA: Diagnosis not present

## 2018-05-28 DIAGNOSIS — D509 Iron deficiency anemia, unspecified: Secondary | ICD-10-CM | POA: Diagnosis not present

## 2018-05-30 DIAGNOSIS — D631 Anemia in chronic kidney disease: Secondary | ICD-10-CM | POA: Diagnosis not present

## 2018-05-30 DIAGNOSIS — Z992 Dependence on renal dialysis: Secondary | ICD-10-CM | POA: Diagnosis not present

## 2018-05-30 DIAGNOSIS — D509 Iron deficiency anemia, unspecified: Secondary | ICD-10-CM | POA: Diagnosis not present

## 2018-05-30 DIAGNOSIS — N186 End stage renal disease: Secondary | ICD-10-CM | POA: Diagnosis not present

## 2018-05-30 DIAGNOSIS — N2581 Secondary hyperparathyroidism of renal origin: Secondary | ICD-10-CM | POA: Diagnosis not present

## 2018-06-05 DIAGNOSIS — N186 End stage renal disease: Secondary | ICD-10-CM | POA: Diagnosis not present

## 2018-06-05 DIAGNOSIS — Z992 Dependence on renal dialysis: Secondary | ICD-10-CM | POA: Diagnosis not present

## 2018-06-05 DIAGNOSIS — D509 Iron deficiency anemia, unspecified: Secondary | ICD-10-CM | POA: Diagnosis not present

## 2018-06-05 DIAGNOSIS — D631 Anemia in chronic kidney disease: Secondary | ICD-10-CM | POA: Diagnosis not present

## 2018-06-05 DIAGNOSIS — N2581 Secondary hyperparathyroidism of renal origin: Secondary | ICD-10-CM | POA: Diagnosis not present

## 2018-06-07 DIAGNOSIS — N186 End stage renal disease: Secondary | ICD-10-CM | POA: Diagnosis not present

## 2018-06-07 DIAGNOSIS — Z992 Dependence on renal dialysis: Secondary | ICD-10-CM | POA: Diagnosis not present

## 2018-06-09 DIAGNOSIS — N186 End stage renal disease: Secondary | ICD-10-CM | POA: Diagnosis not present

## 2018-06-09 DIAGNOSIS — Z992 Dependence on renal dialysis: Secondary | ICD-10-CM | POA: Diagnosis not present

## 2018-06-09 DIAGNOSIS — D509 Iron deficiency anemia, unspecified: Secondary | ICD-10-CM | POA: Diagnosis not present

## 2018-06-09 DIAGNOSIS — N2581 Secondary hyperparathyroidism of renal origin: Secondary | ICD-10-CM | POA: Diagnosis not present

## 2018-06-09 DIAGNOSIS — D631 Anemia in chronic kidney disease: Secondary | ICD-10-CM | POA: Diagnosis not present

## 2018-06-11 DIAGNOSIS — D509 Iron deficiency anemia, unspecified: Secondary | ICD-10-CM | POA: Diagnosis not present

## 2018-06-11 DIAGNOSIS — N2581 Secondary hyperparathyroidism of renal origin: Secondary | ICD-10-CM | POA: Diagnosis not present

## 2018-06-11 DIAGNOSIS — N186 End stage renal disease: Secondary | ICD-10-CM | POA: Diagnosis not present

## 2018-06-11 DIAGNOSIS — Z992 Dependence on renal dialysis: Secondary | ICD-10-CM | POA: Diagnosis not present

## 2018-06-11 DIAGNOSIS — D631 Anemia in chronic kidney disease: Secondary | ICD-10-CM | POA: Diagnosis not present

## 2018-06-16 ENCOUNTER — Telehealth (INDEPENDENT_AMBULATORY_CARE_PROVIDER_SITE_OTHER): Payer: Self-pay

## 2018-06-16 NOTE — Telephone Encounter (Signed)
Heather called and stated that the patient is clotted and that he is requesting that we do the declot, she would like to know about the next available time that he can be declotted?  I called Heather to have her send the order over. His dialysis days are Mon-Wed-Fri.

## 2018-06-17 ENCOUNTER — Other Ambulatory Visit (INDEPENDENT_AMBULATORY_CARE_PROVIDER_SITE_OTHER): Payer: Self-pay | Admitting: Nurse Practitioner

## 2018-06-17 ENCOUNTER — Encounter (INDEPENDENT_AMBULATORY_CARE_PROVIDER_SITE_OTHER): Payer: Self-pay

## 2018-06-17 MED ORDER — CEFAZOLIN SODIUM-DEXTROSE 1-4 GM/50ML-% IV SOLN
1.0000 g | Freq: Once | INTRAVENOUS | Status: AC
  Administered 2018-06-18: 1 g via INTRAVENOUS

## 2018-06-18 ENCOUNTER — Ambulatory Visit: Payer: Medicare Other | Admitting: Anesthesiology

## 2018-06-18 ENCOUNTER — Encounter
Admission: RE | Disposition: A | Discharge status: Home / Self Care | Payer: Self-pay | Source: Ambulatory Visit | Attending: Vascular Surgery

## 2018-06-18 ENCOUNTER — Ambulatory Visit
Admission: RE | Admit: 2018-06-18 | Discharge: 2018-06-18 | Disposition: A | Discharge status: Home / Self Care | Payer: Medicare Other | Source: Ambulatory Visit | Attending: Vascular Surgery | Admitting: Vascular Surgery

## 2018-06-18 ENCOUNTER — Encounter: Payer: Self-pay | Admitting: *Deleted

## 2018-06-18 DIAGNOSIS — Z992 Dependence on renal dialysis: Secondary | ICD-10-CM | POA: Insufficient documentation

## 2018-06-18 DIAGNOSIS — Z885 Allergy status to narcotic agent status: Secondary | ICD-10-CM

## 2018-06-18 DIAGNOSIS — E7800 Pure hypercholesterolemia, unspecified: Secondary | ICD-10-CM

## 2018-06-18 DIAGNOSIS — E1140 Type 2 diabetes mellitus with diabetic neuropathy, unspecified: Secondary | ICD-10-CM

## 2018-06-18 DIAGNOSIS — Z8249 Family history of ischemic heart disease and other diseases of the circulatory system: Secondary | ICD-10-CM

## 2018-06-18 DIAGNOSIS — I120 Hypertensive chronic kidney disease with stage 5 chronic kidney disease or end stage renal disease: Secondary | ICD-10-CM

## 2018-06-18 DIAGNOSIS — G4730 Sleep apnea, unspecified: Secondary | ICD-10-CM

## 2018-06-18 DIAGNOSIS — N186 End stage renal disease: Secondary | ICD-10-CM

## 2018-06-18 DIAGNOSIS — T82868A Thrombosis of vascular prosthetic devices, implants and grafts, initial encounter: Secondary | ICD-10-CM | POA: Insufficient documentation

## 2018-06-18 DIAGNOSIS — I10 Essential (primary) hypertension: Secondary | ICD-10-CM | POA: Diagnosis not present

## 2018-06-18 DIAGNOSIS — F17220 Nicotine dependence, chewing tobacco, uncomplicated: Secondary | ICD-10-CM

## 2018-06-18 DIAGNOSIS — E1122 Type 2 diabetes mellitus with diabetic chronic kidney disease: Secondary | ICD-10-CM | POA: Insufficient documentation

## 2018-06-18 DIAGNOSIS — E119 Type 2 diabetes mellitus without complications: Secondary | ICD-10-CM | POA: Diagnosis not present

## 2018-06-18 DIAGNOSIS — Y832 Surgical operation with anastomosis, bypass or graft as the cause of abnormal reaction of the patient, or of later complication, without mention of misadventure at the time of the procedure: Secondary | ICD-10-CM | POA: Insufficient documentation

## 2018-06-18 DIAGNOSIS — K219 Gastro-esophageal reflux disease without esophagitis: Secondary | ICD-10-CM

## 2018-06-18 DIAGNOSIS — Z9889 Other specified postprocedural states: Secondary | ICD-10-CM | POA: Insufficient documentation

## 2018-06-18 HISTORY — PX: PERIPHERAL VASCULAR THROMBECTOMY: CATH118306

## 2018-06-18 LAB — GLUCOSE, CAPILLARY
GLUCOSE-CAPILLARY: 128 mg/dL — AB (ref 70–99)
Glucose-Capillary: 146 mg/dL — ABNORMAL HIGH (ref 70–99)

## 2018-06-18 LAB — POTASSIUM (ARMC VASCULAR LAB ONLY): POTASSIUM (ARMC VASCULAR LAB): 3.5 (ref 3.5–5.1)

## 2018-06-18 SURGERY — PERIPHERAL VASCULAR THROMBECTOMY
Anesthesia: General | Laterality: Right

## 2018-06-18 MED ORDER — LIDOCAINE-EPINEPHRINE (PF) 1 %-1:200000 IJ SOLN
INTRAMUSCULAR | Status: AC
  Filled 2018-06-18: qty 30

## 2018-06-18 MED ORDER — LACTATED RINGERS IV SOLN: INTRAVENOUS | Status: DC | PRN

## 2018-06-18 MED ORDER — HEPARIN SODIUM (PORCINE) 1000 UNIT/ML IJ SOLN
INTRAMUSCULAR | Status: AC
  Filled 2018-06-18: qty 1

## 2018-06-18 MED ORDER — PROMETHAZINE HCL 25 MG/ML IJ SOLN: 6.2500 mg | INTRAMUSCULAR | Status: DC | PRN

## 2018-06-18 MED ORDER — PROPOFOL 10 MG/ML IV BOLUS
INTRAVENOUS | Status: DC | PRN
  Administered 2018-06-18: 30 mg via INTRAVENOUS
  Administered 2018-06-18: 150 mg via INTRAVENOUS
  Administered 2018-06-18: 50 mg via INTRAVENOUS

## 2018-06-18 MED ORDER — ACETAMINOPHEN 160 MG/5ML PO SOLN
325.0000 mg | ORAL | Status: DC | PRN
  Filled 2018-06-18: qty 20.3

## 2018-06-18 MED ORDER — LIDOCAINE HCL (CARDIAC) PF 100 MG/5ML IV SOSY
PREFILLED_SYRINGE | INTRAVENOUS | Status: DC | PRN
  Administered 2018-06-18: 40 mg via INTRAVENOUS

## 2018-06-18 MED ORDER — PROPOFOL 10 MG/ML IV BOLUS
INTRAVENOUS | Status: AC
  Filled 2018-06-18: qty 20

## 2018-06-18 MED ORDER — HEPARIN (PORCINE) IN NACL 1000-0.9 UT/500ML-% IV SOLN
INTRAVENOUS | Status: AC
  Filled 2018-06-18: qty 1000

## 2018-06-18 MED ORDER — ROCURONIUM BROMIDE 50 MG/5ML IV SOLN
INTRAVENOUS | Status: AC
  Filled 2018-06-18: qty 1

## 2018-06-18 MED ORDER — EPHEDRINE SULFATE 50 MG/ML IJ SOLN
INTRAMUSCULAR | Status: AC
  Filled 2018-06-18: qty 1

## 2018-06-18 MED ORDER — MIDAZOLAM HCL 2 MG/2ML IJ SOLN
INTRAMUSCULAR | Status: AC
  Filled 2018-06-18: qty 2

## 2018-06-18 MED ORDER — MIDAZOLAM HCL 2 MG/2ML IJ SOLN
INTRAMUSCULAR | Status: DC | PRN
  Administered 2018-06-18: 2 mg via INTRAVENOUS

## 2018-06-18 MED ORDER — FENTANYL CITRATE (PF) 100 MCG/2ML IJ SOLN
INTRAMUSCULAR | Status: DC | PRN
  Administered 2018-06-18: 25 ug via INTRAVENOUS
  Administered 2018-06-18: 50 ug via INTRAVENOUS
  Administered 2018-06-18: 25 ug via INTRAVENOUS

## 2018-06-18 MED ORDER — LIDOCAINE HCL (PF) 2 % IJ SOLN
INTRAMUSCULAR | Status: AC
  Filled 2018-06-18: qty 10

## 2018-06-18 MED ORDER — IOPAMIDOL (ISOVUE-300) INJECTION 61%
INTRAVENOUS | Status: DC | PRN
  Administered 2018-06-18: 40 mL via INTRAVENOUS

## 2018-06-18 MED ORDER — HYDROMORPHONE HCL 1 MG/ML IJ SOLN: 1.0000 mg | Freq: Once | INTRAMUSCULAR | Status: DC | PRN

## 2018-06-18 MED ORDER — HEPARIN SODIUM (PORCINE) 1000 UNIT/ML IJ SOLN
INTRAMUSCULAR | Status: DC | PRN
  Administered 2018-06-18: 4000 [IU] via INTRAVENOUS

## 2018-06-18 MED ORDER — PROPOFOL 500 MG/50ML IV EMUL
INTRAVENOUS | Status: AC
  Filled 2018-06-18: qty 50

## 2018-06-18 MED ORDER — FENTANYL CITRATE (PF) 100 MCG/2ML IJ SOLN: 25.0000 ug | INTRAMUSCULAR | Status: DC | PRN

## 2018-06-18 MED ORDER — FENTANYL CITRATE (PF) 100 MCG/2ML IJ SOLN
INTRAMUSCULAR | Status: AC
  Filled 2018-06-18: qty 2

## 2018-06-18 MED ORDER — ALTEPLASE 2 MG IJ SOLR
INTRAMUSCULAR | Status: DC | PRN
  Administered 2018-06-18: 4 mg

## 2018-06-18 MED ORDER — PHENYLEPHRINE HCL 10 MG/ML IJ SOLN
INTRAMUSCULAR | Status: DC | PRN
  Administered 2018-06-18 (×2): 50 ug via INTRAVENOUS

## 2018-06-18 MED ORDER — SODIUM CHLORIDE 0.9 % IV SOLN
INTRAVENOUS | Status: DC
  Administered 2018-06-18 (×2): via INTRAVENOUS

## 2018-06-18 MED ORDER — LIDOCAINE HCL URETHRAL/MUCOSAL 2 % EX GEL
CUTANEOUS | Status: AC
  Filled 2018-06-18: qty 5

## 2018-06-18 MED ORDER — ACETAMINOPHEN 325 MG PO TABS: 325.0000 mg | ORAL_TABLET | ORAL | Status: DC | PRN

## 2018-06-18 MED ORDER — PROPOFOL 500 MG/50ML IV EMUL
INTRAVENOUS | Status: DC | PRN
  Administered 2018-06-18: 25 ug/kg/min via INTRAVENOUS

## 2018-06-18 MED ORDER — ALTEPLASE 2 MG IJ SOLR
INTRAMUSCULAR | Status: AC
  Filled 2018-06-18: qty 4

## 2018-06-18 MED ORDER — ONDANSETRON HCL 4 MG/2ML IJ SOLN: 4.0000 mg | Freq: Four times a day (QID) | INTRAMUSCULAR | Status: DC | PRN

## 2018-06-18 SURGICAL SUPPLY — 23 items
BALLN DORADO 7X150X80 (BALLOONS) ×3
BALLN LUTONIX 7X80X130 (BALLOONS) ×3
BALLN LUTONIX DCB 5X100X130 (BALLOONS) ×3
BALLOON DORADO 7X150X80 (BALLOONS) IMPLANT
BALLOON LUTONIX 7X80X130 (BALLOONS) IMPLANT
BALLOON LUTONIX DCB 5X100X130 (BALLOONS) IMPLANT
CANNULA 5F STIFF (CANNULA) ×2 IMPLANT
CATH BEACON 5 .035 40 KMP TP (CATHETERS) IMPLANT
CATH BEACON 5 .038 40 KMP TP (CATHETERS) ×2
CATH EMBOLECTOMY 5FR (BALLOONS) ×2 IMPLANT
DEVICE PRESTO INFLATION (MISCELLANEOUS) ×2 IMPLANT
DRAPE BRACHIAL (DRAPES) ×2 IMPLANT
PACK ANGIOGRAPHY (CUSTOM PROCEDURE TRAY) ×3 IMPLANT
SET AVX THROMB ULT (MISCELLANEOUS) ×2 IMPLANT
SHEATH BRITE TIP 6FRX5.5 (SHEATH) ×4 IMPLANT
SHEATH BRITE TIP 7FRX5.5 (SHEATH) ×2 IMPLANT
STENT VIABAHN 8X150X120 (Permanent Stent) ×3 IMPLANT
STENT VIABAHN 8X15X120 7FR (Permanent Stent) IMPLANT
STENT VIABAHN 8X50X120 (Permanent Stent) ×2 IMPLANT
STENT VIABAHN5X120X8X (Permanent Stent) ×1 IMPLANT
SUT MNCRL AB 4-0 PS2 18 (SUTURE) ×2 IMPLANT
WIRE G 018X200 V18 (WIRE) ×2 IMPLANT
WIRE MAGIC TOR.035 180C (WIRE) ×4 IMPLANT

## 2018-06-18 NOTE — H&P (Signed)
[facility] Admission History & Physical  MRN : [id number]  Steven [name] is a 57 y.o. (August 07, 1961) male who presents with chief complaint of No chief complaint on file. Marland Kitchen  History of Present Illness: I am asked to evaluate the patient by the dialysis center. The patient was sent here because they were unable to cannulate the fistula this morning. Furthermore the Center states there is no thrill or bruit. The patient states this is the first dialysis run to be missed. This problem is acute in onset and has been present for approximately 2 days. The patient is unaware of any other change.  Patient denies pain or tenderness overlying the access.  There is no pain with dialysis.  The patient denies hand pain or finger pain consistent with steal syndrome.   There have been past interventions or declots of this access.  The patient is not chronically hypotensive on dialysis.  Current Facility-Administered Medications  Medication Dose Route Frequency Provider Last Rate Last Dose  . ceFAZolin (ANCEF) IVPB 1 g/50 mL premix  1 g Intravenous Once Kris Hartmann, NP        Past Medical History:  Diagnosis Date  . Anemia   . Cellulitis and abscess of right lower extremity 03/01/2017  . Chronic kidney disease    dialysis m,w,f  . Diabetes mellitus without complication ([place])   . Edema extremities 03/01/2017   bilateral swelling  . GERD (gastroesophageal reflux disease)   . High cholesterol   . High triglycerides   . Hypertension   . Neuropathy   . Sleep apnea    can't use cpap d/t feelings of suffocation    Past Surgical History:  Procedure Laterality Date  . A/V FISTULAGRAM Right 08/27/2017   Procedure: A/V FISTULAGRAM;  Surgeon: Katha Cabal, MD;  Location: [facility];  Service: Cardiovascular;  Laterality: Right;  . APPENDECTOMY  2010  . AV FISTULA PLACEMENT Right 06/14/2017   Procedure: ARTERIOVENOUS (AV) FISTULA CREATION WRIST;  Surgeon:  Katha Cabal, MD;  Location: [facility];  Service: Vascular;  Laterality: Right;  . DIALYSIS/PERMA CATHETER INSERTION N/A 03/05/2017   Procedure: Dialysis/Perma Catheter Insertion;  Surgeon: Katha Cabal, MD;  Location: [facility];  Service: Cardiovascular;  Laterality: N/A;  . DIALYSIS/PERMA CATHETER INSERTION N/A 09/20/2017   Procedure: DIALYSIS/PERMA CATHETER INSERTION;  Surgeon: Katha Cabal, MD;  Location: [place] [facility];  Service: Cardiovascular;  Laterality: N/A;  . EXCHANGE OF A DIALYSIS CATHETER  03/29/2017   Procedure: Exchange Of A Dialysis Catheter;  Surgeon: Katha Cabal, MD;  Location: [facility];  Service: Cardiovascular;;  . IRRIGATION AND DEBRIDEMENT FOOT Right 03/01/2017   Procedure: IRRIGATION AND DEBRIDEMENT FOOT;  Surgeon: Albertine Patricia, DPM;  Location: [facility];  Service: Podiatry;  Laterality: Right;  application of wound vac    Social History Social History   Tobacco Use  . Smoking status: Never Smoker  . Smokeless tobacco: Current User    Types: Snuff  Substance Use Topics  . 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 Drug use: No    Family History Family History  Problem Relation Age of Onset  . CAD Father     No family history of bleeding or clotting disorders, autoimmune disease or porphyria  Allergies  Allergen Reactions  . Codeine Nausea And Vomiting     REVIEW OF SYSTEMS (Negative unless checked)  Constitutional: [] Weight loss  [] Fever  [] Chills Cardiac: [] Chest pain   [] Chest pressure   [] Palpitations   [] Shortness of breath when laying flat   [] Shortness of breath at rest   [x] Shortness of breath with exertion. Vascular:  [] Pain in legs with walking   [] Pain in legs at rest   [] Pain in legs when laying flat   [] Claudication   [] Pain in feet when walking  [] Pain in feet at rest  [] Pain in feet when laying flat   [] History of DVT   [] Phlebitis   [] Swelling in legs    [] Varicose veins   [] Non-healing ulcers Pulmonary:   [] Uses home oxygen   [] Productive cough   [] Hemoptysis   [] Wheeze  [] COPD   [] Asthma Neurologic:  [] Dizziness  [] Blackouts   [] Seizures   [] History of stroke   [] History of TIA  [] Aphasia   [] Temporary blindness   [] Dysphagia   [] Weakness or numbness in arms   [] Weakness or numbness in legs Musculoskeletal:  [x] Arthritis   [] Joint swelling   [] Joint pain   [] Low back pain Hematologic:  [] Easy bruising  [] Easy bleeding   [] Hypercoagulable state   [] Anemic  [] Hepatitis Gastrointestinal:  [] Blood in stool   [] Vomiting blood  [] Gastroesophageal reflux/heartburn   [] Difficulty swallowing. Genitourinary:  [x] Chronic kidney disease   [] Difficult urination  [] Frequent urination  [] Burning with urination   [] Blood in urine Skin:  [] Rashes   [] Ulcers   [] Wounds Psychological:  [] History of anxiety   []  History of major depression.  Physical Examination  There were no vitals filed for this visit. There is no height or weight on file to calculate BMI. Gen: WD/WN, NAD Head: [place]/AT, No temporalis wasting. Prominent temp pulse not noted. Ear/Nose/Throat: Hearing grossly intact, nares w/o erythema or drainage, oropharynx w/o Erythema/Exudate,  Eyes: Conjunctiva clear, sclera non-icteric Neck: Trachea midline.  No JVD.  Pulmonary:  Good air movement, respirations not labored, no use of accessory muscles.  Cardiac: RRR, normal S1, S2. Vascular: no palpable thrill in AVF Vessel Right Left  Radial Palpable Palpable               Musculoskeletal: M/S 5/5 throughout.  Extremities without ischemic changes.  No deformity or atrophy.  Neurologic: Sensation grossly intact in extremities.  Symmetrical.  Speech is fluent. Motor exam as listed above. Psychiatric: Judgment intact, Mood & affect appropriate for pt's clinical situation. Dermatologic: No rashes or ulcers noted.  No cellulitis or open wounds.    CBC Lab Results  Component Value Date   WBC 5.6  09/19/2017   HGB 12.7 (L) 09/19/2017   HCT 37.4 (L) 09/19/2017   MCV 93.1 09/19/2017   PLT 181 09/19/2017    BMET    Component Value Date/Time   NA 134 (L) 09/20/2017 0344   K 4.3 09/20/2017 0344   CL 94 (L) 09/20/2017 0344   CO2 27 09/20/2017 0344   GLUCOSE 290 (H) 09/20/2017 0344   BUN 58 (H) 09/20/2017 0344   CREATININE 7.42 (H) 09/20/2017 0344   CREATININE 1.41 (H) 12/07/2011 1030   CALCIUM 8.3 (L) 09/20/2017 0344   GFRNONAA 7 (L) 09/20/2017 0344   GFRAA 9 (L) 09/20/2017 0344   CrCl cannot be calculated (Patient's most recent lab result is older than the maximum 21 days allowed.).  COAG Lab Results  Component Value Date   INR 1.07 05/23/2017    Radiology No results found.  Assessment/Plan 1.  Complication dialysis device with thrombosis AV access:  Patient's dialysis access is thrombosed. The  patient will undergo thrombectomy using interventional techniques.  The risks and benefits were described to the patient.  All questions were answered.  The patient agrees to proceed with angiography and intervention. Potassium will be drawn to ensure that it is an appropriate level prior to performing thrombectomy. 2.  End-stage renal disease requiring hemodialysis:  Patient will continue dialysis therapy without further interruption if a successful thrombectomy is not achieved then catheter will be placed. Dialysis has already been arranged since the patient missed their previous session 3.  Hypertension:  Patient will continue medical management; nephrology is following no changes in oral medications. 4.  Diabetes mellitus:  Glucose will be monitored and oral medications been held this morning once the patient has undergone the patient's procedure po intake will be reinitiated and again Accu-Cheks will be used to assess the blood glucose level and treat as needed. The patient will be restarted on the patient's usual hypoglycemic regime     Leotis Pain, MD  06/18/2018 8:52  AM

## 2018-06-18 NOTE — Anesthesia Post-op Follow-up Note (Signed)
Anesthesia QCDR form completed.        

## 2018-06-18 NOTE — Anesthesia Postprocedure Evaluation (Signed)
Anesthesia Post Note  Patient: Steven [name]  Procedure(s) Performed: PERIPHERAL VASCULAR THROMBECTOMY (Right )  Patient location during evaluation: PACU Anesthesia Type: General Level of consciousness: awake and alert Pain management: pain level controlled Vital Signs Assessment: post-procedure vital signs reviewed and stable Respiratory status: spontaneous breathing, nonlabored ventilation, respiratory function stable and patient connected to nasal cannula oxygen Cardiovascular status: blood pressure returned to baseline and stable Postop Assessment: no apparent nausea or vomiting Anesthetic complications: no     Last Vitals:  Vitals:   06/18/18 1140 06/18/18 1155  BP: 124/84 (!) 149/93  Pulse: 93   Resp: 14   Temp: 36.6 C   SpO2: 95% 97%    Last Pain:  Vitals:   06/18/18 1155  TempSrc:   PainSc: 0-No pain                 Alphonsus Sias

## 2018-06-18 NOTE — Anesthesia Preprocedure Evaluation (Addendum)
Anesthesia Evaluation  Patient identified by MRN, date of birth, ID band Patient awake    Reviewed: Allergy & Precautions, H&P , NPO status , reviewed documented beta blocker date and time   Airway Mallampati: III  TM Distance: >3 FB Neck ROM: full    Dental  (+) Poor Dentition, Chipped, Missing Chipped/cracked teeth - very poor dentition:   Pulmonary sleep apnea ,    Pulmonary exam normal        Cardiovascular hypertension, Normal cardiovascular exam  ECHO 02/2017 Study Conclusions  - Procedure narrative: Transthoracic echocardiography. Image   quality was poor. The study was technically difficult, as a   result of poor acoustic windows and poor sound wave transmission.   Intravenous contrast (Definity) was administered. - Left ventricle: The cavity size was normal. Systolic function was   normal. The estimated ejection fraction was in the range of 55%   to 65%. - Aortic valve: Bicuspid; mildly thickened leaflets. Valve area   (Vmax): 1.77 cm^2.   Neuro/Psych PSYCHIATRIC DISORDERS Depression    GI/Hepatic GERD  Medicated and Controlled,  Endo/Other  diabetes  Renal/GU Renal disease     Musculoskeletal   Abdominal   Peds  Hematology  (+) anemia ,   Anesthesia Other Findings Past Medical History: No date: Anemia 03/01/2017: Cellulitis and abscess of right lower extremity No date: Chronic kidney disease     Comment:  dialysis m,w,f No date: Diabetes mellitus without complication ([place]) 51/11/5850: Edema extremities     Comment:  bilateral swelling No date: GERD (gastroesophageal reflux disease) No date: High cholesterol No date: High triglycerides No date: Hypertension No date: Neuropathy No date: Sleep apnea     Comment:  can't use cpap d/t feelings of suffocation Past Surgical History: 08/27/2017: A/V FISTULAGRAM; Right     Comment:  Procedure: A/V FISTULAGRAM;  Surgeon: Katha Cabal, MD;  Location: [facility];  Service:               Cardiovascular;  Laterality: Right; 2010: APPENDECTOMY 06/14/2017: AV FISTULA PLACEMENT; Right     Comment:  Procedure: ARTERIOVENOUS (AV) FISTULA CREATION WRIST;                Surgeon: Katha Cabal, MD;  Location: [facility];                Service: Vascular;  Laterality: Right; 03/05/2017: DIALYSIS/PERMA CATHETER INSERTION; N/A     Comment:  Procedure: Dialysis/Perma Catheter Insertion;  Surgeon:               Katha Cabal, MD;  Location: [facility];               Service: Cardiovascular;  Laterality: N/A; 09/20/2017: DIALYSIS/PERMA CATHETER INSERTION; N/A     Comment:  Procedure: DIALYSIS/PERMA CATHETER INSERTION;  Surgeon:               Katha Cabal, MD;  Location: [facility];               Service: Cardiovascular;  Laterality: N/A; 03/29/2017: EXCHANGE OF A DIALYSIS CATHETER     Comment:  Procedure: Exchange Of A Dialysis Catheter;  Surgeon:               Katha Cabal, MD;  Location: [facility];  Service: Cardiovascular;; 03/01/2017: IRRIGATION AND DEBRIDEMENT FOOT; Right     Comment:  Procedure: IRRIGATION AND DEBRIDEMENT FOOT;  Surgeon:               Albertine Patricia, DPM;  Location: [facility];  Service:               Podiatry;  Laterality: Right;  application of wound vac   Reproductive/Obstetrics                           Anesthesia Physical Anesthesia Plan  ASA: III  Anesthesia Plan: General   Post-op Pain Management:    Induction: Intravenous  PONV Risk Score and Plan: Ondansetron and Treatment may vary due to age or medical condition  Airway Management Planned: LMA  Additional Equipment:   Intra-op Plan:   Post-operative Plan: Extubation in OR  Informed Consent: I have reviewed the patients History and Physical, chart, labs and discussed the procedure including the risks, benefits and alternatives for the proposed  anesthesia with the patient or authorized representative who has indicated his/her understanding and acceptance.   Dental Advisory Given  Plan Discussed with: CRNA  Anesthesia Plan Comments:         Anesthesia Quick Evaluation

## 2018-06-18 NOTE — Transfer of Care (Addendum)
Immediate Anesthesia Transfer of Care Note  Patient: Steven [name]  Procedure(s) Performed: PERIPHERAL VASCULAR THROMBECTOMY (Right )  Patient Location: PACU  Anesthesia Type:General  Level of Consciousness: awake and oriented  Airway & Oxygen Therapy: Patient Spontanous Breathing and Patient connected to face mask oxygen  Post-op Assessment: Report given to RN and Post -op Vital signs reviewed and stable  Post vital signs: Reviewed and stable  Last Vitals:  Vitals Value Taken Time  BP    Temp    Pulse    Resp    SpO2      Last Pain:  Vitals:   06/18/18 0911  TempSrc: Oral  PainSc: 0-No pain         Complications: No apparent anesthesia complications

## 2018-06-18 NOTE — Anesthesia Procedure Notes (Signed)
Procedure Name: LMA Insertion Date/Time: 06/18/2018 9:53 AM Performed by: Allean Found, CRNA Pre-anesthesia Checklist: Patient identified, Patient being monitored, Timeout performed, Emergency Drugs available and Suction available Patient Re-evaluated:Patient Re-evaluated prior to induction Oxygen Delivery Method: Circle system utilized Preoxygenation: Pre-oxygenation with 100% oxygen Induction Type: IV induction Ventilation: Mask ventilation without difficulty LMA: LMA inserted LMA Size: 5.0 Tube type: Oral Number of attempts: 1 Placement Confirmation: positive ETCO2 and breath sounds checked- equal and bilateral Tube secured with: Tape Dental Injury: Teeth and Oropharynx as per pre-operative assessment

## 2018-06-18 NOTE — Op Note (Signed)
[facility]    OPERATIVE NOTE   PROCEDURE: 1.  Right radiocephalic arteriovenous fistula cannulation under ultrasound guidance in both a retrograde and then antegrade fashion crossing 2.  Right arm fistulagram and central venogram 3.  Catheter directed thrombolysis with 4 mg of TPA delivered with the AngioJet AVX catheter 4.  Mechanical rheolytic thrombectomy to the right radiocephalic AV fistula with the AngioJet AVX catheter 5.  Fogarty embolectomy for residual arterial plug 6.  Percutaneous transluminal angioplasty of arterial anastomosis with 5 mm diameter by 10 cm length Lutonix drug-coated angioplasty balloon 7.  Percutaneous transluminal angioplasty of the forearm cephalic vein with 7 mm centimeter length Lutonix drug-coated angioplasty balloon inflated twice 8.  Viabahn stent placement x2 to the forearm cephalic vein for residual stenosis and thrombosis after angioplasty using a 6 mm diameter by 15 cm length and a 6 mm diameter by 5 cm length covered stent  PRE-OPERATIVE DIAGNOSIS: 1. ESRD 2.  Thrombosed right radiocephalic arteriovenous fistula  POST-OPERATIVE DIAGNOSIS: same as above   SURGEON: Leotis Pain, MD  ANESTHESIA:  general  ESTIMATED BLOOD LOSS: 10 cc   FINDING(S): 1. Thrombosed right radiocephalic AV fistula with dual outflow in the upper arm and no central venous stenoses  SPECIMEN(S):  None  CONTRAST: 40 cc  FLUORO TIME: 5.8 minutes  INDICATIONS: Patient is a 57 y.o.male who presents with a thrombosed right radiocephalic arteriovenous fistula.  The patient is scheduled for an attempted declot and shuntogram.  The patient is aware the risks include but are not limited to: bleeding, infection, thrombosis of the cannulated access, and possible anaphylactic reaction to the contrast.  The patient is aware of the risks of the procedure and elects to proceed forward.  DESCRIPTION: After full informed written consent was obtained, the  patient was brought back to the angiography suite and placed supine upon the angiography table.  The patient was connected to monitoring equipment. Moderate conscious sedation was administered during a face to face encounter with the patient throughout the procedure with my supervision of the RN administering medicines and monitoring the patient's vital signs, pulse oximetry, telemetry and mental status throughout from the start of the procedure until the patient was taken to the recovery room. The right arm was prepped and draped in the standard fashion for a percutaneous access intervention.  Under ultrasound guidance, the right arteriovenous fistula was cannulated with a micropuncture needle under direct ultrasound guidance due to the pulseless nature of the fistula in both an antegrade and a retrograde fashion crossing, and permanent images were performed.  The microwire was advanced and the needle was exchanged for the a microsheath.  I then upsized to a 6 Fr Sheath and imaging was performed.  Hand injections were completed to image the access including the central venous system. This demonstrated thrombosis of the AVF.  Based on the images, this patient will need extensive treatment to salvage the fistula.  I then gave the patient 4000 units of intravenous heparin.  I then placed a Magic torque wire into the distal radial artery from the retrograde sheath and into the upper arm basilic vein from the antegrade sheath. 4 mg of TPA were deployed throughout the entirety of the fistula and into the cephalic vein at the antecubital fossa. This was allowed to dwell. Mechanical rheolytic thrombectomy was then performed throughout the fistula and into the antecubital cephalic vein. This uncovered multiple areas of strictures within the forearm cephalic vein.  A residual arterial plug was also  seen at the arterial anastomosis. An attempt to clear the arterial plug was done with 3 passes of the Fogarty embolectomy  balloon. Flow-limiting arterial plug remained, and I elected to treat this lesion with a 5 mm diameter by 10 cm length Lutonix drug-coated angioplasty balloon. This resulted in resolution of the arterial plug, and clearance of the arterial side of the graft. The arterial outflow was seen to be intact through the radial artery as well on these images. The retrograde sheath was removed. I then turned my attention to the thrombus in the fistula and the forearm cephalic vein. Mechanical rheolytic thrombectomy was performed. This resulted in minimal improvement.  I then elected to treat this with angioplasty.  2 inflations with a 7 mm diameter by 8 cm length Lutonix drug-coated angioplasty balloon inflated up to 10 to 12 atm for 1 minute. There remained multiple areas of >50% residual stenosis and thrombus.  I then upsized to a 7 Fr sheath and exchanged for a 0.018 wire.  A 8 mm diameter by 15 cm length Viabahn stent was then deployed from the cephalic vein just below the antecubital fossa before it split into the basilic and the cephalic veins in the upper arm.  An additional 8 mm diameter by 5 cm length Viabahn stent was then deployed back to the mid to distal upper arm cephalic vein.  These were postdilated with a 7 mm diameter high-pressure angioplasty balloon with excellent angiographic completion result and less than 10% residual stenosis.  There was now brisk flow in the access.    Based on the completion imaging, no further intervention is necessary.  The wire and balloon were removed from the sheath.  A 4-0 Monocryl purse-string suture was sewn around the sheath.  The sheath was removed while tying down the suture.  A sterile bandage was applied to the puncture site.  COMPLICATIONS: None  CONDITION: Stable   Leotis Pain 06/18/2018 11:04 AM   This note was created with Dragon Medical transcription system. Any errors in dictation are purely unintentional.

## 2018-06-18 NOTE — Progress Notes (Signed)
Pt Sister Blanch Media called and d/c instructions provided over the phone. Per Blanch Media she is unable to ambulate well and would prefer to pick up Pt at door. Patient also provided d/c instructions and transferred to exit via W/C.

## 2018-06-18 NOTE — Anesthesia Post-op Follow-up Note (Deleted)
Anesthesia QCDR form completed.        

## 2018-06-19 ENCOUNTER — Other Ambulatory Visit: Payer: Self-pay

## 2018-06-19 ENCOUNTER — Inpatient Hospital Stay
Admission: EM | Admit: 2018-06-19 | Discharge: 2018-06-24 | DRG: 252 | Disposition: A | Discharge status: Home / Self Care | Payer: Medicare Other | Attending: Family Medicine | Admitting: Family Medicine

## 2018-06-19 ENCOUNTER — Encounter: Payer: Self-pay | Admitting: Vascular Surgery

## 2018-06-19 DIAGNOSIS — E871 Hypo-osmolality and hyponatremia: Secondary | ICD-10-CM | POA: Diagnosis present

## 2018-06-19 DIAGNOSIS — Z6838 Body mass index (BMI) 38.0-38.9, adult: Secondary | ICD-10-CM

## 2018-06-19 DIAGNOSIS — E785 Hyperlipidemia, unspecified: Secondary | ICD-10-CM | POA: Diagnosis present

## 2018-06-19 DIAGNOSIS — Z720 Tobacco use: Secondary | ICD-10-CM | POA: Diagnosis not present

## 2018-06-19 DIAGNOSIS — N186 End stage renal disease: Secondary | ICD-10-CM | POA: Diagnosis present

## 2018-06-19 DIAGNOSIS — T82510A Breakdown (mechanical) of surgically created arteriovenous fistula, initial encounter: Secondary | ICD-10-CM | POA: Diagnosis present

## 2018-06-19 DIAGNOSIS — E0810 Diabetes mellitus due to underlying condition with ketoacidosis without coma: Secondary | ICD-10-CM

## 2018-06-19 DIAGNOSIS — T82868A Thrombosis of vascular prosthetic devices, implants and grafts, initial encounter: Principal | ICD-10-CM | POA: Diagnosis present

## 2018-06-19 DIAGNOSIS — I959 Hypotension, unspecified: Secondary | ICD-10-CM | POA: Diagnosis not present

## 2018-06-19 DIAGNOSIS — Z79899 Other long term (current) drug therapy: Secondary | ICD-10-CM

## 2018-06-19 DIAGNOSIS — I120 Hypertensive chronic kidney disease with stage 5 chronic kidney disease or end stage renal disease: Secondary | ICD-10-CM | POA: Diagnosis not present

## 2018-06-19 DIAGNOSIS — Z7951 Long term (current) use of inhaled steroids: Secondary | ICD-10-CM

## 2018-06-19 DIAGNOSIS — N2581 Secondary hyperparathyroidism of renal origin: Secondary | ICD-10-CM | POA: Diagnosis not present

## 2018-06-19 DIAGNOSIS — E1110 Type 2 diabetes mellitus with ketoacidosis without coma: Secondary | ICD-10-CM | POA: Diagnosis not present

## 2018-06-19 DIAGNOSIS — Z885 Allergy status to narcotic agent status: Secondary | ICD-10-CM | POA: Diagnosis not present

## 2018-06-19 DIAGNOSIS — D631 Anemia in chronic kidney disease: Secondary | ICD-10-CM | POA: Diagnosis present

## 2018-06-19 DIAGNOSIS — Y838 Other surgical procedures as the cause of abnormal reaction of the patient, or of later complication, without mention of misadventure at the time of the procedure: Secondary | ICD-10-CM | POA: Diagnosis not present

## 2018-06-19 DIAGNOSIS — Z794 Long term (current) use of insulin: Secondary | ICD-10-CM | POA: Diagnosis not present

## 2018-06-19 DIAGNOSIS — Z23 Encounter for immunization: Secondary | ICD-10-CM

## 2018-06-19 DIAGNOSIS — G4733 Obstructive sleep apnea (adult) (pediatric): Secondary | ICD-10-CM | POA: Diagnosis present

## 2018-06-19 DIAGNOSIS — Y712 Prosthetic and other implants, materials and accessory cardiovascular devices associated with adverse incidents: Secondary | ICD-10-CM | POA: Diagnosis not present

## 2018-06-19 DIAGNOSIS — F1020 Alcohol dependence, uncomplicated: Secondary | ICD-10-CM | POA: Diagnosis present

## 2018-06-19 DIAGNOSIS — T82898A Other specified complication of vascular prosthetic devices, implants and grafts, initial encounter: Secondary | ICD-10-CM | POA: Diagnosis not present

## 2018-06-19 DIAGNOSIS — Z992 Dependence on renal dialysis: Secondary | ICD-10-CM

## 2018-06-19 DIAGNOSIS — K219 Gastro-esophageal reflux disease without esophagitis: Secondary | ICD-10-CM | POA: Diagnosis present

## 2018-06-19 DIAGNOSIS — R000 Tachycardia, unspecified: Secondary | ICD-10-CM | POA: Diagnosis not present

## 2018-06-19 DIAGNOSIS — I10 Essential (primary) hypertension: Secondary | ICD-10-CM | POA: Diagnosis not present

## 2018-06-19 DIAGNOSIS — E7800 Pure hypercholesterolemia, unspecified: Secondary | ICD-10-CM | POA: Diagnosis not present

## 2018-06-19 DIAGNOSIS — R42 Dizziness and giddiness: Secondary | ICD-10-CM | POA: Diagnosis not present

## 2018-06-19 DIAGNOSIS — Z7982 Long term (current) use of aspirin: Secondary | ICD-10-CM | POA: Diagnosis not present

## 2018-06-19 DIAGNOSIS — E1122 Type 2 diabetes mellitus with diabetic chronic kidney disease: Secondary | ICD-10-CM | POA: Diagnosis not present

## 2018-06-19 DIAGNOSIS — Z789 Other specified health status: Secondary | ICD-10-CM

## 2018-06-19 LAB — GLUCOSE, CAPILLARY
GLUCOSE-CAPILLARY: 207 mg/dL — AB (ref 70–99)
GLUCOSE-CAPILLARY: 186 mg/dL — AB (ref 70–99)
GLUCOSE-CAPILLARY: 197 mg/dL — AB (ref 70–99)
Glucose-Capillary: >600 mg/dL (ref 70–99)
Glucose-Capillary: >600 mg/dL (ref 70–99)
Glucose-Capillary: 436 mg/dL — ABNORMAL HIGH (ref 70–99)
Glucose-Capillary: 332 mg/dL — ABNORMAL HIGH (ref 70–99)
Glucose-Capillary: 260 mg/dL — ABNORMAL HIGH (ref 70–99)
Glucose-Capillary: 188 mg/dL — ABNORMAL HIGH (ref 70–99)

## 2018-06-19 LAB — BASIC METABOLIC PANEL
ANION GAP: 18 — AB (ref 5–15)
ANION GAP: 16 — AB (ref 5–15)
ANION GAP: 15 (ref 5–15)
BUN: 67 mg/dL — ABNORMAL HIGH (ref 6–20)
BUN: 63 mg/dL — ABNORMAL HIGH (ref 6–20)
BUN: 64 mg/dL — ABNORMAL HIGH (ref 6–20)
CHLORIDE: 85 mmol/L — AB (ref 98–111)
CHLORIDE: 90 mmol/L — AB (ref 98–111)
CO2: 22 mmol/L (ref 22–32)
CO2: 24 mmol/L (ref 22–32)
CO2: 26 mmol/L (ref 22–32)
Calcium: 8.2 mg/dL — ABNORMAL LOW (ref 8.9–10.3)
Calcium: 8.4 mg/dL — ABNORMAL LOW (ref 8.9–10.3)
Calcium: 8.6 mg/dL — ABNORMAL LOW (ref 8.9–10.3)
Chloride: 87 mmol/L — ABNORMAL LOW (ref 98–111)
Creatinine, Ser: 8.8 mg/dL — ABNORMAL HIGH (ref 0.61–1.24)
Creatinine, Ser: 8.83 mg/dL — ABNORMAL HIGH (ref 0.61–1.24)
Creatinine, Ser: 8.79 mg/dL — ABNORMAL HIGH (ref 0.61–1.24)
GFR calc non Af Amer: 6 mL/min — ABNORMAL LOW (ref >60)
GFR calc non Af Amer: 6 mL/min — ABNORMAL LOW (ref >60)
GFR calc non Af Amer: 6 mL/min — ABNORMAL LOW (ref >60)
GFR, EST AFRICAN AMERICAN: 7 mL/min — AB (ref >60)
GFR, EST AFRICAN AMERICAN: 7 mL/min — AB (ref >60)
GFR, EST AFRICAN AMERICAN: 7 mL/min — AB (ref >60)
GLUCOSE: 547 mg/dL — AB (ref 70–99)
Glucose, Bld: 701 mg/dL (ref 70–99)
Glucose, Bld: 232 mg/dL — ABNORMAL HIGH (ref 70–99)
POTASSIUM: 4.4 mmol/L (ref 3.5–5.1)
Potassium: 3.9 mmol/L (ref 3.5–5.1)
Potassium: 3.6 mmol/L (ref 3.5–5.1)
Sodium: 125 mmol/L — ABNORMAL LOW (ref 135–145)
Sodium: 127 mmol/L — ABNORMAL LOW (ref 135–145)
Sodium: 131 mmol/L — ABNORMAL LOW (ref 135–145)

## 2018-06-19 LAB — CBC
HCT: 30 % — ABNORMAL LOW (ref 40.0–52.0)
Hemoglobin: 10 g/dL — ABNORMAL LOW (ref 13.0–18.0)
MCH: 32.7 pg (ref 26.0–34.0)
MCHC: 33.4 g/dL (ref 32.0–36.0)
MCV: 98 fL (ref 80.0–100.0)
PLATELETS: 100 10*3/uL — AB (ref 150–440)
RBC: 3.06 MIL/uL — ABNORMAL LOW (ref 4.40–5.90)
RDW: 14.8 % — AB (ref 11.5–14.5)
WBC: 3.5 10*3/uL — AB (ref 3.8–10.6)

## 2018-06-19 LAB — CBC WITH DIFFERENTIAL/PLATELET
Basophils Absolute: 0 10*3/uL (ref 0–0.1)
Basophils Relative: 1 %
Eosinophils Absolute: 0 10*3/uL (ref 0–0.7)
Eosinophils Relative: 1 %
HEMATOCRIT: 30.7 % — AB (ref 40.0–52.0)
HEMOGLOBIN: 10.2 g/dL — AB (ref 13.0–18.0)
LYMPHS ABS: 0.4 10*3/uL — AB (ref 1.0–3.6)
Lymphocytes Relative: 9 %
MCH: 32.7 pg (ref 26.0–34.0)
MCHC: 33.3 g/dL (ref 32.0–36.0)
MCV: 98 fL (ref 80.0–100.0)
Monocytes Absolute: 0.3 10*3/uL (ref 0.2–1.0)
Monocytes Relative: 8 %
NEUTROS ABS: 3.2 10*3/uL (ref 1.4–6.5)
NEUTROS PCT: 81 %
Platelets: 102 10*3/uL — ABNORMAL LOW (ref 150–440)
RBC: 3.13 MIL/uL — AB (ref 4.40–5.90)
RDW: 14.6 % — ABNORMAL HIGH (ref 11.5–14.5)
WBC: 3.9 10*3/uL (ref 3.8–10.6)

## 2018-06-19 LAB — URINALYSIS, COMPLETE (UACMP) WITH MICROSCOPIC
Bacteria, UA: NONE SEEN
Bilirubin Urine: NEGATIVE
Ketones, ur: NEGATIVE mg/dL
Leukocytes, UA: NEGATIVE
Nitrite: NEGATIVE
PROTEIN: 100 mg/dL — AB
SPECIFIC GRAVITY, URINE: 1.008 (ref 1.005–1.030)
SQUAMOUS EPITHELIAL / LPF: NONE SEEN (ref 0–5)
pH: 7 (ref 5.0–8.0)

## 2018-06-19 LAB — PHOSPHORUS
PHOSPHORUS: 2.2 mg/dL — AB (ref 2.5–4.6)
Phosphorus: 1.7 mg/dL — ABNORMAL LOW (ref 2.5–4.6)

## 2018-06-19 LAB — BETA-HYDROXYBUTYRIC ACID: BETA-HYDROXYBUTYRIC ACID: 0.62 mmol/L — AB (ref 0.05–0.27)

## 2018-06-19 LAB — MAGNESIUM: Magnesium: 1.6 mg/dL — ABNORMAL LOW (ref 1.7–2.4)

## 2018-06-19 LAB — MRSA PCR SCREENING: MRSA by PCR: NEGATIVE

## 2018-06-19 MED ORDER — LORAZEPAM 1 MG PO TABS: 1.0000 mg | ORAL_TABLET | Freq: Four times a day (QID) | ORAL | Status: DC | PRN

## 2018-06-19 MED ORDER — LORAZEPAM 2 MG PO TABS: 0.0000 mg | ORAL_TABLET | Freq: Two times a day (BID) | ORAL | Status: DC

## 2018-06-19 MED ORDER — MAGNESIUM SULFATE 2 GM/50ML IV SOLN
2.0000 g | Freq: Once | INTRAVENOUS | Status: AC
  Administered 2018-06-19: 2 g via INTRAVENOUS
  Filled 2018-06-19: qty 50

## 2018-06-19 MED ORDER — LOSARTAN POTASSIUM 25 MG PO TABS
25.0000 mg | ORAL_TABLET | Freq: Every day | ORAL | Status: DC
  Administered 2018-06-22: 25 mg via ORAL
  Filled 2018-06-19 (×2): qty 1

## 2018-06-19 MED ORDER — VITAMIN B-1 100 MG PO TABS
100.0000 mg | ORAL_TABLET | Freq: Every day | ORAL | Status: DC
  Administered 2018-06-19 – 2018-06-21 (×3): 100 mg via ORAL
  Filled 2018-06-19 (×3): qty 1

## 2018-06-19 MED ORDER — SODIUM CHLORIDE 0.9 % IV SOLN: INTRAVENOUS | Status: DC

## 2018-06-19 MED ORDER — SODIUM CHLORIDE 0.9 % IV SOLN: 100.0000 mL | INTRAVENOUS | Status: DC | PRN

## 2018-06-19 MED ORDER — ALTEPLASE 2 MG IJ SOLR: 2.0000 mg | Freq: Once | INTRAMUSCULAR | Status: DC | PRN

## 2018-06-19 MED ORDER — ENOXAPARIN SODIUM 40 MG/0.4ML ~~LOC~~ SOLN: 40.0000 mg | SUBCUTANEOUS | Status: DC

## 2018-06-19 MED ORDER — DEXTROSE-NACL 5-0.45 % IV SOLN
INTRAVENOUS | Status: DC
  Administered 2018-06-19: 21:00:00 via INTRAVENOUS

## 2018-06-19 MED ORDER — HEPARIN SODIUM (PORCINE) 1000 UNIT/ML DIALYSIS
1000.0000 [IU] | INTRAMUSCULAR | Status: DC | PRN
  Filled 2018-06-19: qty 1

## 2018-06-19 MED ORDER — GABAPENTIN 300 MG PO CAPS
300.0000 mg | ORAL_CAPSULE | Freq: Two times a day (BID) | ORAL | Status: DC
  Administered 2018-06-19 – 2018-06-24 (×8): 300 mg via ORAL
  Filled 2018-06-19 (×9): qty 1

## 2018-06-19 MED ORDER — DEXTROSE-NACL 5-0.45 % IV SOLN: INTRAVENOUS | Status: DC

## 2018-06-19 MED ORDER — LORAZEPAM 2 MG/ML IJ SOLN: 1.0000 mg | Freq: Four times a day (QID) | INTRAMUSCULAR | Status: DC | PRN

## 2018-06-19 MED ORDER — ASPIRIN EC 81 MG PO TBEC
81.0000 mg | DELAYED_RELEASE_TABLET | Freq: Every day | ORAL | Status: DC
  Administered 2018-06-19 – 2018-06-24 (×5): 81 mg via ORAL
  Filled 2018-06-19 (×5): qty 1

## 2018-06-19 MED ORDER — LIDOCAINE HCL (PF) 1 % IJ SOLN
5.0000 mL | INTRAMUSCULAR | Status: DC | PRN
  Filled 2018-06-19: qty 5

## 2018-06-19 MED ORDER — AMITRIPTYLINE HCL 25 MG PO TABS
25.0000 mg | ORAL_TABLET | Freq: Every day | ORAL | Status: DC
  Administered 2018-06-19 – 2018-06-23 (×5): 25 mg via ORAL
  Filled 2018-06-19 (×6): qty 1

## 2018-06-19 MED ORDER — INFLUENZA VAC SPLIT QUAD 0.5 ML IM SUSY
0.5000 mL | PREFILLED_SYRINGE | INTRAMUSCULAR | Status: AC
  Administered 2018-06-21: 0.5 mL via INTRAMUSCULAR
  Filled 2018-06-19: qty 0.5

## 2018-06-19 MED ORDER — HEPARIN SODIUM (PORCINE) 5000 UNIT/ML IJ SOLN
5000.0000 [IU] | Freq: Three times a day (TID) | INTRAMUSCULAR | Status: DC
  Administered 2018-06-19 – 2018-06-24 (×14): 5000 [IU] via SUBCUTANEOUS
  Filled 2018-06-19 (×14): qty 1

## 2018-06-19 MED ORDER — PANTOPRAZOLE SODIUM 40 MG PO TBEC
40.0000 mg | DELAYED_RELEASE_TABLET | ORAL | Status: DC
  Administered 2018-06-19 – 2018-06-21 (×2): 40 mg via ORAL
  Filled 2018-06-19 (×2): qty 1

## 2018-06-19 MED ORDER — SODIUM CHLORIDE 0.9 % IV SOLN
INTRAVENOUS | Status: DC
  Administered 2018-06-19: 5.4 [IU]/h via INTRAVENOUS
  Filled 2018-06-19: qty 1

## 2018-06-19 MED ORDER — CHLORHEXIDINE GLUCONATE CLOTH 2 % EX PADS
6.0000 | MEDICATED_PAD | Freq: Every day | CUTANEOUS | Status: DC
  Administered 2018-06-20 – 2018-06-24 (×4): 6 via TOPICAL

## 2018-06-19 MED ORDER — ADULT MULTIVITAMIN W/MINERALS CH
1.0000 | ORAL_TABLET | Freq: Every day | ORAL | Status: DC
  Administered 2018-06-20 – 2018-06-21 (×2): 1 via ORAL
  Filled 2018-06-19 (×2): qty 1

## 2018-06-19 MED ORDER — OMEPRAZOLE MAGNESIUM 20 MG PO TBEC: 20.0000 mg | DELAYED_RELEASE_TABLET | ORAL | Status: DC

## 2018-06-19 MED ORDER — LORAZEPAM 2 MG PO TABS
0.0000 mg | ORAL_TABLET | Freq: Four times a day (QID) | ORAL | Status: DC
  Administered 2018-06-19: 2 mg via ORAL
  Filled 2018-06-19: qty 1

## 2018-06-19 MED ORDER — LIDOCAINE-PRILOCAINE 2.5-2.5 % EX CREA
1.0000 "application " | TOPICAL_CREAM | CUTANEOUS | Status: DC | PRN
  Filled 2018-06-19: qty 5

## 2018-06-19 MED ORDER — SODIUM CHLORIDE 0.9 % IV SOLN
INTRAVENOUS | Status: DC
  Filled 2018-06-19: qty 1

## 2018-06-19 MED ORDER — FOLIC ACID 1 MG PO TABS
1.0000 mg | ORAL_TABLET | Freq: Every day | ORAL | Status: DC
  Administered 2018-06-19 – 2018-06-21 (×3): 1 mg via ORAL
  Filled 2018-06-19 (×3): qty 1

## 2018-06-19 MED ORDER — METOPROLOL TARTRATE 25 MG PO TABS
25.0000 mg | ORAL_TABLET | Freq: Two times a day (BID) | ORAL | Status: DC
  Administered 2018-06-20 – 2018-06-22 (×5): 25 mg via ORAL
  Filled 2018-06-19 (×5): qty 1

## 2018-06-19 MED ORDER — PENTAFLUOROPROP-TETRAFLUOROETH EX AERO
1.0000 "application " | INHALATION_SPRAY | CUTANEOUS | Status: DC | PRN
  Filled 2018-06-19: qty 30

## 2018-06-19 MED ORDER — NOREPINEPHRINE 4 MG/250ML-% IV SOLN
0.0000 ug/min | INTRAVENOUS | Status: DC
  Administered 2018-06-19: 5 ug/min via INTRAVENOUS
  Filled 2018-06-19: qty 250

## 2018-06-19 MED ORDER — ADULT MULTIVITAMIN W/MINERALS CH: 1.0000 | ORAL_TABLET | Freq: Every day | ORAL | Status: DC

## 2018-06-19 MED ORDER — THIAMINE HCL 100 MG/ML IJ SOLN: 100.0000 mg | Freq: Every day | INTRAMUSCULAR | Status: DC

## 2018-06-19 NOTE — H&P (Addendum)
[facility] [place] NAME: Steven [name]    MR#:  [id number]  DATE OF BIRTH:  [birth year]  DATE OF ADMISSION:  06/19/2018  PRIMARY CARE PHYSICIAN: [name], MD   REQUESTING/REFERRING PHYSICIAN: dr Reita Cliche  CHIEF COMPLAINT:  Complication of dialysis access and elevated blood sugars HISTORY OF PRESENT ILLNESS:  Steven [name]  is a 57 y.o. male with a known history of stage renal disease requiring hemodialysis and uncontrolled diabetes who presents to the emergency room due to complication of his dialysis device which has thrombosed.  Patient was noted to have blood sugars in the 700s and anion gap.  He will be admitted to stepdown for DKA and nephrology consultation vascular start consultation have been placed. Vascular saw the patient yesterday and underwent angiography and intervention however today when he went to dialysis they  access was not working, so he presents to the ER for further evaluation.   Patient started on insulin drip and IV fluids.  PAST MEDICAL HISTORY:   Past Medical History:  Diagnosis Date  . Anemia   . Cellulitis and abscess of right lower extremity 03/01/2017  . Chronic kidney disease    dialysis m,w,f  . Diabetes mellitus without complication ([place])   . Edema extremities 03/01/2017   bilateral swelling  . GERD (gastroesophageal reflux disease)   . High cholesterol   . High triglycerides   . Hypertension   . Neuropathy   . Sleep apnea    can't use cpap d/t feelings of suffocation    PAST SURGICAL HISTORY:   Past Surgical History:  Procedure Laterality Date  . A/V FISTULAGRAM Right 08/27/2017   Procedure: A/V FISTULAGRAM;  Surgeon: Katha Cabal, MD;  Location: [facility];  Service: Cardiovascular;  Laterality: Right;  . APPENDECTOMY  2010  . AV FISTULA PLACEMENT Right 06/14/2017   Procedure: ARTERIOVENOUS (AV) FISTULA CREATION WRIST;  Surgeon: Katha Cabal, MD;  Location: [facility];   Service: Vascular;  Laterality: Right;  . DIALYSIS/PERMA CATHETER INSERTION N/A 03/05/2017   Procedure: Dialysis/Perma Catheter Insertion;  Surgeon: Katha Cabal, MD;  Location: [facility];  Service: Cardiovascular;  Laterality: N/A;  . DIALYSIS/PERMA CATHETER INSERTION N/A 09/20/2017   Procedure: DIALYSIS/PERMA CATHETER INSERTION;  Surgeon: Katha Cabal, MD;  Location: [facility];  Service: Cardiovascular;  Laterality: N/A;  . EXCHANGE OF A DIALYSIS CATHETER  03/29/2017   Procedure: Exchange Of A Dialysis Catheter;  Surgeon: Katha Cabal, MD;  Location: [facility];  Service: Cardiovascular;;  . IRRIGATION AND DEBRIDEMENT FOOT Right 03/01/2017   Procedure: IRRIGATION AND DEBRIDEMENT FOOT;  Surgeon: Albertine Patricia, DPM;  Location: [facility];  Service: Podiatry;  Laterality: Right;  application of wound vac  . PERIPHERAL VASCULAR THROMBECTOMY Right 06/18/2018   Procedure: PERIPHERAL VASCULAR THROMBECTOMY;  Surgeon: Algernon Huxley, MD;  Location: [facility];  Service: Cardiovascular;  Laterality: Right;    SOCIAL HISTORY:   Social History   Tobacco Use  . Smoking status: Never Smoker  . Smokeless tobacco: Current User    Types: Snuff  Substance Use Topics  . Alcohol use: Yes    Comment: up until 01/2017 was heavy drinker...4 40 oz qd    FAMILY HISTORY:   Family History  Problem Relation Age of Onset  . CAD Father     DRUG ALLERGIES:   Allergies  Allergen Reactions  . Codeine Nausea And Vomiting    REVIEW  OF SYSTEMS:   Review of Systems  Constitutional: Negative.  Negative for chills, fever and malaise/fatigue.  HENT: Negative.  Negative for ear discharge, ear pain, hearing loss, nosebleeds and sore throat.   Eyes: Negative.  Negative for blurred vision and pain.  Respiratory: Positive for shortness of breath. Negative for cough, hemoptysis and wheezing.   Cardiovascular: Positive for leg swelling. Negative for chest pain  and palpitations.  Gastrointestinal: Negative.  Negative for abdominal pain, blood in stool, diarrhea, nausea and vomiting.  Genitourinary: Positive for frequency. Negative for dysuria.  Musculoskeletal: Negative.  Negative for back pain.  Skin: Negative.   Neurological: Negative for dizziness, tremors, speech change, focal weakness, seizures and headaches.  Endo/Heme/Allergies: Negative.  Does not bruise/bleed easily.  Psychiatric/Behavioral: Negative.  Negative for depression, hallucinations and suicidal ideas.    MEDICATIONS AT HOME:   Prior to Admission medications   Medication Sig Start Date End Date Taking? Authorizing Provider  acarbose (PRECOSE) 100 MG tablet Take 100 mg 3 (three) times daily with meals by mouth.    [provider]  amitriptyline (ELAVIL) 25 MG tablet Take 25 mg by mouth at bedtime.    [provider]  aspirin EC 81 MG tablet TAKE ONE  BY MOUTH EVERY DAY 09/15/12   Alveda Reasons, MD  furosemide (LASIX) 80 MG tablet Take 1 tablet (80 mg total) by mouth daily. Patient taking differently: Take 160 mg by mouth 2 (two) times daily.  03/08/17   Vaughan Basta, MD  gabapentin (NEURONTIN) 300 MG capsule Take 1 capsule (300 mg total) by mouth 2 (two) times daily. 03/07/17   Vaughan Basta, MD  glucose blood (ACCU-CHEK AVIVA) test strip Use as instructed.use to check blood sugar up to 3 times a day  Disp: 1 box 08/20/14   Alveda Reasons, MD  insulin aspart (NOVOLOG) 100 UNIT/ML injection Inject 0-5 Units into the skin at bedtime. 09/20/17   Epifanio Lesches, MD  insulin aspart (NOVOLOG) 100 UNIT/ML injection Inject 6 Units into the skin 3 (three) times daily with meals. 09/20/17   Epifanio Lesches, MD  insulin aspart (NOVOLOG) 100 UNIT/ML injection Inject 0-15 Units into the skin 3 (three) times daily with meals. 09/20/17   Epifanio Lesches, MD  insulin detemir (LEVEMIR) 100 UNIT/ML injection Inject 0.25 mLs (25 Units total) into  the skin 2 (two) times daily. 09/21/17   Epifanio Lesches, MD  Insulin Pen Needle 31G X 8 MM MISC 1 Container by Does not apply route QID. qid--any brand 08/20/14   Alveda Reasons, MD  INSULIN SYRINGE 1CC/29G 29G X 1/2" 1 ML MISC Use as directed with insulin 08/20/14   [provider]  Insulin Syringes, Disposable, U-100 1 ML MISC Use as directed with insulin 08/20/14   Alveda Reasons, MD  Lancets (ACCU-CHEK MULTICLIX) lancets Use as instructed.  check blood sugar up to 3 times a day Disp:  1 box 08/20/14   Alveda Reasons, MD  losartan (COZAAR) 25 MG tablet Take 25 mg daily by mouth.    [provider]  metoprolol tartrate (LOPRESSOR) 25 MG tablet Take 1 tablet (25 mg total) by mouth 2 (two) times daily. 03/07/17   Vaughan Basta, MD  Multiple Vitamin (MULTIVITAMIN WITH MINERALS) TABS tablet Take 1 tablet by mouth daily.    [provider]  omeprazole (PRILOSEC OTC) 20 MG tablet Take 20 mg every other day by mouth.    [provider]  oxymetazoline (AFRIN) 0.05 % nasal spray Place 1 spray  into both nostrils daily as needed for congestion.    [provider]  PROAIR HFA 108 [id number] Base) MCG/ACT inhaler Inhale 2 puffs into the lungs every 4 (four) hours as needed. 09/04/17   [provider]  SYMBICORT 80-4.5 MCG/ACT inhaler Inhale 1 puff into the lungs 2 (two) times daily.  09/10/17   [provider]      VITAL SIGNS:  Blood pressure (!) 191/114, pulse (!) 104, temperature 99.7 F (37.6 C), resp. rate 14, height 5\' 10"  (1.778 m), weight 124.1 kg, SpO2 100 %.  PHYSICAL EXAMINATION:   Physical Exam  Constitutional: He is oriented to person, place, and time. No distress.  HENT:  Head: Normocephalic.  Eyes: No scleral icterus.  Neck: Normal range of motion. Neck supple. No JVD present. No tracheal deviation present.  Cardiovascular: Normal rate, regular rhythm and normal heart sounds. Exam reveals no gallop and no  friction rub.  No murmur heard. Pulmonary/Chest: Effort normal and breath sounds normal. No respiratory distress. He has no wheezes. He has no rales. He exhibits no tenderness.  Abdominal: Soft. Bowel sounds are normal. He exhibits no distension and no mass. There is no tenderness. There is no rebound and no guarding.  Musculoskeletal: Normal range of motion. He exhibits no edema.  Neurological: He is alert and oriented to person, place, and time.  Skin: Skin is warm. No rash noted. No erythema.  Psychiatric: Judgment normal.      LABORATORY PANEL:   CBC Recent Labs  Lab 06/19/18 1308  WBC 3.9  HGB 10.2*  HCT 30.7*  PLT 102*   ------------------------------------------------------------------------------------------------------------------  Chemistries  Recent Labs  Lab 06/19/18 1308  NA 125*  K 4.4  CL 85*  CO2 22  GLUCOSE 701*  BUN 67*  CREATININE 8.80*  CALCIUM 8.2*   ------------------------------------------------------------------------------------------------------------------  Cardiac Enzymes No results for input(s): TROPONINI in the last 168 hours. ------------------------------------------------------------------------------------------------------------------  RADIOLOGY:  No results found.  EKG:  Sinus tachycardia heart rate 120 without ST elevation or depression  IMPRESSION AND PLAN:   57 year old male with uncontrolled diabetes and end-stage renal disease on hemodialysis who presents to the ER due to complication of AV access site and found to have DKA.  1.  DKA: Patient will be admitted to stepdown unit on DKA protocol. Case discussed with intensivist  Continue to monitor BMP and blood sugars. Check A1c 2.  Hyponatremia due to severe hyperglycemia which should correct to 136 taking in account of the blood sugar.  3.  End-stage renal disease on hemodialysis with complication of access site: Vascular surgery consultation  4.  EtOH  dependence: CIWA protocol initiated  5.  Morbid obesity: Patient reports that he has lost weight.  Continue to encourage weight loss. All the records are reviewed and case discussed with ED provider. Management plans discussed with the patient and he is in agreement  CODE STATUS: Full  TOTAL TIME TAKING CARE OF THIS PATIENT: 50 minutes.    Chrisa Hassan M.D on 06/19/2018 at 3:02 PM  Between 7am to 6pm - Pager - [phone]  After 6pm go to [email or web address] - password [email or web address]  Office  [phone]  CC: Primary care physician; [name], MD

## 2018-06-19 NOTE — Progress Notes (Signed)
Family Meeting Note  Advance Directive:no  Today a meeting took place with the Patient.   The following clinical team members were present during this meeting:MD  The following were discussed:Patient's diagnosis: esrd on hd dka obesity, Patient's progosis: > 12 months and Goals for treatment: Full Code  Additional follow-up to be provided: chaplain consult to create AD  Time spent during discussion:16 minutes  Steven [name], Ulice Bold, MD

## 2018-06-19 NOTE — Progress Notes (Signed)
Pre HD assessment    06/19/18 2040  Neurological  Level of Consciousness Alert  Orientation Level Oriented X4  Respiratory  Respiratory Pattern Dyspnea with exertion;Tachypnea  Chest Assessment Chest expansion symmetrical  Cardiac  ECG Monitor Yes  Cardiac Rhythm ST  Vascular  R Radial Pulse +2  L Radial Pulse +2  Edema Generalized;Right lower extremity;Left lower extremity  Integumentary  Integumentary (WDL) X  Skin Color Appropriate for ethnicity  Musculoskeletal  Musculoskeletal (WDL) X  Generalized Weakness Yes  Assistive Device None  GU Assessment  Genitourinary (WDL) X  Genitourinary Symptoms  (HD)  Psychosocial  Psychosocial (WDL) WDL

## 2018-06-19 NOTE — Procedures (Signed)
Central Venous Dailysis Catheter Placement: TRIPLE LUMEN  Indication: Hemo Dialysis/CRRT   Consent:VERBAL/WRITTEN   Hand washing performed prior to starting the procedure.   Procedure: An active timeout was performed and correct patient, name, & ID confirmed. Patient was positioned correctly for central venous access. Patient was prepped using strict sterile technique including chlorohexadine preps, sterile drape, sterile gown and sterile gloves.  The area was prepped, draped and anesthetized in the usual sterile manner. Patient comfort was obtained.    A Double lumen catheter was placed in RT femoral  Vein There was good blood return, catheter caps were placed on lumens, catheter flushed easily, the line was secured and a sterile dressing and BIO-PATCH applied.   Ultrasound was used to visualize vasculature and guidance of needle.   Number of Attempts: 1 Complications:none  Estimated Blood Loss: none Operator: Lexys Milliner.  I first attempted a RT IJ, I was NOT able to thread wire and may have possible blood clot. Recommend vasc surgery consultation and recommendation.   Corrin Parker, M.D.  [facility]  Medical Director [place] Director [facility]

## 2018-06-19 NOTE — ED Provider Notes (Signed)
[facility] Emergency Department Provider Note ____________________________________________   I have reviewed the triage vital signs and the triage nursing note.  HISTORY  Chief Complaint Vascular Access Problem   Historian Patient  HPI Steven [name] is a 57 y.o. male end-stage renal, sent here after vascular access not working when he presented for dialysis.  It sounds like he had declotting procedure, and presented for dialysis today and access was not working.  I spoke with Dr. Juleen China who recommended patient may need dialysis given its been 8 days since his last dialysis.  Patient states no chest pain.  States he is been a little short of breath.  States that he is under his dry weight.  No chest pain.  No headache.  No focal weakness or numbness.     Past Medical History:  Diagnosis Date  . Anemia   . Cellulitis and abscess of right lower extremity 03/01/2017  . Chronic kidney disease    dialysis m,w,f  . Diabetes mellitus without complication ([place])   . Edema extremities 03/01/2017   bilateral swelling  . GERD (gastroesophageal reflux disease)   . High cholesterol   . High triglycerides   . Hypertension   . Neuropathy   . Sleep apnea    can't use cpap d/t feelings of suffocation    Patient Active Problem List   Diagnosis Date Noted  . DKA (diabetic ketoacidoses) ([place]) 09/18/2017  . End stage renal disease ([place]) 04/07/2017  . Complication of renal dialysis 04/07/2017  . Cellulitis in diabetic foot ([place]) 02/27/2017  . Acute on chronic renal failure ([place]) 02/27/2017  . Cellulitis 02/27/2017  . DIABETIC  RETINOPATHY 03/03/2009  . SLEEP APNEA 01/24/2009  . EDEMA 12/16/2008  . Diabetes mellitus type 2 in obese ([place]) 02/19/2008  . Hyperlipidemia 02/19/2008  . Morbid obesity ([place]) 02/19/2008  . DEPRESSION 02/19/2008  . Essential hypertension 06/17/2007  . GERD 06/17/2007    Past Surgical History:  Procedure Laterality Date  . A/V  FISTULAGRAM Right 08/27/2017   Procedure: A/V FISTULAGRAM;  Surgeon: Katha Cabal, MD;  Location: [place] [facility];  Service: Cardiovascular;  Laterality: Right;  . APPENDECTOMY  2010  . AV FISTULA PLACEMENT Right 06/14/2017   Procedure: ARTERIOVENOUS (AV) FISTULA CREATION WRIST;  Surgeon: Katha Cabal, MD;  Location: [facility];  Service: Vascular;  Laterality: Right;  . DIALYSIS/PERMA CATHETER INSERTION N/A 03/05/2017   Procedure: Dialysis/Perma Catheter Insertion;  Surgeon: Katha Cabal, MD;  Location: [place] [facility];  Service: Cardiovascular;  Laterality: N/A;  . DIALYSIS/PERMA CATHETER INSERTION N/A 09/20/2017   Procedure: DIALYSIS/PERMA CATHETER INSERTION;  Surgeon: Katha Cabal, MD;  Location: [place] [facility];  Service: Cardiovascular;  Laterality: N/A;  . EXCHANGE OF A DIALYSIS CATHETER  03/29/2017   Procedure: Exchange Of A Dialysis Catheter;  Surgeon: Katha Cabal, MD;  Location: [place] [facility];  Service: Cardiovascular;;  . IRRIGATION AND DEBRIDEMENT FOOT Right 03/01/2017   Procedure: IRRIGATION AND DEBRIDEMENT FOOT;  Surgeon: Albertine Patricia, DPM;  Location: [facility];  Service: Podiatry;  Laterality: Right;  application of wound vac  . PERIPHERAL VASCULAR THROMBECTOMY Right 06/18/2018   Procedure: PERIPHERAL VASCULAR THROMBECTOMY;  Surgeon: Algernon Huxley, MD;  Location: [place] [facility];  Service: Cardiovascular;  Laterality: Right;    Prior to Admission medications   Medication Sig Start Date End Date Taking? Authorizing Provider  acarbose (PRECOSE) 100 MG tablet Take 100 mg 3 (three) times daily with meals by mouth.  [provider]  amitriptyline (ELAVIL) 25 MG tablet Take 25 mg by mouth at bedtime.    [provider]  aspirin EC 81 MG tablet TAKE ONE  BY MOUTH EVERY DAY 09/15/12   Alveda Reasons, MD  furosemide (LASIX) 80 MG tablet Take 1 tablet (80 mg total) by mouth daily. Patient taking differently: Take  160 mg by mouth 2 (two) times daily.  03/08/17   Vaughan Basta, MD  gabapentin (NEURONTIN) 300 MG capsule Take 1 capsule (300 mg total) by mouth 2 (two) times daily. 03/07/17   Vaughan Basta, MD  glucose blood (ACCU-CHEK AVIVA) test strip Use as instructed.use to check blood sugar up to 3 times a day  Disp: 1 box 08/20/14   Alveda Reasons, MD  insulin aspart (NOVOLOG) 100 UNIT/ML injection Inject 0-5 Units into the skin at bedtime. 09/20/17   Epifanio Lesches, MD  insulin aspart (NOVOLOG) 100 UNIT/ML injection Inject 6 Units into the skin 3 (three) times daily with meals. 09/20/17   Epifanio Lesches, MD  insulin aspart (NOVOLOG) 100 UNIT/ML injection Inject 0-15 Units into the skin 3 (three) times daily with meals. 09/20/17   Epifanio Lesches, MD  insulin detemir (LEVEMIR) 100 UNIT/ML injection Inject 0.25 mLs (25 Units total) into the skin 2 (two) times daily. 09/21/17   Epifanio Lesches, MD  Insulin Pen Needle 31G X 8 MM MISC 1 Container by Does not apply route QID. qid--any brand 08/20/14   Alveda Reasons, MD  INSULIN SYRINGE 1CC/29G 29G X 1/2" 1 ML MISC Use as directed with insulin 08/20/14   [provider]  Insulin Syringes, Disposable, U-100 1 ML MISC Use as directed with insulin 08/20/14   Alveda Reasons, MD  Lancets (ACCU-CHEK MULTICLIX) lancets Use as instructed.  check blood sugar up to 3 times a day Disp:  1 box 08/20/14   Alveda Reasons, MD  losartan (COZAAR) 25 MG tablet Take 25 mg daily by mouth.    [provider]  metoprolol tartrate (LOPRESSOR) 25 MG tablet Take 1 tablet (25 mg total) by mouth 2 (two) times daily. 03/07/17   Vaughan Basta, MD  Multiple Vitamin (MULTIVITAMIN WITH MINERALS) TABS tablet Take 1 tablet by mouth daily.    [provider]  omeprazole (PRILOSEC OTC) 20 MG tablet Take 20 mg every other day by mouth.    [provider]  oxymetazoline (AFRIN) 0.05 % nasal spray Place 1 spray  into both nostrils daily as needed for congestion.    [provider]  PROAIR HFA [phone] Base) MCG/ACT inhaler Inhale 2 puffs into the lungs every 4 (four) hours as needed. 09/04/17   [provider]  SYMBICORT 80-4.5 MCG/ACT inhaler Inhale 1 puff into the lungs 2 (two) times daily.  09/10/17   [provider]    Allergies  Allergen Reactions  . Codeine Nausea And Vomiting    Family History  Problem Relation Age of Onset  . CAD Father     Social History Social History   Tobacco Use  . Smoking status: Never Smoker  . Smokeless tobacco: Current User    Types: Snuff  Substance Use Topics  . 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 Drug use: No    Review of Systems  Constitutional: Negative for fever. Eyes: Negative for visual changes. ENT: Negative for sore throat. Cardiovascular: Negative for chest pain. Respiratory: Negative for shortness of breath. Gastrointestinal: Negative  for abdominal pain, vomiting and diarrhea. Genitourinary: Negative for dysuria. Musculoskeletal: Negative for back pain. Skin: Negative for rash. Neurological: Negative for headache.  ____________________________________________   PHYSICAL EXAM:  VITAL SIGNS: ED Triage Vitals  Enc Vitals Group     BP 06/19/18 1209 (!) 100/59     Pulse Rate 06/19/18 1209 (!) 126     Resp 06/19/18 1209 16     Temp 06/19/18 1209 99.7 F (37.6 C)     Temp src --      SpO2 06/19/18 1209 (!) 88 %     Weight 06/19/18 1210 273 lb 9.5 oz (124.1 kg)     Height 06/19/18 1210 5\' 10"  (1.778 m)     Head Circumference --      Peak Flow --      Pain Score 06/19/18 1209 1     Pain Loc --      Pain Edu? --      Excl. in [place]? --      Constitutional: Alert and oriented.  HEENT      Head: Normocephalic and atraumatic.      Eyes: Conjunctivae are normal. Pupils equal and round.       Ears:         Nose: No congestion/rhinnorhea.      Mouth/Throat:  Mucous membranes are moist.      Neck: No stridor. Cardiovascular/Chest: Normal rate, regular rhythm.  No murmurs, rubs, or gallops.  Right forearm vascular access. Respiratory: Normal respiratory effort without tachypnea nor retractions. Breath sounds are clear and equal bilaterally. No wheezes/rales/rhonchi. Gastrointestinal: Soft. No distention, no guarding, no rebound. Nontender.  Obese Genitourinary/rectal:Deferred Musculoskeletal: Nontender with normal range of motion in all extremities. No joint effusions.  No lower extremity tenderness.  No edema. Neurologic:  Normal speech and language. No gross or focal neurologic deficits are appreciated. Skin:  Skin is warm, dry and intact. No rash noted. Psychiatric: Mood and affect are normal. Speech and behavior are normal. Patient exhibits appropriate insight and judgment.   ____________________________________________  LABS (pertinent positives/negatives) I, Lisa Roca, MD the attending physician have reviewed the labs noted below.  Labs Reviewed  BASIC METABOLIC PANEL - Abnormal; Notable for the following components:      Result Value   Sodium 125 (*)    Chloride 85 (*)    Glucose, Bld 701 (*)    BUN 67 (*)    Creatinine, Ser 8.80 (*)    Calcium 8.2 (*)    GFR calc non Af Amer 6 (*)    GFR calc Af Amer 7 (*)    Anion gap 18 (*)    All other components within normal limits  CBC WITH DIFFERENTIAL/PLATELET - Abnormal; Notable for the following components:   RBC 3.13 (*)    Hemoglobin 10.2 (*)    HCT 30.7 (*)    RDW 14.6 (*)    Platelets 102 (*)    Lymphs Abs 0.4 (*)    All other components within normal limits    ____________________________________________    EKG I, Lisa Roca, MD, the attending physician have personally viewed and interpreted all ECGs.  120 bpm. Sinus tachy.  Narrow qrs.  Nonspecific ST-T  wave. ____________________________________________  RADIOLOGY   None __________________________________________  PROCEDURES  Procedure(s) performed: None  Procedures  Critical Care performed: CRITICAL CARE Performed by: Lisa Roca   Total critical care time: 30 minutes  Critical care time was exclusive of separately billable procedures and treating other patients.  Critical care  was necessary to treat or prevent imminent or life-threatening deterioration.  Critical care was time spent personally by me on the following activities: development of treatment plan with patient and/or surrogate as well as nursing, discussions with consultants, evaluation of patient's response to treatment, examination of patient, obtaining history from patient or surrogate, ordering and performing treatments and interventions, ordering and review of laboratory studies, ordering and review of radiographic studies, pulse oximetry and re-evaluation of patient's condition.    ____________________________________________  ED COURSE / ASSESSMENT AND PLAN  Pertinent labs & imaging results that were available during my care of the patient were reviewed by me and considered in my medical decision making (see chart for details).     Patient presents to be evaluated after missing dialysis due to vascular access not working.  Stable vital signs.  No hypoxia.  But he does state he is been short of breath.  I would suspect he is probably volume overloaded although he is reporting that he is actually under his dry weight.  On laboratory studies, found to have elevated blood sugar and consistent with DKA.  Also elevated BUN and creatinine with normal potassium.  We will start patient on insulin drip by glucose stabilizer for DKA.  Not starting with large fluid bolus secondary to patient is a dialysis patient without access at present.  Patient will be admitted to the hospital service.    CONSULTATIONS:  Hospitalist for admission, discussed with Dr. Posey Pronto.   Patient / Family / Caregiver informed of clinical course, medical decision-making process, and agree with plan.    ___________________________________________   FINAL CLINICAL IMPRESSION(S) / ED DIAGNOSES   Final diagnoses:  Diabetic ketoacidosis without coma associated with diabetes mellitus due to underlying condition ([place])  Problem with vascular access      ___________________________________________         Note: This dictation was prepared with Dragon dictation. Any transcriptional errors that result from this process are unintentional    Lisa Roca, MD 06/19/18 1459

## 2018-06-19 NOTE — ED Notes (Signed)
Date and time results received: 06/19/18 1359 (use smartphrase ".now" to insert current time)  Test: Glucose Critical Value: 701  Name of Provider Notified: Lord  Orders Received? Or Actions Taken?: MD aware

## 2018-06-19 NOTE — Progress Notes (Signed)
HD tx start    06/19/18 2052  Vital Signs  Pulse Rate (!) 106  Pulse Rate Source Monitor  Resp 17  BP 118/71  BP Location Left Arm  BP Method Automatic  Patient Position (if appropriate) Lying  Oxygen Therapy  SpO2 93 %  O2 Device Room Air  During Hemodialysis Assessment  Blood Flow Rate (mL/min) 250 mL/min  Arterial Pressure (mmHg) -90 mmHg  Venous Pressure (mmHg) 130 mmHg  Transmembrane Pressure (mmHg) 80 mmHg  Ultrafiltration Rate (mL/min) 750 mL/min  Dialysate Flow Rate (mL/min) 500 ml/min  Conductivity: Machine  13.9  HD Safety Checks Performed Yes  Dialysis Fluid Bolus Normal Saline  Bolus Amount (mL) 250 mL  Intra-Hemodialysis Comments Tx initiated  Hemodialysis Catheter Right Femoral vein Temporary  Placement Date/Time: 06/19/18 1824   Placed prior to admission: No  Time Out: Correct patient;Correct site;Correct procedure  Maximum sterile barrier precautions: Hand hygiene;Cap;Mask;Sterile gown;Sterile gloves;Large sterile sheet  Site Prep: Chlorh...  Blue Lumen Status Infusing  Red Lumen Status Infusing

## 2018-06-19 NOTE — H&P (Addendum)
PATIENT NAME: Steven [name]    MR#:  [id number]  DATE OF BIRTH:  06/24/1961  DATE OF ADMISSION:  06/19/2018  PRIMARY CARE PHYSICIAN: [name], MD   REQUESTING/REFERRING PHYSICIAN: MODY  CHIEF COMPLAINT:  Complication of dialysis access and elevated blood sugars HISTORY OF PRESENT ILLNESS:  Steven [name]  is a 57 y.o. male with a known history of stage renal disease requiring hemodialysis and uncontrolled diabetes who presents to the emergency room due to complication of his dialysis device which has thrombosed associated with elevated sugars which needs insulin infusion  Patient was noted to have blood sugars in the 700s and anion gap.  He was not able to get access to his insulin and had a very bad diet this AM.  Patient has a thrombosed RT arm fistula s/p thrombectomy Patient went to HD and access did not work  Patient did NOT have HD for 8 days  Vascular saw the patient yesterday and underwent angiography and intervention however today when he went to dialysis they  access was not working   Patient started on insulin drip and IV fluids. Case discussed with Dr. Holley Raring Patient needs temp dialysis catheter for HD today  PAST MEDICAL HISTORY:   Past Medical History:  Diagnosis Date  . Anemia   . Cellulitis and abscess of right lower extremity 03/01/2017  . Chronic kidney disease    dialysis m,w,f  . Diabetes mellitus without complication ([place])   . Edema extremities 03/01/2017   bilateral swelling  . GERD (gastroesophageal reflux disease)   . High cholesterol   . High triglycerides   . Hypertension   . Neuropathy   . Sleep apnea    can't use cpap d/t feelings of suffocation    PAST SURGICAL HISTORY:   Past Surgical History:  Procedure Laterality Date  . A/V FISTULAGRAM Right 08/27/2017   Procedure: A/V FISTULAGRAM;  Surgeon: Katha Cabal, MD;  Location: [place] [facility];  Service: Cardiovascular;  Laterality: Right;  . APPENDECTOMY  2010  . AV  FISTULA PLACEMENT Right 06/14/2017   Procedure: ARTERIOVENOUS (AV) FISTULA CREATION WRIST;  Surgeon: Katha Cabal, MD;  Location: [facility];  Service: Vascular;  Laterality: Right;  . DIALYSIS/PERMA CATHETER INSERTION N/A 03/05/2017   Procedure: Dialysis/Perma Catheter Insertion;  Surgeon: Katha Cabal, MD;  Location: [place] [facility];  Service: Cardiovascular;  Laterality: N/A;  . DIALYSIS/PERMA CATHETER INSERTION N/A 09/20/2017   Procedure: DIALYSIS/PERMA CATHETER INSERTION;  Surgeon: Katha Cabal, MD;  Location: [place] [facility];  Service: Cardiovascular;  Laterality: N/A;  . EXCHANGE OF A DIALYSIS CATHETER  03/29/2017   Procedure: Exchange Of A Dialysis Catheter;  Surgeon: Katha Cabal, MD;  Location: [place] [facility];  Service: Cardiovascular;;  . IRRIGATION AND DEBRIDEMENT FOOT Right 03/01/2017   Procedure: IRRIGATION AND DEBRIDEMENT FOOT;  Surgeon: Albertine Patricia, DPM;  Location: [facility];  Service: Podiatry;  Laterality: Right;  application of wound vac  . PERIPHERAL VASCULAR THROMBECTOMY Right 06/18/2018   Procedure: PERIPHERAL VASCULAR THROMBECTOMY;  Surgeon: Algernon Huxley, MD;  Location: [place] [facility];  Service: Cardiovascular;  Laterality: Right;    SOCIAL HISTORY:   Social History   Tobacco Use  . Smoking status: Never Smoker  . Smokeless tobacco: Current User    Types: Snuff  Substance Use Topics  . Alcohol use: Yes    Comment: up until 01/2017 was heavy drinker...4 40 oz qd    FAMILY HISTORY:   Family History  Problem Relation Age of Onset  . CAD Father     DRUG ALLERGIES:   Allergies  Allergen Reactions  . Codeine Nausea And Vomiting    REVIEW OF SYSTEMS:   Review of Systems  Constitutional: Negative.  Negative for chills, fever and malaise/fatigue.  HENT: Negative.  Negative for ear discharge, ear pain, hearing loss, nosebleeds and sore throat.   Eyes: Negative.  Negative for blurred vision and pain.  Respiratory:  Negative for cough, hemoptysis, shortness of breath and wheezing.   Cardiovascular: Positive for leg swelling. Negative for chest pain and palpitations.  Gastrointestinal: Negative.  Negative for abdominal pain, blood in stool, diarrhea, nausea and vomiting.  Genitourinary: Positive for frequency. Negative for dysuria.  Musculoskeletal: Negative.  Negative for back pain.  Skin: Negative.   Neurological: Negative for dizziness, tremors, speech change, focal weakness, seizures and headaches.  Endo/Heme/Allergies: Negative.  Does not bruise/bleed easily.  Psychiatric/Behavioral: Negative.  Negative for depression, hallucinations and suicidal ideas.    MEDICATIONS AT HOME:   Prior to Admission medications   Medication Sig Start Date End Date Taking? Authorizing Provider  acarbose (PRECOSE) 100 MG tablet Take 100 mg 3 (three) times daily with meals by mouth.    [provider]  amitriptyline (ELAVIL) 25 MG tablet Take 25 mg by mouth at bedtime.    [provider]  aspirin EC 81 MG tablet TAKE ONE  BY MOUTH EVERY DAY 09/15/12   Alveda Reasons, MD  furosemide (LASIX) 80 MG tablet Take 1 tablet (80 mg total) by mouth daily. Patient taking differently: Take 160 mg by mouth 2 (two) times daily.  03/08/17   Vaughan Basta, MD  gabapentin (NEURONTIN) 300 MG capsule Take 1 capsule (300 mg total) by mouth 2 (two) times daily. 03/07/17   Vaughan Basta, MD  glucose blood (ACCU-CHEK AVIVA) test strip Use as instructed.use to check blood sugar up to 3 times a day  Disp: 1 box 08/20/14   Alveda Reasons, MD  insulin aspart (NOVOLOG) 100 UNIT/ML injection Inject 0-5 Units into the skin at bedtime. 09/20/17   Epifanio Lesches, MD  insulin aspart (NOVOLOG) 100 UNIT/ML injection Inject 6 Units into the skin 3 (three) times daily with meals. 09/20/17   Epifanio Lesches, MD  insulin aspart (NOVOLOG) 100 UNIT/ML injection Inject 0-15 Units into the skin 3 (three) times daily  with meals. 09/20/17   Epifanio Lesches, MD  insulin detemir (LEVEMIR) 100 UNIT/ML injection Inject 0.25 mLs (25 Units total) into the skin 2 (two) times daily. 09/21/17   Epifanio Lesches, MD  Insulin Pen Needle 31G X 8 MM MISC 1 Container by Does not apply route QID. qid--any brand 08/20/14   Alveda Reasons, MD  INSULIN SYRINGE 1CC/29G 29G X 1/2" 1 ML MISC Use as directed with insulin 08/20/14   [provider]  Insulin Syringes, Disposable, U-100 1 ML MISC Use as directed with insulin 08/20/14   Alveda Reasons, MD  Lancets (ACCU-CHEK MULTICLIX) lancets Use as instructed.  check blood sugar up to 3 times a day Disp:  1 box 08/20/14   Alveda Reasons, MD  losartan (COZAAR) 25 MG tablet Take 25 mg daily by mouth.    [provider]  metoprolol tartrate (LOPRESSOR) 25 MG tablet Take 1 tablet (25 mg total) by mouth 2 (two) times daily. 03/07/17   Vaughan Basta, MD  Multiple Vitamin (MULTIVITAMIN WITH MINERALS) TABS tablet Take 1 tablet by mouth daily.    [provider]  omeprazole (  PRILOSEC OTC) 20 MG tablet Take 20 mg every other day by mouth.    [provider]  oxymetazoline (AFRIN) 0.05 % nasal spray Place 1 spray into both nostrils daily as needed for congestion.    [provider]  PROAIR HFA 108 [phone] Base) MCG/ACT inhaler Inhale 2 puffs into the lungs every 4 (four) hours as needed. 09/04/17   [provider]  SYMBICORT 80-4.5 MCG/ACT inhaler Inhale 1 puff into the lungs 2 (two) times daily.  09/10/17   [provider]      VITAL SIGNS:  Blood pressure (!) 157/88, pulse (!) 104, temperature 98.4 F (36.9 C), resp. rate 14, height 5\' 10"  (1.778 m), weight 123.1 kg, SpO2 100 %.  PHYSICAL EXAMINATION:   Physical Exam  Constitutional: He is oriented to person, place, and time. No distress.  HENT:  Head: Normocephalic.  Eyes: No scleral icterus.  Neck: Normal range of motion. Neck supple. No JVD present.  No tracheal deviation present.  Cardiovascular: Normal rate, regular rhythm and normal heart sounds. Exam reveals no gallop and no friction rub.  No murmur heard. Pulmonary/Chest: Effort normal and breath sounds normal. No respiratory distress. He has no wheezes. He has no rales. He exhibits no tenderness.  Abdominal: Soft. Bowel sounds are normal. He exhibits no distension and no mass. There is no tenderness. There is no rebound and no guarding.  Musculoskeletal: Normal range of motion. He exhibits no edema.  Neurological: He is alert and oriented to person, place, and time.  Skin: Skin is warm. No rash noted. No erythema.  Psychiatric: Judgment normal.      LABORATORY PANEL:   CBC Recent Labs  Lab 06/19/18 1634  WBC 3.5*  HGB 10.0*  HCT 30.0*  PLT 100*   ------------------------------------------------------------------------------------------------------------------  Chemistries  Recent Labs  Lab 06/19/18 1634  NA 127*  K 3.9  CL 87*  CO2 24  GLUCOSE 547*  BUN 63*  CREATININE 8.83*  CALCIUM 8.4*  MG 1.6*   ------------------------------------------------------------------------------------------------------------------   IMPRESSION AND PLAN:   57 year old male with uncontrolled diabetes and end-stage renal disease on hemodialysis who presents to the ER due to complication of AV access site and found to have severe DKA.  1. DKA: Patient will be admitted to stepdown unit on DKA protocol. Insulin drip protocol  Continue to monitor BMP and blood sugars. Check A1c  2.  Hyponatremia due to severe hyperglycemia which should correct to 136 taking in account of the blood sugar.  3.  End-stage renal disease on hemodialysis with complication of access site: Vascular surgery consultation I have attempted RT IJ and it seems that patient may have IJ thrombosis as guidewire would not thread  I then proceeded to place Rt Femoral Vasc cath  4.  EtOH dependence: CIWA  protocol initiated  5.  Morbid obesity: Patient reports that he has lost weight.  Continue to encourage weight loss. All the records are reviewed and case discussed with ED provider. Management plans discussed with the patient and he is in agreement   Patient satisfied with Plan of action and management. All questions answered  Corrin Parker, M.D.  [facility]  Medical Director [place] Director [facility] Cardio-Pulmonary Department

## 2018-06-19 NOTE — Progress Notes (Signed)
Post HD assessment    06/19/18 2308  Neurological  Level of Consciousness Alert  Orientation Level Oriented X4  Respiratory  Respiratory Pattern Dyspnea with exertion  Chest Assessment Chest expansion symmetrical  Cardiac  ECG Monitor Yes  Cardiac Rhythm ST  Vascular  R Radial Pulse +2  L Radial Pulse +2  Edema Generalized;Right lower extremity;Left lower extremity  Integumentary  Integumentary (WDL) X  Skin Color Appropriate for ethnicity  Musculoskeletal  Musculoskeletal (WDL) X  Generalized Weakness Yes  Assistive Device None  GU Assessment  Genitourinary (WDL) X  Genitourinary Symptoms  (HD)  Psychosocial  Psychosocial (WDL) WDL

## 2018-06-19 NOTE — Progress Notes (Signed)
[facility]  ROUNDING NOTE   Subjective:  Patient well-known to [place]. He had access intervention yesterday but unfortunately his dialysis access appears to be clotted again today. When he went to be cannulated today clots were pulled out of the Axis. Temporary dialysis catheter being placed by critical care at the moment.    Objective:  Vital signs in last 24 hours:  Temp:  [98.4 F (36.9 C)-99.7 F (37.6 C)] 98.4 F (36.9 C) (09/12 1605) Pulse Rate:  [104-126] 104 (09/12 1441) Resp:  [14-16] 14 (09/12 1441) BP: (100-191)/(59-114) 157/88 (09/12 1628) SpO2:  [88 %-100 %] 100 % (09/12 1441) Weight:  [123.1 kg-124.1 kg] 123.1 kg (09/12 1605)  Weight change:  Filed Weights   06/19/18 1210 06/19/18 1605  Weight: 124.1 kg 123.1 kg    Intake/Output: No intake/output data recorded.   Intake/Output this shift:  Total I/O In: -  Out: 375 [Urine:375]  Physical Exam: General: No acute distress  Head: Normocephalic, atraumatic. Moist oral mucosal membranes  Eyes: Anicteric  Neck: Supple, trachea midline  Lungs:  Clear to auscultation, normal effort  Heart: S1S2 no rubs  Abdomen:  Soft, nontender, bowel sounds present  Extremities: Trace peripheral edema.  Neurologic: Awake, alert, following commands  Skin: No lesions  Access: Clotted RUE AVF    Basic Metabolic Panel: Recent Labs  Lab 06/19/18 1308 06/19/18 1634  NA 125* 127*  K 4.4 3.9  CL 85* 87*  CO2 22 24  GLUCOSE 701* 547*  BUN 67* 63*  CREATININE 8.80* 8.83*  CALCIUM 8.2* 8.4*  MG  --  1.6*  PHOS  --  2.2*    Liver Function Tests: No results for input(s): AST, ALT, ALKPHOS, BILITOT, PROT, ALBUMIN in the last 168 hours. No results for input(s): LIPASE, AMYLASE in the last 168 hours. No results for input(s): AMMONIA in the last 168 hours.  CBC: Recent Labs  Lab 06/19/18 1308 06/19/18 1634  WBC 3.9 3.5*  NEUTROABS 3.2  --   HGB 10.2* 10.0*  HCT 30.7* 30.0*  MCV 98.0 98.0  PLT 102*  100*    Cardiac Enzymes: No results for input(s): CKTOTAL, CKMB, CKMBINDEX, TROPONINI in the last 168 hours.  BNP: Invalid input(s): POCBNP  CBG: Recent Labs  Lab 06/18/18 0909 06/18/18 1114 06/19/18 1512 06/19/18 1559  GLUCAP 128* 146* >600* >600*    Microbiology: Results for orders placed or performed during the hospital encounter of 09/18/17  MRSA PCR Screening     Status: None   Collection Time: 09/18/17  2:34 PM  Result Value Ref Range Status   MRSA by PCR NEGATIVE NEGATIVE Final    Comment:        The GeneXpert MRSA Assay (FDA approved for NASAL specimens only), is one component of a comprehensive MRSA colonization surveillance program. It is not intended to diagnose MRSA infection nor to guide or monitor treatment for MRSA infections.     Coagulation Studies: No results for input(s): LABPROT, INR in the last 72 hours.  Urinalysis: No results for input(s): COLORURINE, LABSPEC, PHURINE, GLUCOSEU, HGBUR, BILIRUBINUR, KETONESUR, PROTEINUR, UROBILINOGEN, NITRITE, LEUKOCYTESUR in the last 72 hours.  Invalid input(s): APPERANCEUR    Imaging: No results found.   Medications:   . sodium chloride    . dextrose 5 % and 0.45% NaCl    . insulin (NOVOLIN-R) infusion     . amitriptyline  25 mg Oral QHS  . aspirin EC  81 mg Oral Daily  . [START ON 06/20/2018] Chlorhexidine Gluconate  Cloth  6 each Topical [id number]  . folic acid  1 mg Oral Daily  . gabapentin  300 mg Oral BID  . heparin injection (subcutaneous)  5,000 Units Subcutaneous Q8H  . LORazepam  0-4 mg Oral Q6H   Followed by  . [START ON 06/21/2018] LORazepam  0-4 mg Oral Q12H  . losartan  25 mg Oral Daily  . metoprolol tartrate  25 mg Oral BID  . multivitamin with minerals  1 tablet Oral Daily  . pantoprazole  40 mg Oral QODAY  . thiamine  100 mg Oral Daily   Or  . thiamine  100 mg Intravenous Daily   LORazepam **OR** LORazepam  Assessment/ Plan:  57 y.o. male with hypertension, diabetes mellitus  type 2 insulin dependent, hyperlipidemia, GERD, and morbid obesity  [facility]/MWF  1. 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 -Patient has not had dialysis in 8 days.  Temporary dialysis catheter being placed by critical care.  He had access intervention June 18, 2018 however access now clotted again.  We will proceed with hemodialysis tonight.  We plan to perform dialysis for 2.5 hours with a blood flow rate of 250, dialysate flow rate of 500, and ultrafiltration target 1 kg.  2. Hypertension:  Maintain the patient on losartan as well as metoprolol.  3. Anemia of chronic kidney disease: Hemoglobin currently 10.0.  Hold off on Epogen for now.  4. Secondary Hyperparathyroidism:  Evaluate PTH with dialysis today.  5.  Hyponatremia.  Likely due to missed dialysis.  Should be corrected with dialysis today.   LOS: 0 Saadia Dewitt 9/12/20195:46 PM

## 2018-06-19 NOTE — Progress Notes (Signed)
Pre HD assessment   06/19/18 2039  Vital Signs  Temp 98.9 F (37.2 C)  Temp Source Oral  Pulse Rate (!) 101  Pulse Rate Source Monitor  Resp 16  BP 114/75  BP Location Left Arm  BP Method Automatic  Patient Position (if appropriate) Lying  Oxygen Therapy  SpO2 97 %  O2 Device Room Air  Pain Assessment  Pain Scale 0-10  Pain Score 0  Dialysis Weight  Weight 121.5 kg  Type of Weight Pre-Dialysis  Time-Out for Hemodialysis  What Procedure? HD  Pt Identifiers(min of two) First/Last Name;MRN/Account#  Correct Site? Yes  Correct Side? Yes  Correct Procedure? Yes  Consents Verified? Yes  Rad Studies Available? N/A  Safety Precautions Reviewed? Yes  Engineer, civil (consulting) Number  (6A)  Station Number  (bedside ICU 16)  UF/Alarm Test Passed  Conductivity: Meter 13.8  Conductivity: Machine  13.9  pH 7.6  Reverse Osmosis  (WRO #3)  Normal Saline Lot Number [id number]  Dialyzer Lot Number [id number]  Disposable Set Lot Number [id number]  Machine Temperature 98.6 F (37 C)  Musician and Audible Yes  Blood Lines Intact and Secured Yes  Pre Treatment Patient Checks  Vascular access used during treatment Catheter  Hepatitis B Surface Antigen Results Negative  Date Hepatitis B Surface Antigen Drawn 02/28/17  Hepatitis B Surface Antibody  (<10)  Date Hepatitis B Surface Antibody Drawn 02/28/17  Hemodialysis Consent Verified Yes  Hemodialysis Standing Orders Initiated Yes  ECG (Telemetry) Monitor On Yes  Prime Ordered Normal Saline  Length of  DialysisTreatment -hour(s) 2 Hour(s)  Dialyzer Elisio 17H NR  Dialysate 2K, 2.5 Ca  Dialysis Anticoagulant None  Dialysate Flow Ordered 500  Blood Flow Rate Ordered 250 mL/min  Ultrafiltration Goal 1 Liters  Pre Treatment Labs Hepatitis B Surface Antigen;Phosphorus (PTH, HBSAB)  Dialysis Blood Pressure Support Ordered Normal Saline  Education / Care Plan  Dialysis Education Provided Yes  Documented Education in Care Plan Yes   Hemodialysis Catheter Right Femoral vein Temporary  Placement Date/Time: 06/19/18 1824   Placed prior to admission: No  Time Out: Correct patient;Correct site;Correct procedure  Maximum sterile barrier precautions: Hand hygiene;Cap;Mask;Sterile gown;Sterile gloves;Large sterile sheet  Site Prep: Chlorh...  Site Condition Other (Comment) (Red, non-tender, cool to the touch)  Blue Lumen Status Heparin locked  Red Lumen Status Heparin locked  Purple Lumen Status N/A  Dressing Type Biopatch  Dressing Status Clean;Dry;Intact  Drainage Description None

## 2018-06-19 NOTE — Progress Notes (Signed)
HD tx end    06/19/18 2301  Vital Signs  Pulse Rate (!) 103  Pulse Rate Source Monitor  Resp (!) 21  BP (!) 153/81  BP Location Left Arm  BP Method Automatic  Patient Position (if appropriate) Lying  Oxygen Therapy  SpO2 100 %  O2 Device Nasal Cannula  O2 Flow Rate (L/min) 4 L/min  During Hemodialysis Assessment  Dialysis Fluid Bolus Normal Saline  Bolus Amount (mL) 250 mL  Intra-Hemodialysis Comments Tx completed

## 2018-06-19 NOTE — Progress Notes (Signed)
Post HD assessment. Pt did not tolerate tx well. Pt experienced a drop in BP that required a 100 NS bolus to be given , NP/RN placed pt on pressors in order to pull fluid during tx. Net UF 917, goal not met. Pt's venous access had high pressures that required the BFR to be decreased down to 150, MD aware.    06/19/18 2309  Vital Signs  Temp 98 F (36.7 C)  Temp Source Oral  Pulse Rate (!) 107  Pulse Rate Source Monitor  Resp 13  BP 107/77  BP Location Left Arm  BP Method Automatic  Patient Position (if appropriate) Lying  Oxygen Therapy  SpO2 99 %  O2 Device Nasal Cannula  O2 Flow Rate (L/min) 4 L/min  Dialysis Weight  Weight 120.9 kg  Type of Weight Post-Dialysis  Post-Hemodialysis Assessment  Rinseback Volume (mL) 250 mL  KECN 23.7 V  Dialyzer Clearance Lightly streaked  Duration of HD Treatment -hour(s) 2 hour(s)  Hemodialysis Intake (mL) 600 mL  UF Total -Machine (mL) 1517 mL  Net UF (mL) 917 mL  Tolerated HD Treatment No (Comment)  Post-Hemodialysis Comments  (see progress notes)  Education / Care Plan  Dialysis Education Provided Yes  Documented Education in Care Plan Yes  Hemodialysis Catheter Right Femoral vein Temporary  Placement Date/Time: 06/19/18 1824   Placed prior to admission: No  Time Out: Correct patient;Correct site;Correct procedure  Maximum sterile barrier precautions: Hand hygiene;Cap;Mask;Sterile gown;Sterile gloves;Large sterile sheet  Site Prep: Chlorh...  Site Condition No complications  Blue Lumen Status Heparin locked  Red Lumen Status Heparin locked  Purple Lumen Status N/A  Catheter fill solution Heparin 1000 units/ml  Catheter fill volume (Arterial) 1.8 cc  Catheter fill volume (Venous) 1.8  Dressing Type Biopatch  Dressing Status Clean;Dry;Intact  Drainage Description None  Post treatment catheter status Capped and Clamped

## 2018-06-19 NOTE — ED Triage Notes (Signed)
Has vascular access on right arm that they fixed yesterday in surgery by cleaning out, but went to dialysis today and it doesn't work.  Was sent her to get access until they figure out the fistula.

## 2018-06-20 DIAGNOSIS — T82868A Thrombosis of vascular prosthetic devices, implants and grafts, initial encounter: Secondary | ICD-10-CM

## 2018-06-20 DIAGNOSIS — N186 End stage renal disease: Secondary | ICD-10-CM

## 2018-06-20 LAB — BASIC METABOLIC PANEL
ANION GAP: 13 (ref 5–15)
Anion gap: 12 (ref 5–15)
BUN: 48 mg/dL — ABNORMAL HIGH (ref 6–20)
BUN: 50 mg/dL — AB (ref 6–20)
CHLORIDE: 94 mmol/L — AB (ref 98–111)
CO2: 25 mmol/L (ref 22–32)
CO2: 24 mmol/L (ref 22–32)
Calcium: 8.4 mg/dL — ABNORMAL LOW (ref 8.9–10.3)
Calcium: 8.2 mg/dL — ABNORMAL LOW (ref 8.9–10.3)
Chloride: 93 mmol/L — ABNORMAL LOW (ref 98–111)
Creatinine, Ser: 7.02 mg/dL — ABNORMAL HIGH (ref 0.61–1.24)
Creatinine, Ser: 7.85 mg/dL — ABNORMAL HIGH (ref 0.61–1.24)
GFR calc Af Amer: 8 mL/min — ABNORMAL LOW (ref >60)
GFR calc non Af Amer: 8 mL/min — ABNORMAL LOW (ref >60)
GFR, EST AFRICAN AMERICAN: 9 mL/min — AB (ref >60)
GFR, EST NON AFRICAN AMERICAN: 7 mL/min — AB (ref >60)
GLUCOSE: 169 mg/dL — AB (ref 70–99)
GLUCOSE: 325 mg/dL — AB (ref 70–99)
POTASSIUM: 3.9 mmol/L (ref 3.5–5.1)
Potassium: 3.6 mmol/L (ref 3.5–5.1)
Sodium: 132 mmol/L — ABNORMAL LOW (ref 135–145)
Sodium: 129 mmol/L — ABNORMAL LOW (ref 135–145)

## 2018-06-20 LAB — GLUCOSE, CAPILLARY
GLUCOSE-CAPILLARY: 223 mg/dL — AB (ref 70–99)
Glucose-Capillary: 119 mg/dL — ABNORMAL HIGH (ref 70–99)
Glucose-Capillary: 167 mg/dL — ABNORMAL HIGH (ref 70–99)
Glucose-Capillary: 202 mg/dL — ABNORMAL HIGH (ref 70–99)
Glucose-Capillary: 210 mg/dL — ABNORMAL HIGH (ref 70–99)
Glucose-Capillary: 268 mg/dL — ABNORMAL HIGH (ref 70–99)
Glucose-Capillary: 299 mg/dL — ABNORMAL HIGH (ref 70–99)
Glucose-Capillary: 82 mg/dL (ref 70–99)
Glucose-Capillary: 90 mg/dL (ref 70–99)
Glucose-Capillary: 93 mg/dL (ref 70–99)

## 2018-06-20 LAB — COMPREHENSIVE METABOLIC PANEL
ALBUMIN: 2.7 g/dL — AB (ref 3.5–5.0)
ALT: 37 U/L (ref 0–44)
AST: 95 U/L — ABNORMAL HIGH (ref 15–41)
Alkaline Phosphatase: 196 U/L — ABNORMAL HIGH (ref 38–126)
Anion gap: 14 (ref 5–15)
BUN: 48 mg/dL — ABNORMAL HIGH (ref 6–20)
CO2: 23 mmol/L (ref 22–32)
Calcium: 8.3 mg/dL — ABNORMAL LOW (ref 8.9–10.3)
Chloride: 92 mmol/L — ABNORMAL LOW (ref 98–111)
Creatinine, Ser: 7.44 mg/dL — ABNORMAL HIGH (ref 0.61–1.24)
GFR calc Af Amer: 8 mL/min — ABNORMAL LOW (ref >60)
GFR calc non Af Amer: 7 mL/min — ABNORMAL LOW (ref >60)
GLUCOSE: 281 mg/dL — AB (ref 70–99)
Potassium: 3.7 mmol/L (ref 3.5–5.1)
Sodium: 129 mmol/L — ABNORMAL LOW (ref 135–145)
Total Bilirubin: 1.1 mg/dL (ref 0.3–1.2)
Total Protein: 6.6 g/dL (ref 6.5–8.1)

## 2018-06-20 LAB — CBC
HCT: 31.4 % — ABNORMAL LOW (ref 40.0–52.0)
Hemoglobin: 10.9 g/dL — ABNORMAL LOW (ref 13.0–18.0)
MCH: 33 pg (ref 26.0–34.0)
MCHC: 34.7 g/dL (ref 32.0–36.0)
MCV: 95.2 fL (ref 80.0–100.0)
Platelets: UNDETERMINED 10*3/uL (ref 150–440)
RBC: 3.3 MIL/uL — AB (ref 4.40–5.90)
RDW: 14 % (ref 11.5–14.5)
WBC: 4.2 10*3/uL (ref 3.8–10.6)

## 2018-06-20 LAB — HIV ANTIBODY (ROUTINE TESTING W REFLEX): HIV Screen 4th Generation wRfx: NONREACTIVE

## 2018-06-20 LAB — HEMOGLOBIN A1C
Hgb A1c MFr Bld: 9.2 % — ABNORMAL HIGH (ref 4.8–5.6)
MEAN PLASMA GLUCOSE: 217.34 mg/dL

## 2018-06-20 LAB — TROPONIN I: Troponin I: 0.04 ng/mL (ref <0.03)

## 2018-06-20 MED ORDER — SODIUM CHLORIDE 0.9 % IV SOLN
INTRAVENOUS | Status: DC
  Administered 2018-06-20: 2.4 [IU]/h via INTRAVENOUS
  Filled 2018-06-20: qty 1

## 2018-06-20 MED ORDER — SODIUM CHLORIDE 0.9 % IV SOLN
INTRAVENOUS | Status: DC
  Administered 2018-06-20: 23:00:00 via INTRAVENOUS

## 2018-06-20 MED ORDER — METOPROLOL TARTRATE 5 MG/5ML IV SOLN
5.0000 mg | Freq: Once | INTRAVENOUS | Status: AC
  Administered 2018-06-20: 5 mg via INTRAVENOUS

## 2018-06-20 MED ORDER — METOPROLOL TARTRATE 5 MG/5ML IV SOLN
INTRAVENOUS | Status: AC
  Administered 2018-06-20: 5 mg via INTRAVENOUS
  Filled 2018-06-20: qty 5

## 2018-06-20 MED ORDER — POTASSIUM CHLORIDE 10 MEQ/100ML IV SOLN
10.0000 meq | INTRAVENOUS | Status: AC
  Administered 2018-06-20 – 2018-06-21 (×2): 10 meq via INTRAVENOUS
  Filled 2018-06-20 (×2): qty 100

## 2018-06-20 MED ORDER — SODIUM CHLORIDE 0.9 % IV BOLUS
1000.0000 mL | Freq: Once | INTRAVENOUS | Status: AC
  Administered 2018-06-20: 1000 mL via INTRAVENOUS

## 2018-06-20 MED ORDER — SODIUM CHLORIDE 0.9 % IV SOLN: INTRAVENOUS | Status: AC

## 2018-06-20 MED ORDER — INSULIN ASPART 100 UNIT/ML ~~LOC~~ SOLN
3.0000 [IU] | Freq: Three times a day (TID) | SUBCUTANEOUS | Status: DC
  Administered 2018-06-20 (×3): 3 [IU] via SUBCUTANEOUS
  Filled 2018-06-20 (×2): qty 1

## 2018-06-20 MED ORDER — INSULIN ASPART 100 UNIT/ML ~~LOC~~ SOLN
0.0000 [IU] | Freq: Three times a day (TID) | SUBCUTANEOUS | Status: DC
  Administered 2018-06-20: 5 [IU] via SUBCUTANEOUS
  Administered 2018-06-20: 3 [IU] via SUBCUTANEOUS
  Filled 2018-06-20 (×3): qty 1

## 2018-06-20 MED ORDER — INSULIN ASPART 100 UNIT/ML ~~LOC~~ SOLN: 0.0000 [IU] | Freq: Every day | SUBCUTANEOUS | Status: DC

## 2018-06-20 MED ORDER — DEXTROSE-NACL 5-0.45 % IV SOLN
INTRAVENOUS | Status: DC
  Administered 2018-06-21: 05:00:00 via INTRAVENOUS

## 2018-06-20 MED ORDER — INSULIN DETEMIR 100 UNIT/ML ~~LOC~~ SOLN
17.0000 [IU] | Freq: Every day | SUBCUTANEOUS | Status: DC
  Administered 2018-06-20: 17 [IU] via SUBCUTANEOUS
  Filled 2018-06-20 (×2): qty 0.17

## 2018-06-20 NOTE — Progress Notes (Signed)
Follow up - Critical Care Medicine Note  Patient Details:    Steven [name] is an 57 y.o. male.with a past medical history remarkable for anemia, end-stage renal disease on hemodialysis Monday was a Friday, diabetes, hypertension, hyperlipidemia, peripheral neuropathy, sleep apnea,presented to the emergency department secondary to unable to access his fistula which had recently been declotted and stented. He was noted to have an elevated blood sugar Of 701 with an anion gap of 18. He was admitted to the intensive care unit, hemodialysis catheter was placed, patient received hemodialysis, insulin infusion and fluid resuscitation   Lines, Airways, Drains: Negative Pressure Wound Therapy Foot Right (Active)    Anti-infectives:  Anti-infectives (From admission, onward)   None      Microbiology: Results for orders placed or performed during the hospital encounter of 06/19/18  MRSA PCR Screening     Status: None   Collection Time: 06/19/18  4:07 PM  Result Value Ref Range Status   MRSA by PCR NEGATIVE NEGATIVE Final    Comment:        The GeneXpert MRSA Assay (FDA approved for NASAL specimens only), is one component of a comprehensive MRSA colonization surveillance program. It is not intended to diagnose MRSA infection nor to guide or monitor treatment for MRSA infections. Performed at [facility], [address] [place], [place] [address]    Studies: No results found.  Consults: Treatment Team:  Steven Legato, MD   Subjective:    Overnight Issues: Status post hemodialysis catheter, hemodialysis, insulin infusion, fluid resuscitation. Patient is feeling better this morning, less confused, started on an oral diet  Objective:  Vital signs for last 24 hours: Temp:  [97.9 F (36.6 C)-99.7 F (37.6 C)] 97.9 F (36.6 C) (09/13 0752) Pulse Rate:  [85-127] 123 (09/13 0900) Resp:  [12-28] 14 (09/13 0900) BP: (77-191)/(48-132) 123/82 (09/13 0900) SpO2:  [88  %-100 %] 94 % (09/13 0900) Weight:  [120.9 kg-124.1 kg] 120.9 kg (09/12 2309)  Hemodynamic parameters for last 24 hours:    Intake/Output from previous day: 09/12 0701 - 09/13 0700 In: 425.8 [I.V.:425.8] Out: 1942 [Urine:1025]  Intake/Output this shift: No intake/output data recorded.  Vent settings for last 24 hours:    Physical Exam:  Constitutional: He is oriented to person, place, and time. No distress.  HENT:  Head: Normocephalic.  Eyes: No scleral icterus.  Neck: Normal range of motion. Neck supple. No JVD present. No tracheal deviation present.  Cardiovascular: Normal rate, regular rhythm and normal heart sounds. Exam reveals no gallop and no friction rub.  No murmur heard. Pulmonary/Chest: Effort normal and breath sounds normal. No respiratory distress. He has no wheezes. He has no rales. He exhibits no tenderness.  Abdominal: Soft. Bowel sounds are normal. He exhibits no distension and no mass. There is no tenderness. There is no rebound and no guarding.  Musculoskeletal: Normal range of motion. He exhibits no edema.  Neurological: He is alert and oriented to person, place, and time.   Assessment/Plan:   End-stage renal disease, recent history of thrombosed right arm fistula, status post thrombectomy. Unable to receive hemodialysis yesterday secondary to clotted fistula. In the emergency room was found to be in DKA. Admitted to the intensive care unit where he received hemodialysis, insulin infusion, fluid resuscitation. This morning he is doing better, pending vascular surgery for further evaluation of right arm fistula  DKA has resolved. Blood sugar is 169 with an anion gap of 13  Hyponatremia has improved to 132 with  saline resuscitation and correction of blood sugars  Obesity  Untreated obstructive sleep apnea  History of EtOH use on CIWA protocol  If status remained stable may transfer to the floor   Steven [name] 06/20/2018  *Care during the described  time interval was provided by me and/or other providers on the critical care team.  I have reviewed this patient's available data, including medical history, events of note, physical examination and test results as part of my evaluation.

## 2018-06-20 NOTE — Progress Notes (Signed)
[facility]  Daily Progress Note   Subjective  - * No surgery found *  Patient states his access is working  Objective Vitals:   06/20/18 1600 06/20/18 1700 06/20/18 1720 06/20/18 1800  BP: 97/82 123/65 129/75 129/78  Pulse: (!) 125 (!) 118 (!) 116 (!) 117  Resp:  15 14 13   Temp:      TempSrc:      SpO2:  97% 97% 94%  Weight:      Height:        Intake/Output Summary (Last 24 hours) at 06/20/2018 1933 Last data filed at 06/20/2018 1509 Gross per 24 hour  Intake 905.84 ml  Output 1218 ml  Net -312.16 ml    PULM  Normal effort , no use of accessory muscles CV  No JVD, RRR Abd      No distended, nontender VASC  right forearm access with good thrill good bruit moderate amount of ecchymoses status post previous intervention Laboratory CBC    Component Value Date/Time   WBC 3.5 (L) 06/19/2018 1634   HGB 10.0 (L) 06/19/2018 1634   HCT 30.0 (L) 06/19/2018 1634   PLT 100 (L) 06/19/2018 1634    BMET    Component Value Date/Time   NA 132 (L) 06/20/2018 0100   K 3.6 06/20/2018 0100   CL 94 (L) 06/20/2018 0100   CO2 25 06/20/2018 0100   GLUCOSE 169 (H) 06/20/2018 0100   BUN 48 (H) 06/20/2018 0100   CREATININE 7.02 (H) 06/20/2018 0100   CREATININE 1.41 (H) 12/07/2011 1030   CALCIUM 8.4 (L) 06/20/2018 0100   GFRNONAA 8 (L) 06/20/2018 0100   GFRAA 9 (L) 06/20/2018 0100    Assessment/Planning:  Complication dialysis access:  I would recommend we continue to use the forearm access.  If successful the temporary catheter can be removed.    Hortencia Pilar  06/20/2018, 7:33 PM

## 2018-06-20 NOTE — Progress Notes (Signed)
[facility]  ROUNDING NOTE   Subjective:  Completed hemodialysis yesterday. In good spirits. Vascular consultation placed.     Objective:  Vital signs in last 24 hours:  Temp:  [97.9 F (36.6 C)-99.7 F (37.6 C)] 97.9 F (36.6 C) (09/13 0752) Pulse Rate:  [85-126] 103 (09/13 0700) Resp:  [12-28] 17 (09/13 0600) BP: (77-191)/(48-132) 141/89 (09/13 0700) SpO2:  [88 %-100 %] 100 % (09/13 0700) Weight:  [120.9 kg-124.1 kg] 120.9 kg (09/12 2309)  Weight change:  Filed Weights   06/19/18 1605 06/19/18 2039 06/19/18 2309  Weight: 123.1 kg 121.5 kg 120.9 kg    Intake/Output: I/O last 3 completed shifts: In: 425.8 [I.V.:425.8] Out: 1942 [Urine:1025; Other:917]   Intake/Output this shift:  No intake/output data recorded.  Physical Exam: General: No acute distress  Head: Normocephalic, atraumatic. Moist oral mucosal membranes  Eyes: Anicteric  Neck: Supple, trachea midline  Lungs:  Clear to auscultation, normal effort  Heart: S1S2 no rubs  Abdomen:  Soft, nontender, bowel sounds present  Extremities: Trace peripheral edema.  Neurologic: Awake, alert, following commands  Skin: No lesions  Access: Clotted RUE AVF    Basic Metabolic Panel: Recent Labs  Lab 06/19/18 1308 06/19/18 1634 06/19/18 2030 06/20/18 0100  NA 125* 127* 131* 132*  K 4.4 3.9 3.6 3.6  CL 85* 87* 90* 94*  CO2 22 24 26 25   GLUCOSE 701* 547* 232* 169*  BUN 67* 63* 64* 48*  CREATININE 8.80* 8.83* 8.79* 7.02*  CALCIUM 8.2* 8.4* 8.6* 8.4*  MG  --  1.6*  --   --   PHOS  --  2.2* 1.7*  --     Liver Function Tests: No results for input(s): AST, ALT, ALKPHOS, BILITOT, PROT, ALBUMIN in the last 168 hours. No results for input(s): LIPASE, AMYLASE in the last 168 hours. No results for input(s): AMMONIA in the last 168 hours.  CBC: Recent Labs  Lab 06/19/18 1308 06/19/18 1634  WBC 3.9 3.5*  NEUTROABS 3.2  --   HGB 10.2* 10.0*  HCT 30.7* 30.0*  MCV 98.0 98.0  PLT 102* 100*     Cardiac Enzymes: No results for input(s): CKTOTAL, CKMB, CKMBINDEX, TROPONINI in the last 168 hours.  BNP: Invalid input(s): POCBNP  CBG: Recent Labs  Lab 06/20/18 0147 06/20/18 0306 06/20/18 0411 06/20/18 0459 06/20/18 0732  GLUCAP 167* 93 82 90 119*    Microbiology: Results for orders placed or performed during the hospital encounter of 06/19/18  MRSA PCR Screening     Status: None   Collection Time: 06/19/18  4:07 PM  Result Value Ref Range Status   MRSA by PCR NEGATIVE NEGATIVE Final    Comment:        The GeneXpert MRSA Assay (FDA approved for NASAL specimens only), is one component of a comprehensive MRSA colonization surveillance program. It is not intended to diagnose MRSA infection nor to guide or monitor treatment for MRSA infections. Performed at [facility], [address] [place], [place] [address]     Coagulation Studies: No results for input(s): LABPROT, INR in the last 72 hours.  Urinalysis: Recent Labs    06/19/18 1609  COLORURINE YELLOW*  LABSPEC 1.008  PHURINE 7.0  GLUCOSEU >=500*  HGBUR SMALL*  BILIRUBINUR NEGATIVE  KETONESUR NEGATIVE  PROTEINUR 100*  NITRITE NEGATIVE  LEUKOCYTESUR NEGATIVE      Imaging: No results found.   Medications:   . sodium chloride    . sodium chloride     .  amitriptyline  25 mg Oral QHS  . aspirin EC  81 mg Oral Daily  . Chlorhexidine Gluconate Cloth  6 each Topical Q0600  . folic acid  1 mg Oral Daily  . gabapentin  300 mg Oral BID  . heparin injection (subcutaneous)  5,000 Units Subcutaneous Q8H  . Influenza vac split quadrivalent PF  0.5 mL Intramuscular Tomorrow-1000  . insulin aspart  0-5 Units Subcutaneous QHS  . insulin aspart  0-9 Units Subcutaneous TID WC  . insulin aspart  3 Units Subcutaneous TID WC  . insulin detemir  17 Units Subcutaneous Daily  . LORazepam  0-4 mg Oral Q6H   Followed by  . [START ON 06/21/2018] LORazepam  0-4 mg Oral Q12H  . losartan  25 mg Oral  Daily  . metoprolol tartrate  25 mg Oral BID  . multivitamin with minerals  1 tablet Oral Daily  . pantoprazole  40 mg Oral QODAY  . thiamine  100 mg Oral Daily   Or  . thiamine  100 mg Intravenous Daily   sodium chloride, sodium chloride, alteplase, heparin, lidocaine (PF), lidocaine-prilocaine, LORazepam **OR** LORazepam, pentafluoroprop-tetrafluoroeth  Assessment/ Plan:  57 y.o. male with hypertension, diabetes mellitus type 2 insulin dependent, hyperlipidemia, GERD, and morbid obesity  [facility]/MWF  1. 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 -Patient did not dialyze for 8 days prior to admission..  Temporary dialysis catheter being placed by critical care.  He had access intervention June 18, 2018 however access now clotted again.  -She completed hemodialysis yesterday.  We will plan for dialysis again today.  Orders have been prepared.  2. Hypertension:  Continue losartan and metoprolol.  3. Anemia of chronic kidney disease: Continue to monitor CBC.  Hold off on Epogen for now.  4. Secondary Hyperparathyroidism:  Await PTH result.  Phosphorus was a bit low at 1.7 yesterday.  Repeat this today.  5.  Hyponatremia.  Sodium up to 132.  Continue to monitor.   LOS: 1 Lachanda Buczek 9/13/20198:07 AM

## 2018-06-20 NOTE — Progress Notes (Signed)
Steven [name] NAME: Steven [name]    MR#:  [id number]  DATE OF BIRTH:  1960-11-04  SUBJECTIVE:  CHIEF COMPLAINT:   Chief Complaint  Patient presents with  . Vascular Access Problem   The patient feels better, eating lunch. REVIEW OF SYSTEMS:  Review of Systems  Constitutional: Positive for malaise/fatigue. Negative for chills and fever.  HENT: Negative for sore throat.   Eyes: Negative for blurred vision and double vision.  Respiratory: Negative for cough, hemoptysis, shortness of breath, wheezing and stridor.   Cardiovascular: Negative for chest pain, palpitations, orthopnea and leg swelling.  Gastrointestinal: Negative for abdominal pain, blood in stool, diarrhea, melena, nausea and vomiting.  Genitourinary: Negative for dysuria, flank pain and hematuria.  Musculoskeletal: Negative for back pain and joint pain.  Skin: Negative for rash.  Neurological: Negative for dizziness, sensory change, focal weakness, seizures, loss of consciousness, weakness and headaches.  Endo/Heme/Allergies: Negative for polydipsia.  Psychiatric/Behavioral: Negative for depression. The patient is not nervous/anxious.     DRUG ALLERGIES:   Allergies  Allergen Reactions  . Codeine Nausea And Vomiting   VITALS:  Blood pressure (!) 65/40, pulse (!) 125, temperature 97.9 F (36.6 C), temperature source Oral, resp. rate 19, height 5\' 10"  (1.778 m), weight 120.9 kg, SpO2 99 %. PHYSICAL EXAMINATION:  Physical Exam  Constitutional: He is oriented to person, place, and time.  Obesity.  HENT:  Head: Normocephalic.  Mouth/Throat: Oropharynx is clear and moist.  Eyes: Pupils are equal, round, and reactive to light. Conjunctivae and EOM are normal. No scleral icterus.  Neck: Normal range of motion. Neck supple. No JVD present. No tracheal deviation present.  Cardiovascular: Normal rate, regular rhythm and normal heart sounds. Exam reveals no gallop.    No murmur heard. Pulmonary/Chest: Effort normal and breath sounds normal. No respiratory distress. He has no wheezes. He has no rales.  Abdominal: Soft. Bowel sounds are normal. He exhibits no distension. There is no tenderness. There is no rebound.  Musculoskeletal: Normal range of motion. He exhibits no edema or tenderness.  Neurological: He is alert and oriented to person, place, and time. No cranial nerve deficit.  Skin: No rash noted. No erythema.  Psychiatric: He has a normal mood and affect.   LABORATORY PANEL:  Male CBC Recent Labs  Lab 06/19/18 1634  WBC 3.5*  HGB 10.0*  HCT 30.0*  PLT 100*   ------------------------------------------------------------------------------------------------------------------ Chemistries  Recent Labs  Lab 06/19/18 1634  06/20/18 0100  NA 127*   < > 132*  K 3.9   < > 3.6  CL 87*   < > 94*  CO2 24   < > 25  GLUCOSE 547*   < > 169*  BUN 63*   < > 48*  CREATININE 8.83*   < > 7.02*  CALCIUM 8.4*   < > 8.4*  MG 1.6*  --   --    < > = values in this interval not displayed.   RADIOLOGY:  No results found. ASSESSMENT AND PLAN:    57 year old male with uncontrolled diabetes and end-stage renal disease on hemodialysis who presents to the ER due to complication of AV access site and found to have DKA.  1.  DKA:  Improved with insulin drip.  Continue sliding scale, Levemir and NovoLog AC.  2.  Hyponatremia due to severe hyperglycemia.  Improving.  3.  End-stage renal disease on hemodialysis with complication of access site: Status post temporary cath.  Hemodialysis today per Dr. Holley Raring.  4.  EtOH dependence: CIWA protocol.  5.  Morbid obesity: Patient reports that he has lost weight.  Continue to encourage weight loss.  All the records are reviewed and case discussed with Care Management/Social Worker. Management plans discussed with the patient, family and they are in agreement.  CODE STATUS: Full Code  TOTAL TIME TAKING  CARE OF THIS PATIENT: 32 minutes.   More than 50% of the time was spent in counseling/coordination of care: YES  POSSIBLE D/C IN 2 DAYS, DEPENDING ON CLINICAL CONDITION.   Demetrios Loll M.D on 06/20/2018 at 1:33 PM  Between 7am to 6pm - Pager - [phone]  After 6pm go to [email or web address] - [facility]

## 2018-06-20 NOTE — Progress Notes (Signed)
Inpatient Diabetes Program Recommendations  AACE/ADA: New Consensus Statement on Inpatient Glycemic Control (2019)  Target Ranges:  Prepandial:   less than 140 mg/dL      Peak postprandial:   less than 180 mg/dL (1-2 hours)      Critically ill patients:  140 - 180 mg/dL   Results for MALACAI, GRANTZ (MRN [id number]) as of 06/20/2018 13:25  Ref. Range 06/20/2018 00:16 06/20/2018 01:47 06/20/2018 03:06 06/20/2018 04:11 06/20/2018 04:59 06/20/2018 07:32 06/20/2018 11:21 06/20/2018 13:14  Glucose-Capillary Latest Ref Range: 70 - 99 mg/dL 202 (H) 167 (H) 93 82 90 119 (H) 223 (H) 210 (H)  Results for KALEI, MCKILLOP (MRN [id number]) as of 06/20/2018 13:25  Ref. Range 06/19/2018 13:08  Glucose Latest Ref Range: 70 - 99 mg/dL [facility])  Results for GIANI, BETZOLD (MRN [id number]) as of 06/20/2018 13:25  Ref. Range 09/18/2017 15:12 06/19/2018 16:34  Hemoglobin A1C Latest Ref Range: 4.8 - 5.6 % 9.7 (H) 9.2 (H)   Review of Glycemic Control  Diabetes history: DM2 Outpatient Diabetes medications: Novolog 0-20 units TID based on glucose and meal, prescribed Levemir 25 units BID but pt reports rarely using Levemir Current orders for Inpatient glycemic control: Levemir 17 units daily, Novolog 3 units TID with meals, Novolog 0-9 units TID with meals, Novolog 0-5 units QHS  Inpatient Diabetes Program Recommendations:  Insulin-Meal Coverage: Please consider increasing meal coverage to Novolog 5 units TID with meals if patient is eating at least 50% of meals. A1C: A1C 9.2% on 06/19/18 indicating an average glucose of 217 mg/dl over the past 2-3 months. Patient admits that he rarely takes Levemir insulin and using large doses of Novolog with meals. At time of discharge, may want to discharge patient on an insulin regimen similar to being used an inpatient to help improve glycemic control.   NOTE: Spoke with patient about diabetes and home regimen for diabetes control. Patient reports that he is followed by PCP for  diabetes management and currently he takes Novolog 0-20 units TID with meals (per self-developed scale based on glucose) and may take a few more units of Novolog for meal is glucose is high prior to meal. Patient reports that he averages taking 10-20 units of Novolog at a time with each meal.  Inquired about Levemir and patient states that he has Levemir at home but he rarely uses it. Discussed initial glucose of 701 mg/dl on 06/19/18 and patient notes that he has not taken insulin for last 2 days due to procedure with dialysis catheter.  Patient reports that he checks his glucose 2 times per day and it is usually low 100's to upper 200's mg/dl.  Patient reports that he has lost 105 pounds over the past 1 year and he has not needed as much insulin since losing weight. Discussed Levemir and Novolog insulin in more detail (onset and duration) and explained that he would likely benefit from taking Levemir at least once a day which will keep his glucose from getting too high and requiring large doses of Novolog insulin.  Discussed glucose and A1C goals. Discussed importance of checking CBGs and maintaining good CBG control to prevent long-term and short-term complications.  Encouraged patient to continue checking his glucose 3-4 times per day (before meals and at bedtime) and to keep a log book of glucose readings and insulin taken which he will need to take to doctor appointments. Encouraged patient to take insulin as prescribed and to be sure his PCP knows what he is  actually taking. Patient verbalized understanding of information discussed and he states that he has no further questions at this time related to diabetes.  Thanks, Barnie Alderman, RN, MSN, CDE Diabetes Coordinator Inpatient Diabetes Program [phone] (Team Pager)

## 2018-06-20 NOTE — Progress Notes (Signed)
Pt called out saying that he felt like he was going to pass out. This RN went into the room and pt stated that he felt like his blood sugar was low. Tech checked CBG and CBG 223. When asked what kind of symptoms he was having, pt reported that his vision was going white. Denied any nausea, dizziness. Reported feeling like this for approximately 15-20 minutes prior to calling staff to the room. MD called, Dr. [place] Fuel to bedside to see pt. Orders received for labs, EKG. Will continue to monitor.

## 2018-06-20 NOTE — Progress Notes (Signed)
Pt asked Dr. Bridgett Larsson for nursing staff to check blood. This RN and Tami, RN went into room. Pt reported that he was having white vision again. CBG 210. Blood pressure checked, SBP 79, check in another area and systolic pressure 38. Color pale/gray, delayed cap refill. Pt answering questions appropriately, however seems to be more drowsy than prior to this event.  MD called, order received for 1L fluid bolus. Pt currently in recliner, feet put up, head laid back. Pt reports starting to feel better. Will continue to monitor closely.

## 2018-06-21 LAB — BASIC METABOLIC PANEL
ANION GAP: 9 (ref 5–15)
Anion gap: 14 (ref 5–15)
BUN: 50 mg/dL — ABNORMAL HIGH (ref 6–20)
BUN: 52 mg/dL — AB (ref 6–20)
CALCIUM: 8.3 mg/dL — AB (ref 8.9–10.3)
CHLORIDE: 95 mmol/L — AB (ref 98–111)
CO2: 24 mmol/L (ref 22–32)
CO2: 25 mmol/L (ref 22–32)
Calcium: 7.9 mg/dL — ABNORMAL LOW (ref 8.9–10.3)
Chloride: 95 mmol/L — ABNORMAL LOW (ref 98–111)
Creatinine, Ser: 7.73 mg/dL — ABNORMAL HIGH (ref 0.61–1.24)
Creatinine, Ser: 8.01 mg/dL — ABNORMAL HIGH (ref 0.61–1.24)
GFR calc Af Amer: 8 mL/min — ABNORMAL LOW (ref >60)
GFR calc Af Amer: 8 mL/min — ABNORMAL LOW (ref >60)
GFR, EST NON AFRICAN AMERICAN: 7 mL/min — AB (ref >60)
GFR, EST NON AFRICAN AMERICAN: 7 mL/min — AB (ref >60)
GLUCOSE: 236 mg/dL — AB (ref 70–99)
Glucose, Bld: 108 mg/dL — ABNORMAL HIGH (ref 70–99)
POTASSIUM: 3.3 mmol/L — AB (ref 3.5–5.1)
POTASSIUM: 3.7 mmol/L (ref 3.5–5.1)
SODIUM: 128 mmol/L — AB (ref 135–145)
SODIUM: 134 mmol/L — AB (ref 135–145)

## 2018-06-21 LAB — URINE CULTURE: Culture: NO GROWTH

## 2018-06-21 LAB — GLUCOSE, CAPILLARY
GLUCOSE-CAPILLARY: 105 mg/dL — AB (ref 70–99)
GLUCOSE-CAPILLARY: 161 mg/dL — AB (ref 70–99)
GLUCOSE-CAPILLARY: 272 mg/dL — AB (ref 70–99)
GLUCOSE-CAPILLARY: 298 mg/dL — AB (ref 70–99)
GLUCOSE-CAPILLARY: 308 mg/dL — AB (ref 70–99)
GLUCOSE-CAPILLARY: 85 mg/dL (ref 70–99)
GLUCOSE-CAPILLARY: 90 mg/dL (ref 70–99)
Glucose-Capillary: 201 mg/dL — ABNORMAL HIGH (ref 70–99)
Glucose-Capillary: 160 mg/dL — ABNORMAL HIGH (ref 70–99)
Glucose-Capillary: 95 mg/dL (ref 70–99)
Glucose-Capillary: 125 mg/dL — ABNORMAL HIGH (ref 70–99)
Glucose-Capillary: 96 mg/dL (ref 70–99)
Glucose-Capillary: 139 mg/dL — ABNORMAL HIGH (ref 70–99)
Glucose-Capillary: 147 mg/dL — ABNORMAL HIGH (ref 70–99)
Glucose-Capillary: 131 mg/dL — ABNORMAL HIGH (ref 70–99)
Glucose-Capillary: 108 mg/dL — ABNORMAL HIGH (ref 70–99)

## 2018-06-21 LAB — HEPATITIS B SURFACE ANTIGEN: Hepatitis B Surface Ag: NEGATIVE

## 2018-06-21 LAB — PHOSPHORUS: PHOSPHORUS: 1.9 mg/dL — AB (ref 2.5–4.6)

## 2018-06-21 LAB — PARATHYROID HORMONE, INTACT (NO CA): PTH: 192 pg/mL — AB (ref 15–65)

## 2018-06-21 MED ORDER — INSULIN DETEMIR 100 UNIT/ML ~~LOC~~ SOLN
20.0000 [IU] | Freq: Every day | SUBCUTANEOUS | Status: DC
  Administered 2018-06-21 – 2018-06-23 (×3): 20 [IU] via SUBCUTANEOUS
  Filled 2018-06-21 (×4): qty 0.2

## 2018-06-21 MED ORDER — INSULIN DETEMIR 100 UNIT/ML ~~LOC~~ SOLN
20.0000 [IU] | Freq: Once | SUBCUTANEOUS | Status: AC
  Administered 2018-06-21: 20 [IU] via SUBCUTANEOUS
  Filled 2018-06-21: qty 0.2

## 2018-06-21 MED ORDER — INSULIN ASPART 100 UNIT/ML ~~LOC~~ SOLN
0.0000 [IU] | Freq: Three times a day (TID) | SUBCUTANEOUS | Status: DC
  Administered 2018-06-21: 1 [IU] via SUBCUTANEOUS
  Administered 2018-06-23 (×2): 2 [IU] via SUBCUTANEOUS
  Administered 2018-06-24: 3 [IU] via SUBCUTANEOUS
  Administered 2018-06-24: 1 [IU] via SUBCUTANEOUS
  Filled 2018-06-21 (×5): qty 1

## 2018-06-21 MED ORDER — INSULIN ASPART 100 UNIT/ML ~~LOC~~ SOLN: 0.0000 [IU] | Freq: Every day | SUBCUTANEOUS | Status: DC

## 2018-06-21 MED ORDER — INSULIN ASPART 100 UNIT/ML ~~LOC~~ SOLN
6.0000 [IU] | Freq: Three times a day (TID) | SUBCUTANEOUS | Status: DC
  Administered 2018-06-21 – 2018-06-23 (×5): 6 [IU] via SUBCUTANEOUS
  Filled 2018-06-21 (×6): qty 1

## 2018-06-21 NOTE — Progress Notes (Signed)
Post HD assessment. Pt tolerated tx well without c/o or complication. Net UF 1512, goal met.    06/21/18 1530  Vital Signs  Temp 98.1 F (36.7 C)  Temp Source Oral  Pulse Rate 69  Pulse Rate Source Monitor  Resp 15  BP (!) 96/58  BP Location Left Arm  BP Method Automatic  Patient Position (if appropriate) Lying  Oxygen Therapy  SpO2 100 %  O2 Device Nasal Cannula  O2 Flow Rate (L/min) 2 L/min  Dialysis Weight  Weight 122.3 kg  Type of Weight Post-Dialysis  Post-Hemodialysis Assessment  Rinseback Volume (mL) 250 mL  KECN 48.4 V  Dialyzer Clearance Lightly streaked  Duration of HD Treatment -hour(s) 3 hour(s)  Hemodialysis Intake (mL) 500 mL  UF Total -Machine (mL) 2012 mL  Net UF (mL) 1512 mL  Tolerated HD Treatment Yes  Education / Care Plan  Dialysis Education Provided Yes  Documented Education in Care Plan Yes  Hemodialysis Catheter Right Femoral vein Temporary  Placement Date/Time: 06/19/18 1824   Placed prior to admission: No  Time Out: Correct patient;Correct site;Correct procedure  Maximum sterile barrier precautions: Hand hygiene;Cap;Mask;Sterile gown;Sterile gloves;Large sterile sheet  Site Prep: Chlorh...  Site Condition No complications  Blue Lumen Status Heparin locked  Red Lumen Status Heparin locked  Purple Lumen Status N/A  Catheter fill solution Heparin 1000 units/ml  Catheter fill volume (Arterial) 1.8 cc  Catheter fill volume (Venous) 1.8  Dressing Type Biopatch  Dressing Status Clean;Dry;Intact  Drainage Description None  Post treatment catheter status Capped and Clamped

## 2018-06-21 NOTE — Progress Notes (Signed)
Post HD assessment    06/21/18 1529  Neurological  Level of Consciousness Alert  Orientation Level Oriented X4  Respiratory  Respiratory Pattern Regular;Unlabored  Chest Assessment Chest expansion symmetrical  Cardiac  ECG Monitor Yes  Cardiac Rhythm NSR  Vascular  R Radial Pulse +2  L Radial Pulse +2  Edema Generalized;Right lower extremity;Left lower extremity  Integumentary  Integumentary (WDL) X  Skin Color Appropriate for ethnicity  Musculoskeletal  Musculoskeletal (WDL) X  Generalized Weakness Yes  Assistive Device None  GU Assessment  Genitourinary (WDL) X  Genitourinary Symptoms  (HD)  Psychosocial  Psychosocial (WDL) WDL

## 2018-06-21 NOTE — Progress Notes (Signed)
[facility]  ROUNDING NOTE   Subjective:  Patient seen at said. We plan to perform hemodialysis today using his right upper extremity AV fistula after evaluation by vascular surgery.   Objective:  Vital signs in last 24 hours:  Temp:  [97.8 F (36.6 C)-99 F (37.2 C)] 98 F (36.7 C) (09/14 0800) Pulse Rate:  [69-131] 71 (09/14 0900) Resp:  [11-32] 12 (09/14 0900) BP: (38-152)/(21-101) 121/83 (09/14 0900) SpO2:  [91 %-100 %] 100 % (09/14 0900)  Weight change:  Filed Weights   06/19/18 1605 06/19/18 2039 06/19/18 2309  Weight: 123.1 kg 121.5 kg 120.9 kg    Intake/Output: I/O last 3 completed shifts: In: 1444.2 [P.O.:480; I.V.:791.2; IV [id number]:038] Out: 8828 [Urine:600; Other:917; Stool:1]   Intake/Output this shift:  No intake/output data recorded.  Physical Exam: General: No acute distress  Head: Normocephalic, atraumatic. Moist oral mucosal membranes  Eyes: Anicteric  Neck: Supple, trachea midline  Lungs:  Clear to auscultation, normal effort  Heart: S1S2 no rubs  Abdomen:  Soft, nontender, bowel sounds present  Extremities: Trace peripheral edema.  Neurologic: Awake, alert, following commands  Skin: No lesions  Access: RUE AVF, femoral dialysis catheter    Basic Metabolic Panel: Recent Labs  Lab 06/19/18 1634 06/19/18 2030 06/20/18 0100 06/20/18 1908 06/20/18 2253 06/21/18 0252 06/21/18 0745 06/21/18 0748  NA 127* 131* 132* 129* 129* 128* 134*  --   K 3.9 3.6 3.6 3.7 3.9 3.3* 3.7  --   CL 87* 90* 94* 92* 93* 95* 95*  --   CO2 24 26 25 23 24 24 25   --   GLUCOSE 547* 232* 169* 281* 325* 236* 108*  --   BUN 63* 64* 48* 48* 50* 50* 52*  --   CREATININE 8.83* 8.79* 7.02* 7.44* 7.85* 7.73* 8.01*  --   CALCIUM 8.4* 8.6* 8.4* 8.3* 8.2* 7.9* 8.3*  --   MG 1.6*  --   --   --   --   --   --   --   PHOS 2.2* 1.7*  --   --   --   --   --  1.9*    Liver Function Tests: Recent Labs  Lab 06/20/18 1908  AST 95*  ALT 37  ALKPHOS 196*   BILITOT 1.1  PROT 6.6  ALBUMIN 2.7*   No results for input(s): LIPASE, AMYLASE in the last 168 hours. No results for input(s): AMMONIA in the last 168 hours.  CBC: Recent Labs  Lab 06/19/18 1308 06/19/18 1634 06/20/18 1908  WBC 3.9 3.5* 4.2  NEUTROABS 3.2  --   --   HGB 10.2* 10.0* 10.9*  HCT 30.7* 30.0* 31.4*  MCV 98.0 98.0 95.2  PLT 102* 100* PLATELET CLUMPS NOTED ON SMEAR, UNABLE TO ESTIMATE    Cardiac Enzymes: Recent Labs  Lab 06/20/18 1205  TROPONINI 0.04*    BNP: Invalid input(s): POCBNP  CBG: Recent Labs  Lab 06/21/18 0519 06/21/18 0619 06/21/18 0727 06/21/18 0832 06/21/18 0927  GLUCAP 90 85 95 96 125*    Microbiology: Results for orders placed or performed during the hospital encounter of 06/19/18  MRSA PCR Screening     Status: None   Collection Time: 06/19/18  4:07 PM  Result Value Ref Range Status   MRSA by PCR NEGATIVE NEGATIVE Final    Comment:        The GeneXpert MRSA Assay (FDA approved for NASAL specimens only), is one component of a comprehensive MRSA colonization surveillance program. It  is not intended to diagnose MRSA infection nor to guide or monitor treatment for MRSA infections. Performed at [facility], [address] [place], [place] [address]   Urine culture     Status: None   Collection Time: 06/19/18  4:09 PM  Result Value Ref Range Status   Specimen Description   Final    URINE, RANDOM Performed at [facility], [address], [place], [place] [address]    Special Requests   Final    NONE Performed at [facility], [address] [place], [place] [address]    Culture   Final    NO GROWTH Performed at [facility], [place] [address] [place], [place] [address]    Report Status 06/21/2018 FINAL  Final    Coagulation Studies: No results for input(s): LABPROT, INR in the last 72 hours.  Urinalysis: Recent Labs    06/19/18 1609  COLORURINE YELLOW*  LABSPEC 1.008   PHURINE 7.0  GLUCOSEU >=500*  HGBUR SMALL*  BILIRUBINUR NEGATIVE  KETONESUR NEGATIVE  PROTEINUR 100*  NITRITE NEGATIVE  LEUKOCYTESUR NEGATIVE      Imaging: No results found.   Medications:   . sodium chloride    . sodium chloride Stopped (06/21/18 0441)  . dextrose 5 % and 0.45% NaCl 50 mL/hr at 06/21/18 0600  . insulin (NOVOLIN-R) infusion 0.7 Units/hr (06/21/18 0730)   . amitriptyline  25 mg Oral QHS  . aspirin EC  81 mg Oral Daily  . Chlorhexidine Gluconate Cloth  6 each Topical Q0600  . folic acid  1 mg Oral Daily  . gabapentin  300 mg Oral BID  . heparin injection (subcutaneous)  5,000 Units Subcutaneous Q8H  . Influenza vac split quadrivalent PF  0.5 mL Intramuscular Tomorrow-1000  . insulin detemir  17 Units Subcutaneous Daily  . LORazepam  0-4 mg Oral Q6H   Followed by  . LORazepam  0-4 mg Oral Q12H  . losartan  25 mg Oral Daily  . metoprolol tartrate  25 mg Oral BID  . multivitamin with minerals  1 tablet Oral Daily  . pantoprazole  40 mg Oral QODAY  . thiamine  100 mg Oral Daily   Or  . thiamine  100 mg Intravenous Daily   sodium chloride, alteplase, heparin, lidocaine (PF), lidocaine-prilocaine, LORazepam **OR** LORazepam, pentafluoroprop-tetrafluoroeth  Assessment/ Plan:  57 y.o. male with hypertension, diabetes mellitus type 2 insulin dependent, hyperlipidemia, GERD, and morbid obesity  [facility]/MWF  1. 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 -Patient did not dialyze for 8 days prior to admission..  Temporary dialysis catheter being placed by critical care.  He had access intervention June 18, 2018 however access now clotted again.  -Vascular surgery has seen the patient.  They have recommended that we attempt to use the dialysis access.  We will attempt using the dialysis access again however if it is unsuccessful we will need to use the temporary dialysis  catheter again and then we may need to consider a PermCath next week.  2. Hypertension:  Continue losartan and metoprolol.  3. Anemia of chronic kidney disease: Hemoglobin 10.9 at the moment.  Hold off on Epogen.  4. Secondary Hyperparathyroidism:  Phosphorus remains low at 1.9.  Should improve as the patient starts eating again.  5.  Hyponatremia.  Sodium currently 134, will monitor.    LOS: 2 Steven [name] 9/14/201910:21 AM

## 2018-06-21 NOTE — Progress Notes (Signed)
Follow up - Critical Care Medicine Note  Patient Details:    Steven [name] is an 57 y.o. male.with a past medical history remarkable for anemia, end-stage renal disease on hemodialysis Monday was a Friday, diabetes, hypertension, hyperlipidemia, peripheral neuropathy, sleep apnea,presented to the emergency department secondary to unable to access his fistula which had recently been declotted and stented. He was noted to have an elevated blood sugar Of 701 with an anion gap of 18. He was admitted to the intensive care unit, hemodialysis catheter was placed, patient received hemodialysis, insulin infusion and fluid resuscitation   Lines, Airways, Drains: Negative Pressure Wound Therapy Foot Right (Active)    Anti-infectives:  Anti-infectives (From admission, onward)   None      Microbiology: Results for orders placed or performed during the hospital encounter of 06/19/18  MRSA PCR Screening     Status: None   Collection Time: 06/19/18  4:07 PM  Result Value Ref Range Status   MRSA by PCR NEGATIVE NEGATIVE Final    Comment:        The GeneXpert MRSA Assay (FDA approved for NASAL specimens only), is one component of a comprehensive MRSA colonization surveillance program. It is not intended to diagnose MRSA infection nor to guide or monitor treatment for MRSA infections. Performed at [facility], [address], [place], [place] [address]   Urine culture     Status: None   Collection Time: 06/19/18  4:09 PM  Result Value Ref Range Status   Specimen Description   Final    URINE, RANDOM Performed at [facility], [address], [place], [place] [address]    Special Requests   Final    NONE Performed at [facility], [address] [place], [place] [address]    Culture   Final    NO GROWTH Performed at [facility], [place] [address] [place], [place] [address]    Report Status 06/21/2018 FINAL  Final   Studies: No results  found.  Consults: Treatment Team:  Anthonette Legato, MD Schnier, Dolores Lory, MD   Subjective:    Overnight Issues: patient was kept in ICU overnight with worsening DKA, transient fluctuations in blood pressure and symptoms  Objective:  Vital signs for last 24 hours: Temp:  [97.8 F (36.6 C)-99 F (37.2 C)] 98.3 F (36.8 C) (09/14 0200) Pulse Rate:  [69-131] 70 (09/14 0750) Resp:  [12-32] 21 (09/14 0750) BP: (38-152)/(21-101) 128/79 (09/14 0750) SpO2:  [91 %-100 %] 100 % (09/14 0750)  Hemodynamic parameters for last 24 hours:    Intake/Output from previous day: 09/13 0701 - 09/14 0700 In: 1018.4 [P.O.:480; I.V.:365.3; IV Piggyback:173] Out: 301 [Urine:300; Stool:1]  Intake/Output this shift: No intake/output data recorded.  Vent settings for last 24 hours:    Physical Exam:  Constitutional: He is oriented to person, place, and time. No distress.  HENT:  Head: Normocephalic.  Eyes: No scleral icterus.  Neck: Normal range of motion. Neck supple. No JVD present. No tracheal deviation present.  Cardiovascular: Normal rate, regular rhythm and normal heart sounds. Exam reveals no gallop and no friction rub.  No murmur heard. Pulmonary/Chest: Effort normal and breath sounds normal. No respiratory distress. He has no wheezes. He has no rales. He exhibits no tenderness.  Abdominal: Soft. Bowel sounds are normal. He exhibits no distension and no mass. There is no tenderness. There is no rebound and no guarding.  Musculoskeletal: Normal range of motion. He exhibits no edema.  Neurological:  He is alert and oriented to person, place, and time.   Assessment/Plan:   End-stage renal disease, recent history of thrombosed right arm fistula, status post thrombectomy. Unable to receive hemodialysis yesterday secondary to clotted fistula. In the emergency room was found to be in DKA. Admitted to the intensive care unit where he received hemodialysis, insulin infusion, fluid resuscitation.  Was seen by vascular surgery who recommends trying forearm access  DKA Patient was restarted on insulin infusion yesterday, appreciate diabetes coordinator will follow all recommendations  Hyponatremia has improved to 134   Obesity  Untreated obstructive sleep apnea  History of EtOH use on CIWA protocol   Steven [name] 06/21/2018  *Care during the described time interval was provided by me and/or other providers on the critical care team.  I have reviewed this patient's available data, including medical history, events of note, physical examination and test results as part of my evaluation. Patient ID: Steven [name], male   DOB: 07-04-1961, 57 y.o.   MRN: [id number]

## 2018-06-21 NOTE — Progress Notes (Signed)
Inpatient Diabetes Program Recommendations  AACE/ADA: New Consensus Statement on Inpatient Glycemic Control (2015)  Target Ranges:  Prepandial:   less than 140 mg/dL      Peak postprandial:   less than 180 mg/dL (1-2 hours)      Critically ill patients:  140 - 180 mg/dL   Lab Results  Component Value Date   GLUCAP 96 06/21/2018   HGBA1C 9.2 (H) 06/19/2018    Review of Glycemic Control Results for Steven [name], Steven [name] (MRN [id number]) as of 06/21/2018 08:56  Ref. Range 06/21/2018 05:19 06/21/2018 06:19 06/21/2018 07:27 06/21/2018 08:32  Glucose-Capillary Latest Ref Range: 70 - 99 mg/dL 90 85 95 96   Diabetes history: DM2 Outpatient Diabetes medications: Novolog 0-20 units TID based on glucose and meal, prescribed Levemir 25 units BID but pt reports rarely using Levemir Current orders for Inpatient glycemic control: IV insulin  Inpatient Diabetes Program Recommendations:    Noted patient was restarted on insulin drip due to increasing blood sugars per order set .  When ready to transition consider:   -Administering Levemir 20 units 2 hours prior to discontinuation of insulin drip, then Q24H following.   -Novolog 6 units TID (assuming that patient is consuming >50% of meals)  -Novolog 0-9 units TID and Novolog 0-5 units QHS.  Will continue to follow.   Thanks, Bronson Curb, MSN, RNC-OB Diabetes Coordinator [phone] (8a-5p)

## 2018-06-21 NOTE — Progress Notes (Signed)
Pt's R-AVF HD access has a + bruit/ faint thrill. MD requested that we attempt to access and use AVF for HD tx. I was able to access pt's arterial site, but flow was extremely slow and clots were present. Pt was de-accessed and his R- femoral temp cath was used for HD tx today, MD aware.    06/21/18 1215  Vital Signs  Pulse Rate 77  Resp (!) 21  Oxygen Therapy  SpO2 100 %  Fistula / Graft Right Forearm Arteriovenous fistula  Placement Date/Time: 06/14/17 1230   Orientation: Right  Access Location: Forearm  Access Type: Arteriovenous fistula  Site Condition Other (Comment) (Access clotted)  Fistula / Graft Assessment Present;Bruit (faint thrill )  Status Deaccessed  Drainage Description None

## 2018-06-21 NOTE — Progress Notes (Signed)
HD tx end    06/21/18 1523  Vital Signs  Pulse Rate 73  Pulse Rate Source Monitor  Resp 16  BP 100/65  BP Location Left Arm  BP Method Automatic  Patient Position (if appropriate) Lying  Oxygen Therapy  SpO2 99 %  O2 Device Nasal Cannula  O2 Flow Rate (L/min) 2 L/min  During Hemodialysis Assessment  Dialysis Fluid Bolus Normal Saline  Bolus Amount (mL) 250 mL  Intra-Hemodialysis Comments Tx completed

## 2018-06-21 NOTE — Progress Notes (Signed)
Pt has remained alert and oriented with no c/o pain. Pt has remained on 2LNC. Pt does appear to have sleep apnea->O2 removed this afternoon and pt tolerated RA; however s/p hemodialysis -> pt danged on the edge of the bed and became dizzy and almost "blacked out" per pt-> BP soft -sys in the upper 80, MAP appropriate -> 2LNC reapplied for cardiac support. Pt has remained in NSR.  RIGHT arm fistula has a positive bruit, but an absent thrill upon assessment this am. HD did try to access but it appears clotted -Dr Holley Raring made aware. Pt transitioned off insulin gtt around 1400-cbg 108 for dinner; however pt did not consume much, about 25%->all evening insulin was HELD. Family visited and updated at bedside.

## 2018-06-21 NOTE — Progress Notes (Signed)
[place] [facility] [place] NAME: Steven [name]    MR#:  [id number]  DATE OF BIRTH:  1961/06/08  SUBJECTIVE:  CHIEF COMPLAINT:   Chief Complaint  Patient presents with  . Vascular Access Problem  No events overnight, intensivist/nephrology/vascular input appreciated  REVIEW OF SYSTEMS:  CONSTITUTIONAL: No fever, fatigue or weakness.  EYES: No blurred or double vision.  EARS, NOSE, AND THROAT: No tinnitus or ear pain.  RESPIRATORY: No cough, shortness of breath, wheezing or hemoptysis.  CARDIOVASCULAR: No chest pain, orthopnea, edema.  GASTROINTESTINAL: No nausea, vomiting, diarrhea or abdominal pain.  GENITOURINARY: No dysuria, hematuria.  ENDOCRINE: No polyuria, nocturia,  HEMATOLOGY: No anemia, easy bruising or bleeding SKIN: No rash or lesion. MUSCULOSKELETAL: No joint pain or arthritis.   NEUROLOGIC: No tingling, numbness, weakness.  PSYCHIATRY: No anxiety or depression.   ROS  DRUG ALLERGIES:   Allergies  Allergen Reactions  . Codeine Nausea And Vomiting    VITALS:  Blood pressure (!) 145/77, pulse 72, temperature 98 F (36.7 C), temperature source Oral, resp. rate 16, height 5\' 10"  (1.778 m), weight 120.9 kg, SpO2 100 %.  PHYSICAL EXAMINATION:  GENERAL:  57 y.o.-year-old patient lying in the bed with no acute distress.  EYES: Pupils equal, round, reactive to light and accommodation. No scleral icterus. Extraocular muscles intact.  HEENT: Head atraumatic, normocephalic. Oropharynx and nasopharynx clear.  NECK:  Supple, no jugular venous distention. No thyroid enlargement, no tenderness.  LUNGS: Normal breath sounds bilaterally, no wheezing, rales,rhonchi or crepitation. No use of accessory muscles of respiration.  CARDIOVASCULAR: S1, S2 normal. No murmurs, rubs, or gallops.  ABDOMEN: Soft, nontender, nondistended. Bowel sounds present. No organomegaly or mass.  EXTREMITIES: No pedal edema, cyanosis, or clubbing.  NEUROLOGIC:  Cranial nerves II through XII are intact. Muscle strength 5/5 in all extremities. Sensation intact. Gait not checked.  PSYCHIATRIC: The patient is alert and oriented x 3.  SKIN: No obvious rash, lesion, or ulcer.   Physical Exam LABORATORY PANEL:   CBC Recent Labs  Lab 06/20/18 1908  WBC 4.2  HGB 10.9*  HCT 31.4*  PLT PLATELET CLUMPS NOTED ON SMEAR, UNABLE TO ESTIMATE   ------------------------------------------------------------------------------------------------------------------  Chemistries  Recent Labs  Lab 06/19/18 1634  06/20/18 1908  06/21/18 0745  NA 127*   < > 129*   < > 134*  K 3.9   < > 3.7   < > 3.7  CL 87*   < > 92*   < > 95*  CO2 24   < > 23   < > 25  GLUCOSE 547*   < > 281*   < > 108*  BUN 63*   < > 48*   < > 52*  CREATININE 8.83*   < > 7.44*   < > 8.01*  CALCIUM 8.4*   < > 8.3*   < > 8.3*  MG 1.6*  --   --   --   --   AST  --   --  95*  --   --   ALT  --   --  37  --   --   ALKPHOS  --   --  196*  --   --   BILITOT  --   --  1.1  --   --    < > = values in this interval not displayed.   ------------------------------------------------------------------------------------------------------------------  Cardiac Enzymes Recent Labs  Lab 06/20/18 1205  TROPONINI 0.04*   ------------------------------------------------------------------------------------------------------------------  RADIOLOGY:  No results found.  ASSESSMENT AND PLAN:  57 year old male with uncontrolled diabetes and end-stage renal disease on hemodialysis who presents to the ER due to complication of AV access site and found to have DKA.  *Acute DKA Treated on our DKA protocol  *Acute dysfunctional AVF Vascular and nephrology input greatly appreciated For attempted hemodialysis via arterial venous fistula later today  *Acute hyponatremia due to severe hyperglycemia Resolved  *Chronic end-stage renal disease on hemodialysis Plan of care as stated above Nephrology  following for hemodialysis needs  *Alcohol dependency Continue alcohol withdrawal protocol  *Chronic morbid obesity Lifestyle modification recommended  All the records are reviewed and case discussed with Care Management/Social Workerr. Management plans discussed with the patient, family and they are in agreement.  CODE STATUS: full  TOTAL TIME TAKING CARE OF THIS PATIENT: 40 minutes.     POSSIBLE D/C IN 2-5 DAYS, DEPENDING ON CLINICAL CONDITION.   Steven [name] M.D on 06/21/2018   Between 7am to 6pm - Pager - [phone]  After 6pm go to [email or web address] - password [email or web address] [facility]  Office  [phone]  CC: Primary care physician; [name], MD  Note: This dictation was prepared with Dragon dictation along with smaller phrase technology. Any transcriptional errors that result from this process are unintentional.

## 2018-06-21 NOTE — Progress Notes (Signed)
HD tx start    06/21/18 1216  Vital Signs  Pulse Rate 74  Pulse Rate Source Monitor  Resp 16  BP (!) 141/109  BP Location Left Arm  BP Method Automatic  Patient Position (if appropriate) Lying  Oxygen Therapy  SpO2 100 %  O2 Device Nasal Cannula  O2 Flow Rate (L/min) 2 L/min  During Hemodialysis Assessment  Blood Flow Rate (mL/min) 300 mL/min  Arterial Pressure (mmHg) -220 mmHg  Venous Pressure (mmHg) 140 mmHg  Transmembrane Pressure (mmHg) 70 mmHg  Ultrafiltration Rate (mL/min) 650 mL/min  Dialysate Flow Rate (mL/min) 600 ml/min  Conductivity: Machine  13.7  HD Safety Checks Performed Yes  Dialysis Fluid Bolus Normal Saline  Bolus Amount (mL) 250 mL  Intra-Hemodialysis Comments Tx initiated  Hemodialysis Catheter Right Femoral vein Temporary  Placement Date/Time: 06/19/18 1824   Placed prior to admission: No  Time Out: Correct patient;Correct site;Correct procedure  Maximum sterile barrier precautions: Hand hygiene;Cap;Mask;Sterile gown;Sterile gloves;Large sterile sheet  Site Prep: Chlorh...  Blue Lumen Status Infusing  Red Lumen Status Infusing

## 2018-06-21 NOTE — Progress Notes (Signed)
Pre HD assessment   06/21/18 1200  Vital Signs  Temp 98 F (36.7 C)  Temp Source Oral  Pulse Rate 72  Pulse Rate Source Monitor  Resp 16  BP (!) 145/77  BP Location Left Arm  BP Method Automatic  Patient Position (if appropriate) Lying  Oxygen Therapy  SpO2 100 %  O2 Device Nasal Cannula  O2 Flow Rate (L/min) 2 L/min  Pain Assessment  Pain Scale 0-10  Pain Score 0  Dialysis Weight  Weight 123.8 kg  Type of Weight Pre-Dialysis  Time-Out for Hemodialysis  What Procedure? HD  Pt Identifiers(min of two) First/Last Name;MRN/Account#  Correct Site? Yes  Correct Side? Yes  Correct Procedure? Yes  Consents Verified? Yes  Rad Studies Available? N/A  Safety Precautions Reviewed? Yes  Engineer, civil (consulting) Number  (3A)  Station Number  (bedside ICU 16)  UF/Alarm Test Passed  Conductivity: Meter 13.8  Conductivity: Machine  13.7  pH 7.6  Reverse Osmosis  (WRO #1)  Normal Saline Lot Number [id number]  Dialyzer Lot Number [id number]  Disposable Set Lot Number [id number]  Machine Temperature 98.6 F (37 C)  Musician and Audible Yes  Blood Lines Intact and Secured Yes  Pre Treatment Patient Checks  Vascular access used during treatment Fistula  Hepatitis B Surface Antigen Results Negative  Date Hepatitis B Surface Antigen Drawn 06/19/18  Hepatitis B Surface Antibody  (>10)  Date Hepatitis B Surface Antibody Drawn 11/22/17  Hemodialysis Consent Verified Yes  Hemodialysis Standing Orders Initiated Yes  ECG (Telemetry) Monitor On Yes  Prime Ordered Normal Saline  Length of  DialysisTreatment -hour(s) 3 Hour(s)  Dialyzer Elisio 17H NR  Dialysate 2K, 2.5 Ca  Dialysis Anticoagulant None  Dialysate Flow Ordered 600  Blood Flow Rate Ordered 300 mL/min  Ultrafiltration Goal 1.5 Liters  Pre Treatment Labs Phosphorus (BMP)  Dialysis Blood Pressure Support Ordered Normal Saline  Education / Care Plan  Dialysis Education Provided Yes  Documented Education in Care Plan  Yes  Fistula / Graft Right Forearm Arteriovenous fistula  Placement Date/Time: 06/14/17 1230   Orientation: Right  Access Location: Forearm  Access Type: Arteriovenous fistula  Site Condition No complications  Fistula / Graft Assessment Present;Bruit (faint thrill )  Drainage Description None  Hemodialysis Catheter Right Femoral vein Temporary  Placement Date/Time: 06/19/18 1824   Placed prior to admission: No  Time Out: Correct patient;Correct site;Correct procedure  Maximum sterile barrier precautions: Hand hygiene;Cap;Mask;Sterile gown;Sterile gloves;Large sterile sheet  Site Prep: Chlorh...  Site Condition No complications  Blue Lumen Status Heparin locked  Red Lumen Status Heparin locked  Purple Lumen Status N/A  Dressing Type Biopatch  Dressing Status Clean;Dry;Intact  Drainage Description None

## 2018-06-21 NOTE — Progress Notes (Signed)
Pre HD assessment    06/21/18 1201  Neurological  Level of Consciousness Alert  Orientation Level Oriented X4  Respiratory  Respiratory Pattern Regular;Unlabored  Chest Assessment Chest expansion symmetrical  Cardiac  ECG Monitor Yes  Cardiac Rhythm NSR  Vascular  R Radial Pulse +2  L Radial Pulse +2  Edema Generalized;Right lower extremity;Left lower extremity  Integumentary  Integumentary (WDL) X  Skin Color Appropriate for ethnicity  Musculoskeletal  Musculoskeletal (WDL) X  Generalized Weakness Yes  Assistive Device None  GU Assessment  Genitourinary (WDL) X  Genitourinary Symptoms  (HD)  Psychosocial  Psychosocial (WDL) WDL

## 2018-06-22 LAB — BASIC METABOLIC PANEL
ANION GAP: 13 (ref 5–15)
BUN: 28 mg/dL — ABNORMAL HIGH (ref 6–20)
CO2: 28 mmol/L (ref 22–32)
Calcium: 8.5 mg/dL — ABNORMAL LOW (ref 8.9–10.3)
Chloride: 93 mmol/L — ABNORMAL LOW (ref 98–111)
Creatinine, Ser: 5.9 mg/dL — ABNORMAL HIGH (ref 0.61–1.24)
GFR, EST AFRICAN AMERICAN: 11 mL/min — AB (ref >60)
GFR, EST NON AFRICAN AMERICAN: 10 mL/min — AB (ref >60)
GLUCOSE: 115 mg/dL — AB (ref 70–99)
POTASSIUM: 3.6 mmol/L (ref 3.5–5.1)
Sodium: 134 mmol/L — ABNORMAL LOW (ref 135–145)

## 2018-06-22 LAB — GLUCOSE, CAPILLARY
GLUCOSE-CAPILLARY: 97 mg/dL (ref 70–99)
GLUCOSE-CAPILLARY: 116 mg/dL — AB (ref 70–99)
Glucose-Capillary: 97 mg/dL (ref 70–99)
Glucose-Capillary: 105 mg/dL — ABNORMAL HIGH (ref 70–99)

## 2018-06-22 LAB — PHOSPHORUS: PHOSPHORUS: 2.3 mg/dL — AB (ref 2.5–4.6)

## 2018-06-22 LAB — MAGNESIUM: MAGNESIUM: 1.8 mg/dL (ref 1.7–2.4)

## 2018-06-22 MED ORDER — LORAZEPAM 1 MG PO TABS: 1.0000 mg | ORAL_TABLET | Freq: Four times a day (QID) | ORAL | Status: DC | PRN

## 2018-06-22 MED ORDER — THIAMINE HCL 100 MG/ML IJ SOLN
100.0000 mg | Freq: Every day | INTRAMUSCULAR | Status: DC
  Administered 2018-06-22 – 2018-06-24 (×2): 100 mg via INTRAVENOUS
  Filled 2018-06-22 (×2): qty 2

## 2018-06-22 MED ORDER — PNEUMOCOCCAL VAC POLYVALENT 25 MCG/0.5ML IJ INJ: 0.5000 mL | INJECTION | INTRAMUSCULAR | Status: DC

## 2018-06-22 MED ORDER — LORAZEPAM 2 MG PO TABS: 0.0000 mg | ORAL_TABLET | Freq: Four times a day (QID) | ORAL | Status: AC

## 2018-06-22 MED ORDER — FOLIC ACID 1 MG PO TABS
1.0000 mg | ORAL_TABLET | Freq: Every day | ORAL | Status: DC
  Administered 2018-06-22 – 2018-06-24 (×2): 1 mg via ORAL
  Filled 2018-06-22 (×2): qty 1

## 2018-06-22 MED ORDER — LORAZEPAM 2 MG PO TABS
0.0000 mg | ORAL_TABLET | Freq: Two times a day (BID) | ORAL | Status: DC
  Administered 2018-06-23: 2 mg via ORAL
  Filled 2018-06-22: qty 1

## 2018-06-22 MED ORDER — ADULT MULTIVITAMIN W/MINERALS CH
1.0000 | ORAL_TABLET | Freq: Every day | ORAL | Status: DC
  Administered 2018-06-22 – 2018-06-24 (×2): 1 via ORAL
  Filled 2018-06-22 (×2): qty 1

## 2018-06-22 MED ORDER — LORAZEPAM 2 MG/ML IJ SOLN: 1.0000 mg | Freq: Four times a day (QID) | INTRAMUSCULAR | Status: DC | PRN

## 2018-06-22 NOTE — Progress Notes (Signed)
Spoke with patient about bipap.  Patient states he has lost over almost 100 pounds recently and his sleeping habits has gotten better.  He states he does not tolerate a mask of any kind.  At best, nasal pillows work but he tends to open his mouth.  Patient has declined bipap therapy.  Patient will have RN to call RT if he should change his mind.

## 2018-06-22 NOTE — Progress Notes (Signed)
[place] at [place] NAME: Steven [name]    MR#:  [id number]  DATE OF BIRTH:  01-12-1961  SUBJECTIVE:  CHIEF COMPLAINT:   Chief Complaint  Patient presents with  . Vascular Access Problem  Right AV fistula remains dysfunctional, vascular surgery to see in the morning, no events overnight, patient without complaint  REVIEW OF SYSTEMS:  CONSTITUTIONAL: No fever, fatigue or weakness.  EYES: No blurred or double vision.  EARS, NOSE, AND THROAT: No tinnitus or ear pain.  RESPIRATORY: No cough, shortness of breath, wheezing or hemoptysis.  CARDIOVASCULAR: No chest pain, orthopnea, edema.  GASTROINTESTINAL: No nausea, vomiting, diarrhea or abdominal pain.  GENITOURINARY: No dysuria, hematuria.  ENDOCRINE: No polyuria, nocturia,  HEMATOLOGY: No anemia, easy bruising or bleeding SKIN: No rash or lesion. MUSCULOSKELETAL: No joint pain or arthritis.   NEUROLOGIC: No tingling, numbness, weakness.  PSYCHIATRY: No anxiety or depression.   ROS  DRUG ALLERGIES:   Allergies  Allergen Reactions  . Codeine Nausea And Vomiting    VITALS:  Blood pressure 112/74, pulse 77, temperature 98.2 F (36.8 C), temperature source Oral, resp. rate 20, height 5\' 10"  (1.778 m), weight 122.3 kg, SpO2 100 %.  PHYSICAL EXAMINATION:  GENERAL:  57 y.o.-year-old patient lying in the bed with no acute distress.  EYES: Pupils equal, round, reactive to light and accommodation. No scleral icterus. Extraocular muscles intact.  HEENT: Head atraumatic, normocephalic. Oropharynx and nasopharynx clear.  NECK:  Supple, no jugular venous distention. No thyroid enlargement, no tenderness.  LUNGS: Normal breath sounds bilaterally, no wheezing, rales,rhonchi or crepitation. No use of accessory muscles of respiration.  CARDIOVASCULAR: S1, S2 normal. No murmurs, rubs, or gallops.  ABDOMEN: Soft, nontender, nondistended. Bowel sounds present. No organomegaly or mass.  EXTREMITIES:  No pedal edema, cyanosis, or clubbing.  NEUROLOGIC: Cranial nerves II through XII are intact. Muscle strength 5/5 in all extremities. Sensation intact. Gait not checked.  PSYCHIATRIC: The patient is alert and oriented x 3.  SKIN: No obvious rash, lesion, or ulcer.   Physical Exam LABORATORY PANEL:   CBC Recent Labs  Lab 06/20/18 1908  WBC 4.2  HGB 10.9*  HCT 31.4*  PLT PLATELET CLUMPS NOTED ON SMEAR, UNABLE TO ESTIMATE   ------------------------------------------------------------------------------------------------------------------  Chemistries  Recent Labs  Lab 06/20/18 1908  06/22/18 0603  NA 129*   < > 134*  K 3.7   < > 3.6  CL 92*   < > 93*  CO2 23   < > 28  GLUCOSE 281*   < > 115*  BUN 48*   < > 28*  CREATININE 7.44*   < > 5.90*  CALCIUM 8.3*   < > 8.5*  MG  --   --  1.8  AST 95*  --   --   ALT 37  --   --   ALKPHOS 196*  --   --   BILITOT 1.1  --   --    < > = values in this interval not displayed.   ------------------------------------------------------------------------------------------------------------------  Cardiac Enzymes Recent Labs  Lab 06/20/18 1205  TROPONINI 0.04*   ------------------------------------------------------------------------------------------------------------------  RADIOLOGY:  No results found.  ASSESSMENT AND PLAN:  57 year old male with uncontrolled diabetes and end-stage renal disease on hemodialysis who presents to the ER due to complication of AV access site and found to have DKA.  *Acute dysfunctional rt AVF Vascular to evaluate in the morning for possible repair  Nephrology following for hemodialysis needs   *Acute  DKA Resolved Treated on our DKA protocol  *Acute hyponatremia due to severe hyperglycemia Resolved  *Chronic end-stage renal disease on hemodialysis Plan of care as stated above Nephrology following for hemodialysis needs  *Alcohol dependency Continue alcohol withdrawal  protocol  *Chronic morbid obesity Lifestyle modification recommended  Disposition pending clinical course  All the records are reviewed and case discussed with Care Management/Social Workerr. Management plans discussed with the patient, family and they are in agreement.  CODE STATUS: full  TOTAL TIME TAKING CARE OF THIS PATIENT: 40 minutes.     POSSIBLE D/C IN 1-3 DAYS, DEPENDING ON CLINICAL CONDITION.   Steven [name] M.D on 06/22/2018   Between 7am to 6pm - Pager - [phone]  After 6pm go to [email or web address] - password [email or web address] [facility]  Office  [phone]  CC: Primary care physician; Steven Burly, MD  Note: This dictation was prepared with Dragon dictation along with smaller phrase technology. Any transcriptional errors that result from this process are unintentional.

## 2018-06-22 NOTE — Progress Notes (Signed)
[facility]  ROUNDING NOTE   Subjective:  Patient moved to floor care. We attempted to cannulate his right upper extremity AV fistula but this was unsuccessful. He will need either repeat fistulogram tomorrow or PermCath placement.   Objective:  Vital signs in last 24 hours:  Temp:  [98 F (36.7 C)-98.5 F (36.9 C)] 98.2 F (36.8 C) (09/15 0300) Pulse Rate:  [60-84] 77 (09/15 0300) Resp:  [10-20] 20 (09/15 0300) BP: (87-137)/(51-91) 112/74 (09/15 0300) SpO2:  [69 %-100 %] 100 % (09/15 0300) Weight:  [122.3 kg] 122.3 kg (09/14 1530)  Weight change:  Filed Weights   06/19/18 2309 06/21/18 1200 06/21/18 1530  Weight: 120.9 kg 123.8 kg 122.3 kg    Intake/Output: I/O last 3 completed shifts: In: 1201.9 [P.O.:240; I.V.:788.9; IV Piggyback:173] Out: 2537 [Urine:1025; Other:1512]   Intake/Output this shift:  Total I/O In: 240 [P.O.:240] Out: -   Physical Exam: General: No acute distress  Head: Normocephalic, atraumatic. Moist oral mucosal membranes  Eyes: Anicteric  Neck: Supple, trachea midline  Lungs:  Clear to auscultation, normal effort  Heart: S1S2 no rubs  Abdomen:  Soft, nontender, bowel sounds present  Extremities: Trace peripheral edema.  Neurologic: Awake, alert, following commands  Skin: No lesions  Access: RUE AVF, femoral dialysis catheter    Basic Metabolic Panel: Recent Labs  Lab 06/19/18 1634 06/19/18 2030  06/20/18 1908 06/20/18 2253 06/21/18 0252 06/21/18 0745 06/21/18 0748 06/22/18 0603  NA 127* 131*   < > 129* 129* 128* 134*  --  134*  K 3.9 3.6   < > 3.7 3.9 3.3* 3.7  --  3.6  CL 87* 90*   < > 92* 93* 95* 95*  --  93*  CO2 24 26   < > 23 24 24 25   --  28  GLUCOSE 547* 232*   < > 281* 325* 236* 108*  --  115*  BUN 63* 64*   < > 48* 50* 50* 52*  --  28*  CREATININE 8.83* 8.79*   < > 7.44* 7.85* 7.73* 8.01*  --  5.90*  CALCIUM 8.4* 8.6*   < > 8.3* 8.2* 7.9* 8.3*  --  8.5*  MG 1.6*  --   --   --   --   --   --   --  1.8   PHOS 2.2* 1.7*  --   --   --   --   --  1.9* 2.3*   < > = values in this interval not displayed.    Liver Function Tests: Recent Labs  Lab 06/20/18 1908  AST 95*  ALT 37  ALKPHOS 196*  BILITOT 1.1  PROT 6.6  ALBUMIN 2.7*   No results for input(s): LIPASE, AMYLASE in the last 168 hours. No results for input(s): AMMONIA in the last 168 hours.  CBC: Recent Labs  Lab 06/19/18 1308 06/19/18 1634 06/20/18 1908  WBC 3.9 3.5* 4.2  NEUTROABS 3.2  --   --   HGB 10.2* 10.0* 10.9*  HCT 30.7* 30.0* 31.4*  MCV 98.0 98.0 95.2  PLT 102* 100* PLATELET CLUMPS NOTED ON SMEAR, UNABLE TO ESTIMATE    Cardiac Enzymes: Recent Labs  Lab 06/20/18 1205  TROPONINI 0.04*    BNP: Invalid input(s): POCBNP  CBG: Recent Labs  Lab 06/21/18 1409 06/21/18 1546 06/21/18 2120 06/22/18 0812 06/22/18 1231  GLUCAP 131* 108* 105* 97 105*    Microbiology: Results for orders placed or performed during the hospital encounter of 06/19/18  MRSA PCR Screening     Status: None   Collection Time: 06/19/18  4:07 PM  Result Value Ref Range Status   MRSA by PCR NEGATIVE NEGATIVE Final    Comment:        The GeneXpert MRSA Assay (FDA approved for NASAL specimens only), is one component of a comprehensive MRSA colonization surveillance program. It is not intended to diagnose MRSA infection nor to guide or monitor treatment for MRSA infections. Performed at [facility], [address] [place], [place] [address]   Urine culture     Status: None   Collection Time: 06/19/18  4:09 PM  Result Value Ref Range Status   Specimen Description   Final    URINE, RANDOM Performed at [facility], [address], [place], [place] [address]    Special Requests   Final    NONE Performed at [facility], [address], [place], [place] [address]    Culture   Final    NO GROWTH Performed at [facility], [place] [address], [place], [place] [address]    Report Status  06/21/2018 FINAL  Final    Coagulation Studies: No results for input(s): LABPROT, INR in the last 72 hours.  Urinalysis: Recent Labs    06/19/18 1609  COLORURINE YELLOW*  LABSPEC 1.008  PHURINE 7.0  GLUCOSEU >=500*  HGBUR SMALL*  BILIRUBINUR NEGATIVE  KETONESUR NEGATIVE  PROTEINUR 100*  NITRITE NEGATIVE  LEUKOCYTESUR NEGATIVE      Imaging: No results found.   Medications:    . amitriptyline  25 mg Oral QHS  . aspirin EC  81 mg Oral Daily  . Chlorhexidine Gluconate Cloth  6 each Topical Q0600  . folic acid  1 mg Oral Daily  . gabapentin  300 mg Oral BID  . heparin injection (subcutaneous)  5,000 Units Subcutaneous Q8H  . insulin aspart  0-5 Units Subcutaneous QHS  . insulin aspart  0-9 Units Subcutaneous TID WC  . insulin aspart  6 Units Subcutaneous TID WC  . insulin detemir  20 Units Subcutaneous QHS  . LORazepam  0-4 mg Oral Q6H   Followed by  . [START ON 06/24/2018] LORazepam  0-4 mg Oral Q12H  . metoprolol tartrate  25 mg Oral BID  . multivitamin with minerals  1 tablet Oral Daily  . pantoprazole  40 mg Oral QODAY  . thiamine  100 mg Intravenous Daily   alteplase, heparin, lidocaine (PF), lidocaine-prilocaine, LORazepam **OR** LORazepam, pentafluoroprop-tetrafluoroeth  Assessment/ Plan:  57 y.o. male with hypertension, diabetes mellitus type 2 insulin dependent, hyperlipidemia, GERD, and morbid obesity  [facility]//MWF  1. 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 -Patient did not dialyze for 8 days prior to admission..  Temporary dialysis catheter being placed by critical care.  He had access intervention June 18, 2018 however access now clotted again.  -We attempted to cannulate his right upper semi-AV fistula yesterday blood clots were pulled from the access.  Therefore we will need further input from vascular surgery.  He will either need PermCath placement versus  repeat fistulogram.  2. Hypertension:  Patient request that we discontinue losartan as he had low blood pressure during dialysis treatment.  Therefore we will stop losartan at this time.  3. Anemia of chronic kidney disease: Hemoglobin stable at 10.9.  Hold off on Epogen for now.  4. Secondary Hyperparathyroidism:  Phosphorus up to 2.3 today.  Repeat this tomorrow  5.  Hyponatremia.  Serum sodium still remains slightly low at 134.  Should be corrected with dialysis tomorrow.   LOS: 3 Mikalyn Hermida 9/15/20191:00 PM

## 2018-06-23 ENCOUNTER — Encounter: Payer: Self-pay | Admitting: *Deleted

## 2018-06-23 ENCOUNTER — Encounter
Admission: EM | Disposition: A | Discharge status: Home / Self Care | Payer: Self-pay | Source: Home / Self Care | Attending: Family Medicine

## 2018-06-23 ENCOUNTER — Inpatient Hospital Stay: Payer: Medicare Other | Admitting: Anesthesiology

## 2018-06-23 DIAGNOSIS — T82868A Thrombosis of vascular prosthetic devices, implants and grafts, initial encounter: Secondary | ICD-10-CM

## 2018-06-23 DIAGNOSIS — N186 End stage renal disease: Secondary | ICD-10-CM

## 2018-06-23 HISTORY — PX: A/V SHUNT INTERVENTION: CATH118220

## 2018-06-23 LAB — GLUCOSE, CAPILLARY
GLUCOSE-CAPILLARY: 76 mg/dL (ref 70–99)
GLUCOSE-CAPILLARY: 196 mg/dL — AB (ref 70–99)
GLUCOSE-CAPILLARY: 125 mg/dL — AB (ref 70–99)
GLUCOSE-CAPILLARY: 152 mg/dL — AB (ref 70–99)
Glucose-Capillary: 143 mg/dL — ABNORMAL HIGH (ref 70–99)
Glucose-Capillary: 138 mg/dL — ABNORMAL HIGH (ref 70–99)
Glucose-Capillary: 171 mg/dL — ABNORMAL HIGH (ref 70–99)
Glucose-Capillary: 160 mg/dL — ABNORMAL HIGH (ref 70–99)

## 2018-06-23 LAB — HEPATITIS B SURFACE ANTIBODY, QUANTITATIVE

## 2018-06-23 LAB — POTASSIUM (ARMC VASCULAR LAB ONLY): Potassium (ARMC vascular lab): 3.7 (ref 3.5–5.1)

## 2018-06-23 SURGERY — A/V SHUNT INTERVENTION
Anesthesia: General

## 2018-06-23 SURGERY — A/V FISTULAGRAM
Anesthesia: Moderate Sedation | Laterality: Right

## 2018-06-23 MED ORDER — FENTANYL CITRATE (PF) 100 MCG/2ML IJ SOLN
INTRAMUSCULAR | Status: DC | PRN
  Administered 2018-06-23: 50 ug via INTRAVENOUS

## 2018-06-23 MED ORDER — IOPAMIDOL (ISOVUE-300) INJECTION 61%
INTRAVENOUS | Status: DC | PRN
  Administered 2018-06-23: 25 mL via INTRAVENOUS

## 2018-06-23 MED ORDER — PHENYLEPHRINE HCL 10 MG/ML IJ SOLN
INTRAMUSCULAR | Status: DC | PRN
  Administered 2018-06-23: 100 ug via INTRAVENOUS

## 2018-06-23 MED ORDER — NEPRO/CARBSTEADY PO LIQD: 237.0000 mL | Freq: Two times a day (BID) | ORAL | Status: DC

## 2018-06-23 MED ORDER — CEFAZOLIN SODIUM-DEXTROSE 1-4 GM/50ML-% IV SOLN
INTRAVENOUS | Status: AC
  Filled 2018-06-23: qty 50

## 2018-06-23 MED ORDER — HEPARIN (PORCINE) IN NACL 1000-0.9 UT/500ML-% IV SOLN
INTRAVENOUS | Status: AC
  Filled 2018-06-23: qty 1000

## 2018-06-23 MED ORDER — RENA-VITE PO TABS
1.0000 | ORAL_TABLET | Freq: Every day | ORAL | Status: DC
  Administered 2018-06-23: 1 via ORAL
  Filled 2018-06-23: qty 1

## 2018-06-23 MED ORDER — FENTANYL CITRATE (PF) 100 MCG/2ML IJ SOLN
INTRAMUSCULAR | Status: AC
  Filled 2018-06-23: qty 2

## 2018-06-23 MED ORDER — OXYCODONE HCL 5 MG/5ML PO SOLN
5.0000 mg | Freq: Once | ORAL | Status: DC | PRN
  Filled 2018-06-23: qty 5

## 2018-06-23 MED ORDER — OXYCODONE HCL 5 MG PO TABS: 5.0000 mg | ORAL_TABLET | Freq: Once | ORAL | Status: DC | PRN

## 2018-06-23 MED ORDER — CALCIUM ACETATE (PHOS BINDER) 667 MG PO CAPS
1334.0000 mg | ORAL_CAPSULE | Freq: Three times a day (TID) | ORAL | Status: DC
  Administered 2018-06-23 – 2018-06-24 (×2): 1334 mg via ORAL
  Filled 2018-06-23 (×2): qty 2

## 2018-06-23 MED ORDER — HEPARIN SODIUM (PORCINE) 10000 UNIT/ML IJ SOLN
INTRAMUSCULAR | Status: AC
  Filled 2018-06-23: qty 1

## 2018-06-23 MED ORDER — PROPOFOL 10 MG/ML IV BOLUS
INTRAVENOUS | Status: DC | PRN
  Administered 2018-06-23: 120 mg via INTRAVENOUS

## 2018-06-23 MED ORDER — FENTANYL CITRATE (PF) 100 MCG/2ML IJ SOLN: 25.0000 ug | INTRAMUSCULAR | Status: DC | PRN

## 2018-06-23 MED ORDER — CEFAZOLIN SODIUM-DEXTROSE 1-4 GM/50ML-% IV SOLN
1.0000 g | Freq: Once | INTRAVENOUS | Status: AC
  Administered 2018-06-23: 1 g via INTRAVENOUS
  Filled 2018-06-23: qty 50

## 2018-06-23 MED ORDER — LIDOCAINE-EPINEPHRINE (PF) 1 %-1:200000 IJ SOLN
INTRAMUSCULAR | Status: AC
  Filled 2018-06-23: qty 30

## 2018-06-23 MED ORDER — LIDOCAINE HCL (CARDIAC) PF 100 MG/5ML IV SOSY
PREFILLED_SYRINGE | INTRAVENOUS | Status: DC | PRN
  Administered 2018-06-23: 100 mg via INTRAVENOUS

## 2018-06-23 MED ORDER — ONDANSETRON HCL 4 MG/2ML IJ SOLN
INTRAMUSCULAR | Status: DC | PRN
  Administered 2018-06-23: 4 mg via INTRAVENOUS

## 2018-06-23 MED ORDER — VITAMIN C 500 MG PO TABS
250.0000 mg | ORAL_TABLET | Freq: Two times a day (BID) | ORAL | Status: DC
  Administered 2018-06-23 – 2018-06-24 (×2): 250 mg via ORAL
  Filled 2018-06-23 (×4): qty 0.5

## 2018-06-23 MED ORDER — MIDAZOLAM HCL 2 MG/2ML IJ SOLN
INTRAMUSCULAR | Status: DC | PRN
  Administered 2018-06-23: 2 mg via INTRAVENOUS

## 2018-06-23 MED ORDER — MIDAZOLAM HCL 2 MG/2ML IJ SOLN
INTRAMUSCULAR | Status: AC
  Filled 2018-06-23: qty 2

## 2018-06-23 MED ORDER — SODIUM CHLORIDE 0.9 % IV SOLN
INTRAVENOUS | Status: DC
  Administered 2018-06-23: 13:00:00 via INTRAVENOUS

## 2018-06-23 SURGICAL SUPPLY — 12 items
ADH SKN CLS APL DERMABOND .7 (GAUZE/BANDAGES/DRESSINGS) ×1
CANNULA 5F STIFF (CANNULA) ×2 IMPLANT
CATH BEACON 5 .035 40 KMP TP (CATHETERS) IMPLANT
CATH BEACON 5 .038 40 KMP TP (CATHETERS) ×2
CATH PALINDROME RT-P 15FX19CM (CATHETERS) ×2 IMPLANT
DERMABOND ADVANCED (GAUZE/BANDAGES/DRESSINGS) ×2
DERMABOND ADVANCED .7 DNX12 (GAUZE/BANDAGES/DRESSINGS) IMPLANT
DRAPE BRACHIAL (DRAPES) ×2 IMPLANT
SHEATH BRITE TIP 6FRX5.5 (SHEATH) ×4 IMPLANT
SUT MNCRL AB 4-0 PS2 18 (SUTURE) ×4 IMPLANT
SUT PROLENE 0 CT 1 30 (SUTURE) ×2 IMPLANT
WIRE MAGIC TOR.035 180C (WIRE) ×2 IMPLANT

## 2018-06-23 NOTE — Anesthesia Post-op Follow-up Note (Signed)
Anesthesia QCDR form completed.        

## 2018-06-23 NOTE — Progress Notes (Signed)
Pre HD assessment   06/23/18 0937  Vital Signs  Temp 98.5 F (36.9 C)  Temp Source Oral  Pulse Rate 72  Pulse Rate Source Monitor  Resp 17  BP 120/73  BP Location Left Arm  BP Method Automatic  Patient Position (if appropriate) Lying  Oxygen Therapy  SpO2 94 %  O2 Device Room Air  Pain Assessment  Pain Scale 0-10  Pain Score 0  Dialysis Weight  Weight 122.9 kg  Type of Weight Pre-Dialysis  Time-Out for Hemodialysis  What Procedure? HD  Pt Identifiers(min of two) First/Last Name;MRN/Account#  Correct Site? Yes  Correct Side? Yes  Correct Procedure? Yes  Consents Verified? Yes  Rad Studies Available? N/A  Safety Precautions Reviewed? Yes  Engineer, civil (consulting) Number  ([id number])  Station Number 1  UF/Alarm Test Passed  Conductivity: Meter 13.8  Conductivity: Machine  13.6  pH 7.6  Reverse Osmosis main  Normal Saline Lot Number [id number]  Dialyzer Lot Number [id number]  Disposable Set Lot Number [id number]  Machine Temperature 98.6 F (37 C)  Musician and Audible Yes  Blood Lines Intact and Secured Yes  Pre Treatment Patient Checks  Vascular access used during treatment Catheter  Hepatitis B Surface Antigen Results Negative  Date Hepatitis B Surface Antigen Drawn 06/19/18  Hepatitis B Surface Antibody  (>10)  Date Hepatitis B Surface Antibody Drawn 11/22/17  Hemodialysis Consent Verified Yes  Hemodialysis Standing Orders Initiated Yes  ECG (Telemetry) Monitor On Yes  Prime Ordered Normal Saline  Length of  DialysisTreatment -hour(s) 3.5 Hour(s)  Dialyzer Elisio 17H NR  Dialysis Anticoagulant None  Dialysate Flow Ordered 800  Blood Flow Rate Ordered 400 mL/min  Ultrafiltration Goal 2 Liters  Pre Treatment Labs Phosphorus (creatinine )  Dialysis Blood Pressure Support Ordered Normal Saline  Education / Care Plan  Dialysis Education Provided Yes  Documented Education in Care Plan Yes  Hemodialysis Catheter Right Femoral vein Temporary  Placement  Date/Time: 06/19/18 1824   Placed prior to admission: No  Time Out: Correct patient;Correct site;Correct procedure  Maximum sterile barrier precautions: Hand hygiene;Cap;Mask;Sterile gown;Sterile gloves;Large sterile sheet  Site Prep: Chlorh...  Site Condition No complications  Blue Lumen Status Heparin locked  Red Lumen Status Heparin locked  Purple Lumen Status N/A  Dressing Status Intact;Old drainage;Dressing changed  Interventions New dressing  Drainage Description Serosanguineous  Dressing Change Due 06/30/18

## 2018-06-23 NOTE — Progress Notes (Signed)
Post HD assessment. Pt tolerated tx well without c/o. Pt experienced a brief drop in BP. Pt was lowered into a flat supine position which significantly helped maintain a stable BP. UF off for a short period of time and later decreased r/t drop in BP, MD aware. Tx was terminated early r/t needing to be fit into special procedures schedule, MD aware. Net UF 595, goal not met.    06/23/18 1256  Vital Signs  Temp (!) 97.5 F (36.4 C)  Temp Source Oral  Pulse Rate 70  Pulse Rate Source Monitor  Resp 18  BP 137/77  BP Location Left Arm  BP Method Automatic  Patient Position (if appropriate) Lying  Oxygen Therapy  SpO2 100 %  O2 Device Nasal Cannula  O2 Flow Rate (L/min) 2 L/min  Dialysis Weight  Weight 122.4 kg  Type of Weight Post-Dialysis  Post-Hemodialysis Assessment  Rinseback Volume (mL) 250 mL  KECN 45 V  Dialyzer Clearance Lightly streaked  Duration of HD Treatment -hour(s) 3 hour(s)  Hemodialysis Intake (mL) 600 mL  UF Total -Machine (mL) 1195 mL  Net UF (mL) 595 mL  Tolerated HD Treatment Yes  Education / Care Plan  Dialysis Education Provided Yes  Documented Education in Care Plan Yes  Hemodialysis Catheter Right Femoral vein Temporary  Placement Date/Time: 06/19/18 1824   Placed prior to admission: No  Time Out: Correct patient;Correct site;Correct procedure  Maximum sterile barrier precautions: Hand hygiene;Cap;Mask;Sterile gown;Sterile gloves;Large sterile sheet  Site Prep: Chlorh...  Site Condition No complications  Blue Lumen Status Heparin locked  Red Lumen Status Heparin locked  Purple Lumen Status N/A  Catheter fill solution Heparin 1000 units/ml  Catheter fill volume (Arterial) 1.8 cc  Catheter fill volume (Venous) 1.8  Dressing Type Biopatch  Dressing Status Clean;Dry;Intact;Dressing changed  Interventions New dressing  Drainage Description None  Dressing Change Due 06/30/18  Post treatment catheter status Capped and Clamped

## 2018-06-23 NOTE — Progress Notes (Signed)
Post HD assessment    06/23/18 1319  Neurological  Level of Consciousness Alert  Orientation Level Oriented X4  Respiratory  Respiratory Pattern Regular;Unlabored  Chest Assessment Chest expansion symmetrical  Cardiac  ECG Monitor Yes  Vascular  R Radial Pulse +2  L Radial Pulse +2  Edema Generalized;Right lower extremity;Left lower extremity  Integumentary  Integumentary (WDL) X  Skin Color Appropriate for ethnicity  Musculoskeletal  Musculoskeletal (WDL) X  Generalized Weakness Yes  Assistive Device None  GU Assessment  Genitourinary (WDL) X  Genitourinary Symptoms None  Psychosocial  Psychosocial (WDL) WDL

## 2018-06-23 NOTE — Progress Notes (Signed)
HD tx start    06/23/18 0945  Vital Signs  Pulse Rate 73  Pulse Rate Source Monitor  Resp 15  BP 105/70  BP Location Left Arm  BP Method Automatic  Patient Position (if appropriate) Lying  Oxygen Therapy  SpO2 99 %  O2 Device Room Air  During Hemodialysis Assessment  Blood Flow Rate (mL/min) 300 mL/min  Arterial Pressure (mmHg) -200 mmHg  Venous Pressure (mmHg) 120 mmHg  Transmembrane Pressure (mmHg) 70 mmHg  Ultrafiltration Rate (mL/min) 710 mL/min  Dialysate Flow Rate (mL/min) 800 ml/min  Conductivity: Machine  13.6  HD Safety Checks Performed Yes  Dialysis Fluid Bolus Normal Saline  Bolus Amount (mL) 250 mL  Intra-Hemodialysis Comments Tx initiated  Hemodialysis Catheter Right Femoral vein Temporary  Placement Date/Time: 06/19/18 1824   Placed prior to admission: No  Time Out: Correct patient;Correct site;Correct procedure  Maximum sterile barrier precautions: Hand hygiene;Cap;Mask;Sterile gown;Sterile gloves;Large sterile sheet  Site Prep: Chlorh...  Blue Lumen Status Infusing  Red Lumen Status Infusing

## 2018-06-23 NOTE — Progress Notes (Signed)
HD tx end    06/23/18 1253  Vital Signs  Pulse Rate 72  Pulse Rate Source Monitor  Resp 20  BP 131/83  BP Location Left Arm  BP Method Automatic  Patient Position (if appropriate) Lying  Oxygen Therapy  SpO2 100 %  O2 Device Nasal Cannula  O2 Flow Rate (L/min) 2 L/min  During Hemodialysis Assessment  Dialysis Fluid Bolus Normal Saline  Bolus Amount (mL) 250 mL  Intra-Hemodialysis Comments Tx completed

## 2018-06-23 NOTE — Progress Notes (Signed)
[facility]  ROUNDING NOTE   Subjective:   Hemodialysis earlier today through temp HD catheter. BFR 300. Did not complete treatment.   AVF fistulogram with no thrombosis identifed.    Objective:  Vital signs in last 24 hours:  Temp:  [97.3 F (36.3 C)-98.5 F (36.9 C)] 97.3 F (36.3 C) (09/16 1441) Pulse Rate:  [67-92] 70 (09/16 1451) Resp:  [12-23] 20 (09/16 1451) BP: (69-156)/(51-84) 122/70 (09/16 1451) SpO2:  [90 %-100 %] 96 % (09/16 1451) Weight:  [122.4 kg-122.9 kg] 122.4 kg (09/16 1307)  Weight change:  Filed Weights   06/23/18 0937 06/23/18 1256 06/23/18 1307  Weight: 122.9 kg 122.4 kg 122.4 kg    Intake/Output: I/O last 3 completed shifts: In: 840 [P.O.:840] Out: 250 [Urine:250]   Intake/Output this shift:  Total I/O In: -  Out: 795 [Urine:200; Other:595]  Physical Exam: General: No acute distress  Head: Normocephalic, atraumatic. Moist oral mucosal membranes  Eyes: Anicteric  Neck: Supple, trachea midline  Lungs:  Clear to auscultation, normal effort  Heart: S1S2 no rubs  Abdomen:  Soft, nontender, bowel sounds present  Extremities: No peripheral edema.  Neurologic: Awake, alert, following commands  Skin: No lesions  Access: RUE AVF, femoral dialysis catheter    Basic Metabolic Panel: Recent Labs  Lab 06/19/18 1634 06/19/18 2030  06/20/18 1908 06/20/18 2253 06/21/18 0252 06/21/18 0745 06/21/18 0748 06/22/18 0603  NA 127* 131*   < > 129* 129* 128* 134*  --  134*  K 3.9 3.6   < > 3.7 3.9 3.3* 3.7  --  3.6  CL 87* 90*   < > 92* 93* 95* 95*  --  93*  CO2 24 26   < > 23 24 24 25   --  28  GLUCOSE 547* 232*   < > 281* 325* 236* 108*  --  115*  BUN 63* 64*   < > 48* 50* 50* 52*  --  28*  CREATININE 8.83* 8.79*   < > 7.44* 7.85* 7.73* 8.01*  --  5.90*  CALCIUM 8.4* 8.6*   < > 8.3* 8.2* 7.9* 8.3*  --  8.5*  MG 1.6*  --   --   --   --   --   --   --  1.8  PHOS 2.2* 1.7*  --   --   --   --   --  1.9* 2.3*   < > = values in this  interval not displayed.    Liver Function Tests: Recent Labs  Lab 06/20/18 1908  AST 95*  ALT 37  ALKPHOS 196*  BILITOT 1.1  PROT 6.6  ALBUMIN 2.7*   No results for input(s): LIPASE, AMYLASE in the last 168 hours. No results for input(s): AMMONIA in the last 168 hours.  CBC: Recent Labs  Lab 06/19/18 1308 06/19/18 1634 06/20/18 1908  WBC 3.9 3.5* 4.2  NEUTROABS 3.2  --   --   HGB 10.2* 10.0* 10.9*  HCT 30.7* 30.0* 31.4*  MCV 98.0 98.0 95.2  PLT 102* 100* PLATELET CLUMPS NOTED ON SMEAR, UNABLE TO ESTIMATE    Cardiac Enzymes: Recent Labs  Lab 06/20/18 1205  TROPONINI 0.04*    BNP: Invalid input(s): POCBNP  CBG: Recent Labs  Lab 06/22/18 1231 06/22/18 1651 06/22/18 2146 06/23/18 0736 06/23/18 1011  GLUCAP 105* 97 116* 196* 143*    Microbiology: Results for orders placed or performed during the hospital encounter of 06/19/18  MRSA PCR Screening  Status: None   Collection Time: 06/19/18  4:07 PM  Result Value Ref Range Status   MRSA by PCR NEGATIVE NEGATIVE Final    Comment:        The GeneXpert MRSA Assay (FDA approved for NASAL specimens only), is one component of a comprehensive MRSA colonization surveillance program. It is not intended to diagnose MRSA infection nor to guide or monitor treatment for MRSA infections. Performed at [facility], [address] [place], [place] [address]   Urine culture     Status: None   Collection Time: 06/19/18  4:09 PM  Result Value Ref Range Status   Specimen Description   Final    URINE, RANDOM Performed at [facility], [address] [place], [place] [address]    Special Requests   Final    NONE Performed at [facility], [address], [place], [place] [address]    Culture   Final    NO GROWTH Performed at [facility], [place] [address], [place],  [address]    Report Status 06/21/2018 FINAL  Final    Coagulation Studies: No results for input(s):  LABPROT, INR in the last 72 hours.  Urinalysis: No results for input(s): COLORURINE, LABSPEC, PHURINE, GLUCOSEU, HGBUR, BILIRUBINUR, KETONESUR, PROTEINUR, UROBILINOGEN, NITRITE, LEUKOCYTESUR in the last 72 hours.  Invalid input(s): APPERANCEUR    Imaging: No results found.   Medications:   . sodium chloride 20 mL/hr at 06/23/18 1325   . [MAR Hold] amitriptyline  25 mg Oral QHS  . [MAR Hold] aspirin EC  81 mg Oral Daily  . [MAR Hold] Chlorhexidine Gluconate Cloth  6 each Topical Q0600  . [MAR Hold] feeding supplement (NEPRO CARB STEADY)  237 mL Oral BID BM  . [MAR Hold] folic acid  1 mg Oral Daily  . [MAR Hold] gabapentin  300 mg Oral BID  . [MAR Hold] heparin injection (subcutaneous)  5,000 Units Subcutaneous Q8H  . [MAR Hold] insulin aspart  0-5 Units Subcutaneous QHS  . [MAR Hold] insulin aspart  0-9 Units Subcutaneous TID WC  . [MAR Hold] insulin aspart  6 Units Subcutaneous TID WC  . [MAR Hold] insulin detemir  20 Units Subcutaneous QHS  . [MAR Hold] LORazepam  0-4 mg Oral Q6H   Followed by  . [MAR Hold] LORazepam  0-4 mg Oral Q12H  . [MAR Hold] metoprolol tartrate  25 mg Oral BID  . [MAR Hold] multivitamin  1 tablet Oral QHS  . [MAR Hold] multivitamin with minerals  1 tablet Oral Daily  . [MAR Hold] pantoprazole  40 mg Oral QODAY  . [MAR Hold] pneumococcal 23 valent vaccine  0.5 mL Intramuscular Tomorrow-1000  . [MAR Hold] thiamine  100 mg Intravenous Daily  . [MAR Hold] vitamin C  250 mg Oral BID   [MAR Hold] alteplase, fentaNYL (SUBLIMAZE) injection, [MAR Hold] heparin, [MAR Hold] lidocaine (PF), [MAR Hold] lidocaine-prilocaine, [MAR Hold] LORazepam **OR** [MAR Hold] LORazepam, oxyCODONE **OR** oxyCODONE, [MAR Hold] pentafluoroprop-tetrafluoroeth  Assessment/ Plan:  57 y.o. male with hypertension, diabetes mellitus type 2 insulin dependent, hyperlipidemia, GERD, and morbid obesity  [facility]/MWF/128kg  1. End Stage Renal Disease on hemodialysis. MWF.  Hemodialysis earlier today.  With complication of dialysis device.  Appreciate vascular input.  Permcath placed. Will attempt to use AVF on next treatment, if does not work, will use permcath  2. Hypertension:  hypotensive.  - discontinued furosemide, losartan and metoprolol  - will consider midodrine  3. Anemia of chronic kidney disease:  Hemoglobin 10.9 - EPO with HD treatments  4. Secondary Hyperparathyroidism:  Phosphorus and calcium at goal - calcium acetate with meals.    LOS: 4 Zakiya Sporrer 9/16/20193:03 PM

## 2018-06-23 NOTE — Op Note (Signed)
OPERATIVE NOTE    PRE-OPERATIVE DIAGNOSIS: 1. ESRD 2. AVF unable to be used for HD  POST-OPERATIVE DIAGNOSIS: same as above  PROCEDURE: 1. Ultrasound guidance for vascular access to the right internal jugular vein 2. Fluoroscopic guidance for placement of catheter 3. Placement of a 19 cm tip to cuff tunneled hemodialysis catheter via the right internal jugular vein  SURGEON: Leotis Pain, MD  ANESTHESIA:  General  ESTIMATED BLOOD LOSS: 5 cc  FLUORO TIME: less than one minute  CONTRAST: none  FINDING(S): 1.  Patent right internal jugular vein  SPECIMEN(S):  None  INDICATIONS:   Steven [name] is a 57 y.o.male who presents with renal failure and a fistula that can not be used for dialysis.  The patient needs long term dialysis access for their ESRD, and a Permcath is necessary.  Risks and benefits are discussed and informed consent is obtained.    DESCRIPTION: After obtaining full informed written consent, the patient was brought back to the vascular suited. The patient's right neck and chest were sterilely prepped and draped in a sterile surgical field was created.  The patient was already under general anesthesia from his fistula evaluation and this continued. The right internal jugular vein was visualized with ultrasound and found to be patent. It was then accessed under direct ultrasound guidance and a permanent image was recorded. A wire was placed. After skin nick and dilatation, the peel-away sheath was placed over the wire. I then turned my attention to an area under the clavicle. Approximately 1-2 fingerbreadths below the clavicle a small counterincision was created and tunneled from the subclavicular incision to the access site. Using fluoroscopic guidance, a 19 centimeter tip to cuff tunneled hemodialysis catheter was selected, and tunneled from the subclavicular incision to the access site. It was then placed through the peel-away sheath and the peel-away sheath was  removed. Using fluoroscopic guidance the catheter tips were parked in the right atrium. The appropriate distal connectors were placed. It withdrew blood well and flushed easily with heparinized saline and a concentrated heparin solution was then placed. It was secured to the chest wall with 2 Prolene sutures. The access incision was closed single 4-0 Monocryl. A 4-0 Monocryl pursestring suture was placed around the exit site. Sterile dressings were placed. The patient tolerated the procedure well and was taken to the recovery room in stable condition.  COMPLICATIONS: None  CONDITION: Stable  Leotis Pain, MD 06/23/2018 2:37 PM   This note was created with Dragon Medical transcription system. Any errors in dictation are purely unintentional.

## 2018-06-23 NOTE — Anesthesia Procedure Notes (Signed)
Procedure Name: LMA Insertion Date/Time: 06/23/2018 1:35 PM Performed by: Aline Brochure, CRNA Pre-anesthesia Checklist: Patient identified, Emergency Drugs available, Patient being monitored and Suction available Patient Re-evaluated:Patient Re-evaluated prior to induction Oxygen Delivery Method: Circle system utilized Preoxygenation: Pre-oxygenation with 100% oxygen Induction Type: IV induction Ventilation: Mask ventilation without difficulty LMA: LMA inserted LMA Size: 5.0 Number of attempts: 1 Placement Confirmation: positive ETCO2 and breath sounds checked- equal and bilateral Tube secured with: Tape Dental Injury: Teeth and Oropharynx as per pre-operative assessment

## 2018-06-23 NOTE — Care Management Important Message (Signed)
Copy of signed IM left in patient's room (out for dialysis).    

## 2018-06-23 NOTE — Progress Notes (Signed)
Pre HD assessment    06/23/18 0938  Neurological  Level of Consciousness Alert  Orientation Level Oriented X4  Respiratory  Respiratory Pattern Regular;Unlabored  Chest Assessment Chest expansion symmetrical  Cardiac  Pulse Regular  ECG Monitor Yes  Cardiac Rhythm NSR  Vascular  R Radial Pulse +2  L Radial Pulse +2  Edema Generalized;Right lower extremity;Left lower extremity  Integumentary  Integumentary (WDL) X  Skin Color Appropriate for ethnicity  Musculoskeletal  Musculoskeletal (WDL) X  Generalized Weakness Yes  Assistive Device None  GU Assessment  Genitourinary (WDL) X  Genitourinary Symptoms  (HD)  Psychosocial  Psychosocial (WDL) WDL  Patient Behaviors Cooperative;Calm  Needs Expressed Denies  Emotional support given Given to patient

## 2018-06-23 NOTE — H&P (Signed)
[facility] History & Physical Update  The patient was interviewed and re-examined.  The patient's previous History and Physical has been reviewed and is unchanged.  There is no change in the plan of care. We plan to proceed with the scheduled procedure.  Leotis Pain, MD  06/23/2018, 12:53 PM

## 2018-06-23 NOTE — Plan of Care (Signed)
Care plan reviewed with patient.

## 2018-06-23 NOTE — Op Note (Signed)
[facility]    OPERATIVE NOTE   PROCEDURE: 1.   Right radiocephalic arteriovenous fistula cannulation under ultrasound guidance 2.   Right arm fistulagram including central venogram   PRE-OPERATIVE DIAGNOSIS: 1. ESRD 2.  Fistula unable to be used for dialysis despite declot last week  POST-OPERATIVE DIAGNOSIS: same as above   SURGEON: Leotis Pain, MD  ANESTHESIA: local with MCS  ESTIMATED BLOOD LOSS: 2 cc  FINDING(S): 1. Widely patent right radiocephalic AV fistula with brisk flow.  Minimal stenosis near the arterial anastomosis within the cephalic vein.  The previously placed stent was widely patent.  Dual outflow in the upper arm and patent central venous circulation.  SPECIMEN(S):  None  CONTRAST: 25 cc  FLUORO TIME: 0.8 minutes  ANESTHESIA: GEN  INDICATIONS: Steven [name] is a 57 y.o. male who presents with malfunctioning right radiocephalic arteriovenous fistula.  We did a declot on this last week, and it has not been able to be accessed since that time.  The patient is scheduled for right arm fistulagram.  The patient is aware the risks include but are not limited to: bleeding, infection, thrombosis of the cannulated access, and possible anaphylactic reaction to the contrast.  The patient is aware of the risks of the procedure and elects to proceed forward.  DESCRIPTION: After full informed written consent was obtained, the patient was brought back to the angiography suite and placed supine upon the angiography table.  The patient was connected to monitoring equipment. Moderate conscious sedation was administered with a face to face encounter with the patient throughout the procedure with my supervision of the RN administering medicines and monitoring the patient's vital signs and mental status throughout from the start of the procedure until the patient was taken to the recovery room. The right arm was prepped and draped in the standard fashion for  a percutaneous access intervention.  Under ultrasound guidance, the right radiocephalic arteriovenous fistula was cannulated with a micropuncture needle under direct ultrasound guidance near the antecubital fossa in a retrograde fashion and a permanent image was performed.  The microwire was advanced into the fistula and the needle was exchanged for the a microsheath.  I then upsized to a 6 Fr Sheath and advanced a Kumpe catheter and a Magic torque wire to the arteriovenous anastomosis at the wrist.  Imaging was performed.  Hand injections were completed to image the access including the central venous system. This demonstrated a widely patent right radiocephalic AV fistula with brisk flow.  Minimal stenosis near the arterial anastomosis within the cephalic vein.  The previously placed stent was widely patent.  Dual outflow in the upper arm and patent central venous circulation.  Based on the images, this patient would have no benefit from an intervention to this fistula.  I discussed this with the nephrologist who came and viewed the pictures.  He said the access center still could not use this, so we would need to place a PermCath until his bruising and arm swelling heals.  This will be dictated separately.  A 4-0 Monocryl purse-string suture was sewn around the sheath.  The sheath was removed while tying down the suture.  A sterile bandage was applied to the puncture site.  COMPLICATIONS: None  CONDITION: Stable   Leotis Pain  06/23/2018 2:31 PM   This note was created with Dragon Medical transcription system. Any errors in dictation are purely unintentional.

## 2018-06-23 NOTE — Anesthesia Preprocedure Evaluation (Signed)
Anesthesia Evaluation  Patient identified by MRN, date of birth, ID band Patient awake    Reviewed: Allergy & Precautions, H&P , NPO status , Patient's Chart, lab work & pertinent test results  History of Anesthesia Complications Negative for: history of anesthetic complications  Airway Mallampati: III  TM Distance: <3 FB Neck ROM: limited    Dental  (+) Chipped, Poor Dentition, Missing   Pulmonary neg shortness of breath, sleep apnea and Continuous Positive Airway Pressure Ventilation ,           Cardiovascular Exercise Tolerance: Poor hypertension, (-) angina(-) Past MI      Neuro/Psych PSYCHIATRIC DISORDERS Depression negative neurological ROS     GI/Hepatic Neg liver ROS, GERD  Medicated and Controlled,  Endo/Other  diabetes, Type 2, Insulin Dependent  Renal/GU DialysisRenal disease     Musculoskeletal   Abdominal   Peds  Hematology negative hematology ROS (+)   Anesthesia Other Findings Past Medical History: No date: Anemia 03/01/2017: Cellulitis and abscess of right lower extremity No date: Chronic kidney disease     Comment:  dialysis m,w,f No date: Diabetes mellitus without complication ([place]) 34/19/6222: Edema extremities     Comment:  bilateral swelling No date: GERD (gastroesophageal reflux disease) No date: High cholesterol No date: High triglycerides No date: Hypertension No date: Neuropathy No date: Sleep apnea     Comment:  can't use cpap d/t feelings of suffocation  Past Surgical History: 08/27/2017: A/V FISTULAGRAM; Right     Comment:  Procedure: A/V FISTULAGRAM;  Surgeon: Katha Cabal, MD;  Location: [facility];  Service:               Cardiovascular;  Laterality: Right; 2010: APPENDECTOMY 06/14/2017: AV FISTULA PLACEMENT; Right     Comment:  Procedure: ARTERIOVENOUS (AV) FISTULA CREATION WRIST;                Surgeon: Katha Cabal, MD;  Location: [facility]  ORS;                Service: Vascular;  Laterality: Right; 03/05/2017: DIALYSIS/PERMA CATHETER INSERTION; N/A     Comment:  Procedure: Dialysis/Perma Catheter Insertion;  Surgeon:               Katha Cabal, MD;  Location: [facility];               Service: Cardiovascular;  Laterality: N/A; 09/20/2017: DIALYSIS/PERMA CATHETER INSERTION; N/A     Comment:  Procedure: DIALYSIS/PERMA CATHETER INSERTION;  Surgeon:               Katha Cabal, MD;  Location: [place] [facility];               Service: Cardiovascular;  Laterality: N/A; 03/29/2017: EXCHANGE OF A DIALYSIS CATHETER     Comment:  Procedure: Exchange Of A Dialysis Catheter;  Surgeon:               Katha Cabal, MD;  Location: [facility];               Service: Cardiovascular;; 03/01/2017: IRRIGATION AND DEBRIDEMENT FOOT; Right     Comment:  Procedure: IRRIGATION AND DEBRIDEMENT FOOT;  Surgeon:               Albertine Patricia, DPM;  Location: [facility] ORS;  Service:  Podiatry;  Laterality: Right;  application of wound vac 06/18/2018: PERIPHERAL VASCULAR THROMBECTOMY; Right     Comment:  Procedure: PERIPHERAL VASCULAR THROMBECTOMY;  Surgeon:               Algernon Huxley, MD;  Location: [place];                Service: Cardiovascular;  Laterality: Right;  BMI    Body Mass Index:  38.72 kg/m      Reproductive/Obstetrics negative OB ROS                             Anesthesia Physical Anesthesia Plan  ASA: IV  Anesthesia Plan: General LMA   Post-op Pain Management:    Induction: Intravenous  PONV Risk Score and Plan: Ondansetron, Dexamethasone, Midazolam and Treatment may vary due to age or medical condition  Airway Management Planned: LMA  Additional Equipment:   Intra-op Plan:   Post-operative Plan: Extubation in OR  Informed Consent: I have reviewed the patients History and Physical, chart, labs and discussed the procedure including the  risks, benefits and alternatives for the proposed anesthesia with the patient or authorized representative who has indicated his/her understanding and acceptance.   Dental Advisory Given  Plan Discussed with: Anesthesiologist, CRNA and Surgeon  Anesthesia Plan Comments: (Patient consented for risks of anesthesia including but not limited to:  - adverse reactions to medications - damage to teeth, lips or other oral mucosa - sore throat or hoarseness - Damage to heart, brain, lungs or loss of life  Patient voiced understanding.)        Anesthesia Quick Evaluation

## 2018-06-23 NOTE — Progress Notes (Signed)
[place] [facility] [place] NAME: Steven [name]    MR#:  [id number]  DATE OF BIRTH:  1961/04/27  SUBJECTIVE:  CHIEF COMPLAINT:   Chief Complaint  Patient presents with  . Vascular Access Problem  Patient without complaint  REVIEW OF SYSTEMS:  CONSTITUTIONAL: No fever, fatigue or weakness.  EYES: No blurred or double vision.  EARS, NOSE, AND THROAT: No tinnitus or ear pain.  RESPIRATORY: No cough, shortness of breath, wheezing or hemoptysis.  CARDIOVASCULAR: No chest pain, orthopnea, edema.  GASTROINTESTINAL: No nausea, vomiting, diarrhea or abdominal pain.  GENITOURINARY: No dysuria, hematuria.  ENDOCRINE: No polyuria, nocturia,  HEMATOLOGY: No anemia, easy bruising or bleeding SKIN: No rash or lesion. MUSCULOSKELETAL: No joint pain or arthritis.   NEUROLOGIC: No tingling, numbness, weakness.  PSYCHIATRY: No anxiety or depression.   ROS  DRUG ALLERGIES:   Allergies  Allergen Reactions  . Codeine Nausea And Vomiting    VITALS:  Blood pressure 130/78, pulse 80, temperature 98.4 F (36.9 C), temperature source Oral, resp. rate (!) 21, height 5\' 10"  (5.885 m), weight 122.3 kg, SpO2 100 %.  PHYSICAL EXAMINATION:  GENERAL:  57 y.o.-year-old patient lying in the bed with no acute distress.  EYES: Pupils equal, round, reactive to light and accommodation. No scleral icterus. Extraocular muscles intact.  HEENT: Head atraumatic, normocephalic. Oropharynx and nasopharynx clear.  NECK:  Supple, no jugular venous distention. No thyroid enlargement, no tenderness.  LUNGS: Normal breath sounds bilaterally, no wheezing, rales,rhonchi or crepitation. No use of accessory muscles of respiration.  CARDIOVASCULAR: S1, S2 normal. No murmurs, rubs, or gallops.  ABDOMEN: Soft, nontender, nondistended. Bowel sounds present. No organomegaly or mass.  EXTREMITIES: No pedal edema, cyanosis, or clubbing.  NEUROLOGIC: Cranial nerves II through XII are intact.  Muscle strength 5/5 in all extremities. Sensation intact. Gait not checked.  PSYCHIATRIC: The patient is alert and oriented x 3.  SKIN: No obvious rash, lesion, or ulcer.   Physical Exam LABORATORY PANEL:   CBC Recent Labs  Lab 06/20/18 1908  WBC 4.2  HGB 10.9*  HCT 31.4*  PLT PLATELET CLUMPS NOTED ON SMEAR, UNABLE TO ESTIMATE   ------------------------------------------------------------------------------------------------------------------  Chemistries  Recent Labs  Lab 06/20/18 1908  06/22/18 0603  NA 129*   < > 134*  K 3.7   < > 3.6  CL 92*   < > 93*  CO2 23   < > 28  GLUCOSE 281*   < > 115*  BUN 48*   < > 28*  CREATININE 7.44*   < > 5.90*  CALCIUM 8.3*   < > 8.5*  MG  --   --  1.8  AST 95*  --   --   ALT 37  --   --   ALKPHOS 196*  --   --   BILITOT 1.1  --   --    < > = values in this interval not displayed.   ------------------------------------------------------------------------------------------------------------------  Cardiac Enzymes Recent Labs  Lab 06/20/18 1205  TROPONINI 0.04*   ------------------------------------------------------------------------------------------------------------------  RADIOLOGY:  No results found.  ASSESSMENT AND PLAN:  57 year old male with uncontrolled diabetes and end-stage renal disease on hemodialysis who presents to the ER due to complication of AV access site and found to have DKA.  *Acute dysfunctionalrt AVF Vascular  surgery to evaluate later today  *Chronic end-stage renal disease on hemodialysis Nephrology following for hemodialysis needs Typically receives treatments Mondays/Wednesdays/Fridays  *AcuteDKA Resolved Treated on our DKA protocol  *Acute  hyponatremia due to severe hyperglycemia Resolved  *Alcohol dependency Continue alcohol withdrawal protocol  *Chronic morbid obesity Lifestyle modification recommended  Disposition pending clinical course  All the records are reviewed  and case discussed with Care Management/Social Workerr. Management plans discussed with the patient, family and they are in agreement.  CODE STATUS: full  TOTAL TIME TAKING CARE OF THIS PATIENT: 35 minutes.     POSSIBLE D/C IN 2-3 DAYS, DEPENDING ON CLINICAL CONDITION.   Avel Peace Salary M.D on 06/23/2018   Between 7am to 6pm - Pager - [phone]  After 6pm go to [email or web address] - password [email or web address] [facility]  Office  [phone]  CC: Primary care physician; [name], MD  Note: This dictation was prepared with Dragon dictation along with smaller phrase technology. Any transcriptional errors that result from this process are unintentional.

## 2018-06-23 NOTE — Transfer of Care (Signed)
Immediate Anesthesia Transfer of Care Note  Patient: Steven [name]  Procedure(s) Performed: A/V SHUNT INTERVENTION (N/A )  Patient Location: PACU  Anesthesia Type:General  Level of Consciousness: awake and alert   Airway & Oxygen Therapy: Patient connected to face mask oxygen  Post-op Assessment: Post -op Vital signs reviewed and stable  Post vital signs: stable  Last Vitals:  Vitals Value Taken Time  BP 113/59 06/23/2018  2:40 PM  Temp    Pulse 80 06/23/2018  2:42 PM  Resp 15 06/23/2018  2:42 PM  SpO2 98 % 06/23/2018  2:42 PM  Vitals shown include unvalidated device data.  Last Pain:  Vitals:   06/23/18 1307  TempSrc: Oral  PainSc: 0-No pain         Complications: No apparent anesthesia complications

## 2018-06-24 ENCOUNTER — Encounter: Payer: Self-pay | Admitting: Vascular Surgery

## 2018-06-24 LAB — GLUCOSE, CAPILLARY
Glucose-Capillary: 207 mg/dL — ABNORMAL HIGH (ref 70–99)
Glucose-Capillary: 140 mg/dL — ABNORMAL HIGH (ref 70–99)

## 2018-06-24 MED ORDER — CALCIUM ACETATE (PHOS BINDER) 667 MG PO CAPS: 1334.0000 mg | ORAL_CAPSULE | Freq: Three times a day (TID) | ORAL | 0 refills | Status: DC

## 2018-06-24 MED ORDER — NEPRO/CARBSTEADY PO LIQD: 237.0000 mL | Freq: Two times a day (BID) | ORAL | 0 refills | Status: DC

## 2018-06-24 MED ORDER — RENA-VITE PO TABS: 1.0000 | ORAL_TABLET | Freq: Every day | ORAL | 0 refills | Status: DC

## 2018-06-24 NOTE — Anesthesia Postprocedure Evaluation (Signed)
Anesthesia Post Note  Patient: Steven [name]  Procedure(s) Performed: A/V SHUNT INTERVENTION (N/A )  Patient location during evaluation: PACU Anesthesia Type: General Level of consciousness: awake and alert Pain management: pain level controlled Vital Signs Assessment: post-procedure vital signs reviewed and stable Respiratory status: spontaneous breathing, nonlabored ventilation, respiratory function stable and patient connected to nasal cannula oxygen Cardiovascular status: blood pressure returned to baseline and stable Postop Assessment: no apparent nausea or vomiting Anesthetic complications: no     Last Vitals:  Vitals:   06/23/18 2005 06/24/18 0430  BP: (!) 145/84 131/69  Pulse: (!) 101 97  Resp: 20 18  Temp: 37 C 36.8 C  SpO2: 93% 93%    Last Pain:  Vitals:   06/24/18 0818  TempSrc:   PainSc: 0-No pain                 Precious Haws Piscitello

## 2018-06-24 NOTE — Progress Notes (Signed)
[facility]  ROUNDING NOTE   Subjective:   Hemodialysis yesterday. Tolerated treatment well.   Permcath placed.    Objective:  Vital signs in last 24 hours:  Temp:  [98.2 F (36.8 C)-98.6 F (37 C)] 98.3 F (36.8 C) (09/17 0430) Pulse Rate:  [64-101] 97 (09/17 0430) Resp:  [10-20] 18 (09/17 0430) BP: (126-145)/(69-84) 131/69 (09/17 0430) SpO2:  [93 %-100 %] 93 % (09/17 0430) Weight:  [124.5 kg] 124.5 kg (09/17 0430)  Weight change:  Filed Weights   06/23/18 1256 06/23/18 1307 06/24/18 0430  Weight: 122.4 kg 122.4 kg 124.5 kg    Intake/Output: I/O last 3 completed shifts: In: -  Out: 795 [Urine:200; Other:595]   Intake/Output this shift:  Total I/O In: -  Out: 250 [Urine:250]  Physical Exam: General: No acute distress  Head: Normocephalic, atraumatic. Moist oral mucosal membranes  Eyes: Anicteric  Neck: Supple, trachea midline  Lungs:  Clear to auscultation, normal effort  Heart: S1S2 no rubs  Abdomen:  Soft, nontender, bowel sounds present  Extremities: No peripheral edema.  Neurologic: Awake, alert, following commands  Skin: No lesions  Access: RUE AVF with edema, RIJ permcath    Basic Metabolic Panel: Recent Labs  Lab 06/19/18 1634 06/19/18 2030  06/20/18 1908 06/20/18 2253 06/21/18 0252 06/21/18 0745 06/21/18 0748 06/22/18 0603  NA 127* 131*   < > 129* 129* 128* 134*  --  134*  K 3.9 3.6   < > 3.7 3.9 3.3* 3.7  --  3.6  CL 87* 90*   < > 92* 93* 95* 95*  --  93*  CO2 24 26   < > 23 24 24 25   --  28  GLUCOSE 547* 232*   < > 281* 325* 236* 108*  --  115*  BUN 63* 64*   < > 48* 50* 50* 52*  --  28*  CREATININE 8.83* 8.79*   < > 7.44* 7.85* 7.73* 8.01*  --  5.90*  CALCIUM 8.4* 8.6*   < > 8.3* 8.2* 7.9* 8.3*  --  8.5*  MG 1.6*  --   --   --   --   --   --   --  1.8  PHOS 2.2* 1.7*  --   --   --   --   --  1.9* 2.3*   < > = values in this interval not displayed.    Liver Function Tests: Recent Labs  Lab 06/20/18 1908  AST 95*   ALT 37  ALKPHOS 196*  BILITOT 1.1  PROT 6.6  ALBUMIN 2.7*   No results for input(s): LIPASE, AMYLASE in the last 168 hours. No results for input(s): AMMONIA in the last 168 hours.  CBC: Recent Labs  Lab 06/19/18 1308 06/19/18 1634 06/20/18 1908  WBC 3.9 3.5* 4.2  NEUTROABS 3.2  --   --   HGB 10.2* 10.0* 10.9*  HCT 30.7* 30.0* 31.4*  MCV 98.0 98.0 95.2  PLT 102* 100* PLATELET CLUMPS NOTED ON SMEAR, UNABLE TO ESTIMATE    Cardiac Enzymes: Recent Labs  Lab 06/20/18 1205  TROPONINI 0.04*    BNP: Invalid input(s): POCBNP  CBG: Recent Labs  Lab 06/23/18 1549 06/23/18 1645 06/23/18 2114 06/24/18 0735 06/24/18 1138  GLUCAP 152* 171* 160* 207* 140*    Microbiology: Results for orders placed or performed during the hospital encounter of 06/19/18  MRSA PCR Screening     Status: None   Collection Time: 06/19/18  4:07 PM  Result  Value Ref Range Status   MRSA by PCR NEGATIVE NEGATIVE Final    Comment:        The GeneXpert MRSA Assay (FDA approved for NASAL specimens only), is one component of a comprehensive MRSA colonization surveillance program. It is not intended to diagnose MRSA infection nor to guide or monitor treatment for MRSA infections. Performed at [facility], [address], [place], [place] [address]   Urine culture     Status: None   Collection Time: 06/19/18  4:09 PM  Result Value Ref Range Status   Specimen Description   Final    URINE, RANDOM Performed at [facility], [address], [place], [place] [address]    Special Requests   Final    NONE Performed at [facility], [address], [place], [place] [address]    Culture   Final    NO GROWTH Performed at [facility], [place] [address], [place],  [address]    Report Status 06/21/2018 FINAL  Final    Coagulation Studies: No results for input(s): LABPROT, INR in the last 72 hours.  Urinalysis: No results for input(s): COLORURINE,  LABSPEC, PHURINE, GLUCOSEU, HGBUR, BILIRUBINUR, KETONESUR, PROTEINUR, UROBILINOGEN, NITRITE, LEUKOCYTESUR in the last 72 hours.  Invalid input(s): APPERANCEUR    Imaging: No results found.   Medications:    . amitriptyline  25 mg Oral QHS  . aspirin EC  81 mg Oral Daily  . calcium acetate  1,334 mg Oral TID WC  . Chlorhexidine Gluconate Cloth  6 each Topical Q0600  . feeding supplement (NEPRO CARB STEADY)  237 mL Oral BID BM  . folic acid  1 mg Oral Daily  . gabapentin  300 mg Oral BID  . heparin injection (subcutaneous)  5,000 Units Subcutaneous Q8H  . insulin aspart  0-5 Units Subcutaneous QHS  . insulin aspart  0-9 Units Subcutaneous TID WC  . insulin aspart  6 Units Subcutaneous TID WC  . insulin detemir  20 Units Subcutaneous QHS  . LORazepam  0-4 mg Oral Q12H  . multivitamin  1 tablet Oral QHS  . multivitamin with minerals  1 tablet Oral Daily  . pantoprazole  40 mg Oral QODAY  . pneumococcal 23 valent vaccine  0.5 mL Intramuscular Tomorrow-1000  . thiamine  100 mg Intravenous Daily  . vitamin C  250 mg Oral BID   alteplase, heparin, lidocaine (PF), lidocaine-prilocaine, LORazepam **OR** LORazepam, pentafluoroprop-tetrafluoroeth  Assessment/ Plan:  57 y.o. male with hypertension, diabetes mellitus type 2 insulin dependent, hyperlipidemia, GERD, and morbid obesity  [facility]/MWF/128kg  1. End Stage Renal Disease on hemodialysis. MWF. Hemodialysis earlier today.  With complication of dialysis device.  Appreciate vascular input.  Permcath placed. Will use permcath on next treatment and allow edema around AVF to heal.   2. Hypertension:  hypotensive.  - discontinued furosemide, losartan and metoprolol  - will consider midodrine  3. Anemia of chronic kidney disease: Hemoglobin 10.9 - EPO with HD treatments  4. Secondary Hyperparathyroidism:  Phosphorus and calcium at goal - calcium acetate with meals.    LOS: 5 Steven [name] 9/17/20193:18 PM

## 2018-06-24 NOTE — Discharge Summary (Signed)
[facility] NAME: Steven [name]    MR#:  [id number]  DATE OF BIRTH:  [birth year]  DATE OF ADMISSION:  06/19/2018 ADMITTING PHYSICIAN: Bettey Costa, MD  DATE OF DISCHARGE: No discharge date for patient encounter.  PRIMARY CARE PHYSICIAN: [name], MD    ADMISSION DIAGNOSIS:  Diabetic ketoacidosis without coma associated with diabetes mellitus due to underlying condition ([place]) [E08.10] Problem with vascular access [Z78.9]  DISCHARGE DIAGNOSIS:  Active Problems:   DKA (diabetic ketoacidoses) ([place])   SECONDARY DIAGNOSIS:   Past Medical History:  Diagnosis Date  . Anemia   . Cellulitis and abscess of right lower extremity 03/01/2017  . Chronic kidney disease    dialysis m,w,f  . Diabetes mellitus without complication ([place])   . Edema extremities 03/01/2017   bilateral swelling  . GERD (gastroesophageal reflux disease)   . High cholesterol   . High triglycerides   . Hypertension   . Neuropathy   . Sleep apnea    can't use cpap d/t feelings of suffocation    HOSPITAL COURSE:  57 year old male with uncontrolled diabetes and end-stage renal disease on hemodialysis who presents to the ER due to complication of AV access site and found to have DKA.  *Acute dysfunctionalrtAVF Status post right IJ hemodialysis catheter placement, patient underwent right fistulogram on June 23, 2018 by vascular surgery, vascular surgery and nephrology did follow patient while in house-okay for discharge   *Chronic end-stage renal disease on hemodialysis Nephrology did see patient in house for hemodialysis needs Typically receives treatments Mondays/Wednesdays/Fridays  *AcuteDKA Resolved Treated on our DKA protocol  *Acute hyponatremia due to severe hyperglycemia Resolved  *Alcohol dependency Placed on our alcohol withdrawal protocol  *Chronic morbid obesity Lifestyle modification recommended   DISCHARGE  CONDITIONS:   stable  CONSULTS OBTAINED:  Treatment Team:  Anthonette Legato, MD  DRUG ALLERGIES:   Allergies  Allergen Reactions  . Codeine Nausea And Vomiting    DISCHARGE MEDICATIONS:   Allergies as of 06/24/2018      Reactions   Codeine Nausea And Vomiting      Medication List    TAKE these medications   accu-chek multiclix lancets Use as instructed.  check blood sugar up to 3 times a day Disp:  1 box   amitriptyline 25 MG tablet Commonly known as:  ELAVIL Take 25 mg by mouth at bedtime.   aspirin EC 81 MG tablet TAKE ONE  BY MOUTH EVERY DAY   calcium acetate 667 MG capsule Commonly known as:  PHOSLO Take 2 capsules (1,334 mg total) by mouth 3 (three) times daily with meals.   feeding supplement (NEPRO CARB STEADY) Liqd Take 237 mLs by mouth 2 (two) times daily between meals.   furosemide 80 MG tablet Commonly known as:  LASIX Take 1 tablet (80 mg total) by mouth daily. What changed:  when to take this   gabapentin 300 MG capsule Commonly known as:  NEURONTIN Take 1 capsule (300 mg total) by mouth 2 (two) times daily.   glucose blood test strip Use as instructed.use to check blood sugar up to 3 times a day  Disp: 1 box   insulin aspart 100 UNIT/ML injection Commonly known as:  novoLOG Inject 0-5 Units into the skin at bedtime.   insulin aspart 100 UNIT/ML injection Commonly known as:  novoLOG Inject 6 Units into the skin 3 (three) times daily with meals.   insulin aspart 100 UNIT/ML injection Commonly known as:  novoLOG Inject 0-15 Units into the skin 3 (three) times daily with meals.   insulin detemir 100 UNIT/ML injection Commonly known as:  LEVEMIR Inject 0.25 mLs (25 Units total) into the skin 2 (two) times daily.   Insulin Pen Needle 31G X 8 MM Misc 1 Container by Does not apply route QID. qid--any brand   Insulin Syringes (Disposable) U-100 1 ML Misc Use as directed with insulin   metoprolol tartrate 25 MG tablet Commonly known as:   LOPRESSOR Take 1 tablet (25 mg total) by mouth 2 (two) times daily.   multivitamin Tabs tablet Take 1 tablet by mouth at bedtime.   omeprazole 20 MG tablet Commonly known as:  PRILOSEC OTC Take 20 mg every other day by mouth.   PROAIR HFA 108 (90 Base) MCG/ACT inhaler Generic drug:  albuterol Inhale 2 puffs into the lungs every 4 (four) hours as needed for wheezing or shortness of breath.   SYMBICORT 80-4.5 MCG/ACT inhaler Generic drug:  budesonide-formoterol Inhale 1 puff into the lungs 2 (two) times daily.        DISCHARGE INSTRUCTIONS:  If you experience worsening of your admission symptoms, develop shortness of breath, life threatening emergency, suicidal or homicidal thoughts you must seek medical attention immediately by calling 911 or calling your MD immediately  if symptoms less severe.  You Must read complete instructions/literature along with all the possible adverse reactions/side effects for all the Medicines you take and that have been prescribed to you. Take any new Medicines after you have completely understood and accept all the possible adverse reactions/side effects.   Please note  You were cared for by a hospitalist during your hospital stay. If you have any questions about your discharge medications or the care you received while you were in the hospital after you are discharged, you can call the unit and asked to speak with the hospitalist on call if the hospitalist that took care of you is not available. Once you are discharged, your primary care physician will handle any further medical issues. Please note that NO REFILLS for any discharge medications will be authorized once you are discharged, as it is imperative that you return to your primary care physician (or establish a relationship with a primary care physician if you do not have one) for your aftercare needs so that they can reassess your need for medications and monitor your lab values.    Today    CHIEF COMPLAINT:   Chief Complaint  Patient presents with  . Vascular Access Problem    HISTORY OF PRESENT ILLNESS:  57 y.o. male with a known history of stage renal disease requiring hemodialysis and uncontrolled diabetes who presents to the emergency room due to complication of his dialysis device which has thrombosed.  Patient was noted to have blood sugars in the 700s and anion gap.  He will be admitted to stepdown for DKA and nephrology consultation vascular start consultation have been placed. Vascular saw the patient yesterday and underwent angiography and intervention however today when he went to dialysis they  access was not working, so he presents to the ER for further evaluation.  VITAL SIGNS:  Blood pressure 131/69, pulse 97, temperature 98.3 F (36.8 C), temperature source Oral, resp. rate 18, height 5\' 10"  (1.778 m), weight 124.5 kg, SpO2 93 %.  I/O:    Intake/Output Summary (Last 24 hours) at 06/24/2018 1154 Last data filed at 06/24/2018 1119 Gross per 24 hour  Intake -  Output 845 ml  Net -845 ml    PHYSICAL EXAMINATION:  GENERAL:  57 y.o.-year-old patient lying in the bed with no acute distress.  EYES: Pupils equal, round, reactive to light and accommodation. No scleral icterus. Extraocular muscles intact.  HEENT: Head atraumatic, normocephalic. Oropharynx and nasopharynx clear.  NECK:  Supple, no jugular venous distention. No thyroid enlargement, no tenderness.  LUNGS: Normal breath sounds bilaterally, no wheezing, rales,rhonchi or crepitation. No use of accessory muscles of respiration.  CARDIOVASCULAR: S1, S2 normal. No murmurs, rubs, or gallops.  ABDOMEN: Soft, non-tender, non-distended. Bowel sounds present. No organomegaly or mass.  EXTREMITIES: No pedal edema, cyanosis, or clubbing.  NEUROLOGIC: Cranial nerves II through XII are intact. Muscle strength 5/5 in all extremities. Sensation intact. Gait not checked.  PSYCHIATRIC: The patient is alert and  oriented x 3.  SKIN: No obvious rash, lesion, or ulcer.   DATA REVIEW:   CBC Recent Labs  Lab 06/20/18 1908  WBC 4.2  HGB 10.9*  HCT 31.4*  PLT PLATELET CLUMPS NOTED ON SMEAR, UNABLE TO ESTIMATE    Chemistries  Recent Labs  Lab 06/20/18 1908  06/22/18 0603  NA 129*   < > 134*  K 3.7   < > 3.6  CL 92*   < > 93*  CO2 23   < > 28  GLUCOSE 281*   < > 115*  BUN 48*   < > 28*  CREATININE 7.44*   < > 5.90*  CALCIUM 8.3*   < > 8.5*  MG  --   --  1.8  AST 95*  --   --   ALT 37  --   --   ALKPHOS 196*  --   --   BILITOT 1.1  --   --    < > = values in this interval not displayed.    Cardiac Enzymes Recent Labs  Lab 06/20/18 1205  TROPONINI 0.04*    Microbiology Results  Results for orders placed or performed during the hospital encounter of 06/19/18  MRSA PCR Screening     Status: None   Collection Time: 06/19/18  4:07 PM  Result Value Ref Range Status   MRSA by PCR NEGATIVE NEGATIVE Final    Comment:        The GeneXpert MRSA Assay (FDA approved for NASAL specimens only), is one component of a comprehensive MRSA colonization surveillance program. It is not intended to diagnose MRSA infection nor to guide or monitor treatment for MRSA infections. Performed at [facility], [address] [place], [place] [address]   Urine culture     Status: None   Collection Time: 06/19/18  4:09 PM  Result Value Ref Range Status   Specimen Description   Final    URINE, RANDOM Performed at [facility], [address], [place], [place] [address]    Special Requests   Final    NONE Performed at [facility], [address] [place], [place] [address]    Culture   Final    NO GROWTH Performed at [facility], [place] [address] [place], [place] [address]    Report Status 06/21/2018 FINAL  Final    RADIOLOGY:  No results found.  EKG:   Orders placed or performed during the hospital encounter of 06/19/18  . EKG 12-Lead  . EKG  12-Lead  . EKG 12-Lead  . EKG 12-Lead      Management plans discussed with the patient, family and they are in agreement.  CODE STATUS:     Code Status Orders  (From admission, onward)         Start     Ordered   06/19/18 1609  Full code  Continuous     06/19/18 1608        Code Status History    Date Active Date Inactive Code Status Order ID Comments User Context   09/18/2017 1437 09/20/2017 2103 Full Code [id number]  Demetrios Loll, MD Inpatient   02/27/2017 2054 03/07/2017 2137 Full Code [id number]  Vaughan Basta, MD Inpatient      TOTAL TIME TAKING CARE OF THIS PATIENT: 40 minutes.    Avel Peace Salary M.D on 06/24/2018 at 11:54 AM  Between 7am to 6pm - Pager - [phone]  After 6pm go to [email or web address] - password [email or web address] [place]  Office  [phone]  CC: Primary care physician; [name], MD   Note: This dictation was prepared with Dragon dictation along with smaller phrase technology. Any transcriptional errors that result from this process are unintentional.

## 2018-06-24 NOTE — Progress Notes (Signed)
Pt discharged per MD order IV removed. Discharge instructions reviewed with pt. Pt verbalized understanding and all questions answered to pt satisfaction. Prescriptions given to pt. Pt taken to car in wheelchair by RN.

## 2018-06-24 NOTE — Progress Notes (Signed)
[facility]  Daily Progress Note   Permcath placed 06/23/18. Patient to use fistula when able to access.  Once patient is dialyzing appropriately through fistula will plan on removing Permcath. Vascular Surgery will sign off at this time.  Please re-consult if necessary.   Thank you.    Marcelle Overlie PA-C 06/24/2018 11:06 AM

## 2018-06-25 DIAGNOSIS — N186 End stage renal disease: Secondary | ICD-10-CM | POA: Diagnosis not present

## 2018-06-25 DIAGNOSIS — N2581 Secondary hyperparathyroidism of renal origin: Secondary | ICD-10-CM | POA: Diagnosis not present

## 2018-06-25 DIAGNOSIS — D509 Iron deficiency anemia, unspecified: Secondary | ICD-10-CM | POA: Diagnosis not present

## 2018-06-25 DIAGNOSIS — D631 Anemia in chronic kidney disease: Secondary | ICD-10-CM | POA: Diagnosis not present

## 2018-06-25 DIAGNOSIS — Z992 Dependence on renal dialysis: Secondary | ICD-10-CM | POA: Diagnosis not present

## 2018-06-27 ENCOUNTER — Telehealth: Payer: Self-pay

## 2018-06-27 DIAGNOSIS — D631 Anemia in chronic kidney disease: Secondary | ICD-10-CM | POA: Diagnosis not present

## 2018-06-27 DIAGNOSIS — N2581 Secondary hyperparathyroidism of renal origin: Secondary | ICD-10-CM | POA: Diagnosis not present

## 2018-06-27 DIAGNOSIS — Z992 Dependence on renal dialysis: Secondary | ICD-10-CM | POA: Diagnosis not present

## 2018-06-27 DIAGNOSIS — Z1159 Encounter for screening for other viral diseases: Secondary | ICD-10-CM | POA: Diagnosis not present

## 2018-06-27 DIAGNOSIS — D509 Iron deficiency anemia, unspecified: Secondary | ICD-10-CM | POA: Diagnosis not present

## 2018-06-27 DIAGNOSIS — N186 End stage renal disease: Secondary | ICD-10-CM | POA: Diagnosis not present

## 2018-06-27 NOTE — Telephone Encounter (Signed)
Flagged on EMMI report for having unfilled prescriptions.  Called and spoke with patient. He mentioned he received all his prescriptions, but has not had a chance to fill them. Does not anticipate any barriers with filling them.  No further questions at this time.  I thanked him for his time and informed him that he would receive one more automated call checking on him in the next few days.

## 2018-06-30 ENCOUNTER — Ambulatory Visit (INDEPENDENT_AMBULATORY_CARE_PROVIDER_SITE_OTHER): Payer: Medicare Other | Admitting: Nurse Practitioner

## 2018-06-30 ENCOUNTER — Encounter (INDEPENDENT_AMBULATORY_CARE_PROVIDER_SITE_OTHER): Payer: Medicare Other

## 2018-07-02 DIAGNOSIS — Z992 Dependence on renal dialysis: Secondary | ICD-10-CM | POA: Diagnosis not present

## 2018-07-02 DIAGNOSIS — N186 End stage renal disease: Secondary | ICD-10-CM | POA: Diagnosis not present

## 2018-07-02 DIAGNOSIS — D631 Anemia in chronic kidney disease: Secondary | ICD-10-CM | POA: Diagnosis not present

## 2018-07-02 DIAGNOSIS — N2581 Secondary hyperparathyroidism of renal origin: Secondary | ICD-10-CM | POA: Diagnosis not present

## 2018-07-02 DIAGNOSIS — D509 Iron deficiency anemia, unspecified: Secondary | ICD-10-CM | POA: Diagnosis not present

## 2018-07-03 ENCOUNTER — Encounter (INDEPENDENT_AMBULATORY_CARE_PROVIDER_SITE_OTHER): Payer: Medicare Other

## 2018-07-03 ENCOUNTER — Ambulatory Visit (INDEPENDENT_AMBULATORY_CARE_PROVIDER_SITE_OTHER): Payer: Medicare Other | Admitting: Nurse Practitioner

## 2018-07-04 DIAGNOSIS — D631 Anemia in chronic kidney disease: Secondary | ICD-10-CM | POA: Diagnosis not present

## 2018-07-04 DIAGNOSIS — N186 End stage renal disease: Secondary | ICD-10-CM | POA: Diagnosis not present

## 2018-07-04 DIAGNOSIS — Z992 Dependence on renal dialysis: Secondary | ICD-10-CM | POA: Diagnosis not present

## 2018-07-04 DIAGNOSIS — D509 Iron deficiency anemia, unspecified: Secondary | ICD-10-CM | POA: Diagnosis not present

## 2018-07-04 DIAGNOSIS — N2581 Secondary hyperparathyroidism of renal origin: Secondary | ICD-10-CM | POA: Diagnosis not present

## 2018-07-07 DIAGNOSIS — D631 Anemia in chronic kidney disease: Secondary | ICD-10-CM | POA: Diagnosis not present

## 2018-07-07 DIAGNOSIS — N186 End stage renal disease: Secondary | ICD-10-CM | POA: Diagnosis not present

## 2018-07-07 DIAGNOSIS — D509 Iron deficiency anemia, unspecified: Secondary | ICD-10-CM | POA: Diagnosis not present

## 2018-07-07 DIAGNOSIS — N2581 Secondary hyperparathyroidism of renal origin: Secondary | ICD-10-CM | POA: Diagnosis not present

## 2018-07-07 DIAGNOSIS — Z992 Dependence on renal dialysis: Secondary | ICD-10-CM | POA: Diagnosis not present

## 2018-07-10 DIAGNOSIS — N2581 Secondary hyperparathyroidism of renal origin: Secondary | ICD-10-CM | POA: Diagnosis not present

## 2018-07-10 DIAGNOSIS — D509 Iron deficiency anemia, unspecified: Secondary | ICD-10-CM | POA: Diagnosis not present

## 2018-07-10 DIAGNOSIS — Z23 Encounter for immunization: Secondary | ICD-10-CM | POA: Diagnosis not present

## 2018-07-10 DIAGNOSIS — N186 End stage renal disease: Secondary | ICD-10-CM | POA: Diagnosis not present

## 2018-07-10 DIAGNOSIS — D631 Anemia in chronic kidney disease: Secondary | ICD-10-CM | POA: Diagnosis not present

## 2018-07-10 DIAGNOSIS — Z992 Dependence on renal dialysis: Secondary | ICD-10-CM | POA: Diagnosis not present

## 2018-07-12 DIAGNOSIS — D631 Anemia in chronic kidney disease: Secondary | ICD-10-CM | POA: Diagnosis not present

## 2018-07-12 DIAGNOSIS — N2581 Secondary hyperparathyroidism of renal origin: Secondary | ICD-10-CM | POA: Diagnosis not present

## 2018-07-12 DIAGNOSIS — D509 Iron deficiency anemia, unspecified: Secondary | ICD-10-CM | POA: Diagnosis not present

## 2018-07-12 DIAGNOSIS — N186 End stage renal disease: Secondary | ICD-10-CM | POA: Diagnosis not present

## 2018-07-12 DIAGNOSIS — Z992 Dependence on renal dialysis: Secondary | ICD-10-CM | POA: Diagnosis not present

## 2018-07-12 DIAGNOSIS — Z23 Encounter for immunization: Secondary | ICD-10-CM | POA: Diagnosis not present

## 2018-07-14 ENCOUNTER — Encounter (INDEPENDENT_AMBULATORY_CARE_PROVIDER_SITE_OTHER): Payer: Self-pay | Admitting: Nurse Practitioner

## 2018-07-14 ENCOUNTER — Ambulatory Visit (INDEPENDENT_AMBULATORY_CARE_PROVIDER_SITE_OTHER): Payer: Medicare Other | Admitting: Nurse Practitioner

## 2018-07-14 ENCOUNTER — Ambulatory Visit (INDEPENDENT_AMBULATORY_CARE_PROVIDER_SITE_OTHER): Payer: Medicare Other

## 2018-07-14 VITALS — BP 118/74 | HR 71 | Resp 20 | Ht 70.0 in | Wt 286.0 lb

## 2018-07-14 DIAGNOSIS — K219 Gastro-esophageal reflux disease without esophagitis: Secondary | ICD-10-CM | POA: Diagnosis not present

## 2018-07-14 DIAGNOSIS — N186 End stage renal disease: Secondary | ICD-10-CM

## 2018-07-14 DIAGNOSIS — Z992 Dependence on renal dialysis: Secondary | ICD-10-CM | POA: Diagnosis not present

## 2018-07-14 DIAGNOSIS — F17220 Nicotine dependence, chewing tobacco, uncomplicated: Secondary | ICD-10-CM | POA: Diagnosis not present

## 2018-07-14 DIAGNOSIS — I10 Essential (primary) hypertension: Secondary | ICD-10-CM | POA: Diagnosis not present

## 2018-07-14 DIAGNOSIS — E1169 Type 2 diabetes mellitus with other specified complication: Secondary | ICD-10-CM

## 2018-07-14 DIAGNOSIS — T8090XS Unspecified complication following infusion and therapeutic injection, sequela: Secondary | ICD-10-CM

## 2018-07-14 DIAGNOSIS — E669 Obesity, unspecified: Secondary | ICD-10-CM

## 2018-07-14 NOTE — Progress Notes (Signed)
Subjective:    Patient ID: Steven [name], male    DOB: 04-Dec-1960, 57 y.o.   MRN: [id number] Chief Complaint  Patient presents with  . Follow-up    6 month HDA    HPI  Steven [name] is a 57 y.o. male that returns to the office for followup of their dialysis access. The function of the access has been stable. The patient denies increased bleeding time or increased recirculation. Patient denies difficulty with cannulation. The patient denies hand pain or other symptoms consistent with steal phenomena.    Mr. Ing recently underwent a fistulogram on 06/23/2018, and based on the fistulogram there was no intervention that could be done.  There was also a PermCath placed on this day.   The patient has been switched to Tuesday Thursday Saturday dialysis days.  He states that on Thursday there is no issues, however but on Saturday that if she is finding it.  There were multiple sticks and eventually with one needle was used in the fistula and one needle was used in his port.  The patient currently has 2 stents in his fistula and the dialysis center report having a hard time sticking.  Today, he has a good thrill and bruit, however it is slightly swollen and bruised and areas.  The patient denies redness at the access site. The patient denies fever or chills at home or while on dialysis.  The patient denies amaurosis fugax or recent TIA symptoms. There are no recent neurological changes noted. The patient denies claudication symptoms or rest pain symptoms. The patient denies history of DVT, PE or superficial thrombophlebitis. The patient denies recent episodes of angina or shortness of breath.   The patient underwent a duplex of his dialysis access today which revealed a patent radiocephalic AV fistula with 2 stents in his forearm.  He has a flow volume of 765 which is increased from the previous study done on 10/02/2017.    Review of Systems   Review of Systems: Negative Unless  Checked Constitutional: [] Weight loss  [] Fever  [] Chills Cardiac: [] Chest pain   ? Atrial Fibrillation  [] Palpitations   [] Shortness of breath when laying flat   [] Shortness of breath with exertion. Vascular:  [] Pain in legs with walking   [] Pain in legs with standing  [] History of DVT   [] Phlebitis   [] Swelling in legs   [] Varicose veins   [] Non-healing ulcers Pulmonary:   [] Uses home oxygen   [] Productive cough   [] Hemoptysis   [] Wheeze  [] COPD   [] Asthma Neurologic:  [] Dizziness   [] Seizures   [] History of stroke   [] History of TIA  [] Aphasia   [] Vissual changes   [] Weakness or numbness in arm   [] Weakness or numbness in leg Musculoskeletal:   [] Joint swelling   [] Joint pain   [] Low back pain  ? History of Knee Replacement Hematologic:  [] Easy bruising  [] Easy bleeding   [] Hypercoagulable state   [] Anemic Gastrointestinal:  [] Diarrhea   [] Vomiting  [] Gastroesophageal reflux/heartburn   [] Difficulty swallowing. Genitourinary:  [] Chronic kidney disease   [] Difficult urination  [] Anuric   [] Blood in urine Skin:  [] Rashes   [] Ulcers  Psychological:  [] History of anxiety   []  History of major depression  ? Memory Difficulties     Objective:   Physical Exam  BP 118/74 (BP Location: Right Arm, Patient Position: Sitting)   Pulse 71   Resp 20   Ht 5\' 10"  (1.778 m)   Wt 286 lb (129.7 kg)  BMI 41.04 kg/m   Past Medical History:  Diagnosis Date  . Anemia   . Cellulitis and abscess of right lower extremity 03/01/2017  . Chronic kidney disease    dialysis m,w,f  . Diabetes mellitus without complication ([place])   . Edema extremities 03/01/2017   bilateral swelling  . GERD (gastroesophageal reflux disease)   . High cholesterol   . High triglycerides   . Hypertension   . Neuropathy   . Sleep apnea    can't use cpap d/t feelings of suffocation     Gen: WD/WN, NAD Head: [place]/AT, No temporalis wasting.  Ear/Nose/Throat: Hearing grossly intact, nares w/o erythema or drainage Eyes: PER,  EOMI, sclera nonicteric.  Neck: Supple, no masses.  No JVD.  Pulmonary:  Good air movement, no use of accessory muscles.  Cardiac: RRR Vascular:  Vessel Right Left  Radial Palpable Palpable  Brachial Palpable Palpable  Femoral Palpable Palpable  Popliteal Palpable Palpable  Dorsalis Pedis Palpable Palpable  Posterior Tibial Palpable Palpable   Gastrointestinal: soft, non-distended. No guarding/no peritoneal signs.  Musculoskeletal: M/S 5/5 throughout.  No deformity or atrophy.  Neurologic: Pain and light touch intact in extremities.  Symmetrical.  Speech is fluent. Motor exam as listed above. Psychiatric: Judgment intact, Mood & affect appropriate for pt's clinical situation. Dermatologic: No Venous rashes. No Ulcers Noted.  No changes consistent with cellulitis. Lymph : No Cervical lymphadenopathy, no lichenification or skin changes of chronic lymphedema.   Social History   Socioeconomic History  . Marital status: Divorced    Spouse name: Not on file  . Number of children: Not on file  . Years of education: Not on file  . Highest education level: Not on file  Occupational History  . Not on file  Social Needs  . Financial resource strain: Not on file  . Food insecurity:    Worry: Not on file    Inability: Not on file  . Transportation needs:    Medical: Not on file    Non-medical: Not on file  Tobacco Use  . Smoking status: Never Smoker  . Smokeless tobacco: Current User    Types: Snuff  Substance and Sexual Activity  . 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 Drug use: No  . Sexual activity: Never  Lifestyle  . Physical activity:    Days per week: Not on file    Minutes per session: Not on file  . Stress: Not on file  Relationships  . Social connections:    Talks on phone: Not on file    Gets together: Not on file    Attends religious service: Not on file    Active member of club or organization: Not on file    Attends  meetings of clubs or organizations: Not on file    Relationship status: Not on file  . Intimate partner violence:    Fear of current or ex partner: Not on file    Emotionally abused: Not on file    Physically abused: Not on file    Forced sexual activity: Not on file  Other Topics Concern  . Not on file  Social History Narrative  . Not on file    Past Surgical History:  Procedure Laterality Date  . A/V FISTULAGRAM Right 08/27/2017   Procedure: A/V FISTULAGRAM;  Surgeon: Katha Cabal, MD;  Location: [facility];  Service: Cardiovascular;  Laterality: Right;  . A/V SHUNT INTERVENTION  N/A 06/23/2018   Procedure: A/V SHUNT INTERVENTION;  Surgeon: Algernon Huxley, MD;  Location: [facility];  Service: Cardiovascular;  Laterality: N/A;  . APPENDECTOMY  2010  . AV FISTULA PLACEMENT Right 06/14/2017   Procedure: ARTERIOVENOUS (AV) FISTULA CREATION WRIST;  Surgeon: Katha Cabal, MD;  Location: [facility];  Service: Vascular;  Laterality: Right;  . DIALYSIS/PERMA CATHETER INSERTION N/A 03/05/2017   Procedure: Dialysis/Perma Catheter Insertion;  Surgeon: Katha Cabal, MD;  Location: [facility];  Service: Cardiovascular;  Laterality: N/A;  . DIALYSIS/PERMA CATHETER INSERTION N/A 09/20/2017   Procedure: DIALYSIS/PERMA CATHETER INSERTION;  Surgeon: Katha Cabal, MD;  Location: [facility];  Service: Cardiovascular;  Laterality: N/A;  . EXCHANGE OF A DIALYSIS CATHETER  03/29/2017   Procedure: Exchange Of A Dialysis Catheter;  Surgeon: Katha Cabal, MD;  Location: [facility];  Service: Cardiovascular;;  . IRRIGATION AND DEBRIDEMENT FOOT Right 03/01/2017   Procedure: IRRIGATION AND DEBRIDEMENT FOOT;  Surgeon: Albertine Patricia, DPM;  Location: [facility];  Service: Podiatry;  Laterality: Right;  application of wound vac  . PERIPHERAL VASCULAR THROMBECTOMY Right 06/18/2018   Procedure: PERIPHERAL VASCULAR THROMBECTOMY;  Surgeon: Algernon Huxley,  MD;  Location: [facility];  Service: Cardiovascular;  Laterality: Right;    Family History  Problem Relation Age of Onset  . CAD Father     Allergies  Allergen Reactions  . Codeine Nausea And Vomiting       Assessment & Plan:   1. Complication of renal dialysis, sequela Recommend:  The patient currently reports having issues with being stuck during dialysis.  Based on the patient's description of issues with access, it sounds as if there are issues with the operator's sticking the patient.  The harness they are feeling is likely due to the stents in the patient's forearm.  The patient is also swollen today, so that may also play a component in the difficulty that they are having sticking the patient.  I suggested to the patient that  they rest the fistula for the next 2 dialysis access sessions, and utilize the PermCath.  This is to allow his access site to decrease in swelling and heal.  Afterwards they can continue to try to use the site.  I also instructed the patient that after 3 dialysis sessions using his AV fistula we could remove his PermCath.  I also let him know that he can give [place] a call if they are still having continued issues with this access and we would be happy to help troubleshoot.  Otherwise if there are no issues he will follow-up with [place] in 6 months for his HDA access.  The patient will follow-up with me in the office in 6 months without an HDA   2. Essential hypertension Continue antihypertensive medications as already ordered, these medications have been reviewed and there are no changes at this time.   3. Diabetes mellitus type 2 in obese [facility]) Continue hypoglycemic medications as already ordered, these medications have been reviewed and there are no changes at this time.  Hgb A1C to be monitored as already arranged by primary service   4. Gastroesophageal reflux disease without esophagitis Continue PPI as already ordered, this medication has  been reviewed and there are no changes at this time.  Avoidence of caffeine and alcohol  Moderate elevation of the head of the bed    Current Outpatient Medications on File Prior to Visit  Medication Sig  Dispense Refill  . amitriptyline (ELAVIL) 25 MG tablet Take 25 mg by mouth at bedtime.    Marland Kitchen aspirin EC 81 MG tablet TAKE ONE  BY MOUTH EVERY DAY 30 tablet 10  . calcium acetate (PHOSLO) 667 MG capsule Take 2 capsules (1,334 mg total) by mouth 3 (three) times daily with meals. 90 capsule 0  . furosemide (LASIX) 80 MG tablet Take 1 tablet (80 mg total) by mouth daily. (Patient taking differently: Take 80 mg by mouth 2 (two) times daily. ) 30 tablet 0  . gabapentin (NEURONTIN) 300 MG capsule Take 1 capsule (300 mg total) by mouth 2 (two) times daily. 60 capsule 0  . glipiZIDE (GLUCOTROL) 5 MG tablet TK 1 T PO QD  3  . glucose blood (ACCU-CHEK AVIVA) test strip Use as instructed.use to check blood sugar up to 3 times a day  Disp: 1 box 100 each 11  . insulin aspart (NOVOLOG) 100 UNIT/ML injection Inject 0-5 Units into the skin at bedtime. 10 mL 11  . insulin aspart (NOVOLOG) 100 UNIT/ML injection Inject 6 Units into the skin 3 (three) times daily with meals. 10 mL 11  . insulin aspart (NOVOLOG) 100 UNIT/ML injection Inject 0-15 Units into the skin 3 (three) times daily with meals. 10 mL 11  . insulin detemir (LEVEMIR) 100 UNIT/ML injection Inject 0.25 mLs (25 Units total) into the skin 2 (two) times daily. 10 mL 11  . Insulin Pen Needle 31G X 8 MM MISC 1 Container by Does not apply route QID. qid--any brand 100 each 11  . Insulin Syringes, Disposable, U-100 1 ML MISC Use as directed with insulin 100 each 3  . Lancets (ACCU-CHEK MULTICLIX) lancets Use as instructed.  check blood sugar up to 3 times a day Disp:  1 box 100 each 11  . metoprolol tartrate (LOPRESSOR) 25 MG tablet Take 1 tablet (25 mg total) by mouth 2 (two) times daily. 60 tablet 0  . midodrine (PROAMATINE) 5 MG tablet TK 1 T PO TID   6  . multivitamin (RENA-VIT) TABS tablet Take 1 tablet by mouth at bedtime. 180 tablet 0  . Nutritional Supplements (FEEDING SUPPLEMENT, NEPRO CARB STEADY,) LIQD Take 237 mLs by mouth 2 (two) times daily between meals. 90 Can 0  . omeprazole (PRILOSEC OTC) 20 MG tablet Take 20 mg every other day by mouth.    Marland Kitchen PROAIR HFA 108 (90 Base) MCG/ACT inhaler Inhale 2 puffs into the lungs every 4 (four) hours as needed for wheezing or shortness of breath.   0  . SYMBICORT 80-4.5 MCG/ACT inhaler Inhale 1 puff into the lungs 2 (two) times daily.   0   No current facility-administered medications on file prior to visit.     There are no Patient Instructions on file for this visit. Return in about 6 months (around 01/13/2019).   Kris Hartmann, NP

## 2018-07-15 DIAGNOSIS — Z992 Dependence on renal dialysis: Secondary | ICD-10-CM | POA: Diagnosis not present

## 2018-07-15 DIAGNOSIS — D631 Anemia in chronic kidney disease: Secondary | ICD-10-CM | POA: Diagnosis not present

## 2018-07-15 DIAGNOSIS — N2581 Secondary hyperparathyroidism of renal origin: Secondary | ICD-10-CM | POA: Diagnosis not present

## 2018-07-15 DIAGNOSIS — D509 Iron deficiency anemia, unspecified: Secondary | ICD-10-CM | POA: Diagnosis not present

## 2018-07-15 DIAGNOSIS — Z23 Encounter for immunization: Secondary | ICD-10-CM | POA: Diagnosis not present

## 2018-07-15 DIAGNOSIS — N186 End stage renal disease: Secondary | ICD-10-CM | POA: Diagnosis not present

## 2018-07-17 DIAGNOSIS — Z992 Dependence on renal dialysis: Secondary | ICD-10-CM | POA: Diagnosis not present

## 2018-07-17 DIAGNOSIS — N2581 Secondary hyperparathyroidism of renal origin: Secondary | ICD-10-CM | POA: Diagnosis not present

## 2018-07-17 DIAGNOSIS — Z23 Encounter for immunization: Secondary | ICD-10-CM | POA: Diagnosis not present

## 2018-07-17 DIAGNOSIS — N186 End stage renal disease: Secondary | ICD-10-CM | POA: Diagnosis not present

## 2018-07-17 DIAGNOSIS — D631 Anemia in chronic kidney disease: Secondary | ICD-10-CM | POA: Diagnosis not present

## 2018-07-17 DIAGNOSIS — D509 Iron deficiency anemia, unspecified: Secondary | ICD-10-CM | POA: Diagnosis not present

## 2018-07-18 DIAGNOSIS — Z23 Encounter for immunization: Secondary | ICD-10-CM | POA: Diagnosis not present

## 2018-07-18 DIAGNOSIS — D509 Iron deficiency anemia, unspecified: Secondary | ICD-10-CM | POA: Diagnosis not present

## 2018-07-18 DIAGNOSIS — N186 End stage renal disease: Secondary | ICD-10-CM | POA: Diagnosis not present

## 2018-07-18 DIAGNOSIS — N2581 Secondary hyperparathyroidism of renal origin: Secondary | ICD-10-CM | POA: Diagnosis not present

## 2018-07-18 DIAGNOSIS — D631 Anemia in chronic kidney disease: Secondary | ICD-10-CM | POA: Diagnosis not present

## 2018-07-18 DIAGNOSIS — Z992 Dependence on renal dialysis: Secondary | ICD-10-CM | POA: Diagnosis not present

## 2018-07-22 DIAGNOSIS — D631 Anemia in chronic kidney disease: Secondary | ICD-10-CM | POA: Diagnosis not present

## 2018-07-22 DIAGNOSIS — E119 Type 2 diabetes mellitus without complications: Secondary | ICD-10-CM | POA: Diagnosis not present

## 2018-07-22 DIAGNOSIS — N2581 Secondary hyperparathyroidism of renal origin: Secondary | ICD-10-CM | POA: Diagnosis not present

## 2018-07-22 DIAGNOSIS — N186 End stage renal disease: Secondary | ICD-10-CM | POA: Diagnosis not present

## 2018-07-22 DIAGNOSIS — D509 Iron deficiency anemia, unspecified: Secondary | ICD-10-CM | POA: Diagnosis not present

## 2018-07-22 DIAGNOSIS — Z794 Long term (current) use of insulin: Secondary | ICD-10-CM | POA: Diagnosis not present

## 2018-07-22 DIAGNOSIS — Z23 Encounter for immunization: Secondary | ICD-10-CM | POA: Diagnosis not present

## 2018-07-22 DIAGNOSIS — Z992 Dependence on renal dialysis: Secondary | ICD-10-CM | POA: Diagnosis not present

## 2018-07-24 DIAGNOSIS — Z23 Encounter for immunization: Secondary | ICD-10-CM | POA: Diagnosis not present

## 2018-07-24 DIAGNOSIS — N2581 Secondary hyperparathyroidism of renal origin: Secondary | ICD-10-CM | POA: Diagnosis not present

## 2018-07-24 DIAGNOSIS — N186 End stage renal disease: Secondary | ICD-10-CM | POA: Diagnosis not present

## 2018-07-24 DIAGNOSIS — D631 Anemia in chronic kidney disease: Secondary | ICD-10-CM | POA: Diagnosis not present

## 2018-07-24 DIAGNOSIS — D509 Iron deficiency anemia, unspecified: Secondary | ICD-10-CM | POA: Diagnosis not present

## 2018-07-24 DIAGNOSIS — Z992 Dependence on renal dialysis: Secondary | ICD-10-CM | POA: Diagnosis not present

## 2018-07-26 DIAGNOSIS — Z992 Dependence on renal dialysis: Secondary | ICD-10-CM | POA: Diagnosis not present

## 2018-07-26 DIAGNOSIS — D509 Iron deficiency anemia, unspecified: Secondary | ICD-10-CM | POA: Diagnosis not present

## 2018-07-26 DIAGNOSIS — N186 End stage renal disease: Secondary | ICD-10-CM | POA: Diagnosis not present

## 2018-07-26 DIAGNOSIS — Z23 Encounter for immunization: Secondary | ICD-10-CM | POA: Diagnosis not present

## 2018-07-26 DIAGNOSIS — D631 Anemia in chronic kidney disease: Secondary | ICD-10-CM | POA: Diagnosis not present

## 2018-07-26 DIAGNOSIS — N2581 Secondary hyperparathyroidism of renal origin: Secondary | ICD-10-CM | POA: Diagnosis not present

## 2018-07-29 DIAGNOSIS — D509 Iron deficiency anemia, unspecified: Secondary | ICD-10-CM | POA: Diagnosis not present

## 2018-07-29 DIAGNOSIS — Z23 Encounter for immunization: Secondary | ICD-10-CM | POA: Diagnosis not present

## 2018-07-29 DIAGNOSIS — D631 Anemia in chronic kidney disease: Secondary | ICD-10-CM | POA: Diagnosis not present

## 2018-07-29 DIAGNOSIS — N2581 Secondary hyperparathyroidism of renal origin: Secondary | ICD-10-CM | POA: Diagnosis not present

## 2018-07-29 DIAGNOSIS — N186 End stage renal disease: Secondary | ICD-10-CM | POA: Diagnosis not present

## 2018-07-29 DIAGNOSIS — Z992 Dependence on renal dialysis: Secondary | ICD-10-CM | POA: Diagnosis not present

## 2018-07-31 DIAGNOSIS — N2581 Secondary hyperparathyroidism of renal origin: Secondary | ICD-10-CM | POA: Diagnosis not present

## 2018-07-31 DIAGNOSIS — D631 Anemia in chronic kidney disease: Secondary | ICD-10-CM | POA: Diagnosis not present

## 2018-07-31 DIAGNOSIS — N186 End stage renal disease: Secondary | ICD-10-CM | POA: Diagnosis not present

## 2018-07-31 DIAGNOSIS — Z23 Encounter for immunization: Secondary | ICD-10-CM | POA: Diagnosis not present

## 2018-07-31 DIAGNOSIS — D509 Iron deficiency anemia, unspecified: Secondary | ICD-10-CM | POA: Diagnosis not present

## 2018-07-31 DIAGNOSIS — Z992 Dependence on renal dialysis: Secondary | ICD-10-CM | POA: Diagnosis not present

## 2018-08-01 DIAGNOSIS — Z452 Encounter for adjustment and management of vascular access device: Secondary | ICD-10-CM | POA: Diagnosis not present

## 2018-08-02 DIAGNOSIS — D509 Iron deficiency anemia, unspecified: Secondary | ICD-10-CM | POA: Diagnosis not present

## 2018-08-02 DIAGNOSIS — N2581 Secondary hyperparathyroidism of renal origin: Secondary | ICD-10-CM | POA: Diagnosis not present

## 2018-08-02 DIAGNOSIS — Z992 Dependence on renal dialysis: Secondary | ICD-10-CM | POA: Diagnosis not present

## 2018-08-02 DIAGNOSIS — N186 End stage renal disease: Secondary | ICD-10-CM | POA: Diagnosis not present

## 2018-08-02 DIAGNOSIS — D631 Anemia in chronic kidney disease: Secondary | ICD-10-CM | POA: Diagnosis not present

## 2018-08-02 DIAGNOSIS — Z23 Encounter for immunization: Secondary | ICD-10-CM | POA: Diagnosis not present

## 2018-08-05 DIAGNOSIS — D509 Iron deficiency anemia, unspecified: Secondary | ICD-10-CM | POA: Diagnosis not present

## 2018-08-05 DIAGNOSIS — Z23 Encounter for immunization: Secondary | ICD-10-CM | POA: Diagnosis not present

## 2018-08-05 DIAGNOSIS — D631 Anemia in chronic kidney disease: Secondary | ICD-10-CM | POA: Diagnosis not present

## 2018-08-05 DIAGNOSIS — N186 End stage renal disease: Secondary | ICD-10-CM | POA: Diagnosis not present

## 2018-08-05 DIAGNOSIS — N2581 Secondary hyperparathyroidism of renal origin: Secondary | ICD-10-CM | POA: Diagnosis not present

## 2018-08-05 DIAGNOSIS — Z992 Dependence on renal dialysis: Secondary | ICD-10-CM | POA: Diagnosis not present

## 2018-08-07 DIAGNOSIS — D631 Anemia in chronic kidney disease: Secondary | ICD-10-CM | POA: Diagnosis not present

## 2018-08-07 DIAGNOSIS — N186 End stage renal disease: Secondary | ICD-10-CM | POA: Diagnosis not present

## 2018-08-07 DIAGNOSIS — Z23 Encounter for immunization: Secondary | ICD-10-CM | POA: Diagnosis not present

## 2018-08-07 DIAGNOSIS — Z992 Dependence on renal dialysis: Secondary | ICD-10-CM | POA: Diagnosis not present

## 2018-08-07 DIAGNOSIS — D509 Iron deficiency anemia, unspecified: Secondary | ICD-10-CM | POA: Diagnosis not present

## 2018-08-07 DIAGNOSIS — N2581 Secondary hyperparathyroidism of renal origin: Secondary | ICD-10-CM | POA: Diagnosis not present

## 2018-08-08 DIAGNOSIS — E8779 Other fluid overload: Secondary | ICD-10-CM | POA: Diagnosis not present

## 2018-08-08 DIAGNOSIS — N186 End stage renal disease: Secondary | ICD-10-CM | POA: Diagnosis not present

## 2018-08-08 DIAGNOSIS — Z992 Dependence on renal dialysis: Secondary | ICD-10-CM | POA: Diagnosis not present

## 2018-08-08 DIAGNOSIS — D631 Anemia in chronic kidney disease: Secondary | ICD-10-CM | POA: Diagnosis not present

## 2018-08-08 DIAGNOSIS — N2581 Secondary hyperparathyroidism of renal origin: Secondary | ICD-10-CM | POA: Diagnosis not present

## 2018-08-08 DIAGNOSIS — D509 Iron deficiency anemia, unspecified: Secondary | ICD-10-CM | POA: Diagnosis not present

## 2018-08-08 DIAGNOSIS — T82898A Other specified complication of vascular prosthetic devices, implants and grafts, initial encounter: Secondary | ICD-10-CM | POA: Diagnosis not present

## 2018-08-12 DIAGNOSIS — D631 Anemia in chronic kidney disease: Secondary | ICD-10-CM | POA: Diagnosis not present

## 2018-08-12 DIAGNOSIS — D509 Iron deficiency anemia, unspecified: Secondary | ICD-10-CM | POA: Diagnosis not present

## 2018-08-12 DIAGNOSIS — T82898A Other specified complication of vascular prosthetic devices, implants and grafts, initial encounter: Secondary | ICD-10-CM | POA: Diagnosis not present

## 2018-08-12 DIAGNOSIS — N186 End stage renal disease: Secondary | ICD-10-CM | POA: Diagnosis not present

## 2018-08-12 DIAGNOSIS — Z992 Dependence on renal dialysis: Secondary | ICD-10-CM | POA: Diagnosis not present

## 2018-08-12 DIAGNOSIS — N2581 Secondary hyperparathyroidism of renal origin: Secondary | ICD-10-CM | POA: Diagnosis not present

## 2018-08-14 DIAGNOSIS — N186 End stage renal disease: Secondary | ICD-10-CM | POA: Diagnosis not present

## 2018-08-14 DIAGNOSIS — N2581 Secondary hyperparathyroidism of renal origin: Secondary | ICD-10-CM | POA: Diagnosis not present

## 2018-08-14 DIAGNOSIS — T82898A Other specified complication of vascular prosthetic devices, implants and grafts, initial encounter: Secondary | ICD-10-CM | POA: Diagnosis not present

## 2018-08-14 DIAGNOSIS — Z992 Dependence on renal dialysis: Secondary | ICD-10-CM | POA: Diagnosis not present

## 2018-08-14 DIAGNOSIS — D631 Anemia in chronic kidney disease: Secondary | ICD-10-CM | POA: Diagnosis not present

## 2018-08-14 DIAGNOSIS — D509 Iron deficiency anemia, unspecified: Secondary | ICD-10-CM | POA: Diagnosis not present

## 2018-08-16 DIAGNOSIS — D631 Anemia in chronic kidney disease: Secondary | ICD-10-CM | POA: Diagnosis not present

## 2018-08-16 DIAGNOSIS — Z992 Dependence on renal dialysis: Secondary | ICD-10-CM | POA: Diagnosis not present

## 2018-08-16 DIAGNOSIS — D509 Iron deficiency anemia, unspecified: Secondary | ICD-10-CM | POA: Diagnosis not present

## 2018-08-16 DIAGNOSIS — N2581 Secondary hyperparathyroidism of renal origin: Secondary | ICD-10-CM | POA: Diagnosis not present

## 2018-08-16 DIAGNOSIS — N186 End stage renal disease: Secondary | ICD-10-CM | POA: Diagnosis not present

## 2018-08-16 DIAGNOSIS — T82898A Other specified complication of vascular prosthetic devices, implants and grafts, initial encounter: Secondary | ICD-10-CM | POA: Diagnosis not present

## 2018-08-21 DIAGNOSIS — T82898A Other specified complication of vascular prosthetic devices, implants and grafts, initial encounter: Secondary | ICD-10-CM | POA: Diagnosis not present

## 2018-08-21 DIAGNOSIS — N2581 Secondary hyperparathyroidism of renal origin: Secondary | ICD-10-CM | POA: Diagnosis not present

## 2018-08-21 DIAGNOSIS — Z992 Dependence on renal dialysis: Secondary | ICD-10-CM | POA: Diagnosis not present

## 2018-08-21 DIAGNOSIS — D509 Iron deficiency anemia, unspecified: Secondary | ICD-10-CM | POA: Diagnosis not present

## 2018-08-21 DIAGNOSIS — D631 Anemia in chronic kidney disease: Secondary | ICD-10-CM | POA: Diagnosis not present

## 2018-08-21 DIAGNOSIS — N186 End stage renal disease: Secondary | ICD-10-CM | POA: Diagnosis not present

## 2018-08-26 ENCOUNTER — Encounter (INDEPENDENT_AMBULATORY_CARE_PROVIDER_SITE_OTHER): Payer: Self-pay

## 2018-08-26 DIAGNOSIS — D631 Anemia in chronic kidney disease: Secondary | ICD-10-CM | POA: Diagnosis not present

## 2018-08-26 DIAGNOSIS — T82898A Other specified complication of vascular prosthetic devices, implants and grafts, initial encounter: Secondary | ICD-10-CM | POA: Diagnosis not present

## 2018-08-26 DIAGNOSIS — Z992 Dependence on renal dialysis: Secondary | ICD-10-CM | POA: Diagnosis not present

## 2018-08-26 DIAGNOSIS — N2581 Secondary hyperparathyroidism of renal origin: Secondary | ICD-10-CM | POA: Diagnosis not present

## 2018-08-26 DIAGNOSIS — N186 End stage renal disease: Secondary | ICD-10-CM | POA: Diagnosis not present

## 2018-08-26 DIAGNOSIS — D509 Iron deficiency anemia, unspecified: Secondary | ICD-10-CM | POA: Diagnosis not present

## 2018-08-28 DIAGNOSIS — N186 End stage renal disease: Secondary | ICD-10-CM | POA: Diagnosis not present

## 2018-08-28 DIAGNOSIS — N2581 Secondary hyperparathyroidism of renal origin: Secondary | ICD-10-CM | POA: Diagnosis not present

## 2018-08-28 DIAGNOSIS — D631 Anemia in chronic kidney disease: Secondary | ICD-10-CM | POA: Diagnosis not present

## 2018-08-28 DIAGNOSIS — D509 Iron deficiency anemia, unspecified: Secondary | ICD-10-CM | POA: Diagnosis not present

## 2018-08-28 DIAGNOSIS — Z992 Dependence on renal dialysis: Secondary | ICD-10-CM | POA: Diagnosis not present

## 2018-08-28 DIAGNOSIS — T82898A Other specified complication of vascular prosthetic devices, implants and grafts, initial encounter: Secondary | ICD-10-CM | POA: Diagnosis not present

## 2018-08-29 DIAGNOSIS — T82898A Other specified complication of vascular prosthetic devices, implants and grafts, initial encounter: Secondary | ICD-10-CM | POA: Diagnosis not present

## 2018-08-29 DIAGNOSIS — D509 Iron deficiency anemia, unspecified: Secondary | ICD-10-CM | POA: Diagnosis not present

## 2018-08-29 DIAGNOSIS — Z992 Dependence on renal dialysis: Secondary | ICD-10-CM | POA: Diagnosis not present

## 2018-08-29 DIAGNOSIS — D631 Anemia in chronic kidney disease: Secondary | ICD-10-CM | POA: Diagnosis not present

## 2018-08-29 DIAGNOSIS — N2581 Secondary hyperparathyroidism of renal origin: Secondary | ICD-10-CM | POA: Diagnosis not present

## 2018-08-29 DIAGNOSIS — N186 End stage renal disease: Secondary | ICD-10-CM | POA: Diagnosis not present

## 2018-09-01 DIAGNOSIS — D509 Iron deficiency anemia, unspecified: Secondary | ICD-10-CM | POA: Diagnosis not present

## 2018-09-01 DIAGNOSIS — Z992 Dependence on renal dialysis: Secondary | ICD-10-CM | POA: Diagnosis not present

## 2018-09-01 DIAGNOSIS — T82898A Other specified complication of vascular prosthetic devices, implants and grafts, initial encounter: Secondary | ICD-10-CM | POA: Diagnosis not present

## 2018-09-01 DIAGNOSIS — N186 End stage renal disease: Secondary | ICD-10-CM | POA: Diagnosis not present

## 2018-09-01 DIAGNOSIS — N2581 Secondary hyperparathyroidism of renal origin: Secondary | ICD-10-CM | POA: Diagnosis not present

## 2018-09-01 DIAGNOSIS — D631 Anemia in chronic kidney disease: Secondary | ICD-10-CM | POA: Diagnosis not present

## 2018-09-02 DIAGNOSIS — T82898A Other specified complication of vascular prosthetic devices, implants and grafts, initial encounter: Secondary | ICD-10-CM | POA: Diagnosis not present

## 2018-09-02 DIAGNOSIS — N186 End stage renal disease: Secondary | ICD-10-CM | POA: Diagnosis not present

## 2018-09-02 DIAGNOSIS — N2581 Secondary hyperparathyroidism of renal origin: Secondary | ICD-10-CM | POA: Diagnosis not present

## 2018-09-02 DIAGNOSIS — D509 Iron deficiency anemia, unspecified: Secondary | ICD-10-CM | POA: Diagnosis not present

## 2018-09-02 DIAGNOSIS — Z992 Dependence on renal dialysis: Secondary | ICD-10-CM | POA: Diagnosis not present

## 2018-09-02 DIAGNOSIS — D631 Anemia in chronic kidney disease: Secondary | ICD-10-CM | POA: Diagnosis not present

## 2018-09-04 DIAGNOSIS — D631 Anemia in chronic kidney disease: Secondary | ICD-10-CM | POA: Diagnosis not present

## 2018-09-04 DIAGNOSIS — N186 End stage renal disease: Secondary | ICD-10-CM | POA: Diagnosis not present

## 2018-09-04 DIAGNOSIS — T82898A Other specified complication of vascular prosthetic devices, implants and grafts, initial encounter: Secondary | ICD-10-CM | POA: Diagnosis not present

## 2018-09-04 DIAGNOSIS — Z992 Dependence on renal dialysis: Secondary | ICD-10-CM | POA: Diagnosis not present

## 2018-09-04 DIAGNOSIS — D509 Iron deficiency anemia, unspecified: Secondary | ICD-10-CM | POA: Diagnosis not present

## 2018-09-04 DIAGNOSIS — N2581 Secondary hyperparathyroidism of renal origin: Secondary | ICD-10-CM | POA: Diagnosis not present

## 2018-09-06 DIAGNOSIS — T82898A Other specified complication of vascular prosthetic devices, implants and grafts, initial encounter: Secondary | ICD-10-CM | POA: Diagnosis not present

## 2018-09-06 DIAGNOSIS — D509 Iron deficiency anemia, unspecified: Secondary | ICD-10-CM | POA: Diagnosis not present

## 2018-09-06 DIAGNOSIS — D631 Anemia in chronic kidney disease: Secondary | ICD-10-CM | POA: Diagnosis not present

## 2018-09-06 DIAGNOSIS — N2581 Secondary hyperparathyroidism of renal origin: Secondary | ICD-10-CM | POA: Diagnosis not present

## 2018-09-06 DIAGNOSIS — Z992 Dependence on renal dialysis: Secondary | ICD-10-CM | POA: Diagnosis not present

## 2018-09-06 DIAGNOSIS — N186 End stage renal disease: Secondary | ICD-10-CM | POA: Diagnosis not present

## 2018-09-09 ENCOUNTER — Other Ambulatory Visit (INDEPENDENT_AMBULATORY_CARE_PROVIDER_SITE_OTHER): Payer: Self-pay | Admitting: Nurse Practitioner

## 2018-09-10 MED ORDER — CEFAZOLIN SODIUM-DEXTROSE 1-4 GM/50ML-% IV SOLN: 1.0000 g | Freq: Once | INTRAVENOUS | Status: DC

## 2018-09-11 ENCOUNTER — Ambulatory Visit: Admission: RE | Admit: 2018-09-11 | Payer: Medicare Other | Source: Ambulatory Visit | Admitting: Vascular Surgery

## 2018-09-11 ENCOUNTER — Encounter: Admission: RE | Payer: Self-pay | Source: Ambulatory Visit

## 2018-09-11 SURGERY — A/V SHUNTOGRAM
Anesthesia: Moderate Sedation | Laterality: Right

## 2018-09-12 ENCOUNTER — Other Ambulatory Visit: Payer: Self-pay

## 2018-09-12 ENCOUNTER — Inpatient Hospital Stay
Admission: EM | Admit: 2018-09-12 | Discharge: 2018-09-14 | DRG: 640 | Disposition: A | Discharge status: Home / Self Care | Payer: Medicare Other | Attending: Internal Medicine | Admitting: Internal Medicine

## 2018-09-12 DIAGNOSIS — D631 Anemia in chronic kidney disease: Secondary | ICD-10-CM | POA: Diagnosis present

## 2018-09-12 DIAGNOSIS — J449 Chronic obstructive pulmonary disease, unspecified: Secondary | ICD-10-CM | POA: Diagnosis present

## 2018-09-12 DIAGNOSIS — Z885 Allergy status to narcotic agent status: Secondary | ICD-10-CM

## 2018-09-12 DIAGNOSIS — K219 Gastro-esophageal reflux disease without esophagitis: Secondary | ICD-10-CM | POA: Diagnosis present

## 2018-09-12 DIAGNOSIS — E860 Dehydration: Secondary | ICD-10-CM | POA: Diagnosis present

## 2018-09-12 DIAGNOSIS — N186 End stage renal disease: Secondary | ICD-10-CM | POA: Diagnosis present

## 2018-09-12 DIAGNOSIS — I120 Hypertensive chronic kidney disease with stage 5 chronic kidney disease or end stage renal disease: Secondary | ICD-10-CM | POA: Diagnosis present

## 2018-09-12 DIAGNOSIS — Z7982 Long term (current) use of aspirin: Secondary | ICD-10-CM | POA: Diagnosis not present

## 2018-09-12 DIAGNOSIS — Z79899 Other long term (current) drug therapy: Secondary | ICD-10-CM

## 2018-09-12 DIAGNOSIS — N2581 Secondary hyperparathyroidism of renal origin: Secondary | ICD-10-CM | POA: Diagnosis present

## 2018-09-12 DIAGNOSIS — G4733 Obstructive sleep apnea (adult) (pediatric): Secondary | ICD-10-CM | POA: Diagnosis present

## 2018-09-12 DIAGNOSIS — Z992 Dependence on renal dialysis: Secondary | ICD-10-CM | POA: Diagnosis not present

## 2018-09-12 DIAGNOSIS — Z23 Encounter for immunization: Secondary | ICD-10-CM

## 2018-09-12 DIAGNOSIS — E875 Hyperkalemia: Secondary | ICD-10-CM | POA: Diagnosis present

## 2018-09-12 DIAGNOSIS — Z6839 Body mass index (BMI) 39.0-39.9, adult: Secondary | ICD-10-CM

## 2018-09-12 DIAGNOSIS — E871 Hypo-osmolality and hyponatremia: Principal | ICD-10-CM | POA: Diagnosis present

## 2018-09-12 DIAGNOSIS — E1140 Type 2 diabetes mellitus with diabetic neuropathy, unspecified: Secondary | ICD-10-CM | POA: Diagnosis present

## 2018-09-12 DIAGNOSIS — E1122 Type 2 diabetes mellitus with diabetic chronic kidney disease: Secondary | ICD-10-CM | POA: Diagnosis present

## 2018-09-12 DIAGNOSIS — Z794 Long term (current) use of insulin: Secondary | ICD-10-CM | POA: Diagnosis not present

## 2018-09-12 DIAGNOSIS — E785 Hyperlipidemia, unspecified: Secondary | ICD-10-CM | POA: Diagnosis present

## 2018-09-12 DIAGNOSIS — F1010 Alcohol abuse, uncomplicated: Secondary | ICD-10-CM | POA: Diagnosis present

## 2018-09-12 LAB — CBC WITH DIFFERENTIAL/PLATELET
ABS IMMATURE GRANULOCYTES: 0.02 10*3/uL (ref 0.00–0.07)
BASOS PCT: 0 %
Basophils Absolute: 0 10*3/uL (ref 0.0–0.1)
Eosinophils Absolute: 0 10*3/uL (ref 0.0–0.5)
Eosinophils Relative: 0 %
HCT: 36.8 % — ABNORMAL LOW (ref 39.0–52.0)
Hemoglobin: 12.8 g/dL — ABNORMAL LOW (ref 13.0–17.0)
Immature Granulocytes: 0 %
Lymphocytes Relative: 8 %
Lymphs Abs: 0.6 10*3/uL — ABNORMAL LOW (ref 0.7–4.0)
MCH: 30.8 pg (ref 26.0–34.0)
MCHC: 34.8 g/dL (ref 30.0–36.0)
MCV: 88.5 fL (ref 80.0–100.0)
MONO ABS: 0.5 10*3/uL (ref 0.1–1.0)
Monocytes Relative: 6 %
NEUTROS ABS: 6.4 10*3/uL (ref 1.7–7.7)
NRBC: 0 % (ref 0.0–0.2)
Neutrophils Relative %: 86 %
PLATELETS: 198 10*3/uL (ref 150–400)
RBC: 4.16 MIL/uL — AB (ref 4.22–5.81)
RDW: 12.2 % (ref 11.5–15.5)
WBC: 7.5 10*3/uL (ref 4.0–10.5)

## 2018-09-12 LAB — BASIC METABOLIC PANEL
Anion gap: 19 — ABNORMAL HIGH (ref 5–15)
Anion gap: 20 — ABNORMAL HIGH (ref 5–15)
BUN: 73 mg/dL — AB (ref 6–20)
BUN: 71 mg/dL — AB (ref 6–20)
CHLORIDE: 83 mmol/L — AB (ref 98–111)
CHLORIDE: 81 mmol/L — AB (ref 98–111)
CO2: 15 mmol/L — AB (ref 22–32)
CO2: 16 mmol/L — ABNORMAL LOW (ref 22–32)
CREATININE: 8.02 mg/dL — AB (ref 0.61–1.24)
Calcium: 9.1 mg/dL (ref 8.9–10.3)
Calcium: 9.1 mg/dL (ref 8.9–10.3)
Creatinine, Ser: 8.14 mg/dL — ABNORMAL HIGH (ref 0.61–1.24)
GFR calc Af Amer: 8 mL/min — ABNORMAL LOW (ref >60)
GFR calc Af Amer: 8 mL/min — ABNORMAL LOW (ref >60)
GFR calc non Af Amer: 7 mL/min — ABNORMAL LOW (ref >60)
GFR calc non Af Amer: 7 mL/min — ABNORMAL LOW (ref >60)
GLUCOSE: 299 mg/dL — AB (ref 70–99)
Glucose, Bld: 300 mg/dL — ABNORMAL HIGH (ref 70–99)
POTASSIUM: 6 mmol/L — AB (ref 3.5–5.1)
Potassium: 6.1 mmol/L — ABNORMAL HIGH (ref 3.5–5.1)
SODIUM: 117 mmol/L — AB (ref 135–145)
Sodium: 117 mmol/L — CL (ref 135–145)

## 2018-09-12 LAB — PHOSPHORUS: Phosphorus: 2.2 mg/dL — ABNORMAL LOW (ref 2.5–4.6)

## 2018-09-12 LAB — GLUCOSE, CAPILLARY: Glucose-Capillary: 297 mg/dL — ABNORMAL HIGH (ref 70–99)

## 2018-09-12 MED ORDER — LORAZEPAM 2 MG/ML IJ SOLN: 1.0000 mg | Freq: Four times a day (QID) | INTRAMUSCULAR | Status: DC | PRN

## 2018-09-12 MED ORDER — PENTAFLUOROPROP-TETRAFLUOROETH EX AERO
1.0000 "application " | INHALATION_SPRAY | CUTANEOUS | Status: DC | PRN
  Filled 2018-09-12: qty 30

## 2018-09-12 MED ORDER — PANTOPRAZOLE SODIUM 40 MG PO TBEC
40.0000 mg | DELAYED_RELEASE_TABLET | Freq: Every day | ORAL | Status: DC
  Administered 2018-09-12 – 2018-09-14 (×3): 40 mg via ORAL
  Filled 2018-09-12 (×3): qty 1

## 2018-09-12 MED ORDER — LORAZEPAM 1 MG PO TABS: 1.0000 mg | ORAL_TABLET | Freq: Four times a day (QID) | ORAL | Status: DC | PRN

## 2018-09-12 MED ORDER — CALCIUM ACETATE (PHOS BINDER) 667 MG PO CAPS
1334.0000 mg | ORAL_CAPSULE | Freq: Three times a day (TID) | ORAL | Status: DC
  Administered 2018-09-12 – 2018-09-14 (×6): 1334 mg via ORAL
  Filled 2018-09-12 (×6): qty 2

## 2018-09-12 MED ORDER — PATIROMER SORBITEX CALCIUM 8.4 G PO PACK
16.8000 g | PACK | Freq: Once | ORAL | Status: AC
  Administered 2018-09-12: 16.8 g via ORAL
  Filled 2018-09-12 (×2): qty 2

## 2018-09-12 MED ORDER — ADULT MULTIVITAMIN W/MINERALS CH
1.0000 | ORAL_TABLET | Freq: Every day | ORAL | Status: DC
  Administered 2018-09-12 – 2018-09-14 (×3): 1 via ORAL
  Filled 2018-09-12 (×3): qty 1

## 2018-09-12 MED ORDER — PNEUMOCOCCAL VAC POLYVALENT 25 MCG/0.5ML IJ INJ
0.5000 mL | INJECTION | INTRAMUSCULAR | Status: AC
  Administered 2018-09-13: 0.5 mL via INTRAMUSCULAR
  Filled 2018-09-12: qty 0.5

## 2018-09-12 MED ORDER — ALBUTEROL SULFATE (2.5 MG/3ML) 0.083% IN NEBU: 3.0000 mL | INHALATION_SOLUTION | RESPIRATORY_TRACT | Status: DC | PRN

## 2018-09-12 MED ORDER — FUROSEMIDE 40 MG PO TABS
80.0000 mg | ORAL_TABLET | Freq: Two times a day (BID) | ORAL | Status: DC
  Administered 2018-09-12 – 2018-09-14 (×4): 80 mg via ORAL
  Filled 2018-09-12 (×4): qty 2

## 2018-09-12 MED ORDER — HEPARIN SODIUM (PORCINE) 5000 UNIT/ML IJ SOLN
5000.0000 [IU] | Freq: Three times a day (TID) | INTRAMUSCULAR | Status: DC
  Administered 2018-09-12 – 2018-09-14 (×7): 5000 [IU] via SUBCUTANEOUS
  Filled 2018-09-12 (×7): qty 1

## 2018-09-12 MED ORDER — FOLIC ACID 1 MG PO TABS
1.0000 mg | ORAL_TABLET | Freq: Every day | ORAL | Status: DC
  Administered 2018-09-12 – 2018-09-14 (×3): 1 mg via ORAL
  Filled 2018-09-12 (×3): qty 1

## 2018-09-12 MED ORDER — ONDANSETRON HCL 4 MG/2ML IJ SOLN: 4.0000 mg | Freq: Four times a day (QID) | INTRAMUSCULAR | Status: DC | PRN

## 2018-09-12 MED ORDER — ACETAMINOPHEN 325 MG PO TABS: 650.0000 mg | ORAL_TABLET | Freq: Four times a day (QID) | ORAL | Status: DC | PRN

## 2018-09-12 MED ORDER — SODIUM CHLORIDE 0.9 % IV SOLN
INTRAVENOUS | Status: DC
  Administered 2018-09-12 – 2018-09-13 (×2): via INTRAVENOUS

## 2018-09-12 MED ORDER — ONDANSETRON HCL 4 MG PO TABS: 4.0000 mg | ORAL_TABLET | Freq: Four times a day (QID) | ORAL | Status: DC | PRN

## 2018-09-12 MED ORDER — ASPIRIN EC 81 MG PO TBEC
81.0000 mg | DELAYED_RELEASE_TABLET | Freq: Every day | ORAL | Status: DC
  Administered 2018-09-12 – 2018-09-14 (×3): 81 mg via ORAL
  Filled 2018-09-12 (×3): qty 1

## 2018-09-12 MED ORDER — ACETAMINOPHEN 650 MG RE SUPP: 650.0000 mg | Freq: Four times a day (QID) | RECTAL | Status: DC | PRN

## 2018-09-12 MED ORDER — INSULIN DETEMIR 100 UNIT/ML ~~LOC~~ SOLN
25.0000 [IU] | Freq: Two times a day (BID) | SUBCUTANEOUS | Status: DC
  Administered 2018-09-13 – 2018-09-14 (×4): 25 [IU] via SUBCUTANEOUS
  Filled 2018-09-12 (×7): qty 0.25

## 2018-09-12 MED ORDER — METOPROLOL TARTRATE 25 MG PO TABS
25.0000 mg | ORAL_TABLET | Freq: Two times a day (BID) | ORAL | Status: DC
Start: 2018-09-12 — End: 2018-09-14
  Administered 2018-09-13 – 2018-09-14 (×3): 25 mg via ORAL
  Filled 2018-09-12 (×4): qty 1

## 2018-09-12 MED ORDER — AMITRIPTYLINE HCL 25 MG PO TABS
25.0000 mg | ORAL_TABLET | Freq: Every day | ORAL | Status: DC
  Administered 2018-09-12 – 2018-09-13 (×2): 25 mg via ORAL
  Filled 2018-09-12 (×3): qty 1

## 2018-09-12 MED ORDER — GABAPENTIN 300 MG PO CAPS
300.0000 mg | ORAL_CAPSULE | Freq: Every day | ORAL | Status: DC
  Administered 2018-09-12 – 2018-09-14 (×3): 300 mg via ORAL
  Filled 2018-09-12 (×3): qty 1

## 2018-09-12 MED ORDER — MUPIROCIN 2 % EX OINT: 1.0000 "application " | TOPICAL_OINTMENT | Freq: Two times a day (BID) | CUTANEOUS | Status: DC

## 2018-09-12 MED ORDER — ALBUMIN HUMAN 25 % IV SOLN
12.5000 g | Freq: Once | INTRAVENOUS | Status: AC
  Administered 2018-09-13: 12.5 g via INTRAVENOUS
  Filled 2018-09-12: qty 50

## 2018-09-12 MED ORDER — OMEPRAZOLE MAGNESIUM 20 MG PO TBEC: 20.0000 mg | DELAYED_RELEASE_TABLET | Freq: Every day | ORAL | Status: DC

## 2018-09-12 MED ORDER — NEPRO/CARBSTEADY PO LIQD
237.0000 mL | Freq: Two times a day (BID) | ORAL | Status: DC
  Administered 2018-09-13 – 2018-09-14 (×3): 237 mL via ORAL

## 2018-09-12 MED ORDER — PATIROMER SORBITEX CALCIUM 8.4 G PO PACK: 16.8000 g | PACK | Freq: Every day | ORAL | Status: DC

## 2018-09-12 MED ORDER — INSULIN ASPART 100 UNIT/ML ~~LOC~~ SOLN
6.0000 [IU] | Freq: Three times a day (TID) | SUBCUTANEOUS | Status: DC
  Administered 2018-09-12 – 2018-09-13 (×2): 6 [IU] via SUBCUTANEOUS
  Filled 2018-09-12 (×3): qty 1

## 2018-09-12 MED ORDER — VITAMIN B-1 100 MG PO TABS
100.0000 mg | ORAL_TABLET | Freq: Every day | ORAL | Status: DC
  Administered 2018-09-12 – 2018-09-14 (×3): 100 mg via ORAL
  Filled 2018-09-12 (×3): qty 1

## 2018-09-12 MED ORDER — MOMETASONE FURO-FORMOTEROL FUM 100-5 MCG/ACT IN AERO
2.0000 | INHALATION_SPRAY | Freq: Two times a day (BID) | RESPIRATORY_TRACT | Status: DC
  Administered 2018-09-12 – 2018-09-14 (×4): 2 via RESPIRATORY_TRACT
  Filled 2018-09-12: qty 8.8

## 2018-09-12 MED ORDER — CHLORHEXIDINE GLUCONATE CLOTH 2 % EX PADS
6.0000 | MEDICATED_PAD | Freq: Every day | CUTANEOUS | Status: DC
  Filled 2018-09-12: qty 6

## 2018-09-12 MED ORDER — THIAMINE HCL 100 MG/ML IJ SOLN
100.0000 mg | Freq: Every day | INTRAMUSCULAR | Status: DC
  Filled 2018-09-12: qty 2

## 2018-09-12 MED ORDER — GLIPIZIDE 5 MG PO TABS
5.0000 mg | ORAL_TABLET | Freq: Every day | ORAL | Status: DC
  Administered 2018-09-13 – 2018-09-14 (×2): 5 mg via ORAL
  Filled 2018-09-12 (×3): qty 1

## 2018-09-12 NOTE — Progress Notes (Signed)
Pre HD Treatment    09/12/18 1900  Neurological  Level of Consciousness Alert  Orientation Level Oriented X4  Respiratory  Respiratory Pattern Regular;Unlabored;Symmetrical  Chest Assessment Chest expansion symmetrical  Bilateral Breath Sounds Diminished  Cardiac  Pulse Regular  Heart Sounds S1, S2  Jugular Venous Distention (JVD) No  ECG Monitor Yes  Cardiac Rhythm NSR  Vascular  R Radial Pulse +2  L Radial Pulse +2  R Dorsalis Pedis Pulse +2  L Dorsalis Pedis Pulse +2  Edema Right lower extremity;Left lower extremity  RLE Edema +2  LLE Edema +2  Integumentary  Integumentary (WDL) WDL  Skin Color Appropriate for ethnicity  Skin Condition Dry  Skin Integrity Intact  Musculoskeletal  Musculoskeletal (WDL) WDL  GU Assessment  Genitourinary (WDL) X (HD pt)  Genitourinary Symptoms Oliguria  Psychosocial  Psychosocial (WDL) WDL

## 2018-09-12 NOTE — H&P (Signed)
[facility] [place] NAME: Steven [name]    MR#:  [id number]  DATE OF BIRTH:  May 10, 1961  DATE OF ADMISSION:  09/12/2018  PRIMARY CARE PHYSICIAN: [name], MD   REQUESTING/REFERRING PHYSICIAN: Dr. Nance Pear  CHIEF COMPLAINT:  No chief complaint on file. Hyponatremia  HISTORY OF PRESENT ILLNESS:  Steven [name]  is a 57 y.o. male with a known history of end-stage renal disease on hemodialysis Tuesday Thursday Saturday, alcohol abuse, diabetes, GERD, hyperlipidemia, hypertension, obstructive sleep apnea, neuropathy who presents to the hospital due to dizziness, weakness and noted to be hyponatremic.  Patient says that he drinks about a 40 ounce and a half beer every day and his last drink was last night.  He went for dialysis yesterday but they could not access his fistula and he was sent home and told to come today for dialysis, while he was at dialysis they thought he was more altered and was having some tremor and therefore was sent to the ER.  In the emergency room patient underwent blood work which showed that his sodium was 117, it was repeated and it still remained low and therefore hospitalist services were contacted for admission.  Patient denies any chest pains, shortness of breath, admits to some intermittent nausea but no vomiting.  He denies any abdominal pain diarrhea melena hematochezia or any other associated symptoms.  He does say that he has had a poor appetite while he has been drinking alcohol.  PAST MEDICAL HISTORY:   Past Medical History:  Diagnosis Date  . Anemia   . Cellulitis and abscess of right lower extremity 03/01/2017  . Chronic kidney disease    dialysis m,w,f  . Diabetes mellitus without complication ([place])   . Edema extremities 03/01/2017   bilateral swelling  . GERD (gastroesophageal reflux disease)   . High cholesterol   . High triglycerides   . Hypertension   . Neuropathy   . Sleep apnea    can't use cpap d/t feelings of suffocation    PAST SURGICAL HISTORY:   Past Surgical History:  Procedure Laterality Date  . A/V FISTULAGRAM Right 08/27/2017   Procedure: A/V FISTULAGRAM;  Surgeon: Katha Cabal, MD;  Location: [facility] [facility];  Service: Cardiovascular;  Laterality: Right;  . A/V SHUNT INTERVENTION N/A 06/23/2018   Procedure: A/V SHUNT INTERVENTION;  Surgeon: Algernon Huxley, MD;  Location: [place];  Service: Cardiovascular;  Laterality: N/A;  . APPENDECTOMY  2010  . AV FISTULA PLACEMENT Right 06/14/2017   Procedure: ARTERIOVENOUS (AV) FISTULA CREATION WRIST;  Surgeon: Katha Cabal, MD;  Location: [facility];  Service: Vascular;  Laterality: Right;  . DIALYSIS/PERMA CATHETER INSERTION N/A 03/05/2017   Procedure: Dialysis/Perma Catheter Insertion;  Surgeon: Katha Cabal, MD;  Location: [place] [facility];  Service: Cardiovascular;  Laterality: N/A;  . DIALYSIS/PERMA CATHETER INSERTION N/A 09/20/2017   Procedure: DIALYSIS/PERMA CATHETER INSERTION;  Surgeon: Katha Cabal, MD;  Location: [place] [facility];  Service: Cardiovascular;  Laterality: N/A;  . EXCHANGE OF A DIALYSIS CATHETER  03/29/2017   Procedure: Exchange Of A Dialysis Catheter;  Surgeon: Katha Cabal, MD;  Location: [place] [facility];  Service: Cardiovascular;;  . IRRIGATION AND DEBRIDEMENT FOOT Right 03/01/2017   Procedure: IRRIGATION AND DEBRIDEMENT FOOT;  Surgeon: Albertine Patricia, DPM;  Location: [facility];  Service: Podiatry;  Laterality: Right;  application of wound vac  . PERIPHERAL VASCULAR THROMBECTOMY Right 06/18/2018  Procedure: PERIPHERAL VASCULAR THROMBECTOMY;  Surgeon: Algernon Huxley, MD;  Location: [place] [facility];  Service: Cardiovascular;  Laterality: Right;    SOCIAL HISTORY:   Social History   Tobacco Use  . Smoking status: Never Smoker  . Smokeless tobacco: Current User    Types: Snuff  Substance Use Topics  . Alcohol use: Yes    Comment: up  until 01/2017 was heavy drinker...4 40 oz qd    FAMILY HISTORY:   Family History  Problem Relation Age of Onset  . CAD Father   . Heart disease Father     DRUG ALLERGIES:   Allergies  Allergen Reactions  . Codeine Nausea And Vomiting    REVIEW OF SYSTEMS:   Review of Systems  Constitutional: Negative for fever and weight loss.  HENT: Negative for congestion, nosebleeds and tinnitus.   Eyes: Negative for blurred vision, double vision and redness.  Respiratory: Negative for cough, hemoptysis and shortness of breath.   Cardiovascular: Negative for chest pain, orthopnea, leg swelling and PND.  Gastrointestinal: Negative for abdominal pain, diarrhea, melena, nausea and vomiting.  Genitourinary: Negative for dysuria, hematuria and urgency.  Musculoskeletal: Negative for falls and joint pain.  Neurological: Positive for dizziness and weakness. Negative for tingling, sensory change, focal weakness, seizures and headaches.  Endo/Heme/Allergies: Negative for polydipsia. Does not bruise/bleed easily.  Psychiatric/Behavioral: Negative for depression and memory loss. The patient is not nervous/anxious.     MEDICATIONS AT HOME:   Prior to Admission medications   Medication Sig Start Date End Date Taking? Authorizing Provider  amitriptyline (ELAVIL) 25 MG tablet Take 25 mg by mouth at bedtime.   Yes [provider]  aspirin EC 81 MG tablet TAKE ONE  BY MOUTH EVERY DAY 09/15/12  Yes Alveda Reasons, MD  Aspirin-Acetaminophen-Caffeine (GOODY HEADACHE PO) Take 1 packet by mouth as needed.   Yes [provider]  calcium acetate (PHOSLO) 667 MG capsule Take 2 capsules (1,334 mg total) by mouth 3 (three) times daily with meals. 06/24/18  Yes Salary, Avel Peace, MD  furosemide (LASIX) 80 MG tablet Take 1 tablet (80 mg total) by mouth daily. Patient taking differently: Take 80 mg by mouth 2 (two) times daily.  03/08/17  Yes Vaughan Basta, MD  gabapentin (NEURONTIN) 300 MG  capsule Take 1 capsule (300 mg total) by mouth 2 (two) times daily. Patient taking differently: Take 300 mg by mouth daily.  03/07/17  Yes Vaughan Basta, MD  glipiZIDE (GLUCOTROL) 5 MG tablet 5 mg daily before breakfast.  07/05/18  Yes [provider]  glucose blood (ACCU-CHEK AVIVA) test strip Use as instructed.use to check blood sugar up to 3 times a day  Disp: 1 box 08/20/14  Yes Alveda Reasons, MD  insulin aspart (NOVOLOG) 100 UNIT/ML injection Inject 0-15 Units into the skin 3 (three) times daily with meals. Patient taking differently: Inject 15-20 Units into the skin 3 (three) times daily with meals.  09/20/17  Yes Epifanio Lesches, MD  insulin detemir (LEVEMIR) 100 UNIT/ML injection Inject 0.25 mLs (25 Units total) into the skin 2 (two) times daily. 09/21/17  Yes Epifanio Lesches, MD  Insulin Pen Needle 31G X 8 MM MISC 1 Container by Does not apply route QID. qid--any brand 08/20/14  Yes Alveda Reasons, MD  Insulin Syringes, Disposable, U-100 1 ML MISC Use as directed with insulin 08/20/14  Yes Alveda Reasons, MD  Lancets (ACCU-CHEK MULTICLIX) lancets Use as instructed.  check blood sugar up to 3 times  a day Disp:  1 box 08/20/14  Yes Alveda Reasons, MD  metoprolol tartrate (LOPRESSOR) 25 MG tablet Take 1 tablet (25 mg total) by mouth 2 (two) times daily. 03/07/17  Yes Vaughan Basta, MD  midodrine (PROAMATINE) 5 MG tablet Take 5 mg by mouth daily as needed.  07/05/18  Yes [provider]  omeprazole (PRILOSEC OTC) 20 MG tablet Take 20 mg by mouth daily.    Yes [provider]  PROAIR HFA 108 (90 Base) MCG/ACT inhaler Inhale 2 puffs into the lungs every 4 (four) hours as needed for wheezing or shortness of breath.  09/04/17  Yes [provider]  SYMBICORT 80-4.5 MCG/ACT inhaler Inhale 2 puffs into the lungs daily.  09/10/17  Yes [provider]  insulin aspart (NOVOLOG) 100 UNIT/ML injection Inject 0-5 Units into the  skin at bedtime. 09/20/17   Epifanio Lesches, MD  insulin aspart (NOVOLOG) 100 UNIT/ML injection Inject 6 Units into the skin 3 (three) times daily with meals. 09/20/17   Epifanio Lesches, MD  multivitamin (RENA-VIT) TABS tablet Take 1 tablet by mouth at bedtime. Patient not taking: Reported on 09/12/2018 06/24/18   Salary, Avel Peace, MD  Nutritional Supplements (FEEDING SUPPLEMENT, NEPRO CARB STEADY,) LIQD Take 237 mLs by mouth 2 (two) times daily between meals. 06/24/18   Salary, Avel Peace, MD      VITAL SIGNS:  Blood pressure (!) 129/93, pulse 74, temperature 97.7 F (36.5 C), temperature source Oral, resp. rate 17, weight 125.5 kg, SpO2 100 %.  PHYSICAL EXAMINATION:  Physical Exam  GENERAL:  57 y.o.-year-old obese patient lying in the bed in no acute distress.  EYES: Pupils equal, round, reactive to light and accommodation. No scleral icterus. Extraocular muscles intact.  HEENT: Head atraumatic, normocephalic. Oropharynx and nasopharynx clear. No oropharyngeal erythema, moist oral mucosa  NECK:  Supple, no jugular venous distention. No thyroid enlargement, no tenderness.  LUNGS: Normal breath sounds bilaterally, no wheezing, rales, rhonchi. No use of accessory muscles of respiration.  CARDIOVASCULAR: S1, S2 RRR. No murmurs, rubs, gallops, clicks.  ABDOMEN: Soft, nontender, nondistended. Bowel sounds present. No organomegaly or mass.  EXTREMITIES: No pedal edema, cyanosis, or clubbing. + 2 pedal & radial pulses b/l.   NEUROLOGIC: Cranial nerves II through XII are intact. No focal Motor or sensory deficits appreciated b/l. Globally weak.  PSYCHIATRIC: The patient is alert and oriented x 3. Good affect.  SKIN: No obvious rash, lesion, or ulcer.   Right upper extremity AV fistula with good bruit and thrill.  LABORATORY PANEL:   CBC Recent Labs  Lab 09/12/18 1250  WBC 7.5  HGB 12.8*  HCT 36.8*  PLT 198    ------------------------------------------------------------------------------------------------------------------  Chemistries  Recent Labs  Lab 09/12/18 1250  NA 117*  K 6.1*  CL 81*  CO2 16*  GLUCOSE 300*  BUN 71*  CREATININE 8.14*  CALCIUM 9.1   ------------------------------------------------------------------------------------------------------------------  Cardiac Enzymes No results for input(s): TROPONINI in the last 168 hours. ------------------------------------------------------------------------------------------------------------------  RADIOLOGY:  No results found.   IMPRESSION AND PLAN:   57 year old male with past medical history of end-stage renal disease on hemodialysis, diabetes, diabetic neuropathy, hypertension, hyperlipidemia, obstructive sleep apnea, alcohol abuse who presents to the hospital due to weakness, dizziness and noted to be hyponatremic.  1.  Acute hyponatremia- secondary to dehydration/beer portal mania. - We will gently hydrate the patient with IV fluids, patient will also get hemodialysis to help correct his sodium to get her to some free water. - Follow sodium closely.  Nephrology has been consulted.  2.  Hyperkalemia-secondary to patient's ESRD and patient missing dialysis yesterday.   -discussed with nephrology and patient to get dialysis today.  No acute EKG changes.  3.  End-stage renal disease on hemodialysis-patient gets dialysis on Tuesday Thursday Saturday, he missed his dialysis yesterday. -Nephrology has been consulted.  Plan for dialysis later today.  4.  Alcohol abuse- patient last drink was yesterday.  He is at high risk for going into alcohol withdrawal. - We will place on CIWA protocol.  5.  Essential hypertension-continue metoprolol.  6.  Diabetes type 2 without complication- continue NovoLog with meals will also place on sliding scale insulin -Continue Levemir.  7.  Diabetic neuropathy-continue gabapentin.  8.   COPD-no acute exacerbation-continue Symbicort, albuterol inhaler as needed.  All the records are reviewed and case discussed with ED provider. Management plans discussed with the patient, family and they are in agreement.  CODE STATUS: Full code  TOTAL TIME TAKING CARE OF THIS PATIENT: 45 minutes.    Henreitta Leber M.D on 09/12/2018 at 2:17 PM  Between 7am to 6pm - Pager - [phone]  After 6pm go to [email or web address] - password [email or web address] [facility]  Office  [phone]  CC: Primary care physician; [name], MD

## 2018-09-12 NOTE — Progress Notes (Signed)
Post HD Treatment    09/12/18 2310  Neurological  Level of Consciousness Alert  Orientation Level Oriented X4  Respiratory  Respiratory Pattern Regular;Unlabored;Dyspnea with exertion;Symmetrical  Chest Assessment Chest expansion symmetrical  Bilateral Breath Sounds Diminished  Cardiac  Pulse Regular  Heart Sounds S1, S2  Jugular Venous Distention (JVD) No  ECG Monitor Yes  Cardiac Rhythm NSR  Vascular  R Radial Pulse +2  L Radial Pulse +2  R Dorsalis Pedis Pulse +2  L Dorsalis Pedis Pulse +2  Edema Right lower extremity;Left lower extremity  RLE Edema +2  LLE Edema +2  Integumentary  Integumentary (WDL) WDL  Skin Color Appropriate for ethnicity  Skin Condition Dry  Skin Integrity Intact  Musculoskeletal  Musculoskeletal (WDL) WDL  GU Assessment  Genitourinary (WDL) X (HD pt)  Genitourinary Symptoms Oliguria  Psychosocial  Psychosocial (WDL) WDL

## 2018-09-12 NOTE — ED Notes (Signed)
Lab will draw repeat BMP.

## 2018-09-12 NOTE — ED Provider Notes (Signed)
[facility] Emergency Department Provider Note   ____________________________________________   I have reviewed the triage vital signs and the nursing notes.   HISTORY  Chief Complaint Dialysis did not occur  History limited by: Not Limited   HPI Steven [name] is a 57 y.o. male who presents to the emergency department today because of concerns from the dialysis center that he was too inebriated or perhaps too unstable to undergo dialysis today.  The patient states that he does not drink alcohol and had 1 and 1/2 40s last night.  He states that he has been trying to take himself down on his alcohol levels.  He does state that he has not had dialysis yet this week.  He states that they have not been able to perform dialysis and he thinks that they might be missing his graft. The patient states that he does feel a little dizzy currently but otherwise without any complaints.    Per medical record review patient has a history of CKD, on dialysis.   Past Medical History:  Diagnosis Date  . Anemia   . Cellulitis and abscess of right lower extremity 03/01/2017  . Chronic kidney disease    dialysis m,w,f  . Diabetes mellitus without complication ([place])   . Edema extremities 03/01/2017   bilateral swelling  . GERD (gastroesophageal reflux disease)   . High cholesterol   . High triglycerides   . Hypertension   . Neuropathy   . Sleep apnea    can't use cpap d/t feelings of suffocation    Patient Active Problem List   Diagnosis Date Noted  . DKA (diabetic ketoacidoses) ([place]) 09/18/2017  . End stage renal disease ([place]) 04/07/2017  . Complication of renal dialysis 04/07/2017  . Cellulitis in diabetic foot ([place]) 02/27/2017  . Acute on chronic renal failure ([place]) 02/27/2017  . Cellulitis 02/27/2017  . DIABETIC  RETINOPATHY 03/03/2009  . SLEEP APNEA 01/24/2009  . EDEMA 12/16/2008  . Diabetes mellitus type 2 in obese ([place]) 02/19/2008  . Hyperlipidemia  02/19/2008  . Morbid obesity ([place]) 02/19/2008  . DEPRESSION 02/19/2008  . Essential hypertension 06/17/2007  . GERD 06/17/2007    Past Surgical History:  Procedure Laterality Date  . A/V FISTULAGRAM Right 08/27/2017   Procedure: A/V FISTULAGRAM;  Surgeon: Katha Cabal, MD;  Location: [place] [facility];  Service: Cardiovascular;  Laterality: Right;  . A/V SHUNT INTERVENTION N/A 06/23/2018   Procedure: A/V SHUNT INTERVENTION;  Surgeon: Algernon Huxley, MD;  Location: [place] [facility];  Service: Cardiovascular;  Laterality: N/A;  . APPENDECTOMY  2010  . AV FISTULA PLACEMENT Right 06/14/2017   Procedure: ARTERIOVENOUS (AV) FISTULA CREATION WRIST;  Surgeon: Katha Cabal, MD;  Location: [facility];  Service: Vascular;  Laterality: Right;  . DIALYSIS/PERMA CATHETER INSERTION N/A 03/05/2017   Procedure: Dialysis/Perma Catheter Insertion;  Surgeon: Katha Cabal, MD;  Location: [place] [facility];  Service: Cardiovascular;  Laterality: N/A;  . DIALYSIS/PERMA CATHETER INSERTION N/A 09/20/2017   Procedure: DIALYSIS/PERMA CATHETER INSERTION;  Surgeon: Katha Cabal, MD;  Location: [place] [facility];  Service: Cardiovascular;  Laterality: N/A;  . EXCHANGE OF A DIALYSIS CATHETER  03/29/2017   Procedure: Exchange Of A Dialysis Catheter;  Surgeon: Katha Cabal, MD;  Location: [place] [facility];  Service: Cardiovascular;;  . IRRIGATION AND DEBRIDEMENT FOOT Right 03/01/2017   Procedure: IRRIGATION AND DEBRIDEMENT FOOT;  Surgeon: Albertine Patricia, DPM;  Location: [facility];  Service: Podiatry;  Laterality: Right;  application of wound vac  . PERIPHERAL VASCULAR THROMBECTOMY Right 06/18/2018   Procedure: PERIPHERAL VASCULAR THROMBECTOMY;  Surgeon: Algernon Huxley, MD;  Location: [place] [facility];  Service: Cardiovascular;  Laterality: Right;    Prior to Admission medications   Medication Sig Start Date End Date Taking? Authorizing Provider  amitriptyline (ELAVIL) 25 MG tablet  Take 25 mg by mouth at bedtime.    [provider]  aspirin EC 81 MG tablet TAKE ONE  BY MOUTH EVERY DAY 09/15/12   Alveda Reasons, MD  calcium acetate (PHOSLO) 667 MG capsule Take 2 capsules (1,334 mg total) by mouth 3 (three) times daily with meals. 06/24/18   Salary, Avel Peace, MD  furosemide (LASIX) 80 MG tablet Take 1 tablet (80 mg total) by mouth daily. Patient taking differently: Take 80 mg by mouth 2 (two) times daily.  03/08/17   Vaughan Basta, MD  gabapentin (NEURONTIN) 300 MG capsule Take 1 capsule (300 mg total) by mouth 2 (two) times daily. 03/07/17   Vaughan Basta, MD  glipiZIDE (GLUCOTROL) 5 MG tablet TK 1 T PO QD 07/05/18   [provider]  glucose blood (ACCU-CHEK AVIVA) test strip Use as instructed.use to check blood sugar up to 3 times a day  Disp: 1 box 08/20/14   Alveda Reasons, MD  insulin aspart (NOVOLOG) 100 UNIT/ML injection Inject 0-5 Units into the skin at bedtime. 09/20/17   Epifanio Lesches, MD  insulin aspart (NOVOLOG) 100 UNIT/ML injection Inject 6 Units into the skin 3 (three) times daily with meals. 09/20/17   Epifanio Lesches, MD  insulin aspart (NOVOLOG) 100 UNIT/ML injection Inject 0-15 Units into the skin 3 (three) times daily with meals. 09/20/17   Epifanio Lesches, MD  insulin detemir (LEVEMIR) 100 UNIT/ML injection Inject 0.25 mLs (25 Units total) into the skin 2 (two) times daily. 09/21/17   Epifanio Lesches, MD  Insulin Pen Needle 31G X 8 MM MISC 1 Container by Does not apply route QID. qid--any brand 08/20/14   Alveda Reasons, MD  Insulin Syringes, Disposable, U-100 1 ML MISC Use as directed with insulin 08/20/14   Alveda Reasons, MD  Lancets (ACCU-CHEK MULTICLIX) lancets Use as instructed.  check blood sugar up to 3 times a day Disp:  1 box 08/20/14   Alveda Reasons, MD  metoprolol tartrate (LOPRESSOR) 25 MG tablet Take 1 tablet (25 mg total) by mouth 2 (two) times daily. 03/07/17   Vaughan Basta, MD  midodrine (PROAMATINE) 5 MG tablet TK 1 T PO TID 07/05/18   [provider]  multivitamin (RENA-VIT) TABS tablet Take 1 tablet by mouth at bedtime. 06/24/18   Salary, Avel Peace, MD  Nutritional Supplements (FEEDING SUPPLEMENT, NEPRO CARB STEADY,) LIQD Take 237 mLs by mouth 2 (two) times daily between meals. 06/24/18   Salary, Avel Peace, MD  omeprazole (PRILOSEC OTC) 20 MG tablet Take 20 mg every other day by mouth.    [provider]  PROAIR HFA 108 [id number] Base) MCG/ACT inhaler Inhale 2 puffs into the lungs every 4 (four) hours as needed for wheezing or shortness of breath.  09/04/17   [provider]  SYMBICORT 80-4.5 MCG/ACT inhaler Inhale 1 puff into the lungs 2 (two) times daily.  09/10/17   [provider]    Allergies Codeine  Family History  Problem Relation Age of Onset  . CAD Father     Social History Social History   Tobacco Use  . Smoking status: Never Smoker  .  Smokeless tobacco: Current User    Types: Snuff  Substance Use Topics  . 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 Drug use: No    Review of Systems Constitutional: No fever/chills Eyes: No visual changes. ENT: No sore throat. Cardiovascular: Denies chest pain. Respiratory: Denies shortness of breath. Gastrointestinal: No abdominal pain.  No nausea, no vomiting.  No diarrhea.   Genitourinary: Negative for dysuria. Musculoskeletal: Negative for back pain. Skin: Negative for rash. Neurological: Positive for dizziness.  ____________________________________________   PHYSICAL EXAM:  VITAL SIGNS: ED Triage Vitals  Enc Vitals Group     BP 09/12/18 1116 (!) 129/93     Pulse Rate 09/12/18 1119 73     Resp 09/12/18 1119 12     Temp 09/12/18 1120 97.7 F (36.5 C)     Temp Source 09/12/18 1120 Oral     SpO2 09/12/18 1119 99 %     Weight 09/12/18 1110 276 lb 10.8 oz (125.5 kg)   Constitutional: Alert and oriented. Does  not appear inebriated.  Eyes: Conjunctivae are normal.  ENT      Head: Normocephalic and atraumatic.      Nose: No congestion/rhinnorhea.      Mouth/Throat: Mucous membranes are moist.      Neck: No stridor. Hematological/Lymphatic/Immunilogical: No cervical lymphadenopathy. Cardiovascular: Normal rate, regular rhythm.  No murmurs, rubs, or gallops.  Respiratory: Normal respiratory effort without tachypnea nor retractions. Breath sounds are clear and equal bilaterally. No wheezes/rales/rhonchi. Gastrointestinal: Soft and non tender. No rebound. No guarding.  Genitourinary: Deferred Musculoskeletal: Normal range of motion in all extremities. No lower extremity edema. Neurologic:  Normal speech and language. No gross focal neurologic deficits are appreciated.  Skin:  Skin is warm, dry and intact. No rash noted. Psychiatric: Mood and affect are normal. Speech and behavior are normal. Patient exhibits appropriate insight and judgment.  ____________________________________________    LABS (pertinent positives/negatives)  CBC wbc 7.5, hgb 12.8, plt 198 BMP na 117, k 6.1, glu 300, cr 8.14  ____________________________________________   EKG  None  ____________________________________________    RADIOLOGY  None  ____________________________________________   PROCEDURES  Procedures  ____________________________________________   INITIAL IMPRESSION / ASSESSMENT AND PLAN / ED COURSE  Pertinent labs & imaging results that were available during my care of the patient were reviewed by me and considered in my medical decision making (see chart for details).   Patient presented here from dialysis because they were unable to do dialysis today because did not feel comfortable given that the patient was either inebriated or going through withdrawals.  Patient's only complaint is some dizziness.  Patient's blood work was checked and was noted to have hyponatremia.  This was double  checked and he continued to show the same hyponatremia.  Will plan on admission. Discussed findings with patient.   ____________________________________________   FINAL CLINICAL IMPRESSION(S) / ED DIAGNOSES  Final diagnoses:  Hyponatremia     Note: This dictation was prepared with Dragon dictation. Any transcriptional errors that result from this process are unintentional     Nance Pear, MD 09/12/18 1410

## 2018-09-12 NOTE — Progress Notes (Signed)
HD Treatment Initiated    09/12/18 1952  Vital Signs  Resp 15  During Hemodialysis Assessment  Blood Flow Rate (mL/min) 300 mL/min  Arterial Pressure (mmHg) -160 mmHg  Venous Pressure (mmHg) 120 mmHg  Transmembrane Pressure (mmHg) 60 mmHg  Ultrafiltration Rate (mL/min) 600 mL/min  Dialysate Flow Rate (mL/min) 600 ml/min  Conductivity: Machine  13.6  HD Safety Checks Performed Yes  Dialysis Fluid Bolus Normal Saline  Bolus Amount (mL) 250 mL  Intra-Hemodialysis Comments Tx initiated;Progressing as prescribed  Fistula / Graft Right Forearm Arteriovenous fistula  Placement Date/Time: 06/14/17 1230   Orientation: Right  Access Location: Forearm  Access Type: Arteriovenous fistula  Status Accessed  Needle Size 15

## 2018-09-12 NOTE — Progress Notes (Signed)
HD Treatment Complete    09/12/18 2300  Vital Signs  Pulse Rate 82  Pulse Rate Source Monitor  Resp 15  BP 109/83  BP Location Left Arm  BP Method Automatic  Patient Position (if appropriate) Lying  Oxygen Therapy  SpO2 100 %  O2 Device Room Air  During Hemodialysis Assessment  Blood Flow Rate (mL/min) 250 mL/min  Arterial Pressure (mmHg) -110 mmHg  Venous Pressure (mmHg) 160 mmHg  Transmembrane Pressure (mmHg) 60 mmHg  Ultrafiltration Rate (mL/min) 290 mL/min  Dialysate Flow Rate (mL/min) 600 ml/min  Conductivity: Machine  15  HD Safety Checks Performed Yes  Intra-Hemodialysis Comments See progress note;Tx completed (UF 636)  Fistula / Graft Right Forearm Arteriovenous fistula  Placement Date/Time: 06/14/17 1230   Orientation: Right  Access Location: Forearm  Access Type: Arteriovenous fistula  Status Deaccessed

## 2018-09-12 NOTE — ED Triage Notes (Signed)
According to diaylsis pt is inebriated to the point of unable to walk straight trouble keeping eyes open. Pt states he drank 2 40's last night before 10 pm. Pt has a history of hard liquor. Pt is less stable today than yesterday

## 2018-09-12 NOTE — ED Notes (Signed)
EDP aware of results. Orders to repeat BMP.

## 2018-09-12 NOTE — Progress Notes (Signed)
Post HD Treatment  Pt did not tolerate removal of fluid during dialysis. He experienced several episodes of hypotension. MD aware. MD ordered UF off and decrease in BFR which did help some throughout the treatment. Patient's system clotted in the middle of treatment and was unable to rinseback venous line. MD aware. MD ordered Albumin for blood pressure support, but Pharmacy failed to deliver, and patient's blood pressure returned back to baseline. Patient's net UF was  -489, and MD is aware.     09/12/18 2305  Hand-Off documentation  Report given to (Full Name) Rosann Auerbach, RN  Report received from (Full Name) Stephannie Peters, RN  Vital Signs  Temp (!) 97.5 F (36.4 C)  Temp Source Oral  Pulse Rate 96  Pulse Rate Source Monitor  Resp 20  BP (!) 123/106  BP Location Left Arm  BP Method Automatic  Patient Position (if appropriate) Lying  Oxygen Therapy  SpO2 91 %  O2 Device Room Air  Pain Assessment  Pain Scale 0-10  Pain Score 0  Dialysis Weight  Weight 126 kg  Type of Weight Post-Dialysis  Post-Hemodialysis Assessment  Rinseback Volume (mL) 250 mL  Dialyzer Clearance Lightly streaked  Duration of HD Treatment -hour(s) 2.5 hour(s)  Hemodialysis Intake (mL) 1125 mL  UF Total -Machine (mL) 636 mL  Net UF (mL) -489 mL  Tolerated HD Treatment No (Comment)  Post-Hemodialysis Comments Patient was able to complete treatment without fluid removal  AVG/AVF Arterial Site Held (minutes) 10 minutes  AVG/AVF Venous Site Held (minutes) 10 minutes  Fistula / Graft Right Forearm Arteriovenous fistula  Placement Date/Time: 06/14/17 1230   Orientation: Right  Access Location: Forearm  Access Type: Arteriovenous fistula  Site Condition No complications  Fistula / Graft Assessment Present;Thrill;Bruit  Drainage Description None

## 2018-09-12 NOTE — ED Notes (Signed)
Attempted report: Agricultural consultant working with bed placement to switch pt into a room closer to nurses station as pt is at inc risk for going into withdrawal.

## 2018-09-12 NOTE — ED Notes (Signed)
Archie Balboa notified in person of sodium 117.

## 2018-09-12 NOTE — ED Triage Notes (Signed)
Pt sent from Dialysis and is currently in withdrawal per dialysis and they refused to do tx on him.

## 2018-09-12 NOTE — ED Notes (Signed)
IV attempted x 1. 

## 2018-09-12 NOTE — Progress Notes (Signed)
Pre HD Treatment    09/12/18 1947  Vital Signs  Temp 98 F (36.7 C)  Temp Source Oral  Pulse Rate 80  Pulse Rate Source Monitor  Resp 14  BP (!) 158/108  BP Location Left Arm  BP Method Automatic  Patient Position (if appropriate) Lying  Oxygen Therapy  SpO2 100 %  O2 Device Room Air  Pain Assessment  Pain Scale 0-10  Pain Score 0  Dialysis Weight  Weight 125.1 kg  Type of Weight Pre-Dialysis  Time-Out for Hemodialysis  What Procedure? HD  Pt Identifiers(min of two) First/Last Name;MRN/Account#;Pt's DOB(use if MRN/Acct# not available  Correct Site? Yes  Correct Side? Yes  Correct Procedure? Yes  Consents Verified? Yes  Rad Studies Available? N/A  Safety Precautions Reviewed? Yes  Engineer, civil (consulting) Number 5  Station Number 1  UF/Alarm Test Passed  Conductivity: Meter 14  Conductivity: Machine  13.8  pH 7.4  Reverse Osmosis Main  Normal Saline Lot Number [id number]  Dialyzer Lot Number [id number]  Disposable Set Lot Number [id number]  Air Detector Armed and Audible Yes  Blood Lines Intact and Secured Yes  Pre Treatment Patient Checks  Vascular access used during treatment Fistula  Hepatitis B Surface Antigen Results Negative  Date Hepatitis B Surface Antigen Drawn 08/21/18  Hepatitis B Surface Antibody  (<10)  Date Hepatitis B Surface Antibody Drawn 07/22/18  Hemodialysis Consent Verified Yes  Hemodialysis Standing Orders Initiated Yes  ECG (Telemetry) Monitor On Yes  Prime Ordered Normal Saline  Length of  DialysisTreatment -hour(s) 2.5 Hour(s)  Dialysis Treatment Comments Na 140  Dialyzer Elisio 17H NR  Dialysate 2K, 2.5 Ca  Variable Sodium Other (Comment)  Dialysis Anticoagulant None  Dialysate Flow Ordered 500  Blood Flow Rate Ordered 250 mL/min  Ultrafiltration Goal 1 Liters  Pre Treatment Labs Phosphorus;Other (Comment)  Dialysis Blood Pressure Support Ordered Normal Saline  Education / Care Plan  Dialysis Education Provided Yes  Documented  Education in Care Plan Yes  Fistula / Graft Right Forearm Arteriovenous fistula  Placement Date/Time: 06/14/17 1230   Orientation: Right  Access Location: Forearm  Access Type: Arteriovenous fistula  Site Condition No complications  Fistula / Graft Assessment Present;Bruit  Drainage Description None

## 2018-09-12 NOTE — ED Notes (Signed)
Lab at bedside to obtain repeat BMP.

## 2018-09-12 NOTE — ED Notes (Signed)
Graft in R forearm can be heard but not felt.

## 2018-09-13 LAB — GLUCOSE, CAPILLARY
GLUCOSE-CAPILLARY: 328 mg/dL — AB (ref 70–99)
Glucose-Capillary: 251 mg/dL — ABNORMAL HIGH (ref 70–99)
Glucose-Capillary: 281 mg/dL — ABNORMAL HIGH (ref 70–99)
Glucose-Capillary: 427 mg/dL — ABNORMAL HIGH (ref 70–99)
Glucose-Capillary: 475 mg/dL — ABNORMAL HIGH (ref 70–99)

## 2018-09-13 LAB — SODIUM
Sodium: 125 mmol/L — ABNORMAL LOW (ref 135–145)
Sodium: 127 mmol/L — ABNORMAL LOW (ref 135–145)

## 2018-09-13 LAB — CBC
HCT: 29.9 % — ABNORMAL LOW (ref 39.0–52.0)
HEMOGLOBIN: 10.3 g/dL — AB (ref 13.0–17.0)
MCH: 30.7 pg (ref 26.0–34.0)
MCHC: 34.4 g/dL (ref 30.0–36.0)
MCV: 89.3 fL (ref 80.0–100.0)
Platelets: 128 10*3/uL — ABNORMAL LOW (ref 150–400)
RBC: 3.35 MIL/uL — AB (ref 4.22–5.81)
RDW: 12.3 % (ref 11.5–15.5)
WBC: 3.8 10*3/uL — ABNORMAL LOW (ref 4.0–10.5)
nRBC: 0 % (ref 0.0–0.2)

## 2018-09-13 LAB — BASIC METABOLIC PANEL
ANION GAP: 15 (ref 5–15)
BUN: 57 mg/dL — ABNORMAL HIGH (ref 6–20)
CO2: 21 mmol/L — AB (ref 22–32)
Calcium: 8.4 mg/dL — ABNORMAL LOW (ref 8.9–10.3)
Chloride: 89 mmol/L — ABNORMAL LOW (ref 98–111)
Creatinine, Ser: 7.25 mg/dL — ABNORMAL HIGH (ref 0.61–1.24)
GFR calc Af Amer: 9 mL/min — ABNORMAL LOW (ref >60)
GFR calc non Af Amer: 8 mL/min — ABNORMAL LOW (ref >60)
Glucose, Bld: 262 mg/dL — ABNORMAL HIGH (ref 70–99)
Potassium: 4 mmol/L (ref 3.5–5.1)
Sodium: 125 mmol/L — ABNORMAL LOW (ref 135–145)

## 2018-09-13 LAB — PHOSPHORUS: PHOSPHORUS: 2.8 mg/dL (ref 2.5–4.6)

## 2018-09-13 LAB — MRSA PCR SCREENING: MRSA by PCR: NEGATIVE

## 2018-09-13 MED ORDER — INSULIN ASPART 100 UNIT/ML ~~LOC~~ SOLN
12.0000 [IU] | Freq: Once | SUBCUTANEOUS | Status: AC
  Administered 2018-09-13: 12 [IU] via SUBCUTANEOUS
  Filled 2018-09-13: qty 1

## 2018-09-13 MED ORDER — SODIUM CHLORIDE 0.9 % IV SOLN: 100.0000 mL | INTRAVENOUS | Status: DC | PRN

## 2018-09-13 MED ORDER — INSULIN ASPART 100 UNIT/ML ~~LOC~~ SOLN
0.0000 [IU] | Freq: Three times a day (TID) | SUBCUTANEOUS | Status: DC
  Administered 2018-09-13: 7 [IU] via SUBCUTANEOUS
  Administered 2018-09-13: 5 [IU] via SUBCUTANEOUS
  Administered 2018-09-14: 3 [IU] via SUBCUTANEOUS
  Filled 2018-09-13 (×2): qty 1

## 2018-09-13 MED ORDER — LIDOCAINE-PRILOCAINE 2.5-2.5 % EX CREA
1.0000 "application " | TOPICAL_CREAM | CUTANEOUS | Status: DC | PRN
  Filled 2018-09-13: qty 5

## 2018-09-13 MED ORDER — LIDOCAINE HCL (PF) 1 % IJ SOLN
5.0000 mL | INTRAMUSCULAR | Status: DC | PRN
  Filled 2018-09-13: qty 5

## 2018-09-13 MED ORDER — ALTEPLASE 2 MG IJ SOLR: 2.0000 mg | Freq: Once | INTRAMUSCULAR | Status: DC | PRN

## 2018-09-13 MED ORDER — HEPARIN SODIUM (PORCINE) 1000 UNIT/ML DIALYSIS
1000.0000 [IU] | INTRAMUSCULAR | Status: DC | PRN
  Filled 2018-09-13: qty 1

## 2018-09-13 NOTE — Progress Notes (Signed)
Pt FSBS 427. Primary nurse paged and spoke to Dr. Duane Boston. Orders received for 12 units Novolog. Primary nurse to continue to monitor.

## 2018-09-13 NOTE — Progress Notes (Signed)
This note also relates to the following rows which could not be included: Pulse Rate - Cannot attach notes to unvalidated device data Resp - Cannot attach notes to unvalidated device data BP - Cannot attach notes to unvalidated device data  Hd started  

## 2018-09-13 NOTE — Progress Notes (Signed)
[facility]  ROUNDING NOTE   Subjective:  Patient well-known to [place] from prior admissions. He presented to dialysis yesterday but was felt to be inebriated. He was drinking approximately 50 to 60 ounces of beer per day. He was found to be quite hyponatremic upon admission. Patient did undergo dialysis yesterday with improvement in metabolic parameters.   Objective:  Vital signs in last 24 hours:  Temp:  [97.5 F (36.4 C)-98.8 F (37.1 C)] 98.8 F (37.1 C) (12/07 1203) Pulse Rate:  [60-106] 106 (12/07 1203) Resp:  [13-22] 19 (12/07 1203) BP: (73-158)/(45-108) 98/74 (12/07 1203) SpO2:  [78 %-100 %] 90 % (12/07 1203) Weight:  [125.1 kg-126 kg] 126 kg (12/06 2305)  Weight change:  Filed Weights   09/12/18 1110 09/12/18 1947 09/12/18 2305  Weight: 125.5 kg 125.1 kg 126 kg    Intake/Output: I/O last 3 completed shifts: In: 401.7 [P.O.:360; I.V.:41.7] Out: -289 [Urine:200]   Intake/Output this shift:  Total I/O In: 120 [P.O.:120] Out: -   Physical Exam: General: No acute distress  Head: Normocephalic, atraumatic. Moist oral mucosal membranes  Eyes: Anicteric  Neck: Supple, trachea midline  Lungs:  Clear to auscultation, normal effort  Heart: S1S2 no rubs  Abdomen:  Soft, nontender, bowel sounds present  Extremities: Trace peripheral edema.  Neurologic: Awake, alert, following commands  Skin: No lesions  Access: R AVF    Basic Metabolic Panel: Recent Labs  Lab 09/12/18 1126 09/12/18 1250 09/12/18 2147 09/13/18 0606 09/13/18 1059  NA 117* 117*  --  125* 125*  K 6.0* 6.1*  --  4.0  --   CL 83* 81*  --  89*  --   CO2 15* 16*  --  21*  --   GLUCOSE 299* 300*  --  262*  --   BUN 73* 71*  --  57*  --   CREATININE 8.02* 8.14*  --  7.25*  --   CALCIUM 9.1 9.1  --  8.4*  --   PHOS  --   --  2.2*  --   --     Liver Function Tests: No results for input(s): AST, ALT, ALKPHOS, BILITOT, PROT, ALBUMIN in the last 168 hours. No results for input(s):  LIPASE, AMYLASE in the last 168 hours. No results for input(s): AMMONIA in the last 168 hours.  CBC: Recent Labs  Lab 09/12/18 1250 09/13/18 0606  WBC 7.5 3.8*  NEUTROABS 6.4  --   HGB 12.8* 10.3*  HCT 36.8* 29.9*  MCV 88.5 89.3  PLT 198 128*    Cardiac Enzymes: No results for input(s): CKTOTAL, CKMB, CKMBINDEX, TROPONINI in the last 168 hours.  BNP: Invalid input(s): POCBNP  CBG: Recent Labs  Lab 09/12/18 1730 09/13/18 0002 09/13/18 1204  GLUCAP 297* 251* 59*    Microbiology: Results for orders placed or performed during the hospital encounter of 09/12/18  MRSA PCR Screening     Status: None   Collection Time: 09/13/18 12:05 AM  Result Value Ref Range Status   MRSA by PCR NEGATIVE NEGATIVE Final    Comment:        The GeneXpert MRSA Assay (FDA approved for NASAL specimens only), is one component of a comprehensive MRSA colonization surveillance program. It is not intended to diagnose MRSA infection nor to guide or monitor treatment for MRSA infections. Performed at [facility], [address] [place], [place] [address]     Coagulation Studies: No results for input(s): LABPROT, INR in the last 72 hours.  Urinalysis: No results for input(s): COLORURINE, LABSPEC, PHURINE, GLUCOSEU, HGBUR, BILIRUBINUR, KETONESUR, PROTEINUR, UROBILINOGEN, NITRITE, LEUKOCYTESUR in the last 72 hours.  Invalid input(s): APPERANCEUR    Imaging: No results found.   Medications:   . sodium chloride 40 mL/hr at 09/13/18 0832  . sodium chloride    . sodium chloride     . amitriptyline  25 mg Oral QHS  . aspirin EC  81 mg Oral Daily  . calcium acetate  1,334 mg Oral TID WC  . Chlorhexidine Gluconate Cloth  6 each Topical Q0600  . feeding supplement (NEPRO CARB STEADY)  237 mL Oral BID BM  . folic acid  1 mg Oral Daily  . furosemide  80 mg Oral BID  . gabapentin  300 mg Oral Daily  . glipiZIDE  5 mg Oral QAC breakfast  . heparin  5,000 Units Subcutaneous  Q8H  . insulin aspart  0-9 Units Subcutaneous TID WC  . insulin aspart  6 Units Subcutaneous TID WC  . insulin detemir  25 Units Subcutaneous BID  . metoprolol tartrate  25 mg Oral BID  . mometasone-formoterol  2 puff Inhalation BID  . multivitamin with minerals  1 tablet Oral Daily  . pantoprazole  40 mg Oral Daily  . thiamine  100 mg Oral Daily   Or  . thiamine  100 mg Intravenous Daily   sodium chloride, sodium chloride, acetaminophen **OR** acetaminophen, albuterol, alteplase, heparin, lidocaine (PF), lidocaine-prilocaine, LORazepam **OR** LORazepam, ondansetron **OR** ondansetron (ZOFRAN) IV  Assessment/ Plan:  57 y.o. male with hypertension, diabetes mellitus type 2 insulin dependent, hyperlipidemia, GERD, and morbid obesity  [facility]/125kg.  1.  ESRD on HD TTS.  Patient did undergo short dialysis treatment yesterday.  We will plan for an additional dialysis treatment today.  This treatment will be short for 2 hours today.  2.  Hyponatremia.  Serum sodium currently 125.  Suspect that most of his hyponatremia was acute in nature given the fact that he had significant beer ingestion.  3.  Anemia of chronic kidney disease.  Hemoglobin currently 10.3.  Follow-up hemoglobin as an outpatient.  Hold off on Epogen at this time.  4.  Secondary hyperparathyroidism.  Phosphorus is actually a bit low yesterday 2.2.  Continue to monitor.  LOS: 1 Kamel Haven 12/7/20192:01 PM

## 2018-09-13 NOTE — Progress Notes (Signed)
This note also relates to the following rows which could not be included: Pulse Rate - Cannot attach notes to unvalidated device data Resp - Cannot attach notes to unvalidated device data BP - Cannot attach notes to unvalidated device data  Hd completed  

## 2018-09-13 NOTE — Progress Notes (Signed)
[facility] [place] NAME: Ayden Hardwick    MR#:  [id number]  DATE OF BIRTH:  07-20-1961  SUBJECTIVE:  patient a bit sleepy however answered all questions appropriately. He tells me he is going to quit drinking once he gets discharged from here. No obvious tremors noted.  REVIEW OF SYSTEMS:   Review of Systems  Constitutional: Negative for chills, fever and weight loss.  HENT: Negative for ear discharge, ear pain and nosebleeds.   Eyes: Negative for blurred vision, pain and discharge.  Respiratory: Negative for sputum production, shortness of breath, wheezing and stridor.   Cardiovascular: Negative for chest pain, palpitations, orthopnea and PND.  Gastrointestinal: Negative for abdominal pain, diarrhea, nausea and vomiting.  Genitourinary: Negative for frequency and urgency.  Musculoskeletal: Negative for back pain and joint pain.  Neurological: Positive for weakness. Negative for sensory change, speech change and focal weakness.  Psychiatric/Behavioral: Negative for depression and hallucinations. The patient is not nervous/anxious.    Tolerating Diet:yes Tolerating PT: walks by himself  DRUG ALLERGIES:   Allergies  Allergen Reactions  . Codeine Nausea And Vomiting    VITALS:  Blood pressure 110/68, pulse 86, temperature 98 F (36.7 C), temperature source Oral, resp. rate 18, weight 126 kg, SpO2 95 %.  PHYSICAL EXAMINATION:   Physical Exam  GENERAL:  57 y.o.-year-old patient lying in the bed with no acute distress. obese EYES: Pupils equal, round, reactive to light and accommodation. No scleral icterus. Extraocular muscles intact.  HEENT: Head atraumatic, normocephalic. Oropharynx and nasopharynx clear.  NECK:  Supple, no jugular venous distention. No thyroid enlargement, no tenderness.  LUNGS: Normal breath sounds bilaterally, no wheezing, rales, rhonchi. No use of accessory muscles of respiration.  CARDIOVASCULAR: S1,  S2 normal. No murmurs, rubs, or gallops.  ABDOMEN: Soft, nontender, nondistended. Bowel sounds present. No organomegaly or mass.  EXTREMITIES: No cyanosis, clubbing or edema b/l.    NEUROLOGIC: Cranial nerves II through XII are intact. No focal Motor or sensory deficits b/l.   PSYCHIATRIC:  patient is alert and oriented x 3. A bit sleepy SKIN: No obvious rash, lesion, or ulcer.   LABORATORY PANEL:  CBC Recent Labs  Lab 09/13/18 0606  WBC 3.8*  HGB 10.3*  HCT 29.9*  PLT 128*    Chemistries  Recent Labs  Lab 09/13/18 0606  NA 125*  K 4.0  CL 89*  CO2 21*  GLUCOSE 262*  BUN 57*  CREATININE 7.25*  CALCIUM 8.4*   Cardiac Enzymes No results for input(s): TROPONINI in the last 168 hours. RADIOLOGY:  No results found. ASSESSMENT AND PLAN:   57 year old male with past medical history of end-stage renal disease on hemodialysis, diabetes, diabetic neuropathy, hypertension, hyperlipidemia, obstructive sleep apnea, alcohol abuse who presents to the hospital due to weakness, dizziness and noted to be hyponatremic.  1.  Acute hyponatremia- secondary to dehydration/beer portal mania. - We will gently hydrate the patient with IV fluids, patient will also get hemodialysis to help correct his sodium to get her to some free water. -came in with sodium of 117---125 - Follow sodium closely.  Nephrology has been consulted.  2.  Hyperkalemia-secondary to patient's ESRD and patient missing dialysis yesterday.   -discussed with nephrology and patient to got dialysis yday.  No acute EKG changes. -K 4.0 today  3.  End-stage renal disease on hemodialysis-patient gets dialysis on Tuesday Thursday Saturday, he missed his dialysis yesterday Thursday due to issues with port access -Nephrology has been  consulted.   4.  Alcohol abuse- patient last drink was yesterday.  He is at high risk for going into alcohol withdrawal. - We will place on CIWA protocol. -Patient is agreeable to alcohol  abstinence  5.  Essential hypertension-continue metoprolol.  6.  Diabetes type 2 without complication- continue NovoLog with meals will also place on sliding scale insulin -Continue Levemir.  7.  Diabetic neuropathy-continue gabapentin.  8.  COPD-no acute exacerbation-continue Symbicort, albuterol inhaler as needed.  Case discussed with Care Management/Social Worker. Management plans discussed with the patient, family and they are in agreement.  CODE STATUS: full  DVT Prophylaxis: heparin  TOTAL TIME TAKING CARE OF THIS PATIENT: *30* minutes.  >50% time spent on counselling and coordination of care  POSSIBLE D/C IN 1-2 DAYS, DEPENDING ON CLINICAL CONDITION.  Note: This dictation was prepared with Dragon dictation along with smaller phrase technology. Any transcriptional errors that result from this process are unintentional.  Fritzi Mandes M.D on 09/13/2018 at 10:12 AM  Between 7am to 6pm - Pager - [phone]  After 6pm go to [email or web address] - password [email or web address] [facility]  Office  [phone]  CC: Primary care physician; [name], MDPatient ID: Cato Mulligan, male   DOB: [birth year], 57 y.o.   MRN: [id number]

## 2018-09-14 LAB — SODIUM
Sodium: 125 mmol/L — ABNORMAL LOW (ref 135–145)
Sodium: 127 mmol/L — ABNORMAL LOW (ref 135–145)

## 2018-09-14 LAB — GLUCOSE, CAPILLARY
GLUCOSE-CAPILLARY: 472 mg/dL — AB (ref 70–99)
Glucose-Capillary: 134 mg/dL — ABNORMAL HIGH (ref 70–99)
Glucose-Capillary: 220 mg/dL — ABNORMAL HIGH (ref 70–99)
Glucose-Capillary: 328 mg/dL — ABNORMAL HIGH (ref 70–99)
Glucose-Capillary: 454 mg/dL — ABNORMAL HIGH (ref 70–99)

## 2018-09-14 LAB — PARATHYROID HORMONE, INTACT (NO CA): PTH: 107 pg/mL — ABNORMAL HIGH (ref 15–65)

## 2018-09-14 MED ORDER — INSULIN ASPART 100 UNIT/ML ~~LOC~~ SOLN
0.0000 [IU] | Freq: Three times a day (TID) | SUBCUTANEOUS | Status: DC
  Administered 2018-09-14: 2 [IU] via SUBCUTANEOUS
  Filled 2018-09-14: qty 1

## 2018-09-14 MED ORDER — INSULIN ASPART 100 UNIT/ML ~~LOC~~ SOLN
10.0000 [IU] | Freq: Once | SUBCUTANEOUS | Status: AC
  Administered 2018-09-14: 10 [IU] via SUBCUTANEOUS
  Filled 2018-09-14: qty 1

## 2018-09-14 MED ORDER — INSULIN ASPART 100 UNIT/ML ~~LOC~~ SOLN
12.0000 [IU] | Freq: Three times a day (TID) | SUBCUTANEOUS | Status: DC
  Administered 2018-09-14 (×2): 12 [IU] via SUBCUTANEOUS
  Filled 2018-09-14 (×2): qty 1

## 2018-09-14 MED ORDER — INSULIN ASPART 100 UNIT/ML ~~LOC~~ SOLN: 12.0000 [IU] | Freq: Three times a day (TID) | SUBCUTANEOUS | 11 refills | Status: DC

## 2018-09-14 NOTE — Progress Notes (Signed)
Pt FSBS remained elevated after insulin given. Primary nurse paged and spoke to Dr. Duane Boston. Orders received for 10 units of insulin Novolog. Primary nurse to continue to monitor.

## 2018-09-14 NOTE — Progress Notes (Signed)
[facility]  ROUNDING NOTE   Subjective:  Patient seen at bedside. Blood glucose still a bit high. This is playing some role in his hyponatremia. Patient underwent dialysis yesterday as well.   Objective:  Vital signs in last 24 hours:  Temp:  [97.9 F (36.6 C)-98.9 F (37.2 C)] 98.4 F (36.9 C) (12/08 1155) Pulse Rate:  [84-116] 93 (12/08 1257) Resp:  [13-20] 19 (12/08 1155) BP: (82-123)/(43-78) 103/71 (12/08 1257) SpO2:  [94 %-100 %] 100 % (12/08 1155)  Weight change:  Filed Weights   09/12/18 1110 09/12/18 1947 09/12/18 2305  Weight: 125.5 kg 125.1 kg 126 kg    Intake/Output: I/O last 3 completed shifts: In: 1312.8 [P.O.:360; I.V.:952.8] Out: 301 [Urine:650; Other:11]   Intake/Output this shift:  No intake/output data recorded.  Physical Exam: General: No acute distress  Head: Normocephalic, atraumatic. Moist oral mucosal membranes  Eyes: Anicteric  Neck: Supple, trachea midline  Lungs:  Clear to auscultation, normal effort  Heart: S1S2 no rubs  Abdomen:  Soft, nontender, bowel sounds present  Extremities: Trace peripheral edema.  Neurologic: Awake, alert, following commands  Skin: No lesions  Access: R AVF    Basic Metabolic Panel: Recent Labs  Lab 09/12/18 1126 09/12/18 1250 09/12/18 2147 09/13/18 0606 09/13/18 1059 09/13/18 1448 09/13/18 1912 09/14/18 0218 09/14/18 0959  NA 117* 117*  --  125* 125*  --  127* 125* 127*  K 6.0* 6.1*  --  4.0  --   --   --   --   --   CL 83* 81*  --  89*  --   --   --   --   --   CO2 15* 16*  --  21*  --   --   --   --   --   GLUCOSE 299* 300*  --  262*  --   --   --   --   --   BUN 73* 71*  --  57*  --   --   --   --   --   CREATININE 8.02* 8.14*  --  7.25*  --   --   --   --   --   CALCIUM 9.1 9.1  --  8.4*  --   --   --   --   --   PHOS  --   --  2.2*  --   --  2.8  --   --   --     Liver Function Tests: No results for input(s): AST, ALT, ALKPHOS, BILITOT, PROT, ALBUMIN in the last 168  hours. No results for input(s): LIPASE, AMYLASE in the last 168 hours. No results for input(s): AMMONIA in the last 168 hours.  CBC: Recent Labs  Lab 09/12/18 1250 09/13/18 0606  WBC 7.5 3.8*  NEUTROABS 6.4  --   HGB 12.8* 10.3*  HCT 36.8* 29.9*  MCV 88.5 89.3  PLT 198 128*    Cardiac Enzymes: No results for input(s): CKTOTAL, CKMB, CKMBINDEX, TROPONINI in the last 168 hours.  BNP: Invalid input(s): POCBNP  CBG: Recent Labs  Lab 09/14/18 0006 09/14/18 0032 09/14/18 0302 09/14/18 0740 09/14/18 1152  GLUCAP 472* 601* 97* 220* 134*    Microbiology: Results for orders placed or performed during the hospital encounter of 09/12/18  MRSA PCR Screening     Status: None   Collection Time: 09/13/18 12:05 AM  Result Value Ref Range Status   MRSA by PCR NEGATIVE NEGATIVE Final  Comment:        The GeneXpert MRSA Assay (FDA approved for NASAL specimens only), is one component of a comprehensive MRSA colonization surveillance program. It is not intended to diagnose MRSA infection nor to guide or monitor treatment for MRSA infections. Performed at [facility], [address] [place], [place] [address]     Coagulation Studies: No results for input(s): LABPROT, INR in the last 72 hours.  Urinalysis: No results for input(s): COLORURINE, LABSPEC, PHURINE, GLUCOSEU, HGBUR, BILIRUBINUR, KETONESUR, PROTEINUR, UROBILINOGEN, NITRITE, LEUKOCYTESUR in the last 72 hours.  Invalid input(s): APPERANCEUR    Imaging: No results found.   Medications:    . amitriptyline  25 mg Oral QHS  . aspirin EC  81 mg Oral Daily  . calcium acetate  1,334 mg Oral TID WC  . Chlorhexidine Gluconate Cloth  6 each Topical Q0600  . feeding supplement (NEPRO CARB STEADY)  237 mL Oral BID BM  . folic acid  1 mg Oral Daily  . furosemide  80 mg Oral BID  . gabapentin  300 mg Oral Daily  . glipiZIDE  5 mg Oral QAC breakfast  . heparin  5,000 Units Subcutaneous Q8H  . insulin  aspart  0-15 Units Subcutaneous TID WC  . insulin aspart  12 Units Subcutaneous TID WC  . insulin detemir  25 Units Subcutaneous BID  . metoprolol tartrate  25 mg Oral BID  . mometasone-formoterol  2 puff Inhalation BID  . multivitamin with minerals  1 tablet Oral Daily  . pantoprazole  40 mg Oral Daily  . thiamine  100 mg Oral Daily   Or  . thiamine  100 mg Intravenous Daily   acetaminophen **OR** acetaminophen, albuterol, LORazepam **OR** [DISCONTINUED] LORazepam, ondansetron **OR** ondansetron (ZOFRAN) IV  Assessment/ Plan:  57 y.o. male with hypertension, diabetes mellitus type 2 insulin dependent, hyperlipidemia, GERD, and morbid obesity  [facility]/[facility]/125kg.  1.  ESRD on HD TTS.  She completed hemodialysis yesterday.  Next dialysis on Tuesday as an outpatient.  2.  Hyponatremia.  Serum sodium is a bit better at 127.  Hyperglycemia playing a role in hyponatremia.  Advised patient to limit water intake.  3.  Anemia of chronic kidney disease.  Continue Epogen as an outpatient.  4.  Secondary hyperparathyroidism.  Recheck PTH, phosphorus, and calcium as an outpatient.    LOS: 2 Jeanean Hollett 12/8/20192:00 PM

## 2018-09-14 NOTE — Plan of Care (Signed)

## 2018-09-14 NOTE — Discharge Instructions (Signed)
Resume your HD as before

## 2018-09-14 NOTE — Progress Notes (Signed)
[facility] at [place] NAME: Steven [name]    MR#:  [id number]  DATE OF BIRTH:  1960-10-11  SUBJECTIVE:  wide awake. Doing well. No complaints. Sugars were on the higher side last evening and nighttime.  REVIEW OF SYSTEMS:   Review of Systems  Constitutional: Negative for chills, fever and weight loss.  HENT: Negative for ear discharge, ear pain and nosebleeds.   Eyes: Negative for blurred vision, pain and discharge.  Respiratory: Negative for sputum production, shortness of breath, wheezing and stridor.   Cardiovascular: Negative for chest pain, palpitations, orthopnea and PND.  Gastrointestinal: Negative for abdominal pain, diarrhea, nausea and vomiting.  Genitourinary: Negative for frequency and urgency.  Musculoskeletal: Negative for back pain and joint pain.  Neurological: Positive for weakness. Negative for sensory change, speech change and focal weakness.  Psychiatric/Behavioral: Negative for depression and hallucinations. The patient is not nervous/anxious.    Tolerating Diet:yes Tolerating PT: walks by himself  DRUG ALLERGIES:   Allergies  Allergen Reactions  . Codeine Nausea And Vomiting    VITALS:  Blood pressure 123/77, pulse 84, temperature 98.7 F (37.1 C), temperature source Oral, resp. rate 17, weight 126 kg, SpO2 95 %.  PHYSICAL EXAMINATION:   Physical Exam  GENERAL:  57 y.o.-year-old patient lying in the bed with no acute distress. obese EYES: Pupils equal, round, reactive to light and accommodation. No scleral icterus. Extraocular muscles intact.  HEENT: Head atraumatic, normocephalic. Oropharynx and nasopharynx clear.  NECK:  Supple, no jugular venous distention. No thyroid enlargement, no tenderness.  LUNGS: Normal breath sounds bilaterally, no wheezing, rales, rhonchi. No use of accessory muscles of respiration.  CARDIOVASCULAR: S1, S2 normal. No murmurs, rubs, or gallops.  ABDOMEN: Soft, nontender,  nondistended. Bowel sounds present. No organomegaly or mass.  EXTREMITIES: No cyanosis, clubbing or edema b/l.    NEUROLOGIC: Cranial nerves II through XII are intact. No focal Motor or sensory deficits b/l.   PSYCHIATRIC:  patient is alert and oriented x 3. A bit sleepy SKIN: No obvious rash, lesion, or ulcer.   LABORATORY PANEL:  CBC Recent Labs  Lab 09/13/18 0606  WBC 3.8*  HGB 10.3*  HCT 29.9*  PLT 128*    Chemistries  Recent Labs  Lab 09/13/18 0606  09/14/18 0218  NA 125*   < > 125*  K 4.0  --   --   CL 89*  --   --   CO2 21*  --   --   GLUCOSE 262*  --   --   BUN 57*  --   --   CREATININE 7.25*  --   --   CALCIUM 8.4*  --   --    < > = values in this interval not displayed.   Cardiac Enzymes No results for input(s): TROPONINI in the last 168 hours. RADIOLOGY:  No results found. ASSESSMENT AND PLAN:   57 year old male with past medical history of end-stage renal disease on hemodialysis, diabetes, diabetic neuropathy, hypertension, hyperlipidemia, obstructive sleep apnea, alcohol abuse who presents to the hospital due to weakness, dizziness and noted to be hyponatremic.  1.  Acute hyponatremia- secondary to dehydration/beer potomania/ ?PseudoHyponatremia secondary to uncontrolled sugar also - We will gently hydrate the patient with IV fluids, patient will also get hemodialysis to help correct his sodium to get her to some free water. -came in with sodium of 117---125--127--125 - Follow sodium closely.  Nephrology has been consulted.   2.  Hyperkalemia-secondary to  patient's ESRD and patient missing dialysis yesterday.   -discussed with nephrology and patient to got dialysis yday.  No acute EKG changes. -K 4.0 today  3.  End-stage renal disease on hemodialysis-patient gets dialysis on Tuesday Thursday Saturday, he missed his dialysis yesterday Thursday due to issues with port access -Nephrology has been consulted.   4.  Alcohol abuse- patient last drink was  yesterday.  He is at high risk for going into alcohol withdrawal. - We will place on CIWA protocol. -Patient is agreeable to alcohol abstinence  5.  Essential hypertension-continue metoprolol.  6.  Diabetes type 2 without complication- continue NovoLog with meals will also place on sliding scale insulin -Continue Levemir, as part TID with meals and sliding scale insulin  7.  Diabetic neuropathy-continue gabapentin.  8.  COPD-no acute exacerbation-continue Symbicort, albuterol inhaler as needed.  Case discussed with Care Management/Social Worker. Management plans discussed with the patient, family and they are in agreement.  CODE STATUS: full  DVT Prophylaxis: heparin  TOTAL TIME TAKING CARE OF THIS PATIENT: *30* minutes.  >50% time spent on counselling and coordination of care  POSSIBLE D/C IN 1-2 DAYS, DEPENDING ON CLINICAL CONDITION.  Note: This dictation was prepared with Dragon dictation along with smaller phrase technology. Any transcriptional errors that result from this process are unintentional.  Fritzi Mandes M.D on 09/14/2018 at 9:45 AM  Between 7am to 6pm - Pager - [phone]  After 6pm go to [email or web address] - password [email or web address] [facility]  Office  [phone]  CC: Primary care physician; [name], MDPatient ID: Steven [name], male   DOB: 1960-12-23, 57 y.o.   MRN: [id number]

## 2018-09-14 NOTE — Discharge Summary (Signed)
[facility] at [place] NAME: Steven [name]    MR#:  [id number]  DATE OF BIRTH:  07/21/1961  DATE OF ADMISSION:  09/12/2018 ADMITTING PHYSICIAN: Henreitta Leber, MD  DATE OF DISCHARGE: 09/14/2018  PRIMARY CARE PHYSICIAN: [name], MD    ADMISSION DIAGNOSIS:  Hyponatremia [E87.1]  DISCHARGE DIAGNOSIS:  Acute Hyponatremia due to [facility] potomania and uncontrolled sugars ESRD on HD  SECONDARY DIAGNOSIS:   Past Medical History:  Diagnosis Date  . Anemia   . Cellulitis and abscess of right lower extremity 03/01/2017  . Chronic kidney disease    dialysis m,w,f  . Diabetes mellitus without complication ([place])   . Edema extremities 03/01/2017   bilateral swelling  . GERD (gastroesophageal reflux disease)   . High cholesterol   . High triglycerides   . Hypertension   . Neuropathy   . Sleep apnea    can't use cpap d/t feelings of suffocation    HOSPITAL COURSE:  57 year old male with past medical history of end-stage renal disease on hemodialysis, diabetes, diabetic neuropathy, hypertension, hyperlipidemia, obstructive sleep apnea, alcohol abuse who presents to the hospital due to weakness, dizziness and noted to be hyponatremic.  1. Acute hyponatremia- secondary to dehydration/beer potomania/ ?PseudoHyponatremia secondary to uncontrolled sugar also - We will gently hydrate the patient with IV fluids, patient will also get hemodialysis to help correct his sodium to get her to some free water. -came in with sodium of 117---125--127--125 - Follow sodium closely. Nephrology has been consulted--Ok with Dr Holley Raring for pt to go home. His sodium will be monitored as out pt with HD  2. Hyperkalemia-secondary to patient's ESRD and patient missing dialysis yesterday.  -discussed with nephrology and patient to got dialysis yday. No acute EKG changes. -K 4.0 today  3.End-stage renal disease on hemodialysis-patient gets dialysis  on Tuesday Thursday Saturday, he missed his dialysis yesterday Thursday due to issues with port access -Nephrology has been consulted.   4. Alcohol abuse- patient last drink was yesterday.  -He is at high risk for going into alcohol withdrawal. - We will place on CIWA protocol--scoring zero -Patient is agreeable to alcohol abstinence  5. Essential hypertension-continue metoprolol.  6. Diabetes type 2 without complication- continue NovoLog with meals will also place on sliding scale insulin -Continue Levemir, as part TID with meals and sliding scale insulin  7. Diabetic neuropathy-continue gabapentin.  8. COPD-no acute exacerbation-continue Symbicort, albuterol inhaler as needed.  D/c home CONSULTS OBTAINED:  Treatment Team:  Anthonette Legato, MD  DRUG ALLERGIES:   Allergies  Allergen Reactions  . Codeine Nausea And Vomiting    DISCHARGE MEDICATIONS:   Allergies as of 09/14/2018      Reactions   Codeine Nausea And Vomiting      Medication List    STOP taking these medications   GOODY HEADACHE PO     TAKE these medications   accu-chek multiclix lancets Use as instructed.  check blood sugar up to 3 times a day Disp:  1 box   amitriptyline 25 MG tablet Commonly known as:  ELAVIL Take 25 mg by mouth at bedtime.   aspirin EC 81 MG tablet TAKE ONE  BY MOUTH EVERY DAY   calcium acetate 667 MG capsule Commonly known as:  PHOSLO Take 2 capsules (1,334 mg total) by mouth 3 (three) times daily with meals.   feeding supplement (NEPRO CARB STEADY) Liqd Take 237 mLs by mouth 2 (two) times daily between meals.  furosemide 80 MG tablet Commonly known as:  LASIX Take 1 tablet (80 mg total) by mouth daily. What changed:  when to take this   gabapentin 300 MG capsule Commonly known as:  NEURONTIN Take 1 capsule (300 mg total) by mouth 2 (two) times daily. What changed:  when to take this   glipiZIDE 5 MG tablet Commonly known as:  GLUCOTROL 5 mg daily  before breakfast.   glucose blood test strip Use as instructed.use to check blood sugar up to 3 times a day  Disp: 1 box   insulin aspart 100 UNIT/ML injection Commonly known as:  novoLOG Inject 12 Units into the skin 3 (three) times daily with meals. What changed:    how much to take  when to take this  Another medication with the same name was removed. Continue taking this medication, and follow the directions you see here.   insulin detemir 100 UNIT/ML injection Commonly known as:  LEVEMIR Inject 0.25 mLs (25 Units total) into the skin 2 (two) times daily.   Insulin Pen Needle 31G X 8 MM Misc 1 Container by Does not apply route QID. qid--any brand   Insulin Syringes (Disposable) U-100 1 ML Misc Use as directed with insulin   metoprolol tartrate 25 MG tablet Commonly known as:  LOPRESSOR Take 1 tablet (25 mg total) by mouth 2 (two) times daily.   midodrine 5 MG tablet Commonly known as:  PROAMATINE Take 5 mg by mouth daily as needed.   multivitamin Tabs tablet Take 1 tablet by mouth at bedtime.   omeprazole 20 MG tablet Commonly known as:  PRILOSEC OTC Take 20 mg by mouth daily.   PROAIR HFA 108 (90 Base) MCG/ACT inhaler Generic drug:  albuterol Inhale 2 puffs into the lungs every 4 (four) hours as needed for wheezing or shortness of breath.   SYMBICORT 80-4.5 MCG/ACT inhaler Generic drug:  budesonide-formoterol Inhale 2 puffs into the lungs daily.       If you experience worsening of your admission symptoms, develop shortness of breath, life threatening emergency, suicidal or homicidal thoughts you must seek medical attention immediately by calling 911 or calling your MD immediately  if symptoms less severe.  You Must read complete instructions/literature along with all the possible adverse reactions/side effects for all the Medicines you take and that have been prescribed to you. Take any new Medicines after you have completely understood and accept all the  possible adverse reactions/side effects.   Please note  You were cared for by a hospitalist during your hospital stay. If you have any questions about your discharge medications or the care you received while you were in the hospital after you are discharged, you can call the unit and asked to speak with the hospitalist on call if the hospitalist that took care of you is not available. Once you are discharged, your primary care physician will handle any further medical issues. Please note that NO REFILLS for any discharge medications will be authorized once you are discharged, as it is imperative that you return to your primary care physician (or establish a relationship with a primary care physician if you do not have one) for your aftercare needs so that they can reassess your need for medications and monitor your lab values.   DATA REVIEW:   CBC  Recent Labs  Lab 09/13/18 0606  WBC 3.8*  HGB 10.3*  HCT 29.9*  PLT 128*    Chemistries  Recent Labs  Lab 09/13/18 0606  09/14/18 0218  NA 125*   < > 125*  K 4.0  --   --   CL 89*  --   --   CO2 21*  --   --   GLUCOSE 262*  --   --   BUN 57*  --   --   CREATININE 7.25*  --   --   CALCIUM 8.4*  --   --    < > = values in this interval not displayed.    Microbiology Results   Recent Results (from the past 240 hour(s))  MRSA PCR Screening     Status: None   Collection Time: 09/13/18 12:05 AM  Result Value Ref Range Status   MRSA by PCR NEGATIVE NEGATIVE Final    Comment:        The GeneXpert MRSA Assay (FDA approved for NASAL specimens only), is one component of a comprehensive MRSA colonization surveillance program. It is not intended to diagnose MRSA infection nor to guide or monitor treatment for MRSA infections. Performed at [facility], [address] [place], [place] [address]     RADIOLOGY:  No results found.   Management plans discussed with the patient, family and they are in agreement.  CODE  STATUS:     Code Status Orders  (From admission, onward)         Start     Ordered   09/12/18 1700  Full code  Continuous     09/12/18 1659        Code Status History    Date Active Date Inactive Code Status Order ID Comments User Context   06/19/2018 1609 06/24/2018 1620 Full Code [id number]  Bettey Costa, MD Inpatient   09/18/2017 1437 09/20/2017 2103 Full Code [id number]  Demetrios Loll, MD Inpatient   02/27/2017 2054 03/07/2017 2137 Full Code [id number]  Vaughan Basta, MD Inpatient      TOTAL TIME TAKING CARE OF THIS PATIENT: *40* minutes.    Fritzi Mandes M.D on 09/14/2018 at 11:54 AM  Between 7am to 6pm - Pager - [phone] After 6pm go to [email or web address] - password [email or web address] [place] [facility] [facility]  Office  [phone]  CC: Primary care physician; [name], MD

## 2018-09-16 ENCOUNTER — Telehealth (INDEPENDENT_AMBULATORY_CARE_PROVIDER_SITE_OTHER): Payer: Self-pay

## 2018-09-16 DIAGNOSIS — R110 Nausea: Secondary | ICD-10-CM | POA: Diagnosis not present

## 2018-09-16 DIAGNOSIS — Z992 Dependence on renal dialysis: Secondary | ICD-10-CM | POA: Diagnosis not present

## 2018-09-16 DIAGNOSIS — N2581 Secondary hyperparathyroidism of renal origin: Secondary | ICD-10-CM | POA: Diagnosis not present

## 2018-09-16 DIAGNOSIS — R1110 Vomiting, unspecified: Secondary | ICD-10-CM | POA: Diagnosis not present

## 2018-09-16 DIAGNOSIS — D509 Iron deficiency anemia, unspecified: Secondary | ICD-10-CM | POA: Diagnosis not present

## 2018-09-16 DIAGNOSIS — N186 End stage renal disease: Secondary | ICD-10-CM | POA: Diagnosis not present

## 2018-09-16 DIAGNOSIS — D631 Anemia in chronic kidney disease: Secondary | ICD-10-CM | POA: Diagnosis not present

## 2018-09-16 NOTE — Telephone Encounter (Signed)
Heather called and stated that the patient may have possible stenosis since he was not able to dialyze today.  Didn't leave any other information.

## 2018-09-18 DIAGNOSIS — Z992 Dependence on renal dialysis: Secondary | ICD-10-CM | POA: Diagnosis not present

## 2018-09-18 DIAGNOSIS — N186 End stage renal disease: Secondary | ICD-10-CM | POA: Diagnosis not present

## 2018-09-18 DIAGNOSIS — D509 Iron deficiency anemia, unspecified: Secondary | ICD-10-CM | POA: Diagnosis not present

## 2018-09-18 DIAGNOSIS — N2581 Secondary hyperparathyroidism of renal origin: Secondary | ICD-10-CM | POA: Diagnosis not present

## 2018-09-18 DIAGNOSIS — R1110 Vomiting, unspecified: Secondary | ICD-10-CM | POA: Diagnosis not present

## 2018-09-18 DIAGNOSIS — R110 Nausea: Secondary | ICD-10-CM | POA: Diagnosis not present

## 2018-09-20 DIAGNOSIS — Z992 Dependence on renal dialysis: Secondary | ICD-10-CM | POA: Diagnosis not present

## 2018-09-20 DIAGNOSIS — R1110 Vomiting, unspecified: Secondary | ICD-10-CM | POA: Diagnosis not present

## 2018-09-20 DIAGNOSIS — N2581 Secondary hyperparathyroidism of renal origin: Secondary | ICD-10-CM | POA: Diagnosis not present

## 2018-09-20 DIAGNOSIS — N186 End stage renal disease: Secondary | ICD-10-CM | POA: Diagnosis not present

## 2018-09-20 DIAGNOSIS — R110 Nausea: Secondary | ICD-10-CM | POA: Diagnosis not present

## 2018-09-20 DIAGNOSIS — D509 Iron deficiency anemia, unspecified: Secondary | ICD-10-CM | POA: Diagnosis not present

## 2018-09-22 ENCOUNTER — Telehealth: Payer: Self-pay

## 2018-09-22 NOTE — Telephone Encounter (Signed)
[facility] Follow-up: Received a call from Steven [name] and he said he has always received great care here but would like to be removed from future [facility]MI follow-up calls as he finds these automated calls irritating and he gets so many of them.  I placed a ticket with [facility]MI to unenroll the patient as requested.

## 2018-09-25 DIAGNOSIS — R110 Nausea: Secondary | ICD-10-CM | POA: Diagnosis not present

## 2018-09-25 DIAGNOSIS — N186 End stage renal disease: Secondary | ICD-10-CM | POA: Diagnosis not present

## 2018-09-25 DIAGNOSIS — R1110 Vomiting, unspecified: Secondary | ICD-10-CM | POA: Diagnosis not present

## 2018-09-25 DIAGNOSIS — N2581 Secondary hyperparathyroidism of renal origin: Secondary | ICD-10-CM | POA: Diagnosis not present

## 2018-09-25 DIAGNOSIS — Z992 Dependence on renal dialysis: Secondary | ICD-10-CM | POA: Diagnosis not present

## 2018-09-25 DIAGNOSIS — D509 Iron deficiency anemia, unspecified: Secondary | ICD-10-CM | POA: Diagnosis not present

## 2018-09-27 DIAGNOSIS — D509 Iron deficiency anemia, unspecified: Secondary | ICD-10-CM | POA: Diagnosis not present

## 2018-09-27 DIAGNOSIS — R1110 Vomiting, unspecified: Secondary | ICD-10-CM | POA: Diagnosis not present

## 2018-09-27 DIAGNOSIS — N186 End stage renal disease: Secondary | ICD-10-CM | POA: Diagnosis not present

## 2018-09-27 DIAGNOSIS — Z992 Dependence on renal dialysis: Secondary | ICD-10-CM | POA: Diagnosis not present

## 2018-09-27 DIAGNOSIS — N2581 Secondary hyperparathyroidism of renal origin: Secondary | ICD-10-CM | POA: Diagnosis not present

## 2018-09-27 DIAGNOSIS — R110 Nausea: Secondary | ICD-10-CM | POA: Diagnosis not present

## 2018-09-29 DIAGNOSIS — D509 Iron deficiency anemia, unspecified: Secondary | ICD-10-CM | POA: Diagnosis not present

## 2018-09-29 DIAGNOSIS — R110 Nausea: Secondary | ICD-10-CM | POA: Diagnosis not present

## 2018-09-29 DIAGNOSIS — N2581 Secondary hyperparathyroidism of renal origin: Secondary | ICD-10-CM | POA: Diagnosis not present

## 2018-09-29 DIAGNOSIS — N186 End stage renal disease: Secondary | ICD-10-CM | POA: Diagnosis not present

## 2018-09-29 DIAGNOSIS — Z992 Dependence on renal dialysis: Secondary | ICD-10-CM | POA: Diagnosis not present

## 2018-09-29 DIAGNOSIS — R1110 Vomiting, unspecified: Secondary | ICD-10-CM | POA: Diagnosis not present

## 2018-10-02 DIAGNOSIS — R110 Nausea: Secondary | ICD-10-CM | POA: Diagnosis not present

## 2018-10-02 DIAGNOSIS — Z992 Dependence on renal dialysis: Secondary | ICD-10-CM | POA: Diagnosis not present

## 2018-10-02 DIAGNOSIS — D509 Iron deficiency anemia, unspecified: Secondary | ICD-10-CM | POA: Diagnosis not present

## 2018-10-02 DIAGNOSIS — N2581 Secondary hyperparathyroidism of renal origin: Secondary | ICD-10-CM | POA: Diagnosis not present

## 2018-10-02 DIAGNOSIS — N186 End stage renal disease: Secondary | ICD-10-CM | POA: Diagnosis not present

## 2018-10-02 DIAGNOSIS — R1110 Vomiting, unspecified: Secondary | ICD-10-CM | POA: Diagnosis not present

## 2018-10-04 DIAGNOSIS — Z992 Dependence on renal dialysis: Secondary | ICD-10-CM | POA: Diagnosis not present

## 2018-10-04 DIAGNOSIS — R1110 Vomiting, unspecified: Secondary | ICD-10-CM | POA: Diagnosis not present

## 2018-10-04 DIAGNOSIS — R110 Nausea: Secondary | ICD-10-CM | POA: Diagnosis not present

## 2018-10-04 DIAGNOSIS — N2581 Secondary hyperparathyroidism of renal origin: Secondary | ICD-10-CM | POA: Diagnosis not present

## 2018-10-04 DIAGNOSIS — D509 Iron deficiency anemia, unspecified: Secondary | ICD-10-CM | POA: Diagnosis not present

## 2018-10-04 DIAGNOSIS — N186 End stage renal disease: Secondary | ICD-10-CM | POA: Diagnosis not present

## 2018-10-07 DIAGNOSIS — R110 Nausea: Secondary | ICD-10-CM | POA: Diagnosis not present

## 2018-10-07 DIAGNOSIS — R1110 Vomiting, unspecified: Secondary | ICD-10-CM | POA: Diagnosis not present

## 2018-10-07 DIAGNOSIS — Z992 Dependence on renal dialysis: Secondary | ICD-10-CM | POA: Diagnosis not present

## 2018-10-07 DIAGNOSIS — N186 End stage renal disease: Secondary | ICD-10-CM | POA: Diagnosis not present

## 2018-10-07 DIAGNOSIS — D509 Iron deficiency anemia, unspecified: Secondary | ICD-10-CM | POA: Diagnosis not present

## 2018-10-07 DIAGNOSIS — N2581 Secondary hyperparathyroidism of renal origin: Secondary | ICD-10-CM | POA: Diagnosis not present

## 2018-10-09 DIAGNOSIS — N2581 Secondary hyperparathyroidism of renal origin: Secondary | ICD-10-CM | POA: Diagnosis not present

## 2018-10-09 DIAGNOSIS — N186 End stage renal disease: Secondary | ICD-10-CM | POA: Diagnosis not present

## 2018-10-09 DIAGNOSIS — D509 Iron deficiency anemia, unspecified: Secondary | ICD-10-CM | POA: Diagnosis not present

## 2018-10-09 DIAGNOSIS — D631 Anemia in chronic kidney disease: Secondary | ICD-10-CM | POA: Diagnosis not present

## 2018-10-09 DIAGNOSIS — Z992 Dependence on renal dialysis: Secondary | ICD-10-CM | POA: Diagnosis not present

## 2018-10-15 DIAGNOSIS — T82868A Thrombosis of vascular prosthetic devices, implants and grafts, initial encounter: Secondary | ICD-10-CM | POA: Diagnosis not present

## 2018-10-15 DIAGNOSIS — Z992 Dependence on renal dialysis: Secondary | ICD-10-CM | POA: Diagnosis not present

## 2018-10-15 DIAGNOSIS — N186 End stage renal disease: Secondary | ICD-10-CM | POA: Diagnosis not present

## 2018-10-17 ENCOUNTER — Emergency Department (HOSPITAL_COMMUNITY): Payer: Medicare Other

## 2018-10-17 ENCOUNTER — Encounter (HOSPITAL_COMMUNITY): Payer: Self-pay

## 2018-10-17 ENCOUNTER — Inpatient Hospital Stay (HOSPITAL_COMMUNITY)
Admission: EM | Admit: 2018-10-17 | Discharge: 2018-10-19 | DRG: 391 | Disposition: A | Discharge status: Home / Self Care | Payer: Medicare Other | Attending: Internal Medicine | Admitting: Internal Medicine

## 2018-10-17 ENCOUNTER — Other Ambulatory Visit: Payer: Self-pay

## 2018-10-17 DIAGNOSIS — Z885 Allergy status to narcotic agent status: Secondary | ICD-10-CM | POA: Diagnosis not present

## 2018-10-17 DIAGNOSIS — Z992 Dependence on renal dialysis: Secondary | ICD-10-CM

## 2018-10-17 DIAGNOSIS — E781 Pure hyperglyceridemia: Secondary | ICD-10-CM | POA: Diagnosis present

## 2018-10-17 DIAGNOSIS — R740 Nonspecific elevation of levels of transaminase and lactic acid dehydrogenase [LDH]: Secondary | ICD-10-CM | POA: Diagnosis not present

## 2018-10-17 DIAGNOSIS — F17290 Nicotine dependence, other tobacco product, uncomplicated: Secondary | ICD-10-CM | POA: Diagnosis present

## 2018-10-17 DIAGNOSIS — N186 End stage renal disease: Secondary | ICD-10-CM

## 2018-10-17 DIAGNOSIS — E1169 Type 2 diabetes mellitus with other specified complication: Secondary | ICD-10-CM | POA: Diagnosis present

## 2018-10-17 DIAGNOSIS — E669 Obesity, unspecified: Secondary | ICD-10-CM | POA: Diagnosis not present

## 2018-10-17 DIAGNOSIS — E785 Hyperlipidemia, unspecified: Secondary | ICD-10-CM | POA: Diagnosis not present

## 2018-10-17 DIAGNOSIS — Z794 Long term (current) use of insulin: Secondary | ICD-10-CM | POA: Diagnosis not present

## 2018-10-17 DIAGNOSIS — Z6838 Body mass index (BMI) 38.0-38.9, adult: Secondary | ICD-10-CM | POA: Diagnosis not present

## 2018-10-17 DIAGNOSIS — K760 Fatty (change of) liver, not elsewhere classified: Secondary | ICD-10-CM | POA: Diagnosis not present

## 2018-10-17 DIAGNOSIS — R7989 Other specified abnormal findings of blood chemistry: Secondary | ICD-10-CM

## 2018-10-17 DIAGNOSIS — E872 Acidosis: Secondary | ICD-10-CM | POA: Diagnosis present

## 2018-10-17 DIAGNOSIS — D631 Anemia in chronic kidney disease: Secondary | ICD-10-CM | POA: Diagnosis present

## 2018-10-17 DIAGNOSIS — R55 Syncope and collapse: Secondary | ICD-10-CM | POA: Diagnosis not present

## 2018-10-17 DIAGNOSIS — E7800 Pure hypercholesterolemia, unspecified: Secondary | ICD-10-CM | POA: Diagnosis present

## 2018-10-17 DIAGNOSIS — I959 Hypotension, unspecified: Secondary | ICD-10-CM | POA: Diagnosis not present

## 2018-10-17 DIAGNOSIS — E1122 Type 2 diabetes mellitus with diabetic chronic kidney disease: Secondary | ICD-10-CM | POA: Diagnosis not present

## 2018-10-17 DIAGNOSIS — Z823 Family history of stroke: Secondary | ICD-10-CM | POA: Diagnosis not present

## 2018-10-17 DIAGNOSIS — R197 Diarrhea, unspecified: Secondary | ICD-10-CM | POA: Diagnosis not present

## 2018-10-17 DIAGNOSIS — G4730 Sleep apnea, unspecified: Secondary | ICD-10-CM | POA: Diagnosis present

## 2018-10-17 DIAGNOSIS — J449 Chronic obstructive pulmonary disease, unspecified: Secondary | ICD-10-CM | POA: Diagnosis present

## 2018-10-17 DIAGNOSIS — R0902 Hypoxemia: Secondary | ICD-10-CM | POA: Diagnosis not present

## 2018-10-17 DIAGNOSIS — K219 Gastro-esophageal reflux disease without esophagitis: Secondary | ICD-10-CM | POA: Diagnosis not present

## 2018-10-17 DIAGNOSIS — E871 Hypo-osmolality and hyponatremia: Secondary | ICD-10-CM

## 2018-10-17 DIAGNOSIS — E860 Dehydration: Secondary | ICD-10-CM | POA: Diagnosis present

## 2018-10-17 DIAGNOSIS — J90 Pleural effusion, not elsewhere classified: Secondary | ICD-10-CM | POA: Diagnosis not present

## 2018-10-17 DIAGNOSIS — R609 Edema, unspecified: Secondary | ICD-10-CM | POA: Diagnosis not present

## 2018-10-17 DIAGNOSIS — I120 Hypertensive chronic kidney disease with stage 5 chronic kidney disease or end stage renal disease: Secondary | ICD-10-CM | POA: Diagnosis present

## 2018-10-17 DIAGNOSIS — I953 Hypotension of hemodialysis: Secondary | ICD-10-CM | POA: Diagnosis present

## 2018-10-17 DIAGNOSIS — K529 Noninfective gastroenteritis and colitis, unspecified: Principal | ICD-10-CM

## 2018-10-17 DIAGNOSIS — Z7951 Long term (current) use of inhaled steroids: Secondary | ICD-10-CM

## 2018-10-17 DIAGNOSIS — Z8249 Family history of ischemic heart disease and other diseases of the circulatory system: Secondary | ICD-10-CM

## 2018-10-17 DIAGNOSIS — N2581 Secondary hyperparathyroidism of renal origin: Secondary | ICD-10-CM | POA: Diagnosis not present

## 2018-10-17 DIAGNOSIS — R531 Weakness: Secondary | ICD-10-CM | POA: Diagnosis not present

## 2018-10-17 LAB — CBC
HCT: 34.5 % — ABNORMAL LOW (ref 39.0–52.0)
HEMOGLOBIN: 11.1 g/dL — AB (ref 13.0–17.0)
MCH: 30.3 pg (ref 26.0–34.0)
MCHC: 32.2 g/dL (ref 30.0–36.0)
MCV: 94.3 fL (ref 80.0–100.0)
Platelets: 152 10*3/uL (ref 150–400)
RBC: 3.66 MIL/uL — ABNORMAL LOW (ref 4.22–5.81)
RDW: 15.5 % (ref 11.5–15.5)
WBC: 8.5 10*3/uL (ref 4.0–10.5)
nRBC: 0 % (ref 0.0–0.2)

## 2018-10-17 LAB — COMPREHENSIVE METABOLIC PANEL
ALT: 54 U/L — ABNORMAL HIGH (ref 0–44)
AST: 58 U/L — ABNORMAL HIGH (ref 15–41)
Albumin: 2.5 g/dL — ABNORMAL LOW (ref 3.5–5.0)
Alkaline Phosphatase: 291 U/L — ABNORMAL HIGH (ref 38–126)
BUN: 86 mg/dL — ABNORMAL HIGH (ref 6–20)
CO2: 14 mmol/L — ABNORMAL LOW (ref 22–32)
Calcium: 7.8 mg/dL — ABNORMAL LOW (ref 8.9–10.3)
Chloride: 87 mmol/L — ABNORMAL LOW (ref 98–111)
Creatinine, Ser: 12.97 mg/dL — ABNORMAL HIGH (ref 0.61–1.24)
GFR calc Af Amer: 4 mL/min — ABNORMAL LOW (ref >60)
GFR, EST NON AFRICAN AMERICAN: 4 mL/min — AB (ref >60)
Glucose, Bld: 171 mg/dL — ABNORMAL HIGH (ref 70–99)
Potassium: 5.4 mmol/L — ABNORMAL HIGH (ref 3.5–5.1)
Sodium: 122 mmol/L — ABNORMAL LOW (ref 135–145)
Total Bilirubin: 2 mg/dL — ABNORMAL HIGH (ref 0.3–1.2)
Total Protein: 6.4 g/dL — ABNORMAL LOW (ref 6.5–8.1)

## 2018-10-17 LAB — URINALYSIS, ROUTINE W REFLEX MICROSCOPIC
Bacteria, UA: NONE SEEN
Bilirubin Urine: NEGATIVE
Glucose, UA: 50 mg/dL — AB
KETONES UR: NEGATIVE mg/dL
Leukocytes, UA: NEGATIVE
Nitrite: NEGATIVE
Protein, ur: NEGATIVE mg/dL
Specific Gravity, Urine: 1.008 (ref 1.005–1.030)
pH: 5 (ref 5.0–8.0)

## 2018-10-17 LAB — HEMOGLOBIN A1C
Hgb A1c MFr Bld: 8.6 % — ABNORMAL HIGH (ref 4.8–5.6)
Mean Plasma Glucose: 200.12 mg/dL

## 2018-10-17 LAB — CBG MONITORING, ED: Glucose-Capillary: 157 mg/dL — ABNORMAL HIGH (ref 70–99)

## 2018-10-17 LAB — GLUCOSE, CAPILLARY
Glucose-Capillary: 153 mg/dL — ABNORMAL HIGH (ref 70–99)
Glucose-Capillary: 182 mg/dL — ABNORMAL HIGH (ref 70–99)

## 2018-10-17 LAB — I-STAT CG4 LACTIC ACID, ED: Lactic Acid, Venous: 4.42 mmol/L (ref 0.5–1.9)

## 2018-10-17 LAB — MAGNESIUM: Magnesium: 1.9 mg/dL (ref 1.7–2.4)

## 2018-10-17 LAB — TSH: TSH: 2.346 u[IU]/mL (ref 0.350–4.500)

## 2018-10-17 LAB — TROPONIN I: Troponin I: 0.04 ng/mL (ref <0.03)

## 2018-10-17 LAB — LIPASE, BLOOD: Lipase: 21 U/L (ref 11–51)

## 2018-10-17 LAB — MRSA PCR SCREENING: MRSA by PCR: NEGATIVE

## 2018-10-17 LAB — PHOSPHORUS: Phosphorus: 4.3 mg/dL (ref 2.5–4.6)

## 2018-10-17 LAB — LACTIC ACID, PLASMA: LACTIC ACID, VENOUS: 3 mmol/L — AB (ref 0.5–1.9)

## 2018-10-17 MED ORDER — HEPARIN SODIUM (PORCINE) 1000 UNIT/ML IJ SOLN
4000.0000 [IU] | Freq: Once | INTRAMUSCULAR | Status: AC
  Administered 2018-10-17: 4000 [IU]
  Filled 2018-10-17: qty 4

## 2018-10-17 MED ORDER — MIRTAZAPINE 15 MG PO TABS
15.0000 mg | ORAL_TABLET | Freq: Every day | ORAL | Status: DC
  Administered 2018-10-17 – 2018-10-18 (×2): 15 mg via ORAL
  Filled 2018-10-17 (×2): qty 1

## 2018-10-17 MED ORDER — AMITRIPTYLINE HCL 25 MG PO TABS
25.0000 mg | ORAL_TABLET | Freq: Every day | ORAL | Status: DC
  Administered 2018-10-17 – 2018-10-18 (×2): 25 mg via ORAL
  Filled 2018-10-17 (×2): qty 1

## 2018-10-17 MED ORDER — MOMETASONE FURO-FORMOTEROL FUM 100-5 MCG/ACT IN AERO
2.0000 | INHALATION_SPRAY | Freq: Two times a day (BID) | RESPIRATORY_TRACT | Status: DC
  Administered 2018-10-17 – 2018-10-19 (×4): 2 via RESPIRATORY_TRACT
  Filled 2018-10-17 (×2): qty 8.8

## 2018-10-17 MED ORDER — METOPROLOL TARTRATE 25 MG PO TABS
12.5000 mg | ORAL_TABLET | Freq: Two times a day (BID) | ORAL | Status: DC
  Administered 2018-10-17: 12.5 mg via ORAL
  Administered 2018-10-18: 22:00:00 via ORAL
  Administered 2018-10-19: 12.5 mg via ORAL
  Filled 2018-10-17 (×3): qty 1

## 2018-10-17 MED ORDER — CALCIUM ACETATE (PHOS BINDER) 667 MG PO CAPS
1334.0000 mg | ORAL_CAPSULE | Freq: Three times a day (TID) | ORAL | Status: DC
  Administered 2018-10-18 – 2018-10-19 (×5): 1334 mg via ORAL
  Filled 2018-10-17 (×5): qty 2

## 2018-10-17 MED ORDER — ASPIRIN EC 81 MG PO TBEC
81.0000 mg | DELAYED_RELEASE_TABLET | Freq: Every day | ORAL | Status: DC
  Administered 2018-10-17 – 2018-10-19 (×3): 81 mg via ORAL
  Filled 2018-10-17 (×3): qty 1

## 2018-10-17 MED ORDER — INSULIN DETEMIR 100 UNIT/ML ~~LOC~~ SOLN
7.0000 [IU] | Freq: Every day | SUBCUTANEOUS | Status: DC
  Administered 2018-10-17 – 2018-10-18 (×2): 7 [IU] via SUBCUTANEOUS
  Filled 2018-10-17 (×3): qty 0.07

## 2018-10-17 MED ORDER — ALBUTEROL SULFATE (2.5 MG/3ML) 0.083% IN NEBU
2.5000 mg | INHALATION_SOLUTION | Freq: Four times a day (QID) | RESPIRATORY_TRACT | Status: DC | PRN
  Filled 2018-10-17: qty 3

## 2018-10-17 MED ORDER — ACETAMINOPHEN 325 MG PO TABS: 650.0000 mg | ORAL_TABLET | Freq: Four times a day (QID) | ORAL | Status: DC | PRN

## 2018-10-17 MED ORDER — VANCOMYCIN HCL IN DEXTROSE 1-5 GM/200ML-% IV SOLN
1000.0000 mg | Freq: Once | INTRAVENOUS | Status: AC
  Administered 2018-10-17: 1000 mg via INTRAVENOUS
  Filled 2018-10-17: qty 200

## 2018-10-17 MED ORDER — RENA-VITE PO TABS
1.0000 | ORAL_TABLET | Freq: Every day | ORAL | Status: DC
  Administered 2018-10-17 – 2018-10-18 (×2): 1 via ORAL
  Filled 2018-10-17 (×2): qty 1

## 2018-10-17 MED ORDER — MIDODRINE HCL 5 MG PO TABS: 5.0000 mg | ORAL_TABLET | Freq: Every day | ORAL | Status: DC

## 2018-10-17 MED ORDER — GABAPENTIN 300 MG PO CAPS
300.0000 mg | ORAL_CAPSULE | Freq: Every day | ORAL | Status: DC
  Administered 2018-10-17 – 2018-10-19 (×3): 300 mg via ORAL
  Filled 2018-10-17 (×3): qty 1

## 2018-10-17 MED ORDER — SODIUM CHLORIDE 0.9 % IV BOLUS
1000.0000 mL | Freq: Once | INTRAVENOUS | Status: AC
  Administered 2018-10-17: 1000 mL via INTRAVENOUS

## 2018-10-17 MED ORDER — ALBUMIN HUMAN 25 % IV SOLN
25.0000 g | Freq: Once | INTRAVENOUS | Status: AC
  Administered 2018-10-17: 25 g via INTRAVENOUS
  Filled 2018-10-17: qty 50
  Filled 2018-10-17: qty 100

## 2018-10-17 MED ORDER — CIPROFLOXACIN IN D5W 400 MG/200ML IV SOLN
400.0000 mg | INTRAVENOUS | Status: DC
  Administered 2018-10-17 – 2018-10-18 (×2): 400 mg via INTRAVENOUS
  Filled 2018-10-17 (×2): qty 200

## 2018-10-17 MED ORDER — SODIUM CHLORIDE 0.9 % IV BOLUS
500.0000 mL | Freq: Once | INTRAVENOUS | Status: AC
  Administered 2018-10-17: 500 mL via INTRAVENOUS

## 2018-10-17 MED ORDER — ONDANSETRON HCL 4 MG/2ML IJ SOLN: 4.0000 mg | Freq: Four times a day (QID) | INTRAMUSCULAR | Status: DC | PRN

## 2018-10-17 MED ORDER — PANTOPRAZOLE SODIUM 40 MG PO TBEC
40.0000 mg | DELAYED_RELEASE_TABLET | Freq: Every day | ORAL | Status: DC
  Administered 2018-10-17 – 2018-10-19 (×3): 40 mg via ORAL
  Filled 2018-10-17 (×3): qty 1

## 2018-10-17 MED ORDER — METRONIDAZOLE IN NACL 5-0.79 MG/ML-% IV SOLN
500.0000 mg | Freq: Three times a day (TID) | INTRAVENOUS | Status: DC
  Administered 2018-10-17 – 2018-10-19 (×5): 500 mg via INTRAVENOUS
  Filled 2018-10-17 (×5): qty 100

## 2018-10-17 MED ORDER — PIPERACILLIN-TAZOBACTAM 3.375 G IVPB 30 MIN
3.3750 g | Freq: Once | INTRAVENOUS | Status: AC
  Administered 2018-10-17: 3.375 g via INTRAVENOUS
  Filled 2018-10-17: qty 50

## 2018-10-17 MED ORDER — ONDANSETRON HCL 4 MG PO TABS: 4.0000 mg | ORAL_TABLET | Freq: Four times a day (QID) | ORAL | Status: DC | PRN

## 2018-10-17 MED ORDER — NEPRO/CARBSTEADY PO LIQD
237.0000 mL | Freq: Two times a day (BID) | ORAL | Status: DC
  Administered 2018-10-18 – 2018-10-19 (×3): 237 mL via ORAL

## 2018-10-17 MED ORDER — HEPARIN SODIUM (PORCINE) 5000 UNIT/ML IJ SOLN
5000.0000 [IU] | Freq: Three times a day (TID) | INTRAMUSCULAR | Status: DC
  Administered 2018-10-17 – 2018-10-19 (×5): 5000 [IU] via SUBCUTANEOUS
  Filled 2018-10-17 (×5): qty 1

## 2018-10-17 MED ORDER — ACETAMINOPHEN 650 MG RE SUPP: 650.0000 mg | Freq: Four times a day (QID) | RECTAL | Status: DC | PRN

## 2018-10-17 MED ORDER — CHLORHEXIDINE GLUCONATE CLOTH 2 % EX PADS
6.0000 | MEDICATED_PAD | Freq: Every day | CUTANEOUS | Status: DC
  Administered 2018-10-18 – 2018-10-19 (×2): 6 via TOPICAL

## 2018-10-17 MED ORDER — SODIUM CHLORIDE 0.9% FLUSH: 3.0000 mL | INTRAVENOUS | Status: DC | PRN

## 2018-10-17 MED ORDER — SODIUM CHLORIDE 0.9 % IV SOLN
250.0000 mL | INTRAVENOUS | Status: DC | PRN
  Administered 2018-10-19: 250 mL via INTRAVENOUS

## 2018-10-17 MED ORDER — SODIUM CHLORIDE 0.9% FLUSH
3.0000 mL | Freq: Two times a day (BID) | INTRAVENOUS | Status: DC
  Administered 2018-10-17 – 2018-10-19 (×4): 3 mL via INTRAVENOUS

## 2018-10-17 MED ORDER — SODIUM BICARBONATE 650 MG PO TABS
650.0000 mg | ORAL_TABLET | Freq: Two times a day (BID) | ORAL | Status: DC
  Administered 2018-10-17 – 2018-10-19 (×4): 650 mg via ORAL
  Filled 2018-10-17 (×4): qty 1

## 2018-10-17 MED ORDER — INSULIN ASPART 100 UNIT/ML ~~LOC~~ SOLN
0.0000 [IU] | Freq: Three times a day (TID) | SUBCUTANEOUS | Status: DC
  Administered 2018-10-18: 3 [IU] via SUBCUTANEOUS
  Administered 2018-10-18 (×2): 2 [IU] via SUBCUTANEOUS
  Administered 2018-10-19: 1 [IU] via SUBCUTANEOUS
  Administered 2018-10-19: 3 [IU] via SUBCUTANEOUS

## 2018-10-17 MED ORDER — OMEPRAZOLE MAGNESIUM 20 MG PO TBEC: 20.0000 mg | DELAYED_RELEASE_TABLET | Freq: Every day | ORAL | Status: DC

## 2018-10-17 NOTE — ED Notes (Signed)
Patient resting comfortably - still unable to provide urine specimen.

## 2018-10-17 NOTE — ED Triage Notes (Signed)
Pt brought to ED via [facility] for Hypotension. Pt went to [facility] for dialysis treatment today and BP noted to be low 84/45, 80/50. Pt c/o weakness and decreased appetite.

## 2018-10-17 NOTE — ED Notes (Signed)
Date and time results received: 10/17/18 1352  Test:lactic acid  Critical Value: 3.0  Name of Provider Notified: yelverton md  Orders Received? Or Actions Taken?: n/a

## 2018-10-17 NOTE — ED Notes (Signed)
CRITICAL VALUE ALERT  Critical Value:  Lactic 4.42  Date & Time Notied:  10/16/2018 - 1000  Provider Notified: Lita Mains  Orders Received/Actions taken: [place] White R.N. notified

## 2018-10-17 NOTE — H&P (Signed)
History and Physical    Steven [name] GLO:[id number] DOB: [birth year] DOA: 10/17/2018  Referring MD/NP/PA: Dr. 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 Presented with low blood pressure at dialysis today, reason why he didn't receive it and instead was referred to the emergency department. He states he has been experiencing abdominal cramps and diarrhea which he thinks are from his potassium lowering medications.  He has also had decreased oral intake due to nausea and vomiting.  Patient reported to be compliant with his medications including antihypertensive agents until the day prior to admission despite lack of appropriate oral intake and ongoing GI losses.  Patient denies headaches, lightheadedness, fevers or chills, hemoptysis, hematuria, dysuria, hematochezia, focal weakness or any other complaints..  In the ED patient was found with elevated lactic acid, soft blood pressure but responded appropriately to fluid resuscitation, CT scan of his abdomen demonstrating descending colon colitis; hyponatremia and metabolic acidosis.  Patient potassium was mildly elevated at 5.4; creatinine of 12.9 and BUN of 86.  Patient received fluid resuscitation, cultures were taken and he was started on antibiotics.  Prior hospital has been admitted for further evaluation and management.  Past Medical/Surgical History: End Stage Kidney Disease Diabetes Mellitus Hypertension Hyperlipidemia Morbid obesity GERD Anemia Sleep apnea  Past Surgical History:  Procedure Laterality Date  . A/V FISTULAGRAM Right 08/27/2017   Procedure: A/V FISTULAGRAM;  Surgeon: Katha Cabal, MD;  Location: [facility];  Service:  Cardiovascular;  Laterality: Right;  . A/V SHUNT INTERVENTION N/A 06/23/2018   Procedure: A/V SHUNT INTERVENTION;  Surgeon: Algernon Huxley, MD;  Location: [facility];  Service: Cardiovascular;  Laterality: N/A;  . APPENDECTOMY  2010  . AV FISTULA PLACEMENT Right 06/14/2017   Procedure: ARTERIOVENOUS (AV) FISTULA CREATION WRIST;  Surgeon: Katha Cabal, MD;  Location: [facility];  Service: Vascular;  Laterality: Right;  . DIALYSIS/PERMA CATHETER INSERTION N/A 03/05/2017   Procedure: Dialysis/Perma Catheter Insertion;  Surgeon: Katha Cabal, MD;  Location: [facility];  Service: Cardiovascular;  Laterality: N/A;  . DIALYSIS/PERMA CATHETER INSERTION N/A 09/20/2017   Procedure: DIALYSIS/PERMA CATHETER INSERTION;  Surgeon: Katha Cabal, MD;  Location: [facility];  Service: Cardiovascular;  Laterality: N/A;  . EXCHANGE OF A DIALYSIS CATHETER  03/29/2017   Procedure: Exchange Of A Dialysis Catheter;  Surgeon: Katha Cabal, MD;  Location: [facility];  Service: Cardiovascular;;  . IRRIGATION AND DEBRIDEMENT FOOT Right 03/01/2017   Procedure: IRRIGATION AND DEBRIDEMENT FOOT;  Surgeon: Albertine Patricia, DPM;  Location: [facility];  Service: Podiatry;  Laterality: Right;  application of wound vac  . PERIPHERAL VASCULAR THROMBECTOMY Right 06/18/2018   Procedure: PERIPHERAL VASCULAR THROMBECTOMY;  Surgeon: Algernon Huxley, MD;  Location: [facility];  Service: Cardiovascular;  Laterality: Right;    Social History:  reports that he has never smoked. His smokeless tobacco use includes snuff. He reports current alcohol use. He reports that he does not use drugs.  Allergies: Codeine  Family History:  CAD and Heart disease (Father) History of stroke and dementia (Mother, still alive 30 years old)   Prior to Admission medications   Medication Sig Start Date End Date Taking? Authorizing Provider  amitriptyline (ELAVIL)  25 MG tablet Take 25 mg by mouth  at bedtime.   Yes [provider]  furosemide (LASIX) 80 MG tablet Take 1 tablet (80 mg total) by mouth daily. Patient taking differently: Take 80 mg by mouth 2 (two) times daily.  03/08/17  Yes Vaughan Basta, MD  gabapentin (NEURONTIN) 300 MG capsule Take 1 capsule (300 mg total) by mouth 2 (two) times daily. Patient taking differently: Take 300 mg by mouth daily.  03/07/17  Yes Vaughan Basta, MD  glipiZIDE (GLUCOTROL) 5 MG tablet 5 mg daily before breakfast.  07/05/18  Yes [provider]  glucose blood (ACCU-CHEK AVIVA) test strip Use as instructed.use to check blood sugar up to 3 times a day  Disp: 1 box 08/20/14  Yes Alveda Reasons, MD  insulin aspart (NOVOLOG) 100 UNIT/ML injection Inject 12 Units into the skin 3 (three) times daily with meals. 09/14/18  Yes Fritzi Mandes, MD  insulin detemir (LEVEMIR) 100 UNIT/ML injection Inject 0.25 mLs (25 Units total) into the skin 2 (two) times daily. 09/21/17  Yes Epifanio Lesches, MD  Insulin Pen Needle 31G X 8 MM MISC 1 Container by Does not apply route QID. qid--any brand 08/20/14  Yes Alveda Reasons, MD  Insulin Syringes, Disposable, U-100 1 ML MISC Use as directed with insulin 08/20/14  Yes Alveda Reasons, MD  Lancets (ACCU-CHEK MULTICLIX) lancets Use as instructed.  check blood sugar up to 3 times a day Disp:  1 box 08/20/14  Yes Alveda Reasons, MD  metoprolol tartrate (LOPRESSOR) 25 MG tablet Take 1 tablet (25 mg total) by mouth 2 (two) times daily. 03/07/17  Yes Vaughan Basta, MD  midodrine (PROAMATINE) 5 MG tablet Take 5 mg by mouth daily as needed.  07/05/18  Yes [provider]  mirtazapine (REMERON) 15 MG tablet TK 1 T PO QD ATN 07/12/18  Yes [provider]  multivitamin (RENA-VIT) TABS tablet Take 1 tablet by mouth at bedtime. 06/24/18  Yes Salary, Avel Peace, MD  Nutritional Supplements (FEEDING SUPPLEMENT, NEPRO CARB STEADY,) LIQD Take 237 mLs by mouth 2 (two) times daily  between meals. 06/24/18  Yes Salary, Avel Peace, MD  omeprazole (PRILOSEC OTC) 20 MG tablet Take 20 mg by mouth daily.    Yes [provider]  PROAIR HFA 108 (90 Base) MCG/ACT inhaler Inhale 2 puffs into the lungs every 4 (four) hours as needed for wheezing or shortness of breath.  09/04/17  Yes [provider]  SYMBICORT 80-4.5 MCG/ACT inhaler Inhale 2 puffs into the lungs daily.  09/10/17  Yes [provider]  aspirin EC 81 MG tablet TAKE ONE  BY MOUTH EVERY DAY Patient not taking: TAKE ONE&nbsp;&nbsp;BY MOUTH EVERY DAY 09/15/12   Alveda Reasons, MD  calcium acetate (PHOSLO) 667 MG capsule Take 2 capsules (1,334 mg total) by mouth 3 (three) times daily with meals. Patient not taking: Reported on 10/17/2018 06/24/18   Salary, Avel Peace, MD    Review of Systems:  Constitutional: States decreased appetite due to nausea. Denies fever, chills, diaphoresis and fatigue. HEENT: Denies photophobia, eye pain, redness, hearing loss, ear pain, congestion, sore throat, rhinorrhea, sneezing, mouth sores, trouble swallowing, neck pain, neck stiffness and tinnitus.   Respiratory: Patient felt short of breath earlier today while walking. Denies cough, chest tightness and wheezing.   Cardiovascular: Denies chest pain, palpitations and leg swelling.  Gastrointestinal: Complains of  Nausea, abdominal pain and diarrhea. Denies hemoptysis and hematochezia  Genitourinary: Denies dysuria, urgency, frequency, hematuria, flank  pain and difficulty urinating.  Endocrine: Denies polyuria, polydipsia, hot or cold intolerance, sweats and changes in hair or nails. Musculoskeletal: Denies myalgias, back pain, joint swelling and  arthralgias. Skin: Denies pallor, rash and ecchymosis. Neurological: Denies dizziness, seizures, syncope, weakness, light-headedness, numbness and headaches.  Hematological: Denies history of easy bruising. Psychiatric/Behavioral: Denies suicidal ideation, mood changes,  confusion, nervousness, sleep disturbance or agitation   Physical Exam: Vitals:   10/17/18 1549 10/17/18 1550 10/17/18 1648 10/17/18 1700  BP: (!) 144/102 (!) 150/90  124/78  Pulse: (!) 116   74  Resp: 18     Temp: 98.2 F (36.8 C)     TempSrc: Oral     SpO2: 100%     Weight:   122.7 kg   Height:   5\' 10"  (1.778 m)     Constitutional: patient in no acute distress, calm and comfortable. Receiving hemodialysis at this time. Eyes: PERRL, lids and conjunctivae normal; no icterus, no nystagmus. ENMT: Mucous membranes are dry. Posterior pharynx clear of any exudate or lesions.  Neck:  no masses, no thyromegaly Respiratory: clear to auscultation bilaterally, no wheezing, no crackles, no rhonchi. Normal respiratory effort with no use of accessory muscles. Cardiovascular: Mild tachycardia, sinus rhythm, no murmurs, rubs, or gallops. Positive 2+ extremity edema bilaterally. No carotid bruits.  Abdomen:  Abdominal tenderness on palpation (left quadrant). No guarding or rebound. No masses palpated. No hepatosplenomegaly. Bowel sounds positive.  Obese abdomen. Musculoskeletal: no clubbing or cyanosis. No joint deformity in upper and lower extremities. Good range of movement. Normal muscle tone.  Skin: Left subclavian hemodialysis catheter in place.  No rashes, no petechiae, no open wounds. Neurologic: cranial nerves intact. Sensation intact, normal deep tendon reflexes. Strength 5/5 in all 4.  Psychiatric: Normal judgment and insight. Alert and oriented in time, place and person. Normal mood.    Labs on Admission: I have personally reviewed the following labs and imaging studies  CBC: Recent Labs  Lab 10/17/18 0921  WBC 8.5  HGB 11.1*  HCT 34.5*  MCV 94.3  PLT 811   Basic Metabolic Panel: Recent Labs  Lab 10/17/18 0921  NA 122*  K 5.4*  CL 87*  CO2 14*  GLUCOSE 171*  BUN 86*  CREATININE 12.97*  CALCIUM 7.8*   GFR: Estimated Creatinine Clearance: 8.4 mL/min (A) (by C-G  formula based on SCr of 12.97 mg/dL (H)).   Liver Function Tests: Recent Labs  Lab 10/17/18 0921  AST 58*  ALT 54*  ALKPHOS 291*  BILITOT 2.0*  PROT 6.4*  ALBUMIN 2.5*   Recent Labs  Lab 10/17/18 0921  LIPASE 21   Cardiac Enzymes: Recent Labs  Lab 10/17/18 0921  TROPONINI 0.04*   CBG: Recent Labs  Lab 10/17/18 0907 10/17/18 1623  GLUCAP 157* 153*   Urine analysis:    Component Value Date/Time   COLORURINE YELLOW 10/17/2018 1320   APPEARANCEUR CLEAR 10/17/2018 1320   LABSPEC 1.008 10/17/2018 1320   PHURINE 5.0 10/17/2018 1320   GLUCOSEU 50 (A) 10/17/2018 1320   HGBUR SMALL (A) 10/17/2018 1320   BILIRUBINUR NEGATIVE 10/17/2018 1320   KETONESUR NEGATIVE 10/17/2018 1320   PROTEINUR NEGATIVE 10/17/2018 1320   NITRITE NEGATIVE 10/17/2018 1320   LEUKOCYTESUR NEGATIVE 10/17/2018 1320    Recent Results (from the past 240 hour(s))  Culture, blood (Routine X 2) w Reflex to ID Panel     Status: None (Preliminary result)   Collection Time: 10/17/18  9:35 AM  Result Value Ref Range Status  Specimen Description BLOOD LEFT HAND  Final   Special Requests   Final    BOTTLES DRAWN AEROBIC AND ANAEROBIC Blood Culture results may not be optimal due to an inadequate volume of blood received in culture bottles Performed at [facility], [address] [place], [place] [address]    Culture PENDING  Incomplete   Report Status PENDING  Incomplete  Culture, blood (Routine X 2) w Reflex to ID Panel     Status: None (Preliminary result)   Collection Time: 10/17/18  9:36 AM  Result Value Ref Range Status   Specimen Description BLOOD LEFT HAND  Final   Special Requests   Final    AEROBIC BOTTLE ONLY Blood Culture results may not be optimal due to an inadequate volume of blood received in culture bottles Performed at [facility], [address], [place], [place] [address]    Culture PENDING  Incomplete   Report Status PENDING  Incomplete     Radiological Exams on Admission: Ct  Abdomen Pelvis Wo Contrast  Result Date: 10/17/2018 CLINICAL DATA:  Hypotension EXAM: CT ABDOMEN AND PELVIS WITHOUT CONTRAST TECHNIQUE: Multidetector CT imaging of the abdomen and pelvis was performed following the standard protocol without IV contrast. COMPARISON:  11/30/2006 FINDINGS: Lower chest: There is a 5.0 cm round mass at the posterior right lung base. Curvilinear pulmonary opacities extend towards the right hilum. This may simply represent round atelectasis, however malignancy is not excluded. There is an associated small pleural effusion. Left lung base is clear. Hepatobiliary: Diffuse hepatic steatosis. Gallbladder is within normal limits. Pancreas: Unremarkable Spleen: Unremarkable Adrenals/Urinary Tract: Adrenal glands are within normal limits. Chronic changes of the kidneys. No hydronephrosis. Mild diffuse bladder wall thickening. Stomach/Bowel: Stomach is decompressed. There is a segment of wall thickening involving the distal transverse colon. See image 34. The wall is somewhat irregular and measures up to 1.2 cm. There is an adjacent mildly dilated small bowel loops with an air-fluid level. Colon and small bowel are otherwise decompressed. Vascular/Lymphatic: There is no abnormal mesenteric or retroperitoneal adenopathy. Reproductive: Unremarkable prostate. Other: No free fluid. Musculoskeletal: No vertebral compression deformity. IMPRESSION: 5.0 cm mass in the posterior right lung base. Differential diagnosis includes malignancy versus round atelectasis. PET-CT is recommended. There is an associated small right pleural effusion. There is focal wall thickening in the distal transverse colon. This may simply reflect an inflammatory process, however malignancy can have a similar appearance. Colonoscopy is recommended. Single mildly dilated small bowel loop in the left upper quadrant is nonspecific. It does contain a air-fluid level. Wall thickening of the bladder. Correlate with urinalysis as for  the presence of an inflammatory process. Electronically Signed   By: Marybelle Killings M.D.   On: 10/17/2018 12:38   Dg Chest 2 View  Result Date: 10/17/2018 CLINICAL DATA:  Shortness of breath. EXAM: CHEST - 2 VIEW COMPARISON:  09/18/2017. FINDINGS: Dual-lumen catheter with tip over right atrium. Heart size normal. Mild right base atelectasis/infiltrate with right-sided pleural effusion. These findings are new from prior exam. No pneumothorax. IMPRESSION: 1.  Dual-lumen catheter with tip over right atrium. 2. New onset right base atelectasis/infiltrate right-sided pleural effusion. Electronically Signed   By: [place]   On: 10/17/2018 10:13    EKG: Independently reviewed.  Normal sinus rhythm, no signs of acute ischemia.  Overall Q waves appreciated on inferior leads.  Normal QT.  Assessment/Plan 1-Nausea/vomiting, abdominal pain; in setting of acute colitis. -CT scan demonstrated descending colon colitis -Patient is afebrile and with  normal WBCs currently -Start treatment with Cipro and Flagyl (IV initially due to presentation with nausea and ongoing vomiting). -Fluid resuscitation given in the ED; for now will allow full liquid and avoid further IV fluids in the setting of end-stage renal disease. -Follow clinical response -If he remains stable, will pursuit outpatient follow-up with GI for colonoscopy.  2-dehydration and hyponatremia -Due to poor oral intake and ongoing GI losses (diarrhea, nausea/vomiting). -Fluid resuscitation given in the ED -Started on as needed antiemetics -Patient will receive hemodialysis -Follow electrolytes trend  3-end Stage Kidney Disease: with mild hyperkalemia -Permanent HD catheter in his left subclavian -Nephrology has been consulted for dialysis management -Patient will receive dialysis today. -Follow electrolytes and further recommendations by renal service. -will monitor on telmetry  4-metabolic acidosis -Most likely associated with ongoing  chronic renal failure and diarrhea -Bicarb level 14 -Started on sodium bicarbonate 650 mg twice a day -Follow trend.  5-diabetes Mellitus -Started on low-dose Levemir -Sliding scale insulin -Will check A1c -Follow CBGs and further adjust hypoglycemic regimen as needed.  6-hypertension: With history of orthostatic hypotension and presenting with low blood pressure. -Most likely associated with ongoing dehydration and continue use of antihypertensive agents at home -We will discontinue Lasix for now -Metoprolol will be cut in half (to prevent rebound tachycardia) -Fluid resuscitation given in the ED and the plan is to perform dialysis without taking any fluid out. -Follow blood pressure and check orthostatic vital signs in a.m. -Will check TSH and cortisol level.  7-Hyperlipidemia -Continue heart healthy diet -Outpatient follow-up of his lipid profile with initiation of statins if needed.  8-Morbid obesity -Body mass index is 38.81 kg/m.  -Low calorie diet, increase physical activity and portion control has been discussed with patient.  9-GERD -Continue PPI  10-anemia of chronic kidney disease -Hemoglobin stable. -Follow trend -No signs of bleeding. -IV iron and Epogen therapy as per renal service discretion.    11-COPD -Stable -Continue Dulera and as needed albuterol -Currently no wheezing.    12-Sleep apnea -not on CPAP.   DVT prophylaxis: Heparin Code Status: Full Family Communication: no family members at bedside. Disposition Plan: Anticipate discharge home once able to keep things by mouth and overall clinical condition improved. Consults called: Nephrology service Admission status: Inpatient, length of state more than 2 midnights; telemetry bed.   Time Spent: 65 minutes  Barton Dubois MD [facility] Pager [phone]  If 7PM-7AM, please contact night-coverage [email or web address]  10/17/2018, 5:14 PM

## 2018-10-17 NOTE — Progress Notes (Signed)
Patient admitted to 309. Vitals are stable at this time, and he is awake, orientated and able move all 4 ext. He denies pain. HD RN has contacted me for report for him, she is en route to complete HD. Will continue to monitor closely.

## 2018-10-17 NOTE — Consult Note (Signed)
Reason for Consult: ESRD Referring Physician:  Dr. Lita Mains  Chief Complaint:  Hypotension at dialysis  Assessment/Plan: 58 y.o. male with hypertension, diabetes mellitus type 2 insulin dependent, hyperlipidemia, GERD, and morbid obesity  [facility]/125kg.  1.  ESRD on HD TTS.   - Plan on dialyzing today and then tomorrow as well as tolerated. - Hopefully he remains stable and can then f/u with his outpt unit next Tues.  2.  Hyponatremia.  Serum sodium currently 125.  Suspect that most of his hyponatremia is secondary to fluid indiscretions + alcohol.  3.  Anemia of chronic kidney disease.  Hemoglobin currently 11.1 @ goal. No ESA's needed at this time.  4.  Secondary hyperparathyroidism.  Recheck phosphorus; cont binder for now and will adjust as needed.  5. 5.0 cm mass in the posterior right lung base. Differential diagnosis includes malignancy versus round atelectasis. PET-CT is recommended.  6. ?Infiltrate on CXR    HPI: Steven [name] is an 58 y.o. male with a h/o DM HTN GERD HLD obesity ESRD TTS @ [facility] but last HD was last Fri. He went to a town close to [place] but unfortunately they pulled thrombus and couldn't dialyze him. No attempt was made to declot him and by the time he returned to the [employer] vicinity it was too late. He actually had a tunneled catheter placed 2 days ago at [place] but unfortunately his BP was on the low side at outpt HD this AM and he was not dialyzed. In the ED his BP was in the 90-110's range and he did c/o mild dyspnea/n but no vomiting. He denies CP, but has had mild abd discomfort bec of Kayexalate that was given by his nephrologist 1-2 days ago.   ROS Pertinent items are noted in HPI.  Chemistry and CBC: Creat  Date/Time Value Ref Range Status  12/07/2011 10:30 AM 1.41 (H) 0.50 - 1.35 mg/dL Final   Creatinine, Ser  Date/Time Value Ref Range Status  10/17/2018 09:21 AM 12.97 (H) 0.61 - 1.24 mg/dL Final   09/13/2018 06:06 AM 7.25 (H) 0.61 - 1.24 mg/dL Final  09/12/2018 12:50 PM 8.14 (H) 0.61 - 1.24 mg/dL Final  09/12/2018 11:26 AM 8.02 (H) 0.61 - 1.24 mg/dL Final  06/22/2018 06:03 AM 5.90 (H) 0.61 - 1.24 mg/dL Final  06/21/2018 07:45 AM 8.01 (H) 0.61 - 1.24 mg/dL Final  06/21/2018 02:52 AM 7.73 (H) 0.61 - 1.24 mg/dL Final  06/20/2018 10:53 PM 7.85 (H) 0.61 - 1.24 mg/dL Final  06/20/2018 07:08 PM 7.44 (H) 0.61 - 1.24 mg/dL Final  06/20/2018 01:00 AM 7.02 (H) 0.61 - 1.24 mg/dL Final  06/19/2018 08:30 PM 8.79 (H) 0.61 - 1.24 mg/dL Final  06/19/2018 04:34 PM 8.83 (H) 0.61 - 1.24 mg/dL Final  06/19/2018 01:08 PM 8.80 (H) 0.61 - 1.24 mg/dL Final  09/20/2017 03:44 AM 7.42 (H) 0.61 - 1.24 mg/dL Final  09/19/2017 05:51 AM 6.78 (H) 0.61 - 1.24 mg/dL Final  09/19/2017 01:59 AM 6.40 (H) 0.61 - 1.24 mg/dL Final  09/18/2017 08:45 PM 10.11 (H) 0.61 - 1.24 mg/dL Final  09/18/2017 03:12 PM 9.97 (H) 0.61 - 1.24 mg/dL Final  09/18/2017 12:05 PM 9.92 (H) 0.61 - 1.24 mg/dL Final  05/23/2017 11:03 AM 5.72 (H) 0.61 - 1.24 mg/dL Final  03/30/2017 01:05 PM 6.90 (H) 0.61 - 1.24 mg/dL Final  03/05/2017 02:00 PM 4.89 (H) 0.61 - 1.24 mg/dL Final  03/02/2017 08:45 AM 5.73 (H) 0.61 - 1.24 mg/dL Final  03/01/2017 12:54 PM 6.03 (H)  0.61 - 1.24 mg/dL Final  02/28/2017 04:43 AM 8.03 (H) 0.61 - 1.24 mg/dL Final  02/27/2017 05:18 PM 8.24 (H) 0.61 - 1.24 mg/dL Final  06/28/2010 06:09 PM 1.27 0.40 - 1.50 mg/dL Final  10/25/2009 08:46 PM 1.40 0.40 - 1.50 mg/dL Final  01/24/2009 08:25 PM 1.48 0.40 - 1.50 mg/dL Final  12/16/2008 08:26 PM 1.61 (H) 0.40 - 1.50 mg/dL Final  04/19/2008 09:02 PM 1.54 (H) 0.40 - 1.50 mg/dL Final   Recent Labs  Lab 10/17/18 0921  NA 122*  K 5.4*  CL 87*  CO2 14*  GLUCOSE 171*  BUN 86*  CREATININE 12.97*  CALCIUM 7.8*   Recent Labs  Lab 10/17/18 0921  WBC 8.5  HGB 11.1*  HCT 34.5*  MCV 94.3  PLT 152   Liver Function Tests: Recent Labs  Lab 10/17/18 0921  AST 58*  ALT 54*   ALKPHOS 291*  BILITOT 2.0*  PROT 6.4*  ALBUMIN 2.5*   Recent Labs  Lab 10/17/18 0921  LIPASE 21   No results for input(s): AMMONIA in the last 168 hours. Cardiac Enzymes: Recent Labs  Lab 10/17/18 0921  TROPONINI 0.04*   Iron Studies: No results for input(s): IRON, TIBC, TRANSFERRIN, FERRITIN in the last 72 hours. PT/INR: @LABRCNTIP (inr:5)  Xrays/Other Studies: ) Results for orders placed or performed during the hospital encounter of 10/17/18 (from the past 48 hour(s))  CBG monitoring, ED     Status: Abnormal   Collection Time: 10/17/18  9:07 AM  Result Value Ref Range   Glucose-Capillary 157 (H) 70 - 99 mg/dL  CBC     Status: Abnormal   Collection Time: 10/17/18  9:21 AM  Result Value Ref Range   WBC 8.5 4.0 - 10.5 K/uL   RBC 3.66 (L) 4.22 - 5.81 MIL/uL   Hemoglobin 11.1 (L) 13.0 - 17.0 g/dL   HCT 34.5 (L) 39.0 - 52.0 %   MCV 94.3 80.0 - 100.0 fL   MCH 30.3 26.0 - 34.0 pg   MCHC 32.2 30.0 - 36.0 g/dL   RDW 15.5 11.5 - 15.5 %   Platelets 152 150 - 400 K/uL   nRBC 0.0 0.0 - 0.2 %    Comment: Performed at [facility], [address] [place], [place] [address]  Comprehensive metabolic panel     Status: Abnormal   Collection Time: 10/17/18  9:21 AM  Result Value Ref Range   Sodium 122 (L) 135 - 145 mmol/L   Potassium 5.4 (H) 3.5 - 5.1 mmol/L   Chloride 87 (L) 98 - 111 mmol/L   CO2 14 (L) 22 - 32 mmol/L   Glucose, Bld 171 (H) 70 - 99 mg/dL   BUN 86 (H) 6 - 20 mg/dL   Creatinine, Ser 12.97 (H) 0.61 - 1.24 mg/dL   Calcium 7.8 (L) 8.9 - 10.3 mg/dL   Total Protein 6.4 (L) 6.5 - 8.1 g/dL   Albumin 2.5 (L) 3.5 - 5.0 g/dL   AST 58 (H) 15 - 41 U/L   ALT 54 (H) 0 - 44 U/L   Alkaline Phosphatase 291 (H) 38 - 126 U/L   Total Bilirubin 2.0 (H) 0.3 - 1.2 mg/dL   GFR calc non Af Amer 4 (L) >60 mL/min   GFR calc Af Amer 4 (L) >60 mL/min    Comment: Performed at [facility], [address], [place], [place] [address]  Lipase, blood     Status: None   Collection Time:  10/17/18  9:21 AM  Result Value  Ref Range   Lipase 21 11 - 51 U/L    Comment: Performed at [facility], [address], [name], [place] [address]  Troponin I - ONCE - STAT     Status: Abnormal   Collection Time: 10/17/18  9:21 AM  Result Value Ref Range   Troponin I 0.04 (HH) <0.03 ng/mL    Comment: CRITICAL RESULT CALLED TO, READ BACK BY AND VERIFIED WITH: WHITE,M AT 10:25AM ON 10/17/18 BY Derrill Memo Performed at [facility], [address], [place], [place] [address]   Culture, blood (Routine X 2) w Reflex to ID Panel     Status: None (Preliminary result)   Collection Time: 10/17/18  9:35 AM  Result Value Ref Range   Specimen Description BLOOD LEFT HAND    Special Requests      BOTTLES DRAWN AEROBIC AND ANAEROBIC Blood Culture results may not be optimal due to an inadequate volume of blood received in culture bottles Performed at [facility], [address] [place], [place] [address]    Culture PENDING    Report Status PENDING   Culture, blood (Routine X 2) w Reflex to ID Panel     Status: None (Preliminary result)   Collection Time: 10/17/18  9:36 AM  Result Value Ref Range   Specimen Description BLOOD LEFT HAND    Special Requests      AEROBIC BOTTLE ONLY Blood Culture results may not be optimal due to an inadequate volume of blood received in culture bottles Performed at [facility], [address], [place], [place] [address]    Culture PENDING    Report Status PENDING   I-Stat CG4 Lactic Acid, ED     Status: Abnormal   Collection Time: 10/17/18  9:56 AM  Result Value Ref Range   Lactic Acid, Venous 4.42 (HH) 0.5 - 1.9 mmol/L   Comment NOTIFIED PHYSICIAN   Lactic acid, plasma     Status: Abnormal   Collection Time: 10/17/18 12:57 PM  Result Value Ref Range   Lactic Acid, Venous 3.0 (HH) 0.5 - 1.9 mmol/L    Comment: CRITICAL RESULT CALLED TO, READ BACK BY AND VERIFIED WITH: HOLCOMB R. AT 1350 ON [id number] BY THOMPSON S. Performed at [facility], [address],  [place], [place] [address]   Urinalysis, Routine w reflex microscopic     Status: Abnormal   Collection Time: 10/17/18  1:20 PM  Result Value Ref Range   Color, Urine YELLOW YELLOW   APPearance CLEAR CLEAR   Specific Gravity, Urine 1.008 1.005 - 1.030   pH 5.0 5.0 - 8.0   Glucose, UA 50 (A) NEGATIVE mg/dL   Hgb urine dipstick SMALL (A) NEGATIVE   Bilirubin Urine NEGATIVE NEGATIVE   Ketones, ur NEGATIVE NEGATIVE mg/dL   Protein, ur NEGATIVE NEGATIVE mg/dL   Nitrite NEGATIVE NEGATIVE   Leukocytes, UA NEGATIVE NEGATIVE   RBC / HPF 0-5 0 - 5 RBC/hpf   WBC, UA 0-5 0 - 5 WBC/hpf   Bacteria, UA NONE SEEN NONE SEEN    Comment: Performed at [facility], [address], [place], [place] [address]   Ct Abdomen Pelvis Wo Contrast  Result Date: 10/17/2018 CLINICAL DATA:  Hypotension EXAM: CT ABDOMEN AND PELVIS WITHOUT CONTRAST TECHNIQUE: Multidetector CT imaging of the abdomen and pelvis was performed following the standard protocol without IV contrast. COMPARISON:  11/30/2006 FINDINGS: Lower chest: There is a 5.0 cm round mass at the posterior right lung base. Curvilinear pulmonary opacities extend towards the right hilum. This  may simply represent round atelectasis, however malignancy is not excluded. There is an associated small pleural effusion. Left lung base is clear. Hepatobiliary: Diffuse hepatic steatosis. Gallbladder is within normal limits. Pancreas: Unremarkable Spleen: Unremarkable Adrenals/Urinary Tract: Adrenal glands are within normal limits. Chronic changes of the kidneys. No hydronephrosis. Mild diffuse bladder wall thickening. Stomach/Bowel: Stomach is decompressed. There is a segment of wall thickening involving the distal transverse colon. See image 34. The wall is somewhat irregular and measures up to 1.2 cm. There is an adjacent mildly dilated small bowel loops with an air-fluid level. Colon and small bowel are otherwise decompressed. Vascular/Lymphatic: There is no abnormal mesenteric or  retroperitoneal adenopathy. Reproductive: Unremarkable prostate. Other: No free fluid. Musculoskeletal: No vertebral compression deformity. IMPRESSION: 5.0 cm mass in the posterior right lung base. Differential diagnosis includes malignancy versus round atelectasis. PET-CT is recommended. There is an associated small right pleural effusion. There is focal wall thickening in the distal transverse colon. This may simply reflect an inflammatory process, however malignancy can have a similar appearance. Colonoscopy is recommended. Single mildly dilated small bowel loop in the left upper quadrant is nonspecific. It does contain a air-fluid level. Wall thickening of the bladder. Correlate with urinalysis as for the presence of an inflammatory process. Electronically Signed   By: Marybelle Killings M.D.   On: 10/17/2018 12:38   Dg Chest 2 View  Result Date: 10/17/2018 CLINICAL DATA:  Shortness of breath. EXAM: CHEST - 2 VIEW COMPARISON:  09/18/2017. FINDINGS: Dual-lumen catheter with tip over right atrium. Heart size normal. Mild right base atelectasis/infiltrate with right-sided pleural effusion. These findings are new from prior exam. No pneumothorax. IMPRESSION: 1.  Dual-lumen catheter with tip over right atrium. 2. New onset right base atelectasis/infiltrate right-sided pleural effusion. Electronically Signed   By: Marcello Moores  Register   On: 10/17/2018 10:13    PMH:   Past Medical History:  Diagnosis Date  . Anemia   . Cellulitis and abscess of right lower extremity 03/01/2017  . Chronic kidney disease    dialysis m,w,f  . Diabetes mellitus without complication ([place])   . Edema extremities 03/01/2017   bilateral swelling  . GERD (gastroesophageal reflux disease)   . High cholesterol   . High triglycerides   . Hypertension   . Neuropathy   . Sleep apnea    can't use cpap d/t feelings of suffocation    PSH:   Past Surgical History:  Procedure Laterality Date  . A/V FISTULAGRAM Right 08/27/2017    Procedure: A/V FISTULAGRAM;  Surgeon: Katha Cabal, MD;  Location: [place] [facility];  Service: Cardiovascular;  Laterality: Right;  . A/V SHUNT INTERVENTION N/A 06/23/2018   Procedure: A/V SHUNT INTERVENTION;  Surgeon: Algernon Huxley, MD;  Location: [place] [facility];  Service: Cardiovascular;  Laterality: N/A;  . APPENDECTOMY  2010  . AV FISTULA PLACEMENT Right 06/14/2017   Procedure: ARTERIOVENOUS (AV) FISTULA CREATION WRIST;  Surgeon: Katha Cabal, MD;  Location: [facility];  Service: Vascular;  Laterality: Right;  . DIALYSIS/PERMA CATHETER INSERTION N/A 03/05/2017   Procedure: Dialysis/Perma Catheter Insertion;  Surgeon: Katha Cabal, MD;  Location: [place] [facility];  Service: Cardiovascular;  Laterality: N/A;  . DIALYSIS/PERMA CATHETER INSERTION N/A 09/20/2017   Procedure: DIALYSIS/PERMA CATHETER INSERTION;  Surgeon: Katha Cabal, MD;  Location: [place] [facility];  Service: Cardiovascular;  Laterality: N/A;  . EXCHANGE OF A DIALYSIS CATHETER  03/29/2017   Procedure: Exchange Of A Dialysis Catheter;  Surgeon: Hortencia Pilar  G, MD;  Location: [place] [facility];  Service: Cardiovascular;;  . IRRIGATION AND DEBRIDEMENT FOOT Right 03/01/2017   Procedure: IRRIGATION AND DEBRIDEMENT FOOT;  Surgeon: Albertine Patricia, DPM;  Location: [facility];  Service: Podiatry;  Laterality: Right;  application of wound vac  . PERIPHERAL VASCULAR THROMBECTOMY Right 06/18/2018   Procedure: PERIPHERAL VASCULAR THROMBECTOMY;  Surgeon: Algernon Huxley, MD;  Location: [place] [facility];  Service: Cardiovascular;  Laterality: Right;    Allergies:  Allergies  Allergen Reactions  . Codeine Nausea And Vomiting    Medications:   Prior to Admission medications   Medication Sig Start Date End Date Taking? Authorizing Provider  amitriptyline (ELAVIL) 25 MG tablet Take 25 mg by mouth at bedtime.   Yes [provider]  furosemide (LASIX) 80 MG tablet Take 1 tablet (80 mg total) by  mouth daily. Patient taking differently: Take 80 mg by mouth 2 (two) times daily.  03/08/17  Yes Vaughan Basta, MD  gabapentin (NEURONTIN) 300 MG capsule Take 1 capsule (300 mg total) by mouth 2 (two) times daily. Patient taking differently: Take 300 mg by mouth daily.  03/07/17  Yes Vaughan Basta, MD  glipiZIDE (GLUCOTROL) 5 MG tablet 5 mg daily before breakfast.  07/05/18  Yes [provider]  glucose blood (ACCU-CHEK AVIVA) test strip Use as instructed.use to check blood sugar up to 3 times a day  Disp: 1 box 08/20/14  Yes Alveda Reasons, MD  insulin aspart (NOVOLOG) 100 UNIT/ML injection Inject 12 Units into the skin 3 (three) times daily with meals. 09/14/18  Yes Fritzi Mandes, MD  insulin detemir (LEVEMIR) 100 UNIT/ML injection Inject 0.25 mLs (25 Units total) into the skin 2 (two) times daily. 09/21/17  Yes Epifanio Lesches, MD  Insulin Pen Needle 31G X 8 MM MISC 1 Container by Does not apply route QID. qid--any brand 08/20/14  Yes Alveda Reasons, MD  Insulin Syringes, Disposable, U-100 1 ML MISC Use as directed with insulin 08/20/14  Yes Alveda Reasons, MD  Lancets (ACCU-CHEK MULTICLIX) lancets Use as instructed.  check blood sugar up to 3 times a day Disp:  1 box 08/20/14  Yes Alveda Reasons, MD  metoprolol tartrate (LOPRESSOR) 25 MG tablet Take 1 tablet (25 mg total) by mouth 2 (two) times daily. 03/07/17  Yes Vaughan Basta, MD  midodrine (PROAMATINE) 5 MG tablet Take 5 mg by mouth daily as needed.  07/05/18  Yes [provider]  mirtazapine (REMERON) 15 MG tablet TK 1 T PO QD ATN 07/12/18  Yes [provider]  multivitamin (RENA-VIT) TABS tablet Take 1 tablet by mouth at bedtime. 06/24/18  Yes Salary, Avel Peace, MD  Nutritional Supplements (FEEDING SUPPLEMENT, NEPRO CARB STEADY,) LIQD Take 237 mLs by mouth 2 (two) times daily between meals. 06/24/18  Yes Salary, Avel Peace, MD  omeprazole (PRILOSEC OTC) 20 MG tablet Take 20 mg by  mouth daily.    Yes [provider]  PROAIR HFA 108 (90 Base) MCG/ACT inhaler Inhale 2 puffs into the lungs every 4 (four) hours as needed for wheezing or shortness of breath.  09/04/17  Yes [provider]  SYMBICORT 80-4.5 MCG/ACT inhaler Inhale 2 puffs into the lungs daily.  09/10/17  Yes [provider]  aspirin EC 81 MG tablet TAKE ONE  BY MOUTH EVERY DAY Patient not taking: TAKE ONE&nbsp;&nbsp;BY MOUTH EVERY DAY 09/15/12   Alveda Reasons, MD  calcium acetate (PHOSLO) 667 MG capsule Take 2 capsules (1,334 mg total)  by mouth 3 (three) times daily with meals. Patient not taking: Reported on 10/17/2018 06/24/18   Salary, Avel Peace, MD    Discontinued Meds:  There are no discontinued medications.  Social History:  reports that he has never smoked. His smokeless tobacco use includes snuff. He reports current alcohol use. He reports that he does not use drugs.  Family History:   Family History  Problem Relation Age of Onset  . CAD Father   . Heart disease Father     Blood pressure 99/84, pulse 83, temperature (!) 97.5 F (36.4 C), temperature source Oral, resp. rate 18, height 5' 10.5" (1.791 m), weight 122.7 kg, SpO2 99 %. General appearance: alert, cooperative and appears stated age Head: Normocephalic, without obvious abnormality, atraumatic Eyes: negative Neck: no adenopathy, no carotid bruit, supple, symmetrical, trachea midline and thyroid not enlarged, symmetric, no tenderness/mass/nodules Back: symmetric, no curvature. ROM normal. No CVA tenderness. Resp: rales bibasilar Chest wall: no tenderness Cardio: regular rate and rhythm, S1, S2 normal, no murmur, click, rub or gallop GI: soft, non-tender; bowel sounds normal; no masses,  no organomegaly Extremities: edema tr-1+ Pulses: 2+ and symmetric Skin: Skin color, texture, turgor normal. No rashes or lesions Lymph nodes: Cervical, supraclavicular, and axillary nodes normal. Neurologic: Grossly  normal       Larayah Clute, Hunt Oris, MD 10/17/2018, 1:55 PM

## 2018-10-17 NOTE — Progress Notes (Signed)
PHARMACY NOTE:  ANTIMICROBIAL RENAL DOSAGE ADJUSTMENT  Current antimicrobial regimen includes a mismatch between antimicrobial dosage and estimated renal function.  As per policy approved by the Pharmacy & Therapeutics and Medical Executive Committees, the antimicrobial dosage will be adjusted accordingly.  Current antimicrobial dosage:  Cipro 400mg  IV q12h  Indication: Intra-abdominal infection  Renal Function:  Estimated Creatinine Clearance: 8.4 mL/min (A) (by C-G formula based on SCr of 12.97 mg/dL (H)). []      On intermittent HD, scheduled: []      On CRRT    Antimicrobial dosage has been changed to:  Cipro 400mg  IV q24h after dialysis  Additional comments:   Thank you for allowing pharmacy to be a part of this patient's care.  Vernie Ammons, [facility] 10/17/2018 5:53 PM

## 2018-10-17 NOTE — ED Notes (Signed)
CRITICAL VALUE ALERT  Critical Value:  Troponin 0.04  Date & Time Notied:  10/17/18 1020  Provider Notified: Dr. Lita Mains  Orders Received/Actions taken: EDP notified

## 2018-10-17 NOTE — ED Provider Notes (Signed)
[facility] EMERGENCY DEPARTMENT Provider Note   CSN: [id number] Arrival date & time: 10/17/18  0300     History   Chief Complaint Chief Complaint  Patient presents with  . Hypotension    HPI JORMA TASSINARI is a 58 y.o. male.  HPI Patient has history of end-stage renal disease.  He is on hemodialysis Monday, Wednesday and Friday.  Has not been dialyzed for the last week.  Went to dialysis today but did not receive dialysis due to low blood pressure.  Was referred to the emergency department.  States he is been taking medication for his elevated potassium which is causing him to have diarrhea.  States he is also been having abdominal cramps.  Has had decreased oral intake due to nausea.  No blood in the vomit.  No fever or chills.  Denies lightheadedness. Past Medical History:  Diagnosis Date  . Anemia   . Cellulitis and abscess of right lower extremity 03/01/2017  . Chronic kidney disease    dialysis m,w,f  . Diabetes mellitus without complication ([place])   . Edema extremities 03/01/2017   bilateral swelling  . GERD (gastroesophageal reflux disease)   . High cholesterol   . High triglycerides   . Hypertension   . Neuropathy   . Sleep apnea    can't use cpap d/t feelings of suffocation    Patient Active Problem List   Diagnosis Date Noted  . Colitis 10/17/2018  . Hyponatremia 09/12/2018  . DKA (diabetic ketoacidoses) ([place]) 09/18/2017  . End stage renal disease ([place]) 04/07/2017  . Complication of renal dialysis 04/07/2017  . Cellulitis in diabetic foot ([place]) 02/27/2017  . Acute on chronic renal failure ([place]) 02/27/2017  . Cellulitis 02/27/2017  . DIABETIC  RETINOPATHY 03/03/2009  . SLEEP APNEA 01/24/2009  . EDEMA 12/16/2008  . Diabetes mellitus type 2 in obese ([place]) 02/19/2008  . Hyperlipidemia 02/19/2008  . Morbid obesity ([place]) 02/19/2008  . DEPRESSION 02/19/2008  . Essential hypertension 06/17/2007  . GERD 06/17/2007    Past Surgical History:  Procedure  Laterality Date  . A/V FISTULAGRAM Right 08/27/2017   Procedure: A/V FISTULAGRAM;  Surgeon: Katha Cabal, MD;  Location: [place] [facility];  Service: Cardiovascular;  Laterality: Right;  . A/V SHUNT INTERVENTION N/A 06/23/2018   Procedure: A/V SHUNT INTERVENTION;  Surgeon: Algernon Huxley, MD;  Location: [place] [facility];  Service: Cardiovascular;  Laterality: N/A;  . APPENDECTOMY  2010  . AV FISTULA PLACEMENT Right 06/14/2017   Procedure: ARTERIOVENOUS (AV) FISTULA CREATION WRIST;  Surgeon: Katha Cabal, MD;  Location: [facility];  Service: Vascular;  Laterality: Right;  . DIALYSIS/PERMA CATHETER INSERTION N/A 03/05/2017   Procedure: Dialysis/Perma Catheter Insertion;  Surgeon: Katha Cabal, MD;  Location: [place] [facility];  Service: Cardiovascular;  Laterality: N/A;  . DIALYSIS/PERMA CATHETER INSERTION N/A 09/20/2017   Procedure: DIALYSIS/PERMA CATHETER INSERTION;  Surgeon: Katha Cabal, MD;  Location: [place] [facility];  Service: Cardiovascular;  Laterality: N/A;  . EXCHANGE OF A DIALYSIS CATHETER  03/29/2017   Procedure: Exchange Of A Dialysis Catheter;  Surgeon: Katha Cabal, MD;  Location: [place] [facility];  Service: Cardiovascular;;  . IRRIGATION AND DEBRIDEMENT FOOT Right 03/01/2017   Procedure: IRRIGATION AND DEBRIDEMENT FOOT;  Surgeon: Albertine Patricia, DPM;  Location: [facility];  Service: Podiatry;  Laterality: Right;  application of wound vac  . PERIPHERAL VASCULAR THROMBECTOMY Right 06/18/2018   Procedure: PERIPHERAL VASCULAR THROMBECTOMY;  Surgeon: Algernon Huxley, MD;  Location:  [place] [facility];  Service: Cardiovascular;  Laterality: Right;        Home Medications    Prior to Admission medications   Medication Sig Start Date End Date Taking? Authorizing Provider  amitriptyline (ELAVIL) 25 MG tablet Take 25 mg by mouth at bedtime.   Yes [provider]  furosemide (LASIX) 80 MG tablet Take 1 tablet (80 mg total) by mouth  daily. Patient taking differently: Take 80 mg by mouth 2 (two) times daily.  03/08/17  Yes Vaughan Basta, MD  gabapentin (NEURONTIN) 300 MG capsule Take 1 capsule (300 mg total) by mouth 2 (two) times daily. Patient taking differently: Take 300 mg by mouth daily.  03/07/17  Yes Vaughan Basta, MD  glipiZIDE (GLUCOTROL) 5 MG tablet 5 mg daily before breakfast.  07/05/18  Yes [provider]  glucose blood (ACCU-CHEK AVIVA) test strip Use as instructed.use to check blood sugar up to 3 times a day  Disp: 1 box 08/20/14  Yes Alveda Reasons, MD  insulin aspart (NOVOLOG) 100 UNIT/ML injection Inject 12 Units into the skin 3 (three) times daily with meals. 09/14/18  Yes Fritzi Mandes, MD  insulin detemir (LEVEMIR) 100 UNIT/ML injection Inject 0.25 mLs (25 Units total) into the skin 2 (two) times daily. 09/21/17  Yes Epifanio Lesches, MD  Insulin Pen Needle 31G X 8 MM MISC 1 Container by Does not apply route QID. qid--any brand 08/20/14  Yes Alveda Reasons, MD  Insulin Syringes, Disposable, U-100 1 ML MISC Use as directed with insulin 08/20/14  Yes Alveda Reasons, MD  Lancets (ACCU-CHEK MULTICLIX) lancets Use as instructed.  check blood sugar up to 3 times a day Disp:  1 box 08/20/14  Yes Alveda Reasons, MD  metoprolol tartrate (LOPRESSOR) 25 MG tablet Take 1 tablet (25 mg total) by mouth 2 (two) times daily. 03/07/17  Yes Vaughan Basta, MD  midodrine (PROAMATINE) 5 MG tablet Take 5 mg by mouth daily as needed.  07/05/18  Yes [provider]  mirtazapine (REMERON) 15 MG tablet TK 1 T PO QD ATN 07/12/18  Yes [provider]  multivitamin (RENA-VIT) TABS tablet Take 1 tablet by mouth at bedtime. 06/24/18  Yes Salary, Avel Peace, MD  Nutritional Supplements (FEEDING SUPPLEMENT, NEPRO CARB STEADY,) LIQD Take 237 mLs by mouth 2 (two) times daily between meals. 06/24/18  Yes Salary, Avel Peace, MD  omeprazole (PRILOSEC OTC) 20 MG tablet Take 20 mg by mouth  daily.    Yes [provider]  PROAIR HFA 108 (90 Base) MCG/ACT inhaler Inhale 2 puffs into the lungs every 4 (four) hours as needed for wheezing or shortness of breath.  09/04/17  Yes [provider]  SYMBICORT 80-4.5 MCG/ACT inhaler Inhale 2 puffs into the lungs daily.  09/10/17  Yes [provider]  aspirin EC 81 MG tablet TAKE ONE  BY MOUTH EVERY DAY Patient not taking: TAKE ONE  BY MOUTH EVERY DAY 09/15/12   Alveda Reasons, MD  calcium acetate (PHOSLO) 667 MG capsule Take 2 capsules (1,334 mg total) by mouth 3 (three) times daily with meals. Patient not taking: Reported on 10/17/2018 06/24/18   Salary, Avel Peace, MD    Family History Family History  Problem Relation Age of Onset  . CAD Father   . Heart disease Father     Social History Social History   Tobacco Use  . Smoking status: Never Smoker  . Smokeless tobacco: Current User    Types: Snuff  Substance Use Topics  . 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 Drug use: No     Allergies   Codeine   Review of Systems Review of Systems  Constitutional: Negative for chills and fever.  Eyes: Negative for visual disturbance.  Respiratory: Negative for cough and shortness of breath.   Cardiovascular: Negative for chest pain.  Gastrointestinal: Positive for abdominal pain, diarrhea and nausea. Negative for blood in stool and vomiting.  Musculoskeletal: Negative for back pain, myalgias, neck pain and neck stiffness.  Skin: Negative for rash and wound.  Neurological: Positive for tremors. Negative for dizziness, weakness, light-headedness, numbness and headaches.  All other systems reviewed and are negative.    Physical Exam Updated Vital Signs BP 99/84   Pulse 83   Temp (!) 97.5 F (36.4 C) (Oral)   Resp 18   Ht 5' 10.5" (1.791 m)   Wt 122.7 kg   SpO2 99%   BMI 38.26 kg/m   Physical Exam Vitals signs and nursing note reviewed.  Constitutional:       General: He is not in acute distress.    Appearance: Normal appearance. He is well-developed. He is not ill-appearing.  HENT:     Head: Normocephalic and atraumatic.     Nose: Nose normal.     Mouth/Throat:     Mouth: Mucous membranes are dry.  Eyes:     Pupils: Pupils are equal, round, and reactive to light.  Neck:     Musculoskeletal: Normal range of motion and neck supple. No neck rigidity or muscular tenderness.     Vascular: No carotid bruit.  Cardiovascular:     Rate and Rhythm: Normal rate and regular rhythm.     Heart sounds: No murmur. No friction rub. No gallop.   Pulmonary:     Effort: Pulmonary effort is normal. No respiratory distress.     Breath sounds: Normal breath sounds. No stridor. No wheezing, rhonchi or rales.     Comments: Dialysis catheter in left upper chest. Abdominal:     General: Bowel sounds are normal. There is no distension.     Palpations: Abdomen is soft.     Tenderness: There is abdominal tenderness. There is no guarding or rebound.     Comments: Mild epigastric tenderness to palpation.  Musculoskeletal: Normal range of motion.        General: No tenderness, deformity or signs of injury.     Right lower leg: No edema.     Left lower leg: No edema.  Lymphadenopathy:     Cervical: No cervical adenopathy.  Skin:    General: Skin is warm and dry.     Capillary Refill: Capillary refill takes less than 2 seconds.     Findings: No erythema or rash.  Neurological:     General: No focal deficit present.     Mental Status: He is alert and oriented to person, place, and time.  Psychiatric:        Mood and Affect: Mood normal.        Behavior: Behavior normal.      ED Treatments / Results  Labs (all labs ordered are listed, but only abnormal results are displayed) Labs Reviewed  CBC - Abnormal; Notable for the following components:      Result Value   RBC 3.66 (*)    Hemoglobin 11.1 (*)    HCT 34.5 (*)    All other components within  normal limits  URINALYSIS, ROUTINE W REFLEX MICROSCOPIC - Abnormal; Notable for the following components:   Glucose, UA 50 (*)    Hgb urine dipstick SMALL (*)    All other components within normal limits  COMPREHENSIVE METABOLIC PANEL - Abnormal; Notable for the following components:   Sodium 122 (*)    Potassium 5.4 (*)    Chloride 87 (*)    CO2 14 (*)    Glucose, Bld 171 (*)    BUN 86 (*)    Creatinine, Ser 12.97 (*)    Calcium 7.8 (*)    Total Protein 6.4 (*)    Albumin 2.5 (*)    AST 58 (*)    ALT 54 (*)    Alkaline Phosphatase 291 (*)    Total Bilirubin 2.0 (*)    GFR calc non Af Amer 4 (*)    GFR calc Af Amer 4 (*)    All other components within normal limits  TROPONIN I - Abnormal; Notable for the following components:   Troponin I 0.04 (*)    All other components within normal limits  LACTIC ACID, PLASMA - Abnormal; Notable for the following components:   Lactic Acid, Venous 3.0 (*)    All other components within normal limits  CBG MONITORING, ED - Abnormal; Notable for the following components:   Glucose-Capillary 157 (*)    All other components within normal limits  I-STAT CG4 LACTIC ACID, ED - Abnormal; Notable for the following components:   Lactic Acid, Venous 4.42 (*)    All other components within normal limits  CULTURE, BLOOD (ROUTINE X 2)  CULTURE, BLOOD (ROUTINE X 2)  C DIFFICILE QUICK SCREEN W PCR REFLEX  LIPASE, BLOOD    EKG EKG Interpretation  Date/Time:  Friday October 17 2018 09:00:47 EST Ventricular Rate:  77 PR Interval:    QRS Duration: 107 QT Interval:  408 QTC Calculation: 462 R Axis:   104 Text Interpretation:  Sinus rhythm Inferior infarct, old Confirmed by Julianne Rice [phone]) on 10/17/2018 9:22:35 AM   Radiology Ct Abdomen Pelvis Wo Contrast  Result Date: 10/17/2018 CLINICAL DATA:  Hypotension EXAM: CT ABDOMEN AND PELVIS WITHOUT CONTRAST TECHNIQUE: Multidetector CT imaging of the abdomen and pelvis was performed following  the standard protocol without IV contrast. COMPARISON:  11/30/2006 FINDINGS: Lower chest: There is a 5.0 cm round mass at the posterior right lung base. Curvilinear pulmonary opacities extend towards the right hilum. This may simply represent round atelectasis, however malignancy is not excluded. There is an associated small pleural effusion. Left lung base is clear. Hepatobiliary: Diffuse hepatic steatosis. Gallbladder is within normal limits. Pancreas: Unremarkable Spleen: Unremarkable Adrenals/Urinary Tract: Adrenal glands are within normal limits. Chronic changes of the kidneys. No hydronephrosis. Mild diffuse bladder wall thickening. Stomach/Bowel: Stomach is decompressed. There is a segment of wall thickening involving the distal transverse colon. See image 34. The wall is somewhat irregular and measures up to 1.2 cm. There is an adjacent mildly dilated small bowel loops with an air-fluid level. Colon and small bowel are otherwise decompressed. Vascular/Lymphatic: There is no abnormal mesenteric or retroperitoneal adenopathy. Reproductive: Unremarkable prostate. Other: No free fluid. Musculoskeletal: No vertebral compression deformity. IMPRESSION: 5.0 cm mass in the posterior right lung base. Differential diagnosis includes malignancy versus round atelectasis. PET-CT is recommended. There is an associated small right pleural effusion. There is focal wall thickening in the distal transverse colon. This may simply reflect an inflammatory process, however malignancy can have a similar appearance. Colonoscopy is  recommended. Single mildly dilated small bowel loop in the left upper quadrant is nonspecific. It does contain a air-fluid level. Wall thickening of the bladder. Correlate with urinalysis as for the presence of an inflammatory process. Electronically Signed   By: Marybelle Killings M.D.   On: 10/17/2018 12:38   Dg Chest 2 View  Result Date: 10/17/2018 CLINICAL DATA:  Shortness of breath. EXAM: CHEST - 2 VIEW  COMPARISON:  09/18/2017. FINDINGS: Dual-lumen catheter with tip over right atrium. Heart size normal. Mild right base atelectasis/infiltrate with right-sided pleural effusion. These findings are new from prior exam. No pneumothorax. IMPRESSION: 1.  Dual-lumen catheter with tip over right atrium. 2. New onset right base atelectasis/infiltrate right-sided pleural effusion. Electronically Signed   By: Marcello Moores  Register   On: 10/17/2018 10:13    Procedures Procedures (including critical care time)  Medications Ordered in ED Medications  Chlorhexidine Gluconate Cloth 2 % PADS 6 each (has no administration in time range)  sodium chloride 0.9 % bolus 500 mL (0 mLs Intravenous Stopped 10/17/18 1132)  piperacillin-tazobactam (ZOSYN) IVPB 3.375 g (0 g Intravenous Stopped 10/17/18 1132)  vancomycin (VANCOCIN) IVPB 1000 mg/200 mL premix (0 mg Intravenous Stopped 10/17/18 1252)  sodium chloride 0.9 % bolus 1,000 mL (0 mLs Intravenous Stopped 10/17/18 1253)    CRITICAL CARE Performed by: Julianne Rice Total critical care time: 35 minutes Critical care time was exclusive of separately billable procedures and treating other patients. Critical care was necessary to treat or prevent imminent or life-threatening deterioration. Critical care was time spent personally by me on the following activities: development of treatment plan with patient and/or surrogate as well as nursing, discussions with consultants, evaluation of patient's response to treatment, examination of patient, obtaining history from patient or surrogate, ordering and performing treatments and interventions, ordering and review of laboratory studies, ordering and review of radiographic studies, pulse oximetry and re-evaluation of patient's condition. Initial Impression / Assessment and Plan / ED Course  I have reviewed the triage vital signs and the nursing notes.  Pertinent labs & imaging results that were available during my care of the patient  were reviewed by me and considered in my medical decision making (see chart for details).    Patient's blood pressure improved with IV fluids.  Continues to mentate well.  Questionable colitis on CT scan.  Initial lactic acid was elevated.  May be related to dehydration but also sepsis possible.  Blood culture sent and broad-spectrum antibiotics given. Discussed with Dr. Dyann Kief.  Will see patient emergency department and admit.  Also spoke with nephrology who will consult on patient and arrange dialysis.  Final Clinical Impressions(s) / ED Diagnoses   Final diagnoses:  Hypotension, unspecified hypotension type  Elevated lactic acid level  Hyponatremia    ED Discharge Orders    None       Julianne Rice, MD 10/17/18 1413

## 2018-10-18 LAB — RENAL FUNCTION PANEL
Albumin: 2.2 g/dL — ABNORMAL LOW (ref 3.5–5.0)
Anion gap: 15 (ref 5–15)
BUN: 40 mg/dL — AB (ref 6–20)
CO2: 19 mmol/L — ABNORMAL LOW (ref 22–32)
Calcium: 7.5 mg/dL — ABNORMAL LOW (ref 8.9–10.3)
Chloride: 94 mmol/L — ABNORMAL LOW (ref 98–111)
Creatinine, Ser: 8.01 mg/dL — ABNORMAL HIGH (ref 0.61–1.24)
GFR calc Af Amer: 8 mL/min — ABNORMAL LOW (ref >60)
GFR calc non Af Amer: 7 mL/min — ABNORMAL LOW (ref >60)
GLUCOSE: 224 mg/dL — AB (ref 70–99)
Phosphorus: 3.6 mg/dL (ref 2.5–4.6)
Potassium: 3.7 mmol/L (ref 3.5–5.1)
Sodium: 128 mmol/L — ABNORMAL LOW (ref 135–145)

## 2018-10-18 LAB — C DIFFICILE QUICK SCREEN W PCR REFLEX
C DIFFICILE (CDIFF) TOXIN: NEGATIVE
C Diff antigen: NEGATIVE
C Diff interpretation: NOT DETECTED

## 2018-10-18 LAB — GLUCOSE, CAPILLARY
GLUCOSE-CAPILLARY: 275 mg/dL — AB (ref 70–99)
Glucose-Capillary: 216 mg/dL — ABNORMAL HIGH (ref 70–99)
Glucose-Capillary: 185 mg/dL — ABNORMAL HIGH (ref 70–99)
Glucose-Capillary: 199 mg/dL — ABNORMAL HIGH (ref 70–99)

## 2018-10-18 LAB — CORTISOL: Cortisol, Plasma: 6 ug/dL

## 2018-10-18 LAB — HEPATITIS B SURFACE ANTIGEN: HEP B S AG: NEGATIVE

## 2018-10-18 MED ORDER — SACCHAROMYCES BOULARDII 250 MG PO CAPS
250.0000 mg | ORAL_CAPSULE | Freq: Two times a day (BID) | ORAL | Status: DC
  Administered 2018-10-18 – 2018-10-19 (×2): 250 mg via ORAL
  Filled 2018-10-18 (×2): qty 1

## 2018-10-18 MED ORDER — HEPARIN SODIUM (PORCINE) 1000 UNIT/ML IJ SOLN
4000.0000 [IU] | Freq: Once | INTRAMUSCULAR | Status: AC
  Administered 2018-10-18: 4000 [IU]
  Filled 2018-10-18: qty 4

## 2018-10-18 MED ORDER — MIDODRINE HCL 5 MG PO TABS
10.0000 mg | ORAL_TABLET | Freq: Two times a day (BID) | ORAL | Status: DC
  Administered 2018-10-18 – 2018-10-19 (×3): 10 mg via ORAL
  Filled 2018-10-18 (×3): qty 2

## 2018-10-18 NOTE — Progress Notes (Signed)
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 Presented with low blood pressure at dialysis today, reason why he didn't receive it and instead was referred to the emergency department. He states he has been experiencing abdominal cramps and diarrhea which he thinks are from his potassium lowering medications.  He has also had decreased oral intake due to nausea and vomiting.  Patient reported to be compliant with his medications including antihypertensive agents until the day prior to admission despite lack of appropriate oral intake and ongoing GI losses.  Patient denies headaches, lightheadedness, fevers or chills, hemoptysis, hematuria, dysuria, hematochezia, focal weakness or any other complaints.  Assessment & Plan: 1-acute colitis: Patient reports significant improvement in abdominal discomfort and is no longer experiencing nausea or vomiting. -Appetite improving and demanding advance diet -Patient is afebrile remains with normal WBCs. -Continue treatment with Cipro and Flagyl -Advance diet as tolerated -Follow clinical response. -Negative C. Difficile. -Start Florastor twice daily.  2-dehydration/hyponatremia -Improved after fluid resuscitation and dialysis -Patient advised to maintain adequate hydration by mouth -Follow electrolytes trend.  3-end-stage renal disease on hemodialysis: With mild hyperkalemia on admission -Resolved after hemodialysis -Another treatment provided today to get patient back on schedule -Follow electrolytes and further recommendation per renal service. -Now that his potassium is within normal limits we will discontinue telemetry.  4-metabolic  acidosis -Improve after initiation of sodium bicarbonate and dialysis treatment. -Follow bicarb trend.  5-diabetes mellitus -Continue Levemir and sliding scale insulin -Modified carbohydrate diet has been ordered. -Follow CBG and further adjust regimen as needed. -A1c 8.6  6-hypertension -Patient with underlying history of orthostatic hypotension -Lasix has been discontinued -Continue the use of half dose metoprolol -Middaugh drain adjusted to 10 mg twice a day -Follow vital signs. -TSH within normal limits -Cortisol borderline low (6.0 AM Sample)  7-GERD -Continue PPI  8-hyperlipidemia -Continue heart healthy diet -Outpatient follow-up of his lipid profile with initiation of statins if needed.  9-anemia of chronic kidney disease -Hemoglobin stable -There is no signs of bleeding -No need for IV iron or Epogen therapy at this moment as per renal service recommendations.  10-class II obesity -Body mass index is 38.75 kg/m. -Increase physical activity, low calorie diet and portion control-discussed with patient.  11-5.0 cm mass in the posterior right lung base -Differential diagnosis includes malignancy versus round atelectasis. -Patient denies any red flag symptoms -PET-CT has been recommended as an outpatient.  DVT prophylaxis: Heparin Code Status: Full code Family Communication: No family at bedside. Disposition Plan: We will advance diet and if tolerated transition antibiotics to p.o. with anticipated discharge home on 10/19/2018.  Patient is currently afebrile and reports no further nausea vomiting at this time.  Tolerated dialysis well on 10/17/2018 and is having dialysis again in order to get back on schedule (he follows Tuesday-Thursday-Saturday).  Consultants:   Nephrology service  Procedures:   See below for x-ray report.  Inpatient hemodialysis  Antimicrobials:  Anti-infectives (From admission, onward)   Start     Dose/Rate Route Frequency Ordered Stop     10/17/18 2200  metroNIDAZOLE (FLAGYL) IVPB 500 mg     500 mg 100 mL/hr over 60 Minutes Intravenous Every 8 hours 10/17/18 1727  10/17/18 2100  ciprofloxacin (CIPRO) IVPB 400 mg     400 mg 200 mL/hr over 60 Minutes Intravenous Every 24 hours 10/17/18 1727     10/17/18 1030  piperacillin-tazobactam (ZOSYN) IVPB 3.375 g     3.375 g 100 mL/hr over 30 Minutes Intravenous  Once 10/17/18 1029 10/17/18 1132   10/17/18 1030  vancomycin (VANCOCIN) IVPB 1000 mg/200 mL premix     1,000 mg 200 mL/hr over 60 Minutes Intravenous  Once 10/17/18 1029 10/17/18 1252      Subjective: Afebrile, no nausea, no vomiting reported improvement in his loose stools.  Patient tolerated dialysis well.  Orthostatic changes positive this morning.  Objective: Vitals:   10/18/18 1245 10/18/18 1300 10/18/18 1330 10/18/18 1356  BP: (!) 98/43 130/70 122/63 (!) 105/56  Pulse: 90 95 90 92  Resp:    18  Temp:    98 F (36.7 C)  TempSrc:    Oral  SpO2:    96%  Weight:    122.5 kg  Height:        Intake/Output Summary (Last 24 hours) at 10/18/2018 1552 Last data filed at 10/18/2018 1356 Gross per 24 hour  Intake 540 ml  Output 2500 ml  Net -1960 ml   Filed Weights   10/18/18 0625 10/18/18 1056 10/18/18 1356  Weight: 123.5 kg 123.5 kg 122.5 kg    Examination: General exam: Alert, awake, oriented x 3; reports no chest pain, no nausea, no vomiting and no abdominal pain at this time.  Continue having increasing his appetite and express that his stools are forming up.  Patient with positive orthostatic changes this morning. Respiratory system: Clear to auscultation. Respiratory effort normal. Cardiovascular system:RRR. No murmurs, rubs, gallops. Gastrointestinal system: Abdomen is obese, nondistended, soft and nontender. No organomegaly or masses felt. Normal bowel sounds heard. Central nervous system: Alert and oriented. No focal neurological deficits. Extremities: No cyanosis or clubbing; 1-2+ edema  bilaterally. Skin: No rashes, no open wounds, no petechiae; left subclavian hemodialysis catheter in place. Psychiatry: Judgement and insight appear normal. Mood & affect appropriate.     Data Reviewed: I have personally reviewed following labs and imaging studies  CBC: Recent Labs  Lab 10/17/18 0921  WBC 8.5  HGB 11.1*  HCT 34.5*  MCV 94.3  PLT 315   Basic Metabolic Panel: Recent Labs  Lab 10/17/18 0921 10/17/18 1730 10/18/18 0614  NA 122*  --  128*  K 5.4*  --  3.7  CL 87*  --  94*  CO2 14*  --  19*  GLUCOSE 171*  --  224*  BUN 86*  --  40*  CREATININE 12.97*  --  8.01*  CALCIUM 7.8*  --  7.5*  MG  --  1.9  --   PHOS  --  4.3 3.6   GFR: Estimated Creatinine Clearance: 13.5 mL/min (A) (by C-G formula based on SCr of 8.01 mg/dL (H)).   Liver Function Tests: Recent Labs  Lab 10/17/18 0921 10/18/18 0614  AST 58*  --   ALT 54*  --   ALKPHOS 291*  --   BILITOT 2.0*  --   PROT 6.4*  --   ALBUMIN 2.5* 2.2*   Recent Labs  Lab 10/17/18 0921  LIPASE 21   Cardiac Enzymes: Recent Labs  Lab 10/17/18 0921  TROPONINI 0.04*   HbA1C: Recent Labs    10/17/18 1730  HGBA1C 8.6*   CBG: Recent Labs  Lab 10/17/18 0907 10/17/18 1623 10/17/18 2114 10/18/18 0729 10/18/18  [facility]   Thyroid Function Tests: Recent Labs    10/17/18 1730  TSH 2.346   Urine analysis:    Component Value Date/Time   COLORURINE YELLOW 10/17/2018 1320   APPEARANCEUR CLEAR 10/17/2018 1320   LABSPEC 1.008 10/17/2018 1320   PHURINE 5.0 10/17/2018 1320   GLUCOSEU 50 (A) 10/17/2018 1320   HGBUR SMALL (A) 10/17/2018 1320   BILIRUBINUR NEGATIVE 10/17/2018 1320   KETONESUR NEGATIVE 10/17/2018 1320   PROTEINUR NEGATIVE 10/17/2018 1320   NITRITE NEGATIVE 10/17/2018 1320   LEUKOCYTESUR NEGATIVE 10/17/2018 1320    Recent Results (from the past 240 hour(s))  Culture, blood (Routine X 2) w Reflex to ID Panel     Status: None (Preliminary result)    Collection Time: 10/17/18  9:35 AM  Result Value Ref Range Status   Specimen Description BLOOD LEFT HAND  Final   Special Requests   Final    BOTTLES DRAWN AEROBIC AND ANAEROBIC Blood Culture results may not be optimal due to an inadequate volume of blood received in culture bottles   Culture   Final    NO GROWTH < 24 HOURS Performed at [facility], [address] [place], [place] [address]    Report Status PENDING  Incomplete  Culture, blood (Routine X 2) w Reflex to ID Panel     Status: None (Preliminary result)   Collection Time: 10/17/18  9:36 AM  Result Value Ref Range Status   Specimen Description BLOOD LEFT HAND  Final   Special Requests   Final    AEROBIC BOTTLE ONLY Blood Culture results may not be optimal due to an inadequate volume of blood received in culture bottles   Culture   Final    NO GROWTH < 24 HOURS Performed at [facility], [address] [place], [place] [address]    Report Status PENDING  Incomplete  MRSA PCR Screening     Status: None   Collection Time: 10/17/18  5:30 PM  Result Value Ref Range Status   MRSA by PCR NEGATIVE NEGATIVE Final    Comment:        The GeneXpert MRSA Assay (FDA approved for NASAL specimens only), is one component of a comprehensive MRSA colonization surveillance program. It is not intended to diagnose MRSA infection nor to guide or monitor treatment for MRSA infections. Performed at [facility], [address], [place], [place] [address]   C difficile quick scan w PCR reflex     Status: None   Collection Time: 10/17/18 11:00 PM  Result Value Ref Range Status   C Diff antigen NEGATIVE NEGATIVE Final   C Diff toxin NEGATIVE NEGATIVE Final   C Diff interpretation No C. difficile detected.  Final    Comment: Performed at [facility], [address] [place], [place] [address]     Radiology Studies: Ct Abdomen Pelvis Wo Contrast  Result Date: 10/17/2018 CLINICAL DATA:  Hypotension EXAM: CT ABDOMEN AND PELVIS WITHOUT  CONTRAST TECHNIQUE: Multidetector CT imaging of the abdomen and pelvis was performed following the standard protocol without IV contrast. COMPARISON:  11/30/2006 FINDINGS: Lower chest: There is a 5.0 cm round mass at the posterior right lung base. Curvilinear pulmonary opacities extend towards the right hilum. This may simply represent round atelectasis, however malignancy is not excluded. There is an associated small pleural effusion. Left lung base is clear. Hepatobiliary: Diffuse hepatic steatosis. Gallbladder is within normal limits. Pancreas: Unremarkable Spleen: Unremarkable Adrenals/Urinary Tract: Adrenal glands are  within normal limits. Chronic changes of the kidneys. No hydronephrosis. Mild diffuse bladder wall thickening. Stomach/Bowel: Stomach is decompressed. There is a segment of wall thickening involving the distal transverse colon. See image 34. The wall is somewhat irregular and measures up to 1.2 cm. There is an adjacent mildly dilated small bowel loops with an air-fluid level. Colon and small bowel are otherwise decompressed. Vascular/Lymphatic: There is no abnormal mesenteric or retroperitoneal adenopathy. Reproductive: Unremarkable prostate. Other: No free fluid. Musculoskeletal: No vertebral compression deformity. IMPRESSION: 5.0 cm mass in the posterior right lung base. Differential diagnosis includes malignancy versus round atelectasis. PET-CT is recommended. There is an associated small right pleural effusion. There is focal wall thickening in the distal transverse colon. This may simply reflect an inflammatory process, however malignancy can have a similar appearance. Colonoscopy is recommended. Single mildly dilated small bowel loop in the left upper quadrant is nonspecific. It does contain a air-fluid level. Wall thickening of the bladder. Correlate with urinalysis as for the presence of an inflammatory process. Electronically Signed   By: Marybelle Killings M.D.   On: 10/17/2018 12:38   Dg  Chest 2 View  Result Date: 10/17/2018 CLINICAL DATA:  Shortness of breath. EXAM: CHEST - 2 VIEW COMPARISON:  09/18/2017. FINDINGS: Dual-lumen catheter with tip over right atrium. Heart size normal. Mild right base atelectasis/infiltrate with right-sided pleural effusion. These findings are new from prior exam. No pneumothorax. IMPRESSION: 1.  Dual-lumen catheter with tip over right atrium. 2. New onset right base atelectasis/infiltrate right-sided pleural effusion. Electronically Signed   By: Ambler   On: 10/17/2018 10:13    Scheduled Meds: . amitriptyline  25 mg Oral QHS  . aspirin EC  81 mg Oral Daily  . calcium acetate  1,334 mg Oral TID WC  . Chlorhexidine Gluconate Cloth  6 each Topical Q0600  . feeding supplement (NEPRO CARB STEADY)  237 mL Oral BID BM  . gabapentin  300 mg Oral Daily  . heparin  5,000 Units Subcutaneous Q8H  . insulin aspart  0-9 Units Subcutaneous TID WC  . insulin detemir  7 Units Subcutaneous QHS  . metoprolol tartrate  12.5 mg Oral BID  . midodrine  10 mg Oral BID WC  . mirtazapine  15 mg Oral QHS  . mometasone-formoterol  2 puff Inhalation BID  . multivitamin  1 tablet Oral QHS  . pantoprazole  40 mg Oral Daily  . saccharomyces boulardii  250 mg Oral BID  . sodium bicarbonate  650 mg Oral BID  . sodium chloride flush  3 mL Intravenous Q12H   Continuous Infusions: . sodium chloride    . ciprofloxacin 400 mg (10/17/18 2054)  . metronidazole 500 mg (10/18/18 1518)     LOS: 1 day    Time spent: 30 minutes   Barton Dubois, MD [facility] Pager [phone]  If 7PM-7AM, please contact night-coverage [email or web address] Password [facility] 10/18/2018, 3:52 PM

## 2018-10-18 NOTE — Progress Notes (Signed)
[facility] Progress Note    Assessment/ Plan:   58 y.o.malewith hypertension, diabetes mellitus type 2 insulin dependent, hyperlipidemia, GERD, and morbid obesity  [facility]/125kg (but has left as low as 122kg when HD in [place] recently).  1. ESRD on HD TTS.  - Tolerated HD treatments Fri and Sat with no cramping. - Next HD Tues if he's still in the hospital - EDW is 122.5 kg on a floor scale.  2. Hyponatremia. Serum sodium currently 128. Suspect that most of his hyponatremia is secondary to fluid indiscretions + alcohol.  3. Anemia of chronic kidney disease. Hemoglobin currently 11.1 @ goal. No ESA's needed at this time.  4. Secondary hyperparathyroidism. Recheck phosphorus (3.6); cont binder for now, no changes needed.  5. 5.0 cm mass in the posterior right lung base. Differential diagnosis includes malignancy versus round atelectasis. PET-CT is recommended.  6. ?Infiltrate on CXR7. Orthostasis  Subjective:   Feels better and denies dizziness or vertigo; denies f/c/n/v/dyspnea. No events overnight   Objective:   BP (!) 105/56 (BP Location: Left Arm)   Pulse 92   Temp 98 F (36.7 C) (Oral)   Resp 18   Ht 5\' 10"  (1.778 m)   Wt 122.5 kg   SpO2 96%   BMI 38.75 kg/m   Intake/Output Summary (Last 24 hours) at 10/18/2018 1556 Last data filed at 10/18/2018 1356 Gross per 24 hour  Intake 540 ml  Output 2500 ml  Net -1960 ml   Weight change:   Physical Exam: General appearance: NAD A&Ox3 Head: NCAT Chest wall: no tenderness Cardio: regular rate and rhythm, S1, S2 normal, no murmur, click, rub or gallop GI: soft, non-tender; bowel sounds normal; no masses,  no organomegaly Extremities: edema tr-1+ Pulses: 2+ and symmetric   Imaging: Ct Abdomen Pelvis Wo Contrast  Result Date: 10/17/2018 CLINICAL DATA:  Hypotension EXAM: CT ABDOMEN AND PELVIS WITHOUT CONTRAST TECHNIQUE: Multidetector CT imaging of the abdomen and pelvis  was performed following the standard protocol without IV contrast. COMPARISON:  11/30/2006 FINDINGS: Lower chest: There is a 5.0 cm round mass at the posterior right lung base. Curvilinear pulmonary opacities extend towards the right hilum. This may simply represent round atelectasis, however malignancy is not excluded. There is an associated small pleural effusion. Left lung base is clear. Hepatobiliary: Diffuse hepatic steatosis. Gallbladder is within normal limits. Pancreas: Unremarkable Spleen: Unremarkable Adrenals/Urinary Tract: Adrenal glands are within normal limits. Chronic changes of the kidneys. No hydronephrosis. Mild diffuse bladder wall thickening. Stomach/Bowel: Stomach is decompressed. There is a segment of wall thickening involving the distal transverse colon. See image 34. The wall is somewhat irregular and measures up to 1.2 cm. There is an adjacent mildly dilated small bowel loops with an air-fluid level. Colon and small bowel are otherwise decompressed. Vascular/Lymphatic: There is no abnormal mesenteric or retroperitoneal adenopathy. Reproductive: Unremarkable prostate. Other: No free fluid. Musculoskeletal: No vertebral compression deformity. IMPRESSION: 5.0 cm mass in the posterior right lung base. Differential diagnosis includes malignancy versus round atelectasis. PET-CT is recommended. There is an associated small right pleural effusion. There is focal wall thickening in the distal transverse colon. This may simply reflect an inflammatory process, however malignancy can have a similar appearance. Colonoscopy is recommended. Single mildly dilated small bowel loop in the left upper quadrant is nonspecific. It does contain a air-fluid level. Wall thickening of the bladder. Correlate with urinalysis as for the presence of an inflammatory process. Electronically Signed   By: Marybelle Killings M.D.   On:  10/17/2018 12:38   Dg Chest 2 View  Result Date: 10/17/2018 CLINICAL DATA:  Shortness of  breath. EXAM: CHEST - 2 VIEW COMPARISON:  09/18/2017. FINDINGS: Dual-lumen catheter with tip over right atrium. Heart size normal. Mild right base atelectasis/infiltrate with right-sided pleural effusion. These findings are new from prior exam. No pneumothorax. IMPRESSION: 1.  Dual-lumen catheter with tip over right atrium. 2. New onset right base atelectasis/infiltrate right-sided pleural effusion. Electronically Signed   By: Marcello Moores  Register   On: 10/17/2018 10:13    Labs: BMET Recent Labs  Lab 10/17/18 7017 10/17/18 1730 10/18/18 0614  NA 122*  --  128*  K 5.4*  --  3.7  CL 87*  --  94*  CO2 14*  --  19*  GLUCOSE 171*  --  224*  BUN 86*  --  40*  CREATININE 12.97*  --  8.01*  CALCIUM 7.8*  --  7.5*  PHOS  --  4.3 3.6   CBC Recent Labs  Lab 10/17/18 0921  WBC 8.5  HGB 11.1*  HCT 34.5*  MCV 94.3  PLT 152    Medications:    . amitriptyline  25 mg Oral QHS  . aspirin EC  81 mg Oral Daily  . calcium acetate  1,334 mg Oral TID WC  . Chlorhexidine Gluconate Cloth  6 each Topical Q0600  . feeding supplement (NEPRO CARB STEADY)  237 mL Oral BID BM  . gabapentin  300 mg Oral Daily  . heparin  5,000 Units Subcutaneous Q8H  . insulin aspart  0-9 Units Subcutaneous TID WC  . insulin detemir  7 Units Subcutaneous QHS  . metoprolol tartrate  12.5 mg Oral BID  . midodrine  10 mg Oral BID WC  . mirtazapine  15 mg Oral QHS  . mometasone-formoterol  2 puff Inhalation BID  . multivitamin  1 tablet Oral QHS  . pantoprazole  40 mg Oral Daily  . saccharomyces boulardii  250 mg Oral BID  . sodium bicarbonate  650 mg Oral BID  . sodium chloride flush  3 mL Intravenous Q12H      Otelia Santee, MD 10/18/2018, 3:56 PM

## 2018-10-19 DIAGNOSIS — I959 Hypotension, unspecified: Secondary | ICD-10-CM

## 2018-10-19 LAB — GLUCOSE, CAPILLARY
GLUCOSE-CAPILLARY: 146 mg/dL — AB (ref 70–99)
Glucose-Capillary: 210 mg/dL — ABNORMAL HIGH (ref 70–99)

## 2018-10-19 MED ORDER — METOPROLOL TARTRATE 25 MG PO TABS: 12.5000 mg | ORAL_TABLET | Freq: Two times a day (BID) | ORAL | 1 refills | Status: DC

## 2018-10-19 MED ORDER — GABAPENTIN 300 MG PO CAPS: 300.0000 mg | ORAL_CAPSULE | Freq: Every day | ORAL | Status: DC

## 2018-10-19 MED ORDER — METRONIDAZOLE 500 MG PO TABS: 500.0000 mg | ORAL_TABLET | Freq: Three times a day (TID) | ORAL | 0 refills | Status: AC

## 2018-10-19 MED ORDER — CIPROFLOXACIN HCL 500 MG PO TABS: 500.0000 mg | ORAL_TABLET | Freq: Every day | ORAL | 0 refills | Status: AC

## 2018-10-19 MED ORDER — SODIUM BICARBONATE 650 MG PO TABS: 650.0000 mg | ORAL_TABLET | Freq: Two times a day (BID) | ORAL | 1 refills | Status: DC

## 2018-10-19 MED ORDER — OMEPRAZOLE MAGNESIUM 20 MG PO TBEC: 20.0000 mg | DELAYED_RELEASE_TABLET | Freq: Every day | ORAL | 1 refills | Status: DC

## 2018-10-19 MED ORDER — MIDODRINE HCL 10 MG PO TABS: 10.0000 mg | ORAL_TABLET | Freq: Two times a day (BID) | ORAL | 1 refills | Status: DC

## 2018-10-19 MED ORDER — METRONIDAZOLE 500 MG PO TABS
500.0000 mg | ORAL_TABLET | Freq: Three times a day (TID) | ORAL | Status: DC
  Administered 2018-10-19: 500 mg via ORAL
  Filled 2018-10-19: qty 1

## 2018-10-19 MED ORDER — SACCHAROMYCES BOULARDII 250 MG PO CAPS: 250.0000 mg | ORAL_CAPSULE | Freq: Two times a day (BID) | ORAL | 0 refills | Status: DC

## 2018-10-19 MED ORDER — CIPROFLOXACIN HCL 250 MG PO TABS
500.0000 mg | ORAL_TABLET | Freq: Every day | ORAL | Status: DC
  Administered 2018-10-19: 500 mg via ORAL
  Filled 2018-10-19: qty 2

## 2018-10-19 NOTE — Discharge Summary (Signed)
Physician Discharge Summary  Steven [name] QIO:[id number] DOB: Nov 24, 1960 DOA: 10/17/2018  PCP: Steven Burly, MD  Admit date: 10/17/2018 Discharge date: 10/19/2018  Time spent: 35 minutes  Recommendations for Outpatient Follow-up:  1. Repeat BMET to follow electrolytes  2. Reassess BP and adjust medications as needed 3. Patient will need follow up with GI service for colonoscopy in 4 weeks (follow up of acute colitis and for screening purposes). 4. Repeat PET-CT chest in 3-4 weeks to evaluate abnormal findings.  5. Repeat CBC to follow hemoglobin trend.   Discharge Diagnoses:  Principal Problem:   Colitis Active Problems:   Diabetes mellitus type 2 in obese (HCC)   Hyperlipidemia   Morbid obesity (HCC)   GERD   End stage renal disease (HCC)   Hyponatremia   Elevated lactic acid level   Hypotension abnormal lung CT  Discharge Condition: Stable and improved.  Patient discharged home with instruction to follow-up with PCP in 10 days.  Diet recommendation: Low calorie diet, modified carbohydrates and low sodium.  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 Presented with low blood pressure at dialysis today, reason why he didn't receive it and instead was referred to the emergency department. He states he has beenexperiencingabdominal cramps and diarrhea which he thinks are from his potassium lowering medications.  He has also had decreased oral intake due to nauseaand vomiting.  Patient reported to be compliant with his medications including antihypertensive agents until the day prior to admission despite lack of appropriate oral intake and ongoing GI  losses.  Patient denies headaches, lightheadedness, fevers or chills, hemoptysis, hematuria,dysuria,hematochezia, focal weakness or any other complaints.   Hospital Course:  1-acute colitis: Patient reports significant improvement in abdominal discomfort and is no longer experiencing nausea or vomiting. -Patient is afebrile and his WBCs remained in normal limits. -Continue treatment with Cipro and Flagyl -Tolerating diet without any problems. -Follow up with GI service as an outpatient for colonoscopy in approximately 4 weeks to reassess complete resolution of colitis and for screening purposes. -Negative C. Difficile. -Started on Florastor twice daily while receiving antibiotic therapy..  2-dehydration/hyponatremia -Improved after fluid resuscitation and dialysis -Patient advised to maintain adequate hydration by mouth -Follow electrolytes trend.  3-end-stage renal disease on hemodialysis: With mild hyperkalemia on admission -Resolved after hemodialysis -Another treatment provided on 10/18/2018 to get patient back on regular outpatient HD schedule. -At discharge potassium was within normal limits.  No need of further lower potassium medication at discharge. -Continue outpatient follow-up with nephrology service.  4-metabolic acidosis -Improve after initiation of sodium bicarbonate and dialysis treatment. -Follow bicarb trend. -Continue sodium bicarb 650 mg twice a day at discharge.  5-diabetes mellitus -Continue Levemir and sliding scale insulin -Modified carbohydrate diet has been ordered. -Follow CBG and further adjust regimen as needed. -A1c 8.6  6-hypertension -Patient with underlying history of orthostatic hypotension -Lasix has been discontinued -Continue the use of half dose metoprolol -Midodrine adjusted to 10 mg twice a day -Follow vital signs at follow up visit. -TSH within normal limits -Cortisol borderline low (6.0 AM Sample)  7-GERD -Continue  PPI  8-hyperlipidemia -Continue heart healthy diet -Outpatient follow-up of his lipid profile with initiation of statins if needed.  9-anemia of chronic  kidney disease -Hemoglobin stable -There is no signs of bleeding -No need for IV iron or Epogen therapy at this moment as per renal service recommendations. -Repeat CBC at follow-up visit to reassess hemoglobin trend.  10-class II obesity -Body mass index is 38.75 kg/m. -Increase physical activity, low calorie diet and portion control-discussed with patient.  11-5.0 cm mass in the posterior right lung base -Differential diagnosis includes malignancy versus round atelectasis. -Patient denies any red flag symptoms -PET-CT has been recommended as an outpatient.  Procedures:  See below for x-ray reports  Inpatient hemodialysis treatment provided.  Consultations:  Nephrology service  Discharge Exam: Vitals:   10/19/18 0711 10/19/18 0756  BP: 115/66   Pulse: 89   Resp: 18   Temp: 98.2 F (36.8 C)   SpO2: 96% 95%   General exam: Alert, awake, oriented x 3; reports no chest pain, no nausea, no vomiting and no abdominal pain at this time.  Continue having good appetite and express that his stools continue forming up.  Patient is no longer orthostatic, stable blood pressure at discharge.   Respiratory system: Clear to auscultation. Respiratory effort normal. Cardiovascular system:RRR. No murmurs, rubs, gallops. Gastrointestinal system: Abdomen is obese, nondistended, soft and nontender. No organomegaly or masses felt. Normal bowel sounds heard. Central nervous system: Alert and oriented. No focal neurological deficits. Extremities: No cyanosis or clubbing; 1+ edema bilaterally. Skin: No rashes, no open wounds, no petechiae; left subclavian hemodialysis catheter in place. Psychiatry: Judgement and insight appear normal. Mood & affect appropriate.    Discharge Instructions   Discharge Instructions    Diet - low sodium  heart healthy   Complete by:  As directed    Discharge instructions   Complete by:  As directed    Arrange outpatient follow-up with PCP in 10 days Keep yourself well-hydrated Stop the use of smokeless tobacco Take medications as prescribed Follow renal/modified carbohydrate and low calorie diet Next dialysis treatment on October 21, 2018.     Allergies as of 10/19/2018      Reactions   Codeine Nausea And Vomiting      Medication List    STOP taking these medications   furosemide 80 MG tablet Commonly known as:  LASIX     TAKE these medications   accu-chek multiclix lancets Use as instructed.  check blood sugar up to 3 times a day Disp:  1 box   amitriptyline 25 MG tablet Commonly known as:  ELAVIL Take 25 mg by mouth at bedtime.   aspirin EC 81 MG tablet TAKE ONE  BY MOUTH EVERY DAY   calcium acetate 667 MG capsule Commonly known as:  PHOSLO Take 2 capsules (1,334 mg total) by mouth 3 (three) times daily with meals.   ciprofloxacin 500 MG tablet Commonly known as:  CIPRO Take 1 tablet (500 mg total) by mouth daily for 10 days. Start taking on:  October 20, 2018   feeding supplement (NEPRO CARB STEADY) Liqd Take 237 mLs by mouth 2 (two) times daily between meals.   gabapentin 300 MG capsule Commonly known as:  NEURONTIN Take 1 capsule (300 mg total) by mouth daily.   glipiZIDE 5 MG tablet Commonly known as:  GLUCOTROL 5 mg daily before breakfast.   glucose blood test strip Commonly known as:  ACCU-CHEK AVIVA Use as instructed.use to check blood sugar up to 3 times a day  Disp: 1 box   insulin aspart 100 UNIT/ML injection Commonly known as:  novoLOG Inject 12 Units into the  skin 3 (three) times daily with meals.   insulin detemir 100 UNIT/ML injection Commonly known as:  LEVEMIR Inject 0.25 mLs (25 Units total) into the skin 2 (two) times daily.   Insulin Pen Needle 31G X 8 MM Misc 1 Container by Does not apply route QID. qid--any brand   Insulin  Syringes (Disposable) U-100 1 ML Misc Use as directed with insulin   metoprolol tartrate 25 MG tablet Commonly known as:  LOPRESSOR Take 0.5 tablets (12.5 mg total) by mouth 2 (two) times daily. What changed:  how much to take   metroNIDAZOLE 500 MG tablet Commonly known as:  FLAGYL Take 1 tablet (500 mg total) by mouth every 8 (eight) hours for 10 days.   midodrine 10 MG tablet Commonly known as:  PROAMATINE Take 1 tablet (10 mg total) by mouth 2 (two) times daily with a meal. What changed:    medication strength  how much to take  when to take this  reasons to take this   mirtazapine 15 MG tablet Commonly known as:  REMERON TK 1 T PO QD ATN   multivitamin Tabs tablet Take 1 tablet by mouth at bedtime.   omeprazole 20 MG tablet Commonly known as:  PRILOSEC OTC Take 1 tablet (20 mg total) by mouth daily.   PROAIR HFA 108 (90 Base) MCG/ACT inhaler Generic drug:  albuterol Inhale 2 puffs into the lungs every 4 (four) hours as needed for wheezing or shortness of breath.   saccharomyces boulardii 250 MG capsule Commonly known as:  FLORASTOR Take 1 capsule (250 mg total) by mouth 2 (two) times daily.   sodium bicarbonate 650 MG tablet Take 1 tablet (650 mg total) by mouth 2 (two) times daily.   SYMBICORT 80-4.5 MCG/ACT inhaler Generic drug:  budesonide-formoterol Inhale 2 puffs into the lungs daily.      Allergies  Allergen Reactions  . Codeine Nausea And Vomiting   Follow-up Information    Steven Burly, MD. Schedule an appointment as soon as possible for a visit in 10 day(s).   Specialty:  Internal Medicine Contact information: [address] [place] [phone]            The results of significant diagnostics from this hospitalization (including imaging, microbiology, ancillary and laboratory) are listed below for reference.    Significant Diagnostic Studies: Ct Abdomen Pelvis Wo Contrast  Result Date: 10/17/2018 CLINICAL  DATA:  Hypotension EXAM: CT ABDOMEN AND PELVIS WITHOUT CONTRAST TECHNIQUE: Multidetector CT imaging of the abdomen and pelvis was performed following the standard protocol without IV contrast. COMPARISON:  11/30/2006 FINDINGS: Lower chest: There is a 5.0 cm round mass at the posterior right lung base. Curvilinear pulmonary opacities extend towards the right hilum. This may simply represent round atelectasis, however malignancy is not excluded. There is an associated small pleural effusion. Left lung base is clear. Hepatobiliary: Diffuse hepatic steatosis. Gallbladder is within normal limits. Pancreas: Unremarkable Spleen: Unremarkable Adrenals/Urinary Tract: Adrenal glands are within normal limits. Chronic changes of the kidneys. No hydronephrosis. Mild diffuse bladder wall thickening. Stomach/Bowel: Stomach is decompressed. There is a segment of wall thickening involving the distal transverse colon. See image 34. The wall is somewhat irregular and measures up to 1.2 cm. There is an adjacent mildly dilated small bowel loops with an air-fluid level. Colon and small bowel are otherwise decompressed. Vascular/Lymphatic: There is no abnormal mesenteric or retroperitoneal adenopathy. Reproductive: Unremarkable prostate. Other: No free fluid. Musculoskeletal: No vertebral compression deformity. IMPRESSION: 5.0  cm mass in the posterior right lung base. Differential diagnosis includes malignancy versus round atelectasis. PET-CT is recommended. There is an associated small right pleural effusion. There is focal wall thickening in the distal transverse colon. This may simply reflect an inflammatory process, however malignancy can have a similar appearance. Colonoscopy is recommended. Single mildly dilated small bowel loop in the left upper quadrant is nonspecific. It does contain a air-fluid level. Wall thickening of the bladder. Correlate with urinalysis as for the presence of an inflammatory process. Electronically Signed    By: Marybelle Killings M.D.   On: 10/17/2018 12:38   Dg Chest 2 View  Result Date: 10/17/2018 CLINICAL DATA:  Shortness of breath. EXAM: CHEST - 2 VIEW COMPARISON:  09/18/2017. FINDINGS: Dual-lumen catheter with tip over right atrium. Heart size normal. Mild right base atelectasis/infiltrate with right-sided pleural effusion. These findings are new from prior exam. No pneumothorax. IMPRESSION: 1.  Dual-lumen catheter with tip over right atrium. 2. New onset right base atelectasis/infiltrate right-sided pleural effusion. Electronically Signed   By: Marcello Moores  Register   On: 10/17/2018 10:13    Microbiology: Recent Results (from the past 240 hour(s))  Culture, blood (Routine X 2) w Reflex to ID Panel     Status: None (Preliminary result)   Collection Time: 10/17/18  9:35 AM  Result Value Ref Range Status   Specimen Description BLOOD LEFT HAND  Final   Special Requests   Final    BOTTLES DRAWN AEROBIC AND ANAEROBIC Blood Culture results may not be optimal due to an inadequate volume of blood received in culture bottles   Culture   Final    NO GROWTH 2 DAYS Performed at [facility], [address], [place], [place] [address]    Report Status PENDING  Incomplete  Culture, blood (Routine X 2) w Reflex to ID Panel     Status: None (Preliminary result)   Collection Time: 10/17/18  9:36 AM  Result Value Ref Range Status   Specimen Description BLOOD LEFT HAND  Final   Special Requests   Final    AEROBIC BOTTLE ONLY Blood Culture results may not be optimal due to an inadequate volume of blood received in culture bottles   Culture   Final    NO GROWTH 2 DAYS Performed at [facility], [address], [place], [place] [address]    Report Status PENDING  Incomplete  MRSA PCR Screening     Status: None   Collection Time: 10/17/18  5:30 PM  Result Value Ref Range Status   MRSA by PCR NEGATIVE NEGATIVE Final    Comment:        The GeneXpert MRSA Assay (FDA approved for NASAL specimens only), is one  component of a comprehensive MRSA colonization surveillance program. It is not intended to diagnose MRSA infection nor to guide or monitor treatment for MRSA infections. Performed at [facility], [address], [place], [place] [address]   C difficile quick scan w PCR reflex     Status: None   Collection Time: 10/17/18 11:00 PM  Result Value Ref Range Status   C Diff antigen NEGATIVE NEGATIVE Final   C Diff toxin NEGATIVE NEGATIVE Final   C Diff interpretation No C. difficile detected.  Final    Comment: Performed at [facility], [address], [place], [place] [address]     Labs: Basic Metabolic Panel: Recent Labs  Lab 10/17/18 0921 10/17/18 1730 10/18/18 0614  NA 122*  --  128*  K 5.4*  --  3.7  CL 87*  --  94*  CO2 14*  --  19*  GLUCOSE 171*  --  224*  BUN 86*  --  40*  CREATININE 12.97*  --  8.01*  CALCIUM 7.8*  --  7.5*  MG  --  1.9  --   PHOS  --  4.3 3.6   Liver Function Tests: Recent Labs  Lab 10/17/18 0921 10/18/18 0614  AST 58*  --   ALT 54*  --   ALKPHOS 291*  --   BILITOT 2.0*  --   PROT 6.4*  --   ALBUMIN 2.5* 2.2*   Recent Labs  Lab 10/17/18 0921  LIPASE 21   CBC: Recent Labs  Lab 10/17/18 0921  WBC 8.5  HGB 11.1*  HCT 34.5*  MCV 94.3  PLT 152   Cardiac Enzymes: Recent Labs  Lab 10/17/18 0921  TROPONINI 0.04*   CBG: Recent Labs  Lab 10/18/18 1133 10/18/18 1612 10/18/18 2135 10/19/18 0754 10/19/18 1109  GLUCAP 185* 199* 275* 146* 210*    Signed:  Barton Dubois MD.  [facility] 10/19/2018, 11:58 AM

## 2018-10-20 LAB — HEPATITIS B E ANTIBODY: Hep B E Ab: NEGATIVE

## 2018-10-21 DIAGNOSIS — E119 Type 2 diabetes mellitus without complications: Secondary | ICD-10-CM | POA: Diagnosis not present

## 2018-10-21 DIAGNOSIS — D509 Iron deficiency anemia, unspecified: Secondary | ICD-10-CM | POA: Diagnosis not present

## 2018-10-21 DIAGNOSIS — N2581 Secondary hyperparathyroidism of renal origin: Secondary | ICD-10-CM | POA: Diagnosis not present

## 2018-10-21 DIAGNOSIS — Z992 Dependence on renal dialysis: Secondary | ICD-10-CM | POA: Diagnosis not present

## 2018-10-21 DIAGNOSIS — N186 End stage renal disease: Secondary | ICD-10-CM | POA: Diagnosis not present

## 2018-10-21 DIAGNOSIS — Z794 Long term (current) use of insulin: Secondary | ICD-10-CM | POA: Diagnosis not present

## 2018-10-21 DIAGNOSIS — D631 Anemia in chronic kidney disease: Secondary | ICD-10-CM | POA: Diagnosis not present

## 2018-10-22 LAB — CULTURE, BLOOD (ROUTINE X 2)
Culture: NO GROWTH
Culture: NO GROWTH

## 2018-10-23 ENCOUNTER — Ambulatory Visit (INDEPENDENT_AMBULATORY_CARE_PROVIDER_SITE_OTHER): Payer: Medicare Other | Admitting: Nurse Practitioner

## 2018-10-23 ENCOUNTER — Encounter (INDEPENDENT_AMBULATORY_CARE_PROVIDER_SITE_OTHER): Payer: Medicare Other

## 2018-10-23 ENCOUNTER — Encounter (INDEPENDENT_AMBULATORY_CARE_PROVIDER_SITE_OTHER): Payer: Self-pay | Admitting: Nurse Practitioner

## 2018-10-23 VITALS — BP 111/72 | HR 124 | Resp 20 | Ht 70.5 in | Wt 277.4 lb

## 2018-10-23 DIAGNOSIS — E785 Hyperlipidemia, unspecified: Secondary | ICD-10-CM

## 2018-10-23 DIAGNOSIS — T82898A Other specified complication of vascular prosthetic devices, implants and grafts, initial encounter: Secondary | ICD-10-CM | POA: Diagnosis not present

## 2018-10-23 DIAGNOSIS — I10 Essential (primary) hypertension: Secondary | ICD-10-CM

## 2018-10-23 DIAGNOSIS — T8090XS Unspecified complication following infusion and therapeutic injection, sequela: Secondary | ICD-10-CM

## 2018-10-23 DIAGNOSIS — Z992 Dependence on renal dialysis: Secondary | ICD-10-CM | POA: Diagnosis not present

## 2018-10-23 NOTE — Progress Notes (Signed)
Subjective:    Patient ID: Steven [name], male    DOB: 1961/02/15, 58 y.o.   MRN: [id number] Chief Complaint  Patient presents with  . Follow-up    HPI  Steven [name] is a 58 y.o. male that presents today with concerns for his hemodialysis access.  He states that he believes that it is clotted.  He has not been able to utilize it for hemodialysis, he has needed to use a PermCath on the left chest.  He denies any fever, chills, nausea, vomiting or diarrhea.  Denies any chest pain or shortness of breath.   The patient denies hand pain or other symptoms consistent with steal phenomena.  No significant arm swelling.  The patient denies redness or swelling at the access site. The patient denies fever or chills at home or while on dialysis.  The patient denies amaurosis fugax or recent TIA symptoms. There are no recent neurological changes noted. The patient denies claudication symptoms or rest pain symptoms. The patient denies history of DVT, PE or superficial thrombophlebitis. The patient denies recent episodes of angina or shortness of breath.      Past Medical History:  Diagnosis Date  . Anemia   . Cellulitis and abscess of right lower extremity 03/01/2017  . Chronic kidney disease    dialysis m,w,f  . Diabetes mellitus without complication ([place])   . Edema extremities 03/01/2017   bilateral swelling  . GERD (gastroesophageal reflux disease)   . High cholesterol   . High triglycerides   . Hypertension   . Neuropathy   . Sleep apnea    can't use cpap d/t feelings of suffocation    Past Surgical History:  Procedure Laterality Date  . A/V FISTULAGRAM Right 08/27/2017   Procedure: A/V FISTULAGRAM;  Surgeon: Katha Cabal, MD;  Location: [facility];  Service: Cardiovascular;  Laterality: Right;  . A/V SHUNT INTERVENTION N/A 06/23/2018   Procedure: A/V SHUNT INTERVENTION;  Surgeon: Algernon Huxley, MD;  Location: [facility];  Service: Cardiovascular;   Laterality: N/A;  . APPENDECTOMY  2010  . AV FISTULA PLACEMENT Right 06/14/2017   Procedure: ARTERIOVENOUS (AV) FISTULA CREATION WRIST;  Surgeon: Katha Cabal, MD;  Location: [facility];  Service: Vascular;  Laterality: Right;  . DIALYSIS/PERMA CATHETER INSERTION N/A 03/05/2017   Procedure: Dialysis/Perma Catheter Insertion;  Surgeon: Katha Cabal, MD;  Location: [place] [facility];  Service: Cardiovascular;  Laterality: N/A;  . DIALYSIS/PERMA CATHETER INSERTION N/A 09/20/2017   Procedure: DIALYSIS/PERMA CATHETER INSERTION;  Surgeon: Katha Cabal, MD;  Location: [place] [facility];  Service: Cardiovascular;  Laterality: N/A;  . EXCHANGE OF A DIALYSIS CATHETER  03/29/2017   Procedure: Exchange Of A Dialysis Catheter;  Surgeon: Katha Cabal, MD;  Location: [place];  Service: Cardiovascular;;  . IRRIGATION AND DEBRIDEMENT FOOT Right 03/01/2017   Procedure: IRRIGATION AND DEBRIDEMENT FOOT;  Surgeon: Albertine Patricia, DPM;  Location: [facility];  Service: Podiatry;  Laterality: Right;  application of wound vac  . PERIPHERAL VASCULAR THROMBECTOMY Right 06/18/2018   Procedure: PERIPHERAL VASCULAR THROMBECTOMY;  Surgeon: Algernon Huxley, MD;  Location: [place] [facility];  Service: Cardiovascular;  Laterality: Right;    Social History   Socioeconomic History  . Marital status: Divorced    Spouse name: Not on file  . Number of children: Not on file  . Years of education: Not on file  . Highest education level: Not on file  Occupational History  .  Not on file  Social Needs  . Financial resource strain: Not on file  . Food insecurity:    Worry: Not on file    Inability: Not on file  . Transportation needs:    Medical: Not on file    Non-medical: Not on file  Tobacco Use  . Smoking status: Never Smoker  . Smokeless tobacco: Current User    Types: Snuff  Substance and Sexual Activity  . Alcohol use: Yes    Comment: up until 01/2017 was heavy drinker...4 40 oz  qd  . Drug use: No  . Sexual activity: Never  Lifestyle  . Physical activity:    Days per week: Not on file    Minutes per session: Not on file  . Stress: Not on file  Relationships  . Social connections:    Talks on phone: Not on file    Gets together: Not on file    Attends religious service: Not on file    Active member of club or organization: Not on file    Attends meetings of clubs or organizations: Not on file    Relationship status: Not on file  . Intimate partner violence:    Fear of current or ex partner: Not on file    Emotionally abused: Not on file    Physically abused: Not on file    Forced sexual activity: Not on file  Other Topics Concern  . Not on file  Social History Narrative  . Not on file    Family History  Problem Relation Age of Onset  . CAD Father   . Heart disease Father     Allergies  Allergen Reactions  . Codeine Nausea And Vomiting     Review of Systems   Review of Systems: Negative Unless Checked Constitutional: [] Weight loss  [] Fever  [] Chills Cardiac: [] Chest pain   []  Atrial Fibrillation  [] Palpitations   [] Shortness of breath when laying flat   [] Shortness of breath with exertion. [] Shortness of breath at rest Vascular:  [] Pain in legs with walking   [] Pain in legs with standing [] Pain in legs when laying flat   [] Claudication    [] Pain in feet when laying flat    [] History of DVT   [] Phlebitis   [x] Swelling in legs   [] Varicose veins   [] Non-healing ulcers Pulmonary:   [] Uses home oxygen   [] Productive cough   [] Hemoptysis   [] Wheeze  [] COPD   [] Asthma Neurologic:  [] Dizziness   [] Seizures  [] Blackouts [] History of stroke   [] History of TIA  [] Aphasia   [] Temporary Blindness   [] Weakness or numbness in arm   [] Weakness or numbness in leg Musculoskeletal:   [] Joint swelling   [] Joint pain   [] Low back pain  []  History of Knee Replacement [] Arthritis [] back Surgeries  []  Spinal Stenosis    Hematologic:  [] Easy bruising  [] Easy bleeding    [] Hypercoagulable state   [x] Anemic Gastrointestinal:  [] Diarrhea   [] Vomiting  [] Gastroesophageal reflux/heartburn   [] Difficulty swallowing. [] Abdominal pain Genitourinary:  [x] Chronic kidney disease   [] Difficult urination  [] Anuric   [] Blood in urine [] Frequent urination  [] Burning with urination   [] Hematuria Skin:  [] Rashes   [] Ulcers [] Wounds Psychological:  [] History of anxiety   []  History of major depression  []  Memory Difficulties     Objective:   Physical Exam  BP 111/72 (BP Location: Left Arm, Patient Position: Sitting)   Pulse (!) 124   Resp 20   Ht 5' 10.5" (1.791 m)  Wt 277 lb 6.4 oz (125.8 kg)   BMI 39.24 kg/m   Gen: WD/WN, NAD Head: [place]/AT, No temporalis wasting.  Ear/Nose/Throat: Hearing grossly intact, nares w/o erythema or drainage Eyes: PER, EOMI, sclera nonicteric.  Neck: Supple, no masses.  No JVD.  Pulmonary:  Good air movement, no use of accessory muscles.  Cardiac: RRR Vascular: No thrill and bruit  Vessel Right Left  Radial Palpable Palpable   Gastrointestinal: soft, non-distended. No guarding/no peritoneal signs.  Musculoskeletal: M/S 5/5 throughout.  No deformity or atrophy.  Neurologic: Pain and light touch intact in extremities.  Symmetrical.  Speech is fluent. Motor exam as listed above. Psychiatric: Judgment intact, Mood & affect appropriate for pt's clinical situation. Dermatologic: No Venous rashes. No Ulcers Noted.  No changes consistent with cellulitis. Lymph : No Cervical lymphadenopathy, no lichenification or skin changes of chronic lymphedema.      Assessment & Plan:   1. Complication of renal dialysis, sequela Based upon physical assessment, the fistula is indeed nonfunctioning.  There is no thrill or bruit present.  Recommend:  The patient is experiencing increasing problems with their dialysis access.  Patient should have a fistulagram with the intention for intervention.  The intention for intervention is to restore  appropriate flow and prevent thrombosis and possible loss of the access.  As well as improve the quality of dialysis therapy.  The risks, benefits and alternative therapies were reviewed in detail with the patient.  All questions were answered.  The patient agrees to proceed with angio/intervention.     2. Hyperlipidemia, unspecified hyperlipidemia type Continue statin as ordered and reviewed, no changes at this time   3. Essential hypertension Continue antihypertensive medications as already ordered, these medications have been reviewed and there are no changes at this time.    Current Outpatient Medications on File Prior to Visit  Medication Sig Dispense Refill  . amitriptyline (ELAVIL) 25 MG tablet Take 25 mg by mouth at bedtime.    Marland Kitchen aspirin EC 81 MG tablet TAKE ONE  BY MOUTH EVERY DAY 30 tablet 10  . calcium acetate (PHOSLO) 667 MG capsule Take 2 capsules (1,334 mg total) by mouth 3 (three) times daily with meals. 90 capsule 0  . ciprofloxacin (CIPRO) 500 MG tablet Take 1 tablet (500 mg total) by mouth daily for 10 days. 10 tablet 0  . gabapentin (NEURONTIN) 300 MG capsule Take 1 capsule (300 mg total) by mouth daily.    Marland Kitchen glipiZIDE (GLUCOTROL) 5 MG tablet 5 mg daily before breakfast.   3  . glucose blood (ACCU-CHEK AVIVA) test strip Use as instructed.use to check blood sugar up to 3 times a day  Disp: 1 box 100 each 11  . insulin aspart (NOVOLOG) 100 UNIT/ML injection Inject 12 Units into the skin 3 (three) times daily with meals. 10 mL 11  . insulin detemir (LEVEMIR) 100 UNIT/ML injection Inject 0.25 mLs (25 Units total) into the skin 2 (two) times daily. 10 mL 11  . Insulin Pen Needle 31G X 8 MM MISC 1 Container by Does not apply route QID. qid--any brand 100 each 11  . Insulin Syringes, Disposable, U-100 1 ML MISC Use as directed with insulin 100 each 3  . Lancets (ACCU-CHEK MULTICLIX) lancets Use as instructed.  check blood sugar up to 3 times a day Disp:  1 box 100 each 11  .  metoprolol tartrate (LOPRESSOR) 25 MG tablet Take 0.5 tablets (12.5 mg total) by mouth 2 (two) times daily. 60 tablet 1  .  metroNIDAZOLE (FLAGYL) 500 MG tablet Take 1 tablet (500 mg total) by mouth every 8 (eight) hours for 10 days. 30 tablet 0  . midodrine (PROAMATINE) 10 MG tablet Take 1 tablet (10 mg total) by mouth 2 (two) times daily with a meal. 60 tablet 1  . mirtazapine (REMERON) 15 MG tablet TK 1 T PO QD ATN  6  . multivitamin (RENA-VIT) TABS tablet Take 1 tablet by mouth at bedtime. 180 tablet 0  . omeprazole (PRILOSEC OTC) 20 MG tablet Take 1 tablet (20 mg total) by mouth daily. 30 tablet 1  . PROAIR HFA 108 (90 Base) MCG/ACT inhaler Inhale 2 puffs into the lungs every 4 (four) hours as needed for wheezing or shortness of breath.   0  . saccharomyces boulardii (FLORASTOR) 250 MG capsule Take 1 capsule (250 mg total) by mouth 2 (two) times daily. 30 capsule 0  . sodium bicarbonate 650 MG tablet Take 1 tablet (650 mg total) by mouth 2 (two) times daily. 60 tablet 1  . SYMBICORT 80-4.5 MCG/ACT inhaler Inhale 2 puffs into the lungs daily.   0   No current facility-administered medications on file prior to visit.     There are no Patient Instructions on file for this visit. No follow-ups on file.   Kris Hartmann, NP  This note was completed with Sales executive.  Any errors are purely unintentional.

## 2018-10-24 ENCOUNTER — Encounter (INDEPENDENT_AMBULATORY_CARE_PROVIDER_SITE_OTHER): Payer: Self-pay

## 2018-10-25 DIAGNOSIS — Z992 Dependence on renal dialysis: Secondary | ICD-10-CM | POA: Diagnosis not present

## 2018-10-25 DIAGNOSIS — N186 End stage renal disease: Secondary | ICD-10-CM | POA: Diagnosis not present

## 2018-10-25 DIAGNOSIS — N2581 Secondary hyperparathyroidism of renal origin: Secondary | ICD-10-CM | POA: Diagnosis not present

## 2018-10-25 DIAGNOSIS — D631 Anemia in chronic kidney disease: Secondary | ICD-10-CM | POA: Diagnosis not present

## 2018-10-25 DIAGNOSIS — D509 Iron deficiency anemia, unspecified: Secondary | ICD-10-CM | POA: Diagnosis not present

## 2018-10-28 ENCOUNTER — Other Ambulatory Visit (INDEPENDENT_AMBULATORY_CARE_PROVIDER_SITE_OTHER): Payer: Self-pay | Admitting: Vascular Surgery

## 2018-10-28 ENCOUNTER — Other Ambulatory Visit (INDEPENDENT_AMBULATORY_CARE_PROVIDER_SITE_OTHER): Payer: Self-pay | Admitting: Nurse Practitioner

## 2018-10-28 DIAGNOSIS — N186 End stage renal disease: Secondary | ICD-10-CM | POA: Diagnosis not present

## 2018-10-28 DIAGNOSIS — D631 Anemia in chronic kidney disease: Secondary | ICD-10-CM | POA: Diagnosis not present

## 2018-10-28 DIAGNOSIS — Z992 Dependence on renal dialysis: Secondary | ICD-10-CM | POA: Diagnosis not present

## 2018-10-28 DIAGNOSIS — D509 Iron deficiency anemia, unspecified: Secondary | ICD-10-CM | POA: Diagnosis not present

## 2018-10-28 DIAGNOSIS — N2581 Secondary hyperparathyroidism of renal origin: Secondary | ICD-10-CM | POA: Diagnosis not present

## 2018-10-30 DIAGNOSIS — D631 Anemia in chronic kidney disease: Secondary | ICD-10-CM | POA: Diagnosis not present

## 2018-10-30 DIAGNOSIS — N2581 Secondary hyperparathyroidism of renal origin: Secondary | ICD-10-CM | POA: Diagnosis not present

## 2018-10-30 DIAGNOSIS — D509 Iron deficiency anemia, unspecified: Secondary | ICD-10-CM | POA: Diagnosis not present

## 2018-10-30 DIAGNOSIS — N186 End stage renal disease: Secondary | ICD-10-CM | POA: Diagnosis not present

## 2018-10-30 DIAGNOSIS — Z992 Dependence on renal dialysis: Secondary | ICD-10-CM | POA: Diagnosis not present

## 2018-11-03 DIAGNOSIS — D509 Iron deficiency anemia, unspecified: Secondary | ICD-10-CM | POA: Diagnosis not present

## 2018-11-03 DIAGNOSIS — N186 End stage renal disease: Secondary | ICD-10-CM | POA: Diagnosis not present

## 2018-11-03 DIAGNOSIS — Z992 Dependence on renal dialysis: Secondary | ICD-10-CM | POA: Diagnosis not present

## 2018-11-03 DIAGNOSIS — D631 Anemia in chronic kidney disease: Secondary | ICD-10-CM | POA: Diagnosis not present

## 2018-11-03 DIAGNOSIS — N2581 Secondary hyperparathyroidism of renal origin: Secondary | ICD-10-CM | POA: Diagnosis not present

## 2018-11-03 MED ORDER — CEFAZOLIN SODIUM-DEXTROSE 1-4 GM/50ML-% IV SOLN: 1.0000 g | Freq: Once | INTRAVENOUS | Status: DC

## 2018-11-04 ENCOUNTER — Encounter
Admission: RE | Disposition: A | Discharge status: Home / Self Care | Payer: Self-pay | Source: Home / Self Care | Attending: Vascular Surgery

## 2018-11-04 ENCOUNTER — Ambulatory Visit
Admission: RE | Admit: 2018-11-04 | Discharge: 2018-11-04 | Disposition: A | Discharge status: Home / Self Care | Payer: Medicare Other | Attending: Vascular Surgery | Admitting: Vascular Surgery

## 2018-11-04 ENCOUNTER — Telehealth (INDEPENDENT_AMBULATORY_CARE_PROVIDER_SITE_OTHER): Payer: Self-pay | Admitting: Vascular Surgery

## 2018-11-04 ENCOUNTER — Other Ambulatory Visit: Payer: Self-pay

## 2018-11-04 DIAGNOSIS — N186 End stage renal disease: Secondary | ICD-10-CM | POA: Diagnosis not present

## 2018-11-04 DIAGNOSIS — Y832 Surgical operation with anastomosis, bypass or graft as the cause of abnormal reaction of the patient, or of later complication, without mention of misadventure at the time of the procedure: Secondary | ICD-10-CM | POA: Diagnosis not present

## 2018-11-04 DIAGNOSIS — Z79899 Other long term (current) drug therapy: Secondary | ICD-10-CM | POA: Diagnosis not present

## 2018-11-04 DIAGNOSIS — T82510A Breakdown (mechanical) of surgically created arteriovenous fistula, initial encounter: Secondary | ICD-10-CM | POA: Insufficient documentation

## 2018-11-04 DIAGNOSIS — E785 Hyperlipidemia, unspecified: Secondary | ICD-10-CM | POA: Diagnosis not present

## 2018-11-04 DIAGNOSIS — I120 Hypertensive chronic kidney disease with stage 5 chronic kidney disease or end stage renal disease: Secondary | ICD-10-CM | POA: Diagnosis not present

## 2018-11-04 DIAGNOSIS — Z992 Dependence on renal dialysis: Secondary | ICD-10-CM | POA: Insufficient documentation

## 2018-11-04 DIAGNOSIS — T82868A Thrombosis of vascular prosthetic devices, implants and grafts, initial encounter: Secondary | ICD-10-CM | POA: Diagnosis not present

## 2018-11-04 HISTORY — PX: A/V FISTULAGRAM: CATH118298

## 2018-11-04 LAB — GLUCOSE, CAPILLARY: Glucose-Capillary: 148 mg/dL — ABNORMAL HIGH (ref 70–99)

## 2018-11-04 LAB — POTASSIUM (ARMC VASCULAR LAB ONLY): Potassium (ARMC vascular lab): 3.9 (ref 3.5–5.1)

## 2018-11-04 SURGERY — A/V FISTULAGRAM
Anesthesia: Moderate Sedation | Site: Arm Upper | Laterality: Right

## 2018-11-04 MED ORDER — FENTANYL CITRATE (PF) 100 MCG/2ML IJ SOLN
INTRAMUSCULAR | Status: AC
  Filled 2018-11-04: qty 2

## 2018-11-04 MED ORDER — HYDROMORPHONE HCL 1 MG/ML IJ SOLN: 1.0000 mg | Freq: Once | INTRAMUSCULAR | Status: DC | PRN

## 2018-11-04 MED ORDER — LIDOCAINE HCL (PF) 1 % IJ SOLN
INTRAMUSCULAR | Status: AC
  Filled 2018-11-04: qty 30

## 2018-11-04 MED ORDER — FAMOTIDINE 20 MG PO TABS: 40.0000 mg | ORAL_TABLET | Freq: Once | ORAL | Status: DC | PRN

## 2018-11-04 MED ORDER — MIDAZOLAM HCL 2 MG/ML PO SYRP
ORAL_SOLUTION | ORAL | Status: AC
  Filled 2018-11-04: qty 4

## 2018-11-04 MED ORDER — MIDAZOLAM HCL 2 MG/2ML IJ SOLN
INTRAMUSCULAR | Status: DC | PRN
  Administered 2018-11-04 (×3): 1 mg via INTRAVENOUS

## 2018-11-04 MED ORDER — ONDANSETRON HCL 4 MG/2ML IJ SOLN: 4.0000 mg | Freq: Four times a day (QID) | INTRAMUSCULAR | Status: DC | PRN

## 2018-11-04 MED ORDER — HEPARIN SODIUM (PORCINE) 1000 UNIT/ML IJ SOLN
INTRAMUSCULAR | Status: AC
  Filled 2018-11-04: qty 1

## 2018-11-04 MED ORDER — DIPHENHYDRAMINE HCL 50 MG/ML IJ SOLN: 50.0000 mg | Freq: Once | INTRAMUSCULAR | Status: DC | PRN

## 2018-11-04 MED ORDER — METHYLPREDNISOLONE SODIUM SUCC 125 MG IJ SOLR: 125.0000 mg | Freq: Once | INTRAMUSCULAR | Status: DC | PRN

## 2018-11-04 MED ORDER — HEPARIN (PORCINE) IN NACL 1000-0.9 UT/500ML-% IV SOLN
INTRAVENOUS | Status: AC
  Filled 2018-11-04: qty 1000

## 2018-11-04 MED ORDER — MIDAZOLAM HCL 5 MG/5ML IJ SOLN
INTRAMUSCULAR | Status: AC
  Filled 2018-11-04: qty 5

## 2018-11-04 MED ORDER — FENTANYL CITRATE (PF) 100 MCG/2ML IJ SOLN
INTRAMUSCULAR | Status: DC | PRN
  Administered 2018-11-04: 50 ug via INTRAVENOUS
  Administered 2018-11-04: 25 ug via INTRAVENOUS

## 2018-11-04 MED ORDER — IOPAMIDOL (ISOVUE-300) INJECTION 61%
INTRAVENOUS | Status: DC | PRN
  Administered 2018-11-04: 20 mL via INTRA_ARTERIAL

## 2018-11-04 MED ORDER — MIDAZOLAM HCL 2 MG/ML PO SYRP
8.0000 mg | ORAL_SOLUTION | Freq: Once | ORAL | Status: AC
  Administered 2018-11-04: 8 mg via ORAL

## 2018-11-04 MED ORDER — SODIUM CHLORIDE 0.9 % IV SOLN
INTRAVENOUS | Status: DC
  Administered 2018-11-04: 1000 mL via INTRAVENOUS

## 2018-11-04 SURGICAL SUPPLY — 10 items
CATH BEACON 5 .035 65 KMP TIP (CATHETERS) ×2 IMPLANT
GUIDEWIRE ANGLED .035 180CM (WIRE) ×2 IMPLANT
NDL ENTRY 21GA 7CM ECHOTIP (NEEDLE) IMPLANT
NEEDLE ENTRY 21GA 7CM ECHOTIP (NEEDLE) ×3 IMPLANT
PACK ANGIOGRAPHY (CUSTOM PROCEDURE TRAY) ×3 IMPLANT
SET INTRO CAPELLA COAXIAL (SET/KITS/TRAYS/PACK) ×2 IMPLANT
SHEATH BRITE TIP 6FRX5.5 (SHEATH) ×2 IMPLANT
SHIELD RADPAD DADD DRAPE 4X9 (MISCELLANEOUS) ×2 IMPLANT
SUT MNCRL AB 4-0 PS2 18 (SUTURE) ×2 IMPLANT
TOWEL OR 17X26 4PK STRL BLUE (TOWEL DISPOSABLE) ×2 IMPLANT

## 2018-11-04 NOTE — Telephone Encounter (Signed)
Patient needs an appt in our office to discuss surgery without studies, please schedule with Dr. Delana Meyer. Thank you.

## 2018-11-04 NOTE — Telephone Encounter (Signed)
See below

## 2018-11-04 NOTE — Op Note (Signed)
OPERATIVE NOTE   PROCEDURE: 1. Contrast injection right arm fistula with central venogram  PRE-OPERATIVE DIAGNOSIS: Complication of dialysis access                                                       End Stage Renal Disease  POST-OPERATIVE DIAGNOSIS: same as above   SURGEON: Katha Cabal, M.D.  ANESTHESIA: Conscious sedation was administered under my direct supervision by the interventional radiology RN.  IV Versed plus fentanyl were utilized. Continuous ECG, pulse oximetry and blood pressure was monitored throughout the entire procedure.  Conscious sedation was for a total of 40 minutes.  ESTIMATED BLOOD LOSS: minimal  FINDING(S): 1. Right upper arm cephalic vein is widely patent and suitable for fistula creation the subclavian and innominate vein on the right are patent.  The superior vena cava proper demonstrates a smooth 50% narrowing currently associated with a tunneled dialysis catheter  SPECIMEN(S):  None  CONTRAST: 20 cc  FLUOROSCOPY TIME: 2.4 minutes  INDICATIONS: Steven [name] is a 58 y.o. male who  presents with malfunctioning right wrist AV access.  The patient is scheduled for angiography with possible intervention of the AV access to prevent loss of the permanent access.  The patient is aware the risks include but are not limited to: bleeding, infection, thrombosis of the cannulated access, and possible anaphylactic reaction to the contrast.  The patient acknowledges if the access can not be salvaged a tunneled catheter will be needed and will be placed during this procedure.  The patient is aware of the risks of the procedure and elects to proceed with the angiogram and intervention.  DESCRIPTION: After full informed written consent was obtained, the patient was brought back to the Special Procedure suite and placed supine position.  Appropriate cardiopulmonary monitors were placed.  The right arm was prepped and draped in the standard fashion.  Appropriate  timeout is called.   The right wrist AV access was cannulated with a micropuncture needle under ultrasound guidence.  Ultrasound was used to evaluate the right wrist access.  It was heterogeneous and non-compressible indicating it is not patent .  An ultrasound image was acquired for the permanent record.  A micropuncture needle was used to access the right wrist access under direct ultrasound guidance.  The microwire was then advanced under fluoroscopic guidance without difficulty followed by the micro-sheath.  The J-wire was then advanced and a 6 Fr sheath inserted.  Hand injections were completed to image the access from the arterial anastomosis through the entire access.  The central venous structures were also imaged by hand injections.  Based on the images, the previously stented right radiocephalic fistula is thrombosed and nonsalvageable.  At the level of the antecubital fossa the veins are patent verified both with ultrasound and then with venography.  The basilic vein is somewhat narrowed at the antecubital fossa but then increases in diameter and is widely patent up to its confluence with the subclavian vein.  Right upper arm cephalic vein is widely patent and suitable for fistula creation the subclavian and innominate vein on the right are patent.  The superior vena cava proper demonstrates a smooth 50% narrowing currently associated with a tunneled dialysis catheter   A 4-0 Monocryl purse-string suture was sewn around the sheath.  The sheath was removed and light  pressure was applied.  A sterile bandage was applied to the puncture site.   Summary: Patient appears to have a suitable upper arm cephalic vein for brachiocephalic fistula creation.  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  My hope is that a fistula would still mature quite readily and then with the tunnel  catheter removed the superior vena cava narrowing would not be an issue.  I would certainly choose this path with these findings over the possible creation of a thigh access within this particular patient.   COMPLICATIONS: None  CONDITION: Unchanged  Katha Cabal, M.D [facility] Office: [phone]  11/04/2018 12:08 PM

## 2018-11-04 NOTE — H&P (Signed)
[facility] History & Physical Update  The patient was interviewed and re-examined.  The patient's previous History and Physical has been reviewed and is unchanged.  There is no change in the plan of care. We plan to proceed with the scheduled procedure.  Hortencia Pilar, MD  11/04/2018, 10:55 AM

## 2018-11-05 ENCOUNTER — Encounter: Payer: Self-pay | Admitting: Vascular Surgery

## 2018-11-06 DIAGNOSIS — N186 End stage renal disease: Secondary | ICD-10-CM | POA: Diagnosis not present

## 2018-11-06 DIAGNOSIS — N2581 Secondary hyperparathyroidism of renal origin: Secondary | ICD-10-CM | POA: Diagnosis not present

## 2018-11-06 DIAGNOSIS — D509 Iron deficiency anemia, unspecified: Secondary | ICD-10-CM | POA: Diagnosis not present

## 2018-11-06 DIAGNOSIS — Z992 Dependence on renal dialysis: Secondary | ICD-10-CM | POA: Diagnosis not present

## 2018-11-06 DIAGNOSIS — D631 Anemia in chronic kidney disease: Secondary | ICD-10-CM | POA: Diagnosis not present

## 2018-11-07 DIAGNOSIS — N186 End stage renal disease: Secondary | ICD-10-CM | POA: Diagnosis not present

## 2018-11-07 DIAGNOSIS — Z992 Dependence on renal dialysis: Secondary | ICD-10-CM | POA: Diagnosis not present

## 2018-11-12 ENCOUNTER — Telehealth (INDEPENDENT_AMBULATORY_CARE_PROVIDER_SITE_OTHER): Payer: Self-pay

## 2018-11-12 ENCOUNTER — Ambulatory Visit (INDEPENDENT_AMBULATORY_CARE_PROVIDER_SITE_OTHER): Payer: Medicare Other | Admitting: Nurse Practitioner

## 2018-11-12 DIAGNOSIS — N2581 Secondary hyperparathyroidism of renal origin: Secondary | ICD-10-CM | POA: Diagnosis not present

## 2018-11-12 DIAGNOSIS — Z992 Dependence on renal dialysis: Secondary | ICD-10-CM | POA: Diagnosis not present

## 2018-11-12 DIAGNOSIS — N186 End stage renal disease: Secondary | ICD-10-CM | POA: Diagnosis not present

## 2018-11-12 DIAGNOSIS — D631 Anemia in chronic kidney disease: Secondary | ICD-10-CM | POA: Diagnosis not present

## 2018-11-12 DIAGNOSIS — D509 Iron deficiency anemia, unspecified: Secondary | ICD-10-CM | POA: Diagnosis not present

## 2018-11-12 NOTE — Telephone Encounter (Signed)
Patient called very upset because he doesn't trust the dialysis center and he states he is having trouble with his access. The patient had an appt in our office today for a 4 week f/u to discuss his access. The patient canceled it because it is his dialysis day. Patient is very upset because he wants something done. I explained that he needs to let his center know his concerns and have them fax over an order if they feel he needs intervention. Patient understood and stated he would speak with the center. The patient was the dialysis center at that time.

## 2018-11-18 DIAGNOSIS — Z992 Dependence on renal dialysis: Secondary | ICD-10-CM | POA: Diagnosis not present

## 2018-11-18 DIAGNOSIS — D509 Iron deficiency anemia, unspecified: Secondary | ICD-10-CM | POA: Diagnosis not present

## 2018-11-18 DIAGNOSIS — D631 Anemia in chronic kidney disease: Secondary | ICD-10-CM | POA: Diagnosis not present

## 2018-11-18 DIAGNOSIS — N2581 Secondary hyperparathyroidism of renal origin: Secondary | ICD-10-CM | POA: Diagnosis not present

## 2018-11-18 DIAGNOSIS — N186 End stage renal disease: Secondary | ICD-10-CM | POA: Diagnosis not present

## 2018-11-20 ENCOUNTER — Telehealth (INDEPENDENT_AMBULATORY_CARE_PROVIDER_SITE_OTHER): Payer: Self-pay | Admitting: Vascular Surgery

## 2018-11-20 DIAGNOSIS — Z992 Dependence on renal dialysis: Secondary | ICD-10-CM | POA: Diagnosis not present

## 2018-11-20 DIAGNOSIS — N186 End stage renal disease: Secondary | ICD-10-CM | POA: Diagnosis not present

## 2018-11-20 DIAGNOSIS — D631 Anemia in chronic kidney disease: Secondary | ICD-10-CM | POA: Diagnosis not present

## 2018-11-20 DIAGNOSIS — D509 Iron deficiency anemia, unspecified: Secondary | ICD-10-CM | POA: Diagnosis not present

## 2018-11-20 DIAGNOSIS — N2581 Secondary hyperparathyroidism of renal origin: Secondary | ICD-10-CM | POA: Diagnosis not present

## 2018-11-20 NOTE — Telephone Encounter (Signed)
Please advise on below  

## 2018-11-20 NOTE — Telephone Encounter (Signed)
Patient has already had the fistula replaced. Has a appointment on 11-21-18.

## 2018-11-20 NOTE — Telephone Encounter (Signed)
He needs to go to the ED. We don't have the ability to to replace a catheter in the office.  If he goes to the ED, he will be seen much faster, also they may need to give him IV antibiotics.  Once he is in the ED they will likely arrange perm cath replaced relatively quickly

## 2018-11-21 ENCOUNTER — Ambulatory Visit (INDEPENDENT_AMBULATORY_CARE_PROVIDER_SITE_OTHER): Payer: Medicare Other | Admitting: Nurse Practitioner

## 2018-11-21 ENCOUNTER — Encounter (INDEPENDENT_AMBULATORY_CARE_PROVIDER_SITE_OTHER): Payer: Medicare Other

## 2018-11-21 ENCOUNTER — Other Ambulatory Visit (INDEPENDENT_AMBULATORY_CARE_PROVIDER_SITE_OTHER): Payer: Medicare Other

## 2018-11-22 DIAGNOSIS — N186 End stage renal disease: Secondary | ICD-10-CM | POA: Diagnosis not present

## 2018-11-22 DIAGNOSIS — D509 Iron deficiency anemia, unspecified: Secondary | ICD-10-CM | POA: Diagnosis not present

## 2018-11-22 DIAGNOSIS — D631 Anemia in chronic kidney disease: Secondary | ICD-10-CM | POA: Diagnosis not present

## 2018-11-22 DIAGNOSIS — Z992 Dependence on renal dialysis: Secondary | ICD-10-CM | POA: Diagnosis not present

## 2018-11-22 DIAGNOSIS — N2581 Secondary hyperparathyroidism of renal origin: Secondary | ICD-10-CM | POA: Diagnosis not present

## 2018-11-25 DIAGNOSIS — D631 Anemia in chronic kidney disease: Secondary | ICD-10-CM | POA: Diagnosis not present

## 2018-11-25 DIAGNOSIS — N186 End stage renal disease: Secondary | ICD-10-CM | POA: Diagnosis not present

## 2018-11-25 DIAGNOSIS — Z992 Dependence on renal dialysis: Secondary | ICD-10-CM | POA: Diagnosis not present

## 2018-11-25 DIAGNOSIS — D509 Iron deficiency anemia, unspecified: Secondary | ICD-10-CM | POA: Diagnosis not present

## 2018-11-25 DIAGNOSIS — N2581 Secondary hyperparathyroidism of renal origin: Secondary | ICD-10-CM | POA: Diagnosis not present

## 2018-11-28 DIAGNOSIS — D631 Anemia in chronic kidney disease: Secondary | ICD-10-CM | POA: Diagnosis not present

## 2018-11-28 DIAGNOSIS — D509 Iron deficiency anemia, unspecified: Secondary | ICD-10-CM | POA: Diagnosis not present

## 2018-11-28 DIAGNOSIS — Z992 Dependence on renal dialysis: Secondary | ICD-10-CM | POA: Diagnosis not present

## 2018-11-28 DIAGNOSIS — N186 End stage renal disease: Secondary | ICD-10-CM | POA: Diagnosis not present

## 2018-11-28 DIAGNOSIS — N2581 Secondary hyperparathyroidism of renal origin: Secondary | ICD-10-CM | POA: Diagnosis not present

## 2018-12-04 DIAGNOSIS — D509 Iron deficiency anemia, unspecified: Secondary | ICD-10-CM | POA: Diagnosis not present

## 2018-12-04 DIAGNOSIS — N2581 Secondary hyperparathyroidism of renal origin: Secondary | ICD-10-CM | POA: Diagnosis not present

## 2018-12-04 DIAGNOSIS — D631 Anemia in chronic kidney disease: Secondary | ICD-10-CM | POA: Diagnosis not present

## 2018-12-04 DIAGNOSIS — N186 End stage renal disease: Secondary | ICD-10-CM | POA: Diagnosis not present

## 2018-12-04 DIAGNOSIS — Z992 Dependence on renal dialysis: Secondary | ICD-10-CM | POA: Diagnosis not present

## 2018-12-05 DIAGNOSIS — N2581 Secondary hyperparathyroidism of renal origin: Secondary | ICD-10-CM | POA: Diagnosis not present

## 2018-12-05 DIAGNOSIS — D509 Iron deficiency anemia, unspecified: Secondary | ICD-10-CM | POA: Diagnosis not present

## 2018-12-05 DIAGNOSIS — Z992 Dependence on renal dialysis: Secondary | ICD-10-CM | POA: Diagnosis not present

## 2018-12-05 DIAGNOSIS — N186 End stage renal disease: Secondary | ICD-10-CM | POA: Diagnosis not present

## 2018-12-05 DIAGNOSIS — D631 Anemia in chronic kidney disease: Secondary | ICD-10-CM | POA: Diagnosis not present

## 2018-12-05 DIAGNOSIS — T8249XA Other complication of vascular dialysis catheter, initial encounter: Secondary | ICD-10-CM | POA: Diagnosis not present

## 2018-12-06 DIAGNOSIS — D631 Anemia in chronic kidney disease: Secondary | ICD-10-CM | POA: Diagnosis not present

## 2018-12-06 DIAGNOSIS — Z992 Dependence on renal dialysis: Secondary | ICD-10-CM | POA: Diagnosis not present

## 2018-12-06 DIAGNOSIS — D509 Iron deficiency anemia, unspecified: Secondary | ICD-10-CM | POA: Diagnosis not present

## 2018-12-06 DIAGNOSIS — N2581 Secondary hyperparathyroidism of renal origin: Secondary | ICD-10-CM | POA: Diagnosis not present

## 2018-12-06 DIAGNOSIS — N186 End stage renal disease: Secondary | ICD-10-CM | POA: Diagnosis not present

## 2018-12-08 ENCOUNTER — Other Ambulatory Visit (INDEPENDENT_AMBULATORY_CARE_PROVIDER_SITE_OTHER): Payer: Medicare Other

## 2018-12-08 ENCOUNTER — Encounter (INDEPENDENT_AMBULATORY_CARE_PROVIDER_SITE_OTHER): Payer: Medicare Other

## 2018-12-08 ENCOUNTER — Ambulatory Visit (INDEPENDENT_AMBULATORY_CARE_PROVIDER_SITE_OTHER): Payer: Medicare Other | Admitting: Nurse Practitioner

## 2018-12-09 DIAGNOSIS — Z992 Dependence on renal dialysis: Secondary | ICD-10-CM | POA: Diagnosis not present

## 2018-12-09 DIAGNOSIS — T82898A Other specified complication of vascular prosthetic devices, implants and grafts, initial encounter: Secondary | ICD-10-CM | POA: Diagnosis not present

## 2018-12-09 DIAGNOSIS — D509 Iron deficiency anemia, unspecified: Secondary | ICD-10-CM | POA: Diagnosis not present

## 2018-12-09 DIAGNOSIS — D631 Anemia in chronic kidney disease: Secondary | ICD-10-CM | POA: Diagnosis not present

## 2018-12-09 DIAGNOSIS — N2581 Secondary hyperparathyroidism of renal origin: Secondary | ICD-10-CM | POA: Diagnosis not present

## 2018-12-09 DIAGNOSIS — N186 End stage renal disease: Secondary | ICD-10-CM | POA: Diagnosis not present

## 2018-12-10 ENCOUNTER — Telehealth (INDEPENDENT_AMBULATORY_CARE_PROVIDER_SITE_OTHER): Payer: Self-pay | Admitting: Vascular Surgery

## 2018-12-10 ENCOUNTER — Ambulatory Visit (INDEPENDENT_AMBULATORY_CARE_PROVIDER_SITE_OTHER): Payer: Medicare Other | Admitting: Nurse Practitioner

## 2018-12-10 ENCOUNTER — Encounter (INDEPENDENT_AMBULATORY_CARE_PROVIDER_SITE_OTHER): Payer: Medicare Other

## 2018-12-10 ENCOUNTER — Other Ambulatory Visit (INDEPENDENT_AMBULATORY_CARE_PROVIDER_SITE_OTHER): Payer: Medicare Other

## 2018-12-10 NOTE — Telephone Encounter (Signed)
Steven [name] had an appointment on Monday, I'm unsure why he wasn't able to make it.  Looking at Dr. Nino Parsley op note he identified suitable veins for fistula creation.  Steven [name] would need to come in and discuss fistula placement.  He can be seen by Dr. Delana Meyer or myself.  He shouldn't need a vein mapping.

## 2018-12-10 NOTE — Telephone Encounter (Signed)
Can we get more information on why the patient is requesting this ultrasound and the patient has a appointment on 01/14/19 in the office

## 2018-12-11 DIAGNOSIS — Z992 Dependence on renal dialysis: Secondary | ICD-10-CM | POA: Diagnosis not present

## 2018-12-11 DIAGNOSIS — N186 End stage renal disease: Secondary | ICD-10-CM | POA: Diagnosis not present

## 2018-12-11 DIAGNOSIS — D509 Iron deficiency anemia, unspecified: Secondary | ICD-10-CM | POA: Diagnosis not present

## 2018-12-11 DIAGNOSIS — T82898A Other specified complication of vascular prosthetic devices, implants and grafts, initial encounter: Secondary | ICD-10-CM | POA: Diagnosis not present

## 2018-12-11 DIAGNOSIS — N2581 Secondary hyperparathyroidism of renal origin: Secondary | ICD-10-CM | POA: Diagnosis not present

## 2018-12-11 DIAGNOSIS — D631 Anemia in chronic kidney disease: Secondary | ICD-10-CM | POA: Diagnosis not present

## 2018-12-12 ENCOUNTER — Ambulatory Visit (INDEPENDENT_AMBULATORY_CARE_PROVIDER_SITE_OTHER): Payer: Medicare Other | Admitting: Nurse Practitioner

## 2018-12-13 DIAGNOSIS — D631 Anemia in chronic kidney disease: Secondary | ICD-10-CM | POA: Diagnosis not present

## 2018-12-13 DIAGNOSIS — N2581 Secondary hyperparathyroidism of renal origin: Secondary | ICD-10-CM | POA: Diagnosis not present

## 2018-12-13 DIAGNOSIS — D509 Iron deficiency anemia, unspecified: Secondary | ICD-10-CM | POA: Diagnosis not present

## 2018-12-13 DIAGNOSIS — T82898A Other specified complication of vascular prosthetic devices, implants and grafts, initial encounter: Secondary | ICD-10-CM | POA: Diagnosis not present

## 2018-12-13 DIAGNOSIS — N186 End stage renal disease: Secondary | ICD-10-CM | POA: Diagnosis not present

## 2018-12-13 DIAGNOSIS — Z992 Dependence on renal dialysis: Secondary | ICD-10-CM | POA: Diagnosis not present

## 2018-12-15 ENCOUNTER — Emergency Department (HOSPITAL_COMMUNITY): Payer: Medicare Other

## 2018-12-15 ENCOUNTER — Encounter (HOSPITAL_COMMUNITY): Payer: Self-pay | Admitting: Emergency Medicine

## 2018-12-15 ENCOUNTER — Other Ambulatory Visit: Payer: Self-pay

## 2018-12-15 ENCOUNTER — Inpatient Hospital Stay (HOSPITAL_COMMUNITY)
Admission: EM | Admit: 2018-12-15 | Discharge: 2018-12-17 | DRG: 637 | Disposition: A | Discharge status: Home / Self Care | Payer: Medicare Other | Attending: Family Medicine | Admitting: Family Medicine

## 2018-12-15 ENCOUNTER — Ambulatory Visit (INDEPENDENT_AMBULATORY_CARE_PROVIDER_SITE_OTHER): Payer: Medicare Other | Admitting: Vascular Surgery

## 2018-12-15 DIAGNOSIS — Z794 Long term (current) use of insulin: Secondary | ICD-10-CM | POA: Diagnosis not present

## 2018-12-15 DIAGNOSIS — E876 Hypokalemia: Secondary | ICD-10-CM | POA: Diagnosis not present

## 2018-12-15 DIAGNOSIS — F1010 Alcohol abuse, uncomplicated: Secondary | ICD-10-CM | POA: Diagnosis present

## 2018-12-15 DIAGNOSIS — E871 Hypo-osmolality and hyponatremia: Secondary | ICD-10-CM | POA: Diagnosis present

## 2018-12-15 DIAGNOSIS — E785 Hyperlipidemia, unspecified: Secondary | ICD-10-CM | POA: Diagnosis present

## 2018-12-15 DIAGNOSIS — E1169 Type 2 diabetes mellitus with other specified complication: Secondary | ICD-10-CM | POA: Diagnosis not present

## 2018-12-15 DIAGNOSIS — Z720 Tobacco use: Secondary | ICD-10-CM | POA: Diagnosis not present

## 2018-12-15 DIAGNOSIS — Z7982 Long term (current) use of aspirin: Secondary | ICD-10-CM

## 2018-12-15 DIAGNOSIS — R4020 Unspecified coma: Secondary | ICD-10-CM | POA: Diagnosis not present

## 2018-12-15 DIAGNOSIS — Z992 Dependence on renal dialysis: Secondary | ICD-10-CM

## 2018-12-15 DIAGNOSIS — Z8249 Family history of ischemic heart disease and other diseases of the circulatory system: Secondary | ICD-10-CM | POA: Diagnosis not present

## 2018-12-15 DIAGNOSIS — I10 Essential (primary) hypertension: Secondary | ICD-10-CM | POA: Diagnosis not present

## 2018-12-15 DIAGNOSIS — R739 Hyperglycemia, unspecified: Secondary | ICD-10-CM | POA: Diagnosis present

## 2018-12-15 DIAGNOSIS — R0689 Other abnormalities of breathing: Secondary | ICD-10-CM | POA: Diagnosis not present

## 2018-12-15 DIAGNOSIS — R410 Disorientation, unspecified: Secondary | ICD-10-CM

## 2018-12-15 DIAGNOSIS — R402252 Coma scale, best verbal response, oriented, at arrival to emergency department: Secondary | ICD-10-CM | POA: Diagnosis present

## 2018-12-15 DIAGNOSIS — N186 End stage renal disease: Secondary | ICD-10-CM | POA: Diagnosis not present

## 2018-12-15 DIAGNOSIS — G9341 Metabolic encephalopathy: Secondary | ICD-10-CM | POA: Diagnosis not present

## 2018-12-15 DIAGNOSIS — D61818 Other pancytopenia: Secondary | ICD-10-CM | POA: Diagnosis present

## 2018-12-15 DIAGNOSIS — R404 Transient alteration of awareness: Secondary | ICD-10-CM | POA: Diagnosis not present

## 2018-12-15 DIAGNOSIS — E669 Obesity, unspecified: Secondary | ICD-10-CM | POA: Diagnosis not present

## 2018-12-15 DIAGNOSIS — S3993XA Unspecified injury of pelvis, initial encounter: Secondary | ICD-10-CM | POA: Diagnosis not present

## 2018-12-15 DIAGNOSIS — E1142 Type 2 diabetes mellitus with diabetic polyneuropathy: Secondary | ICD-10-CM | POA: Diagnosis present

## 2018-12-15 DIAGNOSIS — E872 Acidosis, unspecified: Secondary | ICD-10-CM | POA: Diagnosis present

## 2018-12-15 DIAGNOSIS — I4581 Long QT syndrome: Secondary | ICD-10-CM | POA: Diagnosis not present

## 2018-12-15 DIAGNOSIS — E1122 Type 2 diabetes mellitus with diabetic chronic kidney disease: Secondary | ICD-10-CM | POA: Diagnosis present

## 2018-12-15 DIAGNOSIS — E1165 Type 2 diabetes mellitus with hyperglycemia: Secondary | ICD-10-CM | POA: Diagnosis not present

## 2018-12-15 DIAGNOSIS — I120 Hypertensive chronic kidney disease with stage 5 chronic kidney disease or end stage renal disease: Secondary | ICD-10-CM | POA: Diagnosis not present

## 2018-12-15 DIAGNOSIS — K219 Gastro-esophageal reflux disease without esophagitis: Secondary | ICD-10-CM | POA: Diagnosis present

## 2018-12-15 DIAGNOSIS — S199XXA Unspecified injury of neck, initial encounter: Secondary | ICD-10-CM | POA: Diagnosis not present

## 2018-12-15 DIAGNOSIS — N189 Chronic kidney disease, unspecified: Secondary | ICD-10-CM | POA: Diagnosis not present

## 2018-12-15 DIAGNOSIS — S0990XA Unspecified injury of head, initial encounter: Secondary | ICD-10-CM | POA: Diagnosis not present

## 2018-12-15 DIAGNOSIS — R402362 Coma scale, best motor response, obeys commands, at arrival to emergency department: Secondary | ICD-10-CM | POA: Diagnosis present

## 2018-12-15 DIAGNOSIS — R402142 Coma scale, eyes open, spontaneous, at arrival to emergency department: Secondary | ICD-10-CM | POA: Diagnosis present

## 2018-12-15 DIAGNOSIS — Z862 Personal history of diseases of the blood and blood-forming organs and certain disorders involving the immune mechanism: Secondary | ICD-10-CM | POA: Diagnosis not present

## 2018-12-15 DIAGNOSIS — D631 Anemia in chronic kidney disease: Secondary | ICD-10-CM | POA: Diagnosis present

## 2018-12-15 DIAGNOSIS — S299XXA Unspecified injury of thorax, initial encounter: Secondary | ICD-10-CM | POA: Diagnosis not present

## 2018-12-15 DIAGNOSIS — Z6835 Body mass index (BMI) 35.0-35.9, adult: Secondary | ICD-10-CM | POA: Diagnosis not present

## 2018-12-15 LAB — URINALYSIS, ROUTINE W REFLEX MICROSCOPIC
Bacteria, UA: NONE SEEN
Bilirubin Urine: NEGATIVE
Ketones, ur: NEGATIVE mg/dL
Leukocytes,Ua: NEGATIVE
Nitrite: NEGATIVE
PH: 7 (ref 5.0–8.0)
Protein, ur: 100 mg/dL — AB
Specific Gravity, Urine: 1.009 (ref 1.005–1.030)

## 2018-12-15 LAB — GLUCOSE, CAPILLARY: Glucose-Capillary: 196 mg/dL — ABNORMAL HIGH (ref 70–99)

## 2018-12-15 LAB — COMPREHENSIVE METABOLIC PANEL
ALT: 54 U/L — ABNORMAL HIGH (ref 0–44)
AST: 91 U/L — ABNORMAL HIGH (ref 15–41)
Albumin: 2.6 g/dL — ABNORMAL LOW (ref 3.5–5.0)
Alkaline Phosphatase: 138 U/L — ABNORMAL HIGH (ref 38–126)
Anion gap: 15 (ref 5–15)
BUN: 27 mg/dL — ABNORMAL HIGH (ref 6–20)
CO2: 23 mmol/L (ref 22–32)
Calcium: 7.9 mg/dL — ABNORMAL LOW (ref 8.9–10.3)
Chloride: 90 mmol/L — ABNORMAL LOW (ref 98–111)
Creatinine, Ser: 4.47 mg/dL — ABNORMAL HIGH (ref 0.61–1.24)
GFR calc Af Amer: 16 mL/min — ABNORMAL LOW (ref >60)
GFR calc non Af Amer: 14 mL/min — ABNORMAL LOW (ref >60)
Glucose, Bld: 434 mg/dL — ABNORMAL HIGH (ref 70–99)
POTASSIUM: 3.4 mmol/L — AB (ref 3.5–5.1)
Sodium: 128 mmol/L — ABNORMAL LOW (ref 135–145)
Total Bilirubin: 0.6 mg/dL (ref 0.3–1.2)
Total Protein: 6.2 g/dL — ABNORMAL LOW (ref 6.5–8.1)

## 2018-12-15 LAB — CBC WITH DIFFERENTIAL/PLATELET
Abs Immature Granulocytes: 0.02 10*3/uL (ref 0.00–0.07)
Basophils Absolute: 0 10*3/uL (ref 0.0–0.1)
Basophils Relative: 1 %
EOS ABS: 0 10*3/uL (ref 0.0–0.5)
EOS PCT: 1 %
HCT: 29 % — ABNORMAL LOW (ref 39.0–52.0)
Hemoglobin: 8.9 g/dL — ABNORMAL LOW (ref 13.0–17.0)
Immature Granulocytes: 1 %
Lymphocytes Relative: 18 %
Lymphs Abs: 0.4 10*3/uL — ABNORMAL LOW (ref 0.7–4.0)
MCH: 30.5 pg (ref 26.0–34.0)
MCHC: 30.7 g/dL (ref 30.0–36.0)
MCV: 99.3 fL (ref 80.0–100.0)
Monocytes Absolute: 0.2 10*3/uL (ref 0.1–1.0)
Monocytes Relative: 11 %
Neutro Abs: 1.5 10*3/uL — ABNORMAL LOW (ref 1.7–7.7)
Neutrophils Relative %: 68 %
Platelets: 113 10*3/uL — ABNORMAL LOW (ref 150–400)
RBC: 2.92 MIL/uL — ABNORMAL LOW (ref 4.22–5.81)
RDW: 14.5 % (ref 11.5–15.5)
WBC: 2.2 10*3/uL — ABNORMAL LOW (ref 4.0–10.5)
nRBC: 0 % (ref 0.0–0.2)

## 2018-12-15 LAB — LACTIC ACID, PLASMA
Lactic Acid, Venous: 3.9 mmol/L (ref 0.5–1.9)
Lactic Acid, Venous: 2 mmol/L (ref 0.5–1.9)

## 2018-12-15 LAB — BLOOD GAS, VENOUS
Acid-Base Excess: 0.8 mmol/L (ref 0.0–2.0)
Bicarbonate: 23.4 mmol/L (ref 20.0–28.0)
FIO2: 21
O2 Saturation: 25.4 %
Patient temperature: 36.7
pCO2, Ven: 45 mmHg (ref 44.0–60.0)
pH, Ven: 7.372 (ref 7.250–7.430)
pO2, Ven: <31 mmHg — CL (ref 32.0–45.0)

## 2018-12-15 LAB — RAPID URINE DRUG SCREEN, HOSP PERFORMED
AMPHETAMINES: NOT DETECTED
Barbiturates: NOT DETECTED
Benzodiazepines: NOT DETECTED
Cocaine: NOT DETECTED
Opiates: NOT DETECTED
Tetrahydrocannabinol: NOT DETECTED

## 2018-12-15 LAB — SALICYLATE LEVEL: Salicylate Lvl: <7 mg/dL (ref 2.8–30.0)

## 2018-12-15 LAB — CBG MONITORING, ED
Glucose-Capillary: 416 mg/dL — ABNORMAL HIGH (ref 70–99)
Glucose-Capillary: 338 mg/dL — ABNORMAL HIGH (ref 70–99)

## 2018-12-15 LAB — ACETAMINOPHEN LEVEL: Acetaminophen (Tylenol), Serum: <10 ug/mL — ABNORMAL LOW (ref 10–30)

## 2018-12-15 LAB — TROPONIN I: TROPONIN I: 0.05 ng/mL — AB (ref <0.03)

## 2018-12-15 LAB — MAGNESIUM: MAGNESIUM: 1.7 mg/dL (ref 1.7–2.4)

## 2018-12-15 LAB — AMMONIA: Ammonia: 26 umol/L (ref 9–35)

## 2018-12-15 LAB — ETHANOL: Alcohol, Ethyl (B): <10 mg/dL (ref <10)

## 2018-12-15 MED ORDER — ASPIRIN 325 MG PO TABS
325.0000 mg | ORAL_TABLET | Freq: Every day | ORAL | Status: DC
  Administered 2018-12-16 – 2018-12-17 (×2): 325 mg via ORAL
  Filled 2018-12-15 (×3): qty 1

## 2018-12-15 MED ORDER — SODIUM BICARBONATE 650 MG PO TABS
650.0000 mg | ORAL_TABLET | Freq: Two times a day (BID) | ORAL | Status: DC
  Administered 2018-12-16 – 2018-12-17 (×3): 650 mg via ORAL
  Filled 2018-12-15 (×4): qty 1

## 2018-12-15 MED ORDER — HEPARIN SODIUM (PORCINE) 5000 UNIT/ML IJ SOLN: 5000.0000 [IU] | Freq: Three times a day (TID) | INTRAMUSCULAR | Status: DC

## 2018-12-15 MED ORDER — CALCIUM ACETATE (PHOS BINDER) 667 MG PO CAPS
1334.0000 mg | ORAL_CAPSULE | Freq: Three times a day (TID) | ORAL | Status: DC
  Administered 2018-12-16 – 2018-12-17 (×3): 1334 mg via ORAL
  Filled 2018-12-15 (×10): qty 2

## 2018-12-15 MED ORDER — ALBUTEROL SULFATE (2.5 MG/3ML) 0.083% IN NEBU: 2.5000 mg | INHALATION_SOLUTION | RESPIRATORY_TRACT | Status: DC | PRN

## 2018-12-15 MED ORDER — ACETAMINOPHEN 325 MG PO TABS: 650.0000 mg | ORAL_TABLET | Freq: Four times a day (QID) | ORAL | Status: DC | PRN

## 2018-12-15 MED ORDER — SODIUM CHLORIDE 0.9 % IV BOLUS
500.0000 mL | Freq: Once | INTRAVENOUS | Status: AC
  Administered 2018-12-15: 500 mL via INTRAVENOUS

## 2018-12-15 MED ORDER — INSULIN DETEMIR 100 UNIT/ML ~~LOC~~ SOLN
20.0000 [IU] | Freq: Every day | SUBCUTANEOUS | Status: DC
  Administered 2018-12-16: 20 [IU] via SUBCUTANEOUS
  Filled 2018-12-15 (×4): qty 0.2

## 2018-12-15 MED ORDER — FUROSEMIDE 80 MG PO TABS
80.0000 mg | ORAL_TABLET | Freq: Every day | ORAL | Status: DC
  Administered 2018-12-16 – 2018-12-17 (×2): 80 mg via ORAL
  Filled 2018-12-15 (×2): qty 1

## 2018-12-15 MED ORDER — NEPRO/CARBSTEADY PO LIQD: 237.0000 mL | Freq: Two times a day (BID) | ORAL | Status: DC

## 2018-12-15 MED ORDER — METOPROLOL TARTRATE 25 MG PO TABS
12.5000 mg | ORAL_TABLET | Freq: Two times a day (BID) | ORAL | Status: DC
  Administered 2018-12-16 – 2018-12-17 (×3): 12.5 mg via ORAL
  Filled 2018-12-15 (×4): qty 1

## 2018-12-15 MED ORDER — INSULIN ASPART 100 UNIT/ML ~~LOC~~ SOLN
0.0000 [IU] | Freq: Three times a day (TID) | SUBCUTANEOUS | Status: DC
  Administered 2018-12-15: 11 [IU] via SUBCUTANEOUS
  Administered 2018-12-16: 3 [IU] via SUBCUTANEOUS

## 2018-12-15 MED ORDER — MOMETASONE FURO-FORMOTEROL FUM 100-5 MCG/ACT IN AERO
2.0000 | INHALATION_SPRAY | Freq: Every day | RESPIRATORY_TRACT | Status: DC
  Administered 2018-12-15: 2 via RESPIRATORY_TRACT
  Filled 2018-12-15: qty 8.8

## 2018-12-15 MED ORDER — MOMETASONE FURO-FORMOTEROL FUM 100-5 MCG/ACT IN AERO
INHALATION_SPRAY | RESPIRATORY_TRACT | Status: AC
  Filled 2018-12-15: qty 8.8

## 2018-12-15 MED ORDER — SODIUM CHLORIDE 0.9 % IV SOLN
INTRAVENOUS | Status: DC
  Administered 2018-12-15: 14:00:00 via INTRAVENOUS

## 2018-12-15 MED ORDER — ACETAMINOPHEN 650 MG RE SUPP: 650.0000 mg | Freq: Four times a day (QID) | RECTAL | Status: DC | PRN

## 2018-12-15 NOTE — ED Notes (Signed)
CRITICAL VALUE ALERT  Critical Value:  Lactic 3.9  Date & Time Notied:  12/15/2018 @ 1503  Provider Notified: Dr Reather Converse  Orders Received/Actions taken: see new orders

## 2018-12-15 NOTE — ED Notes (Signed)
CRITICAL VALUE ALERT  Critical Value: PO2 <31  Date & Time Notied:  12/15/2018 @ 4144  Provider Notified: Dr Reather Converse  Orders Received/Actions taken: See new orders
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
## 2018-12-15 NOTE — ED Notes (Signed)
CRITICAL VALUE ALERT  Critical Value:  Lactic Acid - 2.0  Date & Time Notied:  12/15/18    1924  Provider Notified: Dr Velia Meyer  Orders Received/Actions taken:

## 2018-12-15 NOTE — Progress Notes (Signed)
Received to room 340 from ER via stretcher. Assisted to bed and positioned for comfort. Oriented to room, bed and unit.

## 2018-12-15 NOTE — H&P (Signed)
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 Monitor on telemetry.  Continue home full dose aspirin.  Will repeat troponin in the morning, but with the understanding that there may be a relative increase in his value at that time purely on the basis of end-stage renal disease, with plan for hemodialysis tomorrow.    #) Hyperglycemia in the setting of type 2 diabetes mellitus: Presenting blood sugar noted to be 434, per BMP.  The patient reports good compliance with his home insulin regimen which consists of Levemir 20 units daily as well as sliding scale  NovoLog 12 to 20 units 3 times daily with meals, but acknowledges that he has not been frequently checking his blood sugars at home over the last few weeks due to running very low on his diabetic testing supplies.  He is also on glipizide at home.  No evidence of associated DKA.  Overall, it is unclear at this time if presenting hyperglycemia was contributory to  patient's confusion, or if it was as a consequence of some other primary process leading to the patient's acute encephalopathy.   Plan: Stat point of care glucose to be checked now.  Resume home Levemir 20 units Qdaily, patient typically takes in the afternoon, with next dose to occur now as the patient has not yet taken his basal insulin for the day.  Accu-Cheks every 6 hours with sliding scale insulin.  Also check hemoglobin A1c.  Will hold home glipizide during this hospitalization.     #) End-stage renal disease: On hemodialysis on a Tuesday, Thursday, Saturday schedule, with most recent HD session occurring as scheduled on Saturday, March 7.  Per the schedule, the patient will be due for hemodialysis tomorrow, March 10th.  No clinical evidence at this time to suggest need for urgent or emergent overnight hemodialysis this evening.   Plan: Anticipate nephrology consult in the morning to arrange for hemodialysis tomorrow (Tuesday, March 10).  Renal diet.  Continue PhosLo and oral sodium bicarbonate.  Monitor strict I's and O's repeat BMP in the morning as well as serum Mg and Phos levels.      #) Anemia of chronic kidney disease: Associated with baseline hemoglobin of 10-11.  Presenting labs reflect normocytic, normochromic hemoglobin of 8.9, in the absence of any evidence to suggest acute bleed at this time.  Plan: Repeat CBC in the morning.  Work-up and management of end-stage renal disease, including anticipated hemodialysis tomorrow, as above.    DVT prophylaxis: scd's Code Status: full Family Communication: (none) Disposition Plan:  Per Rounding Team Consults called: (none)  Admission status: Inpatient; med telemetry.    PLEASE NOTE THAT DRAGON DICTATION SOFTWARE WAS USED IN THE CONSTRUCTION OF THIS NOTE.   [place] [facility] Pager [phone] From 3PM- 11PM.   Otherwise, please contact night-coverage  [email or web address] Password [email or web address]  12/15/2018, 4:06  PM

## 2018-12-15 NOTE — Progress Notes (Deleted)
MRN : [id number]  Steven [name] is a 58 y.o. (11-Jul-1961) male who presents with chief complaint of No chief complaint on file. Marland Kitchen  History of Present Illness:    The patient is seen for evaluation of dialysis access.  The patient has a history of multiple failed accesses.  There have been accesses in both arms and in the thighs.    Current access is via a catheter which is functioning poorly.  There have been several episodes of catheter infection.  Previous fistulogram showed a thrombosed right wrist fistula.  Right upper arm cephalic vein is widely patent and suitable for fistula creation the subclavian and innominate vein on the right are patent.  The superior vena cava proper demonstrates a smooth 50% narrowing currently associated with a tunneled dialysis catheter  The patient denies amaurosis fugax or recent TIA symptoms. There are no recent neurological changes noted. The patient denies claudication symptoms or rest pain symptoms. The patient denies history of DVT, PE or superficial thrombophlebitis. The patient denies recent episodes of angina or shortness of breath.    No outpatient medications have been marked as taking for the 12/15/18 encounter (Appointment) with Delana Meyer, Dolores Lory, MD.    Past Medical History:  Diagnosis Date  . Anemia   . Cellulitis and abscess of right lower extremity 03/01/2017  . Chronic kidney disease    dialysis m,w,f  . Diabetes mellitus without complication ([place])   . Edema extremities 03/01/2017   bilateral swelling  . GERD (gastroesophageal reflux disease)   . High cholesterol   . High triglycerides   . Hypertension   . Neuropathy   . Sleep apnea    can't use cpap d/t feelings of suffocation    Past Surgical History:  Procedure Laterality Date  . A/V FISTULAGRAM Right 08/27/2017   Procedure: A/V FISTULAGRAM;  Surgeon: Katha Cabal, MD;  Location: [place] [facility];  Service: Cardiovascular;  Laterality: Right;  . A/V  FISTULAGRAM Right 11/04/2018   Procedure: A/V FISTULAGRAM;  Surgeon: Katha Cabal, MD;  Location: [place] [facility];  Service: Cardiovascular;  Laterality: Right;  . A/V SHUNT INTERVENTION N/A 06/23/2018   Procedure: A/V SHUNT INTERVENTION;  Surgeon: Algernon Huxley, MD;  Location: [place] [facility];  Service: Cardiovascular;  Laterality: N/A;  . APPENDECTOMY  2010  . AV FISTULA PLACEMENT Right 06/14/2017   Procedure: ARTERIOVENOUS (AV) FISTULA CREATION WRIST;  Surgeon: Katha Cabal, MD;  Location: [facility];  Service: Vascular;  Laterality: Right;  . DIALYSIS/PERMA CATHETER INSERTION N/A 03/05/2017   Procedure: Dialysis/Perma Catheter Insertion;  Surgeon: Katha Cabal, MD;  Location: [place] [facility];  Service: Cardiovascular;  Laterality: N/A;  . DIALYSIS/PERMA CATHETER INSERTION N/A 09/20/2017   Procedure: DIALYSIS/PERMA CATHETER INSERTION;  Surgeon: Katha Cabal, MD;  Location: [place] [facility];  Service: Cardiovascular;  Laterality: N/A;  . EXCHANGE OF A DIALYSIS CATHETER  03/29/2017   Procedure: Exchange Of A Dialysis Catheter;  Surgeon: Katha Cabal, MD;  Location: [place] [facility];  Service: Cardiovascular;;  . IRRIGATION AND DEBRIDEMENT FOOT Right 03/01/2017   Procedure: IRRIGATION AND DEBRIDEMENT FOOT;  Surgeon: Albertine Patricia, DPM;  Location: [facility];  Service: Podiatry;  Laterality: Right;  application of wound vac  . PERIPHERAL VASCULAR THROMBECTOMY Right 06/18/2018   Procedure: PERIPHERAL VASCULAR THROMBECTOMY;  Surgeon: Algernon Huxley, MD;  Location: [place] [facility];  Service: Cardiovascular;  Laterality: Right;    Social History Social History  Tobacco Use  . Smoking status: Never Smoker  . Smokeless tobacco: Current User    Types: Snuff  Substance Use Topics  . 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 Drug use: No    Family History Family History  Problem Relation Age of Onset  . CAD  Father   . Heart disease Father     Allergies  Allergen Reactions  . Codeine Nausea And Vomiting     REVIEW OF SYSTEMS (Negative unless checked)  Constitutional: [] Weight loss  [] Fever  [] Chills Cardiac: [] Chest pain   [] Chest pressure   [] Palpitations   [] Shortness of breath when laying flat   [] Shortness of breath with exertion. Vascular:  [] Pain in legs with walking   [] Pain in legs at rest  [] History of DVT   [] Phlebitis   [] Swelling in legs   [] Varicose veins   [] Non-healing ulcers Pulmonary:   [] Uses home oxygen   [] Productive cough   [] Hemoptysis   [] Wheeze  [] COPD   [] Asthma Neurologic:  [] Dizziness   [] Seizures   [] History of stroke   [] History of TIA  [] Aphasia   [] Vissual changes   [] Weakness or numbness in arm   [] Weakness or numbness in leg Musculoskeletal:   [] Joint swelling   [] Joint pain   [] Low back pain Hematologic:  [] Easy bruising  [] Easy bleeding   [] Hypercoagulable state   [] Anemic Gastrointestinal:  [] Diarrhea   [] Vomiting  [] Gastroesophageal reflux/heartburn   [] Difficulty swallowing. Genitourinary:  [x] Chronic kidney disease   [] Difficult urination  [] Frequent urination   [] Blood in urine Skin:  [] Rashes   [] Ulcers  Psychological:  [] History of anxiety   []  History of major depression.  Physical Examination  There were no vitals filed for this visit. There is no height or weight on file to calculate BMI. Gen: WD/WN, NAD Head: [place]/AT, No temporalis wasting.  Ear/Nose/Throat: Hearing grossly intact, nares w/o erythema or drainage Eyes: PER, EOMI, sclera nonicteric.  Neck: Supple, no large masses.   Pulmonary:  Good air movement, no audible wheezing bilaterally, no use of accessory muscles.  Cardiac: RRR, no JVD Vascular: right wrist fistula no thrill no bruit, cephalic vein in upper arm palpable Vessel Right Left  Radial Palpable Palpable  Ulnar Palpable Palpable  Brachial Palpable Palpable  Carotid Palpable Palpable  Gastrointestinal: Non-distended. No  guarding/no peritoneal signs.  Musculoskeletal: M/S 5/5 throughout.  No deformity or atrophy.  Neurologic: CN 2-12 intact. Symmetrical.  Speech is fluent. Motor exam as listed above. Psychiatric: Judgment intact, Mood & affect appropriate for pt's clinical situation. Dermatologic: No rashes or ulcers noted.  No changes consistent with cellulitis. Lymph : No lichenification or skin changes of chronic lymphedema.  CBC Lab Results  Component Value Date   WBC 8.5 10/17/2018   HGB 11.1 (L) 10/17/2018   HCT 34.5 (L) 10/17/2018   MCV 94.3 10/17/2018   PLT 152 10/17/2018    BMET    Component Value Date/Time   NA 128 (L) 10/18/2018 0614   K 3.7 10/18/2018 0614   CL 94 (L) 10/18/2018 0614   CO2 19 (L) 10/18/2018 0614   GLUCOSE 224 (H) 10/18/2018 0614   BUN 40 (H) 10/18/2018 0614   CREATININE 8.01 (H) 10/18/2018 0614   CREATININE 1.41 (H) 12/07/2011 1030   CALCIUM 7.5 (L) 10/18/2018 0614   GFRNONAA 7 (L) 10/18/2018 0614   GFRAA 8 (L) 10/18/2018 1610   CrCl cannot be calculated (Patient's most recent lab result is  older than the maximum 21 days allowed.).  COAG Lab Results  Component Value Date   INR 1.07 05/23/2017    Radiology No results found.   Assessment/Plan There are no diagnoses linked to this encounter.   Hortencia Pilar, MD  12/15/2018 8:13 AM
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
## 2018-12-15 NOTE — ED Notes (Signed)
Hospitalist in to evaluate.

## 2018-12-15 NOTE — ED Notes (Signed)
CRITICAL VALUE ALERT  Critical Value:  Troponin 0.05  Date & Time Notied:  12/15/2018 @ 1525  Provider Notified: Dr Reather Converse  Orders Received/Actions taken: see new orders.

## 2018-12-16 ENCOUNTER — Inpatient Hospital Stay (HOSPITAL_COMMUNITY): Payer: Medicare Other

## 2018-12-16 DIAGNOSIS — N186 End stage renal disease: Secondary | ICD-10-CM

## 2018-12-16 DIAGNOSIS — Z992 Dependence on renal dialysis: Secondary | ICD-10-CM

## 2018-12-16 DIAGNOSIS — I10 Essential (primary) hypertension: Secondary | ICD-10-CM

## 2018-12-16 LAB — GLUCOSE, CAPILLARY
GLUCOSE-CAPILLARY: 56 mg/dL — AB (ref 70–99)
Glucose-Capillary: 95 mg/dL (ref 70–99)
Glucose-Capillary: 170 mg/dL — ABNORMAL HIGH (ref 70–99)
Glucose-Capillary: 108 mg/dL — ABNORMAL HIGH (ref 70–99)

## 2018-12-16 LAB — CBC
HCT: 29.3 % — ABNORMAL LOW (ref 39.0–52.0)
Hemoglobin: 9 g/dL — ABNORMAL LOW (ref 13.0–17.0)
MCH: 31 pg (ref 26.0–34.0)
MCHC: 30.7 g/dL (ref 30.0–36.0)
MCV: 101 fL — ABNORMAL HIGH (ref 80.0–100.0)
Platelets: 113 10*3/uL — ABNORMAL LOW (ref 150–400)
RBC: 2.9 MIL/uL — ABNORMAL LOW (ref 4.22–5.81)
RDW: 14.5 % (ref 11.5–15.5)
WBC: 2.9 10*3/uL — ABNORMAL LOW (ref 4.0–10.5)
nRBC: 0 % (ref 0.0–0.2)

## 2018-12-16 LAB — COMPREHENSIVE METABOLIC PANEL
ALT: 52 U/L — ABNORMAL HIGH (ref 0–44)
AST: 92 U/L — ABNORMAL HIGH (ref 15–41)
Albumin: 2.4 g/dL — ABNORMAL LOW (ref 3.5–5.0)
Alkaline Phosphatase: 121 U/L (ref 38–126)
Anion gap: 11 (ref 5–15)
BUN: 27 mg/dL — ABNORMAL HIGH (ref 6–20)
CO2: 27 mmol/L (ref 22–32)
Calcium: 7.9 mg/dL — ABNORMAL LOW (ref 8.9–10.3)
Chloride: 97 mmol/L — ABNORMAL LOW (ref 98–111)
Creatinine, Ser: 4.91 mg/dL — ABNORMAL HIGH (ref 0.61–1.24)
GFR calc Af Amer: 14 mL/min — ABNORMAL LOW (ref >60)
GFR calc non Af Amer: 12 mL/min — ABNORMAL LOW (ref >60)
GLUCOSE: 72 mg/dL (ref 70–99)
Potassium: 2.9 mmol/L — ABNORMAL LOW (ref 3.5–5.1)
Sodium: 135 mmol/L (ref 135–145)
TOTAL PROTEIN: 5.8 g/dL — AB (ref 6.5–8.1)
Total Bilirubin: 0.7 mg/dL (ref 0.3–1.2)

## 2018-12-16 LAB — PHOSPHORUS: Phosphorus: 4.3 mg/dL (ref 2.5–4.6)

## 2018-12-16 LAB — HEMOGLOBIN A1C
Hgb A1c MFr Bld: 8.6 % — ABNORMAL HIGH (ref 4.8–5.6)
Mean Plasma Glucose: 200.12 mg/dL

## 2018-12-16 LAB — MAGNESIUM: Magnesium: 1.6 mg/dL — ABNORMAL LOW (ref 1.7–2.4)

## 2018-12-16 LAB — MRSA PCR SCREENING: MRSA by PCR: NEGATIVE

## 2018-12-16 LAB — PROTIME-INR
INR: 0.9 (ref 0.8–1.2)
Prothrombin Time: 12.2 seconds (ref 11.4–15.2)

## 2018-12-16 LAB — TSH: TSH: 1.88 u[IU]/mL (ref 0.350–4.500)

## 2018-12-16 LAB — TROPONIN I: TROPONIN I: 0.05 ng/mL — AB (ref <0.03)

## 2018-12-16 MED ORDER — HEPARIN SODIUM (PORCINE) 1000 UNIT/ML DIALYSIS: 1000.0000 [IU] | INTRAMUSCULAR | Status: DC | PRN

## 2018-12-16 MED ORDER — INSULIN DETEMIR 100 UNIT/ML ~~LOC~~ SOLN: 20.0000 [IU] | Freq: Every day | SUBCUTANEOUS | Status: DC

## 2018-12-16 MED ORDER — SODIUM CHLORIDE 0.9 % IV SOLN: 100.0000 mL | INTRAVENOUS | Status: DC | PRN

## 2018-12-16 MED ORDER — CALCIUM ACETATE (PHOS BINDER) 667 MG PO CAPS: 1334.0000 mg | ORAL_CAPSULE | Freq: Three times a day (TID) | ORAL | 1 refills | Status: DC

## 2018-12-16 MED ORDER — EPOETIN ALFA 10000 UNIT/ML IJ SOLN
8000.0000 [IU] | INTRAMUSCULAR | Status: DC
  Administered 2018-12-16: 8000 [IU] via INTRAVENOUS

## 2018-12-16 MED ORDER — ALTEPLASE 2 MG IJ SOLR
2.0000 mg | Freq: Once | INTRAMUSCULAR | Status: AC | PRN
  Administered 2018-12-16: 4 mg

## 2018-12-16 MED ORDER — DOXERCALCIFEROL 0.5 MCG PO CAPS
1.0000 ug | ORAL_CAPSULE | ORAL | Status: DC
  Filled 2018-12-16: qty 2

## 2018-12-16 MED ORDER — MOMETASONE FURO-FORMOTEROL FUM 100-5 MCG/ACT IN AERO: 2.0000 | INHALATION_SPRAY | RESPIRATORY_TRACT | 2 refills | Status: DC

## 2018-12-16 MED ORDER — ALBUMIN HUMAN 25 % IV SOLN
25.0000 g | INTRAVENOUS | Status: DC | PRN
  Administered 2018-12-16: 25 g via INTRAVENOUS
  Filled 2018-12-16: qty 200

## 2018-12-16 MED ORDER — HEPARIN SODIUM (PORCINE) 1000 UNIT/ML DIALYSIS
4200.0000 [IU] | INTRAMUSCULAR | Status: DC | PRN
  Administered 2018-12-16: 4200 [IU] via INTRAVENOUS_CENTRAL
  Filled 2018-12-16 (×3): qty 5

## 2018-12-16 MED ORDER — DOXERCALCIFEROL 4 MCG/2ML IV SOLN
1.0000 ug | INTRAVENOUS | Status: DC
  Administered 2018-12-16: 1 ug via INTRAVENOUS
  Filled 2018-12-16: qty 2

## 2018-12-16 MED ORDER — CHLORHEXIDINE GLUCONATE CLOTH 2 % EX PADS
6.0000 | MEDICATED_PAD | Freq: Every day | CUTANEOUS | Status: DC
  Administered 2018-12-17: 6 via TOPICAL

## 2018-12-16 MED ORDER — MOMETASONE FURO-FORMOTEROL FUM 100-5 MCG/ACT IN AERO
2.0000 | INHALATION_SPRAY | Freq: Every day | RESPIRATORY_TRACT | Status: DC
  Administered 2018-12-17: 2 via RESPIRATORY_TRACT
  Filled 2018-12-16: qty 8.8

## 2018-12-16 NOTE — Progress Notes (Signed)
Inpatient Diabetes Program Recommendations  AACE/ADA: New Consensus Statement on Inpatient Glycemic Control (2015)  Target Ranges:  Prepandial:   less than 140 mg/dL      Peak postprandial:   less than 180 mg/dL (1-2 hours)      Critically ill patients:  140 - 180 mg/dL   Lab Results  Component Value Date   GLUCAP 95 12/16/2018   HGBA1C 8.6 (H) 10/17/2018    Review of Glycemic Control Results for Steven [name], Steven [name] (MRN [id number]) as of 12/16/2018 12:49  Ref. Range 12/15/2018 13:51 12/15/2018 18:13 12/15/2018 21:19 12/16/2018 07:43 12/16/2018 10:25  Glucose-Capillary Latest Ref Range: 70 - 99 mg/dL 416 (H) 338 (H) 196 (H) 56 (L) 95   Diabetes history: DM2 Outpatient Diabetes medications: Levemir 20 units qd + Glucotrol 5 mg qd Current orders for Inpatient glycemic control: Levemir 20 units + Novolog moderate correction tid  Inpatient Diabetes Program Recommendations:   A1c decreased from 9.2 06/19/18 to 8.6 10/17/18 and DM coordinator spoke with patient 06/20/18 regarding elevated A1c. Noted hypoglycemia -Decrease Novolog correction to sensitive tid -Decrease Levemir to 15 units qd  Thank you, Nani Gasser. Steven Barkan, RN, MSN, CDE  Diabetes Coordinator Inpatient Glycemic Control Team Team Pager [phone] (8am-5pm) 12/16/2018 12:55 PM

## 2018-12-16 NOTE — Care Management (Signed)
CM consult for needing PCP list. List provided by discharging RN.
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
## 2018-12-16 NOTE — Procedures (Signed)
     HEMODIALYSIS TREATMENT NOTE (Tue 10 Mar):  Catheter was occluded pre-dialysis.  Alteplase was instilled in both ports x 60 minutes.  Despite both limbs aspirating and flushing without resistance max Qb 350.  Pt reports, 'This is the third catheter I've had--they keep clotting off.  My fistula is clotted, too."    3.5 hour heparin-free dialysis session completed via left IJ tunneled catheter. Exit site unremarkable. Goal met: 1.5 L removed without interruption in ultrafiltration.  Albumin 25g administered x1 for SBP 97.  Treatment was interrupted x15 minutes at pt's request for use of restroom. All blood was returned.  Rockwell [place], RN

## 2018-12-16 NOTE — Progress Notes (Signed)
    NEPHROLOGY NURSING NOTE:  Dr. Pearson Grippe was notified of need for renal service consultation.  Outpatient hemodialysis records obtained from [place]: TTS x 4 hours Gambro Revaclear 400 2K / 2.5Ca EDW 121 kg Max UF 1.5 L/hr Left IJ tc; has arterializing L AVF Qb 400 / Qd 800 Heparin 3000u load; 2250u/h  Epogen 8000u tiw Hectorol 74mcg tiw  HBsAg NEG 12/09/18   Rockwell [place], RN

## 2018-12-16 NOTE — Progress Notes (Signed)
Pt up to side of bed for lunch, poor appetite. Encouraged pt to order veggies if that is his preference. No distress noted. Informed of dialysis today. Will continue to monitor

## 2018-12-16 NOTE — Progress Notes (Signed)
Nutrition Brief Note  After RD saw and spoke to pt, his D/C summary was posted. For documentation purposes, what follows is a summary of our conversation.   Patient had been identified on the Malnutrition Screening Tool (MST) Report. RD met with patient during his infusion. He reports a chronic poor appetite, which he believes started 6-8 months after he first began dialysis a couple years ago. He denies having any frank n/v/c/d, rather he just feels sick when he goes to take a bite of food. He says he will "try to eat, but cant".    Regarding his dietary behaviors, he says he is semi-compliant with the renal diet and semi-compliant with his Phos binders. He says he only takes them if he is eating foods that contain phosphorus. He does not regularly drink oral supplements, but does frequently consume Nepro while he is undergoing dialysis. He says he does not take a Renavit and says this is d/t cost  He says he has lost over 100 lbs since starting HD. He reports having a UBW of 375 prior to starting and his current dry weight is 121 kg or (~265 lbs). Review of chart records does suggest significant wt loss. Pt is documented as being ~290-300 lbs at this time last year and 270-280 lbs this past fall; he has now been admitted at 252 lbs.   RD provided patient with some dietary guidance in setting of his poor appetite and diet intolerance. Emphasized the need to eat small amounts every few hours, even if he is not hungry. RD felt much of his poor appetite is related to his ESRD in general and worsened by a disorganized eating pattern. RD stressed need for protein and recommended oral supplements if he continues to have a poor intake.   He asked for a list of local PCPs accepting new patients. RD consulted case management. Pt to d/c after hd. No nutrition interventions warranted at this time. If nutrition issues arise, please consult RD.   Burtis Junes RD, LDN, CNSC Clinical Nutrition Available Tues-Sat  via Pager: [phone] 12/16/2018 4:14 PM
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
## 2018-12-17 LAB — GLUCOSE, CAPILLARY: Glucose-Capillary: 112 mg/dL — ABNORMAL HIGH (ref 70–99)

## 2018-12-17 MED ORDER — RENA-VITE PO TABS
1.0000 | ORAL_TABLET | Freq: Every day | ORAL | Status: DC
  Administered 2018-12-17: 1 via ORAL
  Filled 2018-12-17: qty 1

## 2018-12-17 MED ORDER — PRO-STAT SUGAR FREE PO LIQD
30.0000 mL | Freq: Every day | ORAL | Status: DC
  Administered 2018-12-17: 30 mL via ORAL
  Filled 2018-12-17: qty 30

## 2018-12-17 NOTE — Progress Notes (Signed)
12/17/2018 11:22 AM  Pt was seen and examined.  He is stable to discharge home.  Please see DC summary dictated by Dr. Dyann Kief.    Murvin Natal MD

## 2018-12-17 NOTE — Discharge Instructions (Signed)
IMPORTANT INFORMATION: PAY CLOSE ATTENTION   PHYSICIAN DISCHARGE INSTRUCTIONS  Follow with Primary care provider  [name], MD  and other consultants as instructed your Hospitalist Physician  SEEK MEDICAL CARE OR RETURN TO EMERGENCY ROOM IF SYMPTOMS COME BACK, WORSEN OR NEW PROBLEM DEVELOPS.   Please note: You were cared for by a hospitalist during your hospital stay. Every effort will be made to forward records to your primary care provider.  You can request that your primary care provider send for your hospital records if they have not received them.  Once you are discharged, your primary care physician will handle any further medical issues. Please note that NO REFILLS for any discharge medications will be authorized once you are discharged, as it is imperative that you return to your primary care physician (or establish a relationship with a primary care physician if you do not have one) for your post hospital discharge needs so that they can reassess your need for medications and monitor your lab values.  Please get a complete blood count and chemistry panel checked by your Primary MD at your next visit, and again as instructed by your Primary MD.  Get Medicines reviewed and adjusted: Please take all your medications with you for your next visit with your Primary MD  Laboratory/radiological data: Please request your Primary MD to go over all hospital tests and procedure/radiological results at the follow up, please ask your primary care provider to get all Hospital records sent to his/her office.  In some cases, they will be blood work, cultures and biopsy results pending at the time of your discharge. Please request that your primary care provider follow up on these results.  If you are diabetic, please bring your blood sugar readings with you to your follow up appointment with primary care.    Please call and make your follow up appointments as soon as possible.    Also Note  the following: If you experience worsening of your admission symptoms, develop shortness of breath, life threatening emergency, suicidal or homicidal thoughts you must seek medical attention immediately by calling 911 or calling your MD immediately  if symptoms less severe.  You must read complete instructions/literature along with all the possible adverse reactions/side effects for all the Medicines you take and that have been prescribed to you. Take any new Medicines after you have completely understood and accpet all the possible adverse reactions/side effects.   Do not drive when taking Pain medications or sleeping medications (Benzodiazepines)  Do not take more than prescribed Pain, Sleep and Anxiety Medications. It is not advisable to combine anxiety,sleep and pain medications without talking with your primary care practitioner  Special Instructions: If you have smoked or chewed Tobacco  in the last 2 yrs please stop smoking, stop any regular Alcohol  and or any Recreational drug use.  Wear Seat belts while driving.

## 2018-12-17 NOTE — Progress Notes (Signed)
Patient received discharge instructions. Patient verbalized understanding. One peripheral IV removed prior to discharge.
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
## 2018-12-18 ENCOUNTER — Other Ambulatory Visit: Payer: Self-pay

## 2018-12-18 ENCOUNTER — Encounter (INDEPENDENT_AMBULATORY_CARE_PROVIDER_SITE_OTHER): Payer: Self-pay | Admitting: Vascular Surgery

## 2018-12-18 ENCOUNTER — Ambulatory Visit (INDEPENDENT_AMBULATORY_CARE_PROVIDER_SITE_OTHER): Payer: Medicare Other | Admitting: Vascular Surgery

## 2018-12-18 VITALS — BP 100/71 | HR 99 | Resp 14 | Ht 71.0 in | Wt 244.0 lb

## 2018-12-18 DIAGNOSIS — I120 Hypertensive chronic kidney disease with stage 5 chronic kidney disease or end stage renal disease: Secondary | ICD-10-CM

## 2018-12-18 DIAGNOSIS — E669 Obesity, unspecified: Secondary | ICD-10-CM | POA: Diagnosis not present

## 2018-12-18 DIAGNOSIS — Z79899 Other long term (current) drug therapy: Secondary | ICD-10-CM | POA: Diagnosis not present

## 2018-12-18 DIAGNOSIS — Z992 Dependence on renal dialysis: Secondary | ICD-10-CM | POA: Diagnosis not present

## 2018-12-18 DIAGNOSIS — I10 Essential (primary) hypertension: Secondary | ICD-10-CM

## 2018-12-18 DIAGNOSIS — T8090XA Unspecified complication following infusion and therapeutic injection, initial encounter: Secondary | ICD-10-CM | POA: Diagnosis not present

## 2018-12-18 DIAGNOSIS — E1169 Type 2 diabetes mellitus with other specified complication: Secondary | ICD-10-CM

## 2018-12-18 DIAGNOSIS — N186 End stage renal disease: Secondary | ICD-10-CM | POA: Diagnosis not present

## 2018-12-18 DIAGNOSIS — K219 Gastro-esophageal reflux disease without esophagitis: Secondary | ICD-10-CM | POA: Diagnosis not present

## 2018-12-18 DIAGNOSIS — T8090XS Unspecified complication following infusion and therapeutic injection, sequela: Secondary | ICD-10-CM
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
## 2018-12-19 DIAGNOSIS — T82898A Other specified complication of vascular prosthetic devices, implants and grafts, initial encounter: Secondary | ICD-10-CM | POA: Diagnosis not present

## 2018-12-19 DIAGNOSIS — D509 Iron deficiency anemia, unspecified: Secondary | ICD-10-CM | POA: Diagnosis not present

## 2018-12-19 DIAGNOSIS — N2581 Secondary hyperparathyroidism of renal origin: Secondary | ICD-10-CM | POA: Diagnosis not present

## 2018-12-19 DIAGNOSIS — D631 Anemia in chronic kidney disease: Secondary | ICD-10-CM | POA: Diagnosis not present

## 2018-12-19 DIAGNOSIS — N186 End stage renal disease: Secondary | ICD-10-CM | POA: Diagnosis not present

## 2018-12-19 DIAGNOSIS — Z992 Dependence on renal dialysis: Secondary | ICD-10-CM | POA: Diagnosis not present

## 2018-12-19 NOTE — Progress Notes (Deleted)
[facility] Consultation      Assessment and Plan:  Dyspnea on exertion, multifactorial -Multifactorial dyspnea from volume overload, restrictive lung disease from obesity, deconditioning. - We discussed increasing his exercise activity, he is considering buying a stationary bike to do, starting at a slow pace.  I am going to check a 6-minute walk test to see if he requires oxygen with activity/exertion.  Volume overload.  -3+ bilateral lower extremity edema, patient is following up with nephrology, he continues on hemodialysis, remains on diuresis as he continues to make some urine.  Chronic hypoxic respiratory failure.  -Continue oxygen 2 L at night, will check 6-minute walk test to see if he requires daytime oxygen.  Restrictive lung disease.  - No evidence of COPD/emphysema on imaging, will send for pulmonary function test.  Obstructive sleep apnea.  -History of obstructive sleep apnea, was intolerant to CPAP, will re-study. - If positive for sleep apnea we will continue to work with him to see if he can be can use CPAP effectively.  No orders of the defined types were placed in this encounter.  No follow-ups on file.  Date: 12/19/2018  MRN# [id number] Steven [name] 1961/03/10   Steven [name] is a 58 y.o. old male seen in consultation for chief complaint of:    No chief complaint on file.   HPI:  The patient is a 58 year old male with a history of obstructive sleep apnea, end-stage renal disease on hemodialysis.  At last visit was noted that he was diagnosed with OSA several years ago but was intolerant to CPAP and stopped.  The patient was also having dyspnea on exertion with some desats on exertion, and increased physical activity and weight loss was recommended.  Patient had a CT of the abdomen performed on 10/17/2018 as part of work-up for abdominal pain.  Showed a rounded masslike atelectasis seen in the right base.    Desat walk today 10/24/17  at RA at rest; sat was 96% and 74. Walked 180 feet with moderate dyspnea, sat was 86% and HR 86  **CT chest 10/17/2018>> there is a rounded mass which is pleural-based in the right lower lobe lungs are otherwise unremarkable. **CXR 09/18/17; normal lungs.  **Echo 03/03/17; EF 55%  Medication:    Current Outpatient Medications:  .  amitriptyline (ELAVIL) 25 MG tablet, Take 25 mg by mouth at bedtime., Disp: , Rfl:  .  aspirin 325 MG tablet, Take 325 mg by mouth daily., Disp: , Rfl:  .  calcium acetate (PHOSLO) 667 MG capsule, Take 2 capsules (1,334 mg total) by mouth 3 (three) times daily with meals., Disp: 180 capsule, Rfl: 1 .  furosemide (LASIX) 80 MG tablet, Take 80 mg by mouth daily., Disp: , Rfl:  .  gabapentin (NEURONTIN) 300 MG capsule, Take 1 capsule (300 mg total) by mouth daily., Disp: , Rfl:  .  glipiZIDE (GLUCOTROL) 5 MG tablet, Take 5 mg by mouth daily before breakfast. , Disp: , Rfl: 3 .  glucose blood (ACCU-CHEK AVIVA) test strip, Use as instructed.use to check blood sugar up to 3 times a day  Disp: 1 box, Disp: 100 each, Rfl: 11 .  insulin aspart (NOVOLOG) 100 UNIT/ML injection, Inject 12 Units into the skin 3 (three) times daily with meals. (Patient taking differently: Inject 12-20 Units into the skin 3 (three) times daily as needed for high blood sugar. ), Disp: 10 mL, Rfl: 11 .  insulin detemir (LEVEMIR) 100 UNIT/ML injection, Inject 0.2 mLs (20  Units total) into the skin daily., Disp: , Rfl:  .  Insulin Pen Needle 31G X 8 MM MISC, 1 Container by Does not apply route QID. qid--any brand, Disp: 100 each, Rfl: 11 .  Insulin Syringes, Disposable, U-100 1 ML MISC, Use as directed with insulin, Disp: 100 each, Rfl: 3 .  Lancets (ACCU-CHEK MULTICLIX) lancets, Use as instructed.  check blood sugar up to 3 times a day Disp:  1 box, Disp: 100 each, Rfl: 11 .  metoprolol tartrate (LOPRESSOR) 25 MG tablet, Take 0.5 tablets (12.5 mg total) by mouth 2 (two) times daily., Disp: 60 tablet, Rfl:  1 .  midodrine (PROAMATINE) 10 MG tablet, Take 1 tablet (10 mg total) by mouth 2 (two) times daily with a meal. (Patient taking differently: Take 10 mg by mouth daily as needed (for low bp from dialysis). ), Disp: 60 tablet, Rfl: 1 .  mirtazapine (REMERON) 15 MG tablet, Take 15 mg by mouth at bedtime. , Disp: , Rfl: 6 .  mometasone-formoterol (DULERA) 100-5 MCG/ACT AERO, Inhale 2 puffs into the lungs every morning., Disp: 13 g, Rfl: 2 .  multivitamin (RENA-VIT) TABS tablet, Take 1 tablet by mouth at bedtime. (Patient not taking: Reported on 12/18/2018), Disp: 180 tablet, Rfl: 0 .  omeprazole (PRILOSEC OTC) 20 MG tablet, Take 1 tablet (20 mg total) by mouth daily., Disp: 30 tablet, Rfl: 1 .  PROAIR HFA 108 (90 Base) MCG/ACT inhaler, Inhale 2 puffs into the lungs every 4 (four) hours as needed for wheezing or shortness of breath. , Disp: , Rfl: 0 .  saccharomyces boulardii (FLORASTOR) 250 MG capsule, Take 1 capsule (250 mg total) by mouth 2 (two) times daily. (Patient not taking: Reported on 11/03/2018), Disp: 30 capsule, Rfl: 0 .  sodium bicarbonate 650 MG tablet, Take 1 tablet (650 mg total) by mouth 2 (two) times daily., Disp: 60 tablet, Rfl: 1 .  spironolactone (ALDACTONE) 25 MG tablet, Take 25 mg by mouth daily., Disp: , Rfl:    Allergies:  Codeine      LABORATORY PANEL:   CBC Recent Labs  Lab 12/16/18 0510  WBC 2.9*  HGB 9.0*  HCT 29.3*  PLT 113*   ------------------------------------------------------------------------------------------------------------------  Chemistries  Recent Labs  Lab 12/16/18 0510  NA 135  K 2.9*  CL 97*  CO2 27  GLUCOSE 72  BUN 27*  CREATININE 4.91*  CALCIUM 7.9*  MG 1.6*  AST 92*  ALT 52*  ALKPHOS 121  BILITOT 0.7   ------------------------------------------------------------------------------------------------------------------  Cardiac Enzymes Recent Labs  Lab 12/16/18 0510  TROPONINI 0.05*    ------------------------------------------------------------  RADIOLOGY:  No results found.     Thank  you for the consultation and for allowing [facility] to assist in the care of your patient. Our recommendations are noted above.  Please contact us if we can be of further service.  Marda Stalker, M.D., F.C.C.P.  Board Certified in Internal Medicine, Pulmonary Medicine, [place], and Sleep Medicine.  [facility] Office Number: [phone]  12/19/2018

## 2018-12-22 ENCOUNTER — Ambulatory Visit: Payer: PRIVATE HEALTH INSURANCE | Admitting: Internal Medicine

## 2018-12-23 DIAGNOSIS — D509 Iron deficiency anemia, unspecified: Secondary | ICD-10-CM | POA: Diagnosis not present

## 2018-12-23 DIAGNOSIS — Z992 Dependence on renal dialysis: Secondary | ICD-10-CM | POA: Diagnosis not present

## 2018-12-23 DIAGNOSIS — N186 End stage renal disease: Secondary | ICD-10-CM | POA: Diagnosis not present

## 2018-12-23 DIAGNOSIS — N2581 Secondary hyperparathyroidism of renal origin: Secondary | ICD-10-CM | POA: Diagnosis not present

## 2018-12-23 DIAGNOSIS — T82898A Other specified complication of vascular prosthetic devices, implants and grafts, initial encounter: Secondary | ICD-10-CM | POA: Diagnosis not present

## 2018-12-23 DIAGNOSIS — D631 Anemia in chronic kidney disease: Secondary | ICD-10-CM | POA: Diagnosis not present

## 2018-12-24 DIAGNOSIS — D631 Anemia in chronic kidney disease: Secondary | ICD-10-CM | POA: Diagnosis not present

## 2018-12-24 DIAGNOSIS — N2581 Secondary hyperparathyroidism of renal origin: Secondary | ICD-10-CM | POA: Diagnosis not present

## 2018-12-24 DIAGNOSIS — Z992 Dependence on renal dialysis: Secondary | ICD-10-CM | POA: Diagnosis not present

## 2018-12-24 DIAGNOSIS — T8249XA Other complication of vascular dialysis catheter, initial encounter: Secondary | ICD-10-CM | POA: Diagnosis not present

## 2018-12-24 DIAGNOSIS — Z452 Encounter for adjustment and management of vascular access device: Secondary | ICD-10-CM | POA: Diagnosis not present

## 2018-12-24 DIAGNOSIS — N186 End stage renal disease: Secondary | ICD-10-CM | POA: Diagnosis not present

## 2018-12-24 DIAGNOSIS — T82898A Other specified complication of vascular prosthetic devices, implants and grafts, initial encounter: Secondary | ICD-10-CM | POA: Diagnosis not present

## 2018-12-24 DIAGNOSIS — D509 Iron deficiency anemia, unspecified: Secondary | ICD-10-CM | POA: Diagnosis not present

## 2018-12-25 DIAGNOSIS — T82898A Other specified complication of vascular prosthetic devices, implants and grafts, initial encounter: Secondary | ICD-10-CM | POA: Diagnosis not present

## 2018-12-25 DIAGNOSIS — N186 End stage renal disease: Secondary | ICD-10-CM | POA: Diagnosis not present

## 2018-12-25 DIAGNOSIS — D631 Anemia in chronic kidney disease: Secondary | ICD-10-CM | POA: Diagnosis not present

## 2018-12-25 DIAGNOSIS — Z992 Dependence on renal dialysis: Secondary | ICD-10-CM | POA: Diagnosis not present

## 2018-12-25 DIAGNOSIS — D509 Iron deficiency anemia, unspecified: Secondary | ICD-10-CM | POA: Diagnosis not present

## 2018-12-25 DIAGNOSIS — Z1159 Encounter for screening for other viral diseases: Secondary | ICD-10-CM | POA: Diagnosis not present

## 2018-12-25 DIAGNOSIS — N2581 Secondary hyperparathyroidism of renal origin: Secondary | ICD-10-CM | POA: Diagnosis not present

## 2018-12-30 DIAGNOSIS — N2581 Secondary hyperparathyroidism of renal origin: Secondary | ICD-10-CM | POA: Diagnosis not present

## 2018-12-30 DIAGNOSIS — N186 End stage renal disease: Secondary | ICD-10-CM | POA: Diagnosis not present

## 2018-12-30 DIAGNOSIS — D509 Iron deficiency anemia, unspecified: Secondary | ICD-10-CM | POA: Diagnosis not present

## 2018-12-30 DIAGNOSIS — Z992 Dependence on renal dialysis: Secondary | ICD-10-CM | POA: Diagnosis not present

## 2018-12-30 DIAGNOSIS — D631 Anemia in chronic kidney disease: Secondary | ICD-10-CM | POA: Diagnosis not present

## 2018-12-30 DIAGNOSIS — T82898A Other specified complication of vascular prosthetic devices, implants and grafts, initial encounter: Secondary | ICD-10-CM | POA: Diagnosis not present

## 2019-01-01 ENCOUNTER — Telehealth (INDEPENDENT_AMBULATORY_CARE_PROVIDER_SITE_OTHER): Payer: Self-pay | Admitting: Vascular Surgery

## 2019-01-01 DIAGNOSIS — T82898A Other specified complication of vascular prosthetic devices, implants and grafts, initial encounter: Secondary | ICD-10-CM | POA: Diagnosis not present

## 2019-01-01 DIAGNOSIS — N186 End stage renal disease: Secondary | ICD-10-CM | POA: Diagnosis not present

## 2019-01-01 DIAGNOSIS — N2581 Secondary hyperparathyroidism of renal origin: Secondary | ICD-10-CM | POA: Diagnosis not present

## 2019-01-01 DIAGNOSIS — D631 Anemia in chronic kidney disease: Secondary | ICD-10-CM | POA: Diagnosis not present

## 2019-01-01 DIAGNOSIS — Z992 Dependence on renal dialysis: Secondary | ICD-10-CM | POA: Diagnosis not present

## 2019-01-01 DIAGNOSIS — D509 Iron deficiency anemia, unspecified: Secondary | ICD-10-CM | POA: Diagnosis not present

## 2019-01-01 NOTE — Telephone Encounter (Signed)
Nakita from [facility] in [place] calling stating that patient has L chest wall CVC placed that's not running properly. Requesting we schedule for CVC change out.   Nakita [phone]

## 2019-01-01 NOTE — Telephone Encounter (Signed)
Steven [name] from [facility] was called regarding the patient and getting him scheduled. The patient could have been scheduled for tomorrow with Dr. Delana Meyer, unfortunately he is unable to get to the appt due to transportation issues. Steven [name] stated they would attempt another way to get the patient taken care of.

## 2019-01-06 DIAGNOSIS — Z992 Dependence on renal dialysis: Secondary | ICD-10-CM | POA: Diagnosis not present

## 2019-01-06 DIAGNOSIS — I871 Compression of vein: Secondary | ICD-10-CM | POA: Diagnosis not present

## 2019-01-06 DIAGNOSIS — T8249XA Other complication of vascular dialysis catheter, initial encounter: Secondary | ICD-10-CM | POA: Diagnosis not present

## 2019-01-06 DIAGNOSIS — N186 End stage renal disease: Secondary | ICD-10-CM | POA: Diagnosis not present

## 2019-01-07 DIAGNOSIS — D509 Iron deficiency anemia, unspecified: Secondary | ICD-10-CM | POA: Diagnosis not present

## 2019-01-07 DIAGNOSIS — D631 Anemia in chronic kidney disease: Secondary | ICD-10-CM | POA: Diagnosis not present

## 2019-01-07 DIAGNOSIS — N186 End stage renal disease: Secondary | ICD-10-CM | POA: Diagnosis not present

## 2019-01-07 DIAGNOSIS — N2581 Secondary hyperparathyroidism of renal origin: Secondary | ICD-10-CM | POA: Diagnosis not present

## 2019-01-07 DIAGNOSIS — Z992 Dependence on renal dialysis: Secondary | ICD-10-CM | POA: Diagnosis not present

## 2019-01-12 ENCOUNTER — Telehealth (INDEPENDENT_AMBULATORY_CARE_PROVIDER_SITE_OTHER): Payer: Self-pay

## 2019-01-12 DIAGNOSIS — Z992 Dependence on renal dialysis: Secondary | ICD-10-CM | POA: Diagnosis not present

## 2019-01-12 DIAGNOSIS — N2581 Secondary hyperparathyroidism of renal origin: Secondary | ICD-10-CM | POA: Diagnosis not present

## 2019-01-12 DIAGNOSIS — D509 Iron deficiency anemia, unspecified: Secondary | ICD-10-CM | POA: Diagnosis not present

## 2019-01-12 DIAGNOSIS — D631 Anemia in chronic kidney disease: Secondary | ICD-10-CM | POA: Diagnosis not present

## 2019-01-12 DIAGNOSIS — N186 End stage renal disease: Secondary | ICD-10-CM | POA: Diagnosis not present

## 2019-01-12 NOTE — Telephone Encounter (Signed)
I called and spoke with Nira Conn at [facility] and let her know about what I was told by Mardene Celeste at [facility]. Nira Conn stated she would let Dr. Candiss Norse know.

## 2019-01-12 NOTE — Telephone Encounter (Signed)
I called and spoke with Mardene Celeste at [facility] regarding the patient being scheduled for cardiac clearance. Per Mardene Celeste the patient has been rescheduled 5 times and they were not going to reschedule the patient.

## 2019-01-14 ENCOUNTER — Ambulatory Visit (INDEPENDENT_AMBULATORY_CARE_PROVIDER_SITE_OTHER): Payer: Medicare Other | Admitting: Nurse Practitioner

## 2019-01-14 ENCOUNTER — Encounter (INDEPENDENT_AMBULATORY_CARE_PROVIDER_SITE_OTHER): Payer: Medicare Other

## 2019-01-14 DIAGNOSIS — D631 Anemia in chronic kidney disease: Secondary | ICD-10-CM | POA: Diagnosis not present

## 2019-01-14 DIAGNOSIS — N186 End stage renal disease: Secondary | ICD-10-CM | POA: Diagnosis not present

## 2019-01-14 DIAGNOSIS — N2581 Secondary hyperparathyroidism of renal origin: Secondary | ICD-10-CM | POA: Diagnosis not present

## 2019-01-14 DIAGNOSIS — D509 Iron deficiency anemia, unspecified: Secondary | ICD-10-CM | POA: Diagnosis not present

## 2019-01-14 DIAGNOSIS — Z992 Dependence on renal dialysis: Secondary | ICD-10-CM | POA: Diagnosis not present

## 2019-01-16 DIAGNOSIS — Z992 Dependence on renal dialysis: Secondary | ICD-10-CM | POA: Diagnosis not present

## 2019-01-16 DIAGNOSIS — N2581 Secondary hyperparathyroidism of renal origin: Secondary | ICD-10-CM | POA: Diagnosis not present

## 2019-01-16 DIAGNOSIS — D631 Anemia in chronic kidney disease: Secondary | ICD-10-CM | POA: Diagnosis not present

## 2019-01-16 DIAGNOSIS — N186 End stage renal disease: Secondary | ICD-10-CM | POA: Diagnosis not present

## 2019-01-16 DIAGNOSIS — D509 Iron deficiency anemia, unspecified: Secondary | ICD-10-CM | POA: Diagnosis not present

## 2019-01-21 DIAGNOSIS — N186 End stage renal disease: Secondary | ICD-10-CM | POA: Diagnosis not present

## 2019-01-21 DIAGNOSIS — N2581 Secondary hyperparathyroidism of renal origin: Secondary | ICD-10-CM | POA: Diagnosis not present

## 2019-01-21 DIAGNOSIS — D631 Anemia in chronic kidney disease: Secondary | ICD-10-CM | POA: Diagnosis not present

## 2019-01-21 DIAGNOSIS — D509 Iron deficiency anemia, unspecified: Secondary | ICD-10-CM | POA: Diagnosis not present

## 2019-01-21 DIAGNOSIS — Z992 Dependence on renal dialysis: Secondary | ICD-10-CM | POA: Diagnosis not present

## 2019-01-21 DIAGNOSIS — Z79899 Other long term (current) drug therapy: Secondary | ICD-10-CM | POA: Diagnosis not present

## 2019-01-22 DIAGNOSIS — I871 Compression of vein: Secondary | ICD-10-CM | POA: Diagnosis not present

## 2019-01-22 DIAGNOSIS — T8249XA Other complication of vascular dialysis catheter, initial encounter: Secondary | ICD-10-CM | POA: Diagnosis not present

## 2019-01-22 DIAGNOSIS — N186 End stage renal disease: Secondary | ICD-10-CM | POA: Diagnosis not present

## 2019-01-22 DIAGNOSIS — Z992 Dependence on renal dialysis: Secondary | ICD-10-CM | POA: Diagnosis not present

## 2019-01-23 ENCOUNTER — Ambulatory Visit: Admit: 2019-01-23 | Discharge: 2019-01-23 | Disposition: A | Discharge status: Home / Self Care | Payer: Medicare Other

## 2019-01-23 ENCOUNTER — Other Ambulatory Visit: Payer: Self-pay

## 2019-01-23 ENCOUNTER — Emergency Department: Payer: Medicare Other

## 2019-01-23 ENCOUNTER — Encounter: Payer: Self-pay | Admitting: Emergency Medicine

## 2019-01-23 ENCOUNTER — Emergency Department
Admission: EM | Admit: 2019-01-23 | Discharge: 2019-01-23 | Disposition: A | Discharge status: Home / Self Care | Payer: Medicare Other | Source: Home / Self Care | Attending: Student in an Organized Health Care Education/Training Program | Admitting: Student in an Organized Health Care Education/Training Program

## 2019-01-23 DIAGNOSIS — R069 Unspecified abnormalities of breathing: Secondary | ICD-10-CM | POA: Diagnosis not present

## 2019-01-23 DIAGNOSIS — Z794 Long term (current) use of insulin: Secondary | ICD-10-CM | POA: Insufficient documentation

## 2019-01-23 DIAGNOSIS — Z992 Dependence on renal dialysis: Principal | ICD-10-CM

## 2019-01-23 DIAGNOSIS — E872 Acidosis, unspecified: Secondary | ICD-10-CM

## 2019-01-23 DIAGNOSIS — E1122 Type 2 diabetes mellitus with diabetic chronic kidney disease: Secondary | ICD-10-CM | POA: Insufficient documentation

## 2019-01-23 DIAGNOSIS — N186 End stage renal disease: Secondary | ICD-10-CM | POA: Insufficient documentation

## 2019-01-23 DIAGNOSIS — I120 Hypertensive chronic kidney disease with stage 5 chronic kidney disease or end stage renal disease: Secondary | ICD-10-CM | POA: Insufficient documentation

## 2019-01-23 DIAGNOSIS — E871 Hypo-osmolality and hyponatremia: Secondary | ICD-10-CM | POA: Insufficient documentation

## 2019-01-23 DIAGNOSIS — N189 Chronic kidney disease, unspecified: Secondary | ICD-10-CM | POA: Diagnosis not present

## 2019-01-23 DIAGNOSIS — F17220 Nicotine dependence, chewing tobacco, uncomplicated: Secondary | ICD-10-CM | POA: Insufficient documentation

## 2019-01-23 DIAGNOSIS — I871 Compression of vein: Secondary | ICD-10-CM | POA: Diagnosis not present

## 2019-01-23 DIAGNOSIS — Z7982 Long term (current) use of aspirin: Secondary | ICD-10-CM

## 2019-01-23 DIAGNOSIS — R609 Edema, unspecified: Secondary | ICD-10-CM | POA: Diagnosis not present

## 2019-01-23 DIAGNOSIS — Z4901 Encounter for fitting and adjustment of extracorporeal dialysis catheter: Secondary | ICD-10-CM | POA: Diagnosis not present

## 2019-01-23 DIAGNOSIS — N2581 Secondary hyperparathyroidism of renal origin: Secondary | ICD-10-CM | POA: Diagnosis not present

## 2019-01-23 DIAGNOSIS — I82210 Acute embolism and thrombosis of superior vena cava: Secondary | ICD-10-CM | POA: Diagnosis not present

## 2019-01-23 DIAGNOSIS — T82868A Thrombosis of vascular prosthetic devices, implants and grafts, initial encounter: Secondary | ICD-10-CM | POA: Diagnosis not present

## 2019-01-23 DIAGNOSIS — Z79899 Other long term (current) drug therapy: Secondary | ICD-10-CM | POA: Insufficient documentation

## 2019-01-23 DIAGNOSIS — R918 Other nonspecific abnormal finding of lung field: Secondary | ICD-10-CM | POA: Diagnosis not present

## 2019-01-23 DIAGNOSIS — D631 Anemia in chronic kidney disease: Secondary | ICD-10-CM | POA: Diagnosis not present

## 2019-01-23 DIAGNOSIS — D509 Iron deficiency anemia, unspecified: Secondary | ICD-10-CM | POA: Diagnosis not present

## 2019-01-23 LAB — CBC WITH DIFFERENTIAL/PLATELET
Abs Immature Granulocytes: 0.01 10*3/uL (ref 0.00–0.07)
Basophils Absolute: 0 10*3/uL (ref 0.0–0.1)
Basophils Relative: 1 %
Eosinophils Absolute: 0 10*3/uL (ref 0.0–0.5)
Eosinophils Relative: 1 %
HCT: 32.2 % — ABNORMAL LOW (ref 39.0–52.0)
Hemoglobin: 10.8 g/dL — ABNORMAL LOW (ref 13.0–17.0)
Immature Granulocytes: 0 %
Lymphocytes Relative: 32 %
Lymphs Abs: 1.2 10*3/uL (ref 0.7–4.0)
MCH: 30.3 pg (ref 26.0–34.0)
MCHC: 33.5 g/dL (ref 30.0–36.0)
MCV: 90.4 fL (ref 80.0–100.0)
Monocytes Absolute: 0.3 10*3/uL (ref 0.1–1.0)
Monocytes Relative: 9 %
Neutro Abs: 2.1 10*3/uL (ref 1.7–7.7)
Neutrophils Relative %: 57 %
Platelets: 135 10*3/uL — ABNORMAL LOW (ref 150–400)
RBC: 3.56 MIL/uL — ABNORMAL LOW (ref 4.22–5.81)
RDW: 14.7 % (ref 11.5–15.5)
WBC: 3.6 10*3/uL — ABNORMAL LOW (ref 4.0–10.5)
nRBC: 0 % (ref 0.0–0.2)

## 2019-01-23 LAB — BASIC METABOLIC PANEL
Anion gap: 18 — ABNORMAL HIGH (ref 5–15)
BUN: 76 mg/dL — ABNORMAL HIGH (ref 6–20)
CO2: 16 mmol/L — ABNORMAL LOW (ref 22–32)
Calcium: 8.5 mg/dL — ABNORMAL LOW (ref 8.9–10.3)
Chloride: 87 mmol/L — ABNORMAL LOW (ref 98–111)
Creatinine, Ser: 7.15 mg/dL — ABNORMAL HIGH (ref 0.61–1.24)
GFR calc Af Amer: 9 mL/min — ABNORMAL LOW (ref >60)
GFR calc non Af Amer: 8 mL/min — ABNORMAL LOW (ref >60)
Glucose, Bld: 227 mg/dL — ABNORMAL HIGH (ref 70–99)
Potassium: 5.9 mmol/L — ABNORMAL HIGH (ref 3.5–5.1)
Sodium: 121 mmol/L — ABNORMAL LOW (ref 135–145)

## 2019-01-23 MED ORDER — SODIUM CHLORIDE 0.9 % IV SOLN
5.0000 mg | Freq: Once | INTRAVENOUS | Status: AC
  Administered 2019-01-23: 5 mg via INTRAVENOUS
  Filled 2019-01-23 (×2): qty 5

## 2019-01-23 MED ORDER — HEPARIN SODIUM (PORCINE) 5000 UNIT/ML IJ SOLN
INTRAMUSCULAR | Status: AC
  Filled 2019-01-23: qty 2

## 2019-01-23 MED ORDER — CHLORHEXIDINE GLUCONATE CLOTH 2 % EX PADS
6.0000 | MEDICATED_PAD | Freq: Every day | CUTANEOUS | Status: DC
  Filled 2019-01-23: qty 6

## 2019-01-23 NOTE — Progress Notes (Signed)
[facility] Procedure Note    TPA infused into both Permcath ports for approximately two hours. At conclusion of procedure both ports flushed easily  Heparin placed into both ports. Patient tolerated the procedure without issue  Discussed with Dr. Ellis Parents [employer] PA-C 01/23/2019 3:10 PM

## 2019-01-23 NOTE — Progress Notes (Signed)
TPA infusion started to Right upper chest Port A Cath dual lumen sites. Both sites flushed easily before infusion started.

## 2019-01-23 NOTE — ED Notes (Signed)
Pt presents to ED via POV, states is a MWF dialysis patient. Pt states last dialysis last Friday. Pt states yesterday had new dialysis catheter placed to R upper chest after previous one stopped working. Pt states went to dialysis today and was unable to get dialysis. This RN spoke with RN from [facility], states was unable to get good blood flow from catheter. Pt is alert and oriented. Denies CP, SOB, or general malaise. Pt states still makes urine. Will continue to monitor for further patient needs.

## 2019-01-23 NOTE — Progress Notes (Signed)
Alteplase gtt. DC'd by K. Stegmayer, Dane with Dr. Lucky Cowboy. Both sites flushed with NS 10 ml flush & Heparin flush 2 ml bilat. Ports. Both permcath ports flushed easily with blood return x 2. Pt. Tolerated well. Permcath ports wrapped in sterile 4x4's. No hematoma, edema, ecchymosis or hematoma at site.

## 2019-01-23 NOTE — ED Triage Notes (Signed)
Pt reports had new vascular access port placed yesterday am and it is still not working. Pt reports he went to dialysis this am and they were unable to access. Pt reports this cath was placed in the same location as the last one and thinks that may the problem.
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
## 2019-01-26 ENCOUNTER — Emergency Department: Payer: Medicare Other

## 2019-01-26 ENCOUNTER — Other Ambulatory Visit: Payer: Self-pay

## 2019-01-26 ENCOUNTER — Encounter
Admission: EM | Disposition: A | Discharge status: Home / Self Care | Payer: Self-pay | Source: Home / Self Care | Attending: Internal Medicine

## 2019-01-26 ENCOUNTER — Inpatient Hospital Stay
Admission: EM | Admit: 2019-01-26 | Discharge: 2019-01-31 | DRG: 252 | Disposition: A | Discharge status: Home / Self Care | Payer: Medicare Other | Attending: Internal Medicine | Admitting: Internal Medicine

## 2019-01-26 ENCOUNTER — Encounter: Payer: Self-pay | Admitting: Emergency Medicine

## 2019-01-26 ENCOUNTER — Other Ambulatory Visit (INDEPENDENT_AMBULATORY_CARE_PROVIDER_SITE_OTHER): Payer: Self-pay | Admitting: Vascular Surgery

## 2019-01-26 ENCOUNTER — Encounter: Payer: Self-pay | Admitting: Nephrology

## 2019-01-26 DIAGNOSIS — E872 Acidosis: Secondary | ICD-10-CM | POA: Diagnosis present

## 2019-01-26 DIAGNOSIS — T82868A Thrombosis of vascular prosthetic devices, implants and grafts, initial encounter: Principal | ICD-10-CM | POA: Diagnosis present

## 2019-01-26 DIAGNOSIS — Z992 Dependence on renal dialysis: Secondary | ICD-10-CM

## 2019-01-26 DIAGNOSIS — T82818A Embolism of vascular prosthetic devices, implants and grafts, initial encounter: Secondary | ICD-10-CM | POA: Diagnosis not present

## 2019-01-26 DIAGNOSIS — Y712 Prosthetic and other implants, materials and accessory cardiovascular devices associated with adverse incidents: Secondary | ICD-10-CM | POA: Diagnosis present

## 2019-01-26 DIAGNOSIS — E1122 Type 2 diabetes mellitus with diabetic chronic kidney disease: Secondary | ICD-10-CM | POA: Diagnosis not present

## 2019-01-26 DIAGNOSIS — R918 Other nonspecific abnormal finding of lung field: Secondary | ICD-10-CM | POA: Diagnosis present

## 2019-01-26 DIAGNOSIS — I871 Compression of vein: Secondary | ICD-10-CM | POA: Diagnosis present

## 2019-01-26 DIAGNOSIS — K529 Noninfective gastroenteritis and colitis, unspecified: Secondary | ICD-10-CM | POA: Diagnosis present

## 2019-01-26 DIAGNOSIS — E782 Mixed hyperlipidemia: Secondary | ICD-10-CM | POA: Diagnosis present

## 2019-01-26 DIAGNOSIS — I82210 Acute embolism and thrombosis of superior vena cava: Secondary | ICD-10-CM | POA: Diagnosis present

## 2019-01-26 DIAGNOSIS — T8249XA Other complication of vascular dialysis catheter, initial encounter: Secondary | ICD-10-CM

## 2019-01-26 DIAGNOSIS — N2581 Secondary hyperparathyroidism of renal origin: Secondary | ICD-10-CM | POA: Diagnosis present

## 2019-01-26 DIAGNOSIS — Z7951 Long term (current) use of inhaled steroids: Secondary | ICD-10-CM

## 2019-01-26 DIAGNOSIS — N186 End stage renal disease: Secondary | ICD-10-CM | POA: Diagnosis present

## 2019-01-26 DIAGNOSIS — Z9119 Patient's noncompliance with other medical treatment and regimen: Secondary | ICD-10-CM

## 2019-01-26 DIAGNOSIS — K219 Gastro-esophageal reflux disease without esophagitis: Secondary | ICD-10-CM | POA: Diagnosis present

## 2019-01-26 DIAGNOSIS — F17290 Nicotine dependence, other tobacco product, uncomplicated: Secondary | ICD-10-CM

## 2019-01-26 DIAGNOSIS — E875 Hyperkalemia: Secondary | ICD-10-CM | POA: Diagnosis present

## 2019-01-26 DIAGNOSIS — G4733 Obstructive sleep apnea (adult) (pediatric): Secondary | ICD-10-CM | POA: Diagnosis present

## 2019-01-26 DIAGNOSIS — Z794 Long term (current) use of insulin: Secondary | ICD-10-CM

## 2019-01-26 DIAGNOSIS — I959 Hypotension, unspecified: Secondary | ICD-10-CM | POA: Diagnosis not present

## 2019-01-26 DIAGNOSIS — R0602 Shortness of breath: Secondary | ICD-10-CM | POA: Diagnosis not present

## 2019-01-26 DIAGNOSIS — R069 Unspecified abnormalities of breathing: Secondary | ICD-10-CM | POA: Diagnosis not present

## 2019-01-26 DIAGNOSIS — R600 Localized edema: Secondary | ICD-10-CM | POA: Diagnosis not present

## 2019-01-26 DIAGNOSIS — R609 Edema, unspecified: Secondary | ICD-10-CM

## 2019-01-26 DIAGNOSIS — T8090XS Unspecified complication following infusion and therapeutic injection, sequela: Secondary | ICD-10-CM

## 2019-01-26 DIAGNOSIS — D631 Anemia in chronic kidney disease: Secondary | ICD-10-CM

## 2019-01-26 DIAGNOSIS — I120 Hypertensive chronic kidney disease with stage 5 chronic kidney disease or end stage renal disease: Secondary | ICD-10-CM | POA: Diagnosis not present

## 2019-01-26 DIAGNOSIS — Z6837 Body mass index (BMI) 37.0-37.9, adult: Secondary | ICD-10-CM | POA: Diagnosis not present

## 2019-01-26 DIAGNOSIS — Z8249 Family history of ischemic heart disease and other diseases of the circulatory system: Secondary | ICD-10-CM

## 2019-01-26 DIAGNOSIS — T82898A Other specified complication of vascular prosthetic devices, implants and grafts, initial encounter: Secondary | ICD-10-CM | POA: Diagnosis not present

## 2019-01-26 DIAGNOSIS — E119 Type 2 diabetes mellitus without complications: Secondary | ICD-10-CM | POA: Diagnosis not present

## 2019-01-26 DIAGNOSIS — T82818D Embolism of vascular prosthetic devices, implants and grafts, subsequent encounter: Secondary | ICD-10-CM | POA: Diagnosis not present

## 2019-01-26 DIAGNOSIS — I10 Essential (primary) hypertension: Secondary | ICD-10-CM | POA: Diagnosis not present

## 2019-01-26 DIAGNOSIS — E7800 Pure hypercholesterolemia, unspecified: Secondary | ICD-10-CM | POA: Diagnosis not present

## 2019-01-26 DIAGNOSIS — N185 Chronic kidney disease, stage 5: Secondary | ICD-10-CM | POA: Diagnosis not present

## 2019-01-26 DIAGNOSIS — E1140 Type 2 diabetes mellitus with diabetic neuropathy, unspecified: Secondary | ICD-10-CM | POA: Diagnosis present

## 2019-01-26 DIAGNOSIS — Z885 Allergy status to narcotic agent status: Secondary | ICD-10-CM

## 2019-01-26 DIAGNOSIS — T8090XA Unspecified complication following infusion and therapeutic injection, initial encounter: Secondary | ICD-10-CM | POA: Insufficient documentation

## 2019-01-26 DIAGNOSIS — T82590A Other mechanical complication of surgically created arteriovenous fistula, initial encounter: Secondary | ICD-10-CM | POA: Diagnosis present

## 2019-01-26 DIAGNOSIS — E11319 Type 2 diabetes mellitus with unspecified diabetic retinopathy without macular edema: Secondary | ICD-10-CM | POA: Diagnosis present

## 2019-01-26 DIAGNOSIS — E871 Hypo-osmolality and hyponatremia: Secondary | ICD-10-CM | POA: Diagnosis not present

## 2019-01-26 DIAGNOSIS — N189 Chronic kidney disease, unspecified: Secondary | ICD-10-CM | POA: Diagnosis not present

## 2019-01-26 DIAGNOSIS — Z79899 Other long term (current) drug therapy: Secondary | ICD-10-CM

## 2019-01-26 DIAGNOSIS — Z7982 Long term (current) use of aspirin: Secondary | ICD-10-CM

## 2019-01-26 HISTORY — PX: DIALYSIS/PERMA CATHETER INSERTION: CATH118288

## 2019-01-26 LAB — GLUCOSE, CAPILLARY
Glucose-Capillary: 158 mg/dL — ABNORMAL HIGH (ref 70–99)
Glucose-Capillary: 242 mg/dL — ABNORMAL HIGH (ref 70–99)

## 2019-01-26 LAB — CBC WITH DIFFERENTIAL/PLATELET
Abs Immature Granulocytes: 0.01 10*3/uL (ref 0.00–0.07)
Basophils Absolute: 0.1 10*3/uL (ref 0.0–0.1)
Basophils Relative: 2 %
Eosinophils Absolute: 0 10*3/uL (ref 0.0–0.5)
Eosinophils Relative: 1 %
HCT: 35.5 % — ABNORMAL LOW (ref 39.0–52.0)
Hemoglobin: 11.9 g/dL — ABNORMAL LOW (ref 13.0–17.0)
Immature Granulocytes: 0 %
Lymphocytes Relative: 30 %
Lymphs Abs: 0.9 10*3/uL (ref 0.7–4.0)
MCH: 30.4 pg (ref 26.0–34.0)
MCHC: 33.5 g/dL (ref 30.0–36.0)
MCV: 90.6 fL (ref 80.0–100.0)
Monocytes Absolute: 0.3 10*3/uL (ref 0.1–1.0)
Monocytes Relative: 9 %
Neutro Abs: 1.8 10*3/uL (ref 1.7–7.7)
Neutrophils Relative %: 58 %
Platelets: 175 10*3/uL (ref 150–400)
RBC: 3.92 MIL/uL — ABNORMAL LOW (ref 4.22–5.81)
RDW: 15.1 % (ref 11.5–15.5)
WBC: 3.1 10*3/uL — ABNORMAL LOW (ref 4.0–10.5)
nRBC: 0 % (ref 0.0–0.2)

## 2019-01-26 LAB — PROTIME-INR
INR: 0.9 (ref 0.8–1.2)
Prothrombin Time: 12.5 seconds (ref 11.4–15.2)

## 2019-01-26 LAB — BASIC METABOLIC PANEL
Anion gap: 21 — ABNORMAL HIGH (ref 5–15)
BUN: 91 mg/dL — ABNORMAL HIGH (ref 6–20)
CO2: 14 mmol/L — ABNORMAL LOW (ref 22–32)
Calcium: 8.9 mg/dL (ref 8.9–10.3)
Chloride: 87 mmol/L — ABNORMAL LOW (ref 98–111)
Creatinine, Ser: 8.43 mg/dL — ABNORMAL HIGH (ref 0.61–1.24)
GFR calc Af Amer: 7 mL/min — ABNORMAL LOW (ref >60)
GFR calc non Af Amer: 6 mL/min — ABNORMAL LOW (ref >60)
Glucose, Bld: 176 mg/dL — ABNORMAL HIGH (ref 70–99)
Potassium: 5.4 mmol/L — ABNORMAL HIGH (ref 3.5–5.1)
Sodium: 122 mmol/L — ABNORMAL LOW (ref 135–145)

## 2019-01-26 LAB — MRSA PCR SCREENING: MRSA by PCR: NEGATIVE

## 2019-01-26 SURGERY — DIALYSIS/PERMA CATHETER INSERTION
Anesthesia: Moderate Sedation

## 2019-01-26 MED ORDER — GABAPENTIN 300 MG PO CAPS
300.0000 mg | ORAL_CAPSULE | Freq: Every day | ORAL | Status: DC
  Administered 2019-01-26 – 2019-01-30 (×5): 300 mg via ORAL
  Filled 2019-01-26 (×5): qty 1

## 2019-01-26 MED ORDER — SODIUM CHLORIDE 0.9% FLUSH
3.0000 mL | Freq: Two times a day (BID) | INTRAVENOUS | Status: DC
  Administered 2019-01-26 – 2019-01-31 (×4): 3 mL via INTRAVENOUS

## 2019-01-26 MED ORDER — HEPARIN SODIUM (PORCINE) 1000 UNIT/ML IJ SOLN
INTRAMUSCULAR | Status: AC
  Filled 2019-01-26: qty 1

## 2019-01-26 MED ORDER — AMITRIPTYLINE HCL 25 MG PO TABS
25.0000 mg | ORAL_TABLET | Freq: Every day | ORAL | Status: DC
  Administered 2019-01-26 – 2019-01-30 (×5): 25 mg via ORAL
  Filled 2019-01-26 (×7): qty 1

## 2019-01-26 MED ORDER — ONDANSETRON HCL 4 MG PO TABS: 4.0000 mg | ORAL_TABLET | Freq: Four times a day (QID) | ORAL | Status: DC | PRN

## 2019-01-26 MED ORDER — ACETAMINOPHEN 325 MG PO TABS
650.0000 mg | ORAL_TABLET | Freq: Four times a day (QID) | ORAL | Status: DC | PRN
  Administered 2019-01-27: 650 mg via ORAL

## 2019-01-26 MED ORDER — METOPROLOL TARTRATE 25 MG PO TABS
12.5000 mg | ORAL_TABLET | Freq: Two times a day (BID) | ORAL | Status: DC
  Administered 2019-01-26 – 2019-01-30 (×8): 12.5 mg via ORAL
  Filled 2019-01-26 (×8): qty 1

## 2019-01-26 MED ORDER — HYDROCODONE-ACETAMINOPHEN 5-325 MG PO TABS
1.0000 | ORAL_TABLET | ORAL | Status: DC | PRN
  Administered 2019-01-31: 2 via ORAL
  Filled 2019-01-26: qty 2

## 2019-01-26 MED ORDER — GLIPIZIDE 5 MG PO TABS
5.0000 mg | ORAL_TABLET | Freq: Every day | ORAL | Status: DC
  Filled 2019-01-26 (×2): qty 1

## 2019-01-26 MED ORDER — CEFAZOLIN SODIUM-DEXTROSE 1-4 GM/50ML-% IV SOLN: 1.0000 g | Freq: Once | INTRAVENOUS | Status: DC

## 2019-01-26 MED ORDER — INSULIN ASPART 100 UNIT/ML ~~LOC~~ SOLN
0.0000 [IU] | Freq: Three times a day (TID) | SUBCUTANEOUS | Status: DC
  Administered 2019-01-27: 5 [IU] via SUBCUTANEOUS
  Administered 2019-01-28: 3 [IU] via SUBCUTANEOUS
  Administered 2019-01-29 (×2): 1 [IU] via SUBCUTANEOUS
  Administered 2019-01-30: 2 [IU] via SUBCUTANEOUS
  Administered 2019-01-30: 1 [IU] via SUBCUTANEOUS
  Administered 2019-01-31: 5 [IU] via SUBCUTANEOUS
  Administered 2019-01-31: 1 [IU] via SUBCUTANEOUS
  Filled 2019-01-26 (×8): qty 1

## 2019-01-26 MED ORDER — MIDODRINE HCL 5 MG PO TABS
10.0000 mg | ORAL_TABLET | Freq: Two times a day (BID) | ORAL | Status: DC
  Administered 2019-01-27 – 2019-01-29 (×6): 10 mg via ORAL
  Filled 2019-01-26 (×11): qty 2

## 2019-01-26 MED ORDER — ONDANSETRON HCL 4 MG/2ML IJ SOLN
4.0000 mg | Freq: Four times a day (QID) | INTRAMUSCULAR | Status: DC | PRN
  Administered 2019-01-30: 4 mg via INTRAVENOUS

## 2019-01-26 MED ORDER — APIXABAN 5 MG PO TABS: 5.0000 mg | ORAL_TABLET | Freq: Two times a day (BID) | ORAL | 0 refills | Status: DC

## 2019-01-26 MED ORDER — APIXABAN 5 MG PO TABS
5.0000 mg | ORAL_TABLET | Freq: Two times a day (BID) | ORAL | Status: DC
  Filled 2019-01-26: qty 1

## 2019-01-26 MED ORDER — HEPARIN (PORCINE) IN NACL 1000-0.9 UT/500ML-% IV SOLN
INTRAVENOUS | Status: AC
  Filled 2019-01-26: qty 500

## 2019-01-26 MED ORDER — SODIUM CHLORIDE 0.9% FLUSH: 3.0000 mL | INTRAVENOUS | Status: DC | PRN

## 2019-01-26 MED ORDER — MIRTAZAPINE 15 MG PO TABS
15.0000 mg | ORAL_TABLET | Freq: Every day | ORAL | Status: DC
  Administered 2019-01-26 – 2019-01-30 (×5): 15 mg via ORAL
  Filled 2019-01-26 (×5): qty 1

## 2019-01-26 MED ORDER — FENTANYL CITRATE (PF) 100 MCG/2ML IJ SOLN
INTRAMUSCULAR | Status: AC
  Filled 2019-01-26: qty 2

## 2019-01-26 MED ORDER — HEPARIN SODIUM (PORCINE) 5000 UNIT/ML IJ SOLN: 5000.0000 [IU] | Freq: Three times a day (TID) | INTRAMUSCULAR | Status: DC

## 2019-01-26 MED ORDER — CEFAZOLIN SODIUM-DEXTROSE 2-4 GM/100ML-% IV SOLN
2.0000 g | INTRAVENOUS | Status: DC
  Filled 2019-01-26: qty 100

## 2019-01-26 MED ORDER — CEFAZOLIN SODIUM-DEXTROSE 1-4 GM/50ML-% IV SOLN
INTRAVENOUS | Status: AC
  Filled 2019-01-26: qty 50

## 2019-01-26 MED ORDER — SODIUM CHLORIDE 0.9 % IV SOLN
250.0000 mL | INTRAVENOUS | Status: DC | PRN
  Administered 2019-01-30: 12:00:00 via INTRAVENOUS

## 2019-01-26 MED ORDER — ACETAMINOPHEN 650 MG RE SUPP: 650.0000 mg | Freq: Four times a day (QID) | RECTAL | Status: DC | PRN

## 2019-01-26 MED ORDER — CALCIUM ACETATE (PHOS BINDER) 667 MG PO CAPS
1334.0000 mg | ORAL_CAPSULE | Freq: Three times a day (TID) | ORAL | Status: DC
  Administered 2019-01-27 – 2019-01-31 (×8): 1334 mg via ORAL
  Filled 2019-01-26 (×8): qty 2

## 2019-01-26 MED ORDER — HEPARIN SODIUM (PORCINE) 10000 UNIT/ML IJ SOLN
INTRAMUSCULAR | Status: AC
  Filled 2019-01-26: qty 1

## 2019-01-26 MED ORDER — PANTOPRAZOLE SODIUM 40 MG PO TBEC
40.0000 mg | DELAYED_RELEASE_TABLET | Freq: Every day | ORAL | Status: DC
  Administered 2019-01-26 – 2019-01-31 (×5): 40 mg via ORAL
  Filled 2019-01-26 (×5): qty 1

## 2019-01-26 MED ORDER — MIDAZOLAM HCL 2 MG/2ML IJ SOLN
INTRAMUSCULAR | Status: AC
  Filled 2019-01-26: qty 4

## 2019-01-26 MED ORDER — ALBUTEROL SULFATE (2.5 MG/3ML) 0.083% IN NEBU: 3.0000 mL | INHALATION_SOLUTION | RESPIRATORY_TRACT | Status: DC | PRN

## 2019-01-26 MED ORDER — SODIUM CHLORIDE 0.9 % IV SOLN: INTRAVENOUS | Status: DC

## 2019-01-26 MED ORDER — OMEPRAZOLE MAGNESIUM 20 MG PO TBEC: 20.0000 mg | DELAYED_RELEASE_TABLET | Freq: Every day | ORAL | Status: DC

## 2019-01-26 MED ORDER — ALTEPLASE 2 MG IJ SOLR
INTRAMUSCULAR | Status: DC | PRN
  Administered 2019-01-26: 4 mg

## 2019-01-26 MED ORDER — SODIUM BICARBONATE 650 MG PO TABS
650.0000 mg | ORAL_TABLET | Freq: Two times a day (BID) | ORAL | Status: DC
  Administered 2019-01-26 – 2019-01-29 (×5): 650 mg via ORAL
  Filled 2019-01-26 (×5): qty 1

## 2019-01-26 MED ORDER — MIDAZOLAM HCL 2 MG/2ML IJ SOLN
INTRAMUSCULAR | Status: DC | PRN
  Administered 2019-01-26 (×2): 2 mg via INTRAVENOUS

## 2019-01-26 MED ORDER — INSULIN DETEMIR 100 UNIT/ML ~~LOC~~ SOLN
20.0000 [IU] | Freq: Every day | SUBCUTANEOUS | Status: DC
  Administered 2019-01-26 – 2019-01-27 (×2): 20 [IU] via SUBCUTANEOUS
  Filled 2019-01-26 (×3): qty 0.2

## 2019-01-26 MED ORDER — INSULIN ASPART 100 UNIT/ML ~~LOC~~ SOLN
0.0000 [IU] | Freq: Every day | SUBCUTANEOUS | Status: DC
  Administered 2019-01-26: 2 [IU] via SUBCUTANEOUS
  Administered 2019-01-30: 4 [IU] via SUBCUTANEOUS
  Filled 2019-01-26 (×3): qty 1

## 2019-01-26 MED ORDER — INSULIN ASPART 100 UNIT/ML ~~LOC~~ SOLN
12.0000 [IU] | Freq: Three times a day (TID) | SUBCUTANEOUS | Status: DC
  Administered 2019-01-27: 12 [IU] via SUBCUTANEOUS
  Filled 2019-01-26: qty 1

## 2019-01-26 MED ORDER — FENTANYL CITRATE (PF) 100 MCG/2ML IJ SOLN
INTRAMUSCULAR | Status: DC | PRN
  Administered 2019-01-26 (×2): 50 ug via INTRAVENOUS

## 2019-01-26 MED ORDER — CEFAZOLIN SODIUM-DEXTROSE 1-4 GM/50ML-% IV SOLN
INTRAVENOUS | Status: AC
  Administered 2019-01-26: 1000 mg
  Filled 2019-01-26: qty 50

## 2019-01-26 MED ORDER — SENNOSIDES-DOCUSATE SODIUM 8.6-50 MG PO TABS: 1.0000 | ORAL_TABLET | Freq: Every evening | ORAL | Status: DC | PRN

## 2019-01-26 MED ORDER — CHLORHEXIDINE GLUCONATE CLOTH 2 % EX PADS
6.0000 | MEDICATED_PAD | Freq: Every day | CUTANEOUS | Status: DC
  Administered 2019-01-27 – 2019-01-30 (×4): 6 via TOPICAL
  Filled 2019-01-26: qty 6

## 2019-01-26 MED ORDER — LIDOCAINE-EPINEPHRINE (PF) 1 %-1:200000 IJ SOLN
INTRAMUSCULAR | Status: AC
  Filled 2019-01-26: qty 30

## 2019-01-26 MED ORDER — HEPARIN SODIUM (PORCINE) 1000 UNIT/ML IJ SOLN
INTRAMUSCULAR | Status: DC | PRN
  Administered 2019-01-26: 3000 [IU] via INTRAVENOUS

## 2019-01-26 MED ORDER — MOMETASONE FURO-FORMOTEROL FUM 100-5 MCG/ACT IN AERO
2.0000 | INHALATION_SPRAY | RESPIRATORY_TRACT | Status: DC
  Administered 2019-01-27 – 2019-01-31 (×5): 2 via RESPIRATORY_TRACT
  Filled 2019-01-26: qty 8.8

## 2019-01-26 SURGICAL SUPPLY — 13 items
BALLN ATG 12X6X80 (BALLOONS) ×3
BALLN ATG 14X6X80 (BALLOONS) ×3
BALLOON ATG 12X6X80 (BALLOONS) IMPLANT
BALLOON ATG 14X6X80 (BALLOONS) IMPLANT
CANISTER PENUMBRA ENGINE (MISCELLANEOUS) ×2 IMPLANT
CATH INDIGO D 50CM (CATHETERS) ×2 IMPLANT
CATH PALINDROME-SP 14.5FX23 (CATHETERS) ×2 IMPLANT
DEVICE PRESTO INFLATION (MISCELLANEOUS) ×2 IMPLANT
GUIDEWIRE SUPER STIFF .035X180 (WIRE) ×2 IMPLANT
PACK ANGIOGRAPHY (CUSTOM PROCEDURE TRAY) ×2 IMPLANT
SHEATH BRITE TIP 10FRX11 (SHEATH) ×2 IMPLANT
SUT MNCRL AB 4-0 PS2 18 (SUTURE) ×2 IMPLANT
SUT PROLENE 0 CT 1 30 (SUTURE) ×2 IMPLANT

## 2019-01-26 NOTE — ED Notes (Signed)
[facility] came to get pt

## 2019-01-26 NOTE — Progress Notes (Signed)
Pt. Received from Vascular lab. No complications to new [facility] site. Dialysis [facility]) dept. Called-ready to dialyze pt. ER RN Ariel to accept pt. Post Dialysis and DC pt. Home from ER. Per Dr.Dew request, pt. To go to dialysis, then home today.

## 2019-01-26 NOTE — ED Triage Notes (Signed)
Pt states dialysis port not working, has been over a week since he's had dialysis. NAD.

## 2019-01-26 NOTE — Consult Note (Signed)
[facility] Consult Note  MRN : [id number]  Steven [name] is a 58 y.o. (1961-09-07) male who presents with chief complaint of  Chief Complaint  Patient presents with  . Vascular Access Problem  .  History of Present Illness: I am asked to see the patient by Dr. Candiss Norse in nephrology for a persistently nonfunctioning PermCath.  The patient was seen last week and had a TPA infusion performed at which time the lumens withdrew blood well and flushed easily after the TPA infusion.  He went to dialysis this morning and they said they were unable to get the catheter to run well.  He was sent to the emergency department and then we were contacted for this issue.  He has no other complaints.  He feels well today.  Current Facility-Administered Medications  Medication Dose Route Frequency Provider Last Rate Last Dose  . 0.9 %  sodium chloride infusion   Intravenous Continuous Dew, Erskine Squibb, MD      . ceFAZolin (ANCEF) IVPB 2g/100 mL premix  2 g Intravenous 30 min Pre-Op Algernon Huxley, MD        Past Medical History:  Diagnosis Date  . Anemia   . Cellulitis and abscess of right lower extremity 03/01/2017  . Chronic kidney disease    dialysis m,w,f  . Diabetes mellitus without complication ([place])   . Edema extremities 03/01/2017   bilateral swelling  . GERD (gastroesophageal reflux disease)   . High cholesterol   . High triglycerides   . Hypertension   . Neuropathy   . Sleep apnea    can't use cpap d/t feelings of suffocation    Past Surgical History:  Procedure Laterality Date  . A/V FISTULAGRAM Right 08/27/2017   Procedure: A/V FISTULAGRAM;  Surgeon: Katha Cabal, MD;  Location: [facility];  Service: Cardiovascular;  Laterality: Right;  . A/V FISTULAGRAM Right 11/04/2018   Procedure: A/V FISTULAGRAM;  Surgeon: Katha Cabal, MD;  Location: [facility];  Service: Cardiovascular;  Laterality: Right;  . A/V SHUNT INTERVENTION  N/A 06/23/2018   Procedure: A/V SHUNT INTERVENTION;  Surgeon: Algernon Huxley, MD;  Location: [facility];  Service: Cardiovascular;  Laterality: N/A;  . APPENDECTOMY  2010  . AV FISTULA PLACEMENT Right 06/14/2017   Procedure: ARTERIOVENOUS (AV) FISTULA CREATION WRIST;  Surgeon: Katha Cabal, MD;  Location: [facility];  Service: Vascular;  Laterality: Right;  . DIALYSIS/PERMA CATHETER INSERTION N/A 03/05/2017   Procedure: Dialysis/Perma Catheter Insertion;  Surgeon: Katha Cabal, MD;  Location: [facility];  Service: Cardiovascular;  Laterality: N/A;  . DIALYSIS/PERMA CATHETER INSERTION N/A 09/20/2017   Procedure: DIALYSIS/PERMA CATHETER INSERTION;  Surgeon: Katha Cabal, MD;  Location: [place] [facility];  Service: Cardiovascular;  Laterality: N/A;  . EXCHANGE OF A DIALYSIS CATHETER  03/29/2017   Procedure: Exchange Of A Dialysis Catheter;  Surgeon: Katha Cabal, MD;  Location: [facility];  Service: Cardiovascular;;  . IRRIGATION AND DEBRIDEMENT FOOT Right 03/01/2017   Procedure: IRRIGATION AND DEBRIDEMENT FOOT;  Surgeon: Albertine Patricia, DPM;  Location: [facility];  Service: Podiatry;  Laterality: Right;  application of wound vac  . PERIPHERAL VASCULAR THROMBECTOMY Right 06/18/2018   Procedure: PERIPHERAL VASCULAR THROMBECTOMY;  Surgeon: Algernon Huxley, MD;  Location: [facility];  Service: Cardiovascular;  Laterality: Right;    Social History Social History   Tobacco Use  . Smoking status: Never Smoker  .  Smokeless tobacco: Current User    Types: Snuff  Substance Use Topics  . 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 Drug use: No    Family History Family History  Problem Relation Age of Onset  . CAD Father   . Heart disease Father   No bleeding disorders, clotting disorders, or aneurysms  Allergies  Allergen Reactions  . Codeine Nausea And Vomiting     REVIEW OF SYSTEMS (Negative unless  checked)  Constitutional: [] Weight loss  [] Fever  [] Chills Cardiac: [] Chest pain   [] Chest pressure   [] Palpitations   [] Shortness of breath when laying flat   [] Shortness of breath at rest   [x] Shortness of breath with exertion. Vascular:  [] Pain in legs with walking   [] Pain in legs at rest   [] Pain in legs when laying flat   [] Claudication   [] Pain in feet when walking  [] Pain in feet at rest  [] Pain in feet when laying flat   [] History of DVT   [] Phlebitis   [] Swelling in legs   [] Varicose veins   [] Non-healing ulcers Pulmonary:   [] Uses home oxygen   [] Productive cough   [] Hemoptysis   [] Wheeze  [] COPD   [] Asthma Neurologic:  [] Dizziness  [] Blackouts   [] Seizures   [] History of stroke   [] History of TIA  [] Aphasia   [] Temporary blindness   [] Dysphagia   [] Weakness or numbness in arms   [] Weakness or numbness in legs Musculoskeletal:  [x] Arthritis   [] Joint swelling   [x] Joint pain   [] Low back pain Hematologic:  [] Easy bruising  [] Easy bleeding   [] Hypercoagulable state   [x] Anemic  [] Hepatitis Gastrointestinal:  [] Blood in stool   [] Vomiting blood  [] Gastroesophageal reflux/heartburn   [] Difficulty swallowing. Genitourinary:  [x] Chronic kidney disease   [] Difficult urination  [] Frequent urination  [] Burning with urination   [] Blood in urine Skin:  [] Rashes   [] Ulcers   [] Wounds Psychological:  [] History of anxiety   []  History of major depression.  Physical Examination  Vitals:   01/26/19 1026 01/26/19 1027 01/26/19 1415  BP:  140/88 (!) 180/105  Pulse:  100 88  Resp:  20 20  Temp:  (!) 97.5 F (36.4 C) 97.6 F (36.4 C)  TempSrc:  Oral Oral  SpO2:  94%   Weight: 108.9 kg  108.9 kg  Height: 5\' 10"  (1.778 m)  5\' 10"  (1.778 m)   Body mass index is 34.44 kg/m. Gen:  WD/WN, NAD Head: [place]/AT, No temporalis wasting.  Ear/Nose/Throat: Hearing grossly intact, nares w/o erythema or drainage, oropharynx w/o Erythema/Exudate Eyes: Sclera non-icteric, conjunctiva clear Neck: Trachea  midline.  No JVD.  Pulmonary:  Good air movement, respirations not labored, equal bilaterally.  Cardiac: RRR, normal S1, S2. Vascular: Permcath in place exiting under right clavicle with no erythema or drainage. Vessel Right Left  Radial Palpable Palpable                                    Musculoskeletal: M/S 5/5 throughout.  Extremities without ischemic changes.  No deformity or atrophy. No edema. Neurologic: Sensation grossly intact in extremities.  Symmetrical.  Speech is fluent. Motor exam as listed above. Psychiatric: Judgment intact, Mood & affect appropriate for pt's clinical situation. Dermatologic: No rashes or ulcers noted.  No cellulitis or open wounds.       CBC Lab Results  Component Value Date  WBC 3.1 (L) 01/26/2019   HGB 11.9 (L) 01/26/2019   HCT 35.5 (L) 01/26/2019   MCV 90.6 01/26/2019   PLT 175 01/26/2019    BMET    Component Value Date/Time   NA 122 (L) 01/26/2019 1052   K 5.4 (H) 01/26/2019 1052   CL 87 (L) 01/26/2019 1052   CO2 14 (L) 01/26/2019 1052   GLUCOSE 176 (H) 01/26/2019 1052   BUN 91 (H) 01/26/2019 1052   CREATININE 8.43 (H) 01/26/2019 1052   CREATININE 1.41 (H) 12/07/2011 1030   CALCIUM 8.9 01/26/2019 1052   GFRNONAA 6 (L) 01/26/2019 1052   GFRAA 7 (L) 01/26/2019 1052   Estimated Creatinine Clearance: 12 mL/min (A) (by C-G formula based on SCr of 8.43 mg/dL (H)).  COAG Lab Results  Component Value Date   INR 0.9 01/26/2019   INR 0.9 12/16/2018   INR 1.07 05/23/2017    Radiology Dg Chest Portable 1 View  Result Date: 01/26/2019 CLINICAL DATA:  Bilateral lower extremity swelling. EXAM: PORTABLE CHEST 1 VIEW COMPARISON:  Radiograph of January 23, 2019. FINDINGS: The heart size and mediastinal contours are within normal limits. Right internal jugular dialysis catheter is unchanged in position. No pneumothorax is noted. Left lung is clear. Stable right basilar opacity is noted concerning for atelectasis or scarring with  possible associated pleural effusion. The visualized skeletal structures are unremarkable. IMPRESSION: Stable right basilar opacity is noted as described above, concerning for scarring or atelectasis with possible small pleural effusion. No significant changes noted compared to prior exam. Electronically Signed   By: Marijo Conception M.D.   On: 01/26/2019 11:01   Dg Chest Portable 1 View  Result Date: 01/23/2019 CLINICAL DATA:  Failed dialysis today. Hemodialysis catheter placed yesterday. Last hemodialysis 1 week ago per patient. EXAM: PORTABLE CHEST 1 VIEW COMPARISON:  Radiographs 12/15/2018 and 10/17/2018. FINDINGS: 1118 hours. New right IJ dialysis catheter extends to the level of the upper right atrium. The left IJ catheter has been removed. The heart size and mediastinal contours are stable with mild superior mediastinal widening corresponding with fat on prior CT. There is a small right pleural effusion and associated right basilar airspace disease, stable from recent radiographs dating back to January. The left lung is clear. There is no pneumothorax. IMPRESSION: 1. The new right IJ dialysis catheter appears appropriately positioned. No pneumothorax. 2. Persistent right pleural effusion and right basilar airspace disease, not grossly changed over the last 3 months. Electronically Signed   By: Richardean Sale M.D.   On: 01/23/2019 11:43      Assessment/Plan 1. Non-functional Permcath.  This has already had a TPA infusion within the last week but apparently he was sent from dialysis today because the catheter is not working.  This was appropriately positioned on x-ray last week.  At this point, we will exchange the PermCath to try to get a more functional access. 2.  ESRD.  Access issues as above. 3.  Hypertension.  Stable on outpatient medications and blood pressure control important in reducing the progression of atherosclerotic disease. On appropriate oral medications. 4.  DM.  Stable on  outpatient medications and blood glucose control important in reducing the progression of atherosclerotic disease. Also, involved in wound healing. On appropriate medications.    Leotis Pain, MD  01/26/2019 2:16 PM    This note was created with Dragon medical transcription system.  Any error is purely unintentional

## 2019-01-26 NOTE — Op Note (Signed)
OPERATIVE NOTE    PRE-OPERATIVE DIAGNOSIS: 1. ESRD 2. Non-functional permcath  POST-OPERATIVE DIAGNOSIS: same as above  PROCEDURE: 1. Fluoroscopic guidance for placement of catheter 2. Placement of a 23 cm tip to cuff tunneled hemodialysis catheter via the right internal jugular vein and removal of previous catheter 3. Right jugular venogram and superior venacavogram 4. Percutaneous transluminal angioplasty of the superior vena cava with 12 and 14 mm diameter angioplasty balloons 5. Catheter directed thrombolytic therapy with 4 mg of TPA to the superior vena cava 6. Mechanical thrombectomy to the superior vena cava with the penumbra cat D device  SURGEON: Leotis Pain, MD  ANESTHESIA:  Local with moderate conscious sedation for 30 minutes using 4 mg of Versed and 100 mcg of Fentanyl  ESTIMATED BLOOD LOSS: 100 cc  FINDING(S): none  SPECIMEN(S):  None  INDICATIONS:   Patient is a 58 y.o.male who presents with non-functional dialysis catheter and ESRD.  The patient needs long term dialysis access for their ESRD, and a Permcath is necessary.  Risks and benefits are discussed and informed consent is obtained.    DESCRIPTION: After obtaining full informed written consent, the patient was brought back to the vascular suite. The patient received moderate conscious sedation during a face-to-face encounter with me present throughout the entire procedure and supervising the RN monitoring the vital signs, pulse oximetry, telemetry, and mental status throughout the entire procedure. The patient's existing catheter, right neck and chest were sterilely prepped and draped in a sterile surgical field was created.  The existing catheter was dissected free from the fibrous sheath securing the cuff with hemostats and blunt dissection.  A wire was placed. The existing catheter was then removed and the wire used to keep venous access, but prior to removal I elected to image the jugular vein and SVC so the  catheter was pulled back to the clavicle and imaging was performed through the catheter.  This demonstrated stenosis and possible thrombus within the jugular vein at the clavicle as well as the superior vena cava with the right atrium appearing to be patent at that level.  The degree of narrowing was significantly greater than 70% it would appear.  With this, a 10 French sheath was placed over the wire and the patient was given 3000 units of intravenous heparin.  A 12 mm diameter by 6 cm length angioplasty balloon was inflated from the right innominate vein down throughout the superior vena cava and inflated up to 20 atm for 1 minute.  Imaging following this showed significant thrombus and narrowing of greater than 50% and so I elected to perform thrombectomy in this location.  4 mg of TPA were instilled through the sheath to try to help with the thrombotic process.  The penumbra cat D device was brought onto the field and multiple passes with the penumbra cat D device were performed in the right jugular vein and superior vena cava down to the right atrium.  Several chunks of thrombus were removed and could be seen in the canister.  Following this, the flow channel was significantly better with less thrombus present but there remained significant narrowing of over 50%.  I used a 14 mm diameter by 6 cm length angioplasty balloon and inflated from the mid right jugular vein at the clavicle down to the right atrium with 2 inflations encompassing the entire superior vena cava.  The inflations were 18 to 20 atm for a minute.  Completion imaging showed much more brisk flow with narrowing in  the jugular vein above the clavicle and less than 40% residual stenosis in the superior vena cava with non-flow-limiting thrombus present.  The patient will be started on anticoagulation and a new catheter will be placed. I selected a 23 cm tip to cuff tunneled dialysis catheter.  The 10 French sheath was removed keeping the stiff wire  in place.  Using fluoroscopic guidance the catheter tips were parked in the right atrium. The appropriate distal connectors were placed. It withdrew blood well and flushed easily with heparinized saline and a concentrated heparin solution was then placed. It was secured to the chest wall with 2 Prolene sutures. A 4-0 Monocryl pursestring suture was placed around the exit site. Sterile dressings were placed. The patient tolerated the procedure well and was taken to the recovery room in stable condition.  COMPLICATIONS: None  CONDITION: Stable  Leotis Pain 01/26/2019 3:56 PM   This note was created with Dragon Medical transcription system. Any errors in dictation are purely unintentional.

## 2019-01-26 NOTE — Progress Notes (Signed)
Pt. Sister Blanch Media called and made aware of pt. Procedure today(perm cath changeout) and that pt. In Dialysis currently. Asked pt. Sister to pick up pt. Tonight after dialysis (secondary to pt. Having sedation in Vascular lab). Pt. Sister agreeable via phone conversation ( COVID-19 restrictions). Blanch Media [phone].

## 2019-01-26 NOTE — Progress Notes (Signed)
Attempted to administer hemodialysis treatment as prescribed via newly placed right IJ permacath. Catheter not functioning well enough for dialysis delivery. Dr Lucky Cowboy and Dr Candiss Norse made aware via phone. Patient was advised to return on 01/27/19 at 0700 for temporary catheter placement. Patient verbalized understanding and agrees to return for procedure on tomorrow. Released patient back to ED in stable condition.

## 2019-01-26 NOTE — ED Notes (Signed)
Pt reports that he has not had dialysis in over a week - he reports that he was here Friday for same and they "flushed with blood thinner" - today was at dialysis and they were unable to use the port - pt reports increased swelling in bilat ext - report SHOB no worse than his usual - denies dizziness, syncope, N/V , palpitations

## 2019-01-26 NOTE — Progress Notes (Signed)
Advanced care plan. Purpose of the Encounter: CODE STATUS Parties in Attendance: Patient Patient's Decision Capacity: Good Subjective/Patient's story: Steven [name]  is a 58 y.o. male with a known history of  end-stage renal disease on dialysis, diabetes mellitus type 2, GERD, hyperlipidemia, neuropathy, sleep apnea was referred from dialysis center by nephrologist Dr. Candiss Norse.  Patient dialysis catheter was changed at the center but was nonfunctional Objective/Medical story Patient unable to be dialyzed secondary to catheter malfunction.  Needs new access site and catheter placement by vascular surgery for dialysis. Goals of care determination:  Advance care directives goals of care treatment plan discussed. For now patient wants everything done which includes CPR, intubation ventilator the need arises CODE STATUS: Full code Time spent discussing advanced care planning: 16 minutes

## 2019-01-26 NOTE — H&P (Signed)
[facility] NAME: Steven [name]    MR#:  [id number]  DATE OF BIRTH:  January 23, 1961  DATE OF ADMISSION:  01/26/2019  PRIMARY CARE PHYSICIAN: [name], MD   REQUESTING/REFERRING PHYSICIAN:   CHIEF COMPLAINT:   Chief Complaint  Patient presents with  . Vascular Access Problem    HISTORY OF PRESENT ILLNESS: Steven [name]  is a 58 y.o. male with a known history of  end-stage renal disease on dialysis, diabetes mellitus type 2, GERD, hyperlipidemia, neuropathy, sleep apnea was referred from dialysis center by nephrologist Dr. Candiss Norse.  Patient dialysis catheter was changed at the center but was nonfunctional.  Vascular surgery has been contacted and patient will get temporary access for dialysis on the opposite new site.  No complaints of any chest pain, shortness of breath, cough fever and chills.  Hospitalist service was consulted for admission.  PAST MEDICAL HISTORY:   Past Medical History:  Diagnosis Date  . Anemia   . Cellulitis and abscess of right lower extremity 03/01/2017  . Chronic kidney disease    dialysis m,w,f  . Diabetes mellitus without complication ([place])   . Edema extremities 03/01/2017   bilateral swelling  . GERD (gastroesophageal reflux disease)   . High cholesterol   . High triglycerides   . Hypertension   . Neuropathy   . Sleep apnea    can't use cpap d/t feelings of suffocation    PAST SURGICAL HISTORY:  Past Surgical History:  Procedure Laterality Date  . A/V FISTULAGRAM Right 08/27/2017   Procedure: A/V FISTULAGRAM;  Surgeon: Katha Cabal, MD;  Location: [facility];  Service: Cardiovascular;  Laterality: Right;  . A/V FISTULAGRAM Right 11/04/2018   Procedure: A/V FISTULAGRAM;  Surgeon: Katha Cabal, MD;  Location: [facility];  Service: Cardiovascular;  Laterality: Right;  . A/V SHUNT INTERVENTION N/A 06/23/2018   Procedure: A/V SHUNT INTERVENTION;  Surgeon:  Algernon Huxley, MD;  Location: [facility];  Service: Cardiovascular;  Laterality: N/A;  . APPENDECTOMY  2010  . AV FISTULA PLACEMENT Right 06/14/2017   Procedure: ARTERIOVENOUS (AV) FISTULA CREATION WRIST;  Surgeon: Katha Cabal, MD;  Location: [facility];  Service: Vascular;  Laterality: Right;  . DIALYSIS/PERMA CATHETER INSERTION N/A 03/05/2017   Procedure: Dialysis/Perma Catheter Insertion;  Surgeon: Katha Cabal, MD;  Location: [facility];  Service: Cardiovascular;  Laterality: N/A;  . DIALYSIS/PERMA CATHETER INSERTION N/A 09/20/2017   Procedure: DIALYSIS/PERMA CATHETER INSERTION;  Surgeon: Katha Cabal, MD;  Location: [facility];  Service: Cardiovascular;  Laterality: N/A;  . EXCHANGE OF A DIALYSIS CATHETER  03/29/2017   Procedure: Exchange Of A Dialysis Catheter;  Surgeon: Katha Cabal, MD;  Location: [facility];  Service: Cardiovascular;;  . IRRIGATION AND DEBRIDEMENT FOOT Right 03/01/2017   Procedure: IRRIGATION AND DEBRIDEMENT FOOT;  Surgeon: Albertine Patricia, DPM;  Location: [facility];  Service: Podiatry;  Laterality: Right;  application of wound vac  . PERIPHERAL VASCULAR THROMBECTOMY Right 06/18/2018   Procedure: PERIPHERAL VASCULAR THROMBECTOMY;  Surgeon: Algernon Huxley, MD;  Location: [facility];  Service: Cardiovascular;  Laterality: Right;    SOCIAL HISTORY:  Social History   Tobacco Use  . Smoking status: Never Smoker  . Smokeless tobacco: Current User    Types: Snuff  Substance Use Topics  . Alcohol use: Yes    Comment: up until 01/2017 was heavy drinker...4 40 oz  qd    FAMILY HISTORY:  Family History  Problem Relation Age of Onset  . CAD Father   . Heart disease Father     DRUG ALLERGIES:  Allergies  Allergen Reactions  . Codeine Nausea And Vomiting    REVIEW OF SYSTEMS:   CONSTITUTIONAL: No fever, fatigue or weakness.  EYES: No blurred or double vision.  EARS, NOSE, AND THROAT: No tinnitus or  ear pain.  RESPIRATORY: No cough, shortness of breath, wheezing or hemoptysis.  CARDIOVASCULAR: No chest pain, orthopnea, edema.  GASTROINTESTINAL: No nausea, vomiting, diarrhea or abdominal pain.  GENITOURINARY: No dysuria, hematuria.  ENDOCRINE: No polyuria, nocturia,  HEMATOLOGY: No anemia, easy bruising or bleeding SKIN: No rash or lesion. MUSCULOSKELETAL: No joint pain or arthritis.   NEUROLOGIC: No tingling, numbness, weakness.  PSYCHIATRY: No anxiety or depression.   MEDICATIONS AT HOME:  Prior to Admission medications   Medication Sig Start Date End Date Taking? Authorizing Provider  amitriptyline (ELAVIL) 25 MG tablet Take 25 mg by mouth at bedtime.   Yes [provider]  aspirin 325 MG tablet Take 325 mg by mouth daily.   Yes [provider]  calcium acetate (PHOSLO) 667 MG capsule Take 2 capsules (1,334 mg total) by mouth 3 (three) times daily with meals. 12/16/18  Yes Barton Dubois, MD  clopidogrel (PLAVIX) 75 MG tablet Take 75 mg by mouth daily. 01/12/19  Yes [provider]  furosemide (LASIX) 80 MG tablet Take 80 mg by mouth daily.   Yes [provider]  gabapentin (NEURONTIN) 300 MG capsule Take 1 capsule (300 mg total) by mouth daily. 10/19/18  Yes Barton Dubois, MD  glipiZIDE (GLUCOTROL) 5 MG tablet Take 5 mg by mouth daily before breakfast.  07/05/18  Yes [provider]  glucose blood (ACCU-CHEK AVIVA) test strip Use as instructed.use to check blood sugar up to 3 times a day  Disp: 1 box 08/20/14  Yes Alveda Reasons, MD  insulin aspart (NOVOLOG) 100 UNIT/ML injection Inject 12 Units into the skin 3 (three) times daily with meals. Patient taking differently: Inject 12-20 Units into the skin 3 (three) times daily as needed for high blood sugar.  09/14/18  Yes Fritzi Mandes, MD  insulin detemir (LEVEMIR) 100 UNIT/ML injection Inject 0.2 mLs (20 Units total) into the skin daily. 12/16/18  Yes Barton Dubois, MD  Insulin Pen Needle 31G  X 8 MM MISC 1 Container by Does not apply route QID. qid--any brand 08/20/14  Yes Alveda Reasons, MD  Insulin Syringes, Disposable, U-100 1 ML MISC Use as directed with insulin 08/20/14  Yes Alveda Reasons, MD  Lancets (ACCU-CHEK MULTICLIX) lancets Use as instructed.  check blood sugar up to 3 times a day Disp:  1 box 08/20/14  Yes Alveda Reasons, MD  metoprolol tartrate (LOPRESSOR) 25 MG tablet Take 0.5 tablets (12.5 mg total) by mouth 2 (two) times daily. 10/19/18  Yes Barton Dubois, MD  midodrine (PROAMATINE) 10 MG tablet Take 1 tablet (10 mg total) by mouth 2 (two) times daily with a meal. Patient taking differently: Take 10 mg by mouth daily as needed (for low bp from dialysis).  10/19/18  Yes Barton Dubois, MD  mirtazapine (REMERON) 15 MG tablet Take 15 mg by mouth at bedtime.  07/12/18  Yes [provider]  mometasone-formoterol (DULERA) 100-5 MCG/ACT AERO Inhale 2 puffs into the lungs every morning. 12/16/18  Yes Barton Dubois, MD  omeprazole (PRILOSEC OTC) 20 MG tablet Take 1 tablet (20 mg  total) by mouth daily. 10/19/18  Yes Barton Dubois, MD  PROAIR HFA 108 [phone] Base) MCG/ACT inhaler Inhale 2 puffs into the lungs every 4 (four) hours as needed for wheezing or shortness of breath.  09/04/17  Yes [provider]  sodium bicarbonate 650 MG tablet Take 1 tablet (650 mg total) by mouth 2 (two) times daily. 10/19/18  Yes Barton Dubois, MD  spironolactone (ALDACTONE) 25 MG tablet Take 25 mg by mouth daily.   Yes [provider]  apixaban (ELIQUIS) 5 MG TABS tablet Take 1 tablet (5 mg total) by mouth 2 (two) times daily. 01/26/19   Carrie Mew, MD      PHYSICAL EXAMINATION:   VITAL SIGNS: Blood pressure (!) 146/87, pulse 97, temperature 97.6 F (36.4 C), temperature source Oral, resp. rate 20, height 5\' 10"  (1.778 m), weight 108.9 kg, SpO2 100 %.  GENERAL:  58 y.o.-year-old patient lying in the bed with no acute distress.  EYES: Pupils equal, round,  reactive to light and accommodation. No scleral icterus. Extraocular muscles intact.  HEENT: Head atraumatic, normocephalic. Oropharynx and nasopharynx clear.  NECK:  Supple, no jugular venous distention. No thyroid enlargement, no tenderness.  LUNGS: Normal breath sounds bilaterally, no wheezing, rales,rhonchi or crepitation. No use of accessory muscles of respiration.  CARDIOVASCULAR: S1, S2 normal. No murmurs, rubs, or gallops.  ABDOMEN: Soft, nontender, nondistended. Bowel sounds present. No organomegaly or mass.  EXTREMITIES: No pedal edema, cyanosis, or clubbing.  NEUROLOGIC: Cranial nerves II through XII are intact. Muscle strength 5/5 in all extremities. Sensation intact. Gait not checked.  PSYCHIATRIC: The patient is alert and oriented x 3.  SKIN: No obvious rash, lesion, or ulcer.   LABORATORY PANEL:   CBC Recent Labs  Lab 01/23/19 1052 01/26/19 1052  WBC 3.6* 3.1*  HGB 10.8* 11.9*  HCT 32.2* 35.5*  PLT 135* 175  MCV 90.4 90.6  MCH 30.3 30.4  MCHC 33.5 33.5  RDW 14.7 15.1  LYMPHSABS 1.2 0.9  MONOABS 0.3 0.3  EOSABS 0.0 0.0  BASOSABS 0.0 0.1   ------------------------------------------------------------------------------------------------------------------  Chemistries  Recent Labs  Lab 01/23/19 1052 01/26/19 1052  NA 121* 122*  K 5.9* 5.4*  CL 87* 87*  CO2 16* 14*  GLUCOSE 227* 176*  BUN 76* 91*  CREATININE 7.15* 8.43*  CALCIUM 8.5* 8.9   ------------------------------------------------------------------------------------------------------------------ estimated creatinine clearance is 12 mL/min (A) (by C-G formula based on SCr of 8.43 mg/dL (H)). ------------------------------------------------------------------------------------------------------------------ No results for input(s): TSH, T4TOTAL, T3FREE, THYROIDAB in the last 72 hours.  Invalid input(s): FREET3   Coagulation profile Recent Labs  Lab 01/26/19 1052  INR 0.9    ------------------------------------------------------------------------------------------------------------------- No results for input(s): DDIMER in the last 72 hours. -------------------------------------------------------------------------------------------------------------------  Cardiac Enzymes No results for input(s): CKMB, TROPONINI, MYOGLOBIN in the last 168 hours.  Invalid input(s): CK ------------------------------------------------------------------------------------------------------------------ Invalid input(s): POCBNP  ---------------------------------------------------------------------------------------------------------------  Urinalysis    Component Value Date/Time   COLORURINE YELLOW 12/15/2018 1406   APPEARANCEUR CLEAR 12/15/2018 1406   LABSPEC 1.009 12/15/2018 1406   PHURINE 7.0 12/15/2018 1406   GLUCOSEU >=500 (A) 12/15/2018 1406   HGBUR SMALL (A) 12/15/2018 1406   BILIRUBINUR NEGATIVE 12/15/2018 1406   KETONESUR NEGATIVE 12/15/2018 1406   PROTEINUR 100 (A) 12/15/2018 1406   NITRITE NEGATIVE 12/15/2018 1406   LEUKOCYTESUR NEGATIVE 12/15/2018 1406     RADIOLOGY: Dg Chest Portable 1 View  Result Date: 01/26/2019 CLINICAL DATA:  Bilateral lower extremity swelling. EXAM: PORTABLE CHEST 1 VIEW COMPARISON:  Radiograph of January 23, 2019. FINDINGS: The  heart size and mediastinal contours are within normal limits. Right internal jugular dialysis catheter is unchanged in position. No pneumothorax is noted. Left lung is clear. Stable right basilar opacity is noted concerning for atelectasis or scarring with possible associated pleural effusion. The visualized skeletal structures are unremarkable. IMPRESSION: Stable right basilar opacity is noted as described above, concerning for scarring or atelectasis with possible small pleural effusion. No significant changes noted compared to prior exam. Electronically Signed   By: Marijo Conception M.D.   On: 01/26/2019 11:01     EKG: Orders placed or performed during the hospital encounter of 01/26/19  . ED EKG  . ED EKG    IMPRESSION AND PLAN: 58 year old male patient with a known history of  end-stage renal disease on dialysis, diabetes mellitus type 2, GERD, hyperlipidemia, neuropathy, sleep apnea was referred from dialysis center by nephrologist Dr. Candiss Norse.  -Dialysis catheter nonfunction Vascular surgery evaluation for temporary access at a new site for dialysis  -End-stage renal disease Nephrology consult for dialysis  -Hyponatremia Hold diuretics Follow-up sodium level  -Mild hyperkalemia Dialysis in a.m.  -DVT prophylaxis subcu heparin  -Type 2 diabetes mellitus Diabetic diet Sliding scale coverage with insulin  All the records are reviewed and case discussed with ED provider. Management plans discussed with the patient, family and they are in agreement.  CODE STATUS: Full code Code Status History    Date Active Date Inactive Code Status Order ID Comments User Context   12/15/2018 1802 12/17/2018 1653 Full Code [id number]  Rhetta Mura, DO ED   10/17/2018 1727 10/19/2018 1610 Full Code [id number]  Barton Dubois, MD Inpatient   09/12/2018 1659 09/14/2018 2018 Full Code [id number]  Henreitta Leber, MD Inpatient   06/19/2018 1609 06/24/2018 1620 Full Code [id number]  Bettey Costa, MD Inpatient   09/18/2017 1437 09/20/2017 2103 Full Code [id number]  Demetrios Loll, MD Inpatient   02/27/2017 2054 03/07/2017 2137 Full Code [id number]  Vaughan Basta, MD Inpatient     TOTAL TIME TAKING CARE OF THIS PATIENT: 53 minutes.    Saundra Shelling M.D on 01/26/2019 at 6:56 PM  Between 7am to 6pm - Pager - [phone]  After 6pm go to [email or web address] - password [email or web address] [facility]  Office  [phone]  CC: Primary care physician; [name], MD
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
## 2019-01-26 NOTE — Progress Notes (Addendum)
[facility], [place] 01/26/19  Subjective:   Patient known to our practice from outpatient dialysis Presents because dialysis cathter would not function in outpatient setting Received tPA infusion on Friday  Objective:  Vital signs in last 24 hours:  Temp:  [97.5 F (36.4 C)] 97.5 F (36.4 C) (04/20 1027) Pulse Rate:  [100] 100 (04/20 1027) Resp:  [20] 20 (04/20 1027) BP: (140)/(88) 140/88 (04/20 1027) SpO2:  [94 %] 94 % (04/20 1027) Weight:  [108.9 kg] 108.9 kg (04/20 1026)  Weight change:  Filed Weights   01/26/19 1026  Weight: 108.9 kg    Intake/Output:   No intake or output data in the 24 hours ending 01/26/19 1134   Physical Exam: General: Well appearing  HEENT anicteric  Neck Supple  Pulm/lungs Clear, normal effort on room air  CVS/Heart No rub  Abdomen:  Soft, NT, non distended  Extremities: ++ edema b/l  Neurologic: Alert, oriented  Skin: No acute rashes  Access: Rt IJ PC       Basic Metabolic Panel:  Recent Labs  Lab 01/23/19 1052  NA 121*  K 5.9*  CL 87*  CO2 16*  GLUCOSE 227*  BUN 76*  CREATININE 7.15*  CALCIUM 8.5*     CBC: Recent Labs  Lab 01/23/19 1052 01/26/19 1052  WBC 3.6* 3.1*  NEUTROABS 2.1 1.8  HGB 10.8* 11.9*  HCT 32.2* 35.5*  MCV 90.4 90.6  PLT 135* 175      Lab Results  Component Value Date   HEPBSAG Negative 10/17/2018   HEPBSAB Non Reactive 02/28/2017   HEPBIGM Negative 02/28/2017      Microbiology:  No results found for this or any previous visit (from the past 240 hour(s)).  Coagulation Studies: Recent Labs    01/26/19 1052  LABPROT 12.5  INR 0.9    Urinalysis: No results for input(s): COLORURINE, LABSPEC, PHURINE, GLUCOSEU, HGBUR, BILIRUBINUR, KETONESUR, PROTEINUR, UROBILINOGEN, NITRITE, LEUKOCYTESUR in the last 72 hours.  Invalid input(s): APPERANCEUR    Imaging: Dg Chest Portable 1 View  Result Date: 01/26/2019 CLINICAL DATA:  Bilateral lower extremity  swelling. EXAM: PORTABLE CHEST 1 VIEW COMPARISON:  Radiograph of January 23, 2019. FINDINGS: The heart size and mediastinal contours are within normal limits. Right internal jugular dialysis catheter is unchanged in position. No pneumothorax is noted. Left lung is clear. Stable right basilar opacity is noted concerning for atelectasis or scarring with possible associated pleural effusion. The visualized skeletal structures are unremarkable. IMPRESSION: Stable right basilar opacity is noted as described above, concerning for scarring or atelectasis with possible small pleural effusion. No significant changes noted compared to prior exam. Electronically Signed   By: Marijo Conception M.D.   On: 01/26/2019 11:01     Medications:       Assessment/ Plan:  58 y.o. male with ESRD hypertension, diabetes mellitus type 2 insulin dependent, hyperlipidemia, GERD, and morbid obesity  [facility]/112kg.  1. Complication of dialysis access 2. Hyperkalemia 3. LE edema 4. ESRD, dialysis dependent  Plan: Discussed with Dr Joni Fears and Dr Lucky Cowboy. Obtain stat labs Consider permcath exchange- Dr Lucky Cowboy will evaluate Dialysis today after access is available UF goal 1.5-2 kg as tolerated Discontinue Plavix. Start Eliquis 5 mg PO BID    LOS: 0 Latesia Norrington 4/20/202011:34 AM  [place], [place]  Note: This note was prepared with Dragon dictation. Any transcription errors are unintentional

## 2019-01-26 NOTE — Progress Notes (Signed)
Notified by dialysis nurse that new catheter is also non functional. Patient will need temporary access tomorrow for HD. Recommend admission to hospital for further management.

## 2019-01-26 NOTE — ED Notes (Addendum)
Nephrologist specialist at bedside

## 2019-01-26 NOTE — ED Notes (Signed)
ED TO INPATIENT HANDOFF REPORT  ED Nurse Name and Phone #: Johnney Ou, RN [phone]  S Name/Age/Gender Steven [name] 58 y.o. male Room/Bed: ED07A/ED07A  Code Status   Code Status: Prior  Home/SNF/Other Home Patient oriented to: self, place, time and situation Is this baseline? Yes   Triage Complete: Triage complete  Chief Complaint Peripheral edema [R60.9] End-stage renal disease on hemodialysis ([place]) [N18.6, Z99.2] Clotted dialysis access, initial encounter [facility]) [T82.49XA]  Triage Note Pt states dialysis port not working, has been over a week since he's had dialysis. NAD.    Allergies Allergies  Allergen Reactions  . Codeine Nausea And Vomiting    Level of Care/Admitting Diagnosis ED Disposition    ED Disposition Condition [facility] Area: [place] [[id number]]  Level of Care: Med-Surg [16]  Covid Evaluation: N/A  Diagnosis: Dialysis AV fistula malfunction [facility]) [[id number]]  Admitting Physician: Saundra Shelling [[id number]]  Attending Physician: Saundra Shelling [[id number]]  Estimated length of stay: past midnight tomorrow  Certification:: I certify this patient will need inpatient services for at least 2 midnights  PT Class (Do Not Modify): Inpatient [101]  PT Acc Code (Do Not Modify): Private [1]       B Medical/Surgery History Past Medical History:  Diagnosis Date  . Anemia   . Cellulitis and abscess of right lower extremity 03/01/2017  . Chronic kidney disease    dialysis m,w,f  . Diabetes mellitus without complication ([place])   . Edema extremities 03/01/2017   bilateral swelling  . GERD (gastroesophageal reflux disease)   . High cholesterol   . High triglycerides   . Hypertension   . Neuropathy   . Sleep apnea    can't use cpap d/t feelings of suffocation   Past Surgical History:  Procedure Laterality Date  . A/V FISTULAGRAM Right 08/27/2017   Procedure: A/V FISTULAGRAM;  Surgeon: Katha Cabal, MD;   Location: [place] [facility];  Service: Cardiovascular;  Laterality: Right;  . A/V FISTULAGRAM Right 11/04/2018   Procedure: A/V FISTULAGRAM;  Surgeon: Katha Cabal, MD;  Location: [place] [facility];  Service: Cardiovascular;  Laterality: Right;  . A/V SHUNT INTERVENTION N/A 06/23/2018   Procedure: A/V SHUNT INTERVENTION;  Surgeon: Algernon Huxley, MD;  Location: [place] [facility];  Service: Cardiovascular;  Laterality: N/A;  . APPENDECTOMY  2010  . AV FISTULA PLACEMENT Right 06/14/2017   Procedure: ARTERIOVENOUS (AV) FISTULA CREATION WRIST;  Surgeon: Katha Cabal, MD;  Location: [facility] ORS;  Service: Vascular;  Laterality: Right;  . DIALYSIS/PERMA CATHETER INSERTION N/A 03/05/2017   Procedure: Dialysis/Perma Catheter Insertion;  Surgeon: Katha Cabal, MD;  Location: [place] [facility];  Service: Cardiovascular;  Laterality: N/A;  . DIALYSIS/PERMA CATHETER INSERTION N/A 09/20/2017   Procedure: DIALYSIS/PERMA CATHETER INSERTION;  Surgeon: Katha Cabal, MD;  Location: [place] [facility];  Service: Cardiovascular;  Laterality: N/A;  . EXCHANGE OF A DIALYSIS CATHETER  03/29/2017   Procedure: Exchange Of A Dialysis Catheter;  Surgeon: Katha Cabal, MD;  Location: [place] [facility];  Service: Cardiovascular;;  . IRRIGATION AND DEBRIDEMENT FOOT Right 03/01/2017   Procedure: IRRIGATION AND DEBRIDEMENT FOOT;  Surgeon: Albertine Patricia, DPM;  Location: [facility] ORS;  Service: Podiatry;  Laterality: Right;  application of wound vac  . PERIPHERAL VASCULAR THROMBECTOMY Right 06/18/2018   Procedure: PERIPHERAL VASCULAR THROMBECTOMY;  Surgeon: Algernon Huxley, MD;  Location: [place] [facility];  Service: Cardiovascular;  Laterality: Right;  A IV Location/Drains/Wounds Patient Lines/Drains/Airways Status   Active Line/Drains/Airways    Name:   Placement date:   Placement time:   Site:   Days:   Peripheral IV 01/26/19 Left Hand   01/26/19    1051    Hand   less than 1   Fistula  / Graft Right Forearm Arteriovenous fistula   06/14/17    1230    Forearm   591   Hemodialysis Catheter Left Internal jugular   -    -    Internal jugular             Intake/Output Last 24 hours  Intake/Output Summary (Last 24 hours) at 01/26/2019 1930 Last data filed at 01/26/2019 1400 Gross per 24 hour  Intake 0 ml  Output 275 ml  Net -275 ml    Labs/Imaging Results for orders placed or performed during the hospital encounter of 01/26/19 (from the past 48 hour(s))  Basic metabolic panel     Status: Abnormal   Collection Time: 01/26/19 10:52 AM  Result Value Ref Range   Sodium 122 (L) 135 - 145 mmol/L    Comment: LYTES REPEATED CHC   Potassium 5.4 (H) 3.5 - 5.1 mmol/L   Chloride 87 (L) 98 - 111 mmol/L   CO2 14 (L) 22 - 32 mmol/L   Glucose, Bld 176 (H) 70 - 99 mg/dL   BUN 91 (H) 6 - 20 mg/dL   Creatinine, Ser 8.43 (H) 0.61 - 1.24 mg/dL   Calcium 8.9 8.9 - 10.3 mg/dL   GFR calc non Af Amer 6 (L) >60 mL/min   GFR calc Af Amer 7 (L) >60 mL/min   Anion gap 21 (H) 5 - 15    Comment: Performed at [facility], [address] [place], [place] [address]  Protime-INR     Status: None   Collection Time: 01/26/19 10:52 AM  Result Value Ref Range   Prothrombin Time 12.5 11.4 - 15.2 seconds   INR 0.9 0.8 - 1.2    Comment: (NOTE) INR goal varies based on device and disease states. Performed at [facility], [address] [place], [place] [address]   CBC with Differential     Status: Abnormal   Collection Time: 01/26/19 10:52 AM  Result Value Ref Range   WBC 3.1 (L) 4.0 - 10.5 K/uL   RBC 3.92 (L) 4.22 - 5.81 MIL/uL   Hemoglobin 11.9 (L) 13.0 - 17.0 g/dL   HCT 35.5 (L) 39.0 - 52.0 %   MCV 90.6 80.0 - 100.0 fL   MCH 30.4 26.0 - 34.0 pg   MCHC 33.5 30.0 - 36.0 g/dL   RDW 15.1 11.5 - 15.5 %   Platelets 175 150 - 400 K/uL   nRBC 0.0 0.0 - 0.2 %   Neutrophils Relative % 58 %   Neutro Abs 1.8 1.7 - 7.7 K/uL   Lymphocytes Relative 30 %   Lymphs Abs 0.9 0.7 -  4.0 K/uL   Monocytes Relative 9 %   Monocytes Absolute 0.3 0.1 - 1.0 K/uL   Eosinophils Relative 1 %   Eosinophils Absolute 0.0 0.0 - 0.5 K/uL   Basophils Relative 2 %   Basophils Absolute 0.1 0.0 - 0.1 K/uL   Immature Granulocytes 0 %   Abs Immature Granulocytes 0.01 0.00 - 0.07 K/uL    Comment: Performed at [facility], [address] [place], [place] [address]  Glucose, capillary     Status: Abnormal   Collection  Time: 01/26/19  2:14 PM  Result Value Ref Range   Glucose-Capillary 158 (H) 70 - 99 mg/dL   Dg Chest Portable 1 View  Result Date: 01/26/2019 CLINICAL DATA:  Bilateral lower extremity swelling. EXAM: PORTABLE CHEST 1 VIEW COMPARISON:  Radiograph of January 23, 2019. FINDINGS: The heart size and mediastinal contours are within normal limits. Right internal jugular dialysis catheter is unchanged in position. No pneumothorax is noted. Left lung is clear. Stable right basilar opacity is noted concerning for atelectasis or scarring with possible associated pleural effusion. The visualized skeletal structures are unremarkable. IMPRESSION: Stable right basilar opacity is noted as described above, concerning for scarring or atelectasis with possible small pleural effusion. No significant changes noted compared to prior exam. Electronically Signed   By: Marijo Conception M.D.   On: 01/26/2019 11:01    Pending Labs Unresulted Labs (From admission, onward)    Start     Ordered   Signed and Held  CBC  (heparin)  Once,   R    Comments:  Baseline for heparin therapy IF NOT ALREADY DRAWN.  Notify MD if PLT < 100 K.    Signed and Held   Signed and Held  Creatinine, serum  (heparin)  Once,   R    Comments:  Baseline for heparin therapy IF NOT ALREADY DRAWN.    Signed and Held   Signed and Held  Basic metabolic panel  Tomorrow morning,   R     Signed and Held   Signed and Held  CBC  Tomorrow morning,   R     Signed and Held          Vitals/Pain Today's Vitals   01/26/19  1545 01/26/19 1550 01/26/19 1600 01/26/19 1607  BP:   (!) 142/106 (!) 146/87  Pulse:   97 97  Resp:   20 20  Temp:      TempSrc:      SpO2:   100% 100%  Weight:      Height:      PainSc: 0-No pain 0-No pain 0-No pain 0-No pain    Isolation Precautions No active isolations  Medications Medications  Chlorhexidine Gluconate Cloth 2 % PADS 6 each (has no administration in time range)  apixaban (ELIQUIS) tablet 5 mg (has no administration in time range)  midazolam (VERSED) 2 MG/2ML injection (has no administration in time range)  fentaNYL (SUBLIMAZE) 100 MCG/2ML injection (has no administration in time range)  lidocaine-EPINEPHrine (XYLOCAINE-EPINEPHrine) 1 %-1:200000 (PF) injection (has no administration in time range)  Heparin (Porcine) in NaCl 1000-0.9 UT/500ML-% SOLN (has no administration in time range)  heparin 10000 UNIT/ML injection (has no administration in time range)  ceFAZolin (ANCEF) 1-4 GM/50ML-% IVPB (has no administration in time range)  heparin 1000 UNIT/ML injection (has no administration in time range)  insulin aspart (novoLOG) injection 0-9 Units (has no administration in time range)  insulin aspart (novoLOG) injection 0-5 Units (has no administration in time range)  ceFAZolin (ANCEF) 1-4 GM/50ML-% IVPB (1,000 mg  New Bag/Given 01/26/19 1520)    Mobility walks with device Low fall risk   Focused Assessments Renal Assessment Handoff:  Hemodialysis Schedule: Hemodialysis Schedule: Monday/Wednesday/Friday Last Hemodialysis date and time: 01/16/2019    Restricted appendage: right chest not working     R Recommendations: See Admitting Provider Note  Report given to:   Additional Notes:

## 2019-01-27 ENCOUNTER — Encounter: Payer: Self-pay | Admitting: Vascular Surgery

## 2019-01-27 ENCOUNTER — Inpatient Hospital Stay
Admit: 2019-01-27 | Discharge: 2019-01-27 | Disposition: A | Discharge status: Home / Self Care | Payer: Medicare Other | Attending: Physician Assistant | Admitting: Physician Assistant

## 2019-01-27 ENCOUNTER — Encounter
Admission: EM | Disposition: A | Discharge status: Home / Self Care | Payer: Self-pay | Source: Home / Self Care | Attending: Internal Medicine

## 2019-01-27 ENCOUNTER — Inpatient Hospital Stay: Payer: Medicare Other

## 2019-01-27 ENCOUNTER — Ambulatory Visit: Admit: 2019-01-27 | Payer: Medicare Other | Admitting: Vascular Surgery

## 2019-01-27 DIAGNOSIS — Z992 Dependence on renal dialysis: Secondary | ICD-10-CM

## 2019-01-27 DIAGNOSIS — R918 Other nonspecific abnormal finding of lung field: Secondary | ICD-10-CM

## 2019-01-27 DIAGNOSIS — I871 Compression of vein: Secondary | ICD-10-CM

## 2019-01-27 DIAGNOSIS — N186 End stage renal disease: Secondary | ICD-10-CM

## 2019-01-27 DIAGNOSIS — T82898A Other specified complication of vascular prosthetic devices, implants and grafts, initial encounter: Secondary | ICD-10-CM

## 2019-01-27 HISTORY — PX: DIALYSIS/PERMA CATHETER INSERTION: CATH118288

## 2019-01-27 LAB — BASIC METABOLIC PANEL
Anion gap: 16 — ABNORMAL HIGH (ref 5–15)
BUN: 95 mg/dL — ABNORMAL HIGH (ref 6–20)
CO2: 17 mmol/L — ABNORMAL LOW (ref 22–32)
Calcium: 8.4 mg/dL — ABNORMAL LOW (ref 8.9–10.3)
Chloride: 94 mmol/L — ABNORMAL LOW (ref 98–111)
Creatinine, Ser: 8.69 mg/dL — ABNORMAL HIGH (ref 0.61–1.24)
GFR calc Af Amer: 7 mL/min — ABNORMAL LOW (ref >60)
GFR calc non Af Amer: 6 mL/min — ABNORMAL LOW (ref >60)
Glucose, Bld: 134 mg/dL — ABNORMAL HIGH (ref 70–99)
Potassium: 5 mmol/L (ref 3.5–5.1)
Sodium: 127 mmol/L — ABNORMAL LOW (ref 135–145)

## 2019-01-27 LAB — GLUCOSE, CAPILLARY
Glucose-Capillary: 113 mg/dL — ABNORMAL HIGH (ref 70–99)
Glucose-Capillary: 94 mg/dL (ref 70–99)
Glucose-Capillary: 142 mg/dL — ABNORMAL HIGH (ref 70–99)
Glucose-Capillary: 223 mg/dL — ABNORMAL HIGH (ref 70–99)
Glucose-Capillary: 265 mg/dL — ABNORMAL HIGH (ref 70–99)
Glucose-Capillary: 189 mg/dL — ABNORMAL HIGH (ref 70–99)

## 2019-01-27 LAB — CBC
HCT: 29.8 % — ABNORMAL LOW (ref 39.0–52.0)
Hemoglobin: 9.9 g/dL — ABNORMAL LOW (ref 13.0–17.0)
MCH: 30.5 pg (ref 26.0–34.0)
MCHC: 33.2 g/dL (ref 30.0–36.0)
MCV: 91.7 fL (ref 80.0–100.0)
Platelets: 139 10*3/uL — ABNORMAL LOW (ref 150–400)
RBC: 3.25 MIL/uL — ABNORMAL LOW (ref 4.22–5.81)
RDW: 15.2 % (ref 11.5–15.5)
WBC: 3.9 10*3/uL — ABNORMAL LOW (ref 4.0–10.5)
nRBC: 0 % (ref 0.0–0.2)

## 2019-01-27 LAB — APTT: aPTT: 27 seconds (ref 24–36)

## 2019-01-27 SURGERY — DIALYSIS/PERMA CATHETER INSERTION
Anesthesia: Moderate Sedation

## 2019-01-27 MED ORDER — HEPARIN SODIUM (PORCINE) 1000 UNIT/ML DIALYSIS: 20.0000 [IU]/kg | INTRAMUSCULAR | Status: DC | PRN

## 2019-01-27 MED ORDER — HEPARIN SODIUM (PORCINE) 1000 UNIT/ML IJ SOLN
INTRAMUSCULAR | Status: AC
  Filled 2019-01-27: qty 1

## 2019-01-27 MED ORDER — HEPARIN SODIUM (PORCINE) 10000 UNIT/ML IJ SOLN
INTRAMUSCULAR | Status: AC
  Filled 2019-01-27: qty 1

## 2019-01-27 MED ORDER — FENTANYL CITRATE (PF) 100 MCG/2ML IJ SOLN
INTRAMUSCULAR | Status: AC
  Filled 2019-01-27: qty 2

## 2019-01-27 MED ORDER — MIDAZOLAM HCL 2 MG/2ML IJ SOLN
INTRAMUSCULAR | Status: DC | PRN
  Administered 2019-01-27: 0.5 mg via INTRAVENOUS
  Administered 2019-01-27: 2 mg via INTRAVENOUS

## 2019-01-27 MED ORDER — LIDOCAINE-EPINEPHRINE (PF) 1 %-1:200000 IJ SOLN
INTRAMUSCULAR | Status: AC
  Filled 2019-01-27: qty 30

## 2019-01-27 MED ORDER — FENTANYL CITRATE (PF) 100 MCG/2ML IJ SOLN
INTRAMUSCULAR | Status: DC | PRN
  Administered 2019-01-27: 25 ug via INTRAVENOUS
  Administered 2019-01-27: 50 ug via INTRAVENOUS

## 2019-01-27 MED ORDER — MIDAZOLAM HCL 5 MG/5ML IJ SOLN
INTRAMUSCULAR | Status: AC
  Filled 2019-01-27: qty 5

## 2019-01-27 MED ORDER — HEPARIN (PORCINE) IN NACL 1000-0.9 UT/500ML-% IV SOLN
INTRAVENOUS | Status: AC
  Filled 2019-01-27: qty 500

## 2019-01-27 MED ORDER — CEFAZOLIN SODIUM-DEXTROSE 1-4 GM/50ML-% IV SOLN
1.0000 g | Freq: Once | INTRAVENOUS | Status: AC
  Administered 2019-01-27: 1 g via INTRAVENOUS

## 2019-01-27 MED ORDER — APIXABAN 5 MG PO TABS: 5.0000 mg | ORAL_TABLET | Freq: Two times a day (BID) | ORAL | Status: DC

## 2019-01-27 MED ORDER — CEFAZOLIN SODIUM-DEXTROSE 2-4 GM/100ML-% IV SOLN
INTRAVENOUS | Status: AC
  Filled 2019-01-27: qty 100

## 2019-01-27 MED ORDER — HEPARIN (PORCINE) 25000 UT/250ML-% IV SOLN
1650.0000 [IU]/h | INTRAVENOUS | Status: DC
  Administered 2019-01-27 – 2019-01-29 (×4): 1650 [IU]/h via INTRAVENOUS
  Filled 2019-01-27 (×4): qty 250

## 2019-01-27 SURGICAL SUPPLY — 14 items
ADH SKN CLS APL DERMABOND .7 (GAUZE/BANDAGES/DRESSINGS) ×1
CATH PALINDROME RT-P 15FX55CM (CATHETERS) ×2 IMPLANT
DERMABOND ADVANCED (GAUZE/BANDAGES/DRESSINGS) ×2
DERMABOND ADVANCED .7 DNX12 (GAUZE/BANDAGES/DRESSINGS) IMPLANT
NDL ENTRY 21GA 7CM ECHOTIP (NEEDLE) IMPLANT
NEEDLE ENTRY 21GA 7CM ECHOTIP (NEEDLE) ×3 IMPLANT
PACK ANGIOGRAPHY (CUSTOM PROCEDURE TRAY) ×2 IMPLANT
SET INTRO CAPELLA COAXIAL (SET/KITS/TRAYS/PACK) ×2 IMPLANT
SHEATH BRITE TIP 5FRX11 (SHEATH) ×2 IMPLANT
SUT MNCRL AB 4-0 PS2 18 (SUTURE) ×2 IMPLANT
SUT SILK 0 FSL (SUTURE) ×2 IMPLANT
TOWEL OR 17X26 4PK STRL BLUE (TOWEL DISPOSABLE) ×2 IMPLANT
WIRE J 3MM .035X145CM (WIRE) ×2 IMPLANT
WIRE NITINOL .018 (WIRE) ×2 IMPLANT

## 2019-01-27 NOTE — Progress Notes (Signed)
*  PRELIMINARY RESULTS* Echocardiogram 2D Echocardiogram has been performed.  Steven [name] 01/27/2019, 5:01 PM

## 2019-01-27 NOTE — Op Note (Addendum)
OPERATIVE NOTE   PROCEDURE: 1. Insertion of tunneled dialysis catheter right femoral approach with ultrasound and fluoroscopic guidance.  PRE-OPERATIVE DIAGNOSIS: Superior vena cava syndrome; complication dialysis access with nonfunction of the right IJ tunneled catheter; end-stage renal disease requiring hemodialysis  POST-OPERATIVE DIAGNOSIS: Same  SURGEON: Hortencia Pilar.  ANESTHESIA: Conscious sedation was administered under my direct supervision by the interventional radiology RN. IV Versed plus fentanyl were utilized. Continuous ECG, pulse oximetry and blood pressure was monitored throughout the entire procedure. Conscious sedation was for a total of 35 minutes.  ESTIMATED BLOOD LOSS: Minimal cc  CONTRAST USED:  None  FLUOROSCOPY TIME: 0.8 minutes  INDICATIONS:   Steven [name] a 58 y.o. y.o. male who presents with nonfunction of his existing catheter.  He underwent exchange yesterday unfortunately they are still unable to dialyze using this catheter.  At the procedure yesterday they identified a large amount of thrombus within the superior vena cava.  Given this finding catheter-based access from the chest is problematic.  He is therefore undergoing placement of a femoral tunnel catheter.  Risks and benefits of been explained as well as the indications and this situation.  All questions were answered the patient agrees to proceed with catheter placement.  DESCRIPTION: After obtaining full informed written consent, the patient was positioned supine. The right groin was prepped and draped in a sterile fashion. Ultrasound was placed in a sterile sleeve. Ultrasound was utilized to identify the right common femoral vein which is noted to be echolucent and compressible indicating patency. Image is recorded for the permanent record. Under direct ultrasound visualization a micro-needle is inserted into the vein followed by the micro-wire. Micro-sheath was then advanced and a J wire is  inserted without difficulty under fluoroscopic guidance. Small counterincision was made at the wire insertion site. Dilators are passed over the wire and the tunneled dialysis catheter is fed into the central venous system without difficulty.  Under fluoroscopy the catheter tip positioned at the atrial caval junction. The catheter is then approximated to the thigh and an exit site selected. 1% lidocaine is infiltrated in soft tissues at this level small incision is made and the tunneling device is then passed from the exit site to the thigh counterincision. Catheter is then connected to the tunneling device and the catheter was pulled subcutaneously. It is then transected and the hub assembly connected without difficulty. Both lumens aspirate and flush easily. After verification of smooth contour with proper tip position under fluoroscopy the catheter is packed with 5000 units of heparin per lumen.  Catheter secured to the skin of the right thigh with 0 silk. A sterile dressing is applied with a Biopatch.  COMPLICATIONS: None  CONDITION: Good  Hortencia Pilar [place] [facility]. Office:  [phone]   01/27/2019,9:44 AM

## 2019-01-27 NOTE — Progress Notes (Signed)
Pre HD Tx    01/27/19 1019  Hand-Off documentation  Report given to (Full Name) Louretta Shorten RN  Report received from (Full Name) Caryl Pina RN Specials  Vital Signs  Temp 97.6 F (36.4 C)  Temp Source Oral  Pulse Rate (!) 104  Pulse Rate Source Monitor  Resp 12  BP (!) 128/96  BP Location Left Arm  BP Method Automatic  Patient Position (if appropriate) Lying  Oxygen Therapy  SpO2 98 %  O2 Device Room Air  Pain Assessment  Pain Scale 0-10  Pain Score 0  Dialysis Weight  Weight 115.7 kg  Type of Weight Pre-Dialysis  Time-Out for Hemodialysis  What Procedure? Hemodialysis (Simultaneous filing. User may not have seen previous data.)  Pt Identifiers(min of two) First/Last Name;MRN/Account# (Simultaneous filing. User may not have seen previous data.)  Correct Site? Yes (Simultaneous filing. User may not have seen previous data.)  Correct Side? Yes (Simultaneous filing. User may not have seen previous data.)  Correct Procedure? Yes (Simultaneous filing. User may not have seen previous data.)  Consents Verified? Yes (Simultaneous filing. User may not have seen previous data.)  Rad Studies Available? N/A (Simultaneous filing. User may not have seen previous data.)  Safety Precautions Reviewed? Yes (Simultaneous filing. User may not have seen previous data.)  Engineer, civil (consulting) Number 2  Station Number 1  UF/Alarm Test Passed  Conductivity: Meter 13.8  Conductivity: Machine  13.9  pH 7  Reverse Osmosis main  Normal Saline Lot Number [id number]  Dialyzer Lot Number [id number]  Disposable Set Lot Number [id number]  Machine Temperature 96.8 F (36 C)  Musician and Audible Yes  Blood Lines Intact and Secured Yes  Pre Treatment Patient Checks  Vascular access used during treatment Catheter (placed today-verified per MD)  Hepatitis B Surface Antigen Results Negative  Date Hepatitis B Surface Antigen Drawn 12/25/18  Hepatitis B Surface Antibody  (<10)  Date  Hepatitis B Surface Antibody Drawn 07/22/18  Hemodialysis Consent Verified Yes  Hemodialysis Standing Orders Initiated Yes  ECG (Telemetry) Monitor On Yes  Prime Ordered Normal Saline  Length of  DialysisTreatment -hour(s) 3 Hour(s)  Dialysis Treatment Comments na 140  Dialyzer Elisio 17H NR  Dialysate 2K, 2.5 Ca  Dialysis Anticoagulant None  Dialysate Flow Ordered 600  Blood Flow Rate Ordered 300 mL/min  Ultrafiltration Goal 2 Liters  Dialysis Blood Pressure Support Ordered Normal Saline  Education / Care Plan  Dialysis Education Provided Yes  Documented Education in Care Plan Yes

## 2019-01-27 NOTE — Consult Note (Signed)
ANTICOAGULATION CONSULT NOTE - Initial Consult  Pharmacy Consult for heparin drip management Indication: VTE prophylaxis  Patient Measurements: Height: 5\' 10"  (177.8 cm) Weight: 240 lb (108.9 kg) IBW/kg (Calculated) : 73 Heparin Dosing Weight: 96.5 kg  Vital Signs: Temp: 97.5 F (36.4 C) (04/21 0611) Temp Source: Oral (04/21 0611) BP: 138/83 (04/21 1002) Pulse Rate: 107 (04/21 1002)  Labs: Recent Labs    01/26/19 1052 01/27/19 0310  HGB 11.9* 9.9*  HCT 35.5* 29.8*  PLT 175 139*  LABPROT 12.5  --   INR 0.9  --   CREATININE 8.43* 8.69*    Estimated Creatinine Clearance: 11.6 mL/min (A) (by C-G formula based on SCr of 8.69 mg/dL (H)).   Medical History: Past Medical History:  Diagnosis Date  . Anemia   . Cellulitis and abscess of right lower extremity 03/01/2017  . Chronic kidney disease    dialysis m,w,f  . Diabetes mellitus without complication ([place])   . Edema extremities 03/01/2017   bilateral swelling  . GERD (gastroesophageal reflux disease)   . High cholesterol   . High triglycerides   . Hypertension   . Neuropathy   . Sleep apnea    can't use cpap d/t feelings of suffocation    Medications:  Scheduled:  . [MAR Hold] amitriptyline  25 mg Oral QHS  . [MAR Hold] calcium acetate  1,334 mg Oral TID WC  . [MAR Hold] Chlorhexidine Gluconate Cloth  6 each Topical Q0600  . fentaNYL      . [MAR Hold] gabapentin  300 mg Oral Daily  . [MAR Hold] glipiZIDE  5 mg Oral QAC breakfast  . Heparin (Porcine) in NaCl      . heparin      . [MAR Hold] insulin aspart  0-5 Units Subcutaneous QHS  . [MAR Hold] insulin aspart  0-9 Units Subcutaneous TID WC  . [MAR Hold] insulin aspart  12 Units Subcutaneous TID WC  . [MAR Hold] insulin detemir  20 Units Subcutaneous Daily  . lidocaine-EPINEPHrine      . [MAR Hold] metoprolol tartrate  12.5 mg Oral BID  . midazolam      . [MAR Hold] midodrine  10 mg Oral BID WC  . [MAR Hold] mirtazapine  15 mg Oral QHS  . [MAR Hold]  mometasone-formoterol  2 puff Inhalation BH-q7a  . [MAR Hold] pantoprazole  40 mg Oral Daily  . [MAR Hold] sodium bicarbonate  650 mg Oral BID  . [MAR Hold] sodium chloride flush  3 mL Intravenous Q12H    Assessment: 58 y.o. male with a h/o ESRD on HD and was referred from dialysis center by nephrologist Dr. Candiss Norse.  Patient dialysis catheter was recently changed at the center but was nonfunctional. He has a temp cath in place and received HD today but needs a HERO graft which will be scheduled for Friday following cardiology clearance. He is on no chronic anticoagulation PTA. Hgb, PLT trending down: will continue to monitor, baseline INR 0.9, baseline aPTT on order  Goal of Therapy:  Heparin level 0.3-0.7 units/ml Monitor platelets by anticoagulation protocol: Yes   Plan:  Start heparin infusion at 1650 units/hr: initial bolus being withheld after discussion with the treatment team due to the recent invasive procedure this morning Check anti-Xa level in 8 hours and daily while on heparin Continue to monitor H&H and platelets  Dallie Piles, PharmD 01/27/2019,10:15 AM

## 2019-01-27 NOTE — Progress Notes (Signed)
   01/27/19 1300  Clinical Encounter Type  Visited With Patient not available  Visit Type Initial  Referral From Nurse  Consult/Referral To Chaplain  Chaplain received OR for AD, patient was not in room upon arrival. Chaplain will pass on to the 24 hr on call Chaplain later today.
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
## 2019-01-27 NOTE — TOC Benefit Eligibility Note (Signed)
Transition of Care [facility]) Benefit Eligibility Note    Patient Details  Name: Steven [name] MRN: [id number] Date of Birth: [birth year]   Medication/Dose: Eliquis 5mg  BID  Covered?: Yes     Prescription Coverage Preferred Pharmacy: [facility], [facility] [place] with Person/Company/Phone Number:: Devin with [employer]: [phone]  Co-Pay: $372.79 estimated copay for 30 day supply  Prior Approval: No  Deductible: Unmet(Deductible: $365.00.  Unmet as of time of call.)  Additional Notes: Generic Apixaban was not found on plan's formulary.    Dannette Barbara Phone Number: [phone] or [phone] 01/27/2019, 10:36 AM

## 2019-01-27 NOTE — Progress Notes (Signed)
Post HD Tx   01/27/19 1400  Hand-Off documentation  Report given to (Full Name) Jordan Hawks RN   Report received from (Full Name) Trellis Paganini RN  Vital Signs  Temp 98 F (36.7 C)  Temp Source Oral  Pulse Rate 92  Pulse Rate Source Monitor  Resp 12  BP 120/67  BP Location Left Arm  BP Method Automatic  Patient Position (if appropriate) Lying  Oxygen Therapy  SpO2 95 %  O2 Device Room Air  Pain Assessment  Pain Scale 0-10  Pain Score 5  Pain Location Head (headache)  Dialysis Weight  Weight 114.3 kg  Type of Weight Post-Dialysis  Post-Hemodialysis Assessment  Rinseback Volume (mL) 250 mL  Dialyzer Clearance Lightly streaked  Duration of HD Treatment -hour(s) 3 hour(s)  Hemodialysis Intake (mL) 500 mL  UF Total -Machine (mL) 1800 mL  Net UF (mL) 1300 mL  Tolerated HD Treatment Yes  Note  Observations femoral temp cath capped and clamped

## 2019-01-27 NOTE — Progress Notes (Signed)
[facility] [place] NAME: Steven [name]    MR#:  [id number]  DATE OF BIRTH:  1961-10-01  SUBJECTIVE:  CHIEF COMPLAINT: Patient was seen and examined in dialysis unit.  Reluctant to stay another 2 to 3 days in the hospital for graft placement.  Resting comfortably but hypotensive denies any dizziness or chest pain or shortness of breath  REVIEW OF SYSTEMS:  CONSTITUTIONAL: No fever, fatigue or weakness.  EYES: No blurred or double vision.  EARS, NOSE, AND THROAT: No tinnitus or ear pain.  RESPIRATORY: No cough, shortness of breath, wheezing or hemoptysis.  CARDIOVASCULAR: No chest pain, orthopnea, edema.  GASTROINTESTINAL: No nausea, vomiting, diarrhea or abdominal pain.  GENITOURINARY: No dysuria, hematuria.  ENDOCRINE: No polyuria, nocturia,  HEMATOLOGY: No anemia, easy bruising or bleeding SKIN: No rash or lesion. MUSCULOSKELETAL: No joint pain or arthritis.   NEUROLOGIC: No tingling, numbness, weakness.  PSYCHIATRY: No anxiety or depression.   DRUG ALLERGIES:   Allergies  Allergen Reactions  . Codeine Nausea And Vomiting    VITALS:  Blood pressure 107/68, pulse 93, temperature (!) 97.5 F (36.4 C), temperature source Oral, resp. rate 15, height 5\' 10"  (1.778 m), weight 115.7 kg, SpO2 95 %.  PHYSICAL EXAMINATION:  GENERAL:  58 y.o.-year-old patient lying in the bed with no acute distress.  EYES: Pupils equal, round, reactive to light and accommodation. No scleral icterus. Extraocular muscles intact.  HEENT: Head atraumatic, normocephalic. Oropharynx and nasopharynx clear.  NECK:  Supple, no jugular venous distention. No thyroid enlargement, no tenderness.  LUNGS: Normal breath sounds bilaterally, no wheezing, rales,rhonchi or crepitation. No use of accessory muscles of respiration.  CARDIOVASCULAR: S1, S2 normal. No murmurs, rubs, or gallops.  ABDOMEN: Soft, nontender, nondistended. Bowel sounds present. EXTREMITIES:  Right groin with new dialysis catheter no pedal edema, cyanosis, or clubbing.  NEUROLOGIC: Awake alert and oriented x3 sensation intact. Gait not checked.  PSYCHIATRIC: The patient is alert and oriented x 3.  SKIN: No obvious rash, lesion, or ulcer.    LABORATORY PANEL:   CBC Recent Labs  Lab 01/27/19 0310  WBC 3.9*  HGB 9.9*  HCT 29.8*  PLT 139*   ------------------------------------------------------------------------------------------------------------------  Chemistries  Recent Labs  Lab 01/27/19 0310  NA 127*  K 5.0  CL 94*  CO2 17*  GLUCOSE 134*  BUN 95*  CREATININE 8.69*  CALCIUM 8.4*   ------------------------------------------------------------------------------------------------------------------  Cardiac Enzymes No results for input(s): TROPONINI in the last 168 hours. ------------------------------------------------------------------------------------------------------------------  RADIOLOGY:  Dg Chest Portable 1 View  Result Date: 01/26/2019 CLINICAL DATA:  Bilateral lower extremity swelling. EXAM: PORTABLE CHEST 1 VIEW COMPARISON:  Radiograph of January 23, 2019. FINDINGS: The heart size and mediastinal contours are within normal limits. Right internal jugular dialysis catheter is unchanged in position. No pneumothorax is noted. Left lung is clear. Stable right basilar opacity is noted concerning for atelectasis or scarring with possible associated pleural effusion. The visualized skeletal structures are unremarkable. IMPRESSION: Stable right basilar opacity is noted as described above, concerning for scarring or atelectasis with possible small pleural effusion. No significant changes noted compared to prior exam. Electronically Signed   By: Marijo Conception M.D.   On: 01/26/2019 11:01    EKG:   Orders placed or performed during the hospital encounter of 01/26/19  . ED EKG  . ED EKG    ASSESSMENT AND PLAN:    58 year old male patient with a known  history of  end-stage renal disease on  dialysis, diabetes mellitus type 2, GERD, hyperlipidemia, neuropathy, sleep apnea was referred from dialysis center by nephrologist Dr. Candiss Norse.  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 twice daily  -Nonfunctional dialysis catheter Patient had dialysis cath placement today in the right groin area by vascular surgery followed by hemodialysis today -Vascular surgery is recommending graft placement possibly on Friday.  Appreciated Dr. Delana Meyer recommendations.  Cardiology consult placed to Dr. Nehemiah Massed for clearance  -End-stage renal disease Nephrology consult for dialysis.  Seen by Dr. Candiss Norse  -Noncompliance with  follow-up appointment As per Dr. Delana Meyer patient is no-show for several months.  Reinforced the importance of being compliant with his medications and follow-up appointments.  Patient verbalized understanding   -Hyponatremia Hold diuretics Follow-up sodium level Patient is getting hemodialysis today  -Mild hyperkalemia Dialysis today and check a.m. labs   -Type 2 diabetes mellitus Diabetic diet Sliding scale coverage with insulin   -DVT prophylaxis subcu heparin   All the records are reviewed and case discussed with Care Management/Social Workerr. Management plans discussed with the patient, family and they are in agreement.  CODE STATUS: fc   TOTAL TIME TAKING CARE OF THIS PATIENT: 39  minutes.   POSSIBLE D/C IN 3-4 DAYS, DEPENDING ON CLINICAL CONDITION.  Note: This dictation was prepared with Dragon dictation along with smaller phrase technology. Any transcriptional errors that result from this process are unintentional.   Nicholes Mango M.D on 01/27/2019 at 12:46 PM  Between 7am to 6pm - Pager - [phone] After 6pm go to [email or web address] -  password [email or web address] [facility]  Office  [phone]  CC: Primary care physician; [name], MD

## 2019-01-27 NOTE — Progress Notes (Signed)
HD Tx Start   01/27/19 1035  Vital Signs  Temp (!) 97.5 F (36.4 C)  Temp Source Oral  Pulse Rate 89  Pulse Rate Source Monitor  Resp 11  BP 138/83  BP Location Left Arm  BP Method Automatic  Patient Position (if appropriate) Lying  Oxygen Therapy  SpO2 95 %  O2 Device Room Air  During Hemodialysis Assessment  Blood Flow Rate (mL/min) 300 mL/min  Arterial Pressure (mmHg) -180 mmHg  Venous Pressure (mmHg) 150 mmHg  Transmembrane Pressure (mmHg) 40 mmHg  Ultrafiltration Rate (mL/min) 830 mL/min (860 mL per HOUR)  Dialysate Flow Rate (mL/min) 800 ml/min  Conductivity: Machine  14  HD Safety Checks Performed Yes  Dialysis Fluid Bolus Normal Saline  Bolus Amount (mL) 250 mL  Intra-Hemodialysis Comments Tx initiated

## 2019-01-27 NOTE — Consult Note (Signed)
[facility] Consultation      Assessment and Plan:  Right pleural-based lung mass, possible rounded atelectasis. Chronic dyspnea on exertion, likely multifactorial from deconditioning, obesity. Obstructive sleep apnea, currently not on CPAP due to intolerance.  -We will check CT chest without contrast.  Based on the results of this we may consider further follow-up with surveillance CT, no surveillance, PET scan, versus biopsy.   Date: 01/27/2019  MRN# [id number] Steven [name] December 31, 1960  Referring Physician: Dr. Ward Chatters for lung mass.  Steven [name] is a 58 y.o. old male seen in consultation for chief complaint of:    Chief Complaint  Patient presents with   Vascular Access Problem    HPI:  Patient is a 58 year old male, known previously to me from dyspnea on exertion which was thought to be multifactorial, related to volume overload, obesity, deconditioning.  He was previously diagnosed with obstructive sleep apnea, intolerant to CPAP.  He was considered for a retest which was ordered, but the patient declined. Patient presented to the hospital on 01/23/2019 status post vascular access port for dialysis due to end-stage renal disease.  He could not complete dialysis due to nonfunctioning catheter therefore sent to the ED.  Ports were TPA with return of flow, however patient presented back to the ED on 01/26/2019 with recurrence.  He underwent permacath exchange which again was nonfunctional, due to clot in SVC therefore presented back to the hospital on 4/21 for insertion temporary right femoral catheter.  Pulmonary was called at this time as the patient had a CT of the abdomen on 10/17/2018 when he seen in the ED for hypotension.  Images personally reviewed, there is a rounded mass which is pleural-based in the right base.  This appears like rounded atelectasis.  Patient has remote history of smoking.  He has recent weight loss which has been intentional after he  started dialysis, he is lost approximately 100 pounds in 2 years.  No history of cancer or family cancer in first-degree relatives.     PMHX:   Past Medical History:  Diagnosis Date   Anemia    Cellulitis and abscess of right lower extremity 03/01/2017   Chronic kidney disease    dialysis m,w,f   Diabetes mellitus without complication ([place])    Edema extremities 03/01/2017   bilateral swelling   GERD (gastroesophageal reflux disease)    High cholesterol    High triglycerides    Hypertension    Neuropathy    Sleep apnea    can't use cpap d/t feelings of suffocation   Surgical Hx:  Past Surgical History:  Procedure Laterality Date   A/V FISTULAGRAM Right 08/27/2017   Procedure: A/V FISTULAGRAM;  Surgeon: Katha Cabal, MD;  Location: [facility];  Service: Cardiovascular;  Laterality: Right;   A/V FISTULAGRAM Right 11/04/2018   Procedure: A/V FISTULAGRAM;  Surgeon: Katha Cabal, MD;  Location: [facility];  Service: Cardiovascular;  Laterality: Right;   A/V SHUNT INTERVENTION N/A 06/23/2018   Procedure: A/V SHUNT INTERVENTION;  Surgeon: Algernon Huxley, MD;  Location: [facility];  Service: Cardiovascular;  Laterality: N/A;   APPENDECTOMY  2010   AV FISTULA PLACEMENT Right 06/14/2017   Procedure: ARTERIOVENOUS (AV) FISTULA CREATION WRIST;  Surgeon: Katha Cabal, MD;  Location: [facility];  Service: Vascular;  Laterality: Right;   DIALYSIS/PERMA CATHETER INSERTION N/A 03/05/2017   Procedure: Dialysis/Perma Catheter Insertion;  Surgeon: Katha Cabal, MD;  Location: [facility];  Service: Cardiovascular;  Laterality: N/A;   DIALYSIS/PERMA CATHETER INSERTION N/A 09/20/2017   Procedure: DIALYSIS/PERMA CATHETER INSERTION;  Surgeon: Katha Cabal, MD;  Location: [facility];  Service: Cardiovascular;  Laterality: N/A;   DIALYSIS/PERMA CATHETER INSERTION N/A 01/26/2019   Procedure: DIALYSIS/PERMA CATHETER  INSERTION/Exchange;  Surgeon: Algernon Huxley, MD;  Location: [facility];  Service: Cardiovascular;  Laterality: N/A;   DIALYSIS/PERMA CATHETER INSERTION N/A 01/27/2019   Procedure: DIALYSIS/PERMA CATHETER INSERTION;  Surgeon: Katha Cabal, MD;  Location: [facility];  Service: Cardiovascular;  Laterality: N/A;   EXCHANGE OF A DIALYSIS CATHETER  03/29/2017   Procedure: Exchange Of A Dialysis Catheter;  Surgeon: Katha Cabal, MD;  Location: [facility];  Service: Cardiovascular;;   IRRIGATION AND DEBRIDEMENT FOOT Right 03/01/2017   Procedure: IRRIGATION AND DEBRIDEMENT FOOT;  Surgeon: Albertine Patricia, DPM;  Location: [facility];  Service: Podiatry;  Laterality: Right;  application of wound vac   PERIPHERAL VASCULAR THROMBECTOMY Right 06/18/2018   Procedure: PERIPHERAL VASCULAR THROMBECTOMY;  Surgeon: Algernon Huxley, MD;  Location: [facility];  Service: Cardiovascular;  Laterality: Right;   Family Hx:  Family History  Problem Relation Age of Onset   CAD Father    Heart disease Father    Social Hx:   Social History   Tobacco Use   Smoking status: Never Smoker   Smokeless tobacco: Current User    Types: Snuff  Substance Use Topics   Alcohol use: Yes    Comment: up until 01/2017 was heavy drinker...4 40 oz qd   Drug use: No   Medication:    Current Facility-Administered Medications:    0.9 %  sodium chloride infusion, 250 mL, Intravenous, PRN, Pyreddy, Pavan, MD   acetaminophen (TYLENOL) tablet 650 mg, 650 mg, Oral, Q6H PRN **OR** acetaminophen (TYLENOL) suppository 650 mg, 650 mg, Rectal, Q6H PRN, Pyreddy, Pavan, MD   albuterol (PROVENTIL) (2.5 MG/3ML) 0.083% nebulizer solution 3 mL, 3 mL, Inhalation, Q4H PRN, Pyreddy, Pavan, MD   amitriptyline (ELAVIL) tablet 25 mg, 25 mg, Oral, QHS, Pyreddy, Pavan, MD, 25 mg at 01/26/19 2226   calcium acetate (PHOSLO) capsule 1,334 mg, 1,334 mg, Oral, TID WC, Pyreddy, Pavan, MD   ceFAZolin  (ANCEF) 2-4 GM/100ML-% IVPB, , , ,    Chlorhexidine Gluconate Cloth 2 % PADS 6 each, 6 each, Topical, Q0600, Murlean Iba, MD, 6 each at 01/27/19 0617   fentaNYL (SUBLIMAZE) 100 MCG/2ML injection, , , ,    gabapentin (NEURONTIN) capsule 300 mg, 300 mg, Oral, Daily, Pyreddy, Pavan, MD, 300 mg at 01/26/19 2226   glipiZIDE (GLUCOTROL) tablet 5 mg, 5 mg, Oral, QAC breakfast, Pyreddy, Pavan, MD   Heparin (Porcine) in NaCl 1000-0.9 UT/500ML-% SOLN, , , ,    heparin 10000 UNIT/ML injection, , , ,    HYDROcodone-acetaminophen (NORCO/VICODIN) 5-325 MG per tablet 1-2 tablet, 1-2 tablet, Oral, Q4H PRN, Pyreddy, Pavan, MD   insulin aspart (novoLOG) injection 0-5 Units, 0-5 Units, Subcutaneous, QHS, Pyreddy, Pavan, MD, 2 Units at 01/26/19 2227   insulin aspart (novoLOG) injection 0-9 Units, 0-9 Units, Subcutaneous, TID WC, Pyreddy, Pavan, MD   insulin aspart (novoLOG) injection 12 Units, 12 Units, Subcutaneous, TID WC, Pyreddy, Pavan, MD   insulin detemir (LEVEMIR) injection 20 Units, 20 Units, Subcutaneous, Daily, Pyreddy, Pavan, MD, 20 Units at 01/26/19 2227   lidocaine-EPINEPHrine (XYLOCAINE-EPINEPHrine) 1 %-1:200000 (PF) injection, , , ,    metoprolol tartrate (LOPRESSOR) tablet 12.5 mg, 12.5 mg, Oral, BID,  Saundra Shelling, MD, 12.5 mg at 01/26/19 2225   midazolam (VERSED) 5 MG/5ML injection, , , ,    midodrine (PROAMATINE) tablet 10 mg, 10 mg, Oral, BID WC, Pyreddy, Pavan, MD, 10 mg at 01/27/19 1155   mirtazapine (REMERON) tablet 15 mg, 15 mg, Oral, QHS, Pyreddy, Pavan, MD, 15 mg at 01/26/19 2226   mometasone-formoterol (DULERA) 100-5 MCG/ACT inhaler 2 puff, 2 puff, Inhalation, Rodney Booze, Pyreddy, Reatha Harps, MD, 2 puff at 01/27/19 0612   ondansetron (ZOFRAN) tablet 4 mg, 4 mg, Oral, Q6H PRN **OR** ondansetron (ZOFRAN) injection 4 mg, 4 mg, Intravenous, Q6H PRN, Pyreddy, Pavan, MD   pantoprazole (PROTONIX) EC tablet 40 mg, 40 mg, Oral, Daily, Pyreddy, Pavan, MD, 40 mg at 01/26/19 2231    senna-docusate (Senokot-S) tablet 1 tablet, 1 tablet, Oral, QHS PRN, Pyreddy, Pavan, MD   sodium bicarbonate tablet 650 mg, 650 mg, Oral, BID, Pyreddy, Pavan, MD, 650 mg at 01/26/19 2226   sodium chloride flush (NS) 0.9 % injection 3 mL, 3 mL, Intravenous, Q12H, Pyreddy, Pavan, MD, 3 mL at 01/26/19 2228   sodium chloride flush (NS) 0.9 % injection 3 mL, 3 mL, Intravenous, PRN, Pyreddy, Pavan, MD   Allergies:  Codeine  Review of Systems: Gen:  Denies  fever, sweats, chills HEENT: Denies blurred vision, double vision. bleeds, sore throat Cvc:  No dizziness, chest pain. Resp:   Denies cough or sputum production, shortness of breath Gi: Denies swallowing difficulty, stomach pain. Gu:  Denies bladder incontinence, burning urine Ext:   No Joint pain, stiffness. Skin: No skin rash,  hives  Endoc:  No polyuria, polydipsia. Psych: No depression, insomnia. Other:  All other systems were reviewed with the patient and were negative other that what is mentioned in the HPI.   Physical Examination:   VS: BP (!) 88/56    Pulse 99    Temp (!) 97.5 F (36.4 C) (Oral)    Resp 10    Ht 5\' 10"  (1.778 m)    Wt 115.7 kg    SpO2 90%    BMI 36.60 kg/m   General Appearance: No distress  Neuro:without focal findings,  speech normal,  HEENT: PERRLA, EOM intact.   Pulmonary: normal breath sounds, No wheezing.  CardiovascularNormal S1,S2.  No m/r/g.   Abdomen: Benign, Soft, non-tender. Renal:  No costovertebral tenderness  GU:  No performed at this time. Endoc: No evident thyromegaly, no signs of acromegaly. Skin:   warm, no rashes, no ecchymosis  Extremities: normal, no cyanosis, clubbing.  Other findings:    LABORATORY PANEL:   CBC Recent Labs  Lab 01/27/19 0310  WBC 3.9*  HGB 9.9*  HCT 29.8*  PLT 139*   ------------------------------------------------------------------------------------------------------------------  Chemistries  Recent Labs  Lab 01/27/19 0310  NA 127*  K 5.0  CL  94*  CO2 17*  GLUCOSE 134*  BUN 95*  CREATININE 8.69*  CALCIUM 8.4*   ------------------------------------------------------------------------------------------------------------------  Cardiac Enzymes No results for input(s): TROPONINI in the last 168 hours. ------------------------------------------------------------  RADIOLOGY:  Dg Chest Portable 1 View  Result Date: 01/26/2019 CLINICAL DATA:  Bilateral lower extremity swelling. EXAM: PORTABLE CHEST 1 VIEW COMPARISON:  Radiograph of January 23, 2019. FINDINGS: The heart size and mediastinal contours are within normal limits. Right internal jugular dialysis catheter is unchanged in position. No pneumothorax is noted. Left lung is clear. Stable right basilar opacity is noted concerning for atelectasis or scarring with possible associated pleural effusion. The visualized skeletal structures are unremarkable. IMPRESSION: Stable right basilar opacity is noted as  described above, concerning for scarring or atelectasis with possible small pleural effusion. No significant changes noted compared to prior exam. Electronically Signed   By: Marijo Conception M.D.   On: 01/26/2019 11:01       Thank  you for the consultation and for allowing [facility] to assist in the care of your patient. Our recommendations are noted above.  Please contact us if we can be of further service.   Marda Stalker, M.D., F.C.C.P.  Board Certified in Internal Medicine, Pulmonary Medicine, [place], and Sleep Medicine.  [facility] Office Number: [phone]   01/27/2019

## 2019-01-27 NOTE — H&P (Signed)
[facility] History & Physical Update  The patient was interviewed and re-examined.  The patient's previous History and Physical has been reviewed and is unchanged.  There is no change in the plan of care. We plan to proceed with the scheduled procedure.  Hortencia Pilar, MD  01/27/2019, 8:09 AM

## 2019-01-27 NOTE — Progress Notes (Signed)
[facility], [place] 01/27/19  Subjective:   Patient known to our practice from outpatient dialysis Presents because dialysis cathter would not function in outpatient setting despite tPA infusion on Friday Underwent new permcath placement on Monday which also did not work because of clot in SVC tPA thrombolysis of tPA was attempted without success Therefore, a right femoral dialysis cathter was placed   Objective:  Vital signs in last 24 hours:  Temp:  [97.5 F (36.4 C)-97.6 F (36.4 C)] 97.6 F (36.4 C) (04/21 1019) Pulse Rate:  [84-107] 89 (04/21 1035) Resp:  [8-29] 11 (04/21 1035) BP: (108-182)/(64-122) 138/83 (04/21 1035) SpO2:  [94 %-100 %] 95 % (04/21 1035) Weight:  [108.9 kg-115.7 kg] 115.7 kg (04/21 1019)  Weight change:  Filed Weights   01/26/19 1026 01/26/19 1415 01/27/19 1019  Weight: 108.9 kg 108.9 kg 115.7 kg    Intake/Output:    Intake/Output Summary (Last 24 hours) at 01/27/2019 1045 Last data filed at 01/27/2019 0733 Gross per 24 hour  Intake 0 ml  Output 925 ml  Net -925 ml     Physical Exam: General: Well appearing  HEENT anicteric  Neck Supple  Pulm/lungs Clear, normal effort on room air  CVS/Heart No rub  Abdomen:  Soft, NT, non distended  Extremities: + edema b/l  Neurologic: Alert, oriented  Skin: No acute rashes  Access: Rt IJ PC, Rt femoral permcath       Basic Metabolic Panel:  Recent Labs  Lab 01/23/19 1052 01/26/19 1052 01/27/19 0310  NA 121* 122* 127*  K 5.9* 5.4* 5.0  CL 87* 87* 94*  CO2 16* 14* 17*  GLUCOSE 227* 176* 134*  BUN 76* 91* 95*  CREATININE 7.15* 8.43* 8.69*  CALCIUM 8.5* 8.9 8.4*     CBC: Recent Labs  Lab 01/23/19 1052 01/26/19 1052 01/27/19 0310  WBC 3.6* 3.1* 3.9*  NEUTROABS 2.1 1.8  --   HGB 10.8* 11.9* 9.9*  HCT 32.2* 35.5* 29.8*  MCV 90.4 90.6 91.7  PLT 135* 175 139*      Lab Results  Component Value Date   HEPBSAG Negative 10/17/2018   HEPBSAB Non  Reactive 02/28/2017   HEPBIGM Negative 02/28/2017      Microbiology:  Recent Results (from the past 240 hour(s))  MRSA PCR Screening     Status: None   Collection Time: 01/26/19 10:35 PM  Result Value Ref Range Status   MRSA by PCR NEGATIVE NEGATIVE Final    Comment:        The GeneXpert MRSA Assay (FDA approved for NASAL specimens only), is one component of a comprehensive MRSA colonization surveillance program. It is not intended to diagnose MRSA infection nor to guide or monitor treatment for MRSA infections. Performed at [facility] [address] [place], [place] [address]     Coagulation Studies: Recent Labs    01/26/19 1052  LABPROT 12.5  INR 0.9    Urinalysis: No results for input(s): COLORURINE, LABSPEC, PHURINE, GLUCOSEU, HGBUR, BILIRUBINUR, KETONESUR, PROTEINUR, UROBILINOGEN, NITRITE, LEUKOCYTESUR in the last 72 hours.  Invalid input(s): APPERANCEUR    Imaging: Dg Chest Portable 1 View  Result Date: 01/26/2019 CLINICAL DATA:  Bilateral lower extremity swelling. EXAM: PORTABLE CHEST 1 VIEW COMPARISON:  Radiograph of January 23, 2019. FINDINGS: The heart size and mediastinal contours are within normal limits. Right internal jugular dialysis catheter is unchanged in position. No pneumothorax is noted. Left lung is clear. Stable right basilar opacity is noted concerning for atelectasis or  scarring with possible associated pleural effusion. The visualized skeletal structures are unremarkable. IMPRESSION: Stable right basilar opacity is noted as described above, concerning for scarring or atelectasis with possible small pleural effusion. No significant changes noted compared to prior exam. Electronically Signed   By: Marijo Conception M.D.   On: 01/26/2019 11:01     Medications:   . amitriptyline  25 mg Oral QHS  . calcium acetate  1,334 mg Oral TID WC  . Chlorhexidine Gluconate Cloth  6 each Topical Q0600  . fentaNYL      . gabapentin  300 mg Oral  Daily  . glipiZIDE  5 mg Oral QAC breakfast  . Heparin (Porcine) in NaCl      . heparin      . insulin aspart  0-5 Units Subcutaneous QHS  . insulin aspart  0-9 Units Subcutaneous TID WC  . insulin aspart  12 Units Subcutaneous TID WC  . insulin detemir  20 Units Subcutaneous Daily  . lidocaine-EPINEPHrine      . metoprolol tartrate  12.5 mg Oral BID  . midazolam      . midodrine  10 mg Oral BID WC  . mirtazapine  15 mg Oral QHS  . mometasone-formoterol  2 puff Inhalation BH-[id number]  . pantoprazole  40 mg Oral Daily  . sodium bicarbonate  650 mg Oral BID  . sodium chloride flush  3 mL Intravenous Q12H     Assessment/ Plan:  58 y.o. male with ESRD hypertension, diabetes mellitus type 2 insulin dependent, hyperlipidemia, GERD, and morbid obesity  [facility]/112kg.  1. Complication of dialysis access 2. Hyperkalemia 3. LE edema 4. ESRD, dialysis dependent 5. SVC  Thrombosis 6. 5 cm lung mass in posterior rt lung- atelectasis vs mass- will consult pulmonologist   Plan: Dialysis today via [place] for cardiac clearance while inpatient and then rt arm HERO graft Patient is also interested in learning about PD Calcium acetate for phos binding EPO with HD Heparin and subsequently Eliquis for anticoagulation     LOS: [place] 4/21/202010:45 AM  [place], [place]  Note: This note was prepared with Dragon dictation. Any transcription errors are unintentional

## 2019-01-27 NOTE — Consult Note (Addendum)
[facility]  CARDIOLOGY CONSULT NOTE  Patient ID: Steven [name] MRN: [id number] DOB/AGE: 06/28/1961 58 y.o.  Admit date: 01/26/2019 Referring Physician - Nicholes Mango, MD Primary Physician - Stoney Bang, MD Primary Cardiologist - None Reason for Consultation - Pre-Op Clearance  HPI:  Steven [name] is a 58 y.o. with a past medical history of HTN, diabetes, and end stage renal disease on hemodialysis, who is scheduled to undergo AV fistula placement, now requiring cardiac clearance. Patient reports no recent chest pain. He has very limited physical activity and reports some dyspnea with exertion, however, this has been stable without any recent worsening or change. He does report lower extremity swelling, which has been worse recently. No leg pain or claudication reported.   Review of systems complete and found to be negative unless listed above   Past Medical History:  Diagnosis Date  . Anemia   . Cellulitis and abscess of right lower extremity 03/01/2017  . Chronic kidney disease    dialysis m,w,f  . Diabetes mellitus without complication ([place])   . Edema extremities 03/01/2017   bilateral swelling  . GERD (gastroesophageal reflux disease)   . High cholesterol   . High triglycerides   . Hypertension   . Neuropathy   . Sleep apnea    can't use cpap d/t feelings of suffocation    Past Surgical History:  Procedure Laterality Date  . A/V FISTULAGRAM Right 08/27/2017   Procedure: A/V FISTULAGRAM;  Surgeon: Katha Cabal, MD;  Location: [place] [facility];  Service: Cardiovascular;  Laterality: Right;  . A/V FISTULAGRAM Right 11/04/2018   Procedure: A/V FISTULAGRAM;  Surgeon: Katha Cabal, MD;  Location: [place] [facility];  Service: Cardiovascular;  Laterality: Right;  . A/V SHUNT INTERVENTION N/A 06/23/2018   Procedure: A/V SHUNT INTERVENTION;  Surgeon: Algernon Huxley, MD;  Location: [place] [facility];  Service: Cardiovascular;  Laterality: N/A;  .  APPENDECTOMY  2010  . AV FISTULA PLACEMENT Right 06/14/2017   Procedure: ARTERIOVENOUS (AV) FISTULA CREATION WRIST;  Surgeon: Katha Cabal, MD;  Location: [facility];  Service: Vascular;  Laterality: Right;  . DIALYSIS/PERMA CATHETER INSERTION N/A 03/05/2017   Procedure: Dialysis/Perma Catheter Insertion;  Surgeon: Katha Cabal, MD;  Location: [place] [facility];  Service: Cardiovascular;  Laterality: N/A;  . DIALYSIS/PERMA CATHETER INSERTION N/A 09/20/2017   Procedure: DIALYSIS/PERMA CATHETER INSERTION;  Surgeon: Katha Cabal, MD;  Location: [place] [facility];  Service: Cardiovascular;  Laterality: N/A;  . DIALYSIS/PERMA CATHETER INSERTION N/A 01/26/2019   Procedure: DIALYSIS/PERMA CATHETER INSERTION/Exchange;  Surgeon: Algernon Huxley, MD;  Location: [place] [facility];  Service: Cardiovascular;  Laterality: N/A;  . DIALYSIS/PERMA CATHETER INSERTION N/A 01/27/2019   Procedure: DIALYSIS/PERMA CATHETER INSERTION;  Surgeon: Katha Cabal, MD;  Location: [place] [facility];  Service: Cardiovascular;  Laterality: N/A;  . EXCHANGE OF A DIALYSIS CATHETER  03/29/2017   Procedure: Exchange Of A Dialysis Catheter;  Surgeon: Katha Cabal, MD;  Location: [place] [facility];  Service: Cardiovascular;;  . IRRIGATION AND DEBRIDEMENT FOOT Right 03/01/2017   Procedure: IRRIGATION AND DEBRIDEMENT FOOT;  Surgeon: Albertine Patricia, DPM;  Location: [facility];  Service: Podiatry;  Laterality: Right;  application of wound vac  . PERIPHERAL VASCULAR THROMBECTOMY Right 06/18/2018   Procedure: PERIPHERAL VASCULAR THROMBECTOMY;  Surgeon: Algernon Huxley, MD;  Location: [place] [facility];  Service: Cardiovascular;  Laterality: Right;    Medications Prior to Admission  Medication Sig Dispense Refill Last  Dose  . amitriptyline (ELAVIL) 25 MG tablet Take 25 mg by mouth at bedtime.   01/25/2019 at 1700  . aspirin 325 MG tablet Take 325 mg by mouth daily.   01/25/2019 at 1700  . calcium acetate  (PHOSLO) 667 MG capsule Take 2 capsules (1,334 mg total) by mouth 3 (three) times daily with meals. 180 capsule 1 01/25/2019 at 1900  . clopidogrel (PLAVIX) 75 MG tablet Take 75 mg by mouth daily.   01/25/2019 at 1700  . furosemide (LASIX) 80 MG tablet Take 80 mg by mouth daily.   01/25/2019 at 1700  . gabapentin (NEURONTIN) 300 MG capsule Take 1 capsule (300 mg total) by mouth daily.   01/25/2019 at 1900  . glipiZIDE (GLUCOTROL) 5 MG tablet Take 5 mg by mouth daily before breakfast.   3 01/25/2019 at 1700  . glucose blood (ACCU-CHEK AVIVA) test strip Use as instructed.use to check blood sugar up to 3 times a day  Disp: 1 box 100 each 11 01/22/2019 at Unknown time  . insulin aspart (NOVOLOG) 100 UNIT/ML injection Inject 12 Units into the skin 3 (three) times daily with meals. (Patient taking differently: Inject 12-20 Units into the skin 3 (three) times daily as needed for high blood sugar. ) 10 mL 11 01/19/2019 at prn  . insulin detemir (LEVEMIR) 100 UNIT/ML injection Inject 0.2 mLs (20 Units total) into the skin daily.   01/24/2019 at 1900  . Insulin Pen Needle 31G X 8 MM MISC 1 Container by Does not apply route QID. qid--any brand 100 each 11 Taking  . Insulin Syringes, Disposable, U-100 1 ML MISC Use as directed with insulin 100 each 3 Taking  . Lancets (ACCU-CHEK MULTICLIX) lancets Use as instructed.  check blood sugar up to 3 times a day Disp:  1 box 100 each 11 Taking  . metoprolol tartrate (LOPRESSOR) 25 MG tablet Take 0.5 tablets (12.5 mg total) by mouth 2 (two) times daily. 60 tablet 1 01/25/2019 at 1700  . midodrine (PROAMATINE) 10 MG tablet Take 1 tablet (10 mg total) by mouth 2 (two) times daily with a meal. (Patient taking differently: Take 10 mg by mouth daily as needed (for low bp from dialysis). ) 60 tablet 1 prn at prn  . mirtazapine (REMERON) 15 MG tablet Take 15 mg by mouth at bedtime.   6 01/25/2019 at 1700  . mometasone-formoterol (DULERA) 100-5 MCG/ACT AERO Inhale 2 puffs into the lungs  every morning. 13 g 2 01/23/2019 at unknown  . omeprazole (PRILOSEC OTC) 20 MG tablet Take 1 tablet (20 mg total) by mouth daily. 30 tablet 1 01/25/2019 at 1700  . PROAIR HFA 108 (90 Base) MCG/ACT inhaler Inhale 2 puffs into the lungs every 4 (four) hours as needed for wheezing or shortness of breath.   0 prn at prn  . sodium bicarbonate 650 MG tablet Take 1 tablet (650 mg total) by mouth 2 (two) times daily. 60 tablet 1 01/25/2019 at 1700  . spironolactone (ALDACTONE) 25 MG tablet Take 25 mg by mouth daily.   01/25/2019 at 0800   Social History   Socioeconomic History  . Marital status: Divorced    Spouse name: Not on file  . Number of children: 1  . Years of education: Not on file  . Highest education level: Not on file  Occupational History  . Occupation: disability  Social Needs  . Financial resource strain: Not very hard  . Food insecurity:    Worry: Never true  Inability: Never true  . Transportation needs:    Medical: No    Non-medical: No  Tobacco Use  . Smoking status: Never Smoker  . Smokeless tobacco: Current User    Types: Snuff  Substance and Sexual Activity  . 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 Drug use: No  . Sexual activity: Never  Lifestyle  . Physical activity:    Days per week: Not on file    Minutes per session: Not on file  . Stress: Not at all  Relationships  . Social connections:    Talks on phone: More than three times a week    Gets together: Not on file    Attends religious service: Not on file    Active member of club or organization: Not on file    Attends meetings of clubs or organizations: Not on file    Relationship status: Not on file  . Intimate partner violence:    Fear of current or ex partner: No    Emotionally abused: No    Physically abused: No    Forced sexual activity: No  Other Topics Concern  . Not on file  Social History Narrative  . Not on file    Family History  Problem  Relation Age of Onset  . CAD Father   . Heart disease Father       Review of systems complete and found to be negative unless listed above    PHYSICAL EXAM  General: Obese, well developed, well nourished, in no acute distress HEENT:  Normocephalic and atramatic Neck:  No JVD.  Lungs: Clear bilaterally to auscultation and percussion. Heart: HRRR . Normal S1 and S2 without gallops or murmurs.  Extremities: Significant pitting edema to bilateral lower extremities. Pneumatic compression device in place.  Neuro: Alert and oriented X 3. Psych:  Good affect, responds appropriately  Labs:   Lab Results  Component Value Date   WBC 3.9 (L) 01/27/2019   HGB 9.9 (L) 01/27/2019   HCT 29.8 (L) 01/27/2019   MCV 91.7 01/27/2019   PLT 139 (L) 01/27/2019    Recent Labs  Lab 01/27/19 0310  NA 127*  K 5.0  CL 94*  CO2 17*  BUN 95*  CREATININE 8.69*  CALCIUM 8.4*  GLUCOSE 134*   Lab Results  Component Value Date   TROPONINI 0.05 (HH) 12/16/2018    Lab Results  Component Value Date   CHOL 307 (H) 12/07/2011   CHOL 170 06/28/2010   CHOL 187 10/25/2009   Lab Results  Component Value Date   HDL 45 12/07/2011   HDL 36 (L) 06/28/2010   HDL 37 (L) 10/25/2009   Lab Results  Component Value Date   [facility]  12/07/2011     Comment:       Not calculated due to Triglyceride >400. Suggest ordering Direct LDL (Unit Code: [phone]).   Total Cholesterol/HDL Ratio:CHD Risk                        Coronary Heart Disease Risk Table                                        Men       Women          1/2 Average Risk  3.4        3.3              Average Risk              5.0        4.4           2X Average Risk              9.6        7.1           3X Average Risk             23.4       11.0 Use the calculated Patient Ratio above and the CHD Risk table  to determine the patient's CHD Risk. ATP III Classification (LDL):       < 100        mg/dL         Optimal      100 - 129      mg/dL         Near or Above Optimal      130 - 159     mg/dL         Borderline High      160 - 189     mg/dL         High       > 190        mg/dL         Very High     LDLCALC 57 06/28/2010   LDLCALC 74 10/25/2009   Lab Results  Component Value Date   TRIG 787 (H) 12/07/2011   TRIG 384 (H) 06/28/2010   TRIG 380 (H) 10/25/2009   Lab Results  Component Value Date   CHOLHDL 6.8 12/07/2011   CHOLHDL 4.7 Ratio 06/28/2010   CHOLHDL 5.1 Ratio 10/25/2009   No results found for: LDLDIRECT    Radiology: Dg Chest Portable 1 View  Result Date: 01/26/2019 CLINICAL DATA:  Bilateral lower extremity swelling. EXAM: PORTABLE CHEST 1 VIEW COMPARISON:  Radiograph of January 23, 2019. FINDINGS: The heart size and mediastinal contours are within normal limits. Right internal jugular dialysis catheter is unchanged in position. No pneumothorax is noted. Left lung is clear. Stable right basilar opacity is noted concerning for atelectasis or scarring with possible associated pleural effusion. The visualized skeletal structures are unremarkable. IMPRESSION: Stable right basilar opacity is noted as described above, concerning for scarring or atelectasis with possible small pleural effusion. No significant changes noted compared to prior exam. Electronically Signed   By: Marijo Conception M.D.   On: 01/26/2019 11:01   Dg Chest Portable 1 View  Result Date: 01/23/2019 CLINICAL DATA:  Failed dialysis today. Hemodialysis catheter placed yesterday. Last hemodialysis 1 week ago per patient. EXAM: PORTABLE CHEST 1 VIEW COMPARISON:  Radiographs 12/15/2018 and 10/17/2018. FINDINGS: 1118 hours. New right IJ dialysis catheter extends to the level of the upper right atrium. The left IJ catheter has been removed. The heart size and mediastinal contours are stable with mild superior mediastinal widening corresponding with fat on prior CT. There is a small right pleural effusion and associated right basilar airspace disease, stable  from recent radiographs dating back to January. The left lung is clear. There is no pneumothorax. IMPRESSION: 1. The new right IJ dialysis catheter appears appropriately positioned. No pneumothorax. 2. Persistent right pleural effusion and right basilar airspace disease, not grossly changed over the last 3 months. Electronically Signed  By: Richardean Sale M.D.   On: 01/23/2019 11:43    ASSESSMENT AND PLAN:  Mr. Scheck is a 58 year old male with a history of diabetes, HTN, and ESRD on HD, who requires cardiac clearance prior to AV fistula placement. He his considered high risk for cardiovascular disease given his obesity, age, diabetes history, and hypertension. He reports no recent chest pain. No concerns for acute cardiac etiology, however, will evaluate for underlying cardiovascular disease and further risk stratify the patient prior to surgery with ECHO and stress test.   1. Risk stratification with ECHO and stress test - results pending  I agree with risk statification as per above  Serafina Royals  Signed: Hilbert Odor PA-C 01/27/2019, 11:40 AM

## 2019-01-27 NOTE — Progress Notes (Signed)
Post HD Assessment  1.3kg removed, pt tolerated well. BP improved after blood was returned.  Given Tylenol at end of tx for headache.     01/27/19 1402  Neurological  Level of Consciousness Alert  Orientation Level Oriented X4  Respiratory  Respiratory Pattern Regular  Chest Assessment Chest expansion symmetrical  Bilateral Breath Sounds Clear  Cardiac  Pulse Regular  Heart Sounds S1, S2  ECG Monitor Yes  Cardiac Rhythm NSR  Vascular  R Radial Pulse +2  L Radial Pulse +2  Integumentary  Integumentary (WDL) X  Skin Color Appropriate for ethnicity  Skin Condition Dry  Musculoskeletal  Musculoskeletal (WDL) X  Generalized Weakness Yes  GU Assessment  Genitourinary (WDL) X  Genitourinary Symptoms Other (Comment) (HD Pt)  Psychosocial  Psychosocial (WDL) WDL

## 2019-01-27 NOTE — Progress Notes (Signed)
Pre HD Assessment     01/27/19 1030  Neurological  Level of Consciousness Alert  Orientation Level Oriented X4  Respiratory  Respiratory Pattern Regular  Chest Assessment Chest expansion symmetrical  Bilateral Breath Sounds Clear  Cardiac  Pulse Regular  Heart Sounds S1, S2  ECG Monitor Yes  Cardiac Rhythm NSR  Vascular  R Radial Pulse +2  L Radial Pulse +2  Integumentary  Integumentary (WDL) X  Skin Color Appropriate for ethnicity  Skin Condition Dry  Musculoskeletal  Musculoskeletal (WDL) X  Generalized Weakness Yes  GU Assessment  Genitourinary (WDL) X  Genitourinary Symptoms Other (Comment) (HD Pt)  Psychosocial  Psychosocial (WDL) WDL

## 2019-01-27 NOTE — Progress Notes (Signed)
HD Tx End   01/27/19 1355  Vital Signs  Pulse Rate 98  Resp 15  BP 112/68  Oxygen Therapy  SpO2 94 %  During Hemodialysis Assessment  Blood Flow Rate (mL/min) 200 mL/min  Arterial Pressure (mmHg) -100 mmHg  Venous Pressure (mmHg) 90 mmHg  Transmembrane Pressure (mmHg) 50 mmHg  Ultrafiltration Rate (mL/min) 430 mL/min  Dialysate Flow Rate (mL/min) 800 ml/min  Conductivity: Machine  14  HD Safety Checks Performed Yes  Dialysis Fluid Bolus Normal Saline  Bolus Amount (mL) 250 mL  Intra-Hemodialysis Comments Tx completed

## 2019-01-27 NOTE — Progress Notes (Signed)
Hemodialysis- BP drop into 19W systolic. UF OFF. HOB decreased. 250cc saline bolus given. BP returned to 90s post bolus. Primary RN notified to send midodrine to dialysis unit asap for bp support. HOB remains decreased. CBG 129.  Patient currently states relief. Continue to monitor.

## 2019-01-28 ENCOUNTER — Inpatient Hospital Stay: Payer: Medicare Other

## 2019-01-28 DIAGNOSIS — Z992 Dependence on renal dialysis: Secondary | ICD-10-CM

## 2019-01-28 DIAGNOSIS — N186 End stage renal disease: Secondary | ICD-10-CM

## 2019-01-28 DIAGNOSIS — D631 Anemia in chronic kidney disease: Secondary | ICD-10-CM

## 2019-01-28 DIAGNOSIS — T8249XA Other complication of vascular dialysis catheter, initial encounter: Secondary | ICD-10-CM

## 2019-01-28 LAB — BASIC METABOLIC PANEL
Anion gap: 10 (ref 5–15)
BUN: 48 mg/dL — ABNORMAL HIGH (ref 6–20)
CO2: 25 mmol/L (ref 22–32)
Calcium: 8.2 mg/dL — ABNORMAL LOW (ref 8.9–10.3)
Chloride: 99 mmol/L (ref 98–111)
Creatinine, Ser: 6.5 mg/dL — ABNORMAL HIGH (ref 0.61–1.24)
GFR calc Af Amer: 10 mL/min — ABNORMAL LOW (ref >60)
GFR calc non Af Amer: 9 mL/min — ABNORMAL LOW (ref >60)
Glucose, Bld: 114 mg/dL — ABNORMAL HIGH (ref 70–99)
Potassium: 4.2 mmol/L (ref 3.5–5.1)
Sodium: 134 mmol/L — ABNORMAL LOW (ref 135–145)

## 2019-01-28 LAB — CBC
HCT: 27.7 % — ABNORMAL LOW (ref 39.0–52.0)
Hemoglobin: 8.9 g/dL — ABNORMAL LOW (ref 13.0–17.0)
MCH: 30.3 pg (ref 26.0–34.0)
MCHC: 32.1 g/dL (ref 30.0–36.0)
MCV: 94.2 fL (ref 80.0–100.0)
Platelets: 126 10*3/uL — ABNORMAL LOW (ref 150–400)
RBC: 2.94 MIL/uL — ABNORMAL LOW (ref 4.22–5.81)
RDW: 15.9 % — ABNORMAL HIGH (ref 11.5–15.5)
WBC: 2.8 10*3/uL — ABNORMAL LOW (ref 4.0–10.5)
nRBC: 0 % (ref 0.0–0.2)

## 2019-01-28 LAB — NM MYOCAR MULTI W/SPECT W/WALL MOTION / EF
Estimated workload: 1 METS
Exercise duration (min): 1 min
Exercise duration (sec): 19 s
LV dias vol: 55 mL (ref 62–150)
LV sys vol: 15 mL
Peak HR: 96 {beats}/min
Rest HR: 82 {beats}/min
SDS: 0
SRS: 1
SSS: 0
TID: 0.9

## 2019-01-28 LAB — GLUCOSE, CAPILLARY
Glucose-Capillary: 80 mg/dL (ref 70–99)
Glucose-Capillary: 81 mg/dL (ref 70–99)
Glucose-Capillary: 218 mg/dL — ABNORMAL HIGH (ref 70–99)
Glucose-Capillary: 185 mg/dL — ABNORMAL HIGH (ref 70–99)

## 2019-01-28 LAB — HEPARIN LEVEL (UNFRACTIONATED)
Heparin Unfractionated: 0.49 IU/mL (ref 0.30–0.70)
Heparin Unfractionated: 0.52 IU/mL (ref 0.30–0.70)

## 2019-01-28 LAB — ECHOCARDIOGRAM COMPLETE
Height: 70 in
Weight: 4031.77 oz

## 2019-01-28 MED ORDER — INSULIN ASPART 100 UNIT/ML ~~LOC~~ SOLN
6.0000 [IU] | Freq: Three times a day (TID) | SUBCUTANEOUS | Status: DC
  Administered 2019-01-28 – 2019-01-31 (×5): 6 [IU] via SUBCUTANEOUS
  Filled 2019-01-28 (×5): qty 1

## 2019-01-28 MED ORDER — INSULIN DETEMIR 100 UNIT/ML ~~LOC~~ SOLN
18.0000 [IU] | Freq: Every day | SUBCUTANEOUS | Status: DC
  Administered 2019-01-28 – 2019-01-30 (×2): 18 [IU] via SUBCUTANEOUS
  Filled 2019-01-28 (×4): qty 0.18

## 2019-01-28 MED ORDER — TECHNETIUM TC 99M TETROFOSMIN IV KIT
11.3200 | PACK | Freq: Once | INTRAVENOUS | Status: AC | PRN
  Administered 2019-01-28: 11.32 via INTRAVENOUS

## 2019-01-28 MED ORDER — TECHNETIUM TC 99M TETROFOSMIN IV KIT
30.0000 | PACK | Freq: Once | INTRAVENOUS | Status: AC | PRN
  Administered 2019-01-28: 31.67 via INTRAVENOUS

## 2019-01-28 MED ORDER — REGADENOSON 0.4 MG/5ML IV SOLN
0.4000 mg | Freq: Once | INTRAVENOUS | Status: AC
  Administered 2019-01-28: 0.4 mg via INTRAVENOUS

## 2019-01-28 NOTE — Progress Notes (Signed)
Per Dr. Candiss Norse and Dr Margaretmary Eddy, pt is okay to get out of bed and walk.

## 2019-01-28 NOTE — Progress Notes (Signed)
[facility] Cardiology [facility] Encounter Note  Patient: Steven [name] / Admit Date: 01/26/2019 / Date of Encounter: 01/28/2019, 2:07 PM   Subjective: Patient continues to be shortness of breath with any physical activity as well as weakness but no evidence of chest discomfort and/or congestive heart failure type treatment.  Patient had vascular access without complications for dialysis. Echocardiogram showing normal LV systolic function with ejection fraction of 65% and no evidence of significant valvular heart disease Lexiscan infusion stress test showing normal myocardial perfusion without evidence of myocardial ischemia  Review of Systems: Positive for: Shortness of breath weakness Negative for: Vision change, hearing change, syncope, dizziness, nausea, vomiting,diarrhea, bloody stool, stomach pain, cough, congestion, diaphoresis, urinary frequency, urinary pain,skin lesions, skin rashes Others previously listed  Objective: Telemetry: Normal sinus rhythm Physical Exam: Blood pressure 104/90, pulse 83, temperature 97.7 F (36.5 C), temperature source Oral, resp. rate 16, height 5\' 10"  (1.778 m), weight 114.3 kg, SpO2 97 %. Body mass index is 36.16 kg/m. General: Well developed, well nourished, in no acute distress. Head: Normocephalic, atraumatic, sclera non-icteric, no xanthomas, nares are without discharge. Neck: No apparent masses Lungs: Normal respirations with no wheezes, no rhonchi, no rales , few crackles   Heart: Regular rate and rhythm, normal S1 S2, no murmur, no rub, no gallop, PMI is normal size and placement, carotid upstroke normal without bruit, jugular venous pressure normal Abdomen: Soft, non-tender, distended with normoactive bowel sounds. No hepatosplenomegaly. Abdominal aorta is normal size without bruit Extremities: Trace to 1+ edema, no clubbing, no cyanosis, no ulcers,  Peripheral: 2+ radial, 2+ femoral, 2+ dorsal pedal pulses Neuro: Alert and oriented.  Moves all extremities spontaneously. Psych:  Responds to questions appropriately with a normal affect.   Intake/Output Summary (Last 24 hours) at 01/28/2019 1407 Last data filed at 01/28/2019 1300 Gross per 24 hour  Intake 863.39 ml  Output 150 ml  Net 713.39 ml    Inpatient Medications:  . amitriptyline  25 mg Oral QHS  . calcium acetate  1,334 mg Oral TID WC  . Chlorhexidine Gluconate Cloth  6 each Topical Q0600  . gabapentin  300 mg Oral Daily  . insulin aspart  0-5 Units Subcutaneous QHS  . insulin aspart  0-9 Units Subcutaneous TID WC  . insulin aspart  6 Units Subcutaneous TID WC  . insulin detemir  18 Units Subcutaneous Daily  . metoprolol tartrate  12.5 mg Oral BID  . midodrine  10 mg Oral BID WC  . mirtazapine  15 mg Oral QHS  . mometasone-formoterol  2 puff Inhalation BH-q7a  . pantoprazole  40 mg Oral Daily  . sodium bicarbonate  650 mg Oral BID  . sodium chloride flush  3 mL Intravenous Q12H   Infusions:  . sodium chloride    . heparin 1,650 Units/hr (01/28/19 0850)    Labs: Recent Labs    01/27/19 0310 01/28/19 0601  NA 127* 134*  K 5.0 4.2  CL 94* 99  CO2 17* 25  GLUCOSE 134* 114*  BUN 95* 48*  CREATININE 8.69* 6.50*  CALCIUM 8.4* 8.2*   No results for input(s): AST, ALT, ALKPHOS, BILITOT, PROT, ALBUMIN in the last 72 hours. Recent Labs    01/26/19 1052 01/27/19 0310 01/28/19 0601  WBC 3.1* 3.9* 2.8*  NEUTROABS 1.8  --   --   HGB 11.9* 9.9* 8.9*  HCT 35.5* 29.8* 27.7*  MCV 90.6 91.7 94.2  PLT 175 139* 126*   No results for input(s): CKTOTAL, CKMB, TROPONINI  in the last 72 hours. Invalid input(s): POCBNP No results for input(s): HGBA1C in the last 72 hours.   Weights: Filed Weights   01/26/19 1415 01/27/19 1019 01/27/19 1400  Weight: 108.9 kg 115.7 kg 114.3 kg     Radiology/Studies:  Ct Chest Wo Contrast  Result Date: 01/27/2019 CLINICAL DATA:  58 year old male with pleural based lung mass EXAM: CT CHEST WITHOUT CONTRAST TECHNIQUE:  Multidetector CT imaging of the chest was performed following the standard protocol without IV contrast. COMPARISON:  Abdominal CT 10/17/2018 FINDINGS: Cardiovascular: Heart size within normal limits. No pericardial fluid/thickening. Calcifications of the left main, left anterior descending, circumflex, right coronary arteries. Unremarkable course caliber and contour of the thoracic aorta. Main pulmonary artery measures 3.9 cm. No significant enlargement of the pulmonary arteries as they extend toward the outer 1/3 of the lung. Mediastinum/Nodes: Small lymph nodes of the mediastinum. No hilar or subcarinal lymph nodes. Unremarkable course of the thoracic esophagus. Hemodialysis catheter from IJ approach terminates in the upper right atrium. There is a second hemodialysis catheter terminating in the right atrium from a femoral approach. This was not present on the prior plain film. Lungs/Pleura: Rounded soft tissue at the posterior aspect of the right lower lobe, 4.2 cm on axial images, 5.8 cm on parasagittal images, with similar configuration to the comparison abdominal CT. There is associated volume loss/atelectasis with some focal calcifications within the adjacent lung parenchyma. Trace right-sided pleural fluid/thickening, unchanged from the comparison. No pneumothorax. No confluent airspace disease of the right upper lobe or the left lung. No endotracheal or endobronchial debris. Upper Abdomen: No acute finding of the upper abdomen. Liver steatosis Musculoskeletal: No acute displaced fracture. Sclerotic changes at the superior endplate of T6 with no acute fracture line identified. Partially healed rib fractures on the left of anterior fifth, seventh, eighth ribs partially healed right-sided rib fractures of 8 and 9. IMPRESSION: Rounded soft tissue mass of the right lower lobe which has not changed significantly in size compared to the prior abdominal CT. Differential again includes both malignancy and rounded  atelectasis. Follow-up/surveillance imaging to consider would be both contrast-enhanced CT versus PET-CT. Coronary artery disease. Electronically Signed   By: Corrie Mckusick D.O.   On: 01/27/2019 15:41   Nm Myocar Multi W/spect W/wall Motion / Ef  Result Date: 01/28/2019  The study is normal.  This is a low risk study.  The left ventricular ejection fraction is normal (55-65%).  Blood pressure demonstrated a normal response to exercise.  There was no ST segment deviation noted during stress.    Dg Chest Portable 1 View  Result Date: 01/26/2019 CLINICAL DATA:  Bilateral lower extremity swelling. EXAM: PORTABLE CHEST 1 VIEW COMPARISON:  Radiograph of January 23, 2019. FINDINGS: The heart size and mediastinal contours are within normal limits. Right internal jugular dialysis catheter is unchanged in position. No pneumothorax is noted. Left lung is clear. Stable right basilar opacity is noted concerning for atelectasis or scarring with possible associated pleural effusion. The visualized skeletal structures are unremarkable. IMPRESSION: Stable right basilar opacity is noted as described above, concerning for scarring or atelectasis with possible small pleural effusion. No significant changes noted compared to prior exam. Electronically Signed   By: Marijo Conception M.D.   On: 01/26/2019 11:01   Dg Chest Portable 1 View  Result Date: 01/23/2019 CLINICAL DATA:  Failed dialysis today. Hemodialysis catheter placed yesterday. Last hemodialysis 1 week ago per patient. EXAM: PORTABLE CHEST 1 VIEW COMPARISON:  Radiographs 12/15/2018 and 10/17/2018.  FINDINGS: 1118 hours. New right IJ dialysis catheter extends to the level of the upper right atrium. The left IJ catheter has been removed. The heart size and mediastinal contours are stable with mild superior mediastinal widening corresponding with fat on prior CT. There is a small right pleural effusion and associated right basilar airspace disease, stable from recent  radiographs dating back to January. The left lung is clear. There is no pneumothorax. IMPRESSION: 1. The new right IJ dialysis catheter appears appropriately positioned. No pneumothorax. 2. Persistent right pleural effusion and right basilar airspace disease, not grossly changed over the last 3 months. Electronically Signed   By: Richardean Sale M.D.   On: 01/23/2019 11:43     Assessment and Recommendation  58 y.o. male with essential hypertension mixed hyperlipidemia diabetes with complication with end-stage renal disease and apparent lung mass of unknown etiology with a normal stress test without evidence of myocardial ischemia and an echocardiogram showing normal LV systolic function with no evidence of significant valvular heart disease and no current evidence of congestive heart failure and/or anginal symptoms at lowest risk possible for further cardiovascular complication with surgical intervention including fistula surgery and or lung biopsy. 1.  No further cardiac intervention and/or diagnostic testing at this time with normal stress test and normal echocardiogram 2.  Continue aggressive medication management for risk reduction in cardiovascular event in the future with hypertension control, high intensity cholesterol therapy, and diabetes treatment. 3.  No restrictions to further intervention and/or rehabilitation  Signed, Serafina Royals M.D. FACC

## 2019-01-28 NOTE — Progress Notes (Signed)
[facility], [place] 01/28/19  Subjective:   Patient known to our practice from outpatient dialysis Presents because dialysis cathter would not function in outpatient setting despite tPA infusion on Friday Underwent new permcath placement on Monday which also did not work because of clot in SVC tPA thrombolysis of SVC clot was attempted without success Therefore, a right femoral dialysis cathter was placed Patient underwent dialysis on Tuesday. Underwent stress test today.  Feels well.  Able to eat without nausea or vomiting.   Objective:  Vital signs in last 24 hours:  Temp:  [97.8 F (36.6 C)-98.4 F (36.9 C)] 98.4 F (36.9 C) (04/22 0357) Pulse Rate:  [92-119] 95 (04/22 0357) Resp:  [10-21] 17 (04/22 0357) BP: (88-120)/(56-81) 113/70 (04/22 0357) SpO2:  [92 %-96 %] 96 % (04/22 0357) Weight:  [114.3 kg] 114.3 kg (04/21 1400)  Weight change: 6.837 kg Filed Weights   01/26/19 1415 01/27/19 1019 01/27/19 1400  Weight: 108.9 kg 115.7 kg 114.3 kg    Intake/Output:    Intake/Output Summary (Last 24 hours) at 01/28/2019 1307 Last data filed at 01/28/2019 0400 Gross per 24 hour  Intake 623.39 ml  Output 1450 ml  Net -826.61 ml     Physical Exam: General: Well appearing  HEENT anicteric  Neck Supple  Pulm/lungs Clear, normal effort on room air  CVS/Heart No rub  Abdomen:  Soft, NT, non distended  Extremities: + edema b/l  Neurologic: Alert, oriented  Skin: No acute rashes  Access: Rt IJ PC, Rt femoral permcath       Basic Metabolic Panel:  Recent Labs  Lab 01/23/19 1052 01/26/19 1052 01/27/19 0310 01/28/19 0601  NA 121* 122* 127* 134*  K 5.9* 5.4* 5.0 4.2  CL 87* 87* 94* 99  CO2 16* 14* 17* 25  GLUCOSE 227* 176* 134* 114*  BUN 76* 91* 95* 48*  CREATININE 7.15* 8.43* 8.69* 6.50*  CALCIUM 8.5* 8.9 8.4* 8.2*     CBC: Recent Labs  Lab 01/23/19 1052 01/26/19 1052 01/27/19 0310 01/28/19 0601  WBC 3.6* 3.1* 3.9* 2.8*   NEUTROABS 2.1 1.8  --   --   HGB 10.8* 11.9* 9.9* 8.9*  HCT 32.2* 35.5* 29.8* 27.7*  MCV 90.4 90.6 91.7 94.2  PLT 135* 175 139* 126*      Lab Results  Component Value Date   HEPBSAG Negative 10/17/2018   HEPBSAB Non Reactive 02/28/2017   HEPBIGM Negative 02/28/2017      Microbiology:  Recent Results (from the past 240 hour(s))  MRSA PCR Screening     Status: None   Collection Time: 01/26/19 10:35 PM  Result Value Ref Range Status   MRSA by PCR NEGATIVE NEGATIVE Final    Comment:        The GeneXpert MRSA Assay (FDA approved for NASAL specimens only), is one component of a comprehensive MRSA colonization surveillance program. It is not intended to diagnose MRSA infection nor to guide or monitor treatment for MRSA infections. Performed at [facility], [address] [place], [place] [address]     Coagulation Studies: Recent Labs    01/26/19 1052  LABPROT 12.5  INR 0.9    Urinalysis: No results for input(s): COLORURINE, LABSPEC, PHURINE, GLUCOSEU, HGBUR, BILIRUBINUR, KETONESUR, PROTEINUR, UROBILINOGEN, NITRITE, LEUKOCYTESUR in the last 72 hours.  Invalid input(s): APPERANCEUR    Imaging: Ct Chest Wo Contrast  Result Date: 01/27/2019 CLINICAL DATA:  58 year old male with pleural based lung mass EXAM: CT CHEST WITHOUT CONTRAST TECHNIQUE: Multidetector  CT imaging of the chest was performed following the standard protocol without IV contrast. COMPARISON:  Abdominal CT 10/17/2018 FINDINGS: Cardiovascular: Heart size within normal limits. No pericardial fluid/thickening. Calcifications of the left main, left anterior descending, circumflex, right coronary arteries. Unremarkable course caliber and contour of the thoracic aorta. Main pulmonary artery measures 3.9 cm. No significant enlargement of the pulmonary arteries as they extend toward the outer 1/3 of the lung. Mediastinum/Nodes: Small lymph nodes of the mediastinum. No hilar or subcarinal lymph nodes.  Unremarkable course of the thoracic esophagus. Hemodialysis catheter from IJ approach terminates in the upper right atrium. There is a second hemodialysis catheter terminating in the right atrium from a femoral approach. This was not present on the prior plain film. Lungs/Pleura: Rounded soft tissue at the posterior aspect of the right lower lobe, 4.2 cm on axial images, 5.8 cm on parasagittal images, with similar configuration to the comparison abdominal CT. There is associated volume loss/atelectasis with some focal calcifications within the adjacent lung parenchyma. Trace right-sided pleural fluid/thickening, unchanged from the comparison. No pneumothorax. No confluent airspace disease of the right upper lobe or the left lung. No endotracheal or endobronchial debris. Upper Abdomen: No acute finding of the upper abdomen. Liver steatosis Musculoskeletal: No acute displaced fracture. Sclerotic changes at the superior endplate of T6 with no acute fracture line identified. Partially healed rib fractures on the left of anterior fifth, seventh, eighth ribs partially healed right-sided rib fractures of 8 and 9. IMPRESSION: Rounded soft tissue mass of the right lower lobe which has not changed significantly in size compared to the prior abdominal CT. Differential again includes both malignancy and rounded atelectasis. Follow-up/surveillance imaging to consider would be both contrast-enhanced CT versus PET-CT. Coronary artery disease. Electronically Signed   By: Corrie Mckusick D.O.   On: 01/27/2019 15:41     Medications:   . amitriptyline  25 mg Oral QHS  . calcium acetate  1,334 mg Oral TID WC  . Chlorhexidine Gluconate Cloth  6 each Topical Q0600  . gabapentin  300 mg Oral Daily  . insulin aspart  0-5 Units Subcutaneous QHS  . insulin aspart  0-9 Units Subcutaneous TID WC  . insulin aspart  6 Units Subcutaneous TID WC  . insulin detemir  18 Units Subcutaneous Daily  . metoprolol tartrate  12.5 mg Oral BID   . midodrine  10 mg Oral BID WC  . mirtazapine  15 mg Oral QHS  . mometasone-formoterol  2 puff Inhalation BH-q7a  . pantoprazole  40 mg Oral Daily  . sodium bicarbonate  650 mg Oral BID  . sodium chloride flush  3 mL Intravenous Q12H     Assessment/ Plan:  58 y.o. Caucasian male with ESRD hypertension, diabetes mellitus type 2 insulin dependent, hyperlipidemia, GERD, and morbid obesity  [facility]/MWF/112kg.  1. Complication of dialysis access 2. Hyperkalemia 3. LE edema 4. ESRD, dialysis dependent 5. SVC  Thrombosis 6. 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  Anemia of chronic kidney disease   Plan: Dialysis today via rt fem permcath Plan for cardiac clearance while inpatient and then rt arm HERO graft placement on Friday Patient is also interested in learning about PD Calcium acetate for phos binding EPO with HD Heparin and subsequently Eliquis for anticoagulation Repeat CT chest shows no change in the mass.     LOS: 2 Cabella Kimm 4/22/20201:07 PM  [place], [place]  Note: This note was prepared  with Sales executive. Any transcription errors are unintentional

## 2019-01-28 NOTE — Consult Note (Signed)
ANTICOAGULATION CONSULT NOTE - Initial Consult  Pharmacy Consult for heparin drip management Indication: VTE prophylaxis  Patient Measurements: Height: 5\' 10"  (177.8 cm) Weight: 251 lb 15.8 oz (114.3 kg) IBW/kg (Calculated) : 73 Heparin Dosing Weight: 96.5 kg  Vital Signs: Temp: 97.8 F (36.6 C) (04/21 2100) Temp Source: Oral (04/21 2100) BP: 110/77 (04/21 2100) Pulse Rate: 119 (04/21 2100)  Labs: Recent Labs    01/26/19 1052 01/27/19 0310 01/27/19 1448 01/28/19 0011  HGB 11.9* 9.9*  --   --   HCT 35.5* 29.8*  --   --   PLT 175 139*  --   --   APTT  --   --  27  --   LABPROT 12.5  --   --   --   INR 0.9  --   --   --   HEPARINUNFRC  --   --   --  0.52  CREATININE 8.43* 8.69*  --   --     Estimated Creatinine Clearance: 11.9 mL/min (A) (by C-G formula based on SCr of 8.69 mg/dL (H)).   Medical History: Past Medical History:  Diagnosis Date  . Anemia   . Cellulitis and abscess of right lower extremity 03/01/2017  . Chronic kidney disease    dialysis m,w,f  . Diabetes mellitus without complication ([place])   . Edema extremities 03/01/2017   bilateral swelling  . GERD (gastroesophageal reflux disease)   . High cholesterol   . High triglycerides   . Hypertension   . Neuropathy   . Sleep apnea    can't use cpap d/t feelings of suffocation    Medications:  Scheduled:  . amitriptyline  25 mg Oral QHS  . calcium acetate  1,334 mg Oral TID WC  . Chlorhexidine Gluconate Cloth  6 each Topical Q0600  . gabapentin  300 mg Oral Daily  . glipiZIDE  5 mg Oral QAC breakfast  . insulin aspart  0-5 Units Subcutaneous QHS  . insulin aspart  0-9 Units Subcutaneous TID WC  . insulin aspart  12 Units Subcutaneous TID WC  . insulin detemir  20 Units Subcutaneous Daily  . metoprolol tartrate  12.5 mg Oral BID  . midodrine  10 mg Oral BID WC  . mirtazapine  15 mg Oral QHS  . mometasone-formoterol  2 puff Inhalation BH-q7a  . pantoprazole  40 mg Oral Daily  . sodium  bicarbonate  650 mg Oral BID  . sodium chloride flush  3 mL Intravenous Q12H    Assessment: 58 y.o. male with a h/o ESRD on HD and was referred from dialysis center by nephrologist Dr. Candiss Norse.  Patient dialysis catheter was recently changed at the center but was nonfunctional. He has a temp cath in place and received HD today but needs a HERO graft which will be scheduled for Friday following cardiology clearance. He is on no chronic anticoagulation PTA. Hgb, PLT trending down: will continue to monitor, baseline INR 0.9, baseline aPTT on order  Goal of Therapy:  Heparin level 0.3-0.7 units/ml Monitor platelets by anticoagulation protocol: Yes   Plan:  04/22 @ 0000 HL 0.52 therapeutic. Will continue current rate and will recheck @ 0600.  Tobie Lords, PharmD, BCPS Clinical Pharmacist 01/28/2019
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
## 2019-01-28 NOTE — Progress Notes (Addendum)
Inpatient Diabetes Program Recommendations  AACE/ADA: New Consensus Statement on Inpatient Glycemic Control (2015)  Target Ranges:  Prepandial:   less than 140 mg/dL      Peak postprandial:   less than 180 mg/dL (1-2 hours)      Critically ill patients:  140 - 180 mg/dL   Lab Results  Component Value Date   GLUCAP 81 01/28/2019   HGBA1C 8.6 (H) 12/16/2018    Review of Glycemic Control Results for LEOTIS, ISHAM (MRN [id number]) as of 01/28/2019 12:01  Ref. Range 01/27/2019 11:09 01/27/2019 16:34 01/27/2019 17:29 01/27/2019 21:02 01/28/2019 07:45 01/28/2019 11:54  Glucose-Capillary Latest Ref Range: 70 - 99 mg/dL 142 (H) No insulin 223 (H)  265 (H) 5 units Novolog + 12 units of Novolog 189 (H)  Levemir 20 units q HS 80 81   Diabetes history: DM 2 Outpatient Diabetes medications:  Glucotrol 5 mg daily, Novolog 12-20 units tid prn, Levemir 20 units daily Current orders for Inpatient glycemic control:  Novolog sensitive tid with meals and HS, Glucotrol 5 mg daily, Novolog 12 units tid with meals, Levemir 20 units daily  Inpatient Diabetes Program Recommendations:    While in the hospital, consider holding Glucotrol.  Also please reduce Novolog meal coverage to 6 units tid with meals (hold if patient eats less than 50%).  Consider reducing Levemir to 18 units daily.   Thanks,  Adah Perl, RN, BC-ADM Inpatient Diabetes Coordinator Pager [phone] (8a-5p)

## 2019-01-28 NOTE — Progress Notes (Signed)
[place] [facility] [place] NAME: Steven [name]    MR#:  [id number]  DATE OF BIRTH:  [birth year]  SUBJECTIVE:  CHIEF COMPLAINT: Patient was seen and examined after stress test.  Hungry wants to eat  REVIEW OF SYSTEMS:  CONSTITUTIONAL: No fever, fatigue or weakness.  EYES: No blurred or double vision.  EARS, NOSE, AND THROAT: No tinnitus or ear pain.  RESPIRATORY: No cough, shortness of breath, wheezing or hemoptysis.  CARDIOVASCULAR: No chest pain, orthopnea, edema.  GASTROINTESTINAL: No nausea, vomiting, diarrhea or abdominal pain.  GENITOURINARY: No dysuria, hematuria.  ENDOCRINE: No polyuria, nocturia,  HEMATOLOGY: No anemia, easy bruising or bleeding SKIN: No rash or lesion. MUSCULOSKELETAL: No joint pain or arthritis.   NEUROLOGIC: No tingling, numbness, weakness.  PSYCHIATRY: No anxiety or depression.   DRUG ALLERGIES:   Allergies  Allergen Reactions  . Codeine Nausea And Vomiting    VITALS:  Blood pressure 113/70, pulse 95, temperature 98.4 F (36.9 C), temperature source Oral, resp. rate 17, height 5\' 10"  (1.778 m), weight 114.3 kg, SpO2 96 %.  PHYSICAL EXAMINATION:  GENERAL:  58 y.o.-year-old patient lying in the bed with no acute distress.  EYES: Pupils equal, round, reactive to light and accommodation. No scleral icterus. Extraocular muscles intact.  HEENT: Head atraumatic, normocephalic. Oropharynx and nasopharynx clear.  NECK:  Supple, no jugular venous distention. No thyroid enlargement, no tenderness.  LUNGS: Normal breath sounds bilaterally, no wheezing, rales,rhonchi or crepitation. No use of accessory muscles of respiration.  CARDIOVASCULAR: S1, S2 normal. No murmurs, rubs, or gallops.  ABDOMEN: Soft, nontender, nondistended. Bowel sounds present. EXTREMITIES: Right groin with new dialysis catheter no pedal edema, cyanosis, or clubbing.  NEUROLOGIC: Awake alert and oriented x3 sensation intact. Gait not checked.   PSYCHIATRIC: The patient is alert and oriented x 3.  SKIN: No obvious rash, lesion, or ulcer.    LABORATORY PANEL:   CBC Recent Labs  Lab 01/28/19 0601  WBC 2.8*  HGB 8.9*  HCT 27.7*  PLT 126*   ------------------------------------------------------------------------------------------------------------------  Chemistries  Recent Labs  Lab 01/28/19 0601  NA 134*  K 4.2  CL 99  CO2 25  GLUCOSE 114*  BUN 48*  CREATININE 6.50*  CALCIUM 8.2*   ------------------------------------------------------------------------------------------------------------------  Cardiac Enzymes No results for input(s): TROPONINI in the last 168 hours. ------------------------------------------------------------------------------------------------------------------  RADIOLOGY:  Ct Chest Wo Contrast  Result Date: 01/27/2019 CLINICAL DATA:  58 year old male with pleural based lung mass EXAM: CT CHEST WITHOUT CONTRAST TECHNIQUE: Multidetector CT imaging of the chest was performed following the standard protocol without IV contrast. COMPARISON:  Abdominal CT 10/17/2018 FINDINGS: Cardiovascular: Heart size within normal limits. No pericardial fluid/thickening. Calcifications of the left main, left anterior descending, circumflex, right coronary arteries. Unremarkable course caliber and contour of the thoracic aorta. Main pulmonary artery measures 3.9 cm. No significant enlargement of the pulmonary arteries as they extend toward the outer 1/3 of the lung. Mediastinum/Nodes: Small lymph nodes of the mediastinum. No hilar or subcarinal lymph nodes. Unremarkable course of the thoracic esophagus. Hemodialysis catheter from IJ approach terminates in the upper right atrium. There is a second hemodialysis catheter terminating in the right atrium from a femoral approach. This was not present on the prior plain film. Lungs/Pleura: Rounded soft tissue at the posterior aspect of the right lower lobe, 4.2 cm on axial  images, 5.8 cm on parasagittal images, with similar configuration to the comparison abdominal CT. There is associated volume loss/atelectasis with some focal calcifications within  the adjacent lung parenchyma. Trace right-sided pleural fluid/thickening, unchanged from the comparison. No pneumothorax. No confluent airspace disease of the right upper lobe or the left lung. No endotracheal or endobronchial debris. Upper Abdomen: No acute finding of the upper abdomen. Liver steatosis Musculoskeletal: No acute displaced fracture. Sclerotic changes at the superior endplate of T6 with no acute fracture line identified. Partially healed rib fractures on the left of anterior fifth, seventh, eighth ribs partially healed right-sided rib fractures of 8 and 9. IMPRESSION: Rounded soft tissue mass of the right lower lobe which has not changed significantly in size compared to the prior abdominal CT. Differential again includes both malignancy and rounded atelectasis. Follow-up/surveillance imaging to consider would be both contrast-enhanced CT versus PET-CT. Coronary artery disease. Electronically Signed   By: Corrie Mckusick D.O.   On: 01/27/2019 15:41    EKG:   Orders placed or performed during the hospital encounter of 01/26/19  . ED EKG  . ED EKG    ASSESSMENT AND PLAN:    58 year old male patient with a known history of  end-stage renal disease on dialysis, diabetes mellitus type 2, GERD, hyperlipidemia, neuropathy, sleep apnea was referred from dialysis center by nephrologist Dr. Candiss Norse.  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 twice daily  -Nonfunctional dialysis catheter Patient had dialysis cath placement today in the right groin area by vascular surgery followed by hemodialysis today -Vascular surgery is recommending  graft placement possibly on Friday.  Appreciated Dr. Delana Meyer recommendations.   Cardiology consult placed to Dr. Nehemiah Massed for clearance-patient had cardiac stress test 01/28/19  -End-stage renal disease Nephrology consult for dialysis.  Seen by Dr. Candiss Norse.  Dialysis per nephrology  -Right pleural-based lung mass Seen by pulmonology Dr. Ashby Dawes CT chest without contrast ordered.  Based on the results patient might need a surveillance CT  -Noncompliance with  follow-up appointment As per Dr. Delana Meyer patient is no-show for several months.  Reinforced the importance of being compliant with his medications and follow-up appointments.  Patient verbalized understanding   -Hyponatremia Improving sodium at 134 Patient is getting hemodialysis  -Mild hyperkalemia-resolved following dialysis   -Type 2 diabetes mellitus Diabetic diet Sliding scale coverage with insulin   -DVT prophylaxis subcu heparin   All the records are reviewed and case discussed with Care Management/Social Workerr. Management plans discussed with the patient, he is  in agreement.  CODE STATUS: fc   TOTAL TIME TAKING CARE OF THIS PATIENT: 35  minutes.   POSSIBLE D/C IN 3-4 DAYS, DEPENDING ON CLINICAL CONDITION.  Note: This dictation was prepared with Dragon dictation along with smaller phrase technology. Any transcriptional errors that result from this process are unintentional.   Nicholes Mango M.D on 01/28/2019 at 12:45 PM  Between 7am to 6pm - Pager - [phone] After 6pm go to [email or web address] - password [email or web address] [facility]  Office  [phone]  CC: Primary care physician; [name], MD

## 2019-01-28 NOTE — Consult Note (Signed)
ANTICOAGULATION CONSULT NOTE  Pharmacy Consult for heparin drip management Indication: VTE prophylaxis  Patient Measurements: Height: 5\' 10"  (177.8 cm) Weight: 251 lb 15.8 oz (114.3 kg) IBW/kg (Calculated) : 73 Heparin Dosing Weight: 96.5 kg  Vital Signs: Temp: 98.4 F (36.9 C) (04/22 0357) Temp Source: Oral (04/22 0357) BP: 113/70 (04/22 0357) Pulse Rate: 95 (04/22 0357)  Labs: Recent Labs    01/26/19 1052 01/27/19 0310 01/27/19 1448 01/28/19 0011 01/28/19 0601  HGB 11.9* 9.9*  --   --  8.9*  HCT 35.5* 29.8*  --   --  27.7*  PLT 175 139*  --   --  126*  APTT  --   --  27  --   --   LABPROT 12.5  --   --   --   --   INR 0.9  --   --   --   --   HEPARINUNFRC  --   --   --  0.52  --   CREATININE 8.43* 8.69*  --   --   --     Estimated Creatinine Clearance: 11.9 mL/min (A) (by C-G formula based on SCr of 8.69 mg/dL (H)).   Medical History: Past Medical History:  Diagnosis Date  . Anemia   . Cellulitis and abscess of right lower extremity 03/01/2017  . Chronic kidney disease    dialysis m,w,f  . Diabetes mellitus without complication ([place])   . Edema extremities 03/01/2017   bilateral swelling  . GERD (gastroesophageal reflux disease)   . High cholesterol   . High triglycerides   . Hypertension   . Neuropathy   . Sleep apnea    can't use cpap d/t feelings of suffocation    Medications:  Scheduled:  . amitriptyline  25 mg Oral QHS  . calcium acetate  1,334 mg Oral TID WC  . Chlorhexidine Gluconate Cloth  6 each Topical Q0600  . gabapentin  300 mg Oral Daily  . glipiZIDE  5 mg Oral QAC breakfast  . insulin aspart  0-5 Units Subcutaneous QHS  . insulin aspart  0-9 Units Subcutaneous TID WC  . insulin aspart  12 Units Subcutaneous TID WC  . insulin detemir  20 Units Subcutaneous Daily  . metoprolol tartrate  12.5 mg Oral BID  . midodrine  10 mg Oral BID WC  . mirtazapine  15 mg Oral QHS  . mometasone-formoterol  2 puff Inhalation BH-q7a  . pantoprazole   40 mg Oral Daily  . sodium bicarbonate  650 mg Oral BID  . sodium chloride flush  3 mL Intravenous Q12H    Assessment: 58 y.o. male with a h/o ESRD on HD and was referred from dialysis center by nephrologist Dr. Candiss Norse.  Patient dialysis catheter was recently changed at the center but was nonfunctional. He has a temp cath in place and received HD today but needs a HERO graft which will be scheduled for Friday following cardiology clearance. He is on no chronic anticoagulation PTA. Hgb, PLT trending down: will continue to monitor, baseline INR 0.9, H&H, PLT trending down: will continue to monitor  Goal of Therapy:  Heparin level 0.3-0.7 units/ml Monitor platelets by anticoagulation protocol: Yes   Heparin Course 4/21 initiation: 1650 units/hr 4/22 0011 HL 0.52 4/22 0601 HL 0.49  Plan:  --this is the second consecutive therapeutic heparin level --continue current rate of 1650 units/hr --recheck next heparin level with tomorrow am labs --CBC in am  Vallery Sa, PharmD Clinical Pharmacist 01/28/2019

## 2019-01-29 LAB — CBC
HCT: 28.5 % — ABNORMAL LOW (ref 39.0–52.0)
Hemoglobin: 8.9 g/dL — ABNORMAL LOW (ref 13.0–17.0)
MCH: 29.7 pg (ref 26.0–34.0)
MCHC: 31.2 g/dL (ref 30.0–36.0)
MCV: 95 fL (ref 80.0–100.0)
Platelets: 122 10*3/uL — ABNORMAL LOW (ref 150–400)
RBC: 3 MIL/uL — ABNORMAL LOW (ref 4.22–5.81)
RDW: 15.7 % — ABNORMAL HIGH (ref 11.5–15.5)
WBC: 3.2 10*3/uL — ABNORMAL LOW (ref 4.0–10.5)
nRBC: 0 % (ref 0.0–0.2)

## 2019-01-29 LAB — BASIC METABOLIC PANEL
Anion gap: 10 (ref 5–15)
BUN: 49 mg/dL — ABNORMAL HIGH (ref 6–20)
CO2: 25 mmol/L (ref 22–32)
Calcium: 8.4 mg/dL — ABNORMAL LOW (ref 8.9–10.3)
Chloride: 100 mmol/L (ref 98–111)
Creatinine, Ser: 6.54 mg/dL — ABNORMAL HIGH (ref 0.61–1.24)
GFR calc Af Amer: 10 mL/min — ABNORMAL LOW (ref >60)
GFR calc non Af Amer: 9 mL/min — ABNORMAL LOW (ref >60)
Glucose, Bld: 136 mg/dL — ABNORMAL HIGH (ref 70–99)
Potassium: 4.5 mmol/L (ref 3.5–5.1)
Sodium: 135 mmol/L (ref 135–145)

## 2019-01-29 LAB — HEPARIN LEVEL (UNFRACTIONATED): Heparin Unfractionated: 0.47 IU/mL (ref 0.30–0.70)

## 2019-01-29 LAB — GLUCOSE, CAPILLARY
Glucose-Capillary: 78 mg/dL (ref 70–99)
Glucose-Capillary: 135 mg/dL — ABNORMAL HIGH (ref 70–99)
Glucose-Capillary: 136 mg/dL — ABNORMAL HIGH (ref 70–99)
Glucose-Capillary: 136 mg/dL — ABNORMAL HIGH (ref 70–99)

## 2019-01-29 MED ORDER — CEFAZOLIN SODIUM-DEXTROSE 1-4 GM/50ML-% IV SOLN: 1.0000 g | INTRAVENOUS | Status: DC

## 2019-01-29 MED ORDER — CEFAZOLIN SODIUM-DEXTROSE 2-4 GM/100ML-% IV SOLN
2.0000 g | INTRAVENOUS | Status: AC
  Administered 2019-01-30: 2 g via INTRAVENOUS
  Filled 2019-01-29: qty 100
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
## 2019-01-29 NOTE — Progress Notes (Signed)
Post HD Assessment    01/29/19 1733  Neurological  Level of Consciousness Alert  Orientation Level Oriented X4  Respiratory  Respiratory Pattern Regular;Unlabored  Chest Assessment Chest expansion symmetrical  Bilateral Breath Sounds Clear;Diminished  Cough None  Cardiac  Pulse Irregular  Heart Sounds S1, S2  ECG Monitor Yes  Cardiac Rhythm SB  Vascular  R Radial Pulse +2  L Radial Pulse +2  Edema Generalized  Generalized Edema +1  Psychosocial  Psychosocial (WDL) WDL

## 2019-01-29 NOTE — Progress Notes (Signed)
HD Tx completed, tolerated well, UF goal met.    01/29/19 1715  Vital Signs  Pulse Rate 88  Pulse Rate Source Monitor  Resp 14  BP 104/63  BP Location Left Arm  BP Method Automatic  Patient Position (if appropriate) Lying  Oxygen Therapy  O2 Device Room Air  During Hemodialysis Assessment  HD Safety Checks Performed Yes  KECN 56.8 [facility]  Dialysis Fluid Bolus Normal Saline  Bolus Amount (mL) 250 mL  Intra-Hemodialysis Comments Tx completed;Tolerated well

## 2019-01-29 NOTE — Progress Notes (Signed)
Post HD [facility]    01/29/19 1721  Hand-Off documentation  Report given to (Full Name) Delbert Harness, RN   Report received from (Full Name) Beatris Ship, RN   Vital Signs  Temp 98.1 F (36.7 C)  Temp Source Oral  Pulse Rate 84  Pulse Rate Source Monitor  Resp 10  BP 113/68  BP Location Left Arm  BP Method Automatic  Patient Position (if appropriate) Lying  Oxygen Therapy  SpO2 100 %  O2 Device Room Air  Pain Assessment  Pain Scale 0-10  Pain Score 0  Post-Hemodialysis Assessment  Rinseback Volume (mL) 250 mL  KECN 56.8 V  Dialyzer Clearance Lightly streaked  Duration of HD Treatment -hour(s) 3.5 hour(s)  Hemodialysis Intake (mL) 500 mL  UF Total -Machine (mL) 2000 mL  Net UF (mL) 1500 mL  Tolerated HD Treatment Yes  Hemodialysis Catheter Right Femoral vein Double-lumen  Placement Date/Time: 01/27/19 1800   Orientation: Right  Access Location: (c) Femoral vein  Hemodialysis Catheter Type: Double-lumen  Site Condition No complications  Blue Lumen Status Heparin locked  Red Lumen Status Heparin locked  Catheter fill solution Heparin 1000 units/ml  Catheter fill volume (Arterial) 3 cc  Catheter fill volume (Venous) 3  Post treatment catheter status Capped and Clamped

## 2019-01-29 NOTE — Consult Note (Signed)
ANTICOAGULATION CONSULT NOTE  Pharmacy Consult for heparin drip management Indication: VTE prophylaxis  Patient Measurements: Height: 5\' 10"  (177.8 cm) Weight: 251 lb 15.8 oz (114.3 kg) IBW/kg (Calculated) : 73 Heparin Dosing Weight: 96.5 kg  Vital Signs: Temp: 98.2 F (36.8 C) (04/23 0512) Temp Source: Oral (04/23 0512) BP: 154/102 (04/23 0512) Pulse Rate: 78 (04/23 0512)  Labs: Recent Labs    01/26/19 1052 01/27/19 0310 01/27/19 1448 01/28/19 0011 01/28/19 0601 01/29/19 0545  HGB 11.9* 9.9*  --   --  8.9* 8.9*  HCT 35.5* 29.8*  --   --  27.7* 28.5*  PLT 175 139*  --   --  126* 122*  APTT  --   --  27  --   --   --   LABPROT 12.5  --   --   --   --   --   INR 0.9  --   --   --   --   --   HEPARINUNFRC  --   --   --  0.52 0.49 0.47  CREATININE 8.43* 8.69*  --   --  6.50*  --     Estimated Creatinine Clearance: 15.9 mL/min (A) (by C-G formula based on SCr of 6.5 mg/dL (H)).   Medical History: Past Medical History:  Diagnosis Date  . Anemia   . Cellulitis and abscess of right lower extremity 03/01/2017  . Chronic kidney disease    dialysis m,w,f  . Diabetes mellitus without complication ([place])   . Edema extremities 03/01/2017   bilateral swelling  . GERD (gastroesophageal reflux disease)   . High cholesterol   . High triglycerides   . Hypertension   . Neuropathy   . Sleep apnea    can't use cpap d/t feelings of suffocation    Medications:  Scheduled:  . amitriptyline  25 mg Oral QHS  . calcium acetate  1,334 mg Oral TID WC  . Chlorhexidine Gluconate Cloth  6 each Topical Q0600  . gabapentin  300 mg Oral Daily  . insulin aspart  0-5 Units Subcutaneous QHS  . insulin aspart  0-9 Units Subcutaneous TID WC  . insulin aspart  6 Units Subcutaneous TID WC  . insulin detemir  18 Units Subcutaneous Daily  . metoprolol tartrate  12.5 mg Oral BID  . midodrine  10 mg Oral BID WC  . mirtazapine  15 mg Oral QHS  . mometasone-formoterol  2 puff Inhalation BH-q7a   . pantoprazole  40 mg Oral Daily  . sodium bicarbonate  650 mg Oral BID  . sodium chloride flush  3 mL Intravenous Q12H    Assessment: 58 y.o. male with a h/o ESRD on HD and was referred from dialysis center by nephrologist Dr. Candiss Norse.  Patient dialysis catheter was recently changed at the center but was nonfunctional. He has a temp cath in place and received HD today but needs a HERO graft which will be scheduled for Friday following cardiology clearance. He is on no chronic anticoagulation PTA. Hgb, PLT trending down: will continue to monitor, baseline INR 0.9, H&H, PLT trending down: will continue to monitor  Goal of Therapy:  Heparin level 0.3-0.7 units/ml Monitor platelets by anticoagulation protocol: Yes   Heparin Course 4/21 initiation: 1650 units/hr 4/22 0011 HL 0.52 4/22 0601 HL 0.49  Plan:  04/23 @ 0500 HL 0.47 therapeutic. Will continue current rate and will recheck w/ am labs. H/h low stable will continue to monitor.  Tobie Lords, PharmD, BCPS Clinical Pharmacist  01/29/2019  

## 2019-01-29 NOTE — Progress Notes (Signed)
[facility] Daily Progress Note   Will plan on right HeRO Graft Insertion with Dr. Delana Meyer tomorrow (01/30/19). Will pre-op.  Discussed with Dr. Eber Hong Stegmayer PA-C 01/29/2019 9:12 AM

## 2019-01-29 NOTE — Care Management Important Message (Signed)
Important Message  Patient Details  Name: Steven [name] MRN: [id number] Date of Birth: 17-Dec-1960   Medicare Important Message Given:  Yes    Dannette Barbara 01/29/2019, 1:13 PM

## 2019-01-29 NOTE — Progress Notes (Signed)
Pre HD Tx    01/29/19 1342  Vital Signs  Temp 98.4 F (36.9 C)  Temp Source Oral  Pulse Rate 88  Pulse Rate Source Monitor  Resp 16  BP 124/72  BP Location Left Arm  BP Method Automatic  Patient Position (if appropriate) Lying  Oxygen Therapy  SpO2 96 %  O2 Device Room Air  Pulse Oximetry Type Continuous  Pain Assessment  Pain Scale 0-10  Pain Score 0  Time-Out for Hemodialysis  What Procedure? HD  Pt Identifiers(min of two) First/Last Name;MRN/Account#  Correct Site? Yes  Correct Side? Yes  Correct Procedure? Yes  Consents Verified? Yes  Rad Studies Available? N/A  Safety Precautions Reviewed? Yes  Engineer, civil (consulting) Number 3  Station Number 2  UF/Alarm Test Passed  Conductivity: Meter 13.8  Conductivity: Machine  13.9  pH 7.4  Reverse Osmosis Main  Normal Saline Lot Number [id number]  Dialyzer Lot Number [id number]  Disposable Set Lot Number [id number]  Machine Temperature 98.6 F (37 C)  Musician and Audible Yes  Blood Lines Intact and Secured Yes  Pre Treatment Patient Checks  Vascular access used during treatment Catheter  Hepatitis B Surface Antigen Results Negative  Date Hepatitis B Surface Antigen Drawn 12/25/18  Hepatitis B Surface Antibody 3  Date Hepatitis B Surface Antibody Drawn 07/22/18  Hemodialysis Consent Verified Yes  Hemodialysis Standing Orders Initiated Yes  ECG (Telemetry) Monitor On Yes  Prime Ordered Normal Saline  Length of  DialysisTreatment -hour(s) 3.5 Hour(s)  Dialysis Treatment Comments Na 140  Dialyzer Elisio 17H NR  Dialysate 2K, 2.5 Ca  Dialysis Anticoagulant None  Dialysate Flow Ordered 800  Blood Flow Rate Ordered 400 mL/min (Pt has newly placed catheter , max BFR 250)  Ultrafiltration Goal 1.5 Liters  Dialysis Blood Pressure Support Ordered Normal Saline  Education / Care Plan  Dialysis Education Provided Yes  Documented Education in Care Plan Yes  Hemodialysis Catheter Right Femoral vein Double-lumen   Placement Date/Time: 01/27/19 1800   Orientation: Right  Access Location: (c) Femoral vein  Hemodialysis Catheter Type: Double-lumen  Site Condition No complications  Blue Lumen Status Blood return noted  Red Lumen Status Blood return noted  Dressing Type Gauze/Drain sponge  Dressing Status Old drainage  Interventions New dressing

## 2019-01-29 NOTE — Progress Notes (Signed)
Pre HD Assessment    01/29/19 1340  Neurological  Level of Consciousness Alert  Orientation Level Oriented X4  Respiratory  Respiratory Pattern Regular;Unlabored  Chest Assessment Chest expansion symmetrical  Bilateral Breath Sounds Diminished  Cough None  Cardiac  Pulse Irregular  Heart Sounds S1, S2  ECG Monitor Yes  Cardiac Rhythm SB  Vascular  R Radial Pulse +2  L Radial Pulse +2  Edema Generalized  Generalized Edema +1  Psychosocial  Psychosocial (WDL) WDL

## 2019-01-29 NOTE — Progress Notes (Signed)
[facility], [place] 01/29/19  Subjective:   Patient known to our practice from outpatient dialysis Presents because dialysis cathter would not function in outpatient setting despite tPA infusion on Friday Underwent new permcath placement on Monday which also did not work because of clot in SVC tPA thrombolysis of SVC clot was attempted without success Therefore, a right femoral dialysis cathter was placed Patient underwent dialysis on Tuesday.  Doing well today.  No acute complaints.  Cardiology, pulmonary and vascular evaluations ongoing.   Objective:  Vital signs in last 24 hours:  Temp:  [97.7 F (36.5 C)-98.2 F (36.8 C)] 98.2 F (36.8 C) (04/23 0512) Pulse Rate:  [78-135] 135 (04/23 0915) Resp:  [16-20] 20 (04/23 0512) BP: (104-154)/(85-102) 154/102 (04/23 0512) SpO2:  [97 %-99 %] 99 % (04/23 0512)  Weight change:  Filed Weights   01/26/19 1415 01/27/19 1019 01/27/19 1400  Weight: 108.9 kg 115.7 kg 114.3 kg    Intake/Output:    Intake/Output Summary (Last 24 hours) at 01/29/2019 1157 Last data filed at 01/29/2019 0512 Gross per 24 hour  Intake 858.69 ml  Output -  Net 858.69 ml     Physical Exam: General: Well appearing  HEENT anicteric  Neck Supple  Pulm/lungs Clear, normal effort on room air  CVS/Heart No rub  Abdomen:  Soft, NT, non distended  Extremities: + edema b/l  Neurologic: Alert, oriented  Skin: No acute rashes  Access: Rt IJ PC, Rt femoral permcath       Basic Metabolic Panel:  Recent Labs  Lab 01/23/19 1052 01/26/19 1052 01/27/19 0310 01/28/19 0601 01/29/19 0545  NA 121* 122* 127* 134* 135  K 5.9* 5.4* 5.0 4.2 4.5  CL 87* 87* 94* 99 100  CO2 16* 14* 17* 25 25  GLUCOSE 227* 176* 134* 114* 136*  BUN 76* 91* 95* 48* 49*  CREATININE 7.15* 8.43* 8.69* 6.50* 6.54*  CALCIUM 8.5* 8.9 8.4* 8.2* 8.4*     CBC: Recent Labs  Lab 01/23/19 1052 01/26/19 1052 01/27/19 0310 01/28/19 0601 01/29/19 0545   WBC 3.6* 3.1* 3.9* 2.8* 3.2*  NEUTROABS 2.1 1.8  --   --   --   HGB 10.8* 11.9* 9.9* 8.9* 8.9*  HCT 32.2* 35.5* 29.8* 27.7* 28.5*  MCV 90.4 90.6 91.7 94.2 95.0  PLT 135* 175 139* 126* 122*      Lab Results  Component Value Date   HEPBSAG Negative 10/17/2018   HEPBSAB Non Reactive 02/28/2017   HEPBIGM Negative 02/28/2017      Microbiology:  Recent Results (from the past 240 hour(s))  MRSA PCR Screening     Status: None   Collection Time: 01/26/19 10:35 PM  Result Value Ref Range Status   MRSA by PCR NEGATIVE NEGATIVE Final    Comment:        The GeneXpert MRSA Assay (FDA approved for NASAL specimens only), is one component of a comprehensive MRSA colonization surveillance program. It is not intended to diagnose MRSA infection nor to guide or monitor treatment for MRSA infections. Performed at [facility], [address] [place], [place] [address]     Coagulation Studies: No results for input(s): LABPROT, INR in the last 72 hours.  Urinalysis: No results for input(s): COLORURINE, LABSPEC, PHURINE, GLUCOSEU, HGBUR, BILIRUBINUR, KETONESUR, PROTEINUR, UROBILINOGEN, NITRITE, LEUKOCYTESUR in the last 72 hours.  Invalid input(s): APPERANCEUR    Imaging: Ct Chest Wo Contrast  Result Date: 01/27/2019 CLINICAL DATA:  58 year old male with pleural based lung mass EXAM:  CT CHEST WITHOUT CONTRAST TECHNIQUE: Multidetector CT imaging of the chest was performed following the standard protocol without IV contrast. COMPARISON:  Abdominal CT 10/17/2018 FINDINGS: Cardiovascular: Heart size within normal limits. No pericardial fluid/thickening. Calcifications of the left main, left anterior descending, circumflex, right coronary arteries. Unremarkable course caliber and contour of the thoracic aorta. Main pulmonary artery measures 3.9 cm. No significant enlargement of the pulmonary arteries as they extend toward the outer 1/3 of the lung. Mediastinum/Nodes: Small lymph  nodes of the mediastinum. No hilar or subcarinal lymph nodes. Unremarkable course of the thoracic esophagus. Hemodialysis catheter from IJ approach terminates in the upper right atrium. There is a second hemodialysis catheter terminating in the right atrium from a femoral approach. This was not present on the prior plain film. Lungs/Pleura: Rounded soft tissue at the posterior aspect of the right lower lobe, 4.2 cm on axial images, 5.8 cm on parasagittal images, with similar configuration to the comparison abdominal CT. There is associated volume loss/atelectasis with some focal calcifications within the adjacent lung parenchyma. Trace right-sided pleural fluid/thickening, unchanged from the comparison. No pneumothorax. No confluent airspace disease of the right upper lobe or the left lung. No endotracheal or endobronchial debris. Upper Abdomen: No acute finding of the upper abdomen. Liver steatosis Musculoskeletal: No acute displaced fracture. Sclerotic changes at the superior endplate of T6 with no acute fracture line identified. Partially healed rib fractures on the left of anterior fifth, seventh, eighth ribs partially healed right-sided rib fractures of 8 and 9. IMPRESSION: Rounded soft tissue mass of the right lower lobe which has not changed significantly in size compared to the prior abdominal CT. Differential again includes both malignancy and rounded atelectasis. Follow-up/surveillance imaging to consider would be both contrast-enhanced CT versus PET-CT. Coronary artery disease. Electronically Signed   By: Corrie Mckusick D.O.   On: 01/27/2019 15:41   Nm Myocar Multi W/spect W/wall Motion / Ef  Result Date: 01/28/2019  The study is normal.  This is a low risk study.  The left ventricular ejection fraction is normal (55-65%).  Blood pressure demonstrated a normal response to exercise.  There was no ST segment deviation noted during stress.      Medications:   . amitriptyline  25 mg Oral QHS  .  calcium acetate  1,334 mg Oral TID WC  . Chlorhexidine Gluconate Cloth  6 each Topical Q0600  . gabapentin  300 mg Oral Daily  . insulin aspart  0-5 Units Subcutaneous QHS  . insulin aspart  0-9 Units Subcutaneous TID WC  . insulin aspart  6 Units Subcutaneous TID WC  . insulin detemir  18 Units Subcutaneous Daily  . metoprolol tartrate  12.5 mg Oral BID  . midodrine  10 mg Oral BID WC  . mirtazapine  15 mg Oral QHS  . mometasone-formoterol  2 puff Inhalation BH-q7a  . pantoprazole  40 mg Oral Daily  . sodium bicarbonate  650 mg Oral BID  . sodium chloride flush  3 mL Intravenous Q12H     Assessment/ Plan:  58 y.o. Caucasian male with ESRD hypertension, diabetes mellitus type 2 insulin dependent, hyperlipidemia, GERD, and morbid obesity  [facility]/MWF/112kg.  1. Complication of dialysis access 2. Hyperkalemia 3. LE edema 4. ESRD, dialysis dependent 5. SVC  Thrombosis 6. 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  Anemia of chronic kidney disease 8.  Secondary hyperparathyroidism   Plan: Dialysis today via rt fem permcath Plan for cardiac clearance while  inpatient and then rt arm HERO graft placement on Friday Patient is also interested in learning about PD-we will arrange for this as outpatient Calcium acetate for phos binding EPO with HD Heparin and subsequently Eliquis for anticoagulation Repeat CT chest shows no change in the mass.  Appreciate oncology evaluation.  PET scan ordered Liberal diet since patient has not been eating well at home Phoslo with meals for phosphorus binding    LOS: [place] 4/23/202011:57 AM  [place], [place]  Note: This note was prepared with Dragon dictation. Any transcription errors are unintentional

## 2019-01-29 NOTE — Progress Notes (Signed)
HD Tx started w/o complication. Unable to run catheter above 250BFR, MD aware   01/29/19 1345  Vital Signs  Pulse Rate 86  Pulse Rate Source Monitor  Resp 16  BP 132/76  BP Location Left Arm  BP Method Automatic  Patient Position (if appropriate) Lying  Oxygen Therapy  SpO2 97 %  O2 Device Room Air  Pulse Oximetry Type Continuous  During Hemodialysis Assessment  Blood Flow Rate (mL/min) 250 mL/min  Arterial Pressure (mmHg) -160 mmHg  Venous Pressure (mmHg) 110 mmHg  Transmembrane Pressure (mmHg) 60 mmHg  Ultrafiltration Rate (mL/min) 660 mL/min  Dialysate Flow Rate (mL/min) 800 ml/min  Conductivity: Machine  14  HD Safety Checks Performed Yes  Dialysis Fluid Bolus Normal Saline  Bolus Amount (mL) 250 mL  Intra-Hemodialysis Comments Tx initiated

## 2019-01-29 NOTE — Evaluation (Signed)
Physical Therapy Evaluation Patient Details Name: Steven [name] MRN: [id number] DOB: 26-Nov-1960 Today's Date: 01/29/2019   History of Present Illness  Steven [name] is a 58yo male who comes to [facility] on 4/20 after missing 1w HD 2/2 his port not functioning. Pt underwent placement of Rt tunneled HD cath at the Rt IJ on 4/20 which subsequently did not function properly, then on 4/21 a Rt femoral tunneled permacath was placed. PMH: LEE, ESRD on HD, OSA, HTN, PND. PTA pt reports sustaining multiple falls with worswening balance issues, but has started using a RW about 6MA which has reduced his falls significantly. Pt reports he is concerned about his balance deficits.   Clinical Impression  Pt admitted with above diagnosis. Pt currently with functional limitations due to the deficits listed below (see "PT Problem List"). Upon entry, pt in bed, awake and agreeable to participate. The pt is alert and oriented x3, pleasant, fairly verbose and mildly tangential, and following simple commands consistently. Pt performing bed mobility and transfers without physical assistance. Pt requires holding onto either fixed objects in room or IV pole for stability, which is consistent with his PLOF. Pt AMB around the unit, his wide based, steppage gait demonstrative of bilat PND. MMT reveals foot drop more progressed on the Left than Right. Pt is SOB after 271ft AMB and HR peaks at 136 bpm. RN reports not having given the patient his metoprolol yet. Functional mobility assessment demonstrates increased effort/time requirements, poor tolerance, and need for physical assistance, whereas the patient performed these at a higher level of independence PTA. Pt will benefit from skilled PT intervention to increase independence and safety with basic mobility in preparation for discharge to the venue listed below.       Follow Up Recommendations Outpatient PT;Home health PT(is appropriate for OPPT, but COVID has forced clinic  closures; RNCM reports HHPT waiving 'homebound criteria')    Equipment Recommendations  None recommended by PT    Recommendations for Other Services       Precautions / Restrictions Precautions Precautions: Fall Precaution Comments: New Rt femoral permacath. Restrictions Weight Bearing Restrictions: No      Mobility  Bed Mobility Overal bed mobility: Independent                Transfers Overall transfer level: Modified independent               General transfer comment: holding onto IV pole and/or wall for balance  Ambulation/Gait Ambulation/Gait assistance: Supervision Gait Distance (Feet): 200 Feet Assistive device: IV Pole(pt declines AMB without IV pole, concerns about balance and falling) Gait Pattern/deviations: Steppage Gait velocity: not claculated: HR to 136bpm during AMB, c SOB, post AMB speech interupted by elevated RR.    General Gait Details: wide based gait with steppage pattern, qualified by MMT weakness of bilat anterior compartments as well as bilat pedal numbness/pedal edema. Also of note hemosiderin staining bilat.   Stairs            Wheelchair Mobility    Modified Rankin (Stroke Patients Only)       Balance Overall balance assessment: Modified Independent;Mild deficits observed, not formally tested;History of Falls                                           Pertinent Vitals/Pain Pain Assessment: No/denies pain    Home Living Family/patient expects to be  discharged to:: Private residence Living Arrangements: Alone Available Help at Discharge: Family Type of Home: Mobile home Home Access: Ramped entrance     [place]: One [place]: Environmental consultant - 2 wheels;Walker - 4 wheels;Kasandra Knudsen - single point Additional Comments: Reports no falls in 3 months, but reports frequent falls in the past 2 years, use RW more now, Endorses some balance problems at baseline.     Prior Function Level of Independence:  Independent         Comments: Pt has been on HD for 2 years; uses RW when out of the house. Used to sell insurance and sell cars. Still able to access the community for IADL.      Hand Dominance   Dominant Hand: Right    Extremity/Trunk Assessment   Upper Extremity Assessment Upper Extremity Assessment: Overall WFL for tasks assessed    Lower Extremity Assessment Lower Extremity Assessment: Overall WFL for tasks assessed;Generalized weakness;RLE deficits/detail;LLE deficits/detail RLE Deficits / Details: mild numbness, ankle DFlexion: 4/5, pitting edema 1/4 doesn't speak to visually obvious LEE LLE Deficits / Details: moderate numbness, ankle DFlexion: 4-/5, pitting edema 1/4 doesn't speak to visually obvious LEE       Communication   Communication: No difficulties  Cognition Arousal/Alertness: Awake/alert Behavior During Therapy: WFL for tasks assessed/performed;Agitated(easily agitated when author begins moving things in room to prepare for mobility. ) Overall Cognitive Status: Within Functional Limits for tasks assessed                                 General Comments: Later apologizes for 'be rude,' author perceived no rudeness pe se      General Comments      Exercises     Assessment/Plan    PT Assessment Patient needs continued PT services  PT Problem List Decreased strength;Decreased range of motion;Decreased activity tolerance;Decreased balance;Decreased mobility       PT Treatment Interventions DME instruction;Therapeutic exercise;Gait training;Stair training;Functional mobility training;Therapeutic activities;Balance training;Neuromuscular re-education;Patient/family education    PT Goals (Current goals can be found in the Care Plan section)  Acute Rehab PT Goals Patient Stated Goal: improve strength and improve balance  PT Goal Formulation: With patient Time For Goal Achievement: 02/12/19 Potential to Achieve Goals: Fair    Frequency  Min 2X/week   Barriers to discharge        Co-evaluation               AM-PAC PT "6 Clicks" Mobility  Outcome Measure Help needed turning from your back to your side while in a flat bed without using bedrails?: None Help needed moving from lying on your back to sitting on the side of a flat bed without using bedrails?: None Help needed moving to and from a bed to a chair (including a wheelchair)?: None Help needed standing up from a chair using your arms (e.g., wheelchair or bedside chair)?: None Help needed to walk in hospital room?: A Little Help needed climbing 3-5 steps with a railing? : A Little 6 Click Score: 22    End of Session   Activity Tolerance: Patient tolerated treatment well;Patient limited by fatigue;Treatment limited secondary to medical complications (Comment)(HR in 130s) Patient left: in bed;with call bell/phone within reach;with nursing/sitter in room(pt declines OOB to chair) Nurse Communication: Mobility status PT Visit Diagnosis: Unsteadiness on feet (R26.81);Other abnormalities of gait and mobility (R26.89);Difficulty in walking, not elsewhere classified (R26.2)    Time: 9417-4081  PT Time Calculation (min) (ACUTE ONLY): 26 min   Charges:   PT Evaluation $PT Eval Moderate Complexity: 1 Mod          9:51 AM, 01/29/19 Etta Grandchild, PT, DPT Physical Therapist - [facility]  [phone] ([place])    , C 01/29/2019, 9:43 AM

## 2019-01-30 ENCOUNTER — Inpatient Hospital Stay: Payer: Medicare Other

## 2019-01-30 ENCOUNTER — Encounter
Admission: EM | Disposition: A | Discharge status: Home / Self Care | Payer: Self-pay | Source: Home / Self Care | Attending: Internal Medicine

## 2019-01-30 ENCOUNTER — Inpatient Hospital Stay: Payer: Medicare Other | Admitting: Anesthesiology

## 2019-01-30 ENCOUNTER — Other Ambulatory Visit (INDEPENDENT_AMBULATORY_CARE_PROVIDER_SITE_OTHER): Payer: Self-pay | Admitting: Vascular Surgery

## 2019-01-30 ENCOUNTER — Encounter: Payer: Self-pay | Admitting: Anesthesiology

## 2019-01-30 DIAGNOSIS — Z992 Dependence on renal dialysis: Secondary | ICD-10-CM

## 2019-01-30 DIAGNOSIS — I871 Compression of vein: Secondary | ICD-10-CM

## 2019-01-30 DIAGNOSIS — N186 End stage renal disease: Secondary | ICD-10-CM

## 2019-01-30 HISTORY — PX: VASCULAR ACCESS DEVICE INSERTION: SHX5158

## 2019-01-30 LAB — TYPE AND SCREEN
ABO/RH(D): O POS
Antibody Screen: NEGATIVE

## 2019-01-30 LAB — GLUCOSE, CAPILLARY
Glucose-Capillary: 135 mg/dL — ABNORMAL HIGH (ref 70–99)
Glucose-Capillary: 98 mg/dL (ref 70–99)
Glucose-Capillary: 173 mg/dL — ABNORMAL HIGH (ref 70–99)
Glucose-Capillary: 193 mg/dL — ABNORMAL HIGH (ref 70–99)
Glucose-Capillary: 339 mg/dL — ABNORMAL HIGH (ref 70–99)

## 2019-01-30 LAB — BASIC METABOLIC PANEL
Anion gap: 12 (ref 5–15)
BUN: 29 mg/dL — ABNORMAL HIGH (ref 6–20)
CO2: 24 mmol/L (ref 22–32)
Calcium: 7.9 mg/dL — ABNORMAL LOW (ref 8.9–10.3)
Chloride: 99 mmol/L (ref 98–111)
Creatinine, Ser: 4.88 mg/dL — ABNORMAL HIGH (ref 0.61–1.24)
GFR calc Af Amer: 14 mL/min — ABNORMAL LOW (ref >60)
GFR calc non Af Amer: 12 mL/min — ABNORMAL LOW (ref >60)
Glucose, Bld: 167 mg/dL — ABNORMAL HIGH (ref 70–99)
Potassium: 4 mmol/L (ref 3.5–5.1)
Sodium: 135 mmol/L (ref 135–145)

## 2019-01-30 LAB — CBC
HCT: 27.2 % — ABNORMAL LOW (ref 39.0–52.0)
Hemoglobin: 8.4 g/dL — ABNORMAL LOW (ref 13.0–17.0)
MCH: 29.3 pg (ref 26.0–34.0)
MCHC: 30.9 g/dL (ref 30.0–36.0)
MCV: 94.8 fL (ref 80.0–100.0)
Platelets: 125 10*3/uL — ABNORMAL LOW (ref 150–400)
RBC: 2.87 MIL/uL — ABNORMAL LOW (ref 4.22–5.81)
RDW: 15.7 % — ABNORMAL HIGH (ref 11.5–15.5)
WBC: 3.9 10*3/uL — ABNORMAL LOW (ref 4.0–10.5)
nRBC: 0 % (ref 0.0–0.2)

## 2019-01-30 LAB — APTT: aPTT: 122 seconds — ABNORMAL HIGH (ref 24–36)

## 2019-01-30 LAB — MAGNESIUM: Magnesium: 1.4 mg/dL — ABNORMAL LOW (ref 1.7–2.4)

## 2019-01-30 LAB — PROTIME-INR
INR: 1 (ref 0.8–1.2)
Prothrombin Time: 12.8 seconds (ref 11.4–15.2)

## 2019-01-30 LAB — HEPARIN LEVEL (UNFRACTIONATED): Heparin Unfractionated: 0.67 IU/mL (ref 0.30–0.70)

## 2019-01-30 SURGERY — INSERTION, CATHETER, HERO
Anesthesia: General | Laterality: Right

## 2019-01-30 MED ORDER — OXYCODONE-ACETAMINOPHEN 5-325 MG PO TABS: 1.0000 | ORAL_TABLET | Freq: Four times a day (QID) | ORAL | 0 refills | Status: DC | PRN

## 2019-01-30 MED ORDER — BACITRACIN ZINC 500 UNIT/GM EX OINT
TOPICAL_OINTMENT | CUTANEOUS | Status: DC | PRN
  Administered 2019-01-30: 1 via TOPICAL

## 2019-01-30 MED ORDER — MAGNESIUM OXIDE 400 (241.3 MG) MG PO TABS
400.0000 mg | ORAL_TABLET | Freq: Once | ORAL | Status: DC
  Filled 2019-01-30 (×2): qty 1

## 2019-01-30 MED ORDER — TRAMADOL HCL 50 MG PO TABS: 50.0000 mg | ORAL_TABLET | Freq: Four times a day (QID) | ORAL | 0 refills | Status: DC | PRN

## 2019-01-30 MED ORDER — SUGAMMADEX SODIUM 200 MG/2ML IV SOLN
INTRAVENOUS | Status: DC | PRN
  Administered 2019-01-30: 200 mg via INTRAVENOUS

## 2019-01-30 MED ORDER — MAGNESIUM SULFATE 2 GM/50ML IV SOLN: 2.0000 g | Freq: Once | INTRAVENOUS | Status: DC

## 2019-01-30 MED ORDER — CEFAZOLIN SODIUM-DEXTROSE 2-4 GM/100ML-% IV SOLN
INTRAVENOUS | Status: AC
  Filled 2019-01-30: qty 100

## 2019-01-30 MED ORDER — SUCCINYLCHOLINE CHLORIDE 20 MG/ML IJ SOLN
INTRAMUSCULAR | Status: DC | PRN
  Administered 2019-01-30: 140 mg via INTRAVENOUS

## 2019-01-30 MED ORDER — ACETAMINOPHEN 325 MG PO TABS: 650.0000 mg | ORAL_TABLET | Freq: Four times a day (QID) | ORAL | Status: DC | PRN

## 2019-01-30 MED ORDER — FENTANYL CITRATE (PF) 100 MCG/2ML IJ SOLN: 25.0000 ug | INTRAMUSCULAR | Status: DC | PRN

## 2019-01-30 MED ORDER — FENTANYL CITRATE (PF) 250 MCG/5ML IJ SOLN
INTRAMUSCULAR | Status: AC
  Filled 2019-01-30: qty 5

## 2019-01-30 MED ORDER — PROMETHAZINE HCL 25 MG/ML IJ SOLN: 6.2500 mg | INTRAMUSCULAR | Status: DC | PRN

## 2019-01-30 MED ORDER — MIDAZOLAM HCL 2 MG/2ML IJ SOLN
INTRAMUSCULAR | Status: AC
  Filled 2019-01-30: qty 2

## 2019-01-30 MED ORDER — SENNOSIDES-DOCUSATE SODIUM 8.6-50 MG PO TABS: 1.0000 | ORAL_TABLET | Freq: Every evening | ORAL | Status: DC | PRN

## 2019-01-30 MED ORDER — SODIUM CHLORIDE 0.9 % IV SOLN
INTRAVENOUS | Status: DC | PRN
  Administered 2019-01-30: 13:00:00 25 ug/min via INTRAVENOUS

## 2019-01-30 MED ORDER — ASPIRIN EC 81 MG PO TBEC: 81.0000 mg | DELAYED_RELEASE_TABLET | Freq: Every day | ORAL | 2 refills | Status: DC

## 2019-01-30 MED ORDER — FENTANYL CITRATE (PF) 100 MCG/2ML IJ SOLN
INTRAMUSCULAR | Status: DC | PRN
  Administered 2019-01-30 (×2): 50 ug via INTRAVENOUS

## 2019-01-30 MED ORDER — PROPOFOL 10 MG/ML IV BOLUS
INTRAVENOUS | Status: DC | PRN
  Administered 2019-01-30: 120 mg via INTRAVENOUS

## 2019-01-30 MED ORDER — INSULIN ASPART 100 UNIT/ML ~~LOC~~ SOLN: 6.0000 [IU] | Freq: Three times a day (TID) | SUBCUTANEOUS | 11 refills | Status: DC

## 2019-01-30 MED ORDER — APIXABAN 2.5 MG PO TABS: 2.5000 mg | ORAL_TABLET | Freq: Two times a day (BID) | ORAL | 0 refills | Status: DC

## 2019-01-30 MED ORDER — MAGNESIUM 30 MG PO TABS: 30.0000 mg | ORAL_TABLET | Freq: Two times a day (BID) | ORAL | 0 refills | Status: DC

## 2019-01-30 MED ORDER — TRAMADOL HCL 50 MG PO TABS: 50.0000 mg | ORAL_TABLET | Freq: Four times a day (QID) | ORAL | Status: DC | PRN

## 2019-01-30 MED ORDER — ROCURONIUM BROMIDE 100 MG/10ML IV SOLN
INTRAVENOUS | Status: DC | PRN
  Administered 2019-01-30: 10 mg via INTRAVENOUS
  Administered 2019-01-30: 40 mg via INTRAVENOUS

## 2019-01-30 MED ORDER — DEXAMETHASONE SODIUM PHOSPHATE 10 MG/ML IJ SOLN
INTRAMUSCULAR | Status: DC | PRN
  Administered 2019-01-30: 10 mg via INTRAVENOUS

## 2019-01-30 MED ORDER — LIDOCAINE HCL (CARDIAC) PF 100 MG/5ML IV SOSY
PREFILLED_SYRINGE | INTRAVENOUS | Status: DC | PRN
  Administered 2019-01-30: 100 mg via INTRAVENOUS

## 2019-01-30 MED ORDER — BUPIVACAINE-EPINEPHRINE (PF) 0.25% -1:200000 IJ SOLN
INTRAMUSCULAR | Status: DC | PRN
  Administered 2019-01-30: 8 mL

## 2019-01-30 MED ORDER — MIDAZOLAM HCL 2 MG/2ML IJ SOLN
INTRAMUSCULAR | Status: DC | PRN
  Administered 2019-01-30: 2 mg via INTRAVENOUS

## 2019-01-30 MED ORDER — APIXABAN 2.5 MG PO TABS
2.5000 mg | ORAL_TABLET | Freq: Two times a day (BID) | ORAL | Status: DC
  Administered 2019-01-30 – 2019-01-31 (×2): 2.5 mg via ORAL
  Filled 2019-01-30 (×2): qty 1

## 2019-01-30 MED ORDER — INSULIN DETEMIR 100 UNIT/ML ~~LOC~~ SOLN: 18.0000 [IU] | Freq: Every day | SUBCUTANEOUS | 11 refills | Status: DC

## 2019-01-30 MED ORDER — LIDOCAINE HCL (CARDIAC) PF 100 MG/5ML IV SOSY: PREFILLED_SYRINGE | INTRAVENOUS | Status: DC | PRN

## 2019-01-30 MED ORDER — BACITRACIN ZINC 500 UNIT/GM EX OINT
TOPICAL_OINTMENT | CUTANEOUS | Status: AC
  Filled 2019-01-30: qty 28.35

## 2019-01-30 MED ORDER — APIXABAN 2.5 MG PO TABS: 2.5000 mg | ORAL_TABLET | Freq: Two times a day (BID) | ORAL | 6 refills | Status: DC

## 2019-01-30 SURGICAL SUPPLY — 84 items
ADH SKN CLS APL DERMABOND .7 (GAUZE/BANDAGES/DRESSINGS) ×4
APL PRP STRL LF DISP 70% ISPRP (MISCELLANEOUS) ×2
APPLIER CLIP 9.375 SM OPEN (CLIP)
APR CLP SM 9.3 20 MLT OPN (CLIP)
BAG DECANTER FOR FLEXI CONT (MISCELLANEOUS) ×2 IMPLANT
BALLN DORADO 8X60X80 (BALLOONS) ×2
BALLOON DORADO 8X60X80 (BALLOONS) IMPLANT
BLADE SURG 15 STRL LF DISP TIS (BLADE) ×1 IMPLANT
BLADE SURG 15 STRL SS (BLADE) ×2
BLADE SURG SZ11 CARB STEEL (BLADE) ×2 IMPLANT
BOOT SUTURE AID YELLOW STND (SUTURE) ×2 IMPLANT
BRUSH SCRUB EZ  4% CHG (MISCELLANEOUS) ×1
BRUSH SCRUB EZ 4% CHG (MISCELLANEOUS) ×1 IMPLANT
CANISTER SUCT 1200ML W/VALVE (MISCELLANEOUS) ×2 IMPLANT
CHLORAPREP W/TINT 26 (MISCELLANEOUS) ×4 IMPLANT
CLIP APPLIE 9.375 SM OPEN (CLIP) IMPLANT
COMPONENT HERO ACCESSORY KIT (VASCULAR PRODUCTS) ×2 IMPLANT
COMPONENT HERO ARTERIAL GRAFT (Vascular Products) ×2 IMPLANT
COMPONENT HERO VENOUS OVERFLOW (Vascular Products) ×2 IMPLANT
COVER PROBE FLX POLY STRL (MISCELLANEOUS) ×2 IMPLANT
COVER WAND RF STERILE (DRAPES) ×2 IMPLANT
DERMABOND ADVANCED (GAUZE/BANDAGES/DRESSINGS) ×4
DERMABOND ADVANCED .7 DNX12 (GAUZE/BANDAGES/DRESSINGS) ×1 IMPLANT
DEVICE PRESTO INFLATION (MISCELLANEOUS) ×1 IMPLANT
DEVICE TORQUE .025-.038 (MISCELLANEOUS) ×1 IMPLANT
DRAPE C-ARM XRAY 36X54 (DRAPES) ×2 IMPLANT
DRAPE INCISE IOBAN 66X45 STRL (DRAPES) ×2 IMPLANT
DRAPE SHEET LG 3/4 BI-LAMINATE (DRAPES) ×2 IMPLANT
DRESSING SURGICEL FIBRLLR 1X2 (HEMOSTASIS) ×1 IMPLANT
DRSG SURGICEL FIBRILLAR 1X2 (HEMOSTASIS) ×2
DRSG TEGADERM 2-3/8X2-3/4 SM (GAUZE/BANDAGES/DRESSINGS) ×2 IMPLANT
ELECT CAUTERY BLADE 6.4 (BLADE) ×2 IMPLANT
ELECT REM PT RETURN 9FT ADLT (ELECTROSURGICAL) ×2
ELECTRODE REM PT RTRN 9FT ADLT (ELECTROSURGICAL) ×1 IMPLANT
GLIDEWIRE STIFF .35X180X3 HYDR (WIRE) ×1 IMPLANT
GLOVE BIO SURGEON STRL SZ7 (GLOVE) ×2 IMPLANT
GLOVE INDICATOR 7.5 STRL GRN (GLOVE) ×2 IMPLANT
GLOVE SURG SYN 8.0 (GLOVE) ×2 IMPLANT
GLOVE SURG SYN 8.0 PF PI (GLOVE) ×1 IMPLANT
GOWN STRL REUS W/ TWL LRG LVL3 (GOWN DISPOSABLE) ×2 IMPLANT
GOWN STRL REUS W/ TWL XL LVL3 (GOWN DISPOSABLE) ×1 IMPLANT
GOWN STRL REUS W/TWL LRG LVL3 (GOWN DISPOSABLE) ×4
GOWN STRL REUS W/TWL XL LVL3 (GOWN DISPOSABLE) ×2
GUIDEWIRE SUPER STIFF .035X180 (WIRE) ×1 IMPLANT
IV NS 500ML (IV SOLUTION) ×2
IV NS 500ML BAXH (IV SOLUTION) ×1 IMPLANT
KIT TURNOVER KIT A (KITS) ×2 IMPLANT
LABEL OR SOLS (LABEL) ×2 IMPLANT
LOOP RED MAXI  1X406MM (MISCELLANEOUS) ×2
LOOP VESSEL MAXI 1X406 RED (MISCELLANEOUS) ×2 IMPLANT
LOOP VESSEL MINI 0.8X406 BLUE (MISCELLANEOUS) ×1 IMPLANT
LOOPS BLUE MINI 0.8X406MM (MISCELLANEOUS) ×1
NDL FILTER BLUNT 18X1 1/2 (NEEDLE) ×1 IMPLANT
NDL HYPO 25X1 1.5 SAFETY (NEEDLE) IMPLANT
NEEDLE FILTER BLUNT 18X 1/2SAF (NEEDLE) ×1
NEEDLE FILTER BLUNT 18X1 1/2 (NEEDLE) ×1 IMPLANT
NEEDLE HYPO 25X1 1.5 SAFETY (NEEDLE) IMPLANT
NS IRRIG 500ML POUR BTL (IV SOLUTION) ×2 IMPLANT
PACK ANGIOGRAPHY (CUSTOM PROCEDURE TRAY) ×1 IMPLANT
PACK BASIN MAJOR ARMC (MISCELLANEOUS) ×2 IMPLANT
PACK UNIVERSAL (MISCELLANEOUS) ×2 IMPLANT
PAD PREP 24X41 OB/GYN DISP (PERSONAL CARE ITEMS) ×2 IMPLANT
SHEATH PINNACLE 11FRX10 (SHEATH) ×1 IMPLANT
STOCKINETTE 48X4 2 PLY STRL (GAUZE/BANDAGES/DRESSINGS) ×1 IMPLANT
STOCKINETTE STRL 4IN 9604848 (GAUZE/BANDAGES/DRESSINGS) ×2 IMPLANT
SUT ETHIBOND 0 (SUTURE) ×1 IMPLANT
SUT GTX CV-6 30 (SUTURE) ×5 IMPLANT
SUT MNCRL+ 5-0 UNDYED PC-3 (SUTURE) ×1 IMPLANT
SUT MON AB 5-0 P3 18 (SUTURE) ×1 IMPLANT
SUT MONOCRYL 5-0 (SUTURE) ×1
SUT PROLENE 6 0 BV (SUTURE) ×4 IMPLANT
SUT SILK 2 0 (SUTURE) ×2
SUT SILK 2 0 SH (SUTURE) ×2 IMPLANT
SUT SILK 2-0 18XBRD TIE 12 (SUTURE) ×1 IMPLANT
SUT SILK 3 0 (SUTURE) ×2
SUT SILK 3-0 18XBRD TIE 12 (SUTURE) ×1 IMPLANT
SUT SILK 4 0 (SUTURE) ×2
SUT SILK 4-0 18XBRD TIE 12 (SUTURE) ×1 IMPLANT
SUT VIC AB 3-0 SH 27 (SUTURE) ×2
SUT VIC AB 3-0 SH 27X BRD (SUTURE) ×1 IMPLANT
SYR 10ML LL (SYRINGE) IMPLANT
SYR 20CC LL (SYRINGE) ×2 IMPLANT
SYR 5ML LL (SYRINGE) ×2 IMPLANT
WIRE J 3MM .035X145CM (WIRE) ×1 IMPLANT

## 2019-01-30 NOTE — Transfer of Care (Signed)
Immediate Anesthesia Transfer of Care Note  Patient: Steven [name]  Procedure(s) Performed: Procedure(s): INSERTION OF HERO VASCULAR ACCESS DEVICE (Right)  Patient Location: PACU  Anesthesia Type:General  Level of Consciousness: sedated  Airway & Oxygen Therapy: Patient Spontanous Breathing and Patient connected to face mask oxygen  Post-op Assessment: Report given to RN and Post -op Vital signs reviewed and stable  Post vital signs: Reviewed and stable  Last Vitals:  Vitals:   01/30/19 1027 01/30/19 1515  BP: 131/79 119/73  Pulse: 72   Resp: 20 (!) 23  Temp: 37.2 C (!) 36.1 C  SpO2: 12% 87%    Complications: No apparent anesthesia complications

## 2019-01-30 NOTE — Anesthesia Preprocedure Evaluation (Signed)
Anesthesia Evaluation  Patient identified by MRN, date of birth, ID band Patient awake    Reviewed: Allergy & Precautions, H&P , NPO status , Patient's Chart, lab work & pertinent test results  History of Anesthesia Complications Negative for: history of anesthetic complications  Airway Mallampati: III  TM Distance: <3 FB Neck ROM: limited    Dental  (+) Chipped, Poor Dentition, Missing, Dental Advidsory Given   Pulmonary neg shortness of breath, sleep apnea , neg recent URI,           Cardiovascular Exercise Tolerance: Poor hypertension, (-) angina(-) CAD, (-) Past MI, (-) Cardiac Stents and (-) CABG (-) dysrhythmias (-) Valvular Problems/Murmurs     Neuro/Psych PSYCHIATRIC DISORDERS Depression negative neurological ROS     GI/Hepatic Neg liver ROS, GERD  Medicated and Controlled,  Endo/Other  diabetes, Type 2, Insulin Dependent  Renal/GU DialysisRenal disease     Musculoskeletal   Abdominal   Peds  Hematology negative hematology ROS (+)   Anesthesia Other Findings Past Medical History: No date: Anemia 03/01/2017: Cellulitis and abscess of right lower extremity No date: Chronic kidney disease     Comment:  dialysis m,w,f No date: Diabetes mellitus without complication ([place]) 09/47/0962: Edema extremities     Comment:  bilateral swelling No date: GERD (gastroesophageal reflux disease) No date: High cholesterol No date: High triglycerides No date: Hypertension No date: Neuropathy No date: Sleep apnea     Comment:  can't use cpap d/t feelings of suffocation  Past Surgical History: 08/27/2017: A/V FISTULAGRAM; Right     Comment:  Procedure: A/V FISTULAGRAM;  Surgeon: Katha Cabal, MD;  Location: [place] [facility];  Service:               Cardiovascular;  Laterality: Right; 2010: APPENDECTOMY 06/14/2017: AV FISTULA PLACEMENT; Right     Comment:  Procedure: ARTERIOVENOUS (AV) FISTULA  CREATION WRIST;                Surgeon: Katha Cabal, MD;  Location: [facility];                Service: Vascular;  Laterality: Right; 03/05/2017: DIALYSIS/PERMA CATHETER INSERTION; N/A     Comment:  Procedure: Dialysis/Perma Catheter Insertion;  Surgeon:               Katha Cabal, MD;  Location: [place] [facility];               Service: Cardiovascular;  Laterality: N/A; 09/20/2017: DIALYSIS/PERMA CATHETER INSERTION; N/A     Comment:  Procedure: DIALYSIS/PERMA CATHETER INSERTION;  Surgeon:               Katha Cabal, MD;  Location: [place] [facility];               Service: Cardiovascular;  Laterality: N/A; 03/29/2017: EXCHANGE OF A DIALYSIS CATHETER     Comment:  Procedure: Exchange Of A Dialysis Catheter;  Surgeon:               Katha Cabal, MD;  Location: [place] [facility];               Service: Cardiovascular;; 03/01/2017: IRRIGATION AND DEBRIDEMENT FOOT; Right     Comment:  Procedure: IRRIGATION AND DEBRIDEMENT FOOT;  Surgeon:               Albertine Patricia, DPM;  Location:  [facility];  Service:               Podiatry;  Laterality: Right;  application of wound vac 06/18/2018: PERIPHERAL VASCULAR THROMBECTOMY; Right     Comment:  Procedure: PERIPHERAL VASCULAR THROMBECTOMY;  Surgeon:               Algernon Huxley, MD;  Location: [place] [facility];                Service: Cardiovascular;  Laterality: Right;  BMI    Body Mass Index:  38.72 kg/m      Reproductive/Obstetrics negative OB ROS                             Anesthesia Physical  Anesthesia Plan  ASA: IV  Anesthesia Plan: General   Post-op Pain Management:    Induction: Intravenous  PONV Risk Score and Plan: Ondansetron, Dexamethasone, Midazolam and Treatment may vary due to age or medical condition  Airway Management Planned: LMA  Additional Equipment:   Intra-op Plan:   Post-operative Plan: Extubation in OR  Informed Consent: I have reviewed the  patients History and Physical, chart, labs and discussed the procedure including the risks, benefits and alternatives for the proposed anesthesia with the patient or authorized representative who has indicated his/her understanding and acceptance.     Dental Advisory Given  Plan Discussed with: Anesthesiologist, CRNA and Surgeon  Anesthesia Plan Comments: (Patient consented for risks of anesthesia including but not limited to:  - adverse reactions to medications - damage to teeth, lips or other oral mucosa - sore throat or hoarseness - Damage to heart, brain, lungs or loss of life  Patient voiced understanding.)        Anesthesia Quick Evaluation

## 2019-01-30 NOTE — Anesthesia Procedure Notes (Signed)
Procedure Name: Intubation Date/Time: 01/30/2019 1:08 PM Performed by: Nelda Marseille, CRNA Pre-anesthesia Checklist: Patient identified, Patient being monitored, Timeout performed, Emergency Drugs available and Suction available Patient Re-evaluated:Patient Re-evaluated prior to induction Oxygen Delivery Method: Circle system utilized Preoxygenation: Pre-oxygenation with 100% oxygen Induction Type: IV induction Ventilation: Mask ventilation without difficulty Laryngoscope Size: Mac, 3 and McGraph Grade View: Grade III Tube type: Oral Tube size: 7.5 mm Number of attempts: 1 Airway Equipment and Method: Stylet Placement Confirmation: ETT inserted through vocal cords under direct vision,  positive ETCO2 and breath sounds checked- equal and bilateral Secured at: 21 cm Tube secured with: Tape Dental Injury: Teeth and Oropharynx as per pre-operative assessment

## 2019-01-30 NOTE — Anesthesia Post-op Follow-up Note (Signed)
Anesthesia QCDR form completed.        

## 2019-01-30 NOTE — Discharge Summary (Addendum)
[facility] [place] NAME: Steven [name]    MR#:  [id number]  DATE OF BIRTH:  [birth year]  DATE OF ADMISSION:  01/26/2019 ADMITTING PHYSICIAN: Saundra Shelling, MD  DATE OF DISCHARGE: No discharge date for patient encounter.  PRIMARY CARE PHYSICIAN: [name], MD    ADMISSION DIAGNOSIS:  Peripheral edema [R60.9] End-stage renal disease on hemodialysis ([place]) [N18.6, Z99.2] Clotted dialysis access, initial encounter ([place]) [T82.49XA] Dialysis AV fistula malfunction ([place]) [T82.590A]  DISCHARGE DIAGNOSIS:  Principal Problem:   Complication of renal dialysis Active Problems:   Dialysis AV fistula malfunction ([place])   Anemia due to chronic kidney disease, on chronic dialysis ([place])   Clotted dialysis access [facility])   SECONDARY DIAGNOSIS:   Past Medical History:  Diagnosis Date  . Anemia   . Cellulitis and abscess of right lower extremity 03/01/2017  . Chronic kidney disease    dialysis m,w,f  . Diabetes mellitus without complication ([place])   . Edema extremities 03/01/2017   bilateral swelling  . GERD (gastroesophageal reflux disease)   . High cholesterol   . High triglycerides   . Hypertension   . Neuropathy   . Sleep apnea    can't use cpap d/t feelings of suffocation    HOSPITAL COURSE:    Thrombus  in [place] Patient is started on Eliquis which is held  for dialysis cath placement Patient  started on heparin drip without bolus and at the time of discharge we will discharge him with Eliquis 5 mg p.o. twice daily  -Nonfunctional dialysis catheter Patient had dialysis cath placement  in the right groin area by vascular surgery followed by hemodialysis today -Vascular surgery scheduled HeRO graft placement today and simultaneously right IJ tunneled catheter will be removed appreciate Dr. Delana Meyer recommendations.    Patient would go home with right femoral tunneled catheter to continue hemodialysis until graft is ready to  use -Outpatient follow-up with vascular surgery Dr. Delana Meyer in 7 to 10 days Cardiology cleared the patient for any further investigations and procedures.  A Myoview stress test performed on 01/28/2019 normal -Outpatient follow-up with [facility] in a week  -End-stage renal disease Nephrology consult for dialysis.  Seen by Dr. Candiss Norse.  Dialysis per nephrology I will continue her outpatient hemodialysis at [facility] center Monday Wednesday Friday -Follow-up with nephrology in a week or as recommended/as scheduled  -right pleural-based lung mass Seen by pulmonology Dr. Ashby Dawes CT chest Rounded soft tissue mass of the right lower lobe which has not changed significantly in size compared to the prior abdominal CT. Dr. Tasia Catchings is following the patient and considering PET scan inpatient as patient missed few appointments in the past, patient prefers outpatient PET scan Will get outpatient PET scan  -Noncompliance with  follow-up appointment   Reinforced the importance of being compliant with his medications and follow-up appointments.  Patient verbalized understanding   -Hypo magnesium  -Magnesium oxide 400 mg p.o. once today and continue over-the-counter magnesium for a week.  Plan of care discussed with nephrology Patient is getting hemodialysis  -Chronic diarrhea-outpatient follow-up with GI, patient refused inpatient GI consult   -Type 2 diabetes mellitus Diabetic diet Sliding scale coverage with insulin  -Generalized weakness physical therapy has evaluated the patient recommending outpatient physical therapy   -DVT prophylaxis subcu heparin  DISCHARGE CONDITIONS:   fair  CONSULTS OBTAINED:  Treatment Team:  Corey Skains, MD Earlie Server, MD   PROCEDURES right femoral tunneled catheter placement HeRO graft  placement 01/30/2019  DRUG ALLERGIES:   Allergies  Allergen Reactions  . Codeine Nausea And Vomiting    DISCHARGE MEDICATIONS:   Allergies as of  01/30/2019      Reactions   Codeine Nausea And Vomiting      Medication List    STOP taking these medications   aspirin 325 MG tablet Replaced by:  aspirin EC 81 MG tablet     TAKE these medications   accu-chek multiclix lancets Use as instructed.  check blood sugar up to 3 times a day Disp:  1 box   acetaminophen 325 MG tablet Commonly known as:  TYLENOL Take 2 tablets (650 mg total) by mouth every 6 (six) hours as needed for mild pain (or Fever >/= 101).   amitriptyline 25 MG tablet Commonly known as:  ELAVIL Take 25 mg by mouth at bedtime.   apixaban 5 MG Tabs tablet Commonly known as:  Eliquis Take 1 tablet (5 mg total) by mouth 2 (two) times daily.   aspirin EC 81 MG tablet Take 1 tablet (81 mg total) by mouth daily. Replaces:  aspirin 325 MG tablet   calcium acetate 667 MG capsule Commonly known as:  PHOSLO Take 2 capsules (1,334 mg total) by mouth 3 (three) times daily with meals.   clopidogrel 75 MG tablet Commonly known as:  PLAVIX Take 75 mg by mouth daily.   furosemide 80 MG tablet Commonly known as:  LASIX Take 80 mg by mouth daily.   gabapentin 300 MG capsule Commonly known as:  NEURONTIN Take 1 capsule (300 mg total) by mouth daily.   glipiZIDE 5 MG tablet Commonly known as:  GLUCOTROL Take 5 mg by mouth daily before breakfast.   glucose blood test strip Commonly known as:  Accu-Chek Aviva Use as instructed.use to check blood sugar up to 3 times a day  Disp: 1 box   insulin aspart 100 UNIT/ML injection Commonly known as:  novoLOG Inject 6 Units into the skin 3 (three) times daily with meals. What changed:  how much to take   insulin detemir 100 UNIT/ML injection Commonly known as:  LEVEMIR Inject 0.18 mLs (18 Units total) into the skin daily. What changed:  how much to take   Insulin Pen Needle 31G X 8 MM Misc 1 Container by Does not apply route QID. qid--any brand   Insulin Syringes (Disposable) U-100 1 ML Misc Use as directed with  insulin   magnesium 30 MG tablet Take 1 tablet (30 mg total) by mouth 2 (two) times daily for 7 days.   metoprolol tartrate 25 MG tablet Commonly known as:  LOPRESSOR Take 0.5 tablets (12.5 mg total) by mouth 2 (two) times daily.   midodrine 10 MG tablet Commonly known as:  PROAMATINE Take 1 tablet (10 mg total) by mouth 2 (two) times daily with a meal. What changed:    when to take this  reasons to take this   mirtazapine 15 MG tablet Commonly known as:  REMERON Take 15 mg by mouth at bedtime.   mometasone-formoterol 100-5 MCG/ACT Aero Commonly known as:  Dulera Inhale 2 puffs into the lungs every morning.   omeprazole 20 MG tablet Commonly known as:  PRILOSEC OTC Take 1 tablet (20 mg total) by mouth daily.   ProAir HFA 108 (90 Base) MCG/ACT inhaler Generic drug:  albuterol Inhale 2 puffs into the lungs every 4 (four) hours as needed for wheezing or shortness of breath.   senna-docusate 8.6-50 MG tablet Commonly known as:  Senokot-S Take 1 tablet by mouth at bedtime as needed for mild constipation.   sodium bicarbonate 650 MG tablet Take 1 tablet (650 mg total) by mouth 2 (two) times daily.   spironolactone 25 MG tablet Commonly known as:  ALDACTONE Take 25 mg by mouth daily.   traMADol 50 MG tablet Commonly known as:  ULTRAM Take 1 tablet (50 mg total) by mouth every 6 (six) hours as needed for moderate pain or severe pain.        DISCHARGE INSTRUCTIONS:  Follow-up with primary care physician in 3 days Follow-up with cardiology Dr. Nehemiah Massed in a week Follow-up with nephrology Dr. Candiss Norse in 5 to 7 days or as scheduled Follow-up with vascular surgery Dr. Delana Meyer in 10 days Follow-up with gastroenterology Dr. Alice Reichert in 2 weeks for chronic diarrhea Follow-up with Dr. Tasia Catchings oncology as recommended in 5 days Follow-up with pulmonology Dr. Ashby Dawes in 2 weeks Continue hemodialysis on Monday, Wednesday and Friday   DIET:  Renal diet  DISCHARGE CONDITION:   Fair  ACTIVITY:  Activity as tolerated  OXYGEN:  Home Oxygen: No.   Oxygen Delivery: room air  DISCHARGE LOCATION:  home   If you experience worsening of your admission symptoms, develop shortness of breath, life threatening emergency, suicidal or homicidal thoughts you must seek medical attention immediately by calling 911 or calling your MD immediately  if symptoms less severe.  You Must read complete instructions/literature along with all the possible adverse reactions/side effects for all the Medicines you take and that have been prescribed to you. Take any new Medicines after you have completely understood and accpet all the possible adverse reactions/side effects.   Please note  You were cared for by a hospitalist during your hospital stay. If you have any questions about your discharge medications or the care you received while you were in the hospital after you are discharged, you can call the unit and asked to speak with the hospitalist on call if the hospitalist that took care of you is not available. Once you are discharged, your primary care physician will handle any further medical issues. Please note that NO REFILLS for any discharge medications will be authorized once you are discharged, as it is imperative that you return to your primary care physician (or establish a relationship with a primary care physician if you do not have one) for your aftercare needs so that they can reassess your need for medications and monitor your lab values.     Today  Chief Complaint  Patient presents with  . Vascular Access Problem   Patient is doing fine.  In good spirits.  Desperately wants to go home after graft placement and hemodialysis today.  Vascular surgery and nephrology are agreeable Patient prefers getting PET scan as an outpatient Plan is to discharge him home with a right femoral tunneled dialysis catheter until graft is mature to use  ROS:  CONSTITUTIONAL: Denies  fevers, chills. Denies any fatigue, weakness.  EYES: Denies blurry vision, double vision, eye pain. EARS, NOSE, THROAT: Denies tinnitus, ear pain, hearing loss. RESPIRATORY: Denies cough, wheeze, shortness of breath.  CARDIOVASCULAR: Denies chest pain, palpitations, edema.  GASTROINTESTINAL: Denies nausea, vomiting, diarrhea, abdominal pain. Denies bright red blood per rectum. GENITOURINARY: Denies dysuria, hematuria. ENDOCRINE: Denies nocturia or thyroid problems. HEMATOLOGIC AND LYMPHATIC: Denies easy bruising or bleeding. SKIN: Denies rash or lesion. MUSCULOSKELETAL: Denies pain in neck, back, shoulder, knees, hips or arthritic symptoms.  NEUROLOGIC: Denies paralysis, paresthesias.  PSYCHIATRIC: Denies anxiety or depressive  symptoms.   VITAL SIGNS:  Blood pressure 131/79, pulse 72, temperature 98.9 F (37.2 C), temperature source Temporal, resp. rate 20, height 5\' 10"  (1.778 m), weight 117.9 kg, SpO2 96 %.  I/O:    Intake/Output Summary (Last 24 hours) at 01/30/2019 1323 Last data filed at 01/30/2019 0700 Gross per 24 hour  Intake 878.09 ml  Output 1800 ml  Net -921.91 ml    PHYSICAL EXAMINATION:  GENERAL:  58 y.o.-year-old patient lying in the bed with no acute distress.  EYES: Pupils equal, round, reactive to light and accommodation. No scleral icterus. Extraocular muscles intact.  HEENT: Head atraumatic, normocephalic. Oropharynx and nasopharynx clear.  NECK:  Supple, no jugular venous distention. No thyroid enlargement, no tenderness.  LUNGS: Normal breath sounds bilaterally, no wheezing, rales,rhonchi or crepitation. No use of accessory muscles of respiration.  CARDIOVASCULAR: S1, S2 normal. No murmurs, rubs, or gallops.  ABDOMEN: Soft, non-tender, non-distended. Bowel sounds present. No organomegaly or mass.  EXTREMITIES: Right femoral tunneled catheter no pedal edema, cyanosis, or clubbing.  NEUROLOGIC: Awake alert and oriented x3 sensation intact. Gait not checked.   PSYCHIATRIC: The patient is alert and oriented x 3.  SKIN: No obvious rash, lesion, or ulcer.   DATA REVIEW:   CBC Recent Labs  Lab 01/30/19 0433  WBC 3.9*  HGB 8.4*  HCT 27.2*  PLT 125*    Chemistries  Recent Labs  Lab 01/30/19 0433  NA 135  K 4.0  CL 99  CO2 24  GLUCOSE 167*  BUN 29*  CREATININE 4.88*  CALCIUM 7.9*  MG 1.4*    Cardiac Enzymes No results for input(s): TROPONINI in the last 168 hours.  Microbiology Results  Results for orders placed or performed during the hospital encounter of 01/26/19  MRSA PCR Screening     Status: None   Collection Time: 01/26/19 10:35 PM  Result Value Ref Range Status   MRSA by PCR NEGATIVE NEGATIVE Final    Comment:        The GeneXpert MRSA Assay (FDA approved for NASAL specimens only), is one component of a comprehensive MRSA colonization surveillance program. It is not intended to diagnose MRSA infection nor to guide or monitor treatment for MRSA infections. Performed at [facility], [place], [place] [address]     RADIOLOGY:  Ct Chest Wo Contrast  Result Date: 01/27/2019 CLINICAL DATA:  58 year old male with pleural based lung mass EXAM: CT CHEST WITHOUT CONTRAST TECHNIQUE: Multidetector CT imaging of the chest was performed following the standard protocol without IV contrast. COMPARISON:  Abdominal CT 10/17/2018 FINDINGS: Cardiovascular: Heart size within normal limits. No pericardial fluid/thickening. Calcifications of the left main, left anterior descending, circumflex, right coronary arteries. Unremarkable course caliber and contour of the thoracic aorta. Main pulmonary artery measures 3.9 cm. No significant enlargement of the pulmonary arteries as they extend toward the outer 1/3 of the lung. Mediastinum/Nodes: Small lymph nodes of the mediastinum. No hilar or subcarinal lymph nodes. Unremarkable course of the thoracic esophagus. Hemodialysis catheter from IJ approach terminates in the  upper right atrium. There is a second hemodialysis catheter terminating in the right atrium from a femoral approach. This was not present on the prior plain film. Lungs/Pleura: Rounded soft tissue at the posterior aspect of the right lower lobe, 4.2 cm on axial images, 5.8 cm on parasagittal images, with similar configuration to the comparison abdominal CT. There is associated volume loss/atelectasis with some focal calcifications within the adjacent lung parenchyma. Trace right-sided pleural  fluid/thickening, unchanged from the comparison. No pneumothorax. No confluent airspace disease of the right upper lobe or the left lung. No endotracheal or endobronchial debris. Upper Abdomen: No acute finding of the upper abdomen. Liver steatosis Musculoskeletal: No acute displaced fracture. Sclerotic changes at the superior endplate of T6 with no acute fracture line identified. Partially healed rib fractures on the left of anterior fifth, seventh, eighth ribs partially healed right-sided rib fractures of 8 and 9. IMPRESSION: Rounded soft tissue mass of the right lower lobe which has not changed significantly in size compared to the prior abdominal CT. Differential again includes both malignancy and rounded atelectasis. Follow-up/surveillance imaging to consider would be both contrast-enhanced CT versus PET-CT. Coronary artery disease. Electronically Signed   By: Corrie Mckusick D.O.   On: 01/27/2019 15:41   Nm Myocar Multi W/spect W/wall Motion / Ef  Result Date: 01/28/2019  The study is normal.  This is a low risk study.  The left ventricular ejection fraction is normal (55-65%).  Blood pressure demonstrated a normal response to exercise.  There was no ST segment deviation noted during stress.     EKG:   Orders placed or performed during the hospital encounter of 01/26/19  . ED EKG  . ED EKG      Management plans discussed with the patient, he is in agreement.  CODE STATUS:     Code Status Orders   (From admission, onward)         Start     Ordered   01/26/19 2043  Full code  Continuous     01/26/19 2042        Code Status History    Date Active Date Inactive Code Status Order ID Comments User Context   12/15/2018 1802 12/17/2018 1653 Full Code [id number]  Rhetta Mura, DO ED   10/17/2018 1727 10/19/2018 1610 Full Code [id number]  Barton Dubois, MD Inpatient   09/12/2018 1659 09/14/2018 2018 Full Code [id number]  Henreitta Leber, MD Inpatient   06/19/2018 1609 06/24/2018 1620 Full Code [id number]  Bettey Costa, MD Inpatient   09/18/2017 1437 09/20/2017 2103 Full Code [id number]  Demetrios Loll, MD Inpatient   02/27/2017 2054 03/07/2017 2137 Full Code [id number]  Vaughan Basta, MD Inpatient      TOTAL TIME TAKING CARE OF THIS PATIENT: 45 minutes.   Note: This dictation was prepared with Dragon dictation along with smaller phrase technology. Any transcriptional errors that result from this process are unintentional.   @[facility] @  on 01/30/2019 at 1:23 PM  Between 7am to 6pm - Pager - [phone]  After 6pm go to [email or web address] - password [email or web address] [facility]  Office  [phone]  CC: Primary care physician; [name], MD

## 2019-01-30 NOTE — Op Note (Signed)
OPERATIVE NOTE   PROCEDURE: 1. Creation of right arm prosthetic A-V graft, HERO graft 2. Placement of venous outflow, intravascular portion of HERO graft same venous access 3. Introduction catheter into superior vena cava 4. Percutaneous transluminal angioplasty to innominate vein and superior vena cava. 5. 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  POST-OPERATIVE DIAGNOSIS: Same  SURGEON: Hortencia Pilar   ASSISTANT(S): Ms. Hezzie Bump  ANESTHESIA: general  ESTIMATED BLOOD LOSS: 75 cc  FINDING(S): 1.  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 y.o. male who presents with catheter based access. The patient has undergone evaluation in preparation for a upper arm access. The patient appears to be a candidate for a HERO graft. The risks and benefits as well as alternatives have been reviewed in detail with the patient. The patient has had multiple upper extremity access in the past. All questions are answered. Patient voices understanding and wishes to proceed with right arm hero graft area  DESCRIPTION: After obtaining full informed written consent, the patient was brought back to the operating room and placed supine upon the operating table.  The patient received IV antibiotics prior to induction.  After obtaining adequate anesthesia, the patient was positioned supine with his neck extended slightly and rotated to the right the left neck chest wall and entire left arm is then prepped and draped in the standard fashion appropriate time out is called.    A first assistant was required to help with retraction and obtaining hemostasis.  Also assisting with tunneling of the graft  and closing of the wounds.  Small transverse incision is created over the catheter at the base of the neck.  Dissection is carried down until the catheter is identified is then grasped with a Claiborne Billings and delivered into the surgical field.  Is then transected and an Amplatz wire is advanced through the catheter and into the inferior vena cava under fluoroscopic guidance.  The intravascular portion of the catheter was removed.  Attention is then turned to the remaining segment of the catheter which is freed from the surrounding tissues at the level of the cuff and then passed off the field.  Having completely excised the catheter and maintained venous control through the same venous access attention was returned to evaluating the venous disease.  An 73 French sheath is placed. Hand injection contrast is then utilized to demonstrate the central venous anatomy and a 80% stenosis of the right innominate vein extending into the superior vena cava proper is noted.   A 8 mm x 60 mm [place] balloon is then selected and advanced over the Amplatz wire across the lesion and inflated to 16 atm for 1 minute. Follow-up imaging demonstrates a significant improvement with less than 20% residual stenosis. This will allow for passage of the venous outflow component of the HERO graft.  The venous outflow component and the associated sheath and dilators are then opened on the sterile field and prepped in the usual fashion. 8 French sheath was removed and the dilators were advanced over the wire and subsequently the dilator and peel-away sheath is inserted under direct fluoroscopic guidance. The venous outflow portion of the HERO graft is then advanced over the wire through the peel-away sheath without  difficulty and under fluoroscopic guidance positioned with its tip well into the inferior vena cava. Peel-away sheath is removed as is the dilator wire and the outflow component is flushed copiously with heparinized saline and  clamped.  Attention is then turned to the brachial artery. A linear incision is made at about the mid biceps level and the dissection is carried down to expose opposed the muscle the sheath is then opened, muscle reflected laterally and the neurovascular bundle identified. The brachial artery is localized and dissected circumferentially and Silastic vessel loops are placed proximally and distally.  A second incision is then made in the deltopectoral groove and the dissection is carried down through the soft tissues to expose the fascia covering the pectoralis muscle. Using the Bard tunneling device a tunnel was then created from the brachial artery exposure to the deltopectoral incision and subsequently a PTF portion of the hero graft is pulled through the subcutaneous tunnel.   Radiology is returned to the OR and under real-time visualization the venous outflow component is pulled back so that the tip is in the mid atrium it is then tunneled from the neck counterincision to the deltopectoral incision and connected to the PTFE portion of the hero graft without difficulty. 0 Ethibond is used to secure this connection and secured the hero graft to the fascia at the level of the deltoid.  The graft is approximated to the brachial artery and then trimmed to an appropriate bevel. Brachial artery was then controlled with the vessel loops, arteriotomy is made and extended with Potts scissors. 6-0 Prolene stay suture is placed and an end graft to side brachial artery anastomosis fashioned with CV 6 Gore suture. Flushing maneuvers were performed and flow is reestablished to the hand.   Flow was now established through the graft and an excellent thrill is noted. Radial pulses maintain.  All 3 wounds were then irrigated with sterile saline the brachial as well as the deltopectoral incisions are closed in layers using 3-0 Vicryl for the subcutaneous layers followed by 4-0 Monocryl subcuticular to close skin. Neck  counterincision was closed with 4-0 Monocryl subcuticular. Dermabond is applied to all 3 incisions.  Patient tolerated procedure well there were no immediate complications. Taken to the recovery room in good condition.    COMPLICATIONS: None  CONDITION: Good  Hortencia Pilar. [facility] Office: [phone]  01/30/2019,3:13 PM

## 2019-01-30 NOTE — Progress Notes (Signed)
[facility], [place] 01/30/19  Subjective:   Patient known to our practice from outpatient dialysis Presents because dialysis cathter would not function in outpatient setting despite tPA infusion on Friday Underwent new permcath placement on Monday which also did not work because of clot in SVC tPA thrombolysis of SVC clot was attempted without success Therefore, a right femoral dialysis cathter was placed Patient underwent dialysis on Tuesday and Thursday  Underwent Rt Arm HERO graft placement Some nausea after Anesthesia and surgery Does not feel comfortable driving to [place] after HD   Objective:  Vital signs in last 24 hours:  Temp:  [96.9 F (36.1 C)-98.9 F (37.2 C)] 97 F (36.1 C) (04/24 1545) Pulse Rate:  [72-90] 80 (04/24 1545) Resp:  [14-23] 14 (04/24 1545) BP: (105-137)/(68-95) 131/72 (04/24 1545) SpO2:  [95 %-100 %] 100 % (04/24 1545)  Weight change:  Filed Weights   01/27/19 1019 01/27/19 1400 01/29/19 1342  Weight: 115.7 kg 114.3 kg 117.9 kg    Intake/Output:    Intake/Output Summary (Last 24 hours) at 01/30/2019 1726 Last data filed at 01/30/2019 1455 Gross per 24 hour  Intake 565.81 ml  Output 325 ml  Net 240.81 ml     Physical Exam: General: Well appearing  HEENT anicteric  Neck Supple  Pulm/lungs Clear, normal effort on room air  CVS/Heart No rub  Abdomen:  Soft, NT, non distended  Extremities: + edema b/l  Neurologic: Alert, oriented  Skin: No acute rashes  Access: Rt arm HERO 01/30/2019, Rt femoral permcath       Basic Metabolic Panel:  Recent Labs  Lab 01/26/19 1052 01/27/19 0310 01/28/19 0601 01/29/19 0545 01/30/19 0433  NA 122* 127* 134* 135 135  K 5.4* 5.0 4.2 4.5 4.0  CL 87* 94* 99 100 99  CO2 14* 17* 25 25 24   GLUCOSE 176* 134* 114* 136* 167*  BUN 91* 95* 48* 49* 29*  CREATININE 8.43* 8.69* 6.50* 6.54* 4.88*  CALCIUM 8.9 8.4* 8.2* 8.4* 7.9*  MG  --   --   --   --  1.4*      CBC: Recent Labs  Lab 01/26/19 1052 01/27/19 0310 01/28/19 0601 01/29/19 0545 01/30/19 0433  WBC 3.1* 3.9* 2.8* 3.2* 3.9*  NEUTROABS 1.8  --   --   --   --   HGB 11.9* 9.9* 8.9* 8.9* 8.4*  HCT 35.5* 29.8* 27.7* 28.5* 27.2*  MCV 90.6 91.7 94.2 95.0 94.8  PLT 175 139* 126* 122* 125*      Lab Results  Component Value Date   HEPBSAG Negative 10/17/2018   HEPBSAB Non Reactive 02/28/2017   HEPBIGM Negative 02/28/2017      Microbiology:  Recent Results (from the past 240 hour(s))  MRSA PCR Screening     Status: None   Collection Time: 01/26/19 10:35 PM  Result Value Ref Range Status   MRSA by PCR NEGATIVE NEGATIVE Final    Comment:        The GeneXpert MRSA Assay (FDA approved for NASAL specimens only), is one component of a comprehensive MRSA colonization surveillance program. It is not intended to diagnose MRSA infection nor to guide or monitor treatment for MRSA infections. Performed at [facility], [address] [place], [place] [address]     Coagulation Studies: Recent Labs    01/30/19 0433  LABPROT 12.8  INR 1.0    Urinalysis: No results for input(s): COLORURINE, LABSPEC, PHURINE, GLUCOSEU, HGBUR, BILIRUBINUR, KETONESUR, PROTEINUR, UROBILINOGEN, NITRITE,  LEUKOCYTESUR in the last 72 hours.  Invalid input(s): APPERANCEUR    Imaging: Dg C-arm 1-60 Min-no Report  Result Date: 01/30/2019 Fluoroscopy was utilized by the requesting physician.  No radiographic interpretation.     Medications:   . amitriptyline  25 mg Oral QHS  . apixaban  2.5 mg Oral BID  . calcium acetate  1,334 mg Oral TID WC  . Chlorhexidine Gluconate Cloth  6 each Topical Q0600  . gabapentin  300 mg Oral Daily  . insulin aspart  0-5 Units Subcutaneous QHS  . insulin aspart  0-9 Units Subcutaneous TID WC  . insulin aspart  6 Units Subcutaneous TID WC  . insulin detemir  18 Units Subcutaneous Daily  . magnesium oxide  400 mg Oral Once  . metoprolol tartrate   12.5 mg Oral BID  . midodrine  10 mg Oral BID WC  . mirtazapine  15 mg Oral QHS  . mometasone-formoterol  2 puff Inhalation BH-q7a  . pantoprazole  40 mg Oral Daily  . sodium chloride flush  3 mL Intravenous Q12H     Assessment/ Plan:  58 y.o. Caucasian male with ESRD hypertension, diabetes mellitus type 2 insulin dependent, hyperlipidemia, GERD, and morbid obesity  [facility]/MWF/112kg.  1. Complication of dialysis access 2. Hyperkalemia 3. LE edema 4. ESRD, dialysis dependent 5. SVC  Thrombosis 6. 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  Anemia of chronic kidney disease 8.  Secondary hyperparathyroidism   Plan: Dialysis tomorrrow via rt fem permcath, then resume outpatient regimen rt arm HERO graft placed today.  Patient is also interested in learning about PD-we will arrange for this as outpatient  EPO with HD Heparin and subsequently Eliquis for anticoagulation Repeat CT chest shows no change in the mass.  Appreciate oncology evaluation.  PET scan ordered Liberal diet since patient has not been eating well at home Phoslo with meals for phosphorus binding    LOS: [place] 4/24/20205:26 PM  [place], [place]  Note: This note was prepared with Dragon dictation. Any transcription errors are unintentional

## 2019-01-30 NOTE — Discharge Instructions (Signed)
Follow-up with primary care physician in 3 days Follow-up with cardiology Dr. Nehemiah Massed in a week Follow-up with nephrology Dr. Candiss Norse in 5 to 7 days or as scheduled Follow-up with vascular surgery Dr. Delana Meyer in 10 days Follow-up with gastroenterology Dr. Alice Reichert in 2 weeks for chronic diarrhea Follow-up with Dr. Tasia Catchings oncology as recommended in 5 days Follow-up with pulmonology Dr. Ashby Dawes in 2 weeks Continue hemodialysis on Monday, Wednesday and Friday

## 2019-01-30 NOTE — H&P (Signed)
[facility] History & Physical Update  The patient was interviewed and re-examined.  The patient's previous History and Physical has been reviewed and is unchanged.  There is no change in the plan of care. We plan to proceed with the scheduled procedure.  Hortencia Pilar, MD  01/30/2019, 11:56 AM

## 2019-01-31 LAB — BASIC METABOLIC PANEL
Anion gap: 10 (ref 5–15)
BUN: 38 mg/dL — ABNORMAL HIGH (ref 6–20)
CO2: 22 mmol/L (ref 22–32)
Calcium: 8.2 mg/dL — ABNORMAL LOW (ref 8.9–10.3)
Chloride: 99 mmol/L (ref 98–111)
Creatinine, Ser: 5.43 mg/dL — ABNORMAL HIGH (ref 0.61–1.24)
GFR calc Af Amer: 12 mL/min — ABNORMAL LOW (ref >60)
GFR calc non Af Amer: 11 mL/min — ABNORMAL LOW (ref >60)
Glucose, Bld: 362 mg/dL — ABNORMAL HIGH (ref 70–99)
Potassium: 4.8 mmol/L (ref 3.5–5.1)
Sodium: 131 mmol/L — ABNORMAL LOW (ref 135–145)

## 2019-01-31 LAB — CBC
HCT: 26.4 % — ABNORMAL LOW (ref 39.0–52.0)
Hemoglobin: 8.1 g/dL — ABNORMAL LOW (ref 13.0–17.0)
MCH: 29.9 pg (ref 26.0–34.0)
MCHC: 30.7 g/dL (ref 30.0–36.0)
MCV: 97.4 fL (ref 80.0–100.0)
Platelets: 134 10*3/uL — ABNORMAL LOW (ref 150–400)
RBC: 2.71 MIL/uL — ABNORMAL LOW (ref 4.22–5.81)
RDW: 15.5 % (ref 11.5–15.5)
WBC: 5 10*3/uL (ref 4.0–10.5)
nRBC: 0 % (ref 0.0–0.2)

## 2019-01-31 LAB — GLUCOSE, CAPILLARY
Glucose-Capillary: 278 mg/dL — ABNORMAL HIGH (ref 70–99)
Glucose-Capillary: 131 mg/dL — ABNORMAL HIGH (ref 70–99)

## 2019-01-31 MED ORDER — MAGNESIUM 30 MG PO TABS: 30.0000 mg | ORAL_TABLET | Freq: Two times a day (BID) | ORAL | 0 refills | Status: AC

## 2019-01-31 NOTE — Progress Notes (Signed)
HD Tx started w/o complication    70/11/00 0812  Vital Signs  Pulse Rate 81  Pulse Rate Source Monitor  Resp 13  BP (!) 147/86  BP Location Left Arm  BP Method Automatic  Patient Position (if appropriate) Lying  Oxygen Therapy  SpO2 100 %  O2 Device Room Air  During Hemodialysis Assessment  Blood Flow Rate (mL/min) 400 mL/min  Arterial Pressure (mmHg) -250 mmHg  Venous Pressure (mmHg) 180 mmHg  Transmembrane Pressure (mmHg) 60 mmHg  Ultrafiltration Rate (mL/min) 790 mL/min  Dialysate Flow Rate (mL/min) 800 ml/min  Conductivity: Machine  13.5  HD Safety Checks Performed Yes  Dialysis Fluid Bolus Normal Saline  Bolus Amount (mL) 250 mL  Intra-Hemodialysis Comments Tx initiated

## 2019-01-31 NOTE — Progress Notes (Signed)
HD Tx completed, tolerated. TX shortend for low B/P unrelieved with ssaline bolus, MD aware, present. Pt asymptomatic. Lost 10 min tx time    01/31/19 1115  Vital Signs  Pulse Rate 77  Pulse Rate Source Monitor  Resp 12  BP 96/62  BP Location Left Arm  BP Method Automatic  Patient Position (if appropriate) Lying  Oxygen Therapy  SpO2 100 %  O2 Device Room Air  During Hemodialysis Assessment  HD Safety Checks Performed Yes  KECN 67.2 KECN  Dialysis Fluid Bolus Normal Saline  Bolus Amount (mL) 250 mL  Intra-Hemodialysis Comments Tx completed;Tolerated well

## 2019-01-31 NOTE — TOC Transition Note (Signed)
Transition of Care [facility]) - CM/SW Discharge Note   Patient Details  Name: Steven [name] MRN: [id number] Date of Birth: [birth year]  [facility]) CM/SW Contact:  Shelbie Hutching, RN Phone Number: 01/31/2019, 10:39 AM   Clinical Narrative:       Final next level of care: Home/Self Care Barriers to Discharge: Barriers Resolved   Patient Goals and CMS Choice Patient states their goals for this hospitalization and ongoing recovery are:: Get it fixed      Discharge Placement                       Discharge Plan and Services                DME Arranged: N/A DME Agency: NA       HH Arranged: NA HH Agency: NA        Social Determinants of Health (SDOH) Interventions     Readmission Risk Interventions No flowsheet data found.

## 2019-01-31 NOTE — Progress Notes (Signed)
PT Cancellation Note  Patient Details Name: Steven [name] MRN: [id number] DOB: 1960-11-28   Cancelled Treatment:    Reason Eval/Treat Not Completed: Patient declined, no reason specified. Treatment attempted this morning, pt declines stating no need for PT and pt is planning on going home today. At the time of documentation, pt has discharge orders.    Larae Grooms, PTA 01/31/2019, 2:12 PM

## 2019-01-31 NOTE — Progress Notes (Signed)
Post HD Assessment    01/31/19 1123  Neurological  Level of Consciousness Alert  Orientation Level Oriented X4  Respiratory  Respiratory Pattern Regular  Chest Assessment Chest expansion symmetrical  Bilateral Breath Sounds Clear  Cough None  Cardiac  Pulse Regular  Heart Sounds S1, S2  ECG Monitor Yes  Cardiac Rhythm NSR  Vascular  R Radial Pulse +2  L Radial Pulse +2  Edema Generalized  Generalized Edema None  Psychosocial  Psychosocial (WDL) WDL

## 2019-01-31 NOTE — Progress Notes (Signed)
Pre HD Tx    01/31/19 0810  Hand-Off documentation  Report given to (Full Name) Beatris Ship, RN   Report received from (Full Name) [place]ble [place], Rn   Vital Signs  Temp 98.1 F (36.7 C)  Temp Source Oral  Pulse Rate 83  Pulse Rate Source Monitor  Resp 16  BP (!) 136/91  BP Location Left Arm  BP Method Automatic  Patient Position (if appropriate) Lying  Oxygen Therapy  SpO2 98 %  O2 Device Room Air  Pain Assessment  Pain Scale 0-10  Pain Score 0  Time-Out for Hemodialysis  What Procedure? HD  Pt Identifiers(min of two) First/Last Name  Correct Site? Yes  Correct Side? Yes  Correct Procedure? Yes  Consents Verified? Yes  Rad Studies Available? N/A  Safety Precautions Reviewed? Yes  Engineer, civil (consulting) Number 2  Station Number 1  UF/Alarm Test Passed  Conductivity: Meter 13.6  Conductivity: Machine  13.6  pH 7.4  Reverse Osmosis Main  Normal Saline Lot Number [id number]  Dialyzer Lot Number [id number]  Disposable Set Lot Number [id number]  Machine Temperature 98.6 F (37 C)  Musician and Audible Yes  Pre Treatment Patient Checks  Vascular access used during treatment Catheter  Hepatitis B Surface Antigen Results Negative  Date Hepatitis B Surface Antigen Drawn 12/25/18  Isolation Initiated Yes  Hepatitis B Surface Antibody  (<10)  Date Hepatitis B Surface Antibody Drawn 07/22/18  Hemodialysis Consent Verified Yes  Hemodialysis Standing Orders Initiated Yes  ECG (Telemetry) Monitor On Yes  Prime Ordered Normal Saline  Length of  DialysisTreatment -hour(s) 3.5 Hour(s)  Dialysis Treatment Comments Na 140  Dialyzer Elisio 17H NR  Dialysate 2K, 2.5 Ca  Dialysis Anticoagulant None  Dialysate Flow Ordered 800  Blood Flow Rate Ordered 400 mL/min  Ultrafiltration Goal 2 Liters  Dialysis Blood Pressure Support Ordered Normal Saline  Hemodialysis Catheter Right Femoral vein Double-lumen  Placement Date/Time: 01/27/19 1800   Orientation: Right  Access  Location: (c) Femoral vein  Hemodialysis Catheter Type: Double-lumen  Site Condition No complications  Blue Lumen Status Blood return noted  Red Lumen Status Blood return noted  Purple Lumen Status N/A  Dressing Status Clean;Dry;Intact

## 2019-01-31 NOTE — Progress Notes (Signed)
[facility], [place] 01/31/19  Subjective:   Patient known to our practice from outpatient dialysis Presents because dialysis cathter would not function in outpatient setting despite tPA infusion on Friday Underwent new permcath placement on Monday which also did not work because of clot in SVC tPA thrombolysis of SVC clot was attempted without success Therefore, a right femoral dialysis cathter was placed Patient underwent dialysis on Tuesday and Thursday  Underwent Rt Arm HERO graft placement 4/24     HEMODIALYSIS FLOWSHEET:  Blood Flow Rate (mL/min): 400 mL/min Arterial Pressure (mmHg): -250 mmHg Venous Pressure (mmHg): 180 mmHg Transmembrane Pressure (mmHg): 60 mmHg Ultrafiltration Rate (mL/min): 790 mL/min Dialysate Flow Rate (mL/min): 800 ml/min Conductivity: Machine : 14.2 Conductivity: Machine : 14.2 Dialysis Fluid Bolus: Normal Saline Bolus Amount (mL): 250 mL  Denies any new c/o   Objective:  Vital signs in last 24 hours:  Temp:  [96.9 F (36.1 C)-98.9 F (37.2 C)] 98.1 F (36.7 C) (04/25 0810) Pulse Rate:  [72-106] 79 (04/25 0915) Resp:  [0-23] 12 (04/25 0915) BP: (102-148)/(66-117) 116/97 (04/25 0915) SpO2:  [90 %-100 %] 98 % (04/25 0915)  Weight change:  Filed Weights   01/27/19 1019 01/27/19 1400 01/29/19 1342  Weight: 115.7 kg 114.3 kg 117.9 kg    Intake/Output:    Intake/Output Summary (Last 24 hours) at 01/31/2019 0924 Last data filed at 01/31/2019 0355 Gross per 24 hour  Intake 300 ml  Output 125 ml  Net 175 ml     Physical Exam: General: Well appearing  HEENT anicteric  Neck Supple  Pulm/lungs Clear, normal effort on room air  CVS/Heart No rub  Abdomen:  Soft, NT, non distended  Extremities: + edema b/l  Neurologic: Alert, oriented  Skin: No acute rashes  Access: Rt arm HERO 01/30/2019, Rt femoral permcath       Basic Metabolic Panel:  Recent Labs  Lab 01/27/19 0310 01/28/19 0601 01/29/19 0545  01/30/19 0433 01/31/19 0502  NA 127* 134* 135 135 131*  K 5.0 4.2 4.5 4.0 4.8  CL 94* 99 100 99 99  CO2 17* 25 25 24 22   GLUCOSE 134* 114* 136* 167* 362*  BUN 95* 48* 49* 29* 38*  CREATININE 8.69* 6.50* 6.54* 4.88* 5.43*  CALCIUM 8.4* 8.2* 8.4* 7.9* 8.2*  MG  --   --   --  1.4*  --      CBC: Recent Labs  Lab 01/26/19 1052 01/27/19 0310 01/28/19 0601 01/29/19 0545 01/30/19 0433 01/31/19 0502  WBC 3.1* 3.9* 2.8* 3.2* 3.9* 5.0  NEUTROABS 1.8  --   --   --   --   --   HGB 11.9* 9.9* 8.9* 8.9* 8.4* 8.1*  HCT 35.5* 29.8* 27.7* 28.5* 27.2* 26.4*  MCV 90.6 91.7 94.2 95.0 94.8 97.4  PLT 175 139* 126* 122* 125* 134*      Lab Results  Component Value Date   HEPBSAG Negative 10/17/2018   HEPBSAB Non Reactive 02/28/2017   HEPBIGM Negative 02/28/2017      Microbiology:  Recent Results (from the past 240 hour(s))  MRSA PCR Screening     Status: None   Collection Time: 01/26/19 10:35 PM  Result Value Ref Range Status   MRSA by PCR NEGATIVE NEGATIVE Final    Comment:        The GeneXpert MRSA Assay (FDA approved for NASAL specimens only), is one component of a comprehensive MRSA colonization surveillance program. It is not intended to diagnose MRSA infection nor  to guide or monitor treatment for MRSA infections. Performed at [facility], [address] [place], [place] [address]     Coagulation Studies: Recent Labs    01/30/19 0433  LABPROT 12.8  INR 1.0    Urinalysis: No results for input(s): COLORURINE, LABSPEC, PHURINE, GLUCOSEU, HGBUR, BILIRUBINUR, KETONESUR, PROTEINUR, UROBILINOGEN, NITRITE, LEUKOCYTESUR in the last 72 hours.  Invalid input(s): APPERANCEUR    Imaging: Dg C-arm 1-60 Min-no Report  Result Date: 01/30/2019 Fluoroscopy was utilized by the requesting physician.  No radiographic interpretation.     Medications:   . amitriptyline  25 mg Oral QHS  . apixaban  2.5 mg Oral BID  . calcium acetate  1,334 mg Oral TID WC  .  Chlorhexidine Gluconate Cloth  6 each Topical Q0600  . gabapentin  300 mg Oral Daily  . insulin aspart  0-5 Units Subcutaneous QHS  . insulin aspart  0-9 Units Subcutaneous TID WC  . insulin aspart  6 Units Subcutaneous TID WC  . insulin detemir  18 Units Subcutaneous Daily  . magnesium oxide  400 mg Oral Once  . metoprolol tartrate  12.5 mg Oral BID  . midodrine  10 mg Oral BID WC  . mirtazapine  15 mg Oral QHS  . mometasone-formoterol  2 puff Inhalation BH-q7a  . pantoprazole  40 mg Oral Daily  . sodium chloride flush  3 mL Intravenous Q12H     Assessment/ Plan:  58 y.o. Caucasian male with ESRD hypertension, diabetes mellitus type 2 insulin dependent, hyperlipidemia, GERD, and morbid obesity  [facility]/MWF/112kg.  1. Complication of dialysis access 2. Hyperkalemia 3. LE edema 4. ESRD, dialysis dependent 5. SVC  Thrombosis 6. 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  Anemia of chronic kidney disease 8.  Secondary hyperparathyroidism   Plan: Dialysis via rt fem permcath,  rt arm HERO graft placed 4/24  Patient is also interested in learning about PD-we will arrange for this as outpatient  EPO with HD Heparin and subsequently Eliquis for anticoagulation Repeat CT chest shows no change in the mass.  Appreciate oncology evaluation.  PET scan as per Oncology Liberal diet since patient has not been eating well at home Phoslo with meals for phosphorus binding    LOS: [place] 4/25/20209:24 AM  [place], [place]  Note: This note was prepared with Dragon dictation. Any transcription errors are unintentional

## 2019-01-31 NOTE — Progress Notes (Signed)
Pt discharged per MD order. Prescriptions given to pt. Discharge instructions reviewed with pt. Pt verbalized understanding. All questions answered to pt satisfaction. Pt taken to car in wheelchair. Pt to drive himself home.

## 2019-01-31 NOTE — Progress Notes (Signed)
Pre HD Assessment    01/31/19 0805  Neurological  Level of Consciousness Alert  Orientation Level Oriented X4  Respiratory  Respiratory Pattern Regular  Chest Assessment Chest expansion symmetrical  Bilateral Breath Sounds Clear;Diminished  Cough None  Cardiac  Pulse Regular  Heart Sounds S1, S2  ECG Monitor Yes  Cardiac Rhythm NSR  Vascular  R Radial Pulse +2  L Radial Pulse +2  Edema Generalized  Generalized Edema +1  Psychosocial  Psychosocial (WDL) WDL

## 2019-01-31 NOTE — Progress Notes (Signed)
Post HD Tx    01/31/19 1121  Hand-Off documentation  Report given to (Full Name) Syble Creek, RN   Report received from (Full Name) Beatris Ship, RN   Vital Signs  Temp 98.1 F (36.7 C)  Temp Source Oral  Pulse Rate 78  Pulse Rate Source Monitor  Resp 12  BP 94/68  BP Location Left Arm  BP Method Automatic  Patient Position (if appropriate) Lying  Oxygen Therapy  SpO2 100 %  O2 Device Room Air  Pain Assessment  Pain Scale 0-10  Pain Score 0  Post-Hemodialysis Assessment  Rinseback Volume (mL) 250 mL  KECN 67.2 V  Dialyzer Clearance Lightly streaked  Duration of HD Treatment -hour(s) 3.45 hour(s) (Tx shortend for low BP 10 min lost MD aware )  Hemodialysis Intake (mL) 500 mL  UF Total -Machine (mL) 2003 mL  Net UF (mL) 1503 mL  Tolerated HD Treatment Yes  Hemodialysis Catheter Right Femoral vein Double-lumen  Placement Date/Time: 01/27/19 1800   Orientation: Right  Access Location: (c) Femoral vein  Hemodialysis Catheter Type: Double-lumen  Site Condition No complications  Red Lumen Status Heparin locked  Purple Lumen Status Heparin locked  Catheter fill volume (Arterial) 3 cc  Catheter fill volume (Venous) 3  Dressing Type Gauze/Drain sponge  Dressing Status Clean;Dry;Intact  Post treatment catheter status Capped and Clamped

## 2019-02-01 ENCOUNTER — Encounter: Payer: Self-pay | Admitting: Vascular Surgery

## 2019-02-01 NOTE — Anesthesia Postprocedure Evaluation (Signed)
Anesthesia Post Note  Patient: Steven [name]  Procedure(s) Performed: INSERTION OF HERO VASCULAR ACCESS DEVICE (Right )  Patient location during evaluation: PACU Anesthesia Type: General Level of consciousness: awake and alert Pain management: pain level controlled Vital Signs Assessment: post-procedure vital signs reviewed and stable Respiratory status: spontaneous breathing, nonlabored ventilation, respiratory function stable and patient connected to nasal cannula oxygen Cardiovascular status: blood pressure returned to baseline and stable Postop Assessment: no apparent nausea or vomiting Anesthetic complications: no     Last Vitals:  Vitals:   01/31/19 1049 01/31/19 1159  BP: 95/61 100/68  Pulse: 77 97  Resp: 12 18  Temp:  36.8 C  SpO2:      Last Pain:  Vitals:   01/31/19 1159  TempSrc: Oral  PainSc:                  Martha Clan

## 2019-02-02 DIAGNOSIS — N186 End stage renal disease: Secondary | ICD-10-CM | POA: Diagnosis not present

## 2019-02-02 DIAGNOSIS — N2581 Secondary hyperparathyroidism of renal origin: Secondary | ICD-10-CM | POA: Diagnosis not present

## 2019-02-02 DIAGNOSIS — D509 Iron deficiency anemia, unspecified: Secondary | ICD-10-CM | POA: Diagnosis not present

## 2019-02-02 DIAGNOSIS — Z992 Dependence on renal dialysis: Secondary | ICD-10-CM | POA: Diagnosis not present

## 2019-02-02 DIAGNOSIS — D631 Anemia in chronic kidney disease: Secondary | ICD-10-CM | POA: Diagnosis not present

## 2019-02-04 DIAGNOSIS — N2581 Secondary hyperparathyroidism of renal origin: Secondary | ICD-10-CM | POA: Diagnosis not present

## 2019-02-04 DIAGNOSIS — D509 Iron deficiency anemia, unspecified: Secondary | ICD-10-CM | POA: Diagnosis not present

## 2019-02-04 DIAGNOSIS — N186 End stage renal disease: Secondary | ICD-10-CM | POA: Diagnosis not present

## 2019-02-04 DIAGNOSIS — Z992 Dependence on renal dialysis: Secondary | ICD-10-CM | POA: Diagnosis not present

## 2019-02-04 DIAGNOSIS — D631 Anemia in chronic kidney disease: Secondary | ICD-10-CM | POA: Diagnosis not present

## 2019-02-05 DIAGNOSIS — N186 End stage renal disease: Secondary | ICD-10-CM | POA: Diagnosis not present

## 2019-02-05 DIAGNOSIS — Z992 Dependence on renal dialysis: Secondary | ICD-10-CM | POA: Diagnosis not present

## 2019-02-09 ENCOUNTER — Encounter: Payer: Self-pay | Admitting: Vascular Surgery

## 2019-02-10 ENCOUNTER — Encounter (INDEPENDENT_AMBULATORY_CARE_PROVIDER_SITE_OTHER): Payer: Medicare Other

## 2019-02-10 ENCOUNTER — Other Ambulatory Visit: Payer: Self-pay

## 2019-02-10 ENCOUNTER — Emergency Department (HOSPITAL_COMMUNITY)
Admission: EM | Admit: 2019-02-10 | Discharge: 2019-02-10 | Disposition: A | Discharge status: Home / Self Care | Payer: Medicare Other | Source: Home / Self Care | Attending: Emergency Medicine | Admitting: Emergency Medicine

## 2019-02-10 ENCOUNTER — Ambulatory Visit (INDEPENDENT_AMBULATORY_CARE_PROVIDER_SITE_OTHER): Payer: Medicare Other | Admitting: Nurse Practitioner

## 2019-02-10 ENCOUNTER — Inpatient Hospital Stay (HOSPITAL_COMMUNITY)
Admission: EM | Admit: 2019-02-10 | Discharge: 2019-02-14 | DRG: 064 | Disposition: A | Discharge status: Skilled Nursing Facility | Payer: Medicare Other | Attending: Internal Medicine | Admitting: Internal Medicine

## 2019-02-10 ENCOUNTER — Encounter (HOSPITAL_COMMUNITY): Payer: Self-pay | Admitting: Emergency Medicine

## 2019-02-10 ENCOUNTER — Encounter (HOSPITAL_COMMUNITY): Payer: Self-pay

## 2019-02-10 DIAGNOSIS — S0083XA Contusion of other part of head, initial encounter: Secondary | ICD-10-CM | POA: Diagnosis not present

## 2019-02-10 DIAGNOSIS — R29810 Facial weakness: Secondary | ICD-10-CM | POA: Diagnosis present

## 2019-02-10 DIAGNOSIS — E1151 Type 2 diabetes mellitus with diabetic peripheral angiopathy without gangrene: Secondary | ICD-10-CM | POA: Diagnosis present

## 2019-02-10 DIAGNOSIS — R42 Dizziness and giddiness: Secondary | ICD-10-CM | POA: Diagnosis not present

## 2019-02-10 DIAGNOSIS — E1165 Type 2 diabetes mellitus with hyperglycemia: Secondary | ICD-10-CM | POA: Diagnosis not present

## 2019-02-10 DIAGNOSIS — I132 Hypertensive heart and chronic kidney disease with heart failure and with stage 5 chronic kidney disease, or end stage renal disease: Secondary | ICD-10-CM | POA: Diagnosis present

## 2019-02-10 DIAGNOSIS — R531 Weakness: Secondary | ICD-10-CM | POA: Diagnosis not present

## 2019-02-10 DIAGNOSIS — Z9119 Patient's noncompliance with other medical treatment and regimen: Secondary | ICD-10-CM

## 2019-02-10 DIAGNOSIS — I509 Heart failure, unspecified: Secondary | ICD-10-CM | POA: Diagnosis present

## 2019-02-10 DIAGNOSIS — Z992 Dependence on renal dialysis: Secondary | ICD-10-CM | POA: Insufficient documentation

## 2019-02-10 DIAGNOSIS — E1142 Type 2 diabetes mellitus with diabetic polyneuropathy: Secondary | ICD-10-CM | POA: Diagnosis present

## 2019-02-10 DIAGNOSIS — Z79899 Other long term (current) drug therapy: Secondary | ICD-10-CM | POA: Insufficient documentation

## 2019-02-10 DIAGNOSIS — I959 Hypotension, unspecified: Secondary | ICD-10-CM | POA: Diagnosis not present

## 2019-02-10 DIAGNOSIS — S01512A Laceration without foreign body of oral cavity, initial encounter: Secondary | ICD-10-CM | POA: Insufficient documentation

## 2019-02-10 DIAGNOSIS — Z7982 Long term (current) use of aspirin: Secondary | ICD-10-CM | POA: Diagnosis not present

## 2019-02-10 DIAGNOSIS — G8194 Hemiplegia, unspecified affecting left nondominant side: Secondary | ICD-10-CM | POA: Diagnosis present

## 2019-02-10 DIAGNOSIS — W19XXXA Unspecified fall, initial encounter: Secondary | ICD-10-CM

## 2019-02-10 DIAGNOSIS — N189 Chronic kidney disease, unspecified: Secondary | ICD-10-CM | POA: Diagnosis not present

## 2019-02-10 DIAGNOSIS — E781 Pure hyperglyceridemia: Secondary | ICD-10-CM | POA: Diagnosis present

## 2019-02-10 DIAGNOSIS — N186 End stage renal disease: Secondary | ICD-10-CM | POA: Insufficient documentation

## 2019-02-10 DIAGNOSIS — Z03818 Encounter for observation for suspected exposure to other biological agents ruled out: Secondary | ICD-10-CM | POA: Diagnosis not present

## 2019-02-10 DIAGNOSIS — J90 Pleural effusion, not elsewhere classified: Secondary | ICD-10-CM | POA: Diagnosis not present

## 2019-02-10 DIAGNOSIS — Z794 Long term (current) use of insulin: Secondary | ICD-10-CM | POA: Diagnosis not present

## 2019-02-10 DIAGNOSIS — N179 Acute kidney failure, unspecified: Secondary | ICD-10-CM | POA: Diagnosis present

## 2019-02-10 DIAGNOSIS — K7030 Alcoholic cirrhosis of liver without ascites: Secondary | ICD-10-CM | POA: Diagnosis present

## 2019-02-10 DIAGNOSIS — R4182 Altered mental status, unspecified: Secondary | ICD-10-CM | POA: Diagnosis not present

## 2019-02-10 DIAGNOSIS — W01198A Fall on same level from slipping, tripping and stumbling with subsequent striking against other object, initial encounter: Secondary | ICD-10-CM | POA: Insufficient documentation

## 2019-02-10 DIAGNOSIS — Z7901 Long term (current) use of anticoagulants: Secondary | ICD-10-CM | POA: Diagnosis not present

## 2019-02-10 DIAGNOSIS — L03115 Cellulitis of right lower limb: Secondary | ICD-10-CM | POA: Diagnosis present

## 2019-02-10 DIAGNOSIS — F10229 Alcohol dependence with intoxication, unspecified: Secondary | ICD-10-CM | POA: Diagnosis present

## 2019-02-10 DIAGNOSIS — R29710 NIHSS score 10: Secondary | ICD-10-CM | POA: Diagnosis present

## 2019-02-10 DIAGNOSIS — J449 Chronic obstructive pulmonary disease, unspecified: Secondary | ICD-10-CM | POA: Diagnosis present

## 2019-02-10 DIAGNOSIS — E1122 Type 2 diabetes mellitus with diabetic chronic kidney disease: Secondary | ICD-10-CM | POA: Insufficient documentation

## 2019-02-10 DIAGNOSIS — I517 Cardiomegaly: Secondary | ICD-10-CM | POA: Diagnosis not present

## 2019-02-10 DIAGNOSIS — E039 Hypothyroidism, unspecified: Secondary | ICD-10-CM | POA: Diagnosis present

## 2019-02-10 DIAGNOSIS — G459 Transient cerebral ischemic attack, unspecified: Secondary | ICD-10-CM | POA: Diagnosis not present

## 2019-02-10 DIAGNOSIS — D61818 Other pancytopenia: Secondary | ICD-10-CM | POA: Diagnosis present

## 2019-02-10 DIAGNOSIS — E1022 Type 1 diabetes mellitus with diabetic chronic kidney disease: Secondary | ICD-10-CM | POA: Diagnosis not present

## 2019-02-10 DIAGNOSIS — I63511 Cerebral infarction due to unspecified occlusion or stenosis of right middle cerebral artery: Secondary | ICD-10-CM | POA: Diagnosis not present

## 2019-02-10 DIAGNOSIS — E7800 Pure hypercholesterolemia, unspecified: Secondary | ICD-10-CM | POA: Diagnosis present

## 2019-02-10 DIAGNOSIS — I639 Cerebral infarction, unspecified: Secondary | ICD-10-CM

## 2019-02-10 DIAGNOSIS — Z1159 Encounter for screening for other viral diseases: Secondary | ICD-10-CM

## 2019-02-10 DIAGNOSIS — Z86718 Personal history of other venous thrombosis and embolism: Secondary | ICD-10-CM

## 2019-02-10 DIAGNOSIS — A09 Infectious gastroenteritis and colitis, unspecified: Secondary | ICD-10-CM

## 2019-02-10 DIAGNOSIS — I6782 Cerebral ischemia: Secondary | ICD-10-CM | POA: Diagnosis not present

## 2019-02-10 DIAGNOSIS — E872 Acidosis: Secondary | ICD-10-CM | POA: Diagnosis present

## 2019-02-10 DIAGNOSIS — G92 Toxic encephalopathy: Secondary | ICD-10-CM | POA: Diagnosis not present

## 2019-02-10 DIAGNOSIS — Y999 Unspecified external cause status: Secondary | ICD-10-CM | POA: Insufficient documentation

## 2019-02-10 DIAGNOSIS — N2581 Secondary hyperparathyroidism of renal origin: Secondary | ICD-10-CM | POA: Diagnosis not present

## 2019-02-10 DIAGNOSIS — Z9115 Patient's noncompliance with renal dialysis: Secondary | ICD-10-CM

## 2019-02-10 DIAGNOSIS — L03116 Cellulitis of left lower limb: Secondary | ICD-10-CM | POA: Diagnosis present

## 2019-02-10 DIAGNOSIS — K219 Gastro-esophageal reflux disease without esophagitis: Secondary | ICD-10-CM | POA: Diagnosis present

## 2019-02-10 DIAGNOSIS — Y939 Activity, unspecified: Secondary | ICD-10-CM | POA: Insufficient documentation

## 2019-02-10 DIAGNOSIS — R58 Hemorrhage, not elsewhere classified: Secondary | ICD-10-CM | POA: Diagnosis not present

## 2019-02-10 DIAGNOSIS — F17220 Nicotine dependence, chewing tobacco, uncomplicated: Secondary | ICD-10-CM | POA: Diagnosis present

## 2019-02-10 DIAGNOSIS — I2510 Atherosclerotic heart disease of native coronary artery without angina pectoris: Secondary | ICD-10-CM | POA: Diagnosis present

## 2019-02-10 DIAGNOSIS — X58XXXA Exposure to other specified factors, initial encounter: Secondary | ICD-10-CM | POA: Diagnosis not present

## 2019-02-10 DIAGNOSIS — R0902 Hypoxemia: Secondary | ICD-10-CM | POA: Diagnosis not present

## 2019-02-10 DIAGNOSIS — R0989 Other specified symptoms and signs involving the circulatory and respiratory systems: Secondary | ICD-10-CM | POA: Diagnosis not present

## 2019-02-10 DIAGNOSIS — I120 Hypertensive chronic kidney disease with stage 5 chronic kidney disease or end stage renal disease: Secondary | ICD-10-CM | POA: Insufficient documentation

## 2019-02-10 DIAGNOSIS — E785 Hyperlipidemia, unspecified: Secondary | ICD-10-CM | POA: Diagnosis present

## 2019-02-10 DIAGNOSIS — R0689 Other abnormalities of breathing: Secondary | ICD-10-CM | POA: Diagnosis not present

## 2019-02-10 DIAGNOSIS — Y929 Unspecified place or not applicable: Secondary | ICD-10-CM | POA: Insufficient documentation

## 2019-02-10 DIAGNOSIS — R000 Tachycardia, unspecified: Secondary | ICD-10-CM | POA: Diagnosis not present

## 2019-02-10 DIAGNOSIS — Z7951 Long term (current) use of inhaled steroids: Secondary | ICD-10-CM

## 2019-02-10 DIAGNOSIS — R609 Edema, unspecified: Secondary | ICD-10-CM | POA: Diagnosis not present

## 2019-02-10 DIAGNOSIS — E871 Hypo-osmolality and hyponatremia: Secondary | ICD-10-CM | POA: Diagnosis present

## 2019-02-10 DIAGNOSIS — R197 Diarrhea, unspecified: Secondary | ICD-10-CM | POA: Diagnosis not present

## 2019-02-10 HISTORY — DX: Heart failure, unspecified: I50.9

## 2019-02-10 HISTORY — DX: Peripheral vascular disease, unspecified: I73.9

## 2019-02-10 HISTORY — DX: Atherosclerotic heart disease of native coronary artery without angina pectoris: I25.10

## 2019-02-10 HISTORY — DX: Unspecified asthma, uncomplicated: J45.909

## 2019-02-10 HISTORY — DX: Pneumonia, unspecified organism: J18.9

## 2019-02-10 HISTORY — DX: Hypothyroidism, unspecified: E03.9

## 2019-02-10 HISTORY — DX: Chronic obstructive pulmonary disease, unspecified: J44.9

## 2019-02-10 LAB — CBC WITH DIFFERENTIAL/PLATELET
Abs Immature Granulocytes: 0.02 10*3/uL (ref 0.00–0.07)
Basophils Absolute: 0 10*3/uL (ref 0.0–0.1)
Basophils Relative: 1 %
Eosinophils Absolute: 0 10*3/uL (ref 0.0–0.5)
Eosinophils Relative: 1 %
HCT: 26 % — ABNORMAL LOW (ref 39.0–52.0)
Hemoglobin: 8.2 g/dL — ABNORMAL LOW (ref 13.0–17.0)
Immature Granulocytes: 0 %
Lymphocytes Relative: 17 %
Lymphs Abs: 1 10*3/uL (ref 0.7–4.0)
MCH: 29.6 pg (ref 26.0–34.0)
MCHC: 31.5 g/dL (ref 30.0–36.0)
MCV: 93.9 fL (ref 80.0–100.0)
Monocytes Absolute: 0.4 10*3/uL (ref 0.1–1.0)
Monocytes Relative: 6 %
Neutro Abs: 4.3 10*3/uL (ref 1.7–7.7)
Neutrophils Relative %: 75 %
Platelets: 171 10*3/uL (ref 150–400)
RBC: 2.77 MIL/uL — ABNORMAL LOW (ref 4.22–5.81)
RDW: 14.9 % (ref 11.5–15.5)
WBC: 5.8 10*3/uL (ref 4.0–10.5)
nRBC: 0 % (ref 0.0–0.2)

## 2019-02-10 LAB — BASIC METABOLIC PANEL
Anion gap: 18 — ABNORMAL HIGH (ref 5–15)
BUN: 66 mg/dL — ABNORMAL HIGH (ref 6–20)
CO2: 18 mmol/L — ABNORMAL LOW (ref 22–32)
Calcium: 8.4 mg/dL — ABNORMAL LOW (ref 8.9–10.3)
Chloride: 91 mmol/L — ABNORMAL LOW (ref 98–111)
Creatinine, Ser: 7.67 mg/dL — ABNORMAL HIGH (ref 0.61–1.24)
GFR calc Af Amer: 8 mL/min — ABNORMAL LOW (ref >60)
GFR calc non Af Amer: 7 mL/min — ABNORMAL LOW (ref >60)
Glucose, Bld: 231 mg/dL — ABNORMAL HIGH (ref 70–99)
Potassium: 5.2 mmol/L — ABNORMAL HIGH (ref 3.5–5.1)
Sodium: 127 mmol/L — ABNORMAL LOW (ref 135–145)

## 2019-02-10 LAB — PROTIME-INR
INR: 1.8 — ABNORMAL HIGH (ref 0.8–1.2)
Prothrombin Time: 21 seconds — ABNORMAL HIGH (ref 11.4–15.2)

## 2019-02-10 MED ORDER — LIDOCAINE HCL (PF) 2 % IJ SOLN
5.0000 mL | Freq: Once | INTRAMUSCULAR | Status: AC
  Administered 2019-02-10: 5 mL via INTRADERMAL

## 2019-02-10 MED ORDER — POVIDONE-IODINE 10 % EX SOLN
CUTANEOUS | Status: AC
  Filled 2019-02-10: qty 15

## 2019-02-10 MED ORDER — LIDOCAINE HCL (PF) 2 % IJ SOLN
INTRAMUSCULAR | Status: AC
  Administered 2019-02-10: 13:00:00 5 mL via INTRADERMAL
  Filled 2019-02-10: qty 20

## 2019-02-10 MED ORDER — SODIUM ZIRCONIUM CYCLOSILICATE 5 G PO PACK
10.0000 g | PACK | Freq: Once | ORAL | Status: AC
  Administered 2019-02-10: 10 g via ORAL
  Filled 2019-02-10: qty 2

## 2019-02-10 NOTE — Discharge Instructions (Addendum)
Soft foods and liquids.  The sutures inside your mouth will dissolve.  It's important that you get your dialysis treatment done tomorrow.  Return to ER for any worsening symptoms

## 2019-02-10 NOTE — ED Triage Notes (Addendum)
Pt from home alone.  Brought in from dialysis and fell. Denies LOC.  [place] and hit his mouth on a bench. Bruising to the left side of mouth.  Bleeding controlled.  Pt on coumadin
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
## 2019-02-10 NOTE — ED Notes (Signed)
Pt's ride was called.  Pt waiting for ride to arrive
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
## 2019-02-10 NOTE — ED Notes (Signed)
Wound care provided to left forearm

## 2019-02-10 NOTE — ED Notes (Signed)
Patient denies pain and is resting comfortably.  
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
## 2019-02-11 ENCOUNTER — Encounter (HOSPITAL_COMMUNITY): Payer: Self-pay

## 2019-02-11 ENCOUNTER — Emergency Department (HOSPITAL_COMMUNITY): Payer: Medicare Other

## 2019-02-11 DIAGNOSIS — L0390 Cellulitis, unspecified: Secondary | ICD-10-CM | POA: Diagnosis not present

## 2019-02-11 DIAGNOSIS — N2581 Secondary hyperparathyroidism of renal origin: Secondary | ICD-10-CM | POA: Diagnosis not present

## 2019-02-11 DIAGNOSIS — R4182 Altered mental status, unspecified: Secondary | ICD-10-CM | POA: Diagnosis not present

## 2019-02-11 DIAGNOSIS — Z79899 Other long term (current) drug therapy: Secondary | ICD-10-CM | POA: Diagnosis not present

## 2019-02-11 DIAGNOSIS — I63511 Cerebral infarction due to unspecified occlusion or stenosis of right middle cerebral artery: Secondary | ICD-10-CM | POA: Diagnosis present

## 2019-02-11 DIAGNOSIS — Z9119 Patient's noncompliance with other medical treatment and regimen: Secondary | ICD-10-CM | POA: Diagnosis not present

## 2019-02-11 DIAGNOSIS — E1122 Type 2 diabetes mellitus with diabetic chronic kidney disease: Secondary | ICD-10-CM | POA: Diagnosis present

## 2019-02-11 DIAGNOSIS — E871 Hypo-osmolality and hyponatremia: Secondary | ICD-10-CM | POA: Diagnosis present

## 2019-02-11 DIAGNOSIS — I639 Cerebral infarction, unspecified: Secondary | ICD-10-CM | POA: Diagnosis not present

## 2019-02-11 DIAGNOSIS — R29898 Other symptoms and signs involving the musculoskeletal system: Secondary | ICD-10-CM | POA: Diagnosis not present

## 2019-02-11 DIAGNOSIS — Z7901 Long term (current) use of anticoagulants: Secondary | ICD-10-CM | POA: Diagnosis not present

## 2019-02-11 DIAGNOSIS — D61818 Other pancytopenia: Secondary | ICD-10-CM | POA: Diagnosis present

## 2019-02-11 DIAGNOSIS — R531 Weakness: Secondary | ICD-10-CM | POA: Diagnosis not present

## 2019-02-11 DIAGNOSIS — R0989 Other specified symptoms and signs involving the circulatory and respiratory systems: Secondary | ICD-10-CM | POA: Diagnosis not present

## 2019-02-11 DIAGNOSIS — I6389 Other cerebral infarction: Secondary | ICD-10-CM | POA: Diagnosis not present

## 2019-02-11 DIAGNOSIS — L03115 Cellulitis of right lower limb: Secondary | ICD-10-CM

## 2019-02-11 DIAGNOSIS — Z7401 Bed confinement status: Secondary | ICD-10-CM | POA: Diagnosis not present

## 2019-02-11 DIAGNOSIS — Z794 Long term (current) use of insulin: Secondary | ICD-10-CM | POA: Diagnosis not present

## 2019-02-11 DIAGNOSIS — A09 Infectious gastroenteritis and colitis, unspecified: Secondary | ICD-10-CM | POA: Diagnosis not present

## 2019-02-11 DIAGNOSIS — Z7982 Long term (current) use of aspirin: Secondary | ICD-10-CM | POA: Diagnosis not present

## 2019-02-11 DIAGNOSIS — L03116 Cellulitis of left lower limb: Secondary | ICD-10-CM | POA: Diagnosis present

## 2019-02-11 DIAGNOSIS — R2689 Other abnormalities of gait and mobility: Secondary | ICD-10-CM | POA: Diagnosis not present

## 2019-02-11 DIAGNOSIS — Z86718 Personal history of other venous thrombosis and embolism: Secondary | ICD-10-CM | POA: Diagnosis not present

## 2019-02-11 DIAGNOSIS — J90 Pleural effusion, not elsewhere classified: Secondary | ICD-10-CM | POA: Diagnosis not present

## 2019-02-11 DIAGNOSIS — I517 Cardiomegaly: Secondary | ICD-10-CM | POA: Diagnosis not present

## 2019-02-11 DIAGNOSIS — Z9115 Patient's noncompliance with renal dialysis: Secondary | ICD-10-CM | POA: Diagnosis not present

## 2019-02-11 DIAGNOSIS — G8194 Hemiplegia, unspecified affecting left nondominant side: Secondary | ICD-10-CM | POA: Diagnosis present

## 2019-02-11 DIAGNOSIS — E872 Acidosis: Secondary | ICD-10-CM | POA: Diagnosis present

## 2019-02-11 DIAGNOSIS — E1142 Type 2 diabetes mellitus with diabetic polyneuropathy: Secondary | ICD-10-CM | POA: Diagnosis present

## 2019-02-11 DIAGNOSIS — G92 Toxic encephalopathy: Secondary | ICD-10-CM | POA: Diagnosis present

## 2019-02-11 DIAGNOSIS — N179 Acute kidney failure, unspecified: Secondary | ICD-10-CM | POA: Diagnosis present

## 2019-02-11 DIAGNOSIS — R29810 Facial weakness: Secondary | ICD-10-CM | POA: Diagnosis present

## 2019-02-11 DIAGNOSIS — Z992 Dependence on renal dialysis: Secondary | ICD-10-CM | POA: Diagnosis not present

## 2019-02-11 DIAGNOSIS — N186 End stage renal disease: Secondary | ICD-10-CM | POA: Diagnosis not present

## 2019-02-11 DIAGNOSIS — I120 Hypertensive chronic kidney disease with stage 5 chronic kidney disease or end stage renal disease: Secondary | ICD-10-CM | POA: Diagnosis not present

## 2019-02-11 DIAGNOSIS — I132 Hypertensive heart and chronic kidney disease with heart failure and with stage 5 chronic kidney disease, or end stage renal disease: Secondary | ICD-10-CM | POA: Diagnosis present

## 2019-02-11 DIAGNOSIS — Z1159 Encounter for screening for other viral diseases: Secondary | ICD-10-CM | POA: Diagnosis not present

## 2019-02-11 DIAGNOSIS — D631 Anemia in chronic kidney disease: Secondary | ICD-10-CM | POA: Diagnosis not present

## 2019-02-11 LAB — GLUCOSE, CAPILLARY
Glucose-Capillary: 253 mg/dL — ABNORMAL HIGH (ref 70–99)
Glucose-Capillary: 206 mg/dL — ABNORMAL HIGH (ref 70–99)
Glucose-Capillary: 160 mg/dL — ABNORMAL HIGH (ref 70–99)
Glucose-Capillary: 145 mg/dL — ABNORMAL HIGH (ref 70–99)
Glucose-Capillary: 69 mg/dL — ABNORMAL LOW (ref 70–99)

## 2019-02-11 LAB — URINALYSIS, ROUTINE W REFLEX MICROSCOPIC
Bilirubin Urine: NEGATIVE
Glucose, UA: 250 mg/dL — AB
Nitrite: POSITIVE — AB
Protein, ur: >300 mg/dL — AB
Specific Gravity, Urine: 1.01 (ref 1.005–1.030)
pH: 7 (ref 5.0–8.0)

## 2019-02-11 LAB — TROPONIN I: Troponin I: 0.08 ng/mL (ref <0.03)

## 2019-02-11 LAB — COMPREHENSIVE METABOLIC PANEL
ALT: 30 U/L (ref 0–44)
ALT: 34 U/L (ref 0–44)
AST: 43 U/L — ABNORMAL HIGH (ref 15–41)
AST: 48 U/L — ABNORMAL HIGH (ref 15–41)
Albumin: 2.7 g/dL — ABNORMAL LOW (ref 3.5–5.0)
Albumin: 2.8 g/dL — ABNORMAL LOW (ref 3.5–5.0)
Alkaline Phosphatase: 224 U/L — ABNORMAL HIGH (ref 38–126)
Alkaline Phosphatase: 232 U/L — ABNORMAL HIGH (ref 38–126)
Anion gap: 19 — ABNORMAL HIGH (ref 5–15)
Anion gap: 19 — ABNORMAL HIGH (ref 5–15)
BUN: 73 mg/dL — ABNORMAL HIGH (ref 6–20)
BUN: 75 mg/dL — ABNORMAL HIGH (ref 6–20)
CO2: 16 mmol/L — ABNORMAL LOW (ref 22–32)
CO2: 17 mmol/L — ABNORMAL LOW (ref 22–32)
Calcium: 8.4 mg/dL — ABNORMAL LOW (ref 8.9–10.3)
Calcium: 8.4 mg/dL — ABNORMAL LOW (ref 8.9–10.3)
Chloride: 89 mmol/L — ABNORMAL LOW (ref 98–111)
Chloride: 91 mmol/L — ABNORMAL LOW (ref 98–111)
Creatinine, Ser: 7.71 mg/dL — ABNORMAL HIGH (ref 0.61–1.24)
Creatinine, Ser: 7.86 mg/dL — ABNORMAL HIGH (ref 0.61–1.24)
GFR calc Af Amer: 8 mL/min — ABNORMAL LOW (ref >60)
GFR calc Af Amer: 8 mL/min — ABNORMAL LOW (ref >60)
GFR calc non Af Amer: 7 mL/min — ABNORMAL LOW (ref >60)
GFR calc non Af Amer: 7 mL/min — ABNORMAL LOW (ref >60)
Glucose, Bld: 275 mg/dL — ABNORMAL HIGH (ref 70–99)
Glucose, Bld: 305 mg/dL — ABNORMAL HIGH (ref 70–99)
Potassium: 4.4 mmol/L (ref 3.5–5.1)
Potassium: 4.6 mmol/L (ref 3.5–5.1)
Sodium: 125 mmol/L — ABNORMAL LOW (ref 135–145)
Sodium: 126 mmol/L — ABNORMAL LOW (ref 135–145)
Total Bilirubin: 0.7 mg/dL (ref 0.3–1.2)
Total Bilirubin: 0.7 mg/dL (ref 0.3–1.2)
Total Protein: 6.5 g/dL (ref 6.5–8.1)
Total Protein: 6.5 g/dL (ref 6.5–8.1)

## 2019-02-11 LAB — CBC WITH DIFFERENTIAL/PLATELET
Abs Immature Granulocytes: 0.01 10*3/uL (ref 0.00–0.07)
Basophils Absolute: 0 10*3/uL (ref 0.0–0.1)
Basophils Relative: 1 %
Eosinophils Absolute: 0 10*3/uL (ref 0.0–0.5)
Eosinophils Relative: 1 %
HCT: 24.7 % — ABNORMAL LOW (ref 39.0–52.0)
Hemoglobin: 8 g/dL — ABNORMAL LOW (ref 13.0–17.0)
Immature Granulocytes: 0 %
Lymphocytes Relative: 23 %
Lymphs Abs: 0.9 10*3/uL (ref 0.7–4.0)
MCH: 30 pg (ref 26.0–34.0)
MCHC: 32.4 g/dL (ref 30.0–36.0)
MCV: 92.5 fL (ref 80.0–100.0)
Monocytes Absolute: 0.2 10*3/uL (ref 0.1–1.0)
Monocytes Relative: 6 %
Neutro Abs: 2.7 10*3/uL (ref 1.7–7.7)
Neutrophils Relative %: 69 %
Platelets: 169 10*3/uL (ref 150–400)
RBC: 2.67 MIL/uL — ABNORMAL LOW (ref 4.22–5.81)
RDW: 15.2 % (ref 11.5–15.5)
WBC: 3.9 10*3/uL — ABNORMAL LOW (ref 4.0–10.5)
nRBC: 0 % (ref 0.0–0.2)

## 2019-02-11 LAB — CBC
HCT: 25.3 % — ABNORMAL LOW (ref 39.0–52.0)
Hemoglobin: 8.1 g/dL — ABNORMAL LOW (ref 13.0–17.0)
MCH: 30.2 pg (ref 26.0–34.0)
MCHC: 32 g/dL (ref 30.0–36.0)
MCV: 94.4 fL (ref 80.0–100.0)
Platelets: 148 10*3/uL — ABNORMAL LOW (ref 150–400)
RBC: 2.68 MIL/uL — ABNORMAL LOW (ref 4.22–5.81)
RDW: 15.3 % (ref 11.5–15.5)
WBC: 4.8 10*3/uL (ref 4.0–10.5)
nRBC: 0 % (ref 0.0–0.2)

## 2019-02-11 LAB — HEMOGLOBIN A1C
Hgb A1c MFr Bld: 8.2 % — ABNORMAL HIGH (ref 4.8–5.6)
Mean Plasma Glucose: 188.64 mg/dL

## 2019-02-11 LAB — ETHANOL: Alcohol, Ethyl (B): <10 mg/dL (ref <10)

## 2019-02-11 LAB — PROTIME-INR
INR: 2.4 — ABNORMAL HIGH (ref 0.8–1.2)
Prothrombin Time: 26.2 seconds — ABNORMAL HIGH (ref 11.4–15.2)

## 2019-02-11 LAB — SARS CORONAVIRUS 2 BY RT PCR (HOSPITAL ORDER, PERFORMED IN ~~LOC~~ HOSPITAL LAB): SARS Coronavirus 2: NEGATIVE

## 2019-02-11 LAB — URINALYSIS, MICROSCOPIC (REFLEX): RBC / HPF: >50 RBC/hpf (ref 0–5)

## 2019-02-11 LAB — BRAIN NATRIURETIC PEPTIDE: B Natriuretic Peptide: 471 pg/mL — ABNORMAL HIGH (ref 0.0–100.0)

## 2019-02-11 LAB — LACTIC ACID, PLASMA: Lactic Acid, Venous: 3.5 mmol/L (ref 0.5–1.9)

## 2019-02-11 MED ORDER — ONDANSETRON HCL 4 MG/2ML IJ SOLN: 4.0000 mg | Freq: Four times a day (QID) | INTRAMUSCULAR | Status: DC | PRN

## 2019-02-11 MED ORDER — LORAZEPAM 2 MG/ML IJ SOLN: 1.0000 mg | Freq: Four times a day (QID) | INTRAMUSCULAR | Status: AC | PRN

## 2019-02-11 MED ORDER — ACETAMINOPHEN 325 MG PO TABS: 650.0000 mg | ORAL_TABLET | Freq: Four times a day (QID) | ORAL | Status: DC | PRN

## 2019-02-11 MED ORDER — ACETAMINOPHEN 650 MG RE SUPP: 650.0000 mg | Freq: Four times a day (QID) | RECTAL | Status: DC | PRN

## 2019-02-11 MED ORDER — SODIUM BICARBONATE 650 MG PO TABS
650.0000 mg | ORAL_TABLET | Freq: Two times a day (BID) | ORAL | Status: DC
  Administered 2019-02-11 – 2019-02-13 (×5): 650 mg via ORAL
  Filled 2019-02-11 (×5): qty 1

## 2019-02-11 MED ORDER — INSULIN ASPART 100 UNIT/ML ~~LOC~~ SOLN
0.0000 [IU] | Freq: Three times a day (TID) | SUBCUTANEOUS | Status: DC
  Administered 2019-02-11: 3 [IU] via SUBCUTANEOUS
  Administered 2019-02-11 – 2019-02-13 (×2): 1 [IU] via SUBCUTANEOUS

## 2019-02-11 MED ORDER — LORAZEPAM 2 MG/ML IJ SOLN: 0.0000 mg | Freq: Four times a day (QID) | INTRAMUSCULAR | Status: AC

## 2019-02-11 MED ORDER — WARFARIN - PHARMACIST DOSING INPATIENT
Freq: Every day | Status: DC
  Administered 2019-02-12: 17:00:00

## 2019-02-11 MED ORDER — INSULIN DETEMIR 100 UNIT/ML ~~LOC~~ SOLN
18.0000 [IU] | Freq: Every day | SUBCUTANEOUS | Status: DC
  Administered 2019-02-11: 18 [IU] via SUBCUTANEOUS
  Filled 2019-02-11 (×2): qty 0.18

## 2019-02-11 MED ORDER — FOLIC ACID 1 MG PO TABS
1.0000 mg | ORAL_TABLET | Freq: Every day | ORAL | Status: DC
  Administered 2019-02-11 – 2019-02-13 (×2): 1 mg via ORAL
  Filled 2019-02-11: qty 1

## 2019-02-11 MED ORDER — PIPERACILLIN-TAZOBACTAM 3.375 G IVPB 30 MIN
3.3750 g | Freq: Once | INTRAVENOUS | Status: AC
  Administered 2019-02-11: 3.375 g via INTRAVENOUS
  Filled 2019-02-11: qty 50

## 2019-02-11 MED ORDER — GABAPENTIN 300 MG PO CAPS: 300.0000 mg | ORAL_CAPSULE | Freq: Every day | ORAL | Status: DC

## 2019-02-11 MED ORDER — PANTOPRAZOLE SODIUM 20 MG PO TBEC
20.0000 mg | DELAYED_RELEASE_TABLET | Freq: Every day | ORAL | Status: DC
  Filled 2019-02-11 (×3): qty 1

## 2019-02-11 MED ORDER — HYDRALAZINE HCL 20 MG/ML IJ SOLN: 10.0000 mg | INTRAMUSCULAR | Status: DC | PRN

## 2019-02-11 MED ORDER — ASPIRIN EC 81 MG PO TBEC
81.0000 mg | DELAYED_RELEASE_TABLET | Freq: Every day | ORAL | Status: DC
  Administered 2019-02-11 – 2019-02-13 (×2): 81 mg via ORAL
  Filled 2019-02-11 (×2): qty 1

## 2019-02-11 MED ORDER — ALBUTEROL SULFATE (2.5 MG/3ML) 0.083% IN NEBU: 3.0000 mL | INHALATION_SOLUTION | RESPIRATORY_TRACT | Status: DC | PRN

## 2019-02-11 MED ORDER — HEPARIN SODIUM (PORCINE) 1000 UNIT/ML DIALYSIS
6000.0000 [IU] | INTRAMUSCULAR | Status: DC | PRN
  Filled 2019-02-11 (×2): qty 6

## 2019-02-11 MED ORDER — VITAMIN B-1 100 MG PO TABS
100.0000 mg | ORAL_TABLET | Freq: Every day | ORAL | Status: DC
  Administered 2019-02-11 – 2019-02-13 (×2): 100 mg via ORAL
  Filled 2019-02-11 (×2): qty 1

## 2019-02-11 MED ORDER — MOMETASONE FURO-FORMOTEROL FUM 100-5 MCG/ACT IN AERO
2.0000 | INHALATION_SPRAY | Freq: Every morning | RESPIRATORY_TRACT | Status: DC
  Administered 2019-02-12 – 2019-02-14 (×3): 2 via RESPIRATORY_TRACT
  Filled 2019-02-11: qty 8.8

## 2019-02-11 MED ORDER — HEPARIN SODIUM (PORCINE) 1000 UNIT/ML DIALYSIS
20.0000 [IU]/kg | INTRAMUSCULAR | Status: DC | PRN
  Filled 2019-02-11: qty 3

## 2019-02-11 MED ORDER — SODIUM CHLORIDE 0.9 % IV SOLN: 100.0000 mL | INTRAVENOUS | Status: DC | PRN

## 2019-02-11 MED ORDER — SODIUM CHLORIDE 0.9 % IV SOLN
2.0000 g | INTRAVENOUS | Status: DC
  Administered 2019-02-11 – 2019-02-13 (×3): 2 g via INTRAVENOUS
  Filled 2019-02-11 (×3): qty 20

## 2019-02-11 MED ORDER — ALTEPLASE 2 MG IJ SOLR: 2.0000 mg | Freq: Once | INTRAMUSCULAR | Status: DC | PRN

## 2019-02-11 MED ORDER — ADULT MULTIVITAMIN W/MINERALS CH
1.0000 | ORAL_TABLET | Freq: Every day | ORAL | Status: DC
  Administered 2019-02-11 – 2019-02-13 (×2): 1 via ORAL
  Filled 2019-02-11 (×2): qty 1

## 2019-02-11 MED ORDER — ONDANSETRON HCL 4 MG PO TABS: 4.0000 mg | ORAL_TABLET | Freq: Four times a day (QID) | ORAL | Status: DC | PRN

## 2019-02-11 MED ORDER — THIAMINE HCL 100 MG/ML IJ SOLN
100.0000 mg | Freq: Every day | INTRAMUSCULAR | Status: DC
  Administered 2019-02-12: 100 mg via INTRAVENOUS
  Filled 2019-02-11: qty 2

## 2019-02-11 MED ORDER — PANTOPRAZOLE SODIUM 40 MG PO TBEC
40.0000 mg | DELAYED_RELEASE_TABLET | Freq: Every day | ORAL | Status: DC
  Administered 2019-02-11 – 2019-02-13 (×2): 40 mg via ORAL
  Filled 2019-02-11 (×2): qty 1

## 2019-02-11 MED ORDER — CALCIUM ACETATE (PHOS BINDER) 667 MG PO CAPS
1334.0000 mg | ORAL_CAPSULE | Freq: Three times a day (TID) | ORAL | Status: DC
  Administered 2019-02-11 – 2019-02-14 (×6): 1334 mg via ORAL
  Filled 2019-02-11 (×6): qty 2

## 2019-02-11 MED ORDER — METOPROLOL TARTRATE 25 MG PO TABS
12.5000 mg | ORAL_TABLET | Freq: Two times a day (BID) | ORAL | Status: DC
  Administered 2019-02-12 – 2019-02-13 (×3): 12.5 mg via ORAL
  Filled 2019-02-11 (×4): qty 1

## 2019-02-11 MED ORDER — SODIUM CHLORIDE 0.9 % IV SOLN: INTRAVENOUS | Status: DC

## 2019-02-11 MED ORDER — HEPARIN SODIUM (PORCINE) 1000 UNIT/ML DIALYSIS: 1000.0000 [IU] | INTRAMUSCULAR | Status: DC | PRN

## 2019-02-11 MED ORDER — SPIRONOLACTONE 25 MG PO TABS
25.0000 mg | ORAL_TABLET | Freq: Every day | ORAL | Status: DC
  Administered 2019-02-11 – 2019-02-13 (×2): 25 mg via ORAL
  Filled 2019-02-11 (×2): qty 1

## 2019-02-11 MED ORDER — LORAZEPAM 2 MG/ML IJ SOLN: 0.0000 mg | Freq: Two times a day (BID) | INTRAMUSCULAR | Status: DC

## 2019-02-11 MED ORDER — CHLORHEXIDINE GLUCONATE CLOTH 2 % EX PADS
6.0000 | MEDICATED_PAD | Freq: Every day | CUTANEOUS | Status: DC
  Administered 2019-02-11 – 2019-02-14 (×4): 6 via TOPICAL

## 2019-02-11 MED ORDER — LORAZEPAM 1 MG PO TABS: 1.0000 mg | ORAL_TABLET | Freq: Four times a day (QID) | ORAL | Status: AC | PRN

## 2019-02-11 MED ORDER — WARFARIN SODIUM 5 MG PO TABS
5.0000 mg | ORAL_TABLET | Freq: Every day | ORAL | Status: DC
  Administered 2019-02-11 – 2019-02-12 (×2): 5 mg via ORAL
  Filled 2019-02-11 (×2): qty 1

## 2019-02-11 NOTE — Progress Notes (Signed)
ANTICOAGULATION CONSULT NOTE - -Consult  Pharmacy Consult for warfarin Indication: VTE prophylaxis  Allergies  Allergen Reactions  . Codeine Nausea And Vomiting    Patient Measurements: Height: 5\' 11"  (180.3 cm) Weight: 258 lb 2.5 oz (117.1 kg) IBW/kg (Calculated) : 75.3  Vital Signs: Temp: 97.8 F (36.6 C) (05/06 0633) Temp Source: Oral (05/06 0633) BP: 143/128 (05/06 4081) Pulse Rate: 63 (05/06 0633)  Labs: Recent Labs    02/10/19 1143 02/10/19 2357 02/11/19 0315  HGB 8.2* 8.0* 8.1*  HCT 26.0* 24.7* 25.3*  PLT 171 169 148*  LABPROT 21.0*  --  26.2*  INR 1.8*  --  2.4*  CREATININE 7.67* 7.71* 7.86*  TROPONINI  --  0.08*  --     Estimated Creatinine Clearance: 13.5 mL/min (A) (by C-G formula based on SCr of 7.86 mg/dL (H)).   Medications:  Medications Prior to Admission  Medication Sig Dispense Refill Last Dose  . amitriptyline (ELAVIL) 25 MG tablet Take 25 mg by mouth at bedtime.   02/09/2019 at Unknown time  . aspirin EC 81 MG tablet Take 1 tablet (81 mg total) by mouth daily. (Patient not taking: Reported on 02/10/2019) 150 tablet 2 Not Taking at Unknown time  . calcium acetate (PHOSLO) 667 MG capsule Take 2 capsules (1,334 mg total) by mouth 3 (three) times daily with meals. 180 capsule 1 02/09/2019 at Unknown time  . furosemide (LASIX) 80 MG tablet Take 80 mg by mouth daily.   02/09/2019 at Unknown time  . gabapentin (NEURONTIN) 300 MG capsule Take 1 capsule (300 mg total) by mouth daily.   02/09/2019 at Unknown time  . glipiZIDE (GLUCOTROL) 5 MG tablet Take 5 mg by mouth daily before breakfast.   3 02/09/2019 at Unknown time  . insulin aspart (NOVOLOG) 100 UNIT/ML injection Inject 6 Units into the skin 3 (three) times daily with meals. 10 mL 11   . insulin detemir (LEVEMIR) 100 UNIT/ML injection Inject 0.18 mLs (18 Units total) into the skin daily. 10 mL 11   . metoprolol tartrate (LOPRESSOR) 25 MG tablet Take 0.5 tablets (12.5 mg total) by mouth 2 (two) times daily. 60  tablet 1 02/09/2019 at 1800  . midodrine (PROAMATINE) 10 MG tablet Take 1 tablet (10 mg total) by mouth 2 (two) times daily with a meal. (Patient taking differently: Take 10 mg by mouth daily as needed (for low bp from dialysis). ) 60 tablet 1 unknown  . mirtazapine (REMERON) 15 MG tablet Take 15 mg by mouth at bedtime.   6 02/09/2019 at Unknown time  . mometasone-formoterol (DULERA) 100-5 MCG/ACT AERO Inhale 2 puffs into the lungs every morning. 13 g 2 02/09/2019 at Unknown time  . omeprazole (PRILOSEC OTC) 20 MG tablet Take 1 tablet (20 mg total) by mouth daily. 30 tablet 1 02/09/2019 at Unknown time  . oxyCODONE-acetaminophen (PERCOCET) 5-325 MG tablet Take 1-2 tablets by mouth every 6 (six) hours as needed for moderate pain or severe pain. 45 tablet 0 unknown  . PROAIR HFA 108 (90 Base) MCG/ACT inhaler Inhale 2 puffs into the lungs every 4 (four) hours as needed for wheezing or shortness of breath.   0 unknown  . senna-docusate (SENOKOT-S) 8.6-50 MG tablet Take 1 tablet by mouth at bedtime as needed for mild constipation. (Patient not taking: Reported on 02/10/2019)   Not Taking at Unknown time  . sodium bicarbonate 650 MG tablet Take 1 tablet (650 mg total) by mouth 2 (two) times daily. (Patient not taking: Reported on 02/10/2019)  60 tablet 1 Not Taking at Unknown time  . spironolactone (ALDACTONE) 25 MG tablet Take 25 mg by mouth daily.   02/09/2019 at Unknown time  . traMADol (ULTRAM) 50 MG tablet Take 1 tablet (50 mg total) by mouth every 6 (six) hours as needed for moderate pain or severe pain. 15 tablet 0 unknown  . warfarin (COUMADIN) 5 MG tablet Take 5 mg by mouth daily at 6 PM.   02/09/2019 at 1800   Scheduled:  . aspirin EC  81 mg Oral Daily  . calcium acetate  1,334 mg Oral TID WC  . gabapentin  300 mg Oral Daily  . insulin aspart  0-9 Units Subcutaneous TID WC  . insulin detemir  18 Units Subcutaneous Daily  . metoprolol tartrate  12.5 mg Oral BID  . mometasone-formoterol  2 puff Inhalation q  morning - 10a  . pantoprazole  20 mg Oral Daily  . sodium bicarbonate  650 mg Oral BID  . spironolactone  25 mg Oral Daily  . Warfarin - Pharmacist Dosing Inpatient   Does not apply q1800   Infusions:  . sodium chloride    . 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 Admit INR 1.8, most recent is 2.4.   Goal of Therapy:  INR 2-3 Monitor platelets by anticoagulation protocol: Yes   Plan:  Warfarin 5mg  po daily  Monitor INR daily as well as CBC frequently.  Chlora Mcbain 02/11/2019,7:27 AM

## 2019-02-11 NOTE — Care Management Important Message (Signed)
Important Message  Patient Details  Name: Steven [name] MRN: [id number] Date of Birth: [birth year]   Medicare Important Message Given:  Yes    [name] 02/11/2019, 2:57 PM

## 2019-02-11 NOTE — Progress Notes (Signed)
Pts blood pressure was 98/75. Paged mid level asked if its ok to hold 2200 dose. Mid level said ok to hold 2200 dose for tonight. Will continue to monitor pt throughout shift.

## 2019-02-11 NOTE — ED Provider Notes (Signed)
Patient became more weak in the emergency department and could not stand he had persistent diarrhea.  Patient will be admitted to medicine and have dialysis tomorrow   Milton Ferguson, MD 02/11/19 [phone]
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
## 2019-02-11 NOTE — ED Notes (Signed)
CRITICAL VALUE ALERT  Critical Value:  Lactic acid 3.5 and Trop 0.08  Date & Time Notied:  02/11/2019 0104  Provider Notified: Dr. Roderic Palau  Orders Received/Actions taken: See chart
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
## 2019-02-11 NOTE — Plan of Care (Signed)
  Problem: Acute Rehab PT Goals(only PT should resolve) Goal: Pt Will Go Supine/Side To Sit Outcome: Progressing Flowsheets (Taken 02/11/2019 1251) Pt will go Supine/Side to Sit: with supervision Goal: Patient Will Transfer Sit To/From Stand Outcome: Progressing Flowsheets (Taken 02/11/2019 1251) Patient will transfer sit to/from stand: with min guard assist Goal: Pt Will Transfer Bed To Chair/Chair To Bed Outcome: Progressing Flowsheets (Taken 02/11/2019 1251) Pt will Transfer Bed to Chair/Chair to Bed: min guard assist Goal: Pt Will Ambulate Outcome: Progressing Flowsheets (Taken 02/11/2019 1251) Pt will Ambulate: 50 feet; with rolling walker; with minimal assist   12:52 PM, 02/11/19 Lonell Grandchild, MPT Physical Therapist with [facility] 336 [phone] office [phone] mobile phone

## 2019-02-11 NOTE — Progress Notes (Signed)
Went to give patient night time medication. noticed patients speech was slurred. Left side is more weak than right. Patient need to use bsc patient unable to transfer  from bed to bedside commode with one assist. Called for assistance. Pt was ready to come off bedside commode. Called for 2 assistance. Paged MD to make aware of patients symptoms. MD placed order for ct of head with out contrast. Will continue to monitor throughout shift.

## 2019-02-11 NOTE — Procedures (Signed)
    HEMODIALYSIS TREATMENT NOTE:  Pt lethargic but arousable throughout HD session. 4 hour low-heparin tx completed via right femoral tunneled catheter.  Exit site with mild erythema; no swelling, tenderness, or drainage.  Antibiotic ointment and biopatch applied. Afebrile. Goal met: 1 liter removed without interruption in ultrafiltration.  All blood was returned.  Rockwell [place], RN
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
## 2019-02-11 NOTE — ED Notes (Signed)
Pt taken to bathroom via wheelchair. Pt used walker to ambulate from door of restroom to the toilet. Pt needed help off the toilet and to wash hands. Called back into the restroom and pt was hanging onto the sink with feet sliding. Pt was assisted back to the wheelchair. Pt had stool over his entire bottom and legs. Pt was cleaned up and placed back into the bed. Dr. Roderic Palau notified.

## 2019-02-12 ENCOUNTER — Inpatient Hospital Stay (HOSPITAL_COMMUNITY): Payer: Medicare Other

## 2019-02-12 DIAGNOSIS — R531 Weakness: Secondary | ICD-10-CM

## 2019-02-12 DIAGNOSIS — R2689 Other abnormalities of gait and mobility: Secondary | ICD-10-CM | POA: Diagnosis not present

## 2019-02-12 DIAGNOSIS — R4182 Altered mental status, unspecified: Secondary | ICD-10-CM | POA: Diagnosis not present

## 2019-02-12 LAB — GASTROINTESTINAL PANEL BY PCR, STOOL (REPLACES STOOL CULTURE)

## 2019-02-12 LAB — BLOOD GAS, ARTERIAL
Acid-Base Excess: 5.1 mmol/L — ABNORMAL HIGH (ref 0.0–2.0)
Bicarbonate: 29.2 mmol/L — ABNORMAL HIGH (ref 20.0–28.0)
FIO2: 28
O2 Saturation: 98.7 %
Patient temperature: 37
pCO2 arterial: 34.8 mmHg (ref 32.0–48.0)
pH, Arterial: 7.519 — ABNORMAL HIGH (ref 7.350–7.450)
pO2, Arterial: 108 mmHg (ref 83.0–108.0)

## 2019-02-12 LAB — AMMONIA: Ammonia: 23 umol/L (ref 9–35)

## 2019-02-12 LAB — CBC
HCT: 24.9 % — ABNORMAL LOW (ref 39.0–52.0)
Hemoglobin: 8.1 g/dL — ABNORMAL LOW (ref 13.0–17.0)
MCH: 30.2 pg (ref 26.0–34.0)
MCHC: 32.5 g/dL (ref 30.0–36.0)
MCV: 92.9 fL (ref 80.0–100.0)
Platelets: 135 10*3/uL — ABNORMAL LOW (ref 150–400)
RBC: 2.68 MIL/uL — ABNORMAL LOW (ref 4.22–5.81)
RDW: 15.6 % — ABNORMAL HIGH (ref 11.5–15.5)
WBC: 3 10*3/uL — ABNORMAL LOW (ref 4.0–10.5)
nRBC: 0 % (ref 0.0–0.2)

## 2019-02-12 LAB — RENAL FUNCTION PANEL
Albumin: 2.5 g/dL — ABNORMAL LOW (ref 3.5–5.0)
Anion gap: 12 (ref 5–15)
BUN: 30 mg/dL — ABNORMAL HIGH (ref 6–20)
CO2: 27 mmol/L (ref 22–32)
Calcium: 8.2 mg/dL — ABNORMAL LOW (ref 8.9–10.3)
Chloride: 98 mmol/L (ref 98–111)
Creatinine, Ser: 4.57 mg/dL — ABNORMAL HIGH (ref 0.61–1.24)
GFR calc Af Amer: 15 mL/min — ABNORMAL LOW (ref >60)
GFR calc non Af Amer: 13 mL/min — ABNORMAL LOW (ref >60)
Glucose, Bld: 55 mg/dL — ABNORMAL LOW (ref 70–99)
Phosphorus: 3.1 mg/dL (ref 2.5–4.6)
Potassium: 3.4 mmol/L — ABNORMAL LOW (ref 3.5–5.1)
Sodium: 137 mmol/L (ref 135–145)

## 2019-02-12 LAB — PROTIME-INR
INR: 1.9 — ABNORMAL HIGH (ref 0.8–1.2)
Prothrombin Time: 21.8 seconds — ABNORMAL HIGH (ref 11.4–15.2)

## 2019-02-12 LAB — URINE CULTURE: Culture: NO GROWTH

## 2019-02-12 LAB — GLUCOSE, CAPILLARY
Glucose-Capillary: 118 mg/dL — ABNORMAL HIGH (ref 70–99)
Glucose-Capillary: 47 mg/dL — ABNORMAL LOW (ref 70–99)
Glucose-Capillary: 71 mg/dL (ref 70–99)
Glucose-Capillary: 71 mg/dL (ref 70–99)
Glucose-Capillary: 168 mg/dL — ABNORMAL HIGH (ref 70–99)

## 2019-02-12 LAB — HEPATITIS B SURFACE ANTIGEN: Hepatitis B Surface Ag: NEGATIVE

## 2019-02-12 LAB — TSH: TSH: 3.725 u[IU]/mL (ref 0.350–4.500)

## 2019-02-12 MED ORDER — DEXTROSE 50 % IV SOLN
1.0000 | Freq: Once | INTRAVENOUS | Status: AC
  Administered 2019-02-12: 50 mL via INTRAVENOUS

## 2019-02-12 MED ORDER — INSULIN DETEMIR 100 UNIT/ML ~~LOC~~ SOLN
9.0000 [IU] | Freq: Every day | SUBCUTANEOUS | Status: DC
  Administered 2019-02-13: 9 [IU] via SUBCUTANEOUS
  Filled 2019-02-12 (×5): qty 0.09

## 2019-02-12 MED ORDER — DEXTROSE 50 % IV SOLN
INTRAVENOUS | Status: AC
  Administered 2019-02-12: 50 mL via INTRAVENOUS
  Filled 2019-02-12: qty 50
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
## 2019-02-12 NOTE — Progress Notes (Signed)
Pt is starting to become more easy to arouse and less lethargic. Pt took in about 120 mL of water for RN and took evening dose of Coumadin crushed with applesauce. Pt states he wants to try and eat supper. Will continue to monitor.
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
## 2019-02-12 NOTE — Progress Notes (Signed)
Xcover Per RN bs=55 Did not improve with orange juice 1 amp D50 iv x1 May need to adjust levemir this morning

## 2019-02-12 NOTE — Progress Notes (Addendum)
Inpatient Diabetes Program Recommendations  AACE/ADA: New Consensus Statement on Inpatient Glycemic Control (2015)  Target Ranges:  Prepandial:   less than 140 mg/dL      Peak postprandial:   less than 180 mg/dL (1-2 hours)      Critically ill patients:  140 - 180 mg/dL   Lab Results  Component Value Date   GLUCAP 71 02/12/2019   HGBA1C 8.2 (H) 02/10/2019     Results for YOUSOF, Steven [name] (MRN [id number]) as of 02/12/2019 13:32  Ref. Range 02/11/2019 02:57 02/11/2019 07:25 02/11/2019 1043 02/11/2019 11:06 02/11/2019 16:10 02/11/2019 21:58 02/12/2019 06:45 02/12/2019 07:16 02/12/2019 11:12  Glucose-Capillary Latest Ref Range: 70 - 99 mg/dL 253 (H) 206 (H)  Novolog 3 units  Levemir 18 units 160 (H) 145 (H)  Novolog 1 unit 69 (L) 47 (L)  D 50 given  26ml 118 (H) 71     Review of Glycemic Control  Diabetes history: DM2  Outpatient Diabetes medications: Glucotrol 5mg  daily                                                        Levemir 18 units daily                                                         Novolog 6 units TID mc  Current orders for Inpatient glycemic control: Levemir 9 units daily  (decreased from 18 units given yesterday)                                                                         Novolog sensitive scale (0-9 units) tid  ESRD on HD    Patient had home dose of Levemir ordered and received at 1043 yesterday am. Blood sugar down to 47mg /dl at 0645 this am and treated with Amp of D50 per protocol and CBG up to 118mg /dl. MD decreased Levemir dose to 50% of home dose (9 units daily) for tomorrow. Per MAR/RN stated MD had her to hold it today. Called and spoke to [place] (beside Therapist, sports) as lunch time CBG was 71 and I suggested they go ahead and check another CBG this afternoon and not wait until dinner time to monitor that he doesn't drop low again. She stated he is eating very little.   -- Will follow during hospitalization.--  Jonna Clark RN, MSN Diabetes  Coordinator Inpatient Glycemic Control Team Team Pager: [phone] (8am-5pm)

## 2019-02-12 NOTE — Progress Notes (Signed)
Pt transported down to MRI via transport staff. Pt too lethargic to take any PO meds. MD made aware. Will continue to monitor.

## 2019-02-12 NOTE — Consult Note (Signed)
Steven A. Merlene Laughter, MD     [email or web address]          Steven [name] is an 58 y.o. male.   ASSESSMENT/PLAN: 1.  Acute encephalopathy due to multiple etiologies including lactic acidosis, acute on chronic renal failure having missed 2 dialysis today, and acute right MCA infarct.  The patient also likely has complications from acute and chronic alcohol intoxication.  Medication effect may also be adding to the problem.  Patient seems to have developed his right hemispheric infarcts on therapeutic INR/Coumadin.  This likely represents a failure of Coumadin.  He previously was on Eliquis Plavix combination.  It is unclear why this combination was discontinued.  I recommend that we switch to Xarelto and aspirin 81 mg.  The combination should be done for 2 weeks at most.  The aspirin can be discontinued after 2 weeks and the Xarelto continue with for stroke and DVT treatment.  Other treatment includes follow-up of lipid panel and echocardiography.  Additionally control of diabetes, hypertension alcohol cessation as usual is recommended.  Given his psychotropic medications, consider reducing and/or stopping Remeron, amitriptyline and gabapentin.  Agree with replacement with thiamine.    The patient is a 58 year old right-handed white male who presents with global weakness, confusion and just not feeling right over the last several days.  Patient is quite drowsy and this makes the history difficult.  He however has had evaluation at [facility] and believe he was sent home.  He has been noncompliant with his care including missing 2 days of dialysis.  He has a long history of alcoholism.  The patient reports that he mostly felt unsteady and weak all over.  He denies focal weakness or numbness.  He denies headaches, palpitations or chest pain.  He denies headaches, dysarthria or dysphagia.  The review of systems is most otherwise unrevealing.    GENERAL: The patient is quite  stuporous during the evaluation.  HEENT: There appears to be petechial hemorrhage involving the left lower facial region and upper extremities.  Neck is supple.  ABDOMEN: soft  EXTREMITIES: There is marked edema of the feet and legs bilaterally.  BACK: Normal  SKIN: Normal by inspection.    MENTAL STATUS: He is dozing during evaluation and requires continuous stimulation to keep awake.  He does communicates in a coherent fashion however when he is awake.  He follows commands bilaterally.  He reports that he is in the hospital at [facility].  He states the date as March 2020.  He seemed to have mild dysarthria but language is relatively in intact.  CRANIAL NERVES: Pupils are equal, round and reactive to light and accomodation; extra ocular movements are full, there is no significant nystagmus; visual fields are full; upper and lower facial muscles are normal in strength and symmetric, there is no flattening of the nasolabial folds; tongue is midline.  MOTOR: There is normal strength of the right upper extremity.  Bulk is also normal and so is tone.  There is no drift of the right upper extremity.  There is a profound drift of the other extremities.  The left leg strength is 3/5 and right leg is 2/5.  Left deltoid is 3 and handgrip 3.  The tone of the left side is reduced.  COORDINATION: There is frequent negative myoclonus of the upper extremities.  REFLEXES: Deep tendon reflexes are symmetrical and normal.   SENSATION: Normal to pain.   NIH stroke scale 2, 2, 2, 2, 2 total  equal 10.     Blood pressure (!) 145/75, pulse 83, temperature 97.9 F (36.6 C), temperature source Oral, resp. rate 17, height 5\' 11"  (1.803 m), weight 116.2 kg, SpO2 99 %.  Past Medical History:  Diagnosis Date  . Anemia   . Asthma   . Cellulitis and abscess of right lower extremity 03/01/2017  . CHF (congestive heart failure) ([place])   . Chronic kidney disease    dialysis m,w,f  . COPD (chronic  obstructive pulmonary disease) ([place])   . Coronary artery disease   . Diabetes mellitus without complication ([place])   . Edema extremities 03/01/2017   bilateral swelling  . GERD (gastroesophageal reflux disease)   . High cholesterol   . High triglycerides   . Hypertension   . Hypothyroidism   . Neuropathy   . Peripheral vascular disease ([place])   . Pneumonia   . Sleep apnea    can't use cpap d/t feelings of suffocation    Past Surgical History:  Procedure Laterality Date  . A/V FISTULAGRAM Right 08/27/2017   Procedure: A/V FISTULAGRAM;  Surgeon: Katha Cabal, MD;  Location: [place] [facility];  Service: Cardiovascular;  Laterality: Right;  . A/V FISTULAGRAM Right 11/04/2018   Procedure: A/V FISTULAGRAM;  Surgeon: Katha Cabal, MD;  Location: [place] [facility];  Service: Cardiovascular;  Laterality: Right;  . A/V SHUNT INTERVENTION N/A 06/23/2018   Procedure: A/V SHUNT INTERVENTION;  Surgeon: Algernon Huxley, MD;  Location: [place] [facility];  Service: Cardiovascular;  Laterality: N/A;  . APPENDECTOMY  2010  . AV FISTULA PLACEMENT Right 06/14/2017   Procedure: ARTERIOVENOUS (AV) FISTULA CREATION WRIST;  Surgeon: Katha Cabal, MD;  Location: [facility];  Service: Vascular;  Laterality: Right;  . DIALYSIS/PERMA CATHETER INSERTION N/A 03/05/2017   Procedure: Dialysis/Perma Catheter Insertion;  Surgeon: Katha Cabal, MD;  Location: [place] [facility];  Service: Cardiovascular;  Laterality: N/A;  . DIALYSIS/PERMA CATHETER INSERTION N/A 09/20/2017   Procedure: DIALYSIS/PERMA CATHETER INSERTION;  Surgeon: Katha Cabal, MD;  Location: [place] [facility];  Service: Cardiovascular;  Laterality: N/A;  . DIALYSIS/PERMA CATHETER INSERTION N/A 01/26/2019   Procedure: DIALYSIS/PERMA CATHETER INSERTION/Exchange;  Surgeon: Algernon Huxley, MD;  Location: [place] [facility];  Service: Cardiovascular;  Laterality: N/A;  . DIALYSIS/PERMA CATHETER INSERTION N/A 01/27/2019    Procedure: DIALYSIS/PERMA CATHETER INSERTION;  Surgeon: Katha Cabal, MD;  Location: [place] [facility];  Service: Cardiovascular;  Laterality: N/A;  . EXCHANGE OF A DIALYSIS CATHETER  03/29/2017   Procedure: Exchange Of A Dialysis Catheter;  Surgeon: Katha Cabal, MD;  Location: [place] [facility];  Service: Cardiovascular;;  . IRRIGATION AND DEBRIDEMENT FOOT Right 03/01/2017   Procedure: IRRIGATION AND DEBRIDEMENT FOOT;  Surgeon: Albertine Patricia, DPM;  Location: [facility];  Service: Podiatry;  Laterality: Right;  application of wound vac  . PERIPHERAL VASCULAR THROMBECTOMY Right 06/18/2018   Procedure: PERIPHERAL VASCULAR THROMBECTOMY;  Surgeon: Algernon Huxley, MD;  Location: [place] [facility];  Service: Cardiovascular;  Laterality: Right;  Marland Kitchen VASCULAR ACCESS DEVICE INSERTION Right 01/30/2019   Procedure: INSERTION OF HERO VASCULAR ACCESS DEVICE;  Surgeon: Katha Cabal, MD;  Location: [facility];  Service: Vascular;  Laterality: Right;    Family History  Problem Relation Age of Onset  . CAD Father   . Heart disease Father     Social History:  reports that he has quit smoking. His smokeless tobacco use includes snuff. He reports current alcohol use.  He reports that he does not use drugs.  Allergies:  Allergies  Allergen Reactions  . Codeine Nausea And Vomiting    Medications: Prior to Admission medications   Medication Sig Start Date End Date Taking? Authorizing Provider  amitriptyline (ELAVIL) 25 MG tablet Take 25 mg by mouth at bedtime.   Yes [provider]  calcium acetate (PHOSLO) 667 MG capsule Take 2 capsules (1,334 mg total) by mouth 3 (three) times daily with meals. 12/16/18  Yes Barton Dubois, MD  furosemide (LASIX) 80 MG tablet Take 80 mg by mouth daily.   Yes [provider]  gabapentin (NEURONTIN) 300 MG capsule Take 1 capsule (300 mg total) by mouth daily. 10/19/18  Yes Barton Dubois, MD  glipiZIDE (GLUCOTROL) 5 MG tablet Take 5 mg by  mouth daily before breakfast.  07/05/18  Yes [provider]  metoprolol tartrate (LOPRESSOR) 25 MG tablet Take 0.5 tablets (12.5 mg total) by mouth 2 (two) times daily. 10/19/18  Yes Barton Dubois, MD  midodrine (PROAMATINE) 10 MG tablet Take 1 tablet (10 mg total) by mouth 2 (two) times daily with a meal. Patient taking differently: Take 10 mg by mouth daily as needed (for low bp from dialysis).  10/19/18  Yes Barton Dubois, MD  mirtazapine (REMERON) 15 MG tablet Take 15 mg by mouth at bedtime.  07/12/18  Yes [provider]  mometasone-formoterol (DULERA) 100-5 MCG/ACT AERO Inhale 2 puffs into the lungs every morning. 12/16/18  Yes Barton Dubois, MD  omeprazole (PRILOSEC OTC) 20 MG tablet Take 1 tablet (20 mg total) by mouth daily. 10/19/18  Yes Barton Dubois, MD  oxyCODONE-acetaminophen (PERCOCET) 5-325 MG tablet Take 1-2 tablets by mouth every 6 (six) hours as needed for moderate pain or severe pain. 01/30/19 01/30/20 Yes Schnier, Dolores Lory, MD  PROAIR HFA [phone] Base) MCG/ACT inhaler Inhale 2 puffs into the lungs every 4 (four) hours as needed for wheezing or shortness of breath.  09/04/17  Yes [provider]  spironolactone (ALDACTONE) 25 MG tablet Take 25 mg by mouth daily.   Yes [provider]  traMADol (ULTRAM) 50 MG tablet Take 1 tablet (50 mg total) by mouth every 6 (six) hours as needed for moderate pain or severe pain. 01/30/19  Yes Gouru, Aruna, MD  insulin aspart (NOVOLOG) 100 UNIT/ML injection Inject 6 Units into the skin 3 (three) times daily with meals. 01/30/19   Gouru, Illene Silver, MD  insulin detemir (LEVEMIR) 100 UNIT/ML injection Inject 0.18 mLs (18 Units total) into the skin daily. 01/30/19   Nicholes Mango, MD  warfarin (COUMADIN) 5 MG tablet Take 5 mg by mouth daily at 6 PM.    [provider]    Scheduled Meds: . aspirin EC  81 mg Oral Daily  . calcium acetate  1,334 mg Oral TID WC  . Chlorhexidine Gluconate Cloth  6 each Topical Q0600   . folic acid  1 mg Oral Daily  . insulin aspart  0-9 Units Subcutaneous TID WC  . insulin detemir  9 Units Subcutaneous Daily  . LORazepam  0-4 mg Intravenous Q6H   Followed by  . [START ON 02/13/2019] LORazepam  0-4 mg Intravenous Q12H  . metoprolol tartrate  12.5 mg Oral BID  . mometasone-formoterol  2 puff Inhalation q morning - 10a  . multivitamin with minerals  1 tablet Oral Daily  . pantoprazole  40 mg Oral Daily  . sodium bicarbonate  650 mg Oral BID  . spironolactone  25 mg Oral Daily  .  thiamine  100 mg Oral Daily   Or  . thiamine  100 mg Intravenous Daily  . warfarin  5 mg Oral q1800  . Warfarin - Pharmacist Dosing Inpatient   Does not apply q1800   Continuous Infusions: . sodium chloride    . sodium chloride    . sodium chloride    . cefTRIAXone (ROCEPHIN)  IV 2 g (02/12/19 1112)   PRN Meds:.sodium chloride, sodium chloride, acetaminophen **OR** acetaminophen, albuterol, alteplase, heparin, heparin, hydrALAZINE, LORazepam **OR** LORazepam, ondansetron **OR** ondansetron (ZOFRAN) IV     Results for orders placed or performed during the hospital encounter of 02/10/19 (from the past 48 hour(s))  CBC with Differential/Platelet     Status: Abnormal   Collection Time: 02/10/19 11:57 PM  Result Value Ref Range   WBC 3.9 (L) 4.0 - 10.5 K/uL   RBC 2.67 (L) 4.22 - 5.81 MIL/uL   Hemoglobin 8.0 (L) 13.0 - 17.0 g/dL   HCT 24.7 (L) 39.0 - 52.0 %   MCV 92.5 80.0 - 100.0 fL   MCH 30.0 26.0 - 34.0 pg   MCHC 32.4 30.0 - 36.0 g/dL   RDW 15.2 11.5 - 15.5 %   Platelets 169 150 - 400 K/uL   nRBC 0.0 0.0 - 0.2 %   Neutrophils Relative % 69 %   Neutro Abs 2.7 1.7 - 7.7 K/uL   Lymphocytes Relative 23 %   Lymphs Abs 0.9 0.7 - 4.0 K/uL   Monocytes Relative 6 %   Monocytes Absolute 0.2 0.1 - 1.0 K/uL   Eosinophils Relative 1 %   Eosinophils Absolute 0.0 0.0 - 0.5 K/uL   Basophils Relative 1 %   Basophils Absolute 0.0 0.0 - 0.1 K/uL   Immature Granulocytes 0 %   Abs Immature  Granulocytes 0.01 0.00 - 0.07 K/uL    Comment: Performed at [facility], [address], [place], [place] [address]  Comprehensive metabolic panel     Status: Abnormal   Collection Time: 02/10/19 11:57 PM  Result Value Ref Range   Sodium 125 (L) 135 - 145 mmol/L   Potassium 4.4 3.5 - 5.1 mmol/L   Chloride 89 (L) 98 - 111 mmol/L   CO2 17 (L) 22 - 32 mmol/L   Glucose, Bld 305 (H) 70 - 99 mg/dL   BUN 73 (H) 6 - 20 mg/dL   Creatinine, Ser 7.71 (H) 0.61 - 1.24 mg/dL   Calcium 8.4 (L) 8.9 - 10.3 mg/dL   Total Protein 6.5 6.5 - 8.1 g/dL   Albumin 2.7 (L) 3.5 - 5.0 g/dL   AST 48 (H) 15 - 41 U/L   ALT 34 0 - 44 U/L   Alkaline Phosphatase 232 (H) 38 - 126 U/L   Total Bilirubin 0.7 0.3 - 1.2 mg/dL   GFR calc non Af Amer 7 (L) >60 mL/min   GFR calc Af Amer 8 (L) >60 mL/min   Anion gap 19 (H) 5 - 15    Comment: Performed at [facility], [address] [place], [place] [address]  Troponin I - ONCE - STAT     Status: Abnormal   Collection Time: 02/10/19 11:57 PM  Result Value Ref Range   Troponin I 0.08 (HH) <0.03 ng/mL    Comment: CRITICAL RESULT CALLED TO, READ BACK BY AND VERIFIED WITH: Lynnea Ferrier @0059  02/11/19 [facility] Performed at [facility], [address], [place], [place] [address]   Hemoglobin A1c     Status: Abnormal   Collection Time: 02/10/19 11:57  PM  Result Value Ref Range   Hgb A1c MFr Bld 8.2 (H) 4.8 - 5.6 %    Comment: (NOTE) Pre diabetes:          5.7%-6.4% Diabetes:              >6.4% Glycemic control for   <7.0% adults with diabetes    Mean Plasma Glucose 188.64 mg/dL    Comment: Performed at [facility] [address] [place], [place] [address]  SARS Coronavirus 2 (CEPHEID - Performed in [facility] hospital lab), Hosp Order     Status: None   Collection Time: 02/10/19 11:59 PM  Result Value Ref Range   SARS Coronavirus 2 NEGATIVE NEGATIVE    Comment: (NOTE) If result is NEGATIVE SARS-CoV-2 target nucleic acids are NOT DETECTED. The SARS-CoV-2 RNA is  generally detectable in upper and lower  respiratory specimens during the acute phase of infection. The lowest  concentration of SARS-CoV-2 viral copies this assay can detect is 250  copies / mL. A negative result does not preclude SARS-CoV-2 infection  and should not be used as the sole basis for treatment or other  patient management decisions.  A negative result may occur with  improper specimen collection / handling, submission of specimen other  than nasopharyngeal swab, presence of viral mutation(s) within the  areas targeted by this assay, and inadequate number of viral copies  (<250 copies / mL). A negative result must be combined with clinical  observations, patient history, and epidemiological information. If result is POSITIVE SARS-CoV-2 target nucleic acids are DETECTED. The SARS-CoV-2 RNA is generally detectable in upper and lower  respiratory specimens dur ing the acute phase of infection.  Positive  results are indicative of active infection with SARS-CoV-2.  Clinical  correlation with patient history and other diagnostic information is  necessary to determine patient infection status.  Positive results do  not rule out bacterial infection or co-infection with other viruses. If result is PRESUMPTIVE POSTIVE SARS-CoV-2 nucleic acids MAY BE PRESENT.   A presumptive positive result was obtained on the submitted specimen  and confirmed on repeat testing.  While 2019 novel coronavirus  (SARS-CoV-2) nucleic acids may be present in the submitted sample  additional confirmatory testing may be necessary for epidemiological  and / or clinical management purposes  to differentiate between  SARS-CoV-2 and other Sarbecovirus currently known to infect humans.  If clinically indicated additional testing with an alternate test  methodology [phone]) is advised. The SARS-CoV-2 RNA is generally  detectable in upper and lower respiratory sp ecimens during the acute  phase of infection.  The expected result is Negative. Fact Sheet for Patients:  StrictlyIdeas.no Fact Sheet for Healthcare Providers: BankingDealers.co.za This test is not yet approved or cleared by the [place] FDA and has been authorized for detection and/or diagnosis of SARS-CoV-2 by FDA under an Emergency Use Authorization (EUA).  This EUA will remain in effect (meaning this test can be used) for the duration of the COVID-19 declaration under Section 564(b)(1) of the Act, 21 U.S.C. section 360bbb-3(b)(1), unless the authorization is terminated or revoked sooner. Performed at [facility], [address] [place], [place] [address]   Lactic acid, plasma     Status: Abnormal   Collection Time: 02/11/19 12:32 AM  Result Value Ref Range   Lactic Acid, Venous 3.5 (HH) 0.5 - 1.9 mmol/L    Comment: CRITICAL RESULT CALLED TO, READ BACK BY AND VERIFIED WITH: Lynnea Ferrier @0059  02/11/19 [facility] Performed  at [facility], [address] [place], [place] [address]   Brain natriuretic peptide     Status: Abnormal   Collection Time: 02/11/19 12:32 AM  Result Value Ref Range   B Natriuretic Peptide 471.0 (H) 0.0 - 100.0 pg/mL    Comment: Performed at [facility], [address], [place], [place] [address]  Blood culture (routine x 2)     Status: None (Preliminary result)   Collection Time: 02/11/19  1:28 AM  Result Value Ref Range   Specimen Description BLOOD LEFT HAND    Special Requests      BOTTLES DRAWN AEROBIC AND ANAEROBIC Blood Culture adequate volume   Culture      NO GROWTH 1 DAY Performed at [facility], [address], [place], [place] [address]    Report Status PENDING   Blood culture (routine x 2)     Status: None (Preliminary result)   Collection Time: 02/11/19  1:31 AM  Result Value Ref Range   Specimen Description BLOOD LEFT HAND    Special Requests      AEROBIC BOTTLE ONLY Blood Culture results may not be optimal due to an inadequate volume of  blood received in culture bottles   Culture      NO GROWTH 1 DAY Performed at [facility], [address], [place], [place] [address]    Report Status PENDING   Glucose, capillary     Status: Abnormal   Collection Time: 02/11/19  2:57 AM  Result Value Ref Range   Glucose-Capillary 253 (H) 70 - 99 mg/dL  CBC     Status: Abnormal   Collection Time: 02/11/19  3:15 AM  Result Value Ref Range   WBC 4.8 4.0 - 10.5 K/uL   RBC 2.68 (L) 4.22 - 5.81 MIL/uL   Hemoglobin 8.1 (L) 13.0 - 17.0 g/dL   HCT 25.3 (L) 39.0 - 52.0 %   MCV 94.4 80.0 - 100.0 fL   MCH 30.2 26.0 - 34.0 pg   MCHC 32.0 30.0 - 36.0 g/dL   RDW 15.3 11.5 - 15.5 %   Platelets 148 (L) 150 - 400 K/uL   nRBC 0.0 0.0 - 0.2 %    Comment: Performed at [facility], [address], [place], [place] [address]  Comprehensive metabolic panel     Status: Abnormal   Collection Time: 02/11/19  3:15 AM  Result Value Ref Range   Sodium 126 (L) 135 - 145 mmol/L   Potassium 4.6 3.5 - 5.1 mmol/L   Chloride 91 (L) 98 - 111 mmol/L   CO2 16 (L) 22 - 32 mmol/L   Glucose, Bld 275 (H) 70 - 99 mg/dL   BUN 75 (H) 6 - 20 mg/dL   Creatinine, Ser 7.86 (H) 0.61 - 1.24 mg/dL   Calcium 8.4 (L) 8.9 - 10.3 mg/dL   Total Protein 6.5 6.5 - 8.1 g/dL   Albumin 2.8 (L) 3.5 - 5.0 g/dL   AST 43 (H) 15 - 41 U/L   ALT 30 0 - 44 U/L   Alkaline Phosphatase 224 (H) 38 - 126 U/L   Total Bilirubin 0.7 0.3 - 1.2 mg/dL   GFR calc non Af Amer 7 (L) >60 mL/min   GFR calc Af Amer 8 (L) >60 mL/min   Anion gap 19 (H) 5 - 15    Comment: Performed at [facility], [address] [place], [place] [address]  Protime-INR     Status: Abnormal   Collection Time: 02/11/19  3:15 AM  Result Value Ref Range   Prothrombin Time 26.2 (H) 11.4 - 15.2 seconds   INR 2.4 (H) 0.8 - 1.2    Comment: (NOTE) INR goal varies based on device and disease states. Performed at [facility], [address] [place], [place] [address]   Hepatitis B surface antigen     Status: None   Collection  Time: 02/11/19  3:15 AM  Result Value Ref Range   Hepatitis B Surface Ag Negative Negative    Comment: (NOTE) Performed At: [facility] [place], [place] [id number] Rush Farmer MD CW:[id number]   Glucose, capillary     Status: Abnormal   Collection Time: 02/11/19  7:25 AM  Result Value Ref Range   Glucose-Capillary 206 (H) 70 - 99 mg/dL  Ethanol     Status: None   Collection Time: 02/11/19 10:13 AM  Result Value Ref Range   Alcohol, Ethyl (B) <10 <10 mg/dL    Comment: (NOTE) Lowest detectable limit for serum alcohol is 10 mg/dL. For medical purposes only. Performed at [facility], [address], [place], [place] [address]   Gastrointestinal Panel by PCR , Stool     Status: None   Collection Time: 02/11/19 10:13 AM  Result Value Ref Range   Campylobacter species NOT DETECTED NOT DETECTED   Plesimonas shigelloides NOT DETECTED NOT DETECTED   Salmonella species NOT DETECTED NOT DETECTED   Yersinia enterocolitica NOT DETECTED NOT DETECTED   Vibrio species NOT DETECTED NOT DETECTED   Vibrio cholerae NOT DETECTED NOT DETECTED   Enteroaggregative E coli (EAEC) NOT DETECTED NOT DETECTED   Enteropathogenic E coli (EPEC) NOT DETECTED NOT DETECTED   Enterotoxigenic E coli (ETEC) NOT DETECTED NOT DETECTED   Shiga like toxin producing E coli (STEC) NOT DETECTED NOT DETECTED   Shigella/Enteroinvasive E coli (EIEC) NOT DETECTED NOT DETECTED   Cryptosporidium NOT DETECTED NOT DETECTED   Cyclospora cayetanensis NOT DETECTED NOT DETECTED   Entamoeba histolytica NOT DETECTED NOT DETECTED   Giardia lamblia NOT DETECTED NOT DETECTED   Adenovirus F40/41 NOT DETECTED NOT DETECTED   Astrovirus NOT DETECTED NOT DETECTED   Norovirus GI/GII NOT DETECTED NOT DETECTED   Rotavirus A NOT DETECTED NOT DETECTED   Sapovirus (I, II, IV, and V) NOT DETECTED NOT DETECTED    Comment: Performed at [facility], [address] [place], [place] [address]  Glucose, capillary      Status: Abnormal   Collection Time: 02/11/19 11:06 AM  Result Value Ref Range   Glucose-Capillary 160 (H) 70 - 99 mg/dL  Urinalysis, Routine w reflex microscopic     Status: Abnormal   Collection Time: 02/11/19  1:36 PM  Result Value Ref Range   Color, Urine RED (A) YELLOW    Comment: BIOCHEMICALS MAY BE AFFECTED BY COLOR   APPearance HAZY (A) CLEAR   Specific Gravity, Urine 1.010 1.005 - 1.030   pH 7.0 5.0 - 8.0   Glucose, UA 250 (A) NEGATIVE mg/dL   Hgb urine dipstick LARGE (A) NEGATIVE   Bilirubin Urine NEGATIVE NEGATIVE   Ketones, ur TRACE (A) NEGATIVE mg/dL   Protein, ur >300 (A) NEGATIVE mg/dL   Nitrite POSITIVE (A) NEGATIVE   Leukocytes,Ua SMALL (A) NEGATIVE    Comment: Performed at [facility], [address], [place], [place] [address]  Culture, Urine     Status: None   Collection Time: 02/11/19  1:36 PM  Result Value Ref Range   Specimen Description      URINE, RANDOM  Performed at [facility], [address] [place], [place] [address]    Special Requests      NONE Performed at [facility], [address], [place], [place] [address]    Culture      NO GROWTH Performed at [facility], [place] [address] [place], [place] [address]    Report Status 02/12/2019 FINAL   Urinalysis, Microscopic (reflex)     Status: Abnormal   Collection Time: 02/11/19  1:36 PM  Result Value Ref Range   RBC / HPF >50 0 - 5 RBC/hpf   WBC, UA 0-5 0 - 5 WBC/hpf   Bacteria, UA RARE (A) NONE SEEN   Squamous Epithelial / LPF 0-5 0 - 5    Comment: Performed at [facility], [address] [place] [address] [place], [place] [address]  Glucose, capillary     Status: Abnormal   Collection Time: 02/11/19  4:10 PM  Result Value Ref Range   Glucose-Capillary 145 (H) 70 - 99 mg/dL  Glucose, capillary     Status: Abnormal   Collection Time: 02/11/19  9:58 PM  Result Value Ref Range   Glucose-Capillary 69 (L) 70 - 99 mg/dL  Protime-INR     Status: Abnormal   Collection Time: 02/12/19  5:30 AM  Result  Value Ref Range   Prothrombin Time 21.8 (H) 11.4 - 15.2 seconds   INR 1.9 (H) 0.8 - 1.2    Comment: (NOTE) INR goal varies based on device and disease states. Performed at [facility], [address], [place], [place] [address]   CBC     Status: Abnormal   Collection Time: 02/12/19  5:30 AM  Result Value Ref Range   WBC 3.0 (L) 4.0 - 10.5 K/uL   RBC 2.68 (L) 4.22 - 5.81 MIL/uL   Hemoglobin 8.1 (L) 13.0 - 17.0 g/dL   HCT 24.9 (L) 39.0 - 52.0 %   MCV 92.9 80.0 - 100.0 fL   MCH 30.2 26.0 - 34.0 pg   MCHC 32.5 30.0 - 36.0 g/dL   RDW 15.6 (H) 11.5 - 15.5 %   Platelets 135 (L) 150 - 400 K/uL   nRBC 0.0 0.0 - 0.2 %    Comment: Performed at [facility], [address], [place] [employer], [place] [id number]  Renal function panel     Status: Abnormal   Collection Time: 02/12/19  5:30 AM  Result Value Ref Range   Sodium 137 135 - 145 mmol/L    Comment: DELTA CHECK NOTED   Potassium 3.4 (L) 3.5 - 5.1 mmol/L   Chloride 98 98 - 111 mmol/L   CO2 27 22 - 32 mmol/L   Glucose, Bld 55 (L) 70 - 99 mg/dL   BUN 30 (H) 6 - 20 mg/dL   Creatinine, Ser 4.57 (H) 0.61 - 1.24 mg/dL   Calcium 8.2 (L) 8.9 - 10.3 mg/dL   Phosphorus 3.1 2.5 - 4.6 mg/dL   Albumin 2.5 (L) 3.5 - 5.0 g/dL   GFR calc non Af Amer 13 (L) >60 mL/min   GFR calc Af Amer 15 (L) >60 mL/min   Anion gap 12 5 - 15    Comment: Performed at [facility], [address], [place], [place] [address]  Glucose, capillary     Status: Abnormal   Collection Time: 02/12/19  6:45 AM  Result Value Ref Range   Glucose-Capillary 47 (L) 70 - 99 mg/dL   Comment 1 Notify RN   Glucose, capillary     Status: Abnormal  Collection Time: 02/12/19  7:16 AM  Result Value Ref Range   Glucose-Capillary 118 (H) 70 - 99 mg/dL  Blood gas, arterial     Status: Abnormal   Collection Time: 02/12/19  9:25 AM  Result Value Ref Range   FIO2 28.00    pH, Arterial 7.519 (H) 7.350 - 7.450   pCO2 arterial 34.8 32.0 - 48.0 mmHg   pO2, Arterial 108 83.0 - 108.0 mmHg    Bicarbonate 29.2 (H) 20.0 - 28.0 mmol/L   Acid-Base Excess 5.1 (H) 0.0 - 2.0 mmol/L   O2 Saturation 98.7 %   Patient temperature 37.0    Allens test (pass/fail) PASS PASS    Comment: Performed at [facility], [address], [place], [place] [address]  Ammonia     Status: None   Collection Time: 02/12/19 10:59 AM  Result Value Ref Range   Ammonia 23 9 - 35 umol/L    Comment: Performed at [facility], [address] [place], [place] [address]  TSH     Status: None   Collection Time: 02/12/19 10:59 AM  Result Value Ref Range   TSH 3.725 0.350 - 4.500 uIU/mL    Comment: Performed by a 3rd Generation assay with a functional sensitivity of <=0.01 uIU/mL. Performed at [facility], [address] [place], [place] [address]   Glucose, capillary     Status: None   Collection Time: 02/12/19 11:12 AM  Result Value Ref Range   Glucose-Capillary 71 70 - 99 mg/dL  Glucose, capillary     Status: None   Collection Time: 02/12/19  4:09 PM  Result Value Ref Range   Glucose-Capillary 71 70 - 99 mg/dL    Studies/Results:   BRAIN MRI MRA FINDINGS: The patient was unable to remain motionless for the exam. Small or subtle lesions could be overlooked. The study is of marginal diagnostic utility.  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 Additional area of restricted diffusion involves the RIGHT parieto-occipital cortex. No definite hemorrhage, mass lesion, hydrocephalus, or extra-axial fluid.  Generalized atrophy, premature for age. Minor white matter disease, unchanged from priors. T1 shortening, LEFT occipital cortex, better seen on previous study.  Vascular: Reported separately  Skull and upper cervical spine: No acute findings.   Sinuses/Orbits: Negative.  Other: None.  MRA HEAD FINDINGS  RIGHT ANTERIOR CIRCULATION: Skull base cervical and horizontal petrous ICA segment unremarkable. Moderate stenosis versus artifact due to motion at the petrous genu. Cavernous ICA patent, with a moderate distal stenosis. Supraclinoid ICA patent. RIGHT M1 MCA flow is not established, beyond the proximal M1 segment, and this vessel could be occluded although I cannot exclude motion degradation as causative. RIGHT A1 ACA flow is established.  LEFT ANTERIOR CIRCULATION: Skull base cervical and horizontal petrous ICA segment unremarkable. Moderate stenosis versus artifact due to motion at the petrous genu. Cavernous ICA widely patent. Supraclinoid ICA widely patent. LEFT M1 MCA flow is established. LEFT A1 ACA flow is established.  POSTERIOR CIRCULATION: The basilar artery is patent. The LEFT vertebral is the dominant contributor. There is no visible posterior circulation stenosis, although no flow related enhancement can be seen in the RIGHT vertebral.  IMPRESSION: Significant motion degradation. The study is of marginal diagnostic utility.  Multifocal areas of acute infarction, RIGHT MCA territory. No visible hemorrhage.  RIGHT M1 MCA  large vessel occlusion cannot be excluded although the appearance may be artifactual due to significant motion degradation. If concern for such, recommend CTA head neck, when the patient is able to cooperate to remain motionless.  Patency of both internal carotid arteries as well as the basilar artery and LEFT vertebral artery is established. 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    Or the brain MRI and MRA are reviewed in person.  The MRI shows multiple areas of increased signal involving the right basal ganglia, insular cortex and the temporal and parietal areas.  The territory approximates the right MCA distribution.  There is corresponding reduced  signal seen on DWI ADC scan.  There is a minimal white matter changes and no encephalomalacia.  No hemorrhage is appreciated.  The study is degraded by motion artifact.  MRA shows no significant disease of the vertebral basilar and ICA but distal vessels are markedly degraded by motion artifact bilaterally.    Wendy Mikles A. Merlene [name], M.D.  Diplomate, Tax adviser of Psychiatry and Neurology ( Neurology). 02/12/2019, 5:00 PM
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
## 2019-02-12 NOTE — Progress Notes (Signed)
ANTICOAGULATION CONSULT NOTE - -Consult  Pharmacy Consult for warfarin Indication: VTE prophylaxis  Allergies  Allergen Reactions  . Codeine Nausea And Vomiting    Patient Measurements: Height: 5\' 11"  (180.3 cm) Weight: 256 lb 2.8 oz (116.2 kg) IBW/kg (Calculated) : 75.3  Vital Signs: Temp: 98 F (36.7 C) (05/07 0600) Temp Source: Oral (05/07 0600) BP: 116/58 (05/07 0600) Pulse Rate: 84 (05/07 0600)  Labs: Recent Labs    02/10/19 1143 02/10/19 2357 02/11/19 0315 02/12/19 0530  HGB 8.2* 8.0* 8.1* 8.1*  HCT 26.0* 24.7* 25.3* 24.9*  PLT 171 169 148* 135*  LABPROT 21.0*  --  26.2* 21.8*  INR 1.8*  --  2.4* 1.9*  CREATININE 7.67* 7.71* 7.86* 4.57*  TROPONINI  --  0.08*  --   --     Estimated Creatinine Clearance: 23.1 mL/min (A) (by C-G formula based on SCr of 4.57 mg/dL (H)).   Medications:  Medications Prior to Admission  Medication Sig Dispense Refill Last Dose  . amitriptyline (ELAVIL) 25 MG tablet Take 25 mg by mouth at bedtime.   02/09/2019  . calcium acetate (PHOSLO) 667 MG capsule Take 2 capsules (1,334 mg total) by mouth 3 (three) times daily with meals. 180 capsule 1 02/09/2019  . furosemide (LASIX) 80 MG tablet Take 80 mg by mouth daily.   02/09/2019  . gabapentin (NEURONTIN) 300 MG capsule Take 1 capsule (300 mg total) by mouth daily.   02/09/2019  . glipiZIDE (GLUCOTROL) 5 MG tablet Take 5 mg by mouth daily before breakfast.   3 02/09/2019  . metoprolol tartrate (LOPRESSOR) 25 MG tablet Take 0.5 tablets (12.5 mg total) by mouth 2 (two) times daily. 60 tablet 1 02/09/2019 at 1800  . midodrine (PROAMATINE) 10 MG tablet Take 1 tablet (10 mg total) by mouth 2 (two) times daily with a meal. (Patient taking differently: Take 10 mg by mouth daily as needed (for low bp from dialysis). ) 60 tablet 1 unknown  . mirtazapine (REMERON) 15 MG tablet Take 15 mg by mouth at bedtime.   6 02/09/2019  . mometasone-formoterol (DULERA) 100-5 MCG/ACT AERO Inhale 2 puffs into the lungs every  morning. 13 g 2 02/09/2019  . omeprazole (PRILOSEC OTC) 20 MG tablet Take 1 tablet (20 mg total) by mouth daily. 30 tablet 1 02/09/2019  . oxyCODONE-acetaminophen (PERCOCET) 5-325 MG tablet Take 1-2 tablets by mouth every 6 (six) hours as needed for moderate pain or severe pain. 45 tablet 0 unknown  . PROAIR HFA 108 (90 Base) MCG/ACT inhaler Inhale 2 puffs into the lungs every 4 (four) hours as needed for wheezing or shortness of breath.   0 unknown  . spironolactone (ALDACTONE) 25 MG tablet Take 25 mg by mouth daily.   02/09/2019  . traMADol (ULTRAM) 50 MG tablet Take 1 tablet (50 mg total) by mouth every 6 (six) hours as needed for moderate pain or severe pain. 15 tablet 0 unknown  . insulin aspart (NOVOLOG) 100 UNIT/ML injection Inject 6 Units into the skin 3 (three) times daily with meals. 10 mL 11   . insulin detemir (LEVEMIR) 100 UNIT/ML injection Inject 0.18 mLs (18 Units total) into the skin daily. 10 mL 11   . warfarin (COUMADIN) 5 MG tablet Take 5 mg by mouth daily at 6 PM.   02/09/2019 at 1800   Scheduled:  . aspirin EC  81 mg Oral Daily  . calcium acetate  1,334 mg Oral TID WC  . Chlorhexidine Gluconate Cloth  6 each Topical Q0600  .  folic acid  1 mg Oral Daily  . insulin aspart  0-9 Units Subcutaneous TID WC  . insulin detemir  9 Units Subcutaneous Daily  . LORazepam  0-4 mg Intravenous Q6H   Followed by  . [START ON 02/13/2019] LORazepam  0-4 mg Intravenous Q12H  . metoprolol tartrate  12.5 mg Oral BID  . mometasone-formoterol  2 puff Inhalation q morning - 10a  . multivitamin with minerals  1 tablet Oral Daily  . pantoprazole  40 mg Oral Daily  . sodium bicarbonate  650 mg Oral BID  . spironolactone  25 mg Oral Daily  . thiamine  100 mg Oral Daily   Or  . thiamine  100 mg Intravenous Daily  . warfarin  5 mg Oral q1800  . Warfarin - Pharmacist Dosing Inpatient   Does not apply q1800   Infusions:  . sodium chloride    . sodium chloride    . sodium chloride    . cefTRIAXone  (ROCEPHIN)  IV 2 g (02/11/19 1054)   PRN: sodium chloride, sodium chloride, acetaminophen **OR** acetaminophen, albuterol, alteplase, heparin, heparin, hydrALAZINE, LORazepam **OR** LORazepam, ondansetron **OR** ondansetron (ZOFRAN) IV Anti-infectives (From admission, onward)   Start     Dose/Rate Route Frequency Ordered Stop   02/11/19 1000  cefTRIAXone (ROCEPHIN) 2 g in sodium chloride 0.9 % 100 mL IVPB     2 g 200 mL/hr over 30 Minutes Intravenous Every 24 hours 02/11/19 0230     02/11/19 0115  piperacillin-tazobactam (ZOSYN) IVPB 3.375 g     3.375 g 100 mL/hr over 30 Minutes Intravenous  Once 02/11/19 0104 02/11/19 0212      Assessment: 58 year old male presents with recent SVC thrombosis anticoagulated on warfarin 5mg  po daily at home. Admit INR 1.8, repeat was 2.4.   Today INR is 1.9, slightly subtherapeutic  Goal of Therapy:  INR 2-3 Monitor platelets by anticoagulation protocol: Yes   Plan:  Warfarin 5mg  po daily, may need to increase if INR does not rise tomorrow  Monitor INR daily as well as CBC frequently.  Steven [name] 02/12/2019,7:59 AM

## 2019-02-12 NOTE — TOC Progression Note (Signed)
Transition of Care [facility]) - Progression Note    Patient Details  Name: Steven [name] MRN: [id number] Date of Birth: [birth year]  [facility] CM/SW Contact  Roda Shutters Margretta Sidle, RN Phone Number: 02/12/2019, 3:41 PM  Clinical Narrative:   Pt's only bed offer at this time is [facility]. TOC notified by Romualdo Bolk, [facility] rep, that pt active with services pta. Pt confused and drowsy today. TOC will cont to follow.     Expected Discharge Plan: Skilled Nursing Facility Barriers to Discharge: SNF Pending bed offer  Expected Discharge Plan and Services Expected Discharge Plan: [place] Choice: [place] arrangements for the past 2 months: Single Family Home Expected Discharge Date: 02/15/19                      Social Determinants of Health (SDOH) Interventions Alcohol Brief Interventions/Follow-up: Alcohol Education  Readmission Risk Interventions No flowsheet data found.

## 2019-02-12 NOTE — Progress Notes (Signed)
Hypoglycemic Event  CBG: 55  Treatment: 8 oz juice/soda  Symptoms: Shaky  Follow-up CBG: [id number] CBG Result:47  Possible Reasons for Event: Inadequate meal intake  Comments/MD notified: notified MD. MD ok to push amp of D50    Steven [name]

## 2019-02-13 ENCOUNTER — Inpatient Hospital Stay (HOSPITAL_COMMUNITY): Payer: Medicare Other

## 2019-02-13 DIAGNOSIS — I639 Cerebral infarction, unspecified: Secondary | ICD-10-CM

## 2019-02-13 LAB — DRUG PROFILE, UR, 9 DRUGS (LABCORP)
Amphetamines, Urine: NEGATIVE ng/mL
Barbiturate, Ur: NEGATIVE ng/mL
Benzodiazepine Quant, Ur: NEGATIVE ng/mL
Cannabinoid Quant, Ur: NEGATIVE ng/mL
Cocaine (Metab.): NEGATIVE ng/mL
Methadone Screen, Urine: NEGATIVE ng/mL
Opiate Quant, Ur: NEGATIVE ng/mL
Phencyclidine, Ur: NEGATIVE ng/mL
Propoxyphene, Urine: NEGATIVE ng/mL

## 2019-02-13 LAB — RENAL FUNCTION PANEL
Albumin: 2.5 g/dL — ABNORMAL LOW (ref 3.5–5.0)
Anion gap: 11 (ref 5–15)
BUN: 30 mg/dL — ABNORMAL HIGH (ref 6–20)
CO2: 27 mmol/L (ref 22–32)
Calcium: 8 mg/dL — ABNORMAL LOW (ref 8.9–10.3)
Chloride: 99 mmol/L (ref 98–111)
Creatinine, Ser: 5.4 mg/dL — ABNORMAL HIGH (ref 0.61–1.24)
GFR calc Af Amer: 13 mL/min — ABNORMAL LOW (ref >60)
GFR calc non Af Amer: 11 mL/min — ABNORMAL LOW (ref >60)
Glucose, Bld: 100 mg/dL — ABNORMAL HIGH (ref 70–99)
Phosphorus: 4.1 mg/dL (ref 2.5–4.6)
Potassium: 3.9 mmol/L (ref 3.5–5.1)
Sodium: 137 mmol/L (ref 135–145)

## 2019-02-13 LAB — ECHOCARDIOGRAM COMPLETE
Height: 71 in
Weight: 4014.14 oz

## 2019-02-13 LAB — GLUCOSE, CAPILLARY
Glucose-Capillary: 79 mg/dL (ref 70–99)
Glucose-Capillary: 136 mg/dL — ABNORMAL HIGH (ref 70–99)
Glucose-Capillary: 95 mg/dL (ref 70–99)
Glucose-Capillary: 106 mg/dL — ABNORMAL HIGH (ref 70–99)

## 2019-02-13 LAB — CBC
HCT: 24.9 % — ABNORMAL LOW (ref 39.0–52.0)
Hemoglobin: 7.5 g/dL — ABNORMAL LOW (ref 13.0–17.0)
MCH: 29.4 pg (ref 26.0–34.0)
MCHC: 30.1 g/dL (ref 30.0–36.0)
MCV: 97.6 fL (ref 80.0–100.0)
Platelets: 123 10*3/uL — ABNORMAL LOW (ref 150–400)
RBC: 2.55 MIL/uL — ABNORMAL LOW (ref 4.22–5.81)
RDW: 15.8 % — ABNORMAL HIGH (ref 11.5–15.5)
WBC: 3.3 10*3/uL — ABNORMAL LOW (ref 4.0–10.5)
nRBC: 0 % (ref 0.0–0.2)

## 2019-02-13 LAB — LIPID PANEL
Cholesterol: 230 mg/dL — ABNORMAL HIGH (ref 0–200)
HDL: 34 mg/dL — ABNORMAL LOW (ref >40)
LDL Cholesterol: 157 mg/dL — ABNORMAL HIGH (ref 0–99)
Total CHOL/HDL Ratio: 6.8 RATIO
Triglycerides: 196 mg/dL — ABNORMAL HIGH (ref <150)
VLDL: 39 mg/dL (ref 0–40)

## 2019-02-13 LAB — PROTIME-INR
INR: 2.2 — ABNORMAL HIGH (ref 0.8–1.2)
Prothrombin Time: 24.2 seconds — ABNORMAL HIGH (ref 11.4–15.2)

## 2019-02-13 LAB — MAGNESIUM: Magnesium: 1.9 mg/dL (ref 1.7–2.4)

## 2019-02-13 NOTE — Procedures (Signed)
    HEMODIALYSIS TREATMENT NOTE:  4 hour heparin-free dialysis session completed via right femoral tunneled catheter.  Goal met: 0.5L removed without interruption in ultrafiltration.  All blood was returned.  Rockwell Alexandria, RN

## 2019-02-13 NOTE — Progress Notes (Signed)
ANTICOAGULATION CONSULT NOTE - Follow Up Consult  Pharmacy Consult for transition from warfarin to apixaban Indication: SVC thrombosis and CVA   Allergies  Allergen Reactions  . Codeine Nausea And Vomiting    Patient Measurements: Height: 5\' 11"  (180.3 cm) Weight: 250 lb 14.1 oz (113.8 kg) IBW/kg (Calculated) : 75.3  Vital Signs: Temp: 98.2 F (36.8 C) (05/08 0900) Temp Source: Oral (05/08 0900) BP: 128/73 (05/08 0900) Pulse Rate: 75 (05/08 0900)  Labs: Recent Labs    02/10/19 2357 02/11/19 0315 02/12/19 0530 02/13/19 0449  HGB 8.0* 8.1* 8.1* 7.5*  HCT 24.7* 25.3* 24.9* 24.9*  PLT 169 148* 135* 123*  LABPROT  --  26.2* 21.8* 24.2*  INR  --  2.4* 1.9* 2.2*  CREATININE 7.71* 7.86* 4.57* 5.40*  TROPONINI 0.08*  --   --   --     Estimated Creatinine Clearance: 19.4 mL/min (A) (by C-G formula based on SCr of 5.4 mg/dL (H)).   Medications:  Medications Prior to Admission  Medication Sig Dispense Refill Last Dose  . amitriptyline (ELAVIL) 25 MG tablet Take 25 mg by mouth at bedtime.   02/09/2019  . calcium acetate (PHOSLO) 667 MG capsule Take 2 capsules (1,334 mg total) by mouth 3 (three) times daily with meals. 180 capsule 1 02/09/2019  . furosemide (LASIX) 80 MG tablet Take 80 mg by mouth daily.   02/09/2019  . gabapentin (NEURONTIN) 300 MG capsule Take 1 capsule (300 mg total) by mouth daily.   02/09/2019  . glipiZIDE (GLUCOTROL) 5 MG tablet Take 5 mg by mouth daily before breakfast.   3 02/09/2019  . metoprolol tartrate (LOPRESSOR) 25 MG tablet Take 0.5 tablets (12.5 mg total) by mouth 2 (two) times daily. 60 tablet 1 02/09/2019 at 1800  . midodrine (PROAMATINE) 10 MG tablet Take 1 tablet (10 mg total) by mouth 2 (two) times daily with a meal. (Patient taking differently: Take 10 mg by mouth daily as needed (for low bp from dialysis). ) 60 tablet 1 unknown  . mirtazapine (REMERON) 15 MG tablet Take 15 mg by mouth at bedtime.   6 02/09/2019  . mometasone-formoterol (DULERA) 100-5  MCG/ACT AERO Inhale 2 puffs into the lungs every morning. 13 g 2 02/09/2019  . omeprazole (PRILOSEC OTC) 20 MG tablet Take 1 tablet (20 mg total) by mouth daily. 30 tablet 1 02/09/2019  . oxyCODONE-acetaminophen (PERCOCET) 5-325 MG tablet Take 1-2 tablets by mouth every 6 (six) hours as needed for moderate pain or severe pain. 45 tablet 0 unknown  . PROAIR HFA 108 (90 Base) MCG/ACT inhaler Inhale 2 puffs into the lungs every 4 (four) hours as needed for wheezing or shortness of breath.   0 unknown  . spironolactone (ALDACTONE) 25 MG tablet Take 25 mg by mouth daily.   02/09/2019  . traMADol (ULTRAM) 50 MG tablet Take 1 tablet (50 mg total) by mouth every 6 (six) hours as needed for moderate pain or severe pain. 15 tablet 0 unknown  . insulin aspart (NOVOLOG) 100 UNIT/ML injection Inject 6 Units into the skin 3 (three) times daily with meals. 10 mL 11   . insulin detemir (LEVEMIR) 100 UNIT/ML injection Inject 0.18 mLs (18 Units total) into the skin daily. 10 mL 11   . warfarin (COUMADIN) 5 MG tablet Take 5 mg by mouth daily at 6 PM.   02/09/2019 at 1800   Scheduled:  . aspirin EC  81 mg Oral Daily  . calcium acetate  1,334 mg Oral TID WC  .  Chlorhexidine Gluconate Cloth  6 each Topical Q0600  . folic acid  1 mg Oral Daily  . insulin aspart  0-9 Units Subcutaneous TID WC  . insulin detemir  9 Units Subcutaneous Daily  . LORazepam  0-4 mg Intravenous Q6H   Followed by  . LORazepam  0-4 mg Intravenous Q12H  . metoprolol tartrate  12.5 mg Oral BID  . mometasone-formoterol  2 puff Inhalation q morning - 10a  . multivitamin with minerals  1 tablet Oral Daily  . pantoprazole  40 mg Oral Daily  . sodium bicarbonate  650 mg Oral BID  . spironolactone  25 mg Oral Daily  . thiamine  100 mg Oral Daily   Or  . thiamine  100 mg Intravenous Daily   Infusions:  . sodium chloride    . sodium chloride    . sodium chloride    . cefTRIAXone (ROCEPHIN)  IV 2 g (02/13/19 0923)   PRN: sodium chloride, sodium  chloride, acetaminophen **OR** acetaminophen, albuterol, alteplase, heparin, heparin, hydrALAZINE, LORazepam **OR** LORazepam, ondansetron **OR** ondansetron (ZOFRAN) IV Anti-infectives (From admission, onward)   Start     Dose/Rate Route Frequency Ordered Stop   02/11/19 1000  cefTRIAXone (ROCEPHIN) 2 g in sodium chloride 0.9 % 100 mL IVPB     2 g 200 mL/hr over 30 Minutes Intravenous Every 24 hours 02/11/19 0230     02/11/19 0115  piperacillin-tazobactam (ZOSYN) IVPB 3.375 g     3.375 g 100 mL/hr over 30 Minutes Intravenous  Once 02/11/19 0104 02/11/19 0212      Assessment: 58 year old male anticoagulated on warfarin for SVC thrombosis and CVA. Dr Merlene Laughter recommends switching from warfarin, Dr Manuella Ghazi agrees apixaban is best choice due to warfarin failure and ESRD. Current Hgb is 7.5 and Dr Manuella Ghazi is aware. Current INR is 2.2, will not be able to start eliquis until INR <2.   Goal of Therapy:  anticoagulation with eliquis Monitor platelets by anticoagulation protocol: Yes   Plan:  Stop Warfarin, will not start apixaban until INR < 2 Apixaban 10mg  po bid x 7 days followed by apixaban 5mg  po bid thereafter Dr Merlene Laughter recommends 2 weeks of anticoagulation at this time, therefore will put stop date on apixaban 5mg  po BID Monitor closely for signs and symptoms of bleeding, Hgb is 7.5 at this time  Donna Christen Caster [place] 02/13/2019,9:49 AM

## 2019-02-13 NOTE — Progress Notes (Signed)
*  PRELIMINARY RESULTS* Echocardiogram 2D Echocardiogram has been performed.  Steven [name] 02/13/2019, 4:24 PM

## 2019-02-13 NOTE — TOC Progression Note (Signed)
Transition of Care [facility]) - Progression Note    Patient Details  Name: ARCENIO MULLALY MRN: [id number] Date of Birth: 1961-08-17  [facility]) CM/SW Contact  Roda Shutters Margretta Sidle, RN Phone Number: 02/13/2019, 11:56 AM   Clinical Narrative:   Pt has accepted bed request at [facility] Pt has 1115 chair time, MWF at [facility] in [place]. Facility aware of DC tomorrow and they request DC summary be faxed. CM has updated sister joyce of DC plan.     Expected Discharge Plan: Skilled Nursing Facility Barriers to Discharge: SNF Pending bed offer  Expected Discharge Plan and Services Expected Discharge Plan: [place] Choice: [place] arrangements for the past 2 months: Single Family Home Expected Discharge Date: 02/15/19                      Social Determinants of Health (SDOH) Interventions Alcohol Brief Interventions/Follow-up: Alcohol Education  Readmission Risk Interventions Readmission Risk Prevention Plan 02/13/2019  Transportation Screening Complete  Medication Review [employer]) Complete  PCP or Specialist appointment within 3-5 days of discharge Complete  HRI or Home Care Consult Complete  SW Recovery Care/Counseling Consult Complete  Palliative Care Screening Not [place] Complete  Some recent data might be hidden

## 2019-02-13 NOTE — Care Management Important Message (Signed)
Important Message  Patient Details  Name: Steven [name] MRN: [id number] Date of Birth: 12/15/1960   Medicare Important Message Given:  Yes    [name] 02/13/2019, 1:45 PM
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
## 2019-02-14 LAB — CBC
HCT: 24 % — ABNORMAL LOW (ref 39.0–52.0)
Hemoglobin: 7.4 g/dL — ABNORMAL LOW (ref 13.0–17.0)
MCH: 30 pg (ref 26.0–34.0)
MCHC: 30.8 g/dL (ref 30.0–36.0)
MCV: 97.2 fL (ref 80.0–100.0)
Platelets: 124 10*3/uL — ABNORMAL LOW (ref 150–400)
RBC: 2.47 MIL/uL — ABNORMAL LOW (ref 4.22–5.81)
RDW: 16 % — ABNORMAL HIGH (ref 11.5–15.5)
WBC: 3.9 10*3/uL — ABNORMAL LOW (ref 4.0–10.5)
nRBC: 0 % (ref 0.0–0.2)

## 2019-02-14 LAB — BASIC METABOLIC PANEL
Anion gap: 12 (ref 5–15)
BUN: 12 mg/dL (ref 6–20)
CO2: 28 mmol/L (ref 22–32)
Calcium: 8.4 mg/dL — ABNORMAL LOW (ref 8.9–10.3)
Chloride: 98 mmol/L (ref 98–111)
Creatinine, Ser: 3.32 mg/dL — ABNORMAL HIGH (ref 0.61–1.24)
GFR calc Af Amer: 23 mL/min — ABNORMAL LOW (ref >60)
GFR calc non Af Amer: 19 mL/min — ABNORMAL LOW (ref >60)
Glucose, Bld: 99 mg/dL (ref 70–99)
Potassium: 3.6 mmol/L (ref 3.5–5.1)
Sodium: 138 mmol/L (ref 135–145)

## 2019-02-14 LAB — PROTIME-INR
INR: 1.9 — ABNORMAL HIGH (ref 0.8–1.2)
Prothrombin Time: 21.9 seconds — ABNORMAL HIGH (ref 11.4–15.2)

## 2019-02-14 LAB — GLUCOSE, CAPILLARY: Glucose-Capillary: 91 mg/dL (ref 70–99)

## 2019-02-14 MED ORDER — INSULIN DETEMIR 100 UNIT/ML ~~LOC~~ SOLN: 9.0000 [IU] | Freq: Every day | SUBCUTANEOUS | 11 refills | Status: DC

## 2019-02-14 MED ORDER — ASPIRIN EC 81 MG PO TBEC: 81.0000 mg | DELAYED_RELEASE_TABLET | Freq: Every day | ORAL | 0 refills | Status: AC

## 2019-02-14 MED ORDER — FOLIC ACID 1 MG PO TABS: 1.0000 mg | ORAL_TABLET | Freq: Every day | ORAL | 0 refills | Status: AC

## 2019-02-14 MED ORDER — CEFDINIR 300 MG PO CAPS: 300.0000 mg | ORAL_CAPSULE | Freq: Two times a day (BID) | ORAL | 0 refills | Status: AC

## 2019-02-14 MED ORDER — THIAMINE HCL 100 MG PO TABS: 100.0000 mg | ORAL_TABLET | Freq: Every day | ORAL | 3 refills | Status: AC

## 2019-02-14 MED ORDER — TRAMADOL HCL 50 MG PO TABS: 50.0000 mg | ORAL_TABLET | Freq: Four times a day (QID) | ORAL | 0 refills | Status: AC | PRN

## 2019-02-14 MED ORDER — OXYCODONE-ACETAMINOPHEN 5-325 MG PO TABS: 1.0000 | ORAL_TABLET | Freq: Four times a day (QID) | ORAL | 0 refills | Status: AC | PRN

## 2019-02-14 MED ORDER — APIXABAN 5 MG PO TABS: 5.0000 mg | ORAL_TABLET | Freq: Two times a day (BID) | ORAL | Status: DC

## 2019-02-14 MED ORDER — APIXABAN 5 MG PO TABS: 10.0000 mg | ORAL_TABLET | Freq: Two times a day (BID) | ORAL | Status: DC

## 2019-02-14 MED ORDER — APIXABAN 5 MG PO TABS: 5.0000 mg | ORAL_TABLET | Freq: Two times a day (BID) | ORAL | 3 refills | Status: DC

## 2019-02-14 MED ORDER — SODIUM BICARBONATE 650 MG PO TABS: 650.0000 mg | ORAL_TABLET | Freq: Two times a day (BID) | ORAL | 1 refills | Status: DC

## 2019-02-14 MED ORDER — APIXABAN 5 MG PO TABS: 10.0000 mg | ORAL_TABLET | Freq: Two times a day (BID) | ORAL | 0 refills | Status: DC

## 2019-02-14 NOTE — TOC Transition Note (Signed)
Transition of Care [facility]) - CM/SW Discharge Note   Patient Details  Name: Steven [name] MRN: [id number] Date of Birth: Mar 31, 1961  Transition [facility] Care [facility]) CM/SW Contact:  Zettie Pho, LCSW Phone Number: 02/14/2019, 9:59 AM   Clinical Narrative:   The CSW contacted [facility] to advise that patient will discharge today via non-emergent EMS and has faxed the discharge summary to [phone] as requested by the facility. The attending RN is aware and has contacted family and completed the medical necessity form for EMS. She plans to call report and will contact this CSW should needs arise. The CSW is signing off. Please consult should needs change.    Final next level of care: Skilled Nursing Facility Barriers to Discharge: No Barriers Identified   Patient Goals and CMS Choice Patient states their goals for this hospitalization and ongoing recovery are:: Short term rehab with eventual return home and to PLOF. CMS Medicare.gov Compare Post Acute Care list provided to:: Patient Choice offered to / list presented to : Patient  Discharge Placement PASRR number recieved: 02/12/19            Patient [facility] bed at: [facility]) Patient to be transferred to facility by: EMS Name of family member notified: Manuela Neptune (sister) Patient and family notified of of transfer: 02/14/19  Discharge Plan and Services     Post Acute Care Choice: [place]          DME Arranged: N/A DME Agency: NA       HH Arranged: NA HH Agency: NA        Social Determinants of Health (SDOH) Interventions Alcohol Brief Interventions/Follow-up: Alcohol Education   Readmission Risk Interventions Readmission Risk Prevention Plan 02/13/2019  Transportation Screening Complete  Medication Review Press photographer) Complete  PCP or Specialist appointment within 3-5 days of discharge Complete  HRI or Home Care Consult Complete  SW Recovery Care/Counseling Consult Complete   [place] Complete  Some recent data might be hidden

## 2019-02-14 NOTE — Progress Notes (Signed)
PT LEFT FLOOR VIA STRETCHER ACCOMPANIED BY EMS STAFF, NO EVIDENCE OF DISCOMFORT, VS STABLE.

## 2019-02-14 NOTE — Progress Notes (Signed)
REPORT CALLED TO Madelin Headings LPN AT [facility], AWAITING TRANSPORTATION BY [facility].

## 2019-02-14 NOTE — Discharge Summary (Signed)
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 SARS-CoV-2.  Clinical  correlation with patient history and other diagnostic information is  necessary to determine patient infection status.  Positive results do  not rule out bacterial infection or co-infection with other viruses. If result is PRESUMPTIVE POSTIVE SARS-CoV-2 nucleic acids MAY BE PRESENT.   A presumptive positive result was obtained on the submitted specimen  and confirmed on repeat testing.  While 2019 novel coronavirus  (SARS-CoV-2) nucleic acids may be present in the submitted sample  additional confirmatory testing may be necessary for epidemiological  and / or clinical management purposes  to differentiate between  SARS-CoV-2 and other Sarbecovirus currently known to infect humans.  If clinically indicated additional testing with an alternate test  methodology [phone]) is advised. The SARS-CoV-2 RNA is  generally  detectable in upper and lower respiratory sp ecimens during the acute  phase of infection. The expected result is Negative. Fact Sheet for Patients:  StrictlyIdeas.no Fact Sheet for Healthcare Providers: BankingDealers.co.za This test is not yet approved or cleared by the [place] FDA and has been authorized for detection and/or diagnosis of SARS-CoV-2 by FDA under an Emergency Use Authorization (EUA).  This EUA will remain in effect (meaning this test can be used) for the duration of the COVID-19 declaration under Section 564(b)(1) of the Act, 21 U.S.C. section 360bbb-3(b)(1), unless the authorization is terminated or revoked sooner. Performed at [facility], [address], [place], [place] [address]   Blood culture (routine x 2)     Status: None (Preliminary result)   Collection Time: 02/11/19  1:28 AM  Result Value Ref Range  Status   Specimen Description BLOOD LEFT HAND  Final   Special Requests   Final    BOTTLES DRAWN AEROBIC AND ANAEROBIC Blood Culture adequate volume   Culture   Final    NO GROWTH 3 DAYS Performed at [facility], [address] [place], [place] [address]    Report Status PENDING  Incomplete  Blood culture (routine x 2)     Status: None (Preliminary result)   Collection Time: 02/11/19  1:31 AM  Result Value Ref Range Status   Specimen Description BLOOD LEFT HAND  Final   Special Requests   Final    AEROBIC BOTTLE ONLY Blood Culture results may not be optimal due to an inadequate volume of blood received in culture bottles   Culture   Final    NO GROWTH 3 DAYS Performed at [facility], [address] [place], [place] [address]    Report Status PENDING  Incomplete  Gastrointestinal Panel by PCR , Stool     Status: None   Collection Time: 02/11/19 10:13 AM  Result Value Ref Range Status   Campylobacter species NOT DETECTED NOT DETECTED Final   Plesimonas shigelloides NOT DETECTED NOT  DETECTED Final   Salmonella species NOT DETECTED NOT DETECTED Final   Yersinia enterocolitica NOT DETECTED NOT DETECTED Final   Vibrio species NOT DETECTED NOT DETECTED Final   Vibrio cholerae NOT DETECTED NOT DETECTED Final   Enteroaggregative E coli (EAEC) NOT DETECTED NOT DETECTED Final   Enteropathogenic E coli (EPEC) NOT DETECTED NOT DETECTED Final   Enterotoxigenic E coli (ETEC) NOT DETECTED NOT DETECTED Final   Shiga like toxin producing E coli (STEC) NOT DETECTED NOT DETECTED Final   Shigella/Enteroinvasive E coli (EIEC) NOT DETECTED NOT DETECTED Final   Cryptosporidium NOT DETECTED NOT DETECTED Final   Cyclospora cayetanensis NOT DETECTED NOT DETECTED Final   Entamoeba histolytica NOT DETECTED NOT DETECTED Final   Giardia lamblia NOT DETECTED NOT DETECTED Final   Adenovirus F40/41 NOT DETECTED NOT DETECTED Final   Astrovirus NOT DETECTED NOT DETECTED Final   Norovirus GI/GII NOT DETECTED NOT DETECTED Final   Rotavirus A NOT DETECTED NOT DETECTED Final   Sapovirus (I, II, IV, and V) NOT DETECTED NOT DETECTED Final    Comment: Performed at [facility], [address] [place], [place] [address]  Culture, Urine     Status: None   Collection Time: 02/11/19  1:36 PM  Result Value Ref Range Status   Specimen Description   Final    URINE, RANDOM Performed at [facility], [address], [place], [place] [address]    Special Requests   Final    NONE Performed at [facility], [address] [place], [place] [address]    Culture   Final    NO GROWTH Performed at [facility], [place], [place], [place] [address]    Report Status 02/12/2019 FINAL  Final     Labs: BNP (last 3 results) Recent Labs    02/11/19 0032  BNP 185.6*   Basic Metabolic Panel: Recent Labs  Lab 02/10/19 2357 02/11/19 0315 02/12/19 0530 02/13/19 0449 02/14/19 0659  NA 125* 126* 137 137 138  K 4.4 4.6 3.4* 3.9 3.6  CL 89* 91* 98 99 98  CO2 17* 16* 27 27 28   GLUCOSE 305*  275* 55* 100* 99  BUN 73* 75* 30* 30* 12  CREATININE 7.71* 7.86* 4.57* 5.40* 3.32*  CALCIUM 8.4* 8.4* 8.2* 8.0* 8.4*  MG  --   --   --  1.9  --   PHOS  --   --  3.1 4.1  --    Liver Function Tests: Recent Labs  Lab 02/10/19 2357 02/11/19 0315 02/12/19 0530 02/13/19 0449  AST 48* 43*  --   --   ALT 34 30  --   --   ALKPHOS 232* 224*  --   --   BILITOT 0.7 0.7  --   --   PROT 6.5 6.5  --   --   ALBUMIN 2.7* 2.8* 2.5* 2.5*   No results for input(s): LIPASE, AMYLASE in the last 168 hours. Recent Labs  Lab 02/12/19 1059  AMMONIA 23   CBC: Recent Labs  Lab 02/10/19 1143 02/10/19 2357 02/11/19 0315 02/12/19 0530 02/13/19 0449 02/14/19 0659  WBC 5.8 3.9* 4.8 3.0* 3.3* 3.9*  NEUTROABS 4.3 2.7  --   --   --   --   HGB 8.2* 8.0* 8.1* 8.1* 7.5* 7.4*  HCT 26.0* 24.7* 25.3* 24.9* 24.9* 24.0*  MCV 93.9 92.5 94.4 92.9 97.6 97.2  PLT 171 169 148* 135* 123* 124*   Cardiac Enzymes: Recent Labs  Lab 02/10/19 2357  TROPONINI 0.08*   BNP: Invalid input(s): POCBNP CBG: Recent Labs  Lab 02/13/19 0726 02/13/19 1111 02/13/19 1619 02/13/19 2159 02/14/19 0758  GLUCAP 79 136* 95 106* 91   D-Dimer No results for input(s): DDIMER in the last 72 hours. Hgb A1c No results for input(s): HGBA1C in the last 72 hours. Lipid Profile Recent Labs    02/13/19 0854  CHOL 230*  HDL 34*  LDLCALC 157*  TRIG 196*  CHOLHDL 6.8   Thyroid function studies Recent Labs    02/12/19 1059  TSH 3.725   Anemia work up No results for input(s): VITAMINB12, FOLATE, FERRITIN, TIBC, IRON, RETICCTPCT in the last 72 hours. Urinalysis    Component Value Date/Time   COLORURINE RED (A) 02/11/2019 1336   APPEARANCEUR HAZY (A) 02/11/2019 1336   LABSPEC 1.010 02/11/2019 1336   PHURINE 7.0 02/11/2019 1336   GLUCOSEU 250 (A) 02/11/2019 1336   HGBUR LARGE (A) 02/11/2019 1336   BILIRUBINUR NEGATIVE 02/11/2019 1336   KETONESUR TRACE (A) 02/11/2019 1336   PROTEINUR >300 (A) 02/11/2019 1336    NITRITE POSITIVE (A) 02/11/2019 1336   LEUKOCYTESUR SMALL (A) 02/11/2019 1336   Sepsis Labs Invalid input(s): PROCALCITONIN,  WBC,  LACTICIDVEN Microbiology Recent Results (from the past 240 hour(s))  SARS Coronavirus 2 (CEPHEID - Performed in [place] [place] hospital lab), Hosp Order     Status: None   Collection Time: 02/10/19 11:59 PM  Result Value Ref Range Status   SARS Coronavirus 2 NEGATIVE NEGATIVE Final    Comment: (NOTE) If result is NEGATIVE SARS-CoV-2 target nucleic acids are NOT DETECTED. The SARS-CoV-2 RNA is generally detectable in upper and lower  respiratory specimens during the acute phase of infection. The lowest  concentration of SARS-CoV-2 viral copies this assay can detect is 250  copies / mL. A negative result does not preclude SARS-CoV-2 infection  and should not be used as the sole basis for treatment or other  patient management decisions.  A negative result may occur with  improper specimen collection / handling, submission of specimen other  than nasopharyngeal swab, presence of viral mutation(s) within the  areas targeted by this assay, and inadequate number of viral copies  (<250 copies / mL). A negative result must be combined with clinical  observations, patient history, and epidemiological information. If result is POSITIVE SARS-CoV-2 target nucleic acids  are DETECTED. The SARS-CoV-2 RNA is generally detectable in upper and lower  respiratory specimens dur ing the acute phase of infection.  Positive  results are indicative of active infection with SARS-CoV-2.  Clinical  correlation with patient history and other diagnostic information is  necessary to determine patient infection status.  Positive results do  not rule out bacterial infection or co-infection with other viruses. If result is PRESUMPTIVE POSTIVE SARS-CoV-2 nucleic acids MAY BE PRESENT.   A presumptive positive result was obtained on the submitted specimen  and confirmed on repeat testing.   While 2019 novel coronavirus  (SARS-CoV-2) nucleic acids may be present in the submitted sample  additional confirmatory testing may be necessary for epidemiological  and / or clinical management purposes  to differentiate between  SARS-CoV-2 and other Sarbecovirus currently known to infect humans.  If clinically indicated additional testing with an alternate test  methodology [phone]) is advised. The SARS-CoV-2 RNA is generally  detectable in upper and lower respiratory sp ecimens during the acute  phase of infection. The expected result is Negative. Fact Sheet for Patients:  StrictlyIdeas.no Fact Sheet for Healthcare Providers: BankingDealers.co.za This test is not yet approved or cleared by the [place] FDA and has been authorized for detection and/or diagnosis of SARS-CoV-2 by FDA under an Emergency Use Authorization (EUA).  This EUA will remain in effect (meaning this test can be used) for the duration of the COVID-19 declaration under Section 564(b)(1) of the Act, 21 U.S.C. section 360bbb-3(b)(1), unless the authorization is terminated or revoked sooner. Performed at [facility], [address], [place], [place] [address]   Blood culture (routine x 2)     Status: None (Preliminary result)   Collection Time: 02/11/19  1:28 AM  Result Value Ref Range Status   Specimen Description BLOOD LEFT HAND  Final   Special Requests   Final    BOTTLES DRAWN AEROBIC AND ANAEROBIC Blood Culture adequate volume   Culture   Final    NO GROWTH 3 DAYS Performed at [facility], [address], [place], [place] [address]    Report Status PENDING  Incomplete  Blood culture (routine x 2)     Status: None (Preliminary result)   Collection Time: 02/11/19  1:31 AM  Result Value Ref Range Status   Specimen Description BLOOD LEFT HAND  Final   Special Requests   Final    AEROBIC BOTTLE ONLY Blood Culture results may not be optimal due to an  inadequate volume of blood received in culture bottles   Culture   Final    NO GROWTH 3 DAYS Performed at [facility], [address] [place],  [address]    Report Status PENDING  Incomplete  Gastrointestinal Panel by PCR , Stool     Status: None   Collection Time: 02/11/19 10:13 AM  Result Value Ref Range Status   Campylobacter species NOT DETECTED NOT DETECTED Final   Plesimonas shigelloides NOT DETECTED NOT DETECTED Final   Salmonella species NOT DETECTED NOT DETECTED Final   Yersinia enterocolitica NOT DETECTED NOT DETECTED Final   Vibrio species NOT DETECTED NOT DETECTED Final   Vibrio cholerae NOT DETECTED NOT DETECTED Final   Enteroaggregative E coli (EAEC) NOT DETECTED NOT DETECTED Final   Enteropathogenic E coli (EPEC) NOT DETECTED NOT DETECTED Final   Enterotoxigenic E coli (ETEC) NOT DETECTED NOT DETECTED Final   Shiga like toxin producing E coli (STEC) NOT DETECTED NOT DETECTED Final   Shigella/Enteroinvasive E coli (EIEC) NOT  DETECTED NOT DETECTED Final   Cryptosporidium NOT DETECTED NOT DETECTED Final   Cyclospora cayetanensis NOT DETECTED NOT DETECTED Final   Entamoeba histolytica NOT DETECTED NOT DETECTED Final   Giardia lamblia NOT DETECTED NOT DETECTED Final   Adenovirus F40/41 NOT DETECTED NOT DETECTED Final   Astrovirus NOT DETECTED NOT DETECTED Final   Norovirus GI/GII NOT DETECTED NOT DETECTED Final   Rotavirus A NOT DETECTED NOT DETECTED Final   Sapovirus (I, II, IV, and V) NOT DETECTED NOT DETECTED Final    Comment: Performed at [facility], [address] [place], [place] [address]  Culture, Urine     Status: None   Collection Time: 02/11/19  1:36 PM  Result Value Ref Range Status   Specimen Description   Final    URINE, RANDOM Performed at [facility], [address], [place], [place] [address]    Special Requests   Final    NONE Performed at [facility], [address] [place], [place] [address]    Culture   Final    NO  GROWTH Performed at [facility], [place] [address], [place], [place] [address]    Report Status 02/12/2019 FINAL  Final     Time coordinating discharge: 35 minutes  SIGNED:   Rodena Goldmann, DO [facility] 02/14/2019, 9:09 AM  If 7PM-7AM, please contact night-coverage [email or web address] Password [email or web address]

## 2019-02-16 DIAGNOSIS — E1129 Type 2 diabetes mellitus with other diabetic kidney complication: Secondary | ICD-10-CM | POA: Diagnosis not present

## 2019-02-16 DIAGNOSIS — D631 Anemia in chronic kidney disease: Secondary | ICD-10-CM | POA: Diagnosis not present

## 2019-02-16 DIAGNOSIS — N186 End stage renal disease: Secondary | ICD-10-CM | POA: Diagnosis not present

## 2019-02-16 DIAGNOSIS — K210 Gastro-esophageal reflux disease with esophagitis: Secondary | ICD-10-CM | POA: Diagnosis not present

## 2019-02-16 DIAGNOSIS — I10 Essential (primary) hypertension: Secondary | ICD-10-CM | POA: Diagnosis not present

## 2019-02-16 DIAGNOSIS — N2581 Secondary hyperparathyroidism of renal origin: Secondary | ICD-10-CM | POA: Diagnosis not present

## 2019-02-16 DIAGNOSIS — D509 Iron deficiency anemia, unspecified: Secondary | ICD-10-CM | POA: Diagnosis not present

## 2019-02-16 DIAGNOSIS — I639 Cerebral infarction, unspecified: Secondary | ICD-10-CM | POA: Diagnosis not present

## 2019-02-16 DIAGNOSIS — Z992 Dependence on renal dialysis: Secondary | ICD-10-CM | POA: Diagnosis not present

## 2019-02-16 LAB — CULTURE, BLOOD (ROUTINE X 2)
Culture: NO GROWTH
Culture: NO GROWTH
Special Requests: ADEQUATE

## 2019-02-18 DIAGNOSIS — N186 End stage renal disease: Secondary | ICD-10-CM | POA: Diagnosis not present

## 2019-02-18 DIAGNOSIS — D509 Iron deficiency anemia, unspecified: Secondary | ICD-10-CM | POA: Diagnosis not present

## 2019-02-18 DIAGNOSIS — Z992 Dependence on renal dialysis: Secondary | ICD-10-CM | POA: Diagnosis not present

## 2019-02-18 DIAGNOSIS — N2581 Secondary hyperparathyroidism of renal origin: Secondary | ICD-10-CM | POA: Diagnosis not present

## 2019-02-18 DIAGNOSIS — D631 Anemia in chronic kidney disease: Secondary | ICD-10-CM | POA: Diagnosis not present

## 2019-02-19 ENCOUNTER — Other Ambulatory Visit (INDEPENDENT_AMBULATORY_CARE_PROVIDER_SITE_OTHER): Payer: Self-pay | Admitting: Vascular Surgery

## 2019-02-19 ENCOUNTER — Ambulatory Visit (INDEPENDENT_AMBULATORY_CARE_PROVIDER_SITE_OTHER): Payer: Medicare Other | Admitting: Vascular Surgery

## 2019-02-19 ENCOUNTER — Encounter (INDEPENDENT_AMBULATORY_CARE_PROVIDER_SITE_OTHER): Payer: Medicare Other

## 2019-02-19 DIAGNOSIS — N186 End stage renal disease: Secondary | ICD-10-CM

## 2019-02-20 DIAGNOSIS — D631 Anemia in chronic kidney disease: Secondary | ICD-10-CM | POA: Diagnosis not present

## 2019-02-20 DIAGNOSIS — D509 Iron deficiency anemia, unspecified: Secondary | ICD-10-CM | POA: Diagnosis not present

## 2019-02-20 DIAGNOSIS — N2581 Secondary hyperparathyroidism of renal origin: Secondary | ICD-10-CM | POA: Diagnosis not present

## 2019-02-20 DIAGNOSIS — N186 End stage renal disease: Secondary | ICD-10-CM | POA: Diagnosis not present

## 2019-02-20 DIAGNOSIS — Z992 Dependence on renal dialysis: Secondary | ICD-10-CM | POA: Diagnosis not present

## 2019-02-23 DIAGNOSIS — Z992 Dependence on renal dialysis: Secondary | ICD-10-CM | POA: Diagnosis not present

## 2019-02-23 DIAGNOSIS — N2581 Secondary hyperparathyroidism of renal origin: Secondary | ICD-10-CM | POA: Diagnosis not present

## 2019-02-23 DIAGNOSIS — N186 End stage renal disease: Secondary | ICD-10-CM | POA: Diagnosis not present

## 2019-02-23 DIAGNOSIS — D509 Iron deficiency anemia, unspecified: Secondary | ICD-10-CM | POA: Diagnosis not present

## 2019-02-23 DIAGNOSIS — D631 Anemia in chronic kidney disease: Secondary | ICD-10-CM | POA: Diagnosis not present

## 2019-02-25 DIAGNOSIS — D509 Iron deficiency anemia, unspecified: Secondary | ICD-10-CM | POA: Diagnosis not present

## 2019-02-25 DIAGNOSIS — Z992 Dependence on renal dialysis: Secondary | ICD-10-CM | POA: Diagnosis not present

## 2019-02-25 DIAGNOSIS — D631 Anemia in chronic kidney disease: Secondary | ICD-10-CM | POA: Diagnosis not present

## 2019-02-25 DIAGNOSIS — N186 End stage renal disease: Secondary | ICD-10-CM | POA: Diagnosis not present

## 2019-02-25 DIAGNOSIS — N2581 Secondary hyperparathyroidism of renal origin: Secondary | ICD-10-CM | POA: Diagnosis not present

## 2019-02-26 ENCOUNTER — Other Ambulatory Visit: Payer: Self-pay | Admitting: *Deleted

## 2019-02-26 NOTE — Patient Outreach (Signed)
Member assessed for potential [facility] Care Management needs as a benefit of his [employer].  Member discussed during this morning's telephonic IDT meeting with [facility] UM team and [facility] SNF staff.   Facility staff reports member is adamant in returning home at discharge. States he has some confusion at times. He has sisters but not sure how much he is allowing them to assist him.  Discussed that Probation officer will attempt to make referral to [employer] (contact information previously provided by inpatient RNCM from [facility]) for additional support.  Will plan on making [facility] [place] Management referral as well closer to SNF discharge.   Writer will continue to follow for disposition plans and progression.  Will continue to collaborate with [facility] UM team and facility.   Marthenia Rolling, MSN-Ed, RN,BSN [place] Acute Care Coordinator [phone]

## 2019-02-26 NOTE — Patient Outreach (Signed)
[facility]) Care Management  02/26/2019  Steven [name] [id number] [id number]    Telephone call made to [facility] at [phone]. Spoke with Lattie Haw to discuss the process of making a paramedicine referral on a member discharging from  SNF to home.   Lattie Haw advised Dentist to her upon SNF discharge and they will outreach to member at that time.  In the meantime, Probation officer will plan telephonic outreach to Mr. Wordell while in [place] regarding [place] Management program services and [facility] Paramedicine referral.   Please note this encounter was telephonic.   Marthenia Rolling, MSN-Ed, RN,BSN [place] Acute Care Coordinator [phone]

## 2019-02-27 DIAGNOSIS — N186 End stage renal disease: Secondary | ICD-10-CM | POA: Diagnosis not present

## 2019-02-27 DIAGNOSIS — D631 Anemia in chronic kidney disease: Secondary | ICD-10-CM | POA: Diagnosis not present

## 2019-02-27 DIAGNOSIS — N2581 Secondary hyperparathyroidism of renal origin: Secondary | ICD-10-CM | POA: Diagnosis not present

## 2019-02-27 DIAGNOSIS — Z992 Dependence on renal dialysis: Secondary | ICD-10-CM | POA: Diagnosis not present

## 2019-02-27 DIAGNOSIS — D509 Iron deficiency anemia, unspecified: Secondary | ICD-10-CM | POA: Diagnosis not present

## 2019-03-02 DIAGNOSIS — D631 Anemia in chronic kidney disease: Secondary | ICD-10-CM | POA: Diagnosis not present

## 2019-03-02 DIAGNOSIS — D509 Iron deficiency anemia, unspecified: Secondary | ICD-10-CM | POA: Diagnosis not present

## 2019-03-02 DIAGNOSIS — Z992 Dependence on renal dialysis: Secondary | ICD-10-CM | POA: Diagnosis not present

## 2019-03-02 DIAGNOSIS — N186 End stage renal disease: Secondary | ICD-10-CM | POA: Diagnosis not present

## 2019-03-02 DIAGNOSIS — N2581 Secondary hyperparathyroidism of renal origin: Secondary | ICD-10-CM | POA: Diagnosis not present

## 2019-03-04 DIAGNOSIS — N2581 Secondary hyperparathyroidism of renal origin: Secondary | ICD-10-CM | POA: Diagnosis not present

## 2019-03-04 DIAGNOSIS — D631 Anemia in chronic kidney disease: Secondary | ICD-10-CM | POA: Diagnosis not present

## 2019-03-04 DIAGNOSIS — D509 Iron deficiency anemia, unspecified: Secondary | ICD-10-CM | POA: Diagnosis not present

## 2019-03-04 DIAGNOSIS — Z992 Dependence on renal dialysis: Secondary | ICD-10-CM | POA: Diagnosis not present

## 2019-03-04 DIAGNOSIS — N186 End stage renal disease: Secondary | ICD-10-CM | POA: Diagnosis not present

## 2019-03-05 ENCOUNTER — Telehealth (INDEPENDENT_AMBULATORY_CARE_PROVIDER_SITE_OTHER): Payer: Self-pay

## 2019-03-05 NOTE — Telephone Encounter (Signed)
Patient sister Lattie Haw) called to inform that her brother had fell at dialysis on May 4th, patient had a heart attack and stroke,is very delusional, the was patient was hospitalize,but at this time the patient is in rehab.

## 2019-03-06 DIAGNOSIS — D631 Anemia in chronic kidney disease: Secondary | ICD-10-CM | POA: Diagnosis not present

## 2019-03-06 DIAGNOSIS — Z992 Dependence on renal dialysis: Secondary | ICD-10-CM | POA: Diagnosis not present

## 2019-03-06 DIAGNOSIS — N186 End stage renal disease: Secondary | ICD-10-CM | POA: Diagnosis not present

## 2019-03-06 DIAGNOSIS — D509 Iron deficiency anemia, unspecified: Secondary | ICD-10-CM | POA: Diagnosis not present

## 2019-03-06 DIAGNOSIS — N2581 Secondary hyperparathyroidism of renal origin: Secondary | ICD-10-CM | POA: Diagnosis not present

## 2019-03-08 DIAGNOSIS — Z992 Dependence on renal dialysis: Secondary | ICD-10-CM | POA: Diagnosis not present

## 2019-03-08 DIAGNOSIS — N186 End stage renal disease: Secondary | ICD-10-CM | POA: Diagnosis not present

## 2019-03-09 DIAGNOSIS — N2581 Secondary hyperparathyroidism of renal origin: Secondary | ICD-10-CM | POA: Diagnosis not present

## 2019-03-09 DIAGNOSIS — D631 Anemia in chronic kidney disease: Secondary | ICD-10-CM | POA: Diagnosis not present

## 2019-03-09 DIAGNOSIS — Z992 Dependence on renal dialysis: Secondary | ICD-10-CM | POA: Diagnosis not present

## 2019-03-09 DIAGNOSIS — N186 End stage renal disease: Secondary | ICD-10-CM | POA: Diagnosis not present

## 2019-03-09 DIAGNOSIS — D509 Iron deficiency anemia, unspecified: Secondary | ICD-10-CM | POA: Diagnosis not present

## 2019-03-11 ENCOUNTER — Telehealth: Payer: Self-pay | Admitting: Neurology

## 2019-03-11 DIAGNOSIS — Z992 Dependence on renal dialysis: Secondary | ICD-10-CM | POA: Diagnosis not present

## 2019-03-11 DIAGNOSIS — D631 Anemia in chronic kidney disease: Secondary | ICD-10-CM | POA: Diagnosis not present

## 2019-03-11 DIAGNOSIS — D509 Iron deficiency anemia, unspecified: Secondary | ICD-10-CM | POA: Diagnosis not present

## 2019-03-11 DIAGNOSIS — N186 End stage renal disease: Secondary | ICD-10-CM | POA: Diagnosis not present

## 2019-03-11 DIAGNOSIS — N2581 Secondary hyperparathyroidism of renal origin: Secondary | ICD-10-CM | POA: Diagnosis not present

## 2019-03-11 NOTE — Telephone Encounter (Signed)
°  Due to current COVID 19 pandemic, our office is severely reducing in office visits until further notice, in order to minimize the risk to our patients and healthcare providers.    Called patient and scheduled a virtual visit with Dr. Krista Blue  for 03/26/2019 . Patient verbalized understanding of the [email or web address] process and I have sent an e-mail to [email or web address] with link and instructions as well as my name and our office number. Patient understands that they will receive a call from RN to update chart.   Pt understands that although there may be some limitations with this type of visit, we will take all precautions to reduce any security or privacy concerns.  Pt understands that this will be treated like an in office visit and we will file with pt's insurance, and there may be a patient responsible charge related to this service.

## 2019-03-13 DIAGNOSIS — Z992 Dependence on renal dialysis: Secondary | ICD-10-CM | POA: Diagnosis not present

## 2019-03-13 DIAGNOSIS — D631 Anemia in chronic kidney disease: Secondary | ICD-10-CM | POA: Diagnosis not present

## 2019-03-13 DIAGNOSIS — N2581 Secondary hyperparathyroidism of renal origin: Secondary | ICD-10-CM | POA: Diagnosis not present

## 2019-03-13 DIAGNOSIS — N186 End stage renal disease: Secondary | ICD-10-CM | POA: Diagnosis not present

## 2019-03-13 DIAGNOSIS — D509 Iron deficiency anemia, unspecified: Secondary | ICD-10-CM | POA: Diagnosis not present

## 2019-03-16 DIAGNOSIS — D509 Iron deficiency anemia, unspecified: Secondary | ICD-10-CM | POA: Diagnosis not present

## 2019-03-16 DIAGNOSIS — N2581 Secondary hyperparathyroidism of renal origin: Secondary | ICD-10-CM | POA: Diagnosis not present

## 2019-03-16 DIAGNOSIS — Z992 Dependence on renal dialysis: Secondary | ICD-10-CM | POA: Diagnosis not present

## 2019-03-16 DIAGNOSIS — N186 End stage renal disease: Secondary | ICD-10-CM | POA: Diagnosis not present

## 2019-03-16 DIAGNOSIS — D631 Anemia in chronic kidney disease: Secondary | ICD-10-CM | POA: Diagnosis not present

## 2019-03-18 DIAGNOSIS — D509 Iron deficiency anemia, unspecified: Secondary | ICD-10-CM | POA: Diagnosis not present

## 2019-03-18 DIAGNOSIS — N186 End stage renal disease: Secondary | ICD-10-CM | POA: Diagnosis not present

## 2019-03-18 DIAGNOSIS — D631 Anemia in chronic kidney disease: Secondary | ICD-10-CM | POA: Diagnosis not present

## 2019-03-18 DIAGNOSIS — N2581 Secondary hyperparathyroidism of renal origin: Secondary | ICD-10-CM | POA: Diagnosis not present

## 2019-03-18 DIAGNOSIS — Z992 Dependence on renal dialysis: Secondary | ICD-10-CM | POA: Diagnosis not present

## 2019-03-19 DIAGNOSIS — I10 Essential (primary) hypertension: Secondary | ICD-10-CM | POA: Diagnosis not present

## 2019-03-19 DIAGNOSIS — Z452 Encounter for adjustment and management of vascular access device: Secondary | ICD-10-CM | POA: Diagnosis not present

## 2019-03-19 DIAGNOSIS — J45909 Unspecified asthma, uncomplicated: Secondary | ICD-10-CM | POA: Diagnosis not present

## 2019-03-19 DIAGNOSIS — R918 Other nonspecific abnormal finding of lung field: Secondary | ICD-10-CM | POA: Diagnosis not present

## 2019-03-19 DIAGNOSIS — G8192 Hemiplegia, unspecified affecting left dominant side: Secondary | ICD-10-CM | POA: Diagnosis not present

## 2019-03-20 DIAGNOSIS — N2581 Secondary hyperparathyroidism of renal origin: Secondary | ICD-10-CM | POA: Diagnosis not present

## 2019-03-20 DIAGNOSIS — D509 Iron deficiency anemia, unspecified: Secondary | ICD-10-CM | POA: Diagnosis not present

## 2019-03-20 DIAGNOSIS — N186 End stage renal disease: Secondary | ICD-10-CM | POA: Diagnosis not present

## 2019-03-20 DIAGNOSIS — Z992 Dependence on renal dialysis: Secondary | ICD-10-CM | POA: Diagnosis not present

## 2019-03-20 DIAGNOSIS — D631 Anemia in chronic kidney disease: Secondary | ICD-10-CM | POA: Diagnosis not present

## 2019-03-23 DIAGNOSIS — N186 End stage renal disease: Secondary | ICD-10-CM | POA: Diagnosis not present

## 2019-03-23 DIAGNOSIS — N2581 Secondary hyperparathyroidism of renal origin: Secondary | ICD-10-CM | POA: Diagnosis not present

## 2019-03-23 DIAGNOSIS — Z992 Dependence on renal dialysis: Secondary | ICD-10-CM | POA: Diagnosis not present

## 2019-03-23 DIAGNOSIS — D631 Anemia in chronic kidney disease: Secondary | ICD-10-CM | POA: Diagnosis not present

## 2019-03-23 DIAGNOSIS — D509 Iron deficiency anemia, unspecified: Secondary | ICD-10-CM | POA: Diagnosis not present

## 2019-03-24 ENCOUNTER — Encounter: Payer: Self-pay | Admitting: Neurology

## 2019-03-24 NOTE — Telephone Encounter (Signed)
Chart updated from referral. Referral did not include MAR. I have requested this be faxed to [phone].

## 2019-03-25 DIAGNOSIS — N186 End stage renal disease: Secondary | ICD-10-CM | POA: Diagnosis not present

## 2019-03-25 DIAGNOSIS — N2581 Secondary hyperparathyroidism of renal origin: Secondary | ICD-10-CM | POA: Diagnosis not present

## 2019-03-25 DIAGNOSIS — D509 Iron deficiency anemia, unspecified: Secondary | ICD-10-CM | POA: Diagnosis not present

## 2019-03-25 DIAGNOSIS — I639 Cerebral infarction, unspecified: Secondary | ICD-10-CM | POA: Diagnosis not present

## 2019-03-25 DIAGNOSIS — Z992 Dependence on renal dialysis: Secondary | ICD-10-CM | POA: Diagnosis not present

## 2019-03-25 DIAGNOSIS — D631 Anemia in chronic kidney disease: Secondary | ICD-10-CM | POA: Diagnosis not present

## 2019-03-25 DIAGNOSIS — E1129 Type 2 diabetes mellitus with other diabetic kidney complication: Secondary | ICD-10-CM | POA: Diagnosis not present

## 2019-03-25 DIAGNOSIS — K210 Gastro-esophageal reflux disease with esophagitis: Secondary | ICD-10-CM | POA: Diagnosis not present

## 2019-03-25 DIAGNOSIS — I10 Essential (primary) hypertension: Secondary | ICD-10-CM | POA: Diagnosis not present

## 2019-03-26 ENCOUNTER — Other Ambulatory Visit: Payer: Self-pay | Admitting: *Deleted

## 2019-03-26 ENCOUNTER — Ambulatory Visit (INDEPENDENT_AMBULATORY_CARE_PROVIDER_SITE_OTHER): Payer: Medicare Other | Admitting: Neurology

## 2019-03-26 ENCOUNTER — Telehealth: Payer: Self-pay | Admitting: Neurology

## 2019-03-26 ENCOUNTER — Encounter: Payer: Self-pay | Admitting: Neurology

## 2019-03-26 ENCOUNTER — Other Ambulatory Visit: Payer: Self-pay

## 2019-03-26 DIAGNOSIS — G459 Transient cerebral ischemic attack, unspecified: Secondary | ICD-10-CM

## 2019-03-26 DIAGNOSIS — R410 Disorientation, unspecified: Secondary | ICD-10-CM | POA: Insufficient documentation

## 2019-03-26 DIAGNOSIS — R269 Unspecified abnormalities of gait and mobility: Secondary | ICD-10-CM | POA: Insufficient documentation

## 2019-03-26 DIAGNOSIS — R531 Weakness: Secondary | ICD-10-CM | POA: Diagnosis not present
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
## 2019-03-26 NOTE — Patient Outreach (Signed)
Member remains at [facility] receiving rehab therapy.  Member assessed for potential [facility] Care Management needs as a benefit of his [employer].  Mr. Miller discussed in telephonic IDT meeting today with [facility] UM team and facility staff.   The likely transition plan will be for long term care placement. Has upcoming medical appointments. Facility reports member now has Medicaid.   Will sign off once disposition facility is confirmed. .  Will continue to follow and collaborate with [facility] UM team and facility staff.    Marthenia Rolling, MSN-Ed, RN,BSN [place] Acute Care Coordinator [phone]

## 2019-03-26 NOTE — Telephone Encounter (Signed)
[employer]care/medicaid order sent to [facility]. No auth they will reach out to the patient to schedule.

## 2019-03-26 NOTE — Telephone Encounter (Signed)
I could not find the order for MRA brain any more.please help

## 2019-03-27 DIAGNOSIS — N2581 Secondary hyperparathyroidism of renal origin: Secondary | ICD-10-CM | POA: Diagnosis not present

## 2019-03-27 DIAGNOSIS — D631 Anemia in chronic kidney disease: Secondary | ICD-10-CM | POA: Diagnosis not present

## 2019-03-27 DIAGNOSIS — N186 End stage renal disease: Secondary | ICD-10-CM | POA: Diagnosis not present

## 2019-03-27 DIAGNOSIS — D509 Iron deficiency anemia, unspecified: Secondary | ICD-10-CM | POA: Diagnosis not present

## 2019-03-27 DIAGNOSIS — Z992 Dependence on renal dialysis: Secondary | ICD-10-CM | POA: Diagnosis not present

## 2019-03-30 DIAGNOSIS — N186 End stage renal disease: Secondary | ICD-10-CM | POA: Diagnosis not present

## 2019-03-30 DIAGNOSIS — D509 Iron deficiency anemia, unspecified: Secondary | ICD-10-CM | POA: Diagnosis not present

## 2019-03-30 DIAGNOSIS — Z992 Dependence on renal dialysis: Secondary | ICD-10-CM | POA: Diagnosis not present

## 2019-03-30 DIAGNOSIS — N2581 Secondary hyperparathyroidism of renal origin: Secondary | ICD-10-CM | POA: Diagnosis not present

## 2019-03-30 DIAGNOSIS — D631 Anemia in chronic kidney disease: Secondary | ICD-10-CM | POA: Diagnosis not present

## 2019-03-30 NOTE — Telephone Encounter (Signed)
I only see the MRI Brain wo contrast order on 03/26/19

## 2019-03-31 ENCOUNTER — Other Ambulatory Visit (HOSPITAL_COMMUNITY): Payer: Self-pay | Admitting: Pulmonary Disease

## 2019-03-31 DIAGNOSIS — R918 Other nonspecific abnormal finding of lung field: Secondary | ICD-10-CM

## 2019-03-31 NOTE — Addendum Note (Signed)
Addended by: Marcial Pacas on: 03/31/2019 09:00 AM   Modules accepted: Orders

## 2019-03-31 NOTE — Telephone Encounter (Signed)
Noted  

## 2019-03-31 NOTE — Telephone Encounter (Signed)
I have put in order for MRA and MRI of brain. Previously study on May 7th had significant motion artifact, there was suspicious of right MCA stroke, right M1 large vessel occlusion, repeat study to clarify diagnosis of stroke

## 2019-04-01 ENCOUNTER — Telehealth: Payer: Self-pay | Admitting: Neurology

## 2019-04-01 DIAGNOSIS — D509 Iron deficiency anemia, unspecified: Secondary | ICD-10-CM | POA: Diagnosis not present

## 2019-04-01 DIAGNOSIS — N186 End stage renal disease: Secondary | ICD-10-CM | POA: Diagnosis not present

## 2019-04-01 DIAGNOSIS — Z992 Dependence on renal dialysis: Secondary | ICD-10-CM | POA: Diagnosis not present

## 2019-04-01 DIAGNOSIS — N2581 Secondary hyperparathyroidism of renal origin: Secondary | ICD-10-CM | POA: Diagnosis not present

## 2019-04-01 DIAGNOSIS — D631 Anemia in chronic kidney disease: Secondary | ICD-10-CM | POA: Diagnosis not present

## 2019-04-01 NOTE — Telephone Encounter (Signed)
Left message requesting a call back.  Provided our office hours and phone number.

## 2019-04-01 NOTE — Telephone Encounter (Signed)
Pt would like RN to call him back to clear up some questions about his CAT Scan results. Please advise.

## 2019-04-02 ENCOUNTER — Other Ambulatory Visit: Payer: Self-pay | Admitting: Pulmonary Disease

## 2019-04-02 ENCOUNTER — Other Ambulatory Visit (HOSPITAL_COMMUNITY): Payer: Self-pay | Admitting: Pulmonary Disease

## 2019-04-02 DIAGNOSIS — R918 Other nonspecific abnormal finding of lung field: Secondary | ICD-10-CM

## 2019-04-02 NOTE — Telephone Encounter (Signed)
Left second message requesting a return call.

## 2019-04-02 NOTE — Telephone Encounter (Signed)
I spoke to Steven [name].  He wanted the number to [place] so he could schedule his MRI brain and MRA head.  He was provided with this information.  He has both tests scheduled on 04/25/2019.

## 2019-04-04 DIAGNOSIS — D509 Iron deficiency anemia, unspecified: Secondary | ICD-10-CM | POA: Diagnosis not present

## 2019-04-04 DIAGNOSIS — N186 End stage renal disease: Secondary | ICD-10-CM | POA: Diagnosis not present

## 2019-04-04 DIAGNOSIS — N2581 Secondary hyperparathyroidism of renal origin: Secondary | ICD-10-CM | POA: Diagnosis not present

## 2019-04-04 DIAGNOSIS — D631 Anemia in chronic kidney disease: Secondary | ICD-10-CM | POA: Diagnosis not present

## 2019-04-04 DIAGNOSIS — Z992 Dependence on renal dialysis: Secondary | ICD-10-CM | POA: Diagnosis not present

## 2019-04-06 DIAGNOSIS — N186 End stage renal disease: Secondary | ICD-10-CM | POA: Diagnosis not present

## 2019-04-06 DIAGNOSIS — D509 Iron deficiency anemia, unspecified: Secondary | ICD-10-CM | POA: Diagnosis not present

## 2019-04-06 DIAGNOSIS — D631 Anemia in chronic kidney disease: Secondary | ICD-10-CM | POA: Diagnosis not present

## 2019-04-06 DIAGNOSIS — Z992 Dependence on renal dialysis: Secondary | ICD-10-CM | POA: Diagnosis not present

## 2019-04-06 DIAGNOSIS — N2581 Secondary hyperparathyroidism of renal origin: Secondary | ICD-10-CM | POA: Diagnosis not present

## 2019-04-07 ENCOUNTER — Other Ambulatory Visit: Payer: Self-pay

## 2019-04-07 ENCOUNTER — Ambulatory Visit (HOSPITAL_COMMUNITY)
Admission: RE | Admit: 2019-04-07 | Discharge: 2019-04-07 | Disposition: A | Discharge status: Home / Self Care | Payer: No Typology Code available for payment source | Source: Ambulatory Visit | Attending: Pulmonary Disease | Admitting: Pulmonary Disease

## 2019-04-07 DIAGNOSIS — J9811 Atelectasis: Secondary | ICD-10-CM | POA: Diagnosis not present

## 2019-04-07 DIAGNOSIS — J90 Pleural effusion, not elsewhere classified: Secondary | ICD-10-CM | POA: Insufficient documentation

## 2019-04-07 DIAGNOSIS — Z992 Dependence on renal dialysis: Secondary | ICD-10-CM | POA: Diagnosis not present

## 2019-04-07 DIAGNOSIS — R918 Other nonspecific abnormal finding of lung field: Secondary | ICD-10-CM | POA: Insufficient documentation

## 2019-04-07 DIAGNOSIS — N186 End stage renal disease: Secondary | ICD-10-CM | POA: Diagnosis not present

## 2019-04-07 LAB — GLUCOSE, CAPILLARY: Glucose-Capillary: 251 mg/dL — ABNORMAL HIGH (ref 70–99)

## 2019-04-07 MED ORDER — FLUDEOXYGLUCOSE F - 18 (FDG) INJECTION
13.5900 | Freq: Once | INTRAVENOUS | Status: AC | PRN
  Administered 2019-04-07: 14:00:00 13.59 via INTRAVENOUS

## 2019-04-08 DIAGNOSIS — Z992 Dependence on renal dialysis: Secondary | ICD-10-CM | POA: Diagnosis not present

## 2019-04-08 DIAGNOSIS — D631 Anemia in chronic kidney disease: Secondary | ICD-10-CM | POA: Diagnosis not present

## 2019-04-08 DIAGNOSIS — D509 Iron deficiency anemia, unspecified: Secondary | ICD-10-CM | POA: Diagnosis not present

## 2019-04-08 DIAGNOSIS — N186 End stage renal disease: Secondary | ICD-10-CM | POA: Diagnosis not present

## 2019-04-08 DIAGNOSIS — N2581 Secondary hyperparathyroidism of renal origin: Secondary | ICD-10-CM | POA: Diagnosis not present

## 2019-04-09 ENCOUNTER — Other Ambulatory Visit (HOSPITAL_COMMUNITY): Payer: Self-pay | Admitting: Pulmonary Disease

## 2019-04-09 DIAGNOSIS — J90 Pleural effusion, not elsewhere classified: Secondary | ICD-10-CM

## 2019-04-10 DIAGNOSIS — D509 Iron deficiency anemia, unspecified: Secondary | ICD-10-CM | POA: Diagnosis not present

## 2019-04-10 DIAGNOSIS — D631 Anemia in chronic kidney disease: Secondary | ICD-10-CM | POA: Diagnosis not present

## 2019-04-10 DIAGNOSIS — N2581 Secondary hyperparathyroidism of renal origin: Secondary | ICD-10-CM | POA: Diagnosis not present

## 2019-04-10 DIAGNOSIS — N186 End stage renal disease: Secondary | ICD-10-CM | POA: Diagnosis not present

## 2019-04-10 DIAGNOSIS — Z992 Dependence on renal dialysis: Secondary | ICD-10-CM | POA: Diagnosis not present

## 2019-04-13 DIAGNOSIS — Z992 Dependence on renal dialysis: Secondary | ICD-10-CM | POA: Diagnosis not present

## 2019-04-13 DIAGNOSIS — D509 Iron deficiency anemia, unspecified: Secondary | ICD-10-CM | POA: Diagnosis not present

## 2019-04-13 DIAGNOSIS — N2581 Secondary hyperparathyroidism of renal origin: Secondary | ICD-10-CM | POA: Diagnosis not present

## 2019-04-13 DIAGNOSIS — N186 End stage renal disease: Secondary | ICD-10-CM | POA: Diagnosis not present

## 2019-04-13 DIAGNOSIS — D631 Anemia in chronic kidney disease: Secondary | ICD-10-CM | POA: Diagnosis not present

## 2019-04-15 DIAGNOSIS — D509 Iron deficiency anemia, unspecified: Secondary | ICD-10-CM | POA: Diagnosis not present

## 2019-04-15 DIAGNOSIS — N186 End stage renal disease: Secondary | ICD-10-CM | POA: Diagnosis not present

## 2019-04-15 DIAGNOSIS — Z992 Dependence on renal dialysis: Secondary | ICD-10-CM | POA: Diagnosis not present

## 2019-04-15 DIAGNOSIS — N2581 Secondary hyperparathyroidism of renal origin: Secondary | ICD-10-CM | POA: Diagnosis not present

## 2019-04-15 DIAGNOSIS — D631 Anemia in chronic kidney disease: Secondary | ICD-10-CM | POA: Diagnosis not present

## 2019-04-17 DIAGNOSIS — D631 Anemia in chronic kidney disease: Secondary | ICD-10-CM | POA: Diagnosis not present

## 2019-04-17 DIAGNOSIS — D509 Iron deficiency anemia, unspecified: Secondary | ICD-10-CM | POA: Diagnosis not present

## 2019-04-17 DIAGNOSIS — N186 End stage renal disease: Secondary | ICD-10-CM | POA: Diagnosis not present

## 2019-04-17 DIAGNOSIS — Z992 Dependence on renal dialysis: Secondary | ICD-10-CM | POA: Diagnosis not present

## 2019-04-17 DIAGNOSIS — N2581 Secondary hyperparathyroidism of renal origin: Secondary | ICD-10-CM | POA: Diagnosis not present

## 2019-04-20 DIAGNOSIS — E119 Type 2 diabetes mellitus without complications: Secondary | ICD-10-CM | POA: Diagnosis not present

## 2019-04-20 DIAGNOSIS — Z794 Long term (current) use of insulin: Secondary | ICD-10-CM | POA: Diagnosis not present

## 2019-04-20 DIAGNOSIS — D509 Iron deficiency anemia, unspecified: Secondary | ICD-10-CM | POA: Diagnosis not present

## 2019-04-20 DIAGNOSIS — Z992 Dependence on renal dialysis: Secondary | ICD-10-CM | POA: Diagnosis not present

## 2019-04-20 DIAGNOSIS — N186 End stage renal disease: Secondary | ICD-10-CM | POA: Diagnosis not present

## 2019-04-20 DIAGNOSIS — D631 Anemia in chronic kidney disease: Secondary | ICD-10-CM | POA: Diagnosis not present

## 2019-04-20 DIAGNOSIS — N2581 Secondary hyperparathyroidism of renal origin: Secondary | ICD-10-CM | POA: Diagnosis not present

## 2019-04-22 DIAGNOSIS — Z992 Dependence on renal dialysis: Secondary | ICD-10-CM | POA: Diagnosis not present

## 2019-04-22 DIAGNOSIS — N2581 Secondary hyperparathyroidism of renal origin: Secondary | ICD-10-CM | POA: Diagnosis not present

## 2019-04-22 DIAGNOSIS — D631 Anemia in chronic kidney disease: Secondary | ICD-10-CM | POA: Diagnosis not present

## 2019-04-22 DIAGNOSIS — N186 End stage renal disease: Secondary | ICD-10-CM | POA: Diagnosis not present

## 2019-04-22 DIAGNOSIS — D509 Iron deficiency anemia, unspecified: Secondary | ICD-10-CM | POA: Diagnosis not present

## 2019-04-23 ENCOUNTER — Other Ambulatory Visit: Payer: Self-pay

## 2019-04-23 ENCOUNTER — Ambulatory Visit (INDEPENDENT_AMBULATORY_CARE_PROVIDER_SITE_OTHER): Payer: Medicare Other | Admitting: Neurology

## 2019-04-23 ENCOUNTER — Other Ambulatory Visit: Payer: Self-pay | Admitting: *Deleted

## 2019-04-23 DIAGNOSIS — R410 Disorientation, unspecified: Secondary | ICD-10-CM

## 2019-04-23 DIAGNOSIS — R269 Unspecified abnormalities of gait and mobility: Secondary | ICD-10-CM

## 2019-04-23 DIAGNOSIS — R531 Weakness: Secondary | ICD-10-CM

## 2019-04-23 NOTE — Patient Outreach (Signed)
Member assessed for potential [facility] Care Management needs as a benefit of [place] [employer].  Confirmed with [facility] UM RN that Steven [name] has transitioned to long term care at [place] to sign off. No identifiable [facility] Care Management needs at this time.  Steven Rolling, MSN-Ed, RN,BSN [place] Acute Care Coordinator [phone] [facility]) [phone]  (Toll free office)

## 2019-04-24 DIAGNOSIS — E1129 Type 2 diabetes mellitus with other diabetic kidney complication: Secondary | ICD-10-CM | POA: Diagnosis not present

## 2019-04-24 DIAGNOSIS — I639 Cerebral infarction, unspecified: Secondary | ICD-10-CM | POA: Diagnosis not present

## 2019-04-24 DIAGNOSIS — N2581 Secondary hyperparathyroidism of renal origin: Secondary | ICD-10-CM | POA: Diagnosis not present

## 2019-04-24 DIAGNOSIS — D631 Anemia in chronic kidney disease: Secondary | ICD-10-CM | POA: Diagnosis not present

## 2019-04-24 DIAGNOSIS — D509 Iron deficiency anemia, unspecified: Secondary | ICD-10-CM | POA: Diagnosis not present

## 2019-04-24 DIAGNOSIS — K210 Gastro-esophageal reflux disease with esophagitis: Secondary | ICD-10-CM | POA: Diagnosis not present

## 2019-04-24 DIAGNOSIS — N186 End stage renal disease: Secondary | ICD-10-CM | POA: Diagnosis not present

## 2019-04-24 DIAGNOSIS — Z992 Dependence on renal dialysis: Secondary | ICD-10-CM | POA: Diagnosis not present

## 2019-04-24 DIAGNOSIS — I10 Essential (primary) hypertension: Secondary | ICD-10-CM | POA: Diagnosis not present

## 2019-04-25 ENCOUNTER — Ambulatory Visit
Admission: RE | Admit: 2019-04-25 | Discharge: 2019-04-25 | Disposition: A | Discharge status: Home / Self Care | Payer: Medicare Other | Source: Ambulatory Visit | Attending: Neurology | Admitting: Neurology

## 2019-04-25 ENCOUNTER — Other Ambulatory Visit: Payer: Self-pay

## 2019-04-25 DIAGNOSIS — R531 Weakness: Secondary | ICD-10-CM

## 2019-04-25 DIAGNOSIS — G459 Transient cerebral ischemic attack, unspecified: Secondary | ICD-10-CM | POA: Diagnosis not present

## 2019-04-25 DIAGNOSIS — R410 Disorientation, unspecified: Secondary | ICD-10-CM

## 2019-04-25 DIAGNOSIS — R269 Unspecified abnormalities of gait and mobility: Secondary | ICD-10-CM

## 2019-04-27 ENCOUNTER — Other Ambulatory Visit (HOSPITAL_COMMUNITY): Payer: Medicare Other

## 2019-04-27 ENCOUNTER — Telehealth: Payer: Self-pay | Admitting: Neurology

## 2019-04-27 ENCOUNTER — Encounter (HOSPITAL_COMMUNITY): Payer: Self-pay

## 2019-04-27 DIAGNOSIS — Z992 Dependence on renal dialysis: Secondary | ICD-10-CM | POA: Diagnosis not present

## 2019-04-27 DIAGNOSIS — N186 End stage renal disease: Secondary | ICD-10-CM | POA: Diagnosis not present

## 2019-04-27 DIAGNOSIS — D509 Iron deficiency anemia, unspecified: Secondary | ICD-10-CM | POA: Diagnosis not present

## 2019-04-27 DIAGNOSIS — N2581 Secondary hyperparathyroidism of renal origin: Secondary | ICD-10-CM | POA: Diagnosis not present

## 2019-04-27 DIAGNOSIS — D631 Anemia in chronic kidney disease: Secondary | ICD-10-CM | POA: Diagnosis not present

## 2019-04-27 NOTE — Telephone Encounter (Signed)
Please call patient, MRI of the brain showed evidence of right subcortical corona radiata infarction, also small vessel disease  MRI of the brain showed mild intracranial atherosclerotic disease, there was no significant large vessel disease.  Continue current medications   IMPRESSION: Abnormal MRI scan of the brain showing indeterminate age right subcortical corona radiata infarcts which are expected evolutionary changes compared with previous MRI from 02/12/2019.  There are mild changes of chronic microvascular ischemia.  No acute abnormalities are noted.  The right vertebral artery flow void is hypoplastic  IMPRESSION: Abnormal MRA of the brain showing mild focal narrowing in the proximal portion of the posterior division of the right middle cerebral artery.  No other areas of significant narrowing in the large and medium size intracranial vessels.

## 2019-04-27 NOTE — Telephone Encounter (Signed)
I was able to speak with the patient.  He verbalized understanding of his result and was in agreement to continue his current medications.

## 2019-04-27 NOTE — Telephone Encounter (Signed)
i have [facility] wanting to know if you can fax results from [place] because he is trying to leave

## 2019-04-28 ENCOUNTER — Telehealth: Payer: Self-pay | Admitting: *Deleted

## 2019-04-28 NOTE — Telephone Encounter (Signed)
Pt call want eeg results. Please call [phone]

## 2019-04-28 NOTE — Procedures (Signed)
   HISTORY: 58 year old male, presented with episode of confusion, generalized weakness  TECHNIQUE:  This is a routine 16 channel EEG recording with one channel devoted to a limited EKG recording.  It was performed during wakefulness, drowsiness and asleep.  Hyperventilation and photic stimulation were performed as activating procedures.  There are minimum muscle and movement artifact noted.  Upon maximum arousal, posterior dominant waking rhythm consistent of mildly dysrhythmic theta range activity, with frequency of 7Hz . Activities are symmetric over the bilateral posterior derivations and attenuated with eye opening.  Hyperventilation produced mild/moderate buildup with higher amplitude and the slower activities noted.  Photic stimulation did not alter the tracing.  During EEG recording, patient developed drowsiness and no deeper stage of sleep was achieved  During EEG recording, there was no epileptiform discharge noted.  EKG demonstrate sinus rhythm, with heart rate of 84 bpm  CONCLUSION: This is a mild abnormal study, there is evidence of mild generalized slowing, indicating bilateral hemisphere malfunction, common etiology are metabolic toxic.  Marcial Pacas, M.D. Ph.D.  [facility] [place], [place] [address] Phone: [phone] Fax:      [phone]

## 2019-04-29 DIAGNOSIS — D509 Iron deficiency anemia, unspecified: Secondary | ICD-10-CM | POA: Diagnosis not present

## 2019-04-29 DIAGNOSIS — N186 End stage renal disease: Secondary | ICD-10-CM | POA: Diagnosis not present

## 2019-04-29 DIAGNOSIS — Z992 Dependence on renal dialysis: Secondary | ICD-10-CM | POA: Diagnosis not present

## 2019-04-29 DIAGNOSIS — D631 Anemia in chronic kidney disease: Secondary | ICD-10-CM | POA: Diagnosis not present

## 2019-04-29 DIAGNOSIS — N2581 Secondary hyperparathyroidism of renal origin: Secondary | ICD-10-CM | POA: Diagnosis not present

## 2019-04-29 NOTE — Telephone Encounter (Signed)
Please call patient, EEG showed mildly slowing, there is no evidence of seizure activity   CONCLUSION: This is a mild abnormal study, there is evidence of mild generalized slowing, indicating bilateral hemisphere malfunction, common etiology are metabolic toxic.

## 2019-04-29 NOTE — Telephone Encounter (Signed)
I have spoken to the patient and he verbalized understanding of his EEG results.

## 2019-05-01 DIAGNOSIS — Z992 Dependence on renal dialysis: Secondary | ICD-10-CM | POA: Diagnosis not present

## 2019-05-01 DIAGNOSIS — N2581 Secondary hyperparathyroidism of renal origin: Secondary | ICD-10-CM | POA: Diagnosis not present

## 2019-05-01 DIAGNOSIS — D509 Iron deficiency anemia, unspecified: Secondary | ICD-10-CM | POA: Diagnosis not present

## 2019-05-01 DIAGNOSIS — D631 Anemia in chronic kidney disease: Secondary | ICD-10-CM | POA: Diagnosis not present

## 2019-05-01 DIAGNOSIS — N186 End stage renal disease: Secondary | ICD-10-CM | POA: Diagnosis not present

## 2019-05-04 DIAGNOSIS — Z992 Dependence on renal dialysis: Secondary | ICD-10-CM | POA: Diagnosis not present

## 2019-05-04 DIAGNOSIS — N2581 Secondary hyperparathyroidism of renal origin: Secondary | ICD-10-CM | POA: Diagnosis not present

## 2019-05-04 DIAGNOSIS — D631 Anemia in chronic kidney disease: Secondary | ICD-10-CM | POA: Diagnosis not present

## 2019-05-04 DIAGNOSIS — D509 Iron deficiency anemia, unspecified: Secondary | ICD-10-CM | POA: Diagnosis not present

## 2019-05-04 DIAGNOSIS — N186 End stage renal disease: Secondary | ICD-10-CM | POA: Diagnosis not present

## 2019-05-06 DIAGNOSIS — N2581 Secondary hyperparathyroidism of renal origin: Secondary | ICD-10-CM | POA: Diagnosis not present

## 2019-05-06 DIAGNOSIS — Z992 Dependence on renal dialysis: Secondary | ICD-10-CM | POA: Diagnosis not present

## 2019-05-06 DIAGNOSIS — D509 Iron deficiency anemia, unspecified: Secondary | ICD-10-CM | POA: Diagnosis not present

## 2019-05-06 DIAGNOSIS — D631 Anemia in chronic kidney disease: Secondary | ICD-10-CM | POA: Diagnosis not present

## 2019-05-06 DIAGNOSIS — N186 End stage renal disease: Secondary | ICD-10-CM | POA: Diagnosis not present

## 2019-05-08 DIAGNOSIS — N186 End stage renal disease: Secondary | ICD-10-CM | POA: Diagnosis not present

## 2019-05-08 DIAGNOSIS — N2581 Secondary hyperparathyroidism of renal origin: Secondary | ICD-10-CM | POA: Diagnosis not present

## 2019-05-08 DIAGNOSIS — D631 Anemia in chronic kidney disease: Secondary | ICD-10-CM | POA: Diagnosis not present

## 2019-05-08 DIAGNOSIS — Z992 Dependence on renal dialysis: Secondary | ICD-10-CM | POA: Diagnosis not present

## 2019-05-08 DIAGNOSIS — D509 Iron deficiency anemia, unspecified: Secondary | ICD-10-CM | POA: Diagnosis not present

## 2019-05-09 DIAGNOSIS — D631 Anemia in chronic kidney disease: Secondary | ICD-10-CM | POA: Diagnosis not present

## 2019-05-09 DIAGNOSIS — N2581 Secondary hyperparathyroidism of renal origin: Secondary | ICD-10-CM | POA: Diagnosis not present

## 2019-05-09 DIAGNOSIS — N186 End stage renal disease: Secondary | ICD-10-CM | POA: Diagnosis not present

## 2019-05-09 DIAGNOSIS — D509 Iron deficiency anemia, unspecified: Secondary | ICD-10-CM | POA: Diagnosis not present

## 2019-05-09 DIAGNOSIS — Z992 Dependence on renal dialysis: Secondary | ICD-10-CM | POA: Diagnosis not present

## 2019-05-11 DIAGNOSIS — N186 End stage renal disease: Secondary | ICD-10-CM | POA: Diagnosis not present

## 2019-05-11 DIAGNOSIS — N2581 Secondary hyperparathyroidism of renal origin: Secondary | ICD-10-CM | POA: Diagnosis not present

## 2019-05-11 DIAGNOSIS — Z992 Dependence on renal dialysis: Secondary | ICD-10-CM | POA: Diagnosis not present

## 2019-05-11 DIAGNOSIS — D509 Iron deficiency anemia, unspecified: Secondary | ICD-10-CM | POA: Diagnosis not present

## 2019-05-11 DIAGNOSIS — D631 Anemia in chronic kidney disease: Secondary | ICD-10-CM | POA: Diagnosis not present

## 2019-05-13 ENCOUNTER — Other Ambulatory Visit (HOSPITAL_COMMUNITY)
Admission: RE | Admit: 2019-05-13 | Discharge: 2019-05-13 | Disposition: A | Discharge status: Home / Self Care | Payer: Medicare Other | Source: Ambulatory Visit | Attending: Nephrology | Admitting: Nephrology

## 2019-05-13 DIAGNOSIS — N186 End stage renal disease: Secondary | ICD-10-CM | POA: Diagnosis not present

## 2019-05-13 DIAGNOSIS — D631 Anemia in chronic kidney disease: Secondary | ICD-10-CM | POA: Diagnosis not present

## 2019-05-13 DIAGNOSIS — Z7901 Long term (current) use of anticoagulants: Secondary | ICD-10-CM | POA: Diagnosis not present

## 2019-05-13 DIAGNOSIS — D509 Iron deficiency anemia, unspecified: Secondary | ICD-10-CM | POA: Diagnosis not present

## 2019-05-13 DIAGNOSIS — N2581 Secondary hyperparathyroidism of renal origin: Secondary | ICD-10-CM | POA: Diagnosis not present

## 2019-05-13 DIAGNOSIS — Z992 Dependence on renal dialysis: Secondary | ICD-10-CM | POA: Diagnosis not present

## 2019-05-13 LAB — PROTIME-INR
INR: 2.2 — ABNORMAL HIGH (ref 0.8–1.2)
Prothrombin Time: 24 seconds — ABNORMAL HIGH (ref 11.4–15.2)

## 2019-05-15 DIAGNOSIS — N2581 Secondary hyperparathyroidism of renal origin: Secondary | ICD-10-CM | POA: Diagnosis not present

## 2019-05-15 DIAGNOSIS — Z992 Dependence on renal dialysis: Secondary | ICD-10-CM | POA: Diagnosis not present

## 2019-05-15 DIAGNOSIS — N186 End stage renal disease: Secondary | ICD-10-CM | POA: Diagnosis not present

## 2019-05-15 DIAGNOSIS — D509 Iron deficiency anemia, unspecified: Secondary | ICD-10-CM | POA: Diagnosis not present

## 2019-05-15 DIAGNOSIS — D631 Anemia in chronic kidney disease: Secondary | ICD-10-CM | POA: Diagnosis not present

## 2019-05-19 DIAGNOSIS — D631 Anemia in chronic kidney disease: Secondary | ICD-10-CM | POA: Diagnosis not present

## 2019-05-19 DIAGNOSIS — D509 Iron deficiency anemia, unspecified: Secondary | ICD-10-CM | POA: Diagnosis not present

## 2019-05-19 DIAGNOSIS — Z992 Dependence on renal dialysis: Secondary | ICD-10-CM | POA: Diagnosis not present

## 2019-05-19 DIAGNOSIS — N2581 Secondary hyperparathyroidism of renal origin: Secondary | ICD-10-CM | POA: Diagnosis not present

## 2019-05-19 DIAGNOSIS — N186 End stage renal disease: Secondary | ICD-10-CM | POA: Diagnosis not present

## 2019-05-21 ENCOUNTER — Ambulatory Visit (INDEPENDENT_AMBULATORY_CARE_PROVIDER_SITE_OTHER): Payer: Medicare Other | Admitting: Family Medicine

## 2019-05-21 ENCOUNTER — Other Ambulatory Visit: Payer: Self-pay

## 2019-05-21 VITALS — BP 147/85 | HR 95 | Temp 97.3°F | Ht 71.0 in | Wt 229.2 lb

## 2019-05-21 DIAGNOSIS — I10 Essential (primary) hypertension: Secondary | ICD-10-CM

## 2019-05-21 DIAGNOSIS — R269 Unspecified abnormalities of gait and mobility: Secondary | ICD-10-CM | POA: Diagnosis not present

## 2019-05-21 DIAGNOSIS — R531 Weakness: Secondary | ICD-10-CM

## 2019-05-21 DIAGNOSIS — N186 End stage renal disease: Secondary | ICD-10-CM | POA: Diagnosis not present

## 2019-05-21 DIAGNOSIS — N2581 Secondary hyperparathyroidism of renal origin: Secondary | ICD-10-CM | POA: Diagnosis not present

## 2019-05-21 DIAGNOSIS — D509 Iron deficiency anemia, unspecified: Secondary | ICD-10-CM | POA: Diagnosis not present

## 2019-05-21 DIAGNOSIS — Z992 Dependence on renal dialysis: Secondary | ICD-10-CM

## 2019-05-21 DIAGNOSIS — E1169 Type 2 diabetes mellitus with other specified complication: Secondary | ICD-10-CM

## 2019-05-21 DIAGNOSIS — D631 Anemia in chronic kidney disease: Secondary | ICD-10-CM | POA: Diagnosis not present

## 2019-05-21 DIAGNOSIS — E669 Obesity, unspecified: Secondary | ICD-10-CM | POA: Diagnosis not present

## 2019-05-21 DIAGNOSIS — J449 Chronic obstructive pulmonary disease, unspecified: Secondary | ICD-10-CM | POA: Diagnosis not present

## 2019-05-21 DIAGNOSIS — R918 Other nonspecific abnormal finding of lung field: Secondary | ICD-10-CM | POA: Diagnosis not present

## 2019-05-21 NOTE — Progress Notes (Signed)
New Patient Office Visit  Subjective:  Patient ID: Steven [name], male    DOB: [birth year]  Age: 58 y.o. MRN: [id number]  CC:  Chief Complaint  Patient presents with  . New Patient (Initial Visit)  . Diabetes    type 1    HPI [name] presents for diagnosed DM-diagnosed 58yo-oral medication taken with good control. Pt states insulin started 4-5 year ago started taking Novolog with meals.  Pt took 12 units /meal. Within the last year levemir added due to   Nephrology-[facility]-Dr. Orville Govern times w/i the last year Dialysis at [facility] in [place] -weekly evaluation by Dr. Candiss Norse while at Dialysis-4-6 months Phoslo, lasix, aldactone, sodium bicarb-pt driving to dialysis. Diarrhea since starting dialysis-3x week  GERD-omeprazole 20mg   COPD-albuterol-used prn-tob use -age 38yo-58yo 2-3 pk/day. Pt states heavy smoker during those years-no tob use since 58yo . Pt using Dulera.  No admission for breathing concerns  Neurology-apixban, no asa  HTN-Lopressor 25mg   1/2 tablet BID-ecg 5/20, echo-EF  60-65%,   carotid u/s IMPRESSION: Color duplex indicates minimal heterogeneous plaque, with no hemodynamically significant stenosis by duplex criteria in the extracranial cerebrovascular circulation  CVA-overall weakness -PT helped -walker to assist with ambulation. No stairs-ramp into home.    Past Medical History:  Diagnosis Date  . 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 Alcohol abuse   . Anemia   . Asthma   . Cellulitis and abscess of right lower extremity 03/01/2017  . CHF (congestive heart failure) ([place])   . Chronic kidney disease    dialysis m,w,f  . Cognitive communication deficit   . COPD (chronic obstructive pulmonary disease) ([place])   . Coronary artery disease   . Diabetes mellitus without complication ([place])   . Edema extremities 03/01/2017   bilateral swelling  . GERD (gastroesophageal reflux disease)   . High  cholesterol   . High triglycerides   . Hyperlipemia   . Hypertension   . Hypothyroidism   . Major depressive disorder   . Muscle weakness   . Neuropathy   . OSA (obstructive sleep apnea)   . Other abnormalities of gait and mobility   . Peripheral vascular disease ([place])   . Pneumonia   . Sleep apnea    can't use cpap d/t feelings of suffocation    Past Surgical History:  Procedure Laterality Date  . A/V FISTULAGRAM Right 08/27/2017   Procedure: A/V FISTULAGRAM;  Surgeon: Katha Cabal, MD;  Location: [place] [facility];  Service: Cardiovascular;  Laterality: Right;  . A/V FISTULAGRAM Right 11/04/2018   Procedure: A/V FISTULAGRAM;  Surgeon: Katha Cabal, MD;  Location: [place] [facility];  Service: Cardiovascular;  Laterality: Right;  . A/V SHUNT INTERVENTION N/A 06/23/2018   Procedure: A/V SHUNT INTERVENTION;  Surgeon: Algernon Huxley, MD;  Location: [place] [facility];  Service: Cardiovascular;  Laterality: N/A;  . APPENDECTOMY  2010  . AV FISTULA PLACEMENT Right 06/14/2017   Procedure: ARTERIOVENOUS (AV) FISTULA CREATION WRIST;  Surgeon: Katha Cabal, MD;  Location: [facility];  Service: Vascular;  Laterality: Right;  . DIALYSIS/PERMA CATHETER INSERTION N/A 03/05/2017   Procedure: Dialysis/Perma Catheter Insertion;  Surgeon: Katha Cabal, MD;  Location: [place] [facility];  Service: Cardiovascular;  Laterality: N/A;  . DIALYSIS/PERMA CATHETER INSERTION N/A 09/20/2017   Procedure: DIALYSIS/PERMA CATHETER INSERTION;  Surgeon: Katha Cabal, MD;  Location: [place] [facility];  Service: Cardiovascular;  Laterality: N/A;  . DIALYSIS/PERMA CATHETER  INSERTION N/A 01/26/2019   Procedure: DIALYSIS/PERMA CATHETER INSERTION/Exchange;  Surgeon: Algernon Huxley, MD;  Location: [place] [facility];  Service: Cardiovascular;  Laterality: N/A;  . DIALYSIS/PERMA CATHETER INSERTION N/A 01/27/2019   Procedure: DIALYSIS/PERMA CATHETER INSERTION;  Surgeon: Katha Cabal,  MD;  Location: [place] [facility];  Service: Cardiovascular;  Laterality: N/A;  . EXCHANGE OF A DIALYSIS CATHETER  03/29/2017   Procedure: Exchange Of A Dialysis Catheter;  Surgeon: Katha Cabal, MD;  Location: [place] [facility];  Service: Cardiovascular;;  . IRRIGATION AND DEBRIDEMENT FOOT Right 03/01/2017   Procedure: IRRIGATION AND DEBRIDEMENT FOOT;  Surgeon: Albertine Patricia, DPM;  Location: [facility];  Service: Podiatry;  Laterality: Right;  application of wound vac  . PERIPHERAL VASCULAR THROMBECTOMY Right 06/18/2018   Procedure: PERIPHERAL VASCULAR THROMBECTOMY;  Surgeon: Algernon Huxley, MD;  Location: [place] [facility];  Service: Cardiovascular;  Laterality: Right;  Marland Kitchen VASCULAR ACCESS DEVICE INSERTION Right 01/30/2019   Procedure: INSERTION OF HERO VASCULAR ACCESS DEVICE;  Surgeon: Katha Cabal, MD;  Location: [facility];  Service: Vascular;  Laterality: Right;    Family History  Problem Relation Age of Onset  . CAD Father   . Heart disease Father   father-DM-died due to fracture related to a fall  Social History  Lives alone-sister in [place], neighbor checks on pt, 2 beers since coming home from rehab-3-4 weeks ago. Pt goes on line to order groceries, daughter 15yo Socioeconomic History  . Marital status: Divorced    Spouse name: Not on file  . Number of children: 1  . Years of education: Not on file  . Highest education level: Not on file  Occupational History  . Occupation: disability  Social Needs  . Financial resource strain: Not very hard  . Food insecurity    Worry: Never true    Inability: Never true  . Transportation needs    Medical: No    Non-medical: No  Tobacco Use  . Smoking status: Former Research scientist (life sciences)  . Smokeless tobacco: Current User    Types: Snuff  Substance and Sexual Activity  . Alcohol use: Yes    Comment: up until 01/2017 was heavy drinker...4 40 oz qd     twice a week drinker  . Drug use: No  . Sexual activity: Never  Lifestyle  . Physical  activity    Days per week: 0 days    Minutes per session: 0 min  . Stress: Not at all  Relationships  . Social connections    Talks on phone: More than three times a week    Gets together: More than three times a week    Attends religious service: Never    Active member of club or organization: No    Attends meetings of clubs or organizations: Never    Relationship status: Divorced  . Intimate partner violence    Fear of current or ex partner: No    Emotionally abused: No    Physically abused: No    Forced sexual activity: No  Other Topics Concern  . Not on file  Social History Narrative  . Not on file    ROS Review of Systems  Constitutional: Positive for fatigue. Negative for fever.  HENT: Negative for congestion and sore throat.   Eyes: Negative for redness.  Respiratory: Negative for cough, chest tightness and shortness of breath.   Cardiovascular: Negative for leg swelling.  Gastrointestinal: Positive for diarrhea. Negative for nausea and vomiting.  Genitourinary: Negative  for dysuria and urgency.  Musculoskeletal: Positive for gait problem. Negative for back pain and neck pain.  Neurological: Positive for weakness. Negative for dizziness, seizures, light-headedness and headaches.  Psychiatric/Behavioral: Positive for sleep disturbance. The patient is nervous/anxious.     Objective:   Today's Vitals: BP (!) 147/85 (BP Location: Left Arm, Patient Position: Sitting, Cuff Size: Normal)   Pulse 95   Temp (!) 97.3 F (36.3 C) (Oral)   Ht 5\' 11"  (1.803 m)   Wt 229 lb 3.2 oz (104 kg)   SpO2 96%   BMI 31.97 kg/m   Physical Exam Constitutional:      Appearance: Normal appearance.  HENT:     Head: Normocephalic and atraumatic.     Right Ear: Tympanic membrane, ear canal and external ear normal.     Left Ear: Tympanic membrane, ear canal and external ear normal.     Nose: Nose normal.     Mouth/Throat:     Mouth: Mucous membranes are moist.  Eyes:     Pupils:  Pupils are equal, round, and reactive to light.  Neck:     Musculoskeletal: Normal range of motion and neck supple.  Cardiovascular:     Rate and Rhythm: Normal rate and regular rhythm.     Pulses: Normal pulses.     Heart sounds: Normal heart sounds.  Pulmonary:     Effort: Pulmonary effort is normal.     Breath sounds: Normal breath sounds.  Musculoskeletal: Normal range of motion.  Skin:    Coloration: Skin is pale.  Neurological:     Mental Status: He is alert.     Assessment & Plan:    Outpatient Encounter Medications as of 05/21/2019  Medication Sig  . apixaban (ELIQUIS) 5 MG TABS tablet Take 2 tablets (10 mg total) by mouth 2 (two) times daily for 7 days.  Marland Kitchen apixaban (ELIQUIS) 5 MG TABS tablet Take 1 tablet (5 mg total) by mouth 2 (two) times daily for 30 days.  . calcium acetate (PHOSLO) 667 MG capsule Take 2 capsules (1,334 mg total) by mouth 3 (three) times daily with meals.  . furosemide (LASIX) 80 MG tablet Take 80 mg by mouth daily.  . insulin aspart (NOVOLOG) 100 UNIT/ML injection Inject 6 Units into the skin 3 (three) times daily with meals.  . insulin detemir (LEVEMIR) 100 UNIT/ML injection Inject 0.09 mLs (9 Units total) into the skin daily.  . metoprolol tartrate (LOPRESSOR) 25 MG tablet Take 0.5 tablets (12.5 mg total) by mouth 2 (two) times daily.  . midodrine (PROAMATINE) 10 MG tablet Take 1 tablet (10 mg total) by mouth 2 (two) times daily with a meal. (Patient taking differently: Take 10 mg by mouth daily as needed (for low bp from dialysis). )  . mometasone-formoterol (DULERA) 100-5 MCG/ACT AERO Inhale 2 puffs into the lungs every morning.  Marland Kitchen omeprazole (PRILOSEC OTC) 20 MG tablet Take 1 tablet (20 mg total) by mouth daily.  Marland Kitchen PROAIR HFA 108 (90 Base) MCG/ACT inhaler Inhale 2 puffs into the lungs every 4 (four) hours as needed for wheezing or shortness of breath.   . sodium bicarbonate 650 MG tablet Take 1 tablet (650 mg total) by mouth 2 (two) times daily.   Marland Kitchen spironolactone (ALDACTONE) 25 MG tablet Take 25 mg by mouth daily.   No facility-administered encounter medications on file as of 05/21/2019.    1. Diabetes mellitus type 2 in obese ([place]) Foot exam -swelling LE bilat-foot surgery-ulcer 2 years ago-well healed Needs  ophthalmology - Ambulatory referral to Endocrinology - Ambulatory referral to Ophthalmology Removed statin while hospitalized-LDL -157, TC-230  2. Lung mass CT WITH contrast recommended-concern for renal function and contrast - Ambulatory referral to Oncology-low risk lung mass per PET scan in 6/20-with f/u recommended.  3. Gait abnormality PT in rehab-in own home currently-one level, no concerns Handles needed for safety in bathroom  4. Generalized weakness S/p CVA-PT while in rehab  5. End stage renal disease ([place]) Dialysis with nephro following  6. Anemia due to chronic kidney disease, on chronic dialysis ([place]) CBC-H/H-7.4/24 Follow-up:   8. Essential hypertension Metoprolol-well controlled bp at home-pt takes at home 120/80  Hartley Urton Hannah Beat, MD

## 2019-05-21 NOTE — Patient Instructions (Addendum)
appt with endocrine-[facility] for eval/treatment Use Dulera twice a day with albuterol(albuterol used prior to the [facility] to open lungs for steroid inhaler) Oncology referral for lung mass-concern for repeat CT with contrast due to renal function Eye specialist exam  Keep appointments with neurology and nephrology as recommended

## 2019-05-25 DIAGNOSIS — D509 Iron deficiency anemia, unspecified: Secondary | ICD-10-CM | POA: Diagnosis not present

## 2019-05-25 DIAGNOSIS — N2581 Secondary hyperparathyroidism of renal origin: Secondary | ICD-10-CM | POA: Diagnosis not present

## 2019-05-25 DIAGNOSIS — Z992 Dependence on renal dialysis: Secondary | ICD-10-CM | POA: Diagnosis not present

## 2019-05-25 DIAGNOSIS — D631 Anemia in chronic kidney disease: Secondary | ICD-10-CM | POA: Diagnosis not present

## 2019-05-25 DIAGNOSIS — N186 End stage renal disease: Secondary | ICD-10-CM | POA: Diagnosis not present

## 2019-05-27 DIAGNOSIS — Z992 Dependence on renal dialysis: Secondary | ICD-10-CM | POA: Diagnosis not present

## 2019-05-27 DIAGNOSIS — D631 Anemia in chronic kidney disease: Secondary | ICD-10-CM | POA: Diagnosis not present

## 2019-05-27 DIAGNOSIS — D509 Iron deficiency anemia, unspecified: Secondary | ICD-10-CM | POA: Diagnosis not present

## 2019-05-27 DIAGNOSIS — N2581 Secondary hyperparathyroidism of renal origin: Secondary | ICD-10-CM | POA: Diagnosis not present

## 2019-05-27 DIAGNOSIS — N186 End stage renal disease: Secondary | ICD-10-CM | POA: Diagnosis not present

## 2019-05-28 ENCOUNTER — Ambulatory Visit (HOSPITAL_COMMUNITY): Payer: Medicare Other

## 2019-05-28 ENCOUNTER — Encounter (HOSPITAL_COMMUNITY): Payer: Self-pay

## 2019-05-29 DIAGNOSIS — D509 Iron deficiency anemia, unspecified: Secondary | ICD-10-CM | POA: Diagnosis not present

## 2019-05-29 DIAGNOSIS — N186 End stage renal disease: Secondary | ICD-10-CM | POA: Diagnosis not present

## 2019-05-29 DIAGNOSIS — Z992 Dependence on renal dialysis: Secondary | ICD-10-CM | POA: Diagnosis not present

## 2019-05-29 DIAGNOSIS — N2581 Secondary hyperparathyroidism of renal origin: Secondary | ICD-10-CM | POA: Diagnosis not present

## 2019-05-29 DIAGNOSIS — D631 Anemia in chronic kidney disease: Secondary | ICD-10-CM | POA: Diagnosis not present

## 2019-06-01 DIAGNOSIS — Z992 Dependence on renal dialysis: Secondary | ICD-10-CM | POA: Diagnosis not present

## 2019-06-01 DIAGNOSIS — N2581 Secondary hyperparathyroidism of renal origin: Secondary | ICD-10-CM | POA: Diagnosis not present

## 2019-06-01 DIAGNOSIS — D509 Iron deficiency anemia, unspecified: Secondary | ICD-10-CM | POA: Diagnosis not present

## 2019-06-01 DIAGNOSIS — D631 Anemia in chronic kidney disease: Secondary | ICD-10-CM | POA: Diagnosis not present

## 2019-06-01 DIAGNOSIS — N186 End stage renal disease: Secondary | ICD-10-CM | POA: Diagnosis not present

## 2019-06-02 ENCOUNTER — Inpatient Hospital Stay (HOSPITAL_COMMUNITY): Payer: Medicare Other | Admitting: Hematology

## 2019-06-03 DIAGNOSIS — D631 Anemia in chronic kidney disease: Secondary | ICD-10-CM | POA: Diagnosis not present

## 2019-06-03 DIAGNOSIS — N2581 Secondary hyperparathyroidism of renal origin: Secondary | ICD-10-CM | POA: Diagnosis not present

## 2019-06-03 DIAGNOSIS — D509 Iron deficiency anemia, unspecified: Secondary | ICD-10-CM | POA: Diagnosis not present

## 2019-06-03 DIAGNOSIS — N186 End stage renal disease: Secondary | ICD-10-CM | POA: Diagnosis not present

## 2019-06-03 DIAGNOSIS — Z992 Dependence on renal dialysis: Secondary | ICD-10-CM | POA: Diagnosis not present

## 2019-06-05 DIAGNOSIS — N2581 Secondary hyperparathyroidism of renal origin: Secondary | ICD-10-CM | POA: Diagnosis not present

## 2019-06-05 DIAGNOSIS — D631 Anemia in chronic kidney disease: Secondary | ICD-10-CM | POA: Diagnosis not present

## 2019-06-05 DIAGNOSIS — D509 Iron deficiency anemia, unspecified: Secondary | ICD-10-CM | POA: Diagnosis not present

## 2019-06-05 DIAGNOSIS — Z992 Dependence on renal dialysis: Secondary | ICD-10-CM | POA: Diagnosis not present

## 2019-06-05 DIAGNOSIS — N186 End stage renal disease: Secondary | ICD-10-CM | POA: Diagnosis not present

## 2019-06-08 DIAGNOSIS — N186 End stage renal disease: Secondary | ICD-10-CM | POA: Diagnosis not present

## 2019-06-08 DIAGNOSIS — Z992 Dependence on renal dialysis: Secondary | ICD-10-CM | POA: Diagnosis not present

## 2019-06-08 DIAGNOSIS — D509 Iron deficiency anemia, unspecified: Secondary | ICD-10-CM | POA: Diagnosis not present

## 2019-06-08 DIAGNOSIS — N2581 Secondary hyperparathyroidism of renal origin: Secondary | ICD-10-CM | POA: Diagnosis not present

## 2019-06-08 DIAGNOSIS — D631 Anemia in chronic kidney disease: Secondary | ICD-10-CM | POA: Diagnosis not present

## 2019-06-09 DIAGNOSIS — N2581 Secondary hyperparathyroidism of renal origin: Secondary | ICD-10-CM | POA: Diagnosis not present

## 2019-06-09 DIAGNOSIS — D509 Iron deficiency anemia, unspecified: Secondary | ICD-10-CM | POA: Diagnosis not present

## 2019-06-09 DIAGNOSIS — D631 Anemia in chronic kidney disease: Secondary | ICD-10-CM | POA: Diagnosis not present

## 2019-06-09 DIAGNOSIS — N186 End stage renal disease: Secondary | ICD-10-CM | POA: Diagnosis not present

## 2019-06-09 DIAGNOSIS — Z992 Dependence on renal dialysis: Secondary | ICD-10-CM | POA: Diagnosis not present

## 2019-06-10 ENCOUNTER — Encounter (HOSPITAL_COMMUNITY): Payer: Self-pay

## 2019-06-11 ENCOUNTER — Ambulatory Visit (HOSPITAL_COMMUNITY): Payer: Medicare Other | Admitting: Hematology

## 2019-06-12 DIAGNOSIS — Z992 Dependence on renal dialysis: Secondary | ICD-10-CM | POA: Diagnosis not present

## 2019-06-12 DIAGNOSIS — N2581 Secondary hyperparathyroidism of renal origin: Secondary | ICD-10-CM | POA: Diagnosis not present

## 2019-06-12 DIAGNOSIS — N186 End stage renal disease: Secondary | ICD-10-CM | POA: Diagnosis not present

## 2019-06-12 DIAGNOSIS — D509 Iron deficiency anemia, unspecified: Secondary | ICD-10-CM | POA: Diagnosis not present

## 2019-06-12 DIAGNOSIS — D631 Anemia in chronic kidney disease: Secondary | ICD-10-CM | POA: Diagnosis not present

## 2019-06-16 ENCOUNTER — Emergency Department (HOSPITAL_COMMUNITY): Payer: Medicare Other

## 2019-06-16 ENCOUNTER — Inpatient Hospital Stay (HOSPITAL_COMMUNITY)
Admission: EM | Admit: 2019-06-16 | Discharge: 2019-06-27 | DRG: 492 | Disposition: A | Discharge status: Skilled Nursing Facility | Payer: Medicare Other | Attending: Internal Medicine | Admitting: Internal Medicine

## 2019-06-16 ENCOUNTER — Other Ambulatory Visit: Payer: Self-pay

## 2019-06-16 ENCOUNTER — Encounter (HOSPITAL_COMMUNITY): Payer: Self-pay | Admitting: Emergency Medicine

## 2019-06-16 DIAGNOSIS — I132 Hypertensive heart and chronic kidney disease with heart failure and with stage 5 chronic kidney disease, or end stage renal disease: Secondary | ICD-10-CM | POA: Diagnosis not present

## 2019-06-16 DIAGNOSIS — S82831P Other fracture of upper and lower end of right fibula, subsequent encounter for closed fracture with malunion: Secondary | ICD-10-CM | POA: Diagnosis not present

## 2019-06-16 DIAGNOSIS — J449 Chronic obstructive pulmonary disease, unspecified: Secondary | ICD-10-CM | POA: Diagnosis present

## 2019-06-16 DIAGNOSIS — Z03818 Encounter for observation for suspected exposure to other biological agents ruled out: Secondary | ICD-10-CM | POA: Diagnosis not present

## 2019-06-16 DIAGNOSIS — E1151 Type 2 diabetes mellitus with diabetic peripheral angiopathy without gangrene: Secondary | ICD-10-CM | POA: Diagnosis present

## 2019-06-16 DIAGNOSIS — S82391D Other fracture of lower end of right tibia, subsequent encounter for closed fracture with routine healing: Secondary | ICD-10-CM | POA: Diagnosis not present

## 2019-06-16 DIAGNOSIS — I509 Heart failure, unspecified: Secondary | ICD-10-CM | POA: Diagnosis present

## 2019-06-16 DIAGNOSIS — E8889 Other specified metabolic disorders: Secondary | ICD-10-CM | POA: Diagnosis present

## 2019-06-16 DIAGNOSIS — S82871K Displaced pilon fracture of right tibia, subsequent encounter for closed fracture with nonunion: Secondary | ICD-10-CM | POA: Diagnosis not present

## 2019-06-16 DIAGNOSIS — R0902 Hypoxemia: Secondary | ICD-10-CM | POA: Diagnosis not present

## 2019-06-16 DIAGNOSIS — S82301D Unspecified fracture of lower end of right tibia, subsequent encounter for closed fracture with routine healing: Secondary | ICD-10-CM | POA: Diagnosis not present

## 2019-06-16 DIAGNOSIS — N186 End stage renal disease: Secondary | ICD-10-CM | POA: Diagnosis present

## 2019-06-16 DIAGNOSIS — S82871A Displaced pilon fracture of right tibia, initial encounter for closed fracture: Principal | ICD-10-CM | POA: Diagnosis present

## 2019-06-16 DIAGNOSIS — E1169 Type 2 diabetes mellitus with other specified complication: Secondary | ICD-10-CM | POA: Diagnosis not present

## 2019-06-16 DIAGNOSIS — S93439A Sprain of tibiofibular ligament of unspecified ankle, initial encounter: Secondary | ICD-10-CM | POA: Diagnosis present

## 2019-06-16 DIAGNOSIS — F329 Major depressive disorder, single episode, unspecified: Secondary | ICD-10-CM | POA: Diagnosis present

## 2019-06-16 DIAGNOSIS — R410 Disorientation, unspecified: Secondary | ICD-10-CM | POA: Diagnosis not present

## 2019-06-16 DIAGNOSIS — D689 Coagulation defect, unspecified: Secondary | ICD-10-CM | POA: Diagnosis not present

## 2019-06-16 DIAGNOSIS — Z419 Encounter for procedure for purposes other than remedying health state, unspecified: Secondary | ICD-10-CM

## 2019-06-16 DIAGNOSIS — W1839XA Other fall on same level, initial encounter: Secondary | ICD-10-CM | POA: Diagnosis present

## 2019-06-16 DIAGNOSIS — S89191A Other physeal fracture of lower end of right tibia, initial encounter for closed fracture: Secondary | ICD-10-CM | POA: Diagnosis not present

## 2019-06-16 DIAGNOSIS — E161 Other hypoglycemia: Secondary | ICD-10-CM | POA: Diagnosis not present

## 2019-06-16 DIAGNOSIS — Z86711 Personal history of pulmonary embolism: Secondary | ICD-10-CM

## 2019-06-16 DIAGNOSIS — Z7401 Bed confinement status: Secondary | ICD-10-CM | POA: Diagnosis not present

## 2019-06-16 DIAGNOSIS — R41841 Cognitive communication deficit: Secondary | ICD-10-CM | POA: Diagnosis present

## 2019-06-16 DIAGNOSIS — X501XXA Overexertion from prolonged static or awkward postures, initial encounter: Secondary | ICD-10-CM

## 2019-06-16 DIAGNOSIS — Z794 Long term (current) use of insulin: Secondary | ICD-10-CM

## 2019-06-16 DIAGNOSIS — E1142 Type 2 diabetes mellitus with diabetic polyneuropathy: Secondary | ICD-10-CM | POA: Diagnosis present

## 2019-06-16 DIAGNOSIS — E559 Vitamin D deficiency, unspecified: Secondary | ICD-10-CM | POA: Diagnosis present

## 2019-06-16 DIAGNOSIS — E118 Type 2 diabetes mellitus with unspecified complications: Secondary | ICD-10-CM | POA: Diagnosis not present

## 2019-06-16 DIAGNOSIS — S0990XA Unspecified injury of head, initial encounter: Secondary | ICD-10-CM | POA: Diagnosis not present

## 2019-06-16 DIAGNOSIS — E162 Hypoglycemia, unspecified: Secondary | ICD-10-CM | POA: Diagnosis not present

## 2019-06-16 DIAGNOSIS — G4733 Obstructive sleep apnea (adult) (pediatric): Secondary | ICD-10-CM | POA: Diagnosis present

## 2019-06-16 DIAGNOSIS — E1122 Type 2 diabetes mellitus with diabetic chronic kidney disease: Secondary | ICD-10-CM | POA: Diagnosis not present

## 2019-06-16 DIAGNOSIS — E785 Hyperlipidemia, unspecified: Secondary | ICD-10-CM | POA: Diagnosis not present

## 2019-06-16 DIAGNOSIS — S82891A Other fracture of right lower leg, initial encounter for closed fracture: Secondary | ICD-10-CM | POA: Diagnosis not present

## 2019-06-16 DIAGNOSIS — S82231A Displaced oblique fracture of shaft of right tibia, initial encounter for closed fracture: Secondary | ICD-10-CM | POA: Diagnosis not present

## 2019-06-16 DIAGNOSIS — E669 Obesity, unspecified: Secondary | ICD-10-CM | POA: Diagnosis not present

## 2019-06-16 DIAGNOSIS — Z20828 Contact with and (suspected) exposure to other viral communicable diseases: Secondary | ICD-10-CM | POA: Diagnosis not present

## 2019-06-16 DIAGNOSIS — S82831D Other fracture of upper and lower end of right fibula, subsequent encounter for closed fracture with routine healing: Secondary | ICD-10-CM | POA: Diagnosis not present

## 2019-06-16 DIAGNOSIS — D62 Acute posthemorrhagic anemia: Secondary | ICD-10-CM | POA: Diagnosis not present

## 2019-06-16 DIAGNOSIS — S82899A Other fracture of unspecified lower leg, initial encounter for closed fracture: Secondary | ICD-10-CM

## 2019-06-16 DIAGNOSIS — K219 Gastro-esophageal reflux disease without esophagitis: Secondary | ICD-10-CM | POA: Diagnosis present

## 2019-06-16 DIAGNOSIS — F17290 Nicotine dependence, other tobacco product, uncomplicated: Secondary | ICD-10-CM | POA: Diagnosis present

## 2019-06-16 DIAGNOSIS — D631 Anemia in chronic kidney disease: Secondary | ICD-10-CM | POA: Diagnosis not present

## 2019-06-16 DIAGNOSIS — E11649 Type 2 diabetes mellitus with hypoglycemia without coma: Secondary | ICD-10-CM | POA: Diagnosis not present

## 2019-06-16 DIAGNOSIS — I10 Essential (primary) hypertension: Secondary | ICD-10-CM

## 2019-06-16 DIAGNOSIS — Z6832 Body mass index (BMI) 32.0-32.9, adult: Secondary | ICD-10-CM | POA: Diagnosis not present

## 2019-06-16 DIAGNOSIS — F1010 Alcohol abuse, uncomplicated: Secondary | ICD-10-CM | POA: Diagnosis present

## 2019-06-16 DIAGNOSIS — N2581 Secondary hyperparathyroidism of renal origin: Secondary | ICD-10-CM | POA: Diagnosis not present

## 2019-06-16 DIAGNOSIS — I120 Hypertensive chronic kidney disease with stage 5 chronic kidney disease or end stage renal disease: Secondary | ICD-10-CM | POA: Diagnosis not present

## 2019-06-16 DIAGNOSIS — Z86718 Personal history of other venous thrombosis and embolism: Secondary | ICD-10-CM

## 2019-06-16 DIAGNOSIS — S82861A Displaced Maisonneuve's fracture of right leg, initial encounter for closed fracture: Secondary | ICD-10-CM | POA: Diagnosis present

## 2019-06-16 DIAGNOSIS — E781 Pure hyperglyceridemia: Secondary | ICD-10-CM | POA: Diagnosis present

## 2019-06-16 DIAGNOSIS — F32A Depression, unspecified: Secondary | ICD-10-CM | POA: Diagnosis present

## 2019-06-16 DIAGNOSIS — R2689 Other abnormalities of gait and mobility: Secondary | ICD-10-CM | POA: Diagnosis present

## 2019-06-16 DIAGNOSIS — E7800 Pure hypercholesterolemia, unspecified: Secondary | ICD-10-CM | POA: Diagnosis present

## 2019-06-16 DIAGNOSIS — Z9181 History of falling: Secondary | ICD-10-CM

## 2019-06-16 DIAGNOSIS — S93431A Sprain of tibiofibular ligament of right ankle, initial encounter: Secondary | ICD-10-CM | POA: Diagnosis not present

## 2019-06-16 DIAGNOSIS — R791 Abnormal coagulation profile: Secondary | ICD-10-CM

## 2019-06-16 DIAGNOSIS — S82301A Unspecified fracture of lower end of right tibia, initial encounter for closed fracture: Secondary | ICD-10-CM

## 2019-06-16 DIAGNOSIS — M2550 Pain in unspecified joint: Secondary | ICD-10-CM | POA: Diagnosis not present

## 2019-06-16 DIAGNOSIS — Z7901 Long term (current) use of anticoagulants: Secondary | ICD-10-CM

## 2019-06-16 DIAGNOSIS — Z992 Dependence on renal dialysis: Secondary | ICD-10-CM

## 2019-06-16 DIAGNOSIS — E039 Hypothyroidism, unspecified: Secondary | ICD-10-CM | POA: Diagnosis present

## 2019-06-16 DIAGNOSIS — R5381 Other malaise: Secondary | ICD-10-CM | POA: Diagnosis not present

## 2019-06-16 DIAGNOSIS — IMO0001 Reserved for inherently not codable concepts without codable children: Secondary | ICD-10-CM | POA: Diagnosis present

## 2019-06-16 DIAGNOSIS — R58 Hemorrhage, not elsewhere classified: Secondary | ICD-10-CM | POA: Diagnosis not present

## 2019-06-16 DIAGNOSIS — I2510 Atherosclerotic heart disease of native coronary artery without angina pectoris: Secondary | ICD-10-CM | POA: Diagnosis present

## 2019-06-16 DIAGNOSIS — Z885 Allergy status to narcotic agent status: Secondary | ICD-10-CM

## 2019-06-16 DIAGNOSIS — Z8249 Family history of ischemic heart disease and other diseases of the circulatory system: Secondary | ICD-10-CM

## 2019-06-16 DIAGNOSIS — T148XXA Other injury of unspecified body region, initial encounter: Secondary | ICD-10-CM

## 2019-06-16 DIAGNOSIS — F320 Major depressive disorder, single episode, mild: Secondary | ICD-10-CM | POA: Diagnosis not present

## 2019-06-16 DIAGNOSIS — Z8673 Personal history of transient ischemic attack (TIA), and cerebral infarction without residual deficits: Secondary | ICD-10-CM

## 2019-06-16 DIAGNOSIS — Z79899 Other long term (current) drug therapy: Secondary | ICD-10-CM

## 2019-06-16 LAB — CBC WITH DIFFERENTIAL/PLATELET
Abs Immature Granulocytes: 0.05 10*3/uL (ref 0.00–0.07)
Basophils Absolute: 0 10*3/uL (ref 0.0–0.1)
Basophils Relative: 0 %
Eosinophils Absolute: 0 10*3/uL (ref 0.0–0.5)
Eosinophils Relative: 0 %
HCT: 37.9 % — ABNORMAL LOW (ref 39.0–52.0)
Hemoglobin: 11.8 g/dL — ABNORMAL LOW (ref 13.0–17.0)
Immature Granulocytes: 1 %
Lymphocytes Relative: 7 %
Lymphs Abs: 0.6 10*3/uL — ABNORMAL LOW (ref 0.7–4.0)
MCH: 26.9 pg (ref 26.0–34.0)
MCHC: 31.1 g/dL (ref 30.0–36.0)
MCV: 86.5 fL (ref 80.0–100.0)
Monocytes Absolute: 0.6 10*3/uL (ref 0.1–1.0)
Monocytes Relative: 7 %
Neutro Abs: 7.3 10*3/uL (ref 1.7–7.7)
Neutrophils Relative %: 85 %
Platelets: 152 10*3/uL (ref 150–400)
RBC: 4.38 MIL/uL (ref 4.22–5.81)
RDW: 21.3 % — ABNORMAL HIGH (ref 11.5–15.5)
WBC: 8.6 10*3/uL (ref 4.0–10.5)
nRBC: 0 % (ref 0.0–0.2)

## 2019-06-16 LAB — COMPREHENSIVE METABOLIC PANEL
ALT: 17 U/L (ref 0–44)
AST: 20 U/L (ref 15–41)
Albumin: 2.9 g/dL — ABNORMAL LOW (ref 3.5–5.0)
Alkaline Phosphatase: 126 U/L (ref 38–126)
Anion gap: 9 (ref 5–15)
BUN: 23 mg/dL — ABNORMAL HIGH (ref 6–20)
CO2: 26 mmol/L (ref 22–32)
Calcium: 8.7 mg/dL — ABNORMAL LOW (ref 8.9–10.3)
Chloride: 97 mmol/L — ABNORMAL LOW (ref 98–111)
Creatinine, Ser: 4.38 mg/dL — ABNORMAL HIGH (ref 0.61–1.24)
GFR calc Af Amer: 16 mL/min — ABNORMAL LOW (ref >60)
GFR calc non Af Amer: 14 mL/min — ABNORMAL LOW (ref >60)
Glucose, Bld: 81 mg/dL (ref 70–99)
Potassium: 4.9 mmol/L (ref 3.5–5.1)
Sodium: 132 mmol/L — ABNORMAL LOW (ref 135–145)
Total Bilirubin: 0.9 mg/dL (ref 0.3–1.2)
Total Protein: 6.2 g/dL — ABNORMAL LOW (ref 6.5–8.1)

## 2019-06-16 LAB — CBG MONITORING, ED
Glucose-Capillary: 77 mg/dL (ref 70–99)
Glucose-Capillary: 72 mg/dL (ref 70–99)

## 2019-06-16 LAB — PROTIME-INR
INR: 8.4 (ref 0.8–1.2)
Prothrombin Time: 68.4 seconds — ABNORMAL HIGH (ref 11.4–15.2)

## 2019-06-16 LAB — GLUCOSE, CAPILLARY: Glucose-Capillary: 125 mg/dL — ABNORMAL HIGH (ref 70–99)

## 2019-06-16 LAB — ETHANOL: Alcohol, Ethyl (B): <10 mg/dL (ref <10)

## 2019-06-16 MED ORDER — ONDANSETRON HCL 4 MG PO TABS: 4.0000 mg | ORAL_TABLET | Freq: Four times a day (QID) | ORAL | Status: DC | PRN

## 2019-06-16 MED ORDER — ACETAMINOPHEN 650 MG RE SUPP: 650.0000 mg | Freq: Four times a day (QID) | RECTAL | Status: DC | PRN

## 2019-06-16 MED ORDER — ACETAMINOPHEN 325 MG PO TABS
650.0000 mg | ORAL_TABLET | Freq: Four times a day (QID) | ORAL | Status: DC | PRN
  Administered 2019-06-18: 15:00:00 650 mg via ORAL
  Filled 2019-06-16 (×3): qty 2

## 2019-06-16 MED ORDER — PANTOPRAZOLE SODIUM 40 MG PO TBEC
40.0000 mg | DELAYED_RELEASE_TABLET | Freq: Every day | ORAL | Status: DC
  Administered 2019-06-16 – 2019-06-27 (×11): 40 mg via ORAL
  Filled 2019-06-16 (×11): qty 1

## 2019-06-16 MED ORDER — MORPHINE SULFATE (PF) 2 MG/ML IV SOLN
2.0000 mg | INTRAVENOUS | Status: DC | PRN
  Administered 2019-06-18 – 2019-06-27 (×14): 2 mg via INTRAVENOUS
  Filled 2019-06-16 (×16): qty 1

## 2019-06-16 MED ORDER — METOPROLOL TARTRATE 12.5 MG HALF TABLET
12.5000 mg | ORAL_TABLET | Freq: Two times a day (BID) | ORAL | Status: DC
  Administered 2019-06-16 – 2019-06-27 (×20): 12.5 mg via ORAL
  Filled 2019-06-16 (×20): qty 1

## 2019-06-16 MED ORDER — ONDANSETRON HCL 4 MG/2ML IJ SOLN: 4.0000 mg | Freq: Four times a day (QID) | INTRAMUSCULAR | Status: DC | PRN

## 2019-06-16 MED ORDER — SPIRONOLACTONE 25 MG PO TABS
25.0000 mg | ORAL_TABLET | Freq: Every day | ORAL | Status: DC
  Administered 2019-06-16 – 2019-06-27 (×9): 25 mg via ORAL
  Filled 2019-06-16 (×10): qty 1

## 2019-06-16 MED ORDER — GABAPENTIN 300 MG PO CAPS
300.0000 mg | ORAL_CAPSULE | Freq: Every day | ORAL | Status: DC
  Administered 2019-06-16 – 2019-06-26 (×11): 300 mg via ORAL
  Filled 2019-06-16 (×11): qty 1

## 2019-06-16 MED ORDER — OMEPRAZOLE MAGNESIUM 20 MG PO TBEC: 20.0000 mg | DELAYED_RELEASE_TABLET | Freq: Every day | ORAL | Status: DC

## 2019-06-16 MED ORDER — SODIUM BICARBONATE 650 MG PO TABS
650.0000 mg | ORAL_TABLET | Freq: Two times a day (BID) | ORAL | Status: DC
  Administered 2019-06-16 – 2019-06-27 (×21): 650 mg via ORAL
  Filled 2019-06-16 (×25): qty 1

## 2019-06-16 MED ORDER — MIDODRINE HCL 5 MG PO TABS
10.0000 mg | ORAL_TABLET | Freq: Every day | ORAL | Status: DC | PRN
  Administered 2019-06-22: 07:00:00 10 mg via ORAL
  Filled 2019-06-16 (×3): qty 2

## 2019-06-16 MED ORDER — PHYTONADIONE 5 MG PO TABS
2.5000 mg | ORAL_TABLET | Freq: Once | ORAL | Status: AC
  Administered 2019-06-16: 2.5 mg via ORAL
  Filled 2019-06-16: qty 1

## 2019-06-16 MED ORDER — MIRTAZAPINE 15 MG PO TABS
15.0000 mg | ORAL_TABLET | Freq: Every day | ORAL | Status: DC
  Administered 2019-06-16 – 2019-06-26 (×11): 15 mg via ORAL
  Filled 2019-06-16 (×11): qty 1

## 2019-06-16 MED ORDER — MORPHINE SULFATE (PF) 4 MG/ML IV SOLN
4.0000 mg | Freq: Once | INTRAVENOUS | Status: AC
  Administered 2019-06-16: 4 mg via INTRAVENOUS
  Filled 2019-06-16: qty 1

## 2019-06-16 MED ORDER — INSULIN ASPART 100 UNIT/ML ~~LOC~~ SOLN
0.0000 [IU] | Freq: Three times a day (TID) | SUBCUTANEOUS | Status: DC
  Administered 2019-06-17: 1 [IU] via SUBCUTANEOUS
  Administered 2019-06-18: 5 [IU] via SUBCUTANEOUS
  Administered 2019-06-19: 2 [IU] via SUBCUTANEOUS
  Administered 2019-06-20: 1 [IU] via SUBCUTANEOUS
  Administered 2019-06-20: 13:00:00 2 [IU] via SUBCUTANEOUS
  Administered 2019-06-20: 3 [IU] via SUBCUTANEOUS
  Administered 2019-06-21: 10:00:00 5 [IU] via SUBCUTANEOUS
  Administered 2019-06-21: 18:00:00 3 [IU] via SUBCUTANEOUS
  Administered 2019-06-21: 1 [IU] via SUBCUTANEOUS
  Administered 2019-06-22 (×2): 3 [IU] via SUBCUTANEOUS
  Administered 2019-06-23: 5 [IU] via SUBCUTANEOUS
  Administered 2019-06-23: 3 [IU] via SUBCUTANEOUS
  Administered 2019-06-24: 2 [IU] via SUBCUTANEOUS
  Administered 2019-06-24: 5 [IU] via SUBCUTANEOUS
  Administered 2019-06-24: 1 [IU] via SUBCUTANEOUS
  Administered 2019-06-25: 3 [IU] via SUBCUTANEOUS
  Administered 2019-06-25: 09:00:00 2 [IU] via SUBCUTANEOUS
  Administered 2019-06-25 – 2019-06-26 (×2): 3 [IU] via SUBCUTANEOUS
  Administered 2019-06-26: 14:00:00 2 [IU] via SUBCUTANEOUS
  Administered 2019-06-27: 3 [IU] via SUBCUTANEOUS
  Administered 2019-06-27: 08:00:00 5 [IU] via SUBCUTANEOUS

## 2019-06-16 MED ORDER — CALCIUM ACETATE (PHOS BINDER) 667 MG PO CAPS
2001.0000 mg | ORAL_CAPSULE | Freq: Three times a day (TID) | ORAL | Status: DC
  Administered 2019-06-17 – 2019-06-24 (×18): 2001 mg via ORAL
  Filled 2019-06-16 (×18): qty 3

## 2019-06-16 MED ORDER — FUROSEMIDE 80 MG PO TABS
80.0000 mg | ORAL_TABLET | Freq: Every day | ORAL | Status: DC
  Administered 2019-06-16 – 2019-06-27 (×9): 80 mg via ORAL
  Filled 2019-06-16 (×10): qty 1

## 2019-06-16 MED ORDER — ALBUTEROL SULFATE (2.5 MG/3ML) 0.083% IN NEBU: 3.0000 mL | INHALATION_SOLUTION | RESPIRATORY_TRACT | Status: DC | PRN

## 2019-06-16 NOTE — Progress Notes (Signed)
ANTICOAGULATION CONSULT NOTE - Initial Consult  Pharmacy Consult for warfarin Indication: hx of PE  Allergies  Allergen Reactions  . Codeine Nausea And Vomiting    Patient Measurements: Height: 5\' 11"  (180.3 cm) Weight: 227 lb 1.2 oz (103 kg) IBW/kg (Calculated) : 75.3   Vital Signs: Temp: 98.6 F (37 C) (09/08 2046) Temp Source: Oral (09/08 2046) BP: 162/74 (09/08 2000) Pulse Rate: 95 (09/08 2000)  Labs: Recent Labs    06/16/19 1453 06/16/19 1613  HGB 11.8*  --   HCT 37.9*  --   PLT 152  --   LABPROT  --  68.4*  INR  --  8.4*  CREATININE 4.38*  --     Estimated Creatinine Clearance: 22.7 mL/min (A) (by C-G formula based on SCr of 4.38 mg/dL (H)).   Medical History: Past Medical History:  Diagnosis Date  . 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 Alcohol abuse   . Anemia   . Asthma   . Cellulitis and abscess of right lower extremity 03/01/2017  . CHF (congestive heart failure) ([place])   . Chronic kidney disease    dialysis m,w,f  . Cognitive communication deficit   . COPD (chronic obstructive pulmonary disease) ([place])   . Coronary artery disease   . Diabetes mellitus without complication ([place])   . Edema extremities 03/01/2017   bilateral swelling  . GERD (gastroesophageal reflux disease)   . High cholesterol   . High triglycerides   . Hyperlipemia   . Hypertension   . Hypothyroidism   . Major depressive disorder   . Muscle weakness   . Neuropathy   . OSA (obstructive sleep apnea)   . Other abnormalities of gait and mobility   . Peripheral vascular disease ([place])   . Pneumonia   . Sleep apnea    can't use cpap d/t feelings of suffocation    Medications:  Medications Prior to Admission  Medication Sig Dispense Refill Last Dose  . calcium acetate (PHOSLO) 667 MG capsule Take 2 capsules (1,334 mg total) by mouth 3 (three) times daily with meals. (Patient taking differently: Take 2,001 mg by mouth 3 (three) times daily with  meals. ) 180 capsule 1 Past Week at Unknown time  . furosemide (LASIX) 80 MG tablet Take 80 mg by mouth daily.   Past Week at Unknown time  . gabapentin (NEURONTIN) 300 MG capsule Take 300 mg by mouth at bedtime.   Past Week at Unknown time  . insulin aspart (NOVOLOG) 100 UNIT/ML injection Inject 6 Units into the skin 3 (three) times daily with meals. (Patient taking differently: Inject 3-15 Units into the skin 3 (three) times daily with meals. Per sliding scale) 10 mL 11 06/14/2019 at Unknown time  . insulin detemir (LEVEMIR) 100 UNIT/ML injection Inject 0.09 mLs (9 Units total) into the skin daily. 10 mL 11 Past Week at Unknown time  . metoprolol tartrate (LOPRESSOR) 25 MG tablet Take 0.5 tablets (12.5 mg total) by mouth 2 (two) times daily. 60 tablet 1 06/15/2019 at 1300  . midodrine (PROAMATINE) 10 MG tablet Take 1 tablet (10 mg total) by mouth 2 (two) times daily with a meal. (Patient taking differently: Take 10 mg by mouth daily as needed (for low bp from dialysis). ) 60 tablet 1 unknown  . mirtazapine (REMERON) 15 MG tablet Take 15 mg by mouth at bedtime as needed.   unknown  . mometasone-formoterol (DULERA) 100-5 MCG/ACT AERO Inhale 2 puffs into the lungs every  morning. (Patient taking differently: Inhale 2 puffs into the lungs daily as needed for wheezing or shortness of breath. ) 13 g 2 Past Week at Unknown time  . omeprazole (PRILOSEC OTC) 20 MG tablet Take 1 tablet (20 mg total) by mouth daily. 30 tablet 1 06/15/2019 at Unknown time  . PROAIR HFA 108 (90 Base) MCG/ACT inhaler Inhale 2 puffs into the lungs every 4 (four) hours as needed for wheezing or shortness of breath.   0 unknown  . sodium bicarbonate 650 MG tablet Take 1 tablet (650 mg total) by mouth 2 (two) times daily. (Patient taking differently: Take 650 mg by mouth daily. ) 60 tablet 1 06/15/2019 at Unknown time  . spironolactone (ALDACTONE) 25 MG tablet Take 25 mg by mouth daily.   06/15/2019 at Unknown time  . warfarin (COUMADIN) 5 MG  tablet Take 7.5 mg by mouth See admin instructions. Take 7.5mg  on non-dialysis days (Tuesdays, Thursdays, Saturdays, and Sundays) Do not take on non-dialysis days.   06/15/2019 at 1300  . apixaban (ELIQUIS) 5 MG TABS tablet Take 2 tablets (10 mg total) by mouth 2 (two) times daily for 7 days. (Patient not taking: Reported on 06/16/2019) 28 tablet 0 Not Taking at Unknown time  . apixaban (ELIQUIS) 5 MG TABS tablet Take 1 tablet (5 mg total) by mouth 2 (two) times daily for 30 days. (Patient not taking: Reported on 06/16/2019) 60 tablet 3 Not Taking at Unknown time    Assessment: Pharmacy consulted to dose warfarin in patient with a history of PE.  Patient is ESRD on hemodialysis admitted with right ankle fracture   Patient's INR supratherapeutic at 8.4 and given vitamin K PO 2.5 mg.      Goal of Therapy:  INR 2-3 Monitor platelets by anticoagulation protocol: Yes   Plan:  Hold warfarin x 1 dose Monitor daily INR and s/s of bleeding.  Steven [name] 06/16/2019,9:55 PM

## 2019-06-16 NOTE — ED Notes (Signed)
Patient transported to CT 

## 2019-06-16 NOTE — H&P (Signed)
History and Physical    Steven [name] [id number] DOB: [birth year] DOA: 06/16/2019  PCP: Maryruth Hancock, MD   Patient coming from: Home   Chief Complaint: Right ankle pain.   HPI: Steven [name] is a 58 y.o. male with medical history significant of history of pulmonary embolism, alcohol abuse, ambulatory dysfunction, hypertension, COPD, type 2 diabetes mellitus and end-stage renal disease on hemodialysis.  Patient uses a walker for ambulation.  Yesterday while trying to get bathroom he lost his balance while holding his walker and twisted his right ankle.  No head trauma no loss of consciousness.  Post trauma he had significant pain on his right ankle, sharp in nature, 10 out of 10 in intensity, worse with weightbearing, associated with local edema, no fevers, no chills.  No improving factors.  Yesterday he missed his hemodialysis session due to right ankle pain.  Today he presented to the hospital due to persistent symptoms.   ED Course: Patient was diagnosed with right ankle fracture, orthopedics was consulted, recommendations for splinting, remain nonweightbearing to the right lower extremity and transferred to [facility], for further surgical evaluation.  Review of Systems:  1. General: No fevers, no chills, no weight gain or weight loss 2. ENT: No runny nose or sore throat, no hearing disturbances 3. Pulmonary: No dyspnea, cough, wheezing, or hemoptysis 4. Cardiovascular: No angina, claudication, lower extremity edema, pnd or orthopnea 5. Gastrointestinal: No nausea or vomiting, no diarrhea or constipation 6. Hematology: No easy bruisability or frequent infections 7. Urology: No dysuria, hematuria or increased urinary frequency 8. Dermatology: No rashes. 9. Neurology: No seizures or paresthesias 10. Musculoskeletal: right ankle pain and edema, as mentioned in the HPI.   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)     Alcohol abuse    Anemia    Asthma    Cellulitis and abscess of right lower extremity 03/01/2017   CHF (congestive heart failure) (HCC)    Chronic kidney disease    dialysis m,w,f   Cognitive communication deficit    COPD (chronic obstructive pulmonary disease) ([place])    Coronary artery disease    Diabetes mellitus without complication ([place])    Edema extremities 03/01/2017   bilateral swelling   GERD (gastroesophageal reflux disease)    High cholesterol    High triglycerides    Hyperlipemia    Hypertension    Hypothyroidism    Major depressive disorder    Muscle weakness    Neuropathy    OSA (obstructive sleep apnea)    Other abnormalities of gait and mobility    Peripheral vascular disease ([place])    Pneumonia    Sleep apnea    can't use cpap d/t feelings of suffocation    Past Surgical History:  Procedure Laterality Date   A/V FISTULAGRAM Right 08/27/2017   Procedure: A/V FISTULAGRAM;  Surgeon: Katha Cabal, MD;  Location: [place] [facility];  Service: Cardiovascular;  Laterality: Right;   A/V FISTULAGRAM Right 11/04/2018   Procedure: A/V FISTULAGRAM;  Surgeon: Katha Cabal, MD;  Location: [place] [facility];  Service: Cardiovascular;  Laterality: Right;   A/V SHUNT INTERVENTION N/A 06/23/2018   Procedure: A/V SHUNT INTERVENTION;  Surgeon: Algernon Huxley, MD;  Location: [place] [facility];  Service: Cardiovascular;  Laterality: N/A;   APPENDECTOMY  2010   AV FISTULA PLACEMENT Right 06/14/2017   Procedure: ARTERIOVENOUS (AV) FISTULA CREATION WRIST;  Surgeon: Katha Cabal,  MD;  Location: [facility];  Service: Vascular;  Laterality: Right;   DIALYSIS/PERMA CATHETER INSERTION N/A 03/05/2017   Procedure: Dialysis/Perma Catheter Insertion;  Surgeon: Katha Cabal, MD;  Location: [place] [facility];  Service: Cardiovascular;  Laterality: N/A;   DIALYSIS/PERMA CATHETER INSERTION N/A 09/20/2017   Procedure: DIALYSIS/PERMA CATHETER  INSERTION;  Surgeon: Katha Cabal, MD;  Location: [place] [facility];  Service: Cardiovascular;  Laterality: N/A;   DIALYSIS/PERMA CATHETER INSERTION N/A 01/26/2019   Procedure: DIALYSIS/PERMA CATHETER INSERTION/Exchange;  Surgeon: Algernon Huxley, MD;  Location: [place] [facility];  Service: Cardiovascular;  Laterality: N/A;   DIALYSIS/PERMA CATHETER INSERTION N/A 01/27/2019   Procedure: DIALYSIS/PERMA CATHETER INSERTION;  Surgeon: Katha Cabal, MD;  Location: [place] [facility];  Service: Cardiovascular;  Laterality: N/A;   EXCHANGE OF A DIALYSIS CATHETER  03/29/2017   Procedure: Exchange Of A Dialysis Catheter;  Surgeon: Katha Cabal, MD;  Location: [place] [facility];  Service: Cardiovascular;;   IRRIGATION AND DEBRIDEMENT FOOT Right 03/01/2017   Procedure: IRRIGATION AND DEBRIDEMENT FOOT;  Surgeon: Albertine Patricia, DPM;  Location: [facility];  Service: Podiatry;  Laterality: Right;  application of wound vac   PERIPHERAL VASCULAR THROMBECTOMY Right 06/18/2018   Procedure: PERIPHERAL VASCULAR THROMBECTOMY;  Surgeon: Algernon Huxley, MD;  Location: [place] [facility];  Service: Cardiovascular;  Laterality: Right;   VASCULAR ACCESS DEVICE INSERTION Right 01/30/2019   Procedure: INSERTION OF HERO VASCULAR ACCESS DEVICE;  Surgeon: Katha Cabal, MD;  Location: [facility];  Service: Vascular;  Laterality: Right;     reports that he has quit smoking. His smokeless tobacco use includes snuff. He reports current alcohol use. He reports that he does not use drugs.  Allergies  Allergen Reactions   Codeine Nausea And Vomiting    Family History  Problem Relation Age of Onset   CAD Father    Heart disease Father      Prior to Admission medications   Medication Sig Start Date End Date Taking? Authorizing Provider  calcium acetate (PHOSLO) 667 MG capsule Take 2 capsules (1,334 mg total) by mouth 3 (three) times daily with meals. Patient taking differently: Take 2,001 mg  by mouth 3 (three) times daily with meals.  12/16/18  Yes Barton Dubois, MD  furosemide (LASIX) 80 MG tablet Take 80 mg by mouth daily.   Yes [provider]  gabapentin (NEURONTIN) 300 MG capsule Take 300 mg by mouth at bedtime. 04/13/19  Yes [provider]  insulin aspart (NOVOLOG) 100 UNIT/ML injection Inject 6 Units into the skin 3 (three) times daily with meals. Patient taking differently: Inject 3-15 Units into the skin 3 (three) times daily with meals. Per sliding scale 01/30/19  Yes Gouru, Aruna, MD  insulin detemir (LEVEMIR) 100 UNIT/ML injection Inject 0.09 mLs (9 Units total) into the skin daily. 02/14/19  Yes Shah, Pratik D, DO  metoprolol tartrate (LOPRESSOR) 25 MG tablet Take 0.5 tablets (12.5 mg total) by mouth 2 (two) times daily. 10/19/18  Yes Barton Dubois, MD  midodrine (PROAMATINE) 10 MG tablet Take 1 tablet (10 mg total) by mouth 2 (two) times daily with a meal. Patient taking differently: Take 10 mg by mouth daily as needed (for low bp from dialysis).  10/19/18  Yes Barton Dubois, MD  mirtazapine (REMERON) 15 MG tablet Take 15 mg by mouth at bedtime as needed. 05/05/19  Yes [provider]  mometasone-formoterol (DULERA) 100-5 MCG/ACT AERO Inhale 2 puffs into the lungs every morning. Patient  taking differently: Inhale 2 puffs into the lungs daily as needed for wheezing or shortness of breath.  12/16/18  Yes Barton Dubois, MD  omeprazole (PRILOSEC OTC) 20 MG tablet Take 1 tablet (20 mg total) by mouth daily. 10/19/18  Yes Barton Dubois, MD  PROAIR HFA 108 [phone] Base) MCG/ACT inhaler Inhale 2 puffs into the lungs every 4 (four) hours as needed for wheezing or shortness of breath.  09/04/17  Yes [provider]  sodium bicarbonate 650 MG tablet Take 1 tablet (650 mg total) by mouth 2 (two) times daily. Patient taking differently: Take 650 mg by mouth daily.  02/14/19  Yes Shah, Pratik D, DO  spironolactone (ALDACTONE) 25 MG tablet Take 25 mg by mouth  daily.   Yes [provider]  warfarin (COUMADIN) 5 MG tablet Take 7.5 mg by mouth See admin instructions. Take 7.5mg  on non-dialysis days (Tuesdays, Thursdays, Saturdays, and Sundays) Do not take on non-dialysis days. 05/05/19  Yes [provider]  apixaban (ELIQUIS) 5 MG TABS tablet Take 2 tablets (10 mg total) by mouth 2 (two) times daily for 7 days. Patient not taking: Reported on 06/16/2019 02/14/19 02/21/19  Heath Lark D, DO  apixaban (ELIQUIS) 5 MG TABS tablet Take 1 tablet (5 mg total) by mouth 2 (two) times daily for 30 days. Patient not taking: Reported on 06/16/2019 02/21/19 03/23/19  Heath Lark D, DO    Physical Exam: Vitals:   06/16/19 1500 06/16/19 1610 06/16/19 1700 06/16/19 1800  BP: (!) 147/98 (!) 159/90 (!) 153/79 (!) 157/93  Pulse: 100 95 92 90  Resp: (!) 21 20 16 16   Temp:      TempSrc:      SpO2: 94% 96% 98% 97%  Weight:      Height:        Vitals:   06/16/19 1500 06/16/19 1610 06/16/19 1700 06/16/19 1800  BP: (!) 147/98 (!) 159/90 (!) 153/79 (!) 157/93  Pulse: 100 95 92 90  Resp: (!) 21 20 16 16   Temp:      TempSrc:      SpO2: 94% 96% 98% 97%  Weight:      Height:       General: Not in pain or dyspnea, deconditioned  Neurology: Awake and alert, non focal Head and Neck. Head normocephalic. Neck supple with no adenopathy or thyromegaly.   E ENT: mild pallor, no icterus, oral mucosa moist Cardiovascular: No JVD. S1-S2 present, rhythmic, no gallops, rubs, or murmurs. No lower extremity edema. Pulmonary:  Positive breath sounds bilaterally, adequate air movement, no wheezing, rhonchi or rales. Gastrointestinal. Abdomen with no organomegaly, non tender, no rebound or guarding Skin. No rashes Musculoskeletal: right ankle with splint in place.     Labs on Admission: I have personally reviewed following labs and imaging studies  CBC: Recent Labs  Lab 06/16/19 1453  WBC 8.6  NEUTROABS 7.3  HGB 11.8*  HCT 37.9*  MCV 86.5  PLT 0000000   Basic  Metabolic Panel: Recent Labs  Lab 06/16/19 1453  NA 132*  K 4.9  CL 97*  CO2 26  GLUCOSE 81  BUN 23*  CREATININE 4.38*  CALCIUM 8.7*   GFR: Estimated Creatinine Clearance: 22.7 mL/min (A) (by C-G formula based on SCr of 4.38 mg/dL (H)). Liver Function Tests: Recent Labs  Lab 06/16/19 1453  AST 20  ALT 17  ALKPHOS 126  BILITOT 0.9  PROT 6.2*  ALBUMIN 2.9*   No results for input(s): LIPASE, AMYLASE in the last 168  hours. No results for input(s): AMMONIA in the last 168 hours. Coagulation Profile: Recent Labs  Lab 06/16/19 1613  INR 8.4*   Cardiac Enzymes: No results for input(s): CKTOTAL, CKMB, CKMBINDEX, TROPONINI in the last 168 hours. BNP (last 3 results) No results for input(s): PROBNP in the last 8760 hours. HbA1C: No results for input(s): HGBA1C in the last 72 hours. CBG: Recent Labs  Lab 06/16/19 1403  GLUCAP 77   Lipid Profile: No results for input(s): CHOL, HDL, LDLCALC, TRIG, CHOLHDL, LDLDIRECT in the last 72 hours. Thyroid Function Tests: No results for input(s): TSH, T4TOTAL, FREET4, T3FREE, THYROIDAB in the last 72 hours. Anemia Panel: No results for input(s): VITAMINB12, FOLATE, FERRITIN, TIBC, IRON, RETICCTPCT in the last 72 hours. Urine analysis:    Component Value Date/Time   COLORURINE RED (A) 02/11/2019 1336   APPEARANCEUR HAZY (A) 02/11/2019 1336   LABSPEC 1.010 02/11/2019 1336   PHURINE 7.0 02/11/2019 1336   GLUCOSEU 250 (A) 02/11/2019 1336   HGBUR LARGE (A) 02/11/2019 1336   BILIRUBINUR NEGATIVE 02/11/2019 1336   KETONESUR TRACE (A) 02/11/2019 1336   PROTEINUR >300 (A) 02/11/2019 1336   NITRITE POSITIVE (A) 02/11/2019 1336   LEUKOCYTESUR SMALL (A) 02/11/2019 1336    Radiological Exams on Admission: Dg Ankle Complete Right  Result Date: 06/16/2019 CLINICAL DATA:  Fall 2 days ago with persistent ankle pain and swelling, initial encounter EXAM: RIGHT ANKLE - COMPLETE 3+ VIEW COMPARISON:  None. FINDINGS: Oblique fracture through  the distal tibial metaphysis is noted with mild displacement at the fracture site. Prior callus formation in the distal fibula is noted related to prior fracture. No other fracture is identified. IMPRESSION: Oblique fracture through the distal tibial metaphysis. Electronically Signed   By: Inez Catalina M.D.   On: 06/16/2019 14:29   Ct Head Wo Contrast  Result Date: 06/16/2019 CLINICAL DATA:  Status post fall.  Anticoagulation. EXAM: CT HEAD WITHOUT CONTRAST TECHNIQUE: Contiguous axial images were obtained from the base of the skull through the vertex without intravenous contrast. COMPARISON:  02/12/2019 FINDINGS: Brain: No evidence of acute infarction, hemorrhage, hydrocephalus, extra-axial collection or mass lesion/mass effect. There is mild diffuse low-attenuation within the subcortical and periventricular white matter compatible with chronic microvascular disease. Prominence of sulci and ventricles compatible with brain atrophy. Vascular: No hyperdense vessel or unexpected calcification. Skull: Normal. Negative for fracture or focal lesion. Sinuses/Orbits: No acute finding. Other: None. IMPRESSION: 1. No acute intracranial abnormality 2. Chronic small vessel ischemic change and brain atrophy. Electronically Signed   By: Kerby Moors M.D.   On: 06/16/2019 18:35   Ct Ankle Right Wo Contrast  Result Date: 06/16/2019 CLINICAL DATA:  Evaluate ankle fractures. EXAM: CT OF THE RIGHT ANKLE WITHOUT CONTRAST TECHNIQUE: Multidetector CT imaging of the right ankle was performed according to the standard protocol. Multiplanar CT image reconstructions were also generated. COMPARISON:  Radiographs 06/16/2019 FINDINGS: Remote appearing ununited oblique coursing distal tibia fracture. Extensive bony resorptive changes along the fracture margins but no osseous bridging. There is callus formation noted along the lateral aspect of the tibial cortex and some bony bridging across the upper lateral fracture site. Maximum of 8  mm of separation along the medial aspect of the fracture and no callus formation. There could be acute on chronic fractures. There is a largely healed distal fibular fracture with fairly extensive callus formation. The ankle mortise is maintained. No fractures of the talus or calcaneus. No ankle joint effusion. The subtalar joints are maintained. Extensive small vessel disease  with extensive calcifications. IMPRESSION: 1. Remote appearing ununited distal tibia fractures. Possible acute on chronic fractures. 2. Healed distal fibular fracture. 3. Intact ankle mortise. No mid or hindfoot fractures are identified. 4. Extensive small vessel disease. Electronically Signed   By: Marijo Sanes M.D.   On: 06/16/2019 18:56    EKG: Independently reviewed.  98 bpm, normal axis, normal intervals, sinus rhythm, no ST segment changes, no T wave changes.  Assessment/Plan Principal Problem:   Ankle fracture Active Problems:   Diabetes mellitus type 2 in obese (HCC)   Hyperlipidemia   Morbid obesity (HCC)   Essential hypertension   End stage renal disease (HCC)   COPD, mild ([place])    58 year old male who has multiple chronic medical problems including type 2 diabetes mellitus, hypertension, end-stage renal disease on hemodialysis and COPD.  He fell while using his walker, twisted his right ankle.  Significant pain post trauma.  On his initial physical examination blood pressure 158/103, pulse rate 90, respiratory rate 15, oxygen saturation 97%.  He has mild pallor, his lungs are clear to auscultation bilaterally, heart S1-S2 present rhythmic, abdomen soft, right lower extremity with a splint. Sodium 132, potassium 4.9, chloride 97, bicarb 26, glucose 81, BUN 23, creatinine 4.3, white count 8.6, hemoglobin 9.8, hematocrit 37.9, platelets 152.  INR 8.4.  SARS COVID-19 pending.  Right ankle with oblique fracture through the distal tibial metaphysis.  Right ankle CT with remote appearing ununited distal tibia fractures,  possible acute on chronic fractures.  Healed distal tibia fracture.  Head CT negative for acute changes.  Patient will be admitted to the hospital working diagnosis of right ankle fracture.  1.  Right ankle fracture.  Continue pain control with IV morphine, splint has been placed in the emergency department.  Continue to follow-up with orthopedic recommendations.  Will need his coagulopathy to be corrected and patient to undergo hemodialysis before surgical intervention.  2.  End-stage renal disease on hemodialysis.  Clinically not significantly hypervolemic, his potassium is 4.9 and bicarbonate 26.  Will need routine hemodialysis during hospitalization.  Especially before any surgical intervention.  Continue furosemide and spironolactone.  Continue serum bicarbonate and calcium acetate.  3.  History of pulmonary embolism on warfarin.  Presently his coagulopathic, will hold warfarin and follow-up with pharmacy protocol.  Target INR 2-3.  4.  Hypertension.  Continue blood pressure control with metoprolol, furosemide and spironolactone.  5.  Type 2 diabetes mellitus.  His random glucose on admission is 81.  Hold on basal insulin for now, to prevent hypoglycemia.  Will continue glucose coverage and monitor with insulin sliding.  6.  COPD.  No signs of acute exacerbation, continue bronchodilator therapy.  7.  Depression.  Continue mirtazapine.  DVT prophylaxis: warfarin  Code Status: full  Family Communication: no family at the bedside   Disposition Plan: transfer to [place] called: Orthopedics  Admission status: Inpatient.     Bellamy Judson Gerome Apley MD [facility]   06/16/2019, 6:58 PM
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
## 2019-06-16 NOTE — Progress Notes (Signed)
Case discussed with the emergency department provider.  At this juncture he has a distal tibia fracture that needs operative management.  He would be appropriate for transfer to [facility] given his multiple medical comorbidities.  We have recommended a splint with CT scan at this juncture.  He should remain nonweightbearing to the right lower extremity.  He will need to be optimized medically before surgery with INR approaching 1-1.3.  Once he has arrived to [facility] we will evaluate him and plan for operative intervention when he is able to safely have this undertaken.

## 2019-06-16 NOTE — ED Triage Notes (Addendum)
Per EMS, pt from home. Pt called EMS for fall that occurred 2 days ago. Pt c/o RT ankle pain and a skin tear on Lt arm. When EMS arrived, pt was noted to have ETOH beside bed. Pt reported that he was a diabetic and wanted his CBG checked. CBG was 32. Pt given oral glucagon, he ate doughnut, and drank a "shake" he had at the house. Pt states he has not eaten in two days because he has not had an appetite. CBG increased to 70. During transport CBG dropped to 57. Pt noted to be shaky, but is AOx4. Pt is on dialysis and missed appointment on Monday. CBG 77 at this time.

## 2019-06-16 NOTE — ED Notes (Signed)
Date and time results received: 06/16/19 1640  Test: INR Critical Value: 8.4  Name of Provider Notified: Dr. Wilson Singer  Orders Received? Or Actions Taken?: See chart

## 2019-06-16 NOTE — ED Notes (Signed)
ED TO INPATIENT HANDOFF REPORT  ED Nurse Name and Phone #: Eustaquio Maize [phone]  S Name/Age/Gender Steven [name] 59 y.o. male Room/Bed: APA01/APA01  Code Status   Code Status: Prior  Home/SNF/Other Home Patient oriented to: self, place, time and situation Is this baseline? Yes   Triage Complete: Triage complete  Chief Complaint Fall   Triage Note Per EMS, pt from home. Pt called EMS for fall that occurred 2 days ago. Pt c/o RT ankle pain and a skin tear on Lt arm. When EMS arrived, pt was noted to have ETOH beside bed. Pt reported that he was a diabetic and wanted his CBG checked. CBG was 32. Pt given oral glucagon, he ate doughnut, and drank a "shake" he had at the house. Pt states he has not eaten in two days because he has not had an appetite. CBG increased to 70. During transport CBG dropped to 57. Pt noted to be shaky, but is AOx4. Pt is on dialysis and missed appointment on Monday. CBG 77 at this time.   Allergies Allergies  Allergen Reactions  . Codeine Nausea And Vomiting    Level of Care/Admitting Diagnosis ED Disposition    ED Disposition Condition [facility] Area: [place] [[id number]]  Level of Care: Med-Surg [16]  Covid Evaluation: Asymptomatic Screening Protocol (No Symptoms)  Diagnosis: Ankle fracture [id number]  Admitting Physician: Tawni Millers [id number]  Attending Physician: Tawni Millers [id number]  Estimated length of stay: 3 - 4 days  Certification:: I certify this patient will need inpatient services for at least 2 midnights  PT Class (Do Not Modify): Inpatient [101]  PT Acc Code (Do Not Modify): Private [1]       B Medical/Surgery History Past Medical History:  Diagnosis Date  . 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 Alcohol abuse   . Anemia   . Asthma   . Cellulitis and abscess of right lower extremity 03/01/2017  . CHF (congestive heart failure) ([place])   . Chronic  kidney disease    dialysis m,w,f  . Cognitive communication deficit   . COPD (chronic obstructive pulmonary disease) ([place])   . Coronary artery disease   . Diabetes mellitus without complication ([place])   . Edema extremities 03/01/2017   bilateral swelling  . GERD (gastroesophageal reflux disease)   . High cholesterol   . High triglycerides   . Hyperlipemia   . Hypertension   . Hypothyroidism   . Major depressive disorder   . Muscle weakness   . Neuropathy   . OSA (obstructive sleep apnea)   . Other abnormalities of gait and mobility   . Peripheral vascular disease ([place])   . Pneumonia   . Sleep apnea    can't use cpap d/t feelings of suffocation   Past Surgical History:  Procedure Laterality Date  . A/V FISTULAGRAM Right 08/27/2017   Procedure: A/V FISTULAGRAM;  Surgeon: Katha Cabal, MD;  Location: [place] [facility];  Service: Cardiovascular;  Laterality: Right;  . A/V FISTULAGRAM Right 11/04/2018   Procedure: A/V FISTULAGRAM;  Surgeon: Katha Cabal, MD;  Location: [place] [facility];  Service: Cardiovascular;  Laterality: Right;  . A/V SHUNT INTERVENTION N/A 06/23/2018   Procedure: A/V SHUNT INTERVENTION;  Surgeon: Algernon Huxley, MD;  Location: [place] [facility];  Service: Cardiovascular;  Laterality: N/A;  . APPENDECTOMY  2010  . AV FISTULA PLACEMENT Right 06/14/2017   Procedure: ARTERIOVENOUS (AV)  FISTULA CREATION WRIST;  Surgeon: Katha Cabal, MD;  Location: [facility];  Service: Vascular;  Laterality: Right;  . DIALYSIS/PERMA CATHETER INSERTION N/A 03/05/2017   Procedure: Dialysis/Perma Catheter Insertion;  Surgeon: Katha Cabal, MD;  Location: [place] [facility];  Service: Cardiovascular;  Laterality: N/A;  . DIALYSIS/PERMA CATHETER INSERTION N/A 09/20/2017   Procedure: DIALYSIS/PERMA CATHETER INSERTION;  Surgeon: Katha Cabal, MD;  Location: [place] [facility];  Service: Cardiovascular;  Laterality: N/A;  . DIALYSIS/PERMA CATHETER  INSERTION N/A 01/26/2019   Procedure: DIALYSIS/PERMA CATHETER INSERTION/Exchange;  Surgeon: Algernon Huxley, MD;  Location: [place] [facility];  Service: Cardiovascular;  Laterality: N/A;  . DIALYSIS/PERMA CATHETER INSERTION N/A 01/27/2019   Procedure: DIALYSIS/PERMA CATHETER INSERTION;  Surgeon: Katha Cabal, MD;  Location: [place] [facility];  Service: Cardiovascular;  Laterality: N/A;  . EXCHANGE OF A DIALYSIS CATHETER  03/29/2017   Procedure: Exchange Of A Dialysis Catheter;  Surgeon: Katha Cabal, MD;  Location: [place] [facility];  Service: Cardiovascular;;  . IRRIGATION AND DEBRIDEMENT FOOT Right 03/01/2017   Procedure: IRRIGATION AND DEBRIDEMENT FOOT;  Surgeon: Albertine Patricia, DPM;  Location: [facility];  Service: Podiatry;  Laterality: Right;  application of wound vac  . PERIPHERAL VASCULAR THROMBECTOMY Right 06/18/2018   Procedure: PERIPHERAL VASCULAR THROMBECTOMY;  Surgeon: Algernon Huxley, MD;  Location: [place] [facility];  Service: Cardiovascular;  Laterality: Right;  Marland Kitchen VASCULAR ACCESS DEVICE INSERTION Right 01/30/2019   Procedure: INSERTION OF HERO VASCULAR ACCESS DEVICE;  Surgeon: Katha Cabal, MD;  Location: [facility];  Service: Vascular;  Laterality: Right;     A IV Location/Drains/Wounds Patient Lines/Drains/Airways Status   Active Line/Drains/Airways    Name:   Placement date:   Placement time:   Site:   Days:   Peripheral IV 06/16/19 Left Antecubital   06/16/19    1611    Antecubital   less than 1   Fistula / Graft Right Forearm Arteriovenous fistula   06/14/17    1230    Forearm   732   Fistula / Graft Right Upper arm Arteriovenous vein graft   01/30/19    1329    Upper arm   137   Hemodialysis Catheter Right Femoral vein Double-lumen   01/27/19    1800    Femoral vein   140   External Urinary Catheter   02/11/19    1020    -   125   Incision (Closed) 01/30/19 Arm Right   01/30/19    1327     137   Incision (Closed) 01/30/19 Arm Right   01/30/19    1439      137   Incision (Closed) 01/30/19 Arm Right   01/30/19    1439     137   Incision (Closed) 01/30/19 Neck Right   01/30/19    1518     137          Intake/Output Last 24 hours No intake or output data in the 24 hours ending 06/16/19 1941  Labs/Imaging Results for orders placed or performed during the hospital encounter of 06/16/19 (from the past 48 hour(s))  CBG monitoring, ED     Status: None   Collection Time: 06/16/19  2:03 PM  Result Value Ref Range   Glucose-Capillary 77 70 - 99 mg/dL  CBC with Differential     Status: Abnormal   Collection Time: 06/16/19  2:53 PM  Result Value Ref Range   WBC 8.6  4.0 - 10.5 K/uL   RBC 4.38 4.22 - 5.81 MIL/uL   Hemoglobin 11.8 (L) 13.0 - 17.0 g/dL   HCT 37.9 (L) 39.0 - 52.0 %   MCV 86.5 80.0 - 100.0 fL   MCH 26.9 26.0 - 34.0 pg   MCHC 31.1 30.0 - 36.0 g/dL   RDW 21.3 (H) 11.5 - 15.5 %   Platelets 152 150 - 400 K/uL   nRBC 0.0 0.0 - 0.2 %   Neutrophils Relative % 85 %   Neutro Abs 7.3 1.7 - 7.7 K/uL   Lymphocytes Relative 7 %   Lymphs Abs 0.6 (L) 0.7 - 4.0 K/uL   Monocytes Relative 7 %   Monocytes Absolute 0.6 0.1 - 1.0 K/uL   Eosinophils Relative 0 %   Eosinophils Absolute 0.0 0.0 - 0.5 K/uL   Basophils Relative 0 %   Basophils Absolute 0.0 0.0 - 0.1 K/uL   Immature Granulocytes 1 %   Abs Immature Granulocytes 0.05 0.00 - 0.07 K/uL    Comment: Performed at [facility], [address], [place], [place] [address]  Comprehensive metabolic panel     Status: Abnormal   Collection Time: 06/16/19  2:53 PM  Result Value Ref Range   Sodium 132 (L) 135 - 145 mmol/L   Potassium 4.9 3.5 - 5.1 mmol/L   Chloride 97 (L) 98 - 111 mmol/L   CO2 26 22 - 32 mmol/L   Glucose, Bld 81 70 - 99 mg/dL   BUN 23 (H) 6 - 20 mg/dL   Creatinine, Ser 4.38 (H) 0.61 - 1.24 mg/dL   Calcium 8.7 (L) 8.9 - 10.3 mg/dL   Total Protein 6.2 (L) 6.5 - 8.1 g/dL   Albumin 2.9 (L) 3.5 - 5.0 g/dL   AST 20 15 - 41 U/L   ALT 17 0 - 44 U/L   Alkaline Phosphatase 126 38 -  126 U/L   Total Bilirubin 0.9 0.3 - 1.2 mg/dL   GFR calc non Af Amer 14 (L) >60 mL/min   GFR calc Af Amer 16 (L) >60 mL/min   Anion gap 9 5 - 15    Comment: Performed at [facility], [address] [place], [place] [address]  Ethanol     Status: None   Collection Time: 06/16/19  2:53 PM  Result Value Ref Range   Alcohol, Ethyl (B) <10 <10 mg/dL    Comment: (NOTE) Lowest detectable limit for serum alcohol is 10 mg/dL. For medical purposes only. Performed at [facility], [address] [place], [place] [address]   Protime-INR     Status: Abnormal   Collection Time: 06/16/19  4:13 PM  Result Value Ref Range   Prothrombin Time 68.4 (H) 11.4 - 15.2 seconds   INR 8.4 (HH) 0.8 - 1.2    Comment: RESULT REPEATED AND VERIFIED CRITICAL RESULT CALLED TO, READ BACK BY AND VERIFIED WITH: KEMP,C@1640  BY MATTHEWS, B 9.8.2020 (NOTE) INR goal varies based on device and disease states. Performed at [facility], [address], [place], [place] [address]    Dg Ankle Complete Right  Result Date: 06/16/2019 CLINICAL DATA:  Fall 2 days ago with persistent ankle pain and swelling, initial encounter EXAM: RIGHT ANKLE - COMPLETE 3+ VIEW COMPARISON:  None. FINDINGS: Oblique fracture through the distal tibial metaphysis is noted with mild displacement at the fracture site. Prior callus formation in the distal fibula is noted related to prior fracture. No other fracture is identified. IMPRESSION: Oblique fracture through the distal tibial metaphysis.  Electronically Signed   By: Inez Catalina M.D.   On: 06/16/2019 14:29   Ct Head Wo Contrast  Result Date: 06/16/2019 CLINICAL DATA:  Status post fall.  Anticoagulation. EXAM: CT HEAD WITHOUT CONTRAST TECHNIQUE: Contiguous axial images were obtained from the base of the skull through the vertex without intravenous contrast. COMPARISON:  02/12/2019 FINDINGS: Brain: No evidence of acute infarction, hemorrhage, hydrocephalus, extra-axial collection or mass lesion/mass  effect. There is mild diffuse low-attenuation within the subcortical and periventricular white matter compatible with chronic microvascular disease. Prominence of sulci and ventricles compatible with brain atrophy. Vascular: No hyperdense vessel or unexpected calcification. Skull: Normal. Negative for fracture or focal lesion. Sinuses/Orbits: No acute finding. Other: None. IMPRESSION: 1. No acute intracranial abnormality 2. Chronic small vessel ischemic change and brain atrophy. Electronically Signed   By: Kerby Moors M.D.   On: 06/16/2019 18:35   Ct Ankle Right Wo Contrast  Result Date: 06/16/2019 CLINICAL DATA:  Evaluate ankle fractures. EXAM: CT OF THE RIGHT ANKLE WITHOUT CONTRAST TECHNIQUE: Multidetector CT imaging of the right ankle was performed according to the standard protocol. Multiplanar CT image reconstructions were also generated. COMPARISON:  Radiographs 06/16/2019 FINDINGS: Remote appearing ununited oblique coursing distal tibia fracture. Extensive bony resorptive changes along the fracture margins but no osseous bridging. There is callus formation noted along the lateral aspect of the tibial cortex and some bony bridging across the upper lateral fracture site. Maximum of 8 mm of separation along the medial aspect of the fracture and no callus formation. There could be acute on chronic fractures. There is a largely healed distal fibular fracture with fairly extensive callus formation. The ankle mortise is maintained. No fractures of the talus or calcaneus. No ankle joint effusion. The subtalar joints are maintained. Extensive small vessel disease with extensive calcifications. IMPRESSION: 1. Remote appearing ununited distal tibia fractures. Possible acute on chronic fractures. 2. Healed distal fibular fracture. 3. Intact ankle mortise. No mid or hindfoot fractures are identified. 4. Extensive small vessel disease. Electronically Signed   By: Marijo Sanes M.D.   On: 06/16/2019 18:56     Pending Labs Unresulted Labs (From admission, onward)    Start     Ordered   06/16/19 1536  SARS CORONAVIRUS 2 (TAT 6-24 HRS) Nasopharyngeal Nasopharyngeal Swab  (Asymptomatic/Tier 2 Patients Labs)  Once,   STAT    Question Answer Comment  Is this test for diagnosis or screening Screening   Symptomatic for COVID-19 as defined by CDC No   Hospitalized for COVID-19 No   Admitted to ICU for COVID-19 No   Previously tested for COVID-19 Yes   Resident in a congregate (group) care setting No   Employed in healthcare setting No      06/16/19 1537   Signed and Held  Basic metabolic panel  Tomorrow morning,   R     Signed and Held   Signed and Held  CBC  Tomorrow morning,   R     Signed and Held          Vitals/Pain Today's Vitals   06/16/19 1641 06/16/19 1700 06/16/19 1800 06/16/19 1900  BP:  (!) 153/79 (!) 157/93 (!) 158/103  Pulse:  92 90 90  Resp:  16 16 15   Temp:      TempSrc:      SpO2:  98% 97% 97%  Weight:      Height:      PainSc: 1        Isolation Precautions No  active isolations  Medications Medications  morphine 4 MG/ML injection 4 mg (4 mg Intravenous Given 06/16/19 1612)  phytonadione (VITAMIN K) tablet 2.5 mg (2.5 mg Oral Given 06/16/19 1704)    Mobility walks with person assist High fall risk   Focused Assessments    R Recommendations: See Admitting Provider Note  Report given to:   Additional Notes:

## 2019-06-17 LAB — GLUCOSE, CAPILLARY
Glucose-Capillary: 66 mg/dL — ABNORMAL LOW (ref 70–99)
Glucose-Capillary: 93 mg/dL (ref 70–99)
Glucose-Capillary: 147 mg/dL — ABNORMAL HIGH (ref 70–99)
Glucose-Capillary: 103 mg/dL — ABNORMAL HIGH (ref 70–99)
Glucose-Capillary: 214 mg/dL — ABNORMAL HIGH (ref 70–99)

## 2019-06-17 LAB — BASIC METABOLIC PANEL
Anion gap: 11 (ref 5–15)
BUN: 23 mg/dL — ABNORMAL HIGH (ref 6–20)
CO2: 22 mmol/L (ref 22–32)
Calcium: 8.3 mg/dL — ABNORMAL LOW (ref 8.9–10.3)
Chloride: 99 mmol/L (ref 98–111)
Creatinine, Ser: 4.82 mg/dL — ABNORMAL HIGH (ref 0.61–1.24)
GFR calc Af Amer: 14 mL/min — ABNORMAL LOW (ref >60)
GFR calc non Af Amer: 12 mL/min — ABNORMAL LOW (ref >60)
Glucose, Bld: 106 mg/dL — ABNORMAL HIGH (ref 70–99)
Potassium: 4.2 mmol/L (ref 3.5–5.1)
Sodium: 132 mmol/L — ABNORMAL LOW (ref 135–145)

## 2019-06-17 LAB — CBC
HCT: 31.4 % — ABNORMAL LOW (ref 39.0–52.0)
Hemoglobin: 10.1 g/dL — ABNORMAL LOW (ref 13.0–17.0)
MCH: 27.4 pg (ref 26.0–34.0)
MCHC: 32.2 g/dL (ref 30.0–36.0)
MCV: 85.1 fL (ref 80.0–100.0)
Platelets: 151 10*3/uL (ref 150–400)
RBC: 3.69 MIL/uL — ABNORMAL LOW (ref 4.22–5.81)
RDW: 21.3 % — ABNORMAL HIGH (ref 11.5–15.5)
WBC: 6.2 10*3/uL (ref 4.0–10.5)
nRBC: 0 % (ref 0.0–0.2)

## 2019-06-17 LAB — SARS CORONAVIRUS 2 (TAT 6-24 HRS): SARS Coronavirus 2: NEGATIVE

## 2019-06-17 LAB — PROTIME-INR
INR: 3.6 — ABNORMAL HIGH (ref 0.8–1.2)
Prothrombin Time: 35.3 seconds — ABNORMAL HIGH (ref 11.4–15.2)

## 2019-06-17 MED ORDER — WARFARIN - PHARMACIST DOSING INPATIENT
Freq: Every day | Status: DC
  Administered 2019-06-21: 18:00:00

## 2019-06-17 MED ORDER — CHLORHEXIDINE GLUCONATE CLOTH 2 % EX PADS
6.0000 | MEDICATED_PAD | Freq: Every day | CUTANEOUS | Status: DC
  Administered 2019-06-18 – 2019-06-27 (×8): 6 via TOPICAL

## 2019-06-17 NOTE — Progress Notes (Signed)
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 Codeine Nausea And Vomiting    Patient Measurements: Height: 5\' 11"  (180.3 cm) Weight: 227 lb 15.3 oz (103.4 kg) IBW/kg (Calculated) : 75.3  Vital Signs: Temp: 98.3 F (36.8 C) (09/09 0607) Temp Source: Oral (09/09 0607) BP: 127/73 (09/09 0607) Pulse Rate: 80 (09/09 0608)  Labs: Recent Labs    06/16/19 1453 06/16/19 1613 06/17/19 0242 06/17/19 0925  HGB 11.8*  --  10.1*  --   HCT 37.9*  --  31.4*  --   PLT 152  --  151  --   LABPROT  --  68.4*  --  35.3*  INR  --  8.4*  --  3.6*  CREATININE 4.38*  --  4.82*  --     Estimated Creatinine Clearance: 20.7 mL/min (A) (by C-G formula based on SCr of 4.82 mg/dL (H)).  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   INR 8.4 on admit 9/8 with right ankle fracture.  Vitamin K 2.5 mg PO given and INR down to 3.6 today.  Will need surgery per Orthopedics.    PTA Coumadin:  7.5 mg TTSS, none on non-dialysis days.  Last dose 9/7.  Confirmed with patient.  He reports some recent bleeding when coming off HD.   Goal of Therapy:  INR 2-3 Monitor platelets by anticoagulation protocol: Yes   Plan:   No Coumadin today.  Daily PT/INR.  Follow up Ortho plans.   Expect Coumadin to be held for surgery.  Arty Baumgartner, [place] Pager: [phone] or phone: [phone] 06/17/2019,12:52 PM

## 2019-06-17 NOTE — Progress Notes (Addendum)
Progress note    Steven [name] [id number] DOB: Nov 03, 1960 DOA: 06/16/2019  PCP: Maryruth Hancock, MD   Patient coming from: Home   Chief Complaint: Right ankle pain.   HPI: [name] is a 58 y.o. male with medical history significant of history of pulmonary embolism, alcohol abuse, ambulatory dysfunction, hypertension, COPD, type 2 diabetes mellitus and end-stage renal disease on hemodialysis. Patient uses a walker for ambulation.  Yesterday while trying to get bathroom he lost his balance while holding his walker and twisted his right ankle.  No head trauma no loss of consciousness.  Post trauma he had significant pain on his right ankle, sharp in nature, 10 out of 10 in intensity, worse with weightbearing, associated with local edema, no fevers, no chills.  No improving factors.  Yesterday he missed his hemodialysis session due to right ankle pain. Today he presented to the hospital due to persistent symptoms. In ED patient was diagnosed with right ankle fracture, orthopedics was consulted, recommendations for splinting, remain nonweightbearing to the right lower extremity and transferred to [facility], for further surgical evaluation.  Subjective: No acute issues or events overnight, pain currently well controlled, tolerating breakfast at bedside this morning, denies chest pain, shortness of breath, nausea, vomiting, diarrhea, constipation, headache, fevers, or chills.  Assessment/Plan Principal Problem:   Ankle fracture Active Problems:   Diabetes mellitus type 2 in obese (HCC)   Hyperlipidemia   Morbid obesity (HCC)   Essential hypertension   End stage renal disease (HCC)   COPD, mild (HCC)   Acute right ankle fracture after mechanical fall, POA   -Pain well controlled with IV morphine, splint has been placed in the emergency department.   -Continue to follow-up with orthopedic recommendations.   -Will need his coagulopathy to be corrected and patient to undergo  hemodialysis before surgical intervention -as below  Supratherapeutic INR, history of pulmonary embolism on warfarin.  INR elevated 8.4 at intake Improved to 3.6 currently  End-stage renal disease on hemodialysis MWF.  -Defer to nephrology  -Appears euvolemic, follow labs with dialysis  -Continue furosemide and spironolactone.   -Continue serum bicarbonate and PhosLo.  Hypertension, essential.   Continue home medications including metoprolol, furosemide and spironolactone.  Insulin-dependent type 2 diabetes mellitus.   -Continue sliding scale insulin, hypoglycemic protocol  -Continue to hold basal insulin for now while n.p.o. perioperatively  COPD, not in acute exacerbation.   -Continue home nebs, currently oxygenating well on room air  Depression.  -Continue mirtazapine.  DVT prophylaxis: warfarin -currently on hold due to supratherapeutic INR as above Code Status: full  Family Communication: no family at the bedside   Disposition Plan: Pending surgery and clinical improvement likely disposition to rehab versus home with home health Consults called: Orthopedics  Admission status: Inpatient.  Continues to require IV pain medication in the setting of acute ankle fracture, surgery tentatively planned in the next 24 to 48 hours.  Physical Exam: Vitals:   06/16/19 2155 06/17/19 0607 06/17/19 0608 06/17/19 1452  BP: (!) 150/81 127/73  116/63  Pulse: 100 82 80 71  Resp: 18 16  16   Temp: 98.1 F (36.7 C) 98.3 F (36.8 C)  97.7 F (36.5 C)  TempSrc: Oral Oral  Oral  SpO2: 98% 92% 94% 99%  Weight: 103.4 kg     Height: 5\' 11"  (1.803 m)       Vitals:   06/16/19 2155 06/17/19 0607 06/17/19 0608 06/17/19 1452  BP: (!) 150/81 127/73  116/63  Pulse:  100 82 80 71  Resp: 18 16  16   Temp: 98.1 F (36.7 C) 98.3 F (36.8 C)  97.7 F (36.5 C)  TempSrc: Oral Oral  Oral  SpO2: 98% 92% 94% 99%  Weight: 103.4 kg     Height: 5\' 11"  (1.803 m)      General: Not in pain or dyspnea,  deconditioned  Neurology: Awake and alert, non focal Head and Neck. Head normocephalic. Neck supple with no adenopathy or thyromegaly.   E ENT: mild pallor, no icterus, oral mucosa moist Cardiovascular: No JVD. S1-S2 present, rhythmic, no gallops, rubs, or murmurs. No lower extremity edema. Pulmonary:  Positive breath sounds bilaterally, adequate air movement, no wheezing, rhonchi or rales. Gastrointestinal. Abdomen with no organomegaly, non tender, no rebound or guarding Skin. No rashes Musculoskeletal: right ankle with splint in place.   Labs on Admission: I have personally reviewed following labs and imaging studies  CBC: Recent Labs  Lab 06/16/19 1453 06/17/19 0242  WBC 8.6 6.2  NEUTROABS 7.3  --   HGB 11.8* 10.1*  HCT 37.9* 31.4*  MCV 86.5 85.1  PLT 152 [id number]   Basic Metabolic Panel: Recent Labs  Lab 06/16/19 1453 06/17/19 0242  NA 132* 132*  K 4.9 4.2  CL 97* 99  CO2 26 22  GLUCOSE 81 106*  BUN 23* 23*  CREATININE 4.38* 4.82*  CALCIUM 8.7* 8.3*   GFR: Estimated Creatinine Clearance: 20.7 mL/min (A) (by C-G formula based on SCr of 4.82 mg/dL (H)). Liver Function Tests: Recent Labs  Lab 06/16/19 1453  AST 20  ALT 17  ALKPHOS 126  BILITOT 0.9  PROT 6.2*  ALBUMIN 2.9*   No results for input(s): LIPASE, AMYLASE in the last 168 hours. No results for input(s): AMMONIA in the last 168 hours. Coagulation Profile: Recent Labs  Lab 06/16/19 1613 06/17/19 0925  INR 8.4* 3.6*   CBG: Recent Labs  Lab 06/16/19 1945 06/16/19 2205 06/17/19 0815 06/17/19 0849 06/17/19 1209  GLUCAP 72 125* 66* 93 147*   Urine analysis:    Component Value Date/Time   COLORURINE RED (A) 02/11/2019 1336   APPEARANCEUR HAZY (A) 02/11/2019 1336   LABSPEC 1.010 02/11/2019 1336   PHURINE 7.0 02/11/2019 1336   GLUCOSEU 250 (A) 02/11/2019 1336   HGBUR LARGE (A) 02/11/2019 1336   BILIRUBINUR NEGATIVE 02/11/2019 1336   KETONESUR TRACE (A) 02/11/2019 1336   PROTEINUR >300 (A)  02/11/2019 1336   NITRITE POSITIVE (A) 02/11/2019 1336   LEUKOCYTESUR SMALL (A) 02/11/2019 1336    Radiological Exams on Admission: Dg Ankle Complete Right  Result Date: 06/16/2019 CLINICAL DATA:  Fall 2 days ago with persistent ankle pain and swelling, initial encounter EXAM: RIGHT ANKLE - COMPLETE 3+ VIEW COMPARISON:  None. FINDINGS: Oblique fracture through the distal tibial metaphysis is noted with mild displacement at the fracture site. Prior callus formation in the distal fibula is noted related to prior fracture. No other fracture is identified. IMPRESSION: Oblique fracture through the distal tibial metaphysis. Electronically Signed   By: Inez Catalina M.D.   On: 06/16/2019 14:29   Ct Head Wo Contrast  Result Date: 06/16/2019 CLINICAL DATA:  Status post fall.  Anticoagulation. EXAM: CT HEAD WITHOUT CONTRAST TECHNIQUE: Contiguous axial images were obtained from the base of the skull through the vertex without intravenous contrast. COMPARISON:  02/12/2019 FINDINGS: Brain: No evidence of acute infarction, hemorrhage, hydrocephalus, extra-axial collection or mass lesion/mass effect. There is mild diffuse low-attenuation within the subcortical and periventricular white matter compatible with  chronic microvascular disease. Prominence of sulci and ventricles compatible with brain atrophy. Vascular: No hyperdense vessel or unexpected calcification. Skull: Normal. Negative for fracture or focal lesion. Sinuses/Orbits: No acute finding. Other: None. IMPRESSION: 1. No acute intracranial abnormality 2. Chronic small vessel ischemic change and brain atrophy. Electronically Signed   By: Kerby Moors M.D.   On: 06/16/2019 18:35   Ct Ankle Right Wo Contrast  Result Date: 06/16/2019 CLINICAL DATA:  Evaluate ankle fractures. EXAM: CT OF THE RIGHT ANKLE WITHOUT CONTRAST TECHNIQUE: Multidetector CT imaging of the right ankle was performed according to the standard protocol. Multiplanar CT image reconstructions were  also generated. COMPARISON:  Radiographs 06/16/2019 FINDINGS: Remote appearing ununited oblique coursing distal tibia fracture. Extensive bony resorptive changes along the fracture margins but no osseous bridging. There is callus formation noted along the lateral aspect of the tibial cortex and some bony bridging across the upper lateral fracture site. Maximum of 8 mm of separation along the medial aspect of the fracture and no callus formation. There could be acute on chronic fractures. There is a largely healed distal fibular fracture with fairly extensive callus formation. The ankle mortise is maintained. No fractures of the talus or calcaneus. No ankle joint effusion. The subtalar joints are maintained. Extensive small vessel disease with extensive calcifications. IMPRESSION: 1. Remote appearing ununited distal tibia fractures. Possible acute on chronic fractures. 2. Healed distal fibular fracture. 3. Intact ankle mortise. No mid or hindfoot fractures are identified. 4. Extensive small vessel disease. Electronically Signed   By: Marijo Sanes M.D.   On: 06/16/2019 18:56    EKG: Independently reviewed.  98 bpm, normal axis, normal intervals, sinus rhythm, no ST segment changes, no T wave changes.  Little Ishikawa DO [facility]  06/17/2019, 4:27 PM

## 2019-06-18 LAB — CBC
HCT: 30.6 % — ABNORMAL LOW (ref 39.0–52.0)
Hemoglobin: 9.7 g/dL — ABNORMAL LOW (ref 13.0–17.0)
MCH: 27.4 pg (ref 26.0–34.0)
MCHC: 31.7 g/dL (ref 30.0–36.0)
MCV: 86.4 fL (ref 80.0–100.0)
Platelets: 143 10*3/uL — ABNORMAL LOW (ref 150–400)
RBC: 3.54 MIL/uL — ABNORMAL LOW (ref 4.22–5.81)
RDW: 21.2 % — ABNORMAL HIGH (ref 11.5–15.5)
WBC: 5.7 10*3/uL (ref 4.0–10.5)
nRBC: 0 % (ref 0.0–0.2)

## 2019-06-18 LAB — GLUCOSE, CAPILLARY
Glucose-Capillary: 114 mg/dL — ABNORMAL HIGH (ref 70–99)
Glucose-Capillary: 295 mg/dL — ABNORMAL HIGH (ref 70–99)
Glucose-Capillary: 186 mg/dL — ABNORMAL HIGH (ref 70–99)

## 2019-06-18 LAB — HEPATITIS B SURFACE ANTIGEN: Hepatitis B Surface Ag: NEGATIVE

## 2019-06-18 LAB — PROTIME-INR
INR: 3.7 — ABNORMAL HIGH (ref 0.8–1.2)
INR: 3.2 — ABNORMAL HIGH (ref 0.8–1.2)
Prothrombin Time: 35.8 seconds — ABNORMAL HIGH (ref 11.4–15.2)
Prothrombin Time: 32.2 seconds — ABNORMAL HIGH (ref 11.4–15.2)

## 2019-06-18 LAB — ABO/RH: ABO/RH(D): O POS

## 2019-06-18 MED ORDER — PENTAFLUOROPROP-TETRAFLUOROETH EX AERO: 1.0000 "application " | INHALATION_SPRAY | CUTANEOUS | Status: DC | PRN

## 2019-06-18 MED ORDER — HEPARIN SODIUM (PORCINE) 1000 UNIT/ML DIALYSIS: 3000.0000 [IU] | INTRAMUSCULAR | Status: DC | PRN

## 2019-06-18 MED ORDER — HEPARIN SODIUM (PORCINE) 1000 UNIT/ML DIALYSIS: 1000.0000 [IU] | INTRAMUSCULAR | Status: DC | PRN

## 2019-06-18 MED ORDER — SODIUM CHLORIDE 0.9 % IV SOLN: 100.0000 mL | INTRAVENOUS | Status: DC | PRN

## 2019-06-18 MED ORDER — ALTEPLASE 2 MG IJ SOLR: 2.0000 mg | Freq: Once | INTRAMUSCULAR | Status: DC | PRN

## 2019-06-18 MED ORDER — LIDOCAINE-PRILOCAINE 2.5-2.5 % EX CREA: 1.0000 "application " | TOPICAL_CREAM | CUTANEOUS | Status: DC | PRN

## 2019-06-18 MED ORDER — SODIUM CHLORIDE 0.9% IV SOLUTION: Freq: Once | INTRAVENOUS | Status: DC

## 2019-06-18 MED ORDER — CEFAZOLIN SODIUM-DEXTROSE 1-4 GM/50ML-% IV SOLN
1.0000 g | INTRAVENOUS | Status: AC
  Administered 2019-06-19: 1 g via INTRAVENOUS

## 2019-06-18 MED ORDER — LIDOCAINE HCL (PF) 1 % IJ SOLN: 5.0000 mL | INTRAMUSCULAR | Status: DC | PRN

## 2019-06-18 NOTE — Progress Notes (Signed)
Progress note    Steven [name] [id number] DOB: 1960-11-24 DOA: 06/16/2019  PCP: Maryruth Hancock, MD   Patient coming from: Home   Chief Complaint: Right ankle pain.   HPI: Steven [name] is a 58 y.o. male with medical history significant of history of pulmonary embolism, alcohol abuse, ambulatory dysfunction, hypertension, COPD, type 2 diabetes mellitus and end-stage renal disease on hemodialysis. Patient uses a walker for ambulation.  Yesterday while trying to get bathroom he lost his balance while holding his walker and twisted his right ankle.  No head trauma no loss of consciousness.  Post trauma he had significant pain on his right ankle, sharp in nature, 10 out of 10 in intensity, worse with weightbearing, associated with local edema, no fevers, no chills.  No improving factors.  Yesterday he missed his hemodialysis session due to right ankle pain. Today he presented to the hospital due to persistent symptoms. In ED patient was diagnosed with right ankle fracture, orthopedics was consulted, recommendations for splinting, remain nonweightbearing to the right lower extremity and transferred to [facility], for further surgical evaluation.  Subjective: No acute issues or events overnight, pain currently well controlled, tolerating breakfast at bedside this morning, denies chest pain, shortness of breath, nausea, vomiting, diarrhea, constipation, headache, fevers, or chills.  Assessment/Plan Principal Problem:   Ankle fracture Active Problems:   Diabetes mellitus type 2 in obese (HCC)   Hyperlipidemia   Morbid obesity (HCC)   Essential hypertension   End stage renal disease (HCC)   COPD, mild (HCC)   Acute right ankle fracture after mechanical fall, POA   -Pain well controlled with IV morphine, splint has been placed in the emergency department.   -Continue to follow-up with orthopedic recommendations -pending external versus internal fixation -INR improving as below, tolerating  dialysis well as below  Supratherapeutic INR, history of pulmonary embolism on warfarin, resolving.  INR elevated 8.4 at intake Improved to 3.7 currently -defer to orthopedic surgery for INR goal Continue to hold warfarin, pending surgical schedule could give vitamin K preoperatively if INR still somewhat elevated  End-stage renal disease on hemodialysis MWF.  -Defer to nephrology  -Appears euvolemic, follow labs with dialysis  -Continue furosemide and spironolactone.   -Continue serum bicarbonate and PhosLo.  Hypertension, essential.   Continue home medications including metoprolol, furosemide and spironolactone.  Insulin-dependent type 2 diabetes mellitus.   -Continue sliding scale insulin, hypoglycemic protocol  -Continue to hold basal insulin for now while n.p.o. perioperatively  COPD, not in acute exacerbation.   -Continue home nebs, currently oxygenating well on room air  Depression.  -Continue mirtazapine.  DVT prophylaxis: warfarin -currently on hold due to supratherapeutic INR as above and need for surgery Code Status: full  Family Communication: no family at the bedside   Disposition Plan: Pending surgery and clinical improvement likely disposition to rehab versus home with home health Consults called: Orthopedics  Admission status: Inpatient.  Continues to require IV pain medication in the setting of acute ankle fracture, surgery tentatively planned in the next 24 to 48 hours.  Physical Exam: Vitals:   06/18/19 0702 06/18/19 0733 06/18/19 0757 06/18/19 0831  BP: 124/66 (!) 112/59 (!) 99/49 (!) 93/43  Pulse: 64 63 65 72  Resp:      Temp:      TempSrc:      SpO2:      Weight:      Height:        Vitals:   06/18/19 0702 06/18/19 [id number]  06/18/19 0757 06/18/19 0831  BP: 124/66 (!) 112/59 (!) 99/49 (!) 93/43  Pulse: 64 63 65 72  Resp:      Temp:      TempSrc:      SpO2:      Weight:      Height:       General: Not in pain or dyspnea, deconditioned   Neurology: Awake and alert, non focal Head and Neck. Head normocephalic. Neck supple with no adenopathy or thyromegaly.   E ENT: mild pallor, no icterus, oral mucosa moist Cardiovascular: No JVD. S1-S2 present, rhythmic, no gallops, rubs, or murmurs. No lower extremity edema. Pulmonary:  Positive breath sounds bilaterally, adequate air movement, no wheezing, rhonchi or rales. Gastrointestinal. Abdomen with no organomegaly, non tender, no rebound or guarding Skin. No rashes Musculoskeletal: right ankle with splint in place.   Labs on Admission: I have personally reviewed following labs and imaging studies  CBC: Recent Labs  Lab 06/16/19 1453 06/17/19 0242 06/18/19 0121  WBC 8.6 6.2 5.7  NEUTROABS 7.3  --   --   HGB 11.8* 10.1* 9.7*  HCT 37.9* 31.4* 30.6*  MCV 86.5 85.1 86.4  PLT 152 151 [id number]*   Basic Metabolic Panel: Recent Labs  Lab 06/16/19 1453 06/17/19 0242  NA 132* 132*  K 4.9 4.2  CL 97* 99  CO2 26 22  GLUCOSE 81 106*  BUN 23* 23*  CREATININE 4.38* 4.82*  CALCIUM 8.7* 8.3*   GFR: Estimated Creatinine Clearance: 20.9 mL/min (A) (by C-G formula based on SCr of 4.82 mg/dL (H)). Liver Function Tests: Recent Labs  Lab 06/16/19 1453  AST 20  ALT 17  ALKPHOS 126  BILITOT 0.9  PROT 6.2*  ALBUMIN 2.9*   Coagulation Profile: Recent Labs  Lab 06/16/19 1613 06/17/19 0925 06/18/19 0121  INR 8.4* 3.6* 3.7*   CBG: Recent Labs  Lab 06/17/19 0815 06/17/19 0849 06/17/19 1209 06/17/19 1647 06/17/19 2132  GLUCAP 66* 93 147* 103* 214*   Urine analysis:    Component Value Date/Time   COLORURINE RED (A) 02/11/2019 1336   APPEARANCEUR HAZY (A) 02/11/2019 1336   LABSPEC 1.010 02/11/2019 1336   PHURINE 7.0 02/11/2019 1336   GLUCOSEU 250 (A) 02/11/2019 1336   HGBUR LARGE (A) 02/11/2019 1336   BILIRUBINUR NEGATIVE 02/11/2019 1336   KETONESUR TRACE (A) 02/11/2019 1336   PROTEINUR >300 (A) 02/11/2019 1336   NITRITE POSITIVE (A) 02/11/2019 1336   LEUKOCYTESUR  SMALL (A) 02/11/2019 1336    Radiological Exams on Admission: Dg Ankle Complete Right  Result Date: 06/16/2019 CLINICAL DATA:  Fall 2 days ago with persistent ankle pain and swelling, initial encounter EXAM: RIGHT ANKLE - COMPLETE 3+ VIEW COMPARISON:  None. FINDINGS: Oblique fracture through the distal tibial metaphysis is noted with mild displacement at the fracture site. Prior callus formation in the distal fibula is noted related to prior fracture. No other fracture is identified. IMPRESSION: Oblique fracture through the distal tibial metaphysis. Electronically Signed   By: Inez Catalina M.D.   On: 06/16/2019 14:29   Ct Head Wo Contrast  Result Date: 06/16/2019 CLINICAL DATA:  Status post fall.  Anticoagulation. EXAM: CT HEAD WITHOUT CONTRAST TECHNIQUE: Contiguous axial images were obtained from the base of the skull through the vertex without intravenous contrast. COMPARISON:  02/12/2019 FINDINGS: Brain: No evidence of acute infarction, hemorrhage, hydrocephalus, extra-axial collection or mass lesion/mass effect. There is mild diffuse low-attenuation within the subcortical and periventricular white matter compatible with chronic microvascular disease. Prominence of sulci  and ventricles compatible with brain atrophy. Vascular: No hyperdense vessel or unexpected calcification. Skull: Normal. Negative for fracture or focal lesion. Sinuses/Orbits: No acute finding. Other: None. IMPRESSION: 1. No acute intracranial abnormality 2. Chronic small vessel ischemic change and brain atrophy. Electronically Signed   By: Kerby Moors M.D.   On: 06/16/2019 18:35   Ct Ankle Right Wo Contrast  Result Date: 06/16/2019 CLINICAL DATA:  Evaluate ankle fractures. EXAM: CT OF THE RIGHT ANKLE WITHOUT CONTRAST TECHNIQUE: Multidetector CT imaging of the right ankle was performed according to the standard protocol. Multiplanar CT image reconstructions were also generated. COMPARISON:  Radiographs 06/16/2019 FINDINGS: Remote  appearing ununited oblique coursing distal tibia fracture. Extensive bony resorptive changes along the fracture margins but no osseous bridging. There is callus formation noted along the lateral aspect of the tibial cortex and some bony bridging across the upper lateral fracture site. Maximum of 8 mm of separation along the medial aspect of the fracture and no callus formation. There could be acute on chronic fractures. There is a largely healed distal fibular fracture with fairly extensive callus formation. The ankle mortise is maintained. No fractures of the talus or calcaneus. No ankle joint effusion. The subtalar joints are maintained. Extensive small vessel disease with extensive calcifications. IMPRESSION: 1. Remote appearing ununited distal tibia fractures. Possible acute on chronic fractures. 2. Healed distal fibular fracture. 3. Intact ankle mortise. No mid or hindfoot fractures are identified. 4. Extensive small vessel disease. Electronically Signed   By: Marijo Sanes M.D.   On: 06/16/2019 18:56   Time spent: 35 minutes  Little Ishikawa DO [facility]  06/18/2019, 8:47 AM

## 2019-06-18 NOTE — Progress Notes (Addendum)
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

## 2019-06-18 NOTE — Procedures (Signed)
Patient seen on Hemodialysis. BP (!) 83/44 (BP Location: Left Arm)   Pulse 69   Temp 98 F (36.7 C) (Oral)   Resp 16   Ht 5\' 11"  (1.803 m)   Wt 105.7 kg   SpO2 95%   BMI 32.50 kg/m   QB 350, UF goal 2.5L Tolerating treatment without complaints at this time.   Elmarie Shiley MD [facility]. Office # [phone] Pager # [phone] 10:27 AM

## 2019-06-18 NOTE — Consult Note (Signed)
Orthopaedic Trauma Service (OTS) Consult   Patient ID: [name] MRN: [id number] DOB/AGE: 07-Apr-1961 58 y.o.   Reason for Consult: Right Distal tibia fracture Referring Physician: Victorino December MD (ortho)   HPI: Steven [name] is an 58 y.o. white male with complex medical history notable for history of PE on chronic anticoagulation, CHF, COPD, end-stage renal disease on dialysis amongst many other issues along with peripheral neuropathy who reportedly had a ground-level fall 2 days ago resulting in a right distal tibia fracture.  Patient lives alone in a house in [place].  He was eventually found by his neighbor and was brought to the hospital by EMS.  Patient was found to have a right extra-articular distal tibia fracture.  Orthopedics was consulted.  Given the complexity of the injury as well as complexity of the patient the orthopedic trauma service was consulted for definitive management.  Patient seen and evaluated by the orthopedic trauma service in the dialysis unit.  He does report some mild pain in his right leg but again has baseline peripheral neuropathy and has very restricted sensation up until about mid lower leg.  States that the peripheral neuropathy is worse on the right compared to the left.  He does use a walker at baseline to provide balance.  States that he does not drink that often just several times a week.  History of alcohol abuse is noted.  He is on dialysis Monday Wednesdays and Fridays.  He does have family that lives in the [place] area, sister  History of upper extremity fractures in the past, treated non-op   History of right foot diabetic ulcer treated with serial debridements and wound VAC.  Treated by podiatry 2018 at [facility]  Patient denies any additional falls in the last several weeks.  States that he had a stroke about 6 months ago and may have fallen at that time.  Last fall nuclear membranes was  about a year and a half ago.  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)    Alcohol abuse    Anemia    Asthma    Cellulitis and abscess of right lower extremity 03/01/2017   CHF (congestive heart failure) (HCC)    Chronic kidney disease    dialysis m,w,f   Cognitive communication deficit    COPD (chronic obstructive pulmonary disease) ([place])    Coronary artery disease    Diabetes mellitus without complication ([place])    Edema extremities 03/01/2017   bilateral swelling   GERD (gastroesophageal reflux disease)    High cholesterol    High triglycerides    Hyperlipemia    Hypertension    Hypothyroidism    Major depressive disorder    Muscle weakness    Neuropathy    OSA (obstructive sleep apnea)    Other abnormalities of gait and mobility    Peripheral vascular disease ([place])    Pneumonia    Sleep apnea    can't use cpap d/t feelings of suffocation    Past Surgical History:  Procedure Laterality Date   A/V FISTULAGRAM Right 08/27/2017   Procedure: A/V FISTULAGRAM;  Surgeon: Katha Cabal, MD;  Location: [place] [facility];  Service: Cardiovascular;  Laterality: Right;   A/V FISTULAGRAM Right 11/04/2018   Procedure: A/V FISTULAGRAM;  Surgeon: Katha Cabal, MD;  Location: [place] [facility];  Service: Cardiovascular;  Laterality: Right;  A/V SHUNT INTERVENTION N/A 06/23/2018   Procedure: A/V SHUNT INTERVENTION;  Surgeon: Algernon Huxley, MD;  Location: [place] [facility];  Service: Cardiovascular;  Laterality: N/A;   APPENDECTOMY  2010   AV FISTULA PLACEMENT Right 06/14/2017   Procedure: ARTERIOVENOUS (AV) FISTULA CREATION WRIST;  Surgeon: Katha Cabal, MD;  Location: [facility];  Service: Vascular;  Laterality: Right;   DIALYSIS/PERMA CATHETER INSERTION N/A 03/05/2017   Procedure: Dialysis/Perma Catheter Insertion;  Surgeon: Katha Cabal, MD;  Location: [place] [facility];  Service: Cardiovascular;  Laterality: N/A;   DIALYSIS/PERMA CATHETER INSERTION N/A 09/20/2017   Procedure: DIALYSIS/PERMA CATHETER INSERTION;  Surgeon: Katha Cabal, MD;  Location: [place] [facility];  Service: Cardiovascular;  Laterality: N/A;   DIALYSIS/PERMA CATHETER INSERTION N/A 01/26/2019   Procedure: DIALYSIS/PERMA CATHETER INSERTION/Exchange;  Surgeon: Algernon Huxley, MD;  Location: [place] [facility];  Service: Cardiovascular;  Laterality: N/A;   DIALYSIS/PERMA CATHETER INSERTION N/A 01/27/2019   Procedure: DIALYSIS/PERMA CATHETER INSERTION;  Surgeon: Katha Cabal, MD;  Location: [place] [facility];  Service: Cardiovascular;  Laterality: N/A;   EXCHANGE OF A DIALYSIS CATHETER  03/29/2017   Procedure: Exchange Of A Dialysis Catheter;  Surgeon: Katha Cabal, MD;  Location: [place] [facility];  Service: Cardiovascular;;   IRRIGATION AND DEBRIDEMENT FOOT Right 03/01/2017   Procedure: IRRIGATION AND DEBRIDEMENT FOOT;  Surgeon: Albertine Patricia, DPM;  Location: [facility];  Service: Podiatry;  Laterality: Right;  application of wound vac   PERIPHERAL VASCULAR THROMBECTOMY Right 06/18/2018   Procedure: PERIPHERAL VASCULAR THROMBECTOMY;  Surgeon: Algernon Huxley, MD;  Location: [place] [facility];  Service: Cardiovascular;  Laterality: Right;   VASCULAR ACCESS DEVICE INSERTION Right 01/30/2019   Procedure: INSERTION OF HERO VASCULAR ACCESS DEVICE;  Surgeon: Katha Cabal, MD;  Location: [facility];  Service: Vascular;  Laterality: Right;    Family History  Problem Relation Age of Onset   CAD Father    Heart disease Father     Social History:  reports that he has quit smoking. His smokeless tobacco use includes snuff. He reports current alcohol use. He reports that he does not use drugs.  Allergies:  Allergies  Allergen Reactions   Codeine Nausea And Vomiting    Medications: I have reviewed the patient's current medications. Current Meds  Medication  Sig   calcium acetate (PHOSLO) 667 MG capsule Take 2 capsules (1,334 mg total) by mouth 3 (three) times daily with meals. (Patient taking differently: Take 2,001 mg by mouth 3 (three) times daily with meals. )   furosemide (LASIX) 80 MG tablet Take 80 mg by mouth daily.   gabapentin (NEURONTIN) 300 MG capsule Take 300 mg by mouth at bedtime.   insulin aspart (NOVOLOG) 100 UNIT/ML injection Inject 6 Units into the skin 3 (three) times daily with meals. (Patient taking differently: Inject 3-15 Units into the skin 3 (three) times daily with meals. Per sliding scale)   insulin detemir (LEVEMIR) 100 UNIT/ML injection Inject 0.09 mLs (9 Units total) into the skin daily.   metoprolol tartrate (LOPRESSOR) 25 MG tablet Take 0.5 tablets (12.5 mg total) by mouth 2 (two) times daily.   midodrine (PROAMATINE) 10 MG tablet Take 1 tablet (10 mg total) by mouth 2 (two) times daily with a meal. (Patient taking differently: Take 10 mg by mouth daily as needed (for low bp from dialysis). )   mirtazapine (REMERON) 15 MG tablet Take 15 mg by mouth at bedtime as needed.  mometasone-formoterol (DULERA) 100-5 MCG/ACT AERO Inhale 2 puffs into the lungs every morning. (Patient taking differently: Inhale 2 puffs into the lungs daily as needed for wheezing or shortness of breath. )   omeprazole (PRILOSEC OTC) 20 MG tablet Take 1 tablet (20 mg total) by mouth daily.   PROAIR HFA 108 (90 Base) MCG/ACT inhaler Inhale 2 puffs into the lungs every 4 (four) hours as needed for wheezing or shortness of breath.    sodium bicarbonate 650 MG tablet Take 1 tablet (650 mg total) by mouth 2 (two) times daily. (Patient taking differently: Take 650 mg by mouth daily. )   spironolactone (ALDACTONE) 25 MG tablet Take 25 mg by mouth daily.   warfarin (COUMADIN) 5 MG tablet Take 7.5 mg by mouth See admin instructions. Take 7.5mg  on non-dialysis days (Tuesdays, Thursdays, Saturdays, and Sundays) Do not take on non-dialysis days.      Results for orders placed or performed during the hospital encounter of 06/16/19 (from the past 48 hour(s))  CBG monitoring, ED     Status: None   Collection Time: 06/16/19  2:03 PM  Result Value Ref Range   Glucose-Capillary 77 70 - 99 mg/dL  CBC with Differential     Status: Abnormal   Collection Time: 06/16/19  2:53 PM  Result Value Ref Range   WBC 8.6 4.0 - 10.5 K/uL   RBC 4.38 4.22 - 5.81 MIL/uL   Hemoglobin 11.8 (L) 13.0 - 17.0 g/dL   HCT 37.9 (L) 39.0 - 52.0 %   MCV 86.5 80.0 - 100.0 fL   MCH 26.9 26.0 - 34.0 pg   MCHC 31.1 30.0 - 36.0 g/dL   RDW 21.3 (H) 11.5 - 15.5 %   Platelets 152 150 - 400 K/uL   nRBC 0.0 0.0 - 0.2 %   Neutrophils Relative % 85 %   Neutro Abs 7.3 1.7 - 7.7 K/uL   Lymphocytes Relative 7 %   Lymphs Abs 0.6 (L) 0.7 - 4.0 K/uL   Monocytes Relative 7 %   Monocytes Absolute 0.6 0.1 - 1.0 K/uL   Eosinophils Relative 0 %   Eosinophils Absolute 0.0 0.0 - 0.5 K/uL   Basophils Relative 0 %   Basophils Absolute 0.0 0.0 - 0.1 K/uL   Immature Granulocytes 1 %   Abs Immature Granulocytes 0.05 0.00 - 0.07 K/uL    Comment: Performed at [facility], [address] [place], [place] [address]  Comprehensive metabolic panel     Status: Abnormal   Collection Time: 06/16/19  2:53 PM  Result Value Ref Range   Sodium 132 (L) 135 - 145 mmol/L   Potassium 4.9 3.5 - 5.1 mmol/L   Chloride 97 (L) 98 - 111 mmol/L   CO2 26 22 - 32 mmol/L   Glucose, Bld 81 70 - 99 mg/dL   BUN 23 (H) 6 - 20 mg/dL   Creatinine, Ser 4.38 (H) 0.61 - 1.24 mg/dL   Calcium 8.7 (L) 8.9 - 10.3 mg/dL   Total Protein 6.2 (L) 6.5 - 8.1 g/dL   Albumin 2.9 (L) 3.5 - 5.0 g/dL   AST 20 15 - 41 U/L   ALT 17 0 - 44 U/L   Alkaline Phosphatase 126 38 - 126 U/L   Total Bilirubin 0.9 0.3 - 1.2 mg/dL   GFR calc non Af Amer 14 (L) >60 mL/min   GFR calc Af Amer 16 (L) >60 mL/min   Anion gap 9 5 - 15    Comment: Performed at [facility], [address] [place], [place] [address]  Ethanol     Status:  None   Collection Time: 06/16/19  2:53 PM  Result Value Ref Range   Alcohol, Ethyl (B) <10 <10 mg/dL    Comment: (NOTE) Lowest detectable limit for serum alcohol is 10 mg/dL. For medical purposes only. Performed at [facility], [address] [place], [place] [address]   SARS CORONAVIRUS 2 (TAT 6-24 HRS) Nasopharyngeal Nasopharyngeal Swab     Status: None   Collection Time: 06/16/19  3:36 PM   Specimen: Nasopharyngeal Swab  Result Value Ref Range   SARS Coronavirus 2 NEGATIVE NEGATIVE    Comment: (NOTE) SARS-CoV-2 target nucleic acids are NOT DETECTED. The SARS-CoV-2 RNA is generally detectable in upper and lower respiratory specimens during the acute phase of infection. Negative results do not preclude SARS-CoV-2 infection, do not rule out co-infections with other pathogens, and should not be used as the sole basis for treatment or other patient management decisions. Negative results must be combined with clinical observations, patient history, and epidemiological information. The expected result is Negative. Fact Sheet for Patients: SugarRoll.be Fact Sheet for Healthcare Providers: https://www.woods-mathews.com/ This test is not yet approved or cleared by the [place] FDA and  has been authorized for detection and/or diagnosis of SARS-CoV-2 by FDA under an Emergency Use Authorization (EUA). This EUA will remain  in effect (meaning this test can be used) for the duration of the COVID-19 declaration under Section 56 4(b)(1) of the Act, 21 U.S.C. section 360bbb-3(b)(1), unless the authorization is terminated or revoked sooner. Performed at [facility], [place] [address], [place], [place] [address]   Protime-INR     Status: Abnormal   Collection Time: 06/16/19  4:13 PM  Result Value Ref Range   Prothrombin Time 68.4 (H) 11.4 - 15.2 seconds   INR 8.4 (HH) 0.8 - 1.2    Comment: RESULT REPEATED AND VERIFIED CRITICAL RESULT  CALLED TO, READ BACK BY AND VERIFIED WITH: KEMP,C@[id number]  BY MATTHEWS, B 9.8.2020 (NOTE) INR goal varies based on device and disease states. Performed at [facility], [address] [place], [place] [address]   POC CBG, ED     Status: None   Collection Time: 06/16/19  7:45 PM  Result Value Ref Range   Glucose-Capillary 72 70 - 99 mg/dL  Glucose, capillary     Status: Abnormal   Collection Time: 06/16/19 10:05 PM  Result Value Ref Range   Glucose-Capillary 125 (H) 70 - 99 mg/dL  Basic metabolic panel     Status: Abnormal   Collection Time: 06/17/19  2:42 AM  Result Value Ref Range   Sodium 132 (L) 135 - 145 mmol/L   Potassium 4.2 3.5 - 5.1 mmol/L   Chloride 99 98 - 111 mmol/L   CO2 22 22 - 32 mmol/L   Glucose, Bld 106 (H) 70 - 99 mg/dL   BUN 23 (H) 6 - 20 mg/dL   Creatinine, Ser 4.82 (H) 0.61 - 1.24 mg/dL   Calcium 8.3 (L) 8.9 - 10.3 mg/dL   GFR calc non Af Amer 12 (L) >60 mL/min   GFR calc Af Amer 14 (L) >60 mL/min   Anion gap 11 5 - 15    Comment: Performed at [facility], [place] [address] [place],  [address]  CBC     Status: Abnormal   Collection Time: 06/17/19  2:42 AM  Result Value Ref Range   WBC 6.2 4.0 - 10.5 K/uL  RBC 3.69 (L) 4.22 - 5.81 MIL/uL   Hemoglobin 10.1 (L) 13.0 - 17.0 g/dL   HCT 31.4 (L) 39.0 - 52.0 %   MCV 85.1 80.0 - 100.0 fL   MCH 27.4 26.0 - 34.0 pg   MCHC 32.2 30.0 - 36.0 g/dL   RDW 21.3 (H) 11.5 - 15.5 %   Platelets 151 150 - 400 K/uL   nRBC 0.0 0.0 - 0.2 %    Comment: Performed at [facility] [address], [place], [place] [address]  Glucose, capillary     Status: Abnormal   Collection Time: 06/17/19  8:15 AM  Result Value Ref Range   Glucose-Capillary 66 (L) 70 - 99 mg/dL  Glucose, capillary     Status: None   Collection Time: 06/17/19  8:49 AM  Result Value Ref Range   Glucose-Capillary 93 70 - 99 mg/dL  Protime-INR     Status: Abnormal   Collection Time: 06/17/19  9:25 AM  Result Value Ref Range   Prothrombin  Time 35.3 (H) 11.4 - 15.2 seconds   INR 3.6 (H) 0.8 - 1.2    Comment: (NOTE) INR goal varies based on device and disease states. Performed at [facility], [place] [address], [place], [place] [address]   Glucose, capillary     Status: Abnormal   Collection Time: 06/17/19 12:09 PM  Result Value Ref Range   Glucose-Capillary 147 (H) 70 - 99 mg/dL  Glucose, capillary     Status: Abnormal   Collection Time: 06/17/19  4:47 PM  Result Value Ref Range   Glucose-Capillary 103 (H) 70 - 99 mg/dL  Glucose, capillary     Status: Abnormal   Collection Time: 06/17/19  9:32 PM  Result Value Ref Range   Glucose-Capillary 214 (H) 70 - 99 mg/dL  Protime-INR     Status: Abnormal   Collection Time: 06/18/19  1:21 AM  Result Value Ref Range   Prothrombin Time 35.8 (H) 11.4 - 15.2 seconds   INR 3.7 (H) 0.8 - 1.2    Comment: (NOTE) INR goal varies based on device and disease states. Performed at [facility], [place] [address], [place], [place] [address]   CBC     Status: Abnormal   Collection Time: 06/18/19  1:21 AM  Result Value Ref Range   WBC 5.7 4.0 - 10.5 K/uL   RBC 3.54 (L) 4.22 - 5.81 MIL/uL   Hemoglobin 9.7 (L) 13.0 - 17.0 g/dL   HCT 30.6 (L) 39.0 - 52.0 %   MCV 86.4 80.0 - 100.0 fL   MCH 27.4 26.0 - 34.0 pg   MCHC 31.7 30.0 - 36.0 g/dL   RDW 21.2 (H) 11.5 - 15.5 %   Platelets 143 (L) 150 - 400 K/uL   nRBC 0.0 0.0 - 0.2 %    Comment: Performed at [facility], [place] [address] [place], [place] [address]    Dg Ankle Complete Right  Result Date: 06/16/2019 CLINICAL DATA:  Fall 2 days ago with persistent ankle pain and swelling, initial encounter EXAM: RIGHT ANKLE - COMPLETE 3+ VIEW COMPARISON:  None. FINDINGS: Oblique fracture through the distal tibial metaphysis is noted with mild displacement at the fracture site. Prior callus formation in the distal fibula is noted related to prior fracture. No other fracture is identified. IMPRESSION: Oblique fracture through the distal  tibial metaphysis. Electronically Signed   By: Inez Catalina M.D.   On: 06/16/2019 14:29   Ct Head  Wo Contrast  Result Date: 06/16/2019 CLINICAL DATA:  Status post fall.  Anticoagulation. EXAM: CT HEAD WITHOUT CONTRAST TECHNIQUE: Contiguous axial images were obtained from the base of the skull through the vertex without intravenous contrast. COMPARISON:  02/12/2019 FINDINGS: Brain: No evidence of acute infarction, hemorrhage, hydrocephalus, extra-axial collection or mass lesion/mass effect. There is mild diffuse low-attenuation within the subcortical and periventricular white matter compatible with chronic microvascular disease. Prominence of sulci and ventricles compatible with brain atrophy. Vascular: No hyperdense vessel or unexpected calcification. Skull: Normal. Negative for fracture or focal lesion. Sinuses/Orbits: No acute finding. Other: None. IMPRESSION: 1. No acute intracranial abnormality 2. Chronic small vessel ischemic change and brain atrophy. Electronically Signed   By: Kerby Moors M.D.   On: 06/16/2019 18:35   Ct Ankle Right Wo Contrast  Result Date: 06/16/2019 CLINICAL DATA:  Evaluate ankle fractures. EXAM: CT OF THE RIGHT ANKLE WITHOUT CONTRAST TECHNIQUE: Multidetector CT imaging of the right ankle was performed according to the standard protocol. Multiplanar CT image reconstructions were also generated. COMPARISON:  Radiographs 06/16/2019 FINDINGS: Remote appearing ununited oblique coursing distal tibia fracture. Extensive bony resorptive changes along the fracture margins but no osseous bridging. There is callus formation noted along the lateral aspect of the tibial cortex and some bony bridging across the upper lateral fracture site. Maximum of 8 mm of separation along the medial aspect of the fracture and no callus formation. There could be acute on chronic fractures. There is a largely healed distal fibular fracture with fairly extensive callus formation. The ankle mortise is  maintained. No fractures of the talus or calcaneus. No ankle joint effusion. The subtalar joints are maintained. Extensive small vessel disease with extensive calcifications. IMPRESSION: 1. Remote appearing ununited distal tibia fractures. Possible acute on chronic fractures. 2. Healed distal fibular fracture. 3. Intact ankle mortise. No mid or hindfoot fractures are identified. 4. Extensive small vessel disease. Electronically Signed   By: Marijo Sanes M.D.   On: 06/16/2019 18:56    Review of Systems  Constitutional: Negative for chills and fever.  Respiratory: Negative for shortness of breath.   Cardiovascular: Negative for chest pain and palpitations.  Neurological: Positive for sensory change (chronic B peripheral neuropathy LEx ).  Endo/Heme/Allergies: Bruises/bleeds easily.   Blood pressure (!) 118/56, pulse 71, temperature 98 F (36.7 C), temperature source Oral, resp. rate 16, height 5\' 11"  (1.803 m), weight 105.7 kg, SpO2 95 %. Physical Exam Vitals signs and nursing note reviewed.  Constitutional:      Comments: Pleasant white male, on HD   Cardiovascular:     Heart sounds: S1 normal and S2 normal.  Pulmonary:     Effort: Pulmonary effort is normal. No respiratory distress.  Musculoskeletal:     Comments: Right Lower Extremity  Posterior short leg splint is in place Partially split the splint to evaluate soft tissue Swelling is very well controlled Skin wrinkles very easily with gentle compression No apparent ecchymosis noted Mild swelling to the foot Again did not completely remove splint to evaluate old diabetic foot ulcers Severely diminished sensory functions until approximately mid lower leg EHL, FHL, lesser toe motor functions are grossly intact Compartments are soft and nontender, no pain with passive stretching Knee is unremarkable no acute findings noted to the knee, no pain with palpation of the knee or proximal fibula. Hip is without acute findings as well.  No  pain with axial loading or logrolling of his hip  Difficulty ascertaining DP pulse given dressing Faint DP noted  on the left foot   Neurological:     Mental Status: He is alert and oriented to person, place, and time.  Psychiatric:        Attention and Perception: Attention and perception normal.        Behavior: Behavior is cooperative.       Assessment/Plan:  58 year old male with complex medical history with right distal tibia and fibula fracture  -Right distal tibia and fibula fracture, extra-articular.  These fractures appear to be subacute and even chronic in nature.  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  There is significant resorption of the fracture site at the metaphysis of the tibia.  Patient has numerous comorbidities that increases risk for complications with internal fixation including peripheral neuropathy, chronicity of this fracture, alcohol use, history of diabetic foot ulcer, diabetes   Would need more towards external fixation of his tibia to provide some stability to see if we can get it united along with augmenting with a low intensity pulsed ultrasound bone growth stimulator   I do think that his risk for infection is significantly heightened given his previous leg diabetic foot ulcers.   Is a little bit of a conundrum as his swelling is very well controlled and could certainly tolerated plate osteosynthesis once his INR corrected.  If he were to get infected plate osteosynthesis would likely involve below knee amputation   Do think that external fixation is the safest intervention and likely the least risky.  I think that there may be a possibility that his fracture will unite with adding some biomechanical stabilization to the fracture site along with bone growth stimulator.  However should this fail we could always consider doing a hindfoot fusion  nail which is less invasive and perhaps more beneficial than formal nonunion takedown given his peripheral neuropathy   Will discuss further with attending no relief conditions of the soft tissues.  Would continue to keep him n.p.o. for now in preparation for surgery later on this afternoon   - Pain management:  Continue with current regimen   - ABL anemia/Hemodynamics  Stable  - Medical issues   supratherapeutic INR   INR rebounded up again this morning.   Continue management per medicine team   May proceed with application of external fixator this afternoon.  If we ultimately decided to proceed with plate osteosynthesis will need to have this corrected before proceeding with more invasive surgery  - DVT/PE prophylaxis:  Chronic coumadin PTA - ID:   periop abx  - Metabolic Bone Disease:  Suspect this is profound given his advanced renal disease  Check vitamin d levels   - FEN/GI prophylaxis/Foley/Lines:  NPO for now  - Impediments to fracture healing:  CKD on dialysis  etoh use  Diabetes   Chronic meds (lasix)  Poor balance  Snuff user   - Dispo:  Possible OR later today to address R distal tibia nonunion    Jari Pigg, PA-C [phone] (C) 06/18/2019, 9:13 AM  [facility] [place] [address] [phone] Domingo Sep (F)

## 2019-06-18 NOTE — Progress Notes (Signed)
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 Codeine Nausea And Vomiting    Patient Measurements: Height: 5\' 11"  (180.3 cm) Weight: 225 lb 5 oz (102.2 kg) IBW/kg (Calculated) : 75.3  Vital Signs: Temp: 97.9 F (36.6 C) (09/10 1032) Temp Source: Oral (09/10 1032) BP: 107/52 (09/10 1032) Pulse Rate: 67 (09/10 1032)  Labs: Recent Labs    06/16/19 1453 06/16/19 1613 06/17/19 0242 06/17/19 0925 06/18/19 0121  HGB 11.8*  --  10.1*  --  9.7*  HCT 37.9*  --  31.4*  --  30.6*  PLT 152  --  151  --  143*  LABPROT  --  68.4*  --  35.3* 35.8*  INR  --  8.4*  --  3.6* 3.7*  CREATININE 4.38*  --  4.82*  --   --     Estimated Creatinine Clearance: 20.6 mL/min (A) (by C-G formula based on SCr of 4.82 mg/dL (H)).  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   INR 8.4 on admit 9/8 with right ankle fracture.  Vitamin K 2.5 mg PO given on 9/8 and INR down to 3.6 yesterday but remains supratherapeutic (3.7) today.  Will need surgery per Orthopedics, procedure and timing pending.    PTA Coumadin:  7.5 mg TTSS, none on non-dialysis days.  Last dose 9/7.  Confirmed with patient.  He reports some recent bleeding when coming off HD.   Goal of Therapy:  INR 2-3 Monitor platelets by anticoagulation protocol: Yes   Plan:   No Coumadin again today.  Daily PT/INR.  Follow up Ortho plans.     Arty Baumgartner, [place] Pager: [phone] or phone: [phone] 06/18/2019,1:35 PM

## 2019-06-18 NOTE — Consult Note (Signed)
Reason for Consult: Continuity of ESRD care Referring Physician: Holli Humbles MD [facility])  HPI:  58 year old Caucasian man with past medical history significant for hypertension, congestive heart failure, dyslipidemia, obesity, obstructive sleep apnea not on CPAP, alcohol use, diabetes mellitus, coronary artery disease, history of pulmonary embolism and end-stage renal disease on hemodialysis on a Monday/Wednesday/Friday schedule at the [facility] unit in [place].  He was transferred to [facility] from [facility] after he presented there following loss of balance/a fall while holding his walker when he twisted his right ankle.  Consequent evaluation showed a right ankle fracture prompting the transfer here for orthopedic intervention.  The plan is noted for surgical intervention on his right ankle later today for which hemodialysis was done earlier today.  He complains of mild pain in his right leg that is somewhat better after splinting earlier.  Past Medical History:  Diagnosis Date  . 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 Alcohol abuse   . Anemia   . Asthma   . Cellulitis and abscess of right lower extremity 03/01/2017  . CHF (congestive heart failure) ([place])   . Chronic kidney disease    dialysis m,w,f  . Cognitive communication deficit   . COPD (chronic obstructive pulmonary disease) ([place])   . Coronary artery disease   . Diabetes mellitus without complication ([place])   . Edema extremities 03/01/2017   bilateral swelling  . GERD (gastroesophageal reflux disease)   . High cholesterol   . High triglycerides   . Hyperlipemia   . Hypertension   . Hypothyroidism   . Major depressive disorder   . Muscle weakness   . Neuropathy   . OSA (obstructive sleep apnea)   . Other abnormalities of gait and mobility   . Peripheral vascular disease ([place])   . Pneumonia   . Sleep apnea    can't use cpap d/t feelings of suffocation    Past Surgical History:   Procedure Laterality Date  . A/V FISTULAGRAM Right 08/27/2017   Procedure: A/V FISTULAGRAM;  Surgeon: Katha Cabal, MD;  Location: [place] [facility];  Service: Cardiovascular;  Laterality: Right;  . A/V FISTULAGRAM Right 11/04/2018   Procedure: A/V FISTULAGRAM;  Surgeon: Katha Cabal, MD;  Location: [place] [facility];  Service: Cardiovascular;  Laterality: Right;  . A/V SHUNT INTERVENTION N/A 06/23/2018   Procedure: A/V SHUNT INTERVENTION;  Surgeon: Algernon Huxley, MD;  Location: [place] [facility];  Service: Cardiovascular;  Laterality: N/A;  . APPENDECTOMY  2010  . AV FISTULA PLACEMENT Right 06/14/2017   Procedure: ARTERIOVENOUS (AV) FISTULA CREATION WRIST;  Surgeon: Katha Cabal, MD;  Location: [facility] ORS;  Service: Vascular;  Laterality: Right;  . DIALYSIS/PERMA CATHETER INSERTION N/A 03/05/2017   Procedure: Dialysis/Perma Catheter Insertion;  Surgeon: Katha Cabal, MD;  Location: [place] [facility];  Service: Cardiovascular;  Laterality: N/A;  . DIALYSIS/PERMA CATHETER INSERTION N/A 09/20/2017   Procedure: DIALYSIS/PERMA CATHETER INSERTION;  Surgeon: Katha Cabal, MD;  Location: [place] [facility];  Service: Cardiovascular;  Laterality: N/A;  . DIALYSIS/PERMA CATHETER INSERTION N/A 01/26/2019   Procedure: DIALYSIS/PERMA CATHETER INSERTION/Exchange;  Surgeon: Algernon Huxley, MD;  Location: [place] [facility];  Service: Cardiovascular;  Laterality: N/A;  . DIALYSIS/PERMA CATHETER INSERTION N/A 01/27/2019   Procedure: DIALYSIS/PERMA CATHETER INSERTION;  Surgeon: Katha Cabal, MD;  Location: [place] [facility];  Service: Cardiovascular;  Laterality: N/A;  . EXCHANGE OF A DIALYSIS CATHETER  03/29/2017  Procedure: Exchange Of A Dialysis Catheter;  Surgeon: Katha Cabal, MD;  Location: [place] [facility];  Service: Cardiovascular;;  . IRRIGATION AND DEBRIDEMENT FOOT Right 03/01/2017   Procedure: IRRIGATION AND DEBRIDEMENT FOOT;  Surgeon: Albertine Patricia, DPM;  Location: [facility] ORS;  Service: Podiatry;  Laterality: Right;  application of wound vac  . PERIPHERAL VASCULAR THROMBECTOMY Right 06/18/2018   Procedure: PERIPHERAL VASCULAR THROMBECTOMY;  Surgeon: Algernon Huxley, MD;  Location: [place] [facility];  Service: Cardiovascular;  Laterality: Right;  Marland Kitchen VASCULAR ACCESS DEVICE INSERTION Right 01/30/2019   Procedure: INSERTION OF HERO VASCULAR ACCESS DEVICE;  Surgeon: Katha Cabal, MD;  Location: [facility] ORS;  Service: Vascular;  Laterality: Right;    Family History  Problem Relation Age of Onset  . CAD Father   . Heart disease Father     Social History:  reports that he has quit smoking. His smokeless tobacco use includes snuff. He reports current alcohol use. He reports that he does not use drugs.  Allergies:  Allergies  Allergen Reactions  . Codeine Nausea And Vomiting    Medications:  Scheduled: . calcium acetate  2,001 mg Oral TID WC  . Chlorhexidine Gluconate Cloth  6 each Topical Q0600  . furosemide  80 mg Oral Daily  . gabapentin  300 mg Oral QHS  . insulin aspart  0-9 Units Subcutaneous TID WC  . metoprolol tartrate  12.5 mg Oral BID  . mirtazapine  15 mg Oral QHS  . pantoprazole  40 mg Oral Daily  . sodium bicarbonate  650 mg Oral BID  . spironolactone  25 mg Oral Daily  . Warfarin - Pharmacist Dosing Inpatient   Does not apply q1800   BMP Latest Ref Rng & Units 06/17/2019 06/16/2019 02/14/2019  Glucose 70 - 99 mg/dL 106(H) 81 99  BUN 6 - 20 mg/dL 23(H) 23(H) 12  Creatinine 0.61 - 1.24 mg/dL 4.82(H) 4.38(H) 3.32(H)  Sodium 135 - 145 mmol/L 132(L) 132(L) 138  Potassium 3.5 - 5.1 mmol/L 4.2 4.9 3.6  Chloride 98 - 111 mmol/L 99 97(L) 98  CO2 22 - 32 mmol/L 22 26 28   Calcium 8.9 - 10.3 mg/dL 8.3(L) 8.7(L) 8.4(L)   CBC Latest Ref Rng & Units 06/18/2019 06/17/2019 06/16/2019  WBC 4.0 - 10.5 K/uL 5.7 6.2 8.6  Hemoglobin 13.0 - 17.0 g/dL 9.7(L) 10.1(L) 11.8(L)  Hematocrit 39.0 - 52.0 % 30.6(L) 31.4(L) 37.9(L)  Platelets  150 - 400 K/uL 143(L) 151 152    Dg Ankle Complete Right  Result Date: 06/16/2019 CLINICAL DATA:  Fall 2 days ago with persistent ankle pain and swelling, initial encounter EXAM: RIGHT ANKLE - COMPLETE 3+ VIEW COMPARISON:  None. FINDINGS: Oblique fracture through the distal tibial metaphysis is noted with mild displacement at the fracture site. Prior callus formation in the distal fibula is noted related to prior fracture. No other fracture is identified. IMPRESSION: Oblique fracture through the distal tibial metaphysis. Electronically Signed   By: Inez Catalina M.D.   On: 06/16/2019 14:29   Ct Head Wo Contrast  Result Date: 06/16/2019 CLINICAL DATA:  Status post fall.  Anticoagulation. EXAM: CT HEAD WITHOUT CONTRAST TECHNIQUE: Contiguous axial images were obtained from the base of the skull through the vertex without intravenous contrast. COMPARISON:  02/12/2019 FINDINGS: Brain: No evidence of acute infarction, hemorrhage, hydrocephalus, extra-axial collection or mass lesion/mass effect. There is mild diffuse low-attenuation within the subcortical and periventricular white matter compatible with chronic microvascular disease. Prominence of sulci and ventricles compatible with  brain atrophy. Vascular: No hyperdense vessel or unexpected calcification. Skull: Normal. Negative for fracture or focal lesion. Sinuses/Orbits: No acute finding. Other: None. IMPRESSION: 1. No acute intracranial abnormality 2. Chronic small vessel ischemic change and brain atrophy. Electronically Signed   By: Kerby Moors M.D.   On: 06/16/2019 18:35   Ct Ankle Right Wo Contrast  Result Date: 06/16/2019 CLINICAL DATA:  Evaluate ankle fractures. EXAM: CT OF THE RIGHT ANKLE WITHOUT CONTRAST TECHNIQUE: Multidetector CT imaging of the right ankle was performed according to the standard protocol. Multiplanar CT image reconstructions were also generated. COMPARISON:  Radiographs 06/16/2019 FINDINGS: Remote appearing ununited oblique  coursing distal tibia fracture. Extensive bony resorptive changes along the fracture margins but no osseous bridging. There is callus formation noted along the lateral aspect of the tibial cortex and some bony bridging across the upper lateral fracture site. Maximum of 8 mm of separation along the medial aspect of the fracture and no callus formation. There could be acute on chronic fractures. There is a largely healed distal fibular fracture with fairly extensive callus formation. The ankle mortise is maintained. No fractures of the talus or calcaneus. No ankle joint effusion. The subtalar joints are maintained. Extensive small vessel disease with extensive calcifications. IMPRESSION: 1. Remote appearing ununited distal tibia fractures. Possible acute on chronic fractures. 2. Healed distal fibular fracture. 3. Intact ankle mortise. No mid or hindfoot fractures are identified. 4. Extensive small vessel disease. Electronically Signed   By: Marijo Sanes M.D.   On: 06/16/2019 18:56    Review of Systems  Constitutional: Negative.   HENT: Negative.   Eyes: Negative.   Respiratory: Negative.   Cardiovascular: Negative.   Gastrointestinal: Negative.   Genitourinary: Negative.   Musculoskeletal: Positive for joint pain.       Right leg discomfort  Skin: Negative.   Neurological: Positive for weakness. Negative for dizziness and headaches.       Chronic leg weakness   Blood pressure (!) 83/44, pulse 69, temperature 98 F (36.7 C), temperature source Oral, resp. rate 16, height 5\' 11"  (1.803 m), weight 105.7 kg, SpO2 95 %. Physical Exam  Nursing note and vitals reviewed. Constitutional: He is oriented to person, place, and time. He appears well-developed and well-nourished. No distress.  HENT:  Head: Normocephalic and atraumatic.  Mouth/Throat: Oropharynx is clear and moist.  Eyes: Pupils are equal, round, and reactive to light. Conjunctivae and EOM are normal. No scleral icterus.  Neck: Normal  range of motion. Neck supple. No JVD present.  Cardiovascular: Normal rate and regular rhythm.  No murmur heard. Respiratory: Effort normal and breath sounds normal. He has no wheezes. He has no rales.  GI: Soft. Bowel sounds are normal. There is no abdominal tenderness. There is no rebound.  Musculoskeletal:        General: No edema.     Comments: Right leg posterior splint with trace edema. RUA AVF cannulated  Neurological: He is alert and oriented to person, place, and time.  Skin: Skin is warm and dry.    Assessment/Plan: 1.  Right ankle fracture: Plans for surgical intervention later today.  Currently ongoing pain control and is status post posterior splint. 2.  End-stage renal disease on hemodialysis: Underwent hemodialysis today after missing his dialysis treatment yesterday, will reschedule again dialysis tomorrow to continue usual outpatient schedule of Monday/Wednesday/Friday.  Records have been requested from his outpatient dialysis unit. 3.  Hypertension: Blood pressure under fair control, monitor postoperatively for titration of antihypertensive therapy/adjustment of ultrafiltration.  4.  Anemia of chronic kidney disease: Likely compounded by recent osseous injury and associated blood loss, will adjust ESA dosing and use PRBC transfusion if hemoglobin <7. 5.  Secondary hyperparathyroidism: Obtain records from outpatient dialysis unit to determine vitamin D receptor analog/calcimimetic use as well as reconcile binder dosing. Sanyiah Kanzler K. 06/18/2019, 10:22 AM

## 2019-06-19 ENCOUNTER — Encounter (HOSPITAL_COMMUNITY)
Admission: EM | Disposition: A | Discharge status: Skilled Nursing Facility | Payer: Self-pay | Source: Home / Self Care | Attending: Internal Medicine

## 2019-06-19 ENCOUNTER — Inpatient Hospital Stay (HOSPITAL_COMMUNITY): Payer: Medicare Other | Admitting: Certified Registered Nurse Anesthetist

## 2019-06-19 ENCOUNTER — Inpatient Hospital Stay (HOSPITAL_COMMUNITY): Payer: Medicare Other

## 2019-06-19 ENCOUNTER — Encounter (HOSPITAL_COMMUNITY): Payer: Self-pay

## 2019-06-19 HISTORY — PX: ORIF ANKLE FRACTURE: SHX5408

## 2019-06-19 HISTORY — PX: REPAIR NON-UNION ULNA: SHX6724

## 2019-06-19 LAB — CBC
HCT: 33.6 % — ABNORMAL LOW (ref 39.0–52.0)
Hemoglobin: 10.6 g/dL — ABNORMAL LOW (ref 13.0–17.0)
MCH: 27.2 pg (ref 26.0–34.0)
MCHC: 31.5 g/dL (ref 30.0–36.0)
MCV: 86.4 fL (ref 80.0–100.0)
Platelets: 143 10*3/uL — ABNORMAL LOW (ref 150–400)
RBC: 3.89 MIL/uL — ABNORMAL LOW (ref 4.22–5.81)
RDW: 20.6 % — ABNORMAL HIGH (ref 11.5–15.5)
WBC: 5.5 10*3/uL (ref 4.0–10.5)
nRBC: 0 % (ref 0.0–0.2)

## 2019-06-19 LAB — GLUCOSE, CAPILLARY
Glucose-Capillary: 220 mg/dL — ABNORMAL HIGH (ref 70–99)
Glucose-Capillary: 174 mg/dL — ABNORMAL HIGH (ref 70–99)
Glucose-Capillary: 146 mg/dL — ABNORMAL HIGH (ref 70–99)
Glucose-Capillary: 145 mg/dL — ABNORMAL HIGH (ref 70–99)
Glucose-Capillary: 219 mg/dL — ABNORMAL HIGH (ref 70–99)

## 2019-06-19 LAB — POCT I-STAT 4, (NA,K, GLUC, HGB,HCT)
Glucose, Bld: 129 mg/dL — ABNORMAL HIGH (ref 70–99)
HCT: 29 % — ABNORMAL LOW (ref 39.0–52.0)
Hemoglobin: 9.9 g/dL — ABNORMAL LOW (ref 13.0–17.0)
Potassium: 3.8 mmol/L (ref 3.5–5.1)
Sodium: 136 mmol/L (ref 135–145)

## 2019-06-19 LAB — PROTIME-INR
INR: 3.3 — ABNORMAL HIGH (ref 0.8–1.2)
INR: 3.2 — ABNORMAL HIGH (ref 0.8–1.2)
Prothrombin Time: 32.7 seconds — ABNORMAL HIGH (ref 11.4–15.2)
Prothrombin Time: 32.4 seconds — ABNORMAL HIGH (ref 11.4–15.2)

## 2019-06-19 LAB — SURGICAL PCR SCREEN
MRSA, PCR: POSITIVE — AB
Staphylococcus aureus: POSITIVE — AB

## 2019-06-19 SURGERY — OPEN REDUCTION INTERNAL FIXATION (ORIF) ANKLE FRACTURE
Anesthesia: General | Site: Ankle | Laterality: Right

## 2019-06-19 MED ORDER — MIDAZOLAM HCL 2 MG/2ML IJ SOLN
INTRAMUSCULAR | Status: AC
  Filled 2019-06-19: qty 2

## 2019-06-19 MED ORDER — ALBUMIN HUMAN 5 % IV SOLN
INTRAVENOUS | Status: DC | PRN
  Administered 2019-06-19: 16:00:00 via INTRAVENOUS

## 2019-06-19 MED ORDER — SODIUM CHLORIDE 0.9% IV SOLUTION
Freq: Once | INTRAVENOUS | Status: AC
  Administered 2019-06-19: 09:00:00 via INTRAVENOUS

## 2019-06-19 MED ORDER — FENTANYL CITRATE (PF) 250 MCG/5ML IJ SOLN
INTRAMUSCULAR | Status: DC | PRN
  Administered 2019-06-19 (×2): 50 ug via INTRAVENOUS
  Administered 2019-06-19: 100 ug via INTRAVENOUS
  Administered 2019-06-19: 50 ug via INTRAVENOUS

## 2019-06-19 MED ORDER — CEFAZOLIN SODIUM-DEXTROSE 1-4 GM/50ML-% IV SOLN
1.0000 g | INTRAVENOUS | Status: DC
  Filled 2019-06-19: qty 50

## 2019-06-19 MED ORDER — FENTANYL CITRATE (PF) 250 MCG/5ML IJ SOLN
INTRAMUSCULAR | Status: AC
  Filled 2019-06-19: qty 5

## 2019-06-19 MED ORDER — PROPOFOL 10 MG/ML IV BOLUS
INTRAVENOUS | Status: DC | PRN
  Administered 2019-06-19: 100 mg via INTRAVENOUS

## 2019-06-19 MED ORDER — CEFAZOLIN SODIUM-DEXTROSE 1-4 GM/50ML-% IV SOLN
INTRAVENOUS | Status: AC
  Filled 2019-06-19: qty 50

## 2019-06-19 MED ORDER — SODIUM CHLORIDE 0.9 % IV SOLN
INTRAVENOUS | Status: DC | PRN
  Administered 2019-06-19: 75 ug/min via INTRAVENOUS

## 2019-06-19 MED ORDER — PHENYLEPHRINE 40 MCG/ML (10ML) SYRINGE FOR IV PUSH (FOR BLOOD PRESSURE SUPPORT)
PREFILLED_SYRINGE | INTRAVENOUS | Status: DC | PRN
  Administered 2019-06-19 (×4): 80 ug via INTRAVENOUS

## 2019-06-19 MED ORDER — PROPOFOL 10 MG/ML IV BOLUS
INTRAVENOUS | Status: AC
  Filled 2019-06-19: qty 20

## 2019-06-19 MED ORDER — SODIUM CHLORIDE 0.9 % IV SOLN
INTRAVENOUS | Status: DC
  Administered 2019-06-19 (×3): via INTRAVENOUS

## 2019-06-19 MED ORDER — MIDAZOLAM HCL 5 MG/5ML IJ SOLN
INTRAMUSCULAR | Status: DC | PRN
  Administered 2019-06-19: 2 mg via INTRAVENOUS

## 2019-06-19 MED ORDER — 0.9 % SODIUM CHLORIDE (POUR BTL) OPTIME
TOPICAL | Status: DC | PRN
  Administered 2019-06-19: 1000 mL

## 2019-06-19 MED ORDER — MUPIROCIN 2 % EX OINT
1.0000 "application " | TOPICAL_OINTMENT | Freq: Two times a day (BID) | CUTANEOUS | Status: AC
  Administered 2019-06-19 – 2019-06-23 (×10): 1 via NASAL
  Filled 2019-06-19: qty 22

## 2019-06-19 MED ORDER — EPHEDRINE SULFATE 50 MG/ML IJ SOLN
INTRAMUSCULAR | Status: DC | PRN
  Administered 2019-06-19: 5 mg via INTRAVENOUS
  Administered 2019-06-19 (×3): 10 mg via INTRAVENOUS
  Administered 2019-06-19: 5 mg via INTRAVENOUS

## 2019-06-19 MED ORDER — SUCCINYLCHOLINE CHLORIDE 20 MG/ML IJ SOLN
INTRAMUSCULAR | Status: DC | PRN
  Administered 2019-06-19: 100 mg via INTRAVENOUS

## 2019-06-19 MED ORDER — ONDANSETRON HCL 4 MG/2ML IJ SOLN
INTRAMUSCULAR | Status: AC
  Filled 2019-06-19: qty 2

## 2019-06-19 MED ORDER — ONDANSETRON HCL 4 MG/2ML IJ SOLN
INTRAMUSCULAR | Status: DC | PRN
  Administered 2019-06-19: 4 mg via INTRAVENOUS

## 2019-06-19 MED ORDER — LIDOCAINE 2% (20 MG/ML) 5 ML SYRINGE
INTRAMUSCULAR | Status: DC | PRN
  Administered 2019-06-19: 50 mg via INTRAVENOUS

## 2019-06-19 SURGICAL SUPPLY — 98 items
BANDAGE ESMARK 6X9 LF (GAUZE/BANDAGES/DRESSINGS) ×2 IMPLANT
BIT DRILL 2.5X2.75 QC CALB (BIT) ×3 IMPLANT
BIT DRILL CALIBRATED 2.7 (BIT) ×2 IMPLANT
BIT DRILL CALIBRATED 2.7MM (BIT) ×1
BNDG CMPR 9X6 STRL LF SNTH (GAUZE/BANDAGES/DRESSINGS) ×2
BNDG COHESIVE 6X5 TAN STRL LF (GAUZE/BANDAGES/DRESSINGS) IMPLANT
BNDG ELASTIC 4X5.8 VLCR STR LF (GAUZE/BANDAGES/DRESSINGS) ×4 IMPLANT
BNDG ELASTIC 6X5.8 VLCR STR LF (GAUZE/BANDAGES/DRESSINGS) ×4 IMPLANT
BNDG ESMARK 6X9 LF (GAUZE/BANDAGES/DRESSINGS) ×4
BNDG GAUZE ELAST 4 BULKY (GAUZE/BANDAGES/DRESSINGS) ×2 IMPLANT
BONE CANC CHIPS 40CC CAN1/2 (Bone Implant) ×4 IMPLANT
BRUSH SCRUB EZ PLAIN DRY (MISCELLANEOUS) ×8 IMPLANT
CANISTER SUCTION WELLS/JOHNSON (MISCELLANEOUS) ×3 IMPLANT
CANISTER WOUND CARE 500ML ATS (WOUND CARE) ×3 IMPLANT
CHIPS CANC BONE 40CC CAN1/2 (Bone Implant) ×2 IMPLANT
CLOSURE WOUND 1/2 X4 (GAUZE/BANDAGES/DRESSINGS)
COVER MAYO STAND STRL (DRAPES) ×1 IMPLANT
COVER SURGICAL LIGHT HANDLE (MISCELLANEOUS) ×5 IMPLANT
COVER WAND RF STERILE (DRAPES) ×4 IMPLANT
CUFF TOURN SGL QUICK 18X4 (TOURNIQUET CUFF) IMPLANT
CUFF TOURN SGL QUICK 34 (TOURNIQUET CUFF) ×4
CUFF TRNQT CYL 34X4.125X (TOURNIQUET CUFF) ×1 IMPLANT
DRAPE C-ARM 42X72 X-RAY (DRAPES) ×4 IMPLANT
DRAPE C-ARMOR (DRAPES) ×4 IMPLANT
DRAPE HALF SHEET 40X57 (DRAPES) ×4 IMPLANT
DRAPE U-SHAPE 47X51 STRL (DRAPES) ×4 IMPLANT
DRSG ADAPTIC 3X8 NADH LF (GAUZE/BANDAGES/DRESSINGS) ×1 IMPLANT
DRSG EMULSION OIL 3X3 NADH (GAUZE/BANDAGES/DRESSINGS) IMPLANT
DRSG MEPITEL 4X7.2 (GAUZE/BANDAGES/DRESSINGS) IMPLANT
ELECT REM PT RETURN 9FT ADLT (ELECTROSURGICAL) ×4
ELECTRODE REM PT RTRN 9FT ADLT (ELECTROSURGICAL) ×2 IMPLANT
EVACUATOR 1/8 PVC DRAIN (DRAIN) IMPLANT
GAUZE SPONGE 4X4 12PLY STRL (GAUZE/BANDAGES/DRESSINGS) ×2 IMPLANT
GLOVE BIO SURGEON STRL SZ7.5 (GLOVE) ×4 IMPLANT
GLOVE BIO SURGEON STRL SZ8 (GLOVE) ×4 IMPLANT
GLOVE BIOGEL PI IND STRL 7.5 (GLOVE) ×2 IMPLANT
GLOVE BIOGEL PI IND STRL 8 (GLOVE) ×2 IMPLANT
GLOVE BIOGEL PI INDICATOR 7.5 (GLOVE) ×2
GLOVE BIOGEL PI INDICATOR 8 (GLOVE) ×2
GOWN STRL REUS W/ TWL LRG LVL3 (GOWN DISPOSABLE) ×4 IMPLANT
GOWN STRL REUS W/ TWL XL LVL3 (GOWN DISPOSABLE) ×2 IMPLANT
GOWN STRL REUS W/TWL LRG LVL3 (GOWN DISPOSABLE) ×8
GOWN STRL REUS W/TWL XL LVL3 (GOWN DISPOSABLE) ×4
GRAFT BNE CHIP CANC 1-8 40 (Bone Implant) IMPLANT
K-WIRE ACE 1.6X6 (WIRE) ×20
KIT BASIN OR (CUSTOM PROCEDURE TRAY) ×4 IMPLANT
KIT INFUSE LRG II (Orthopedic Implant) ×3 IMPLANT
KIT INFUSE SMALL (Orthopedic Implant) ×3 IMPLANT
KIT TURNOVER KIT B (KITS) ×4 IMPLANT
KWIRE ACE 1.6X6 (WIRE) ×5 IMPLANT
MANIFOLD NEPTUNE II (INSTRUMENTS) ×1 IMPLANT
NDL HYPO 21X1.5 SAFETY (NEEDLE) IMPLANT
NEEDLE 22X1 1/2 (OR ONLY) (NEEDLE) IMPLANT
NEEDLE HYPO 21X1.5 SAFETY (NEEDLE) IMPLANT
NS IRRIG 1000ML POUR BTL (IV SOLUTION) ×4 IMPLANT
PACK GENERAL/GYN (CUSTOM PROCEDURE TRAY) ×1 IMPLANT
PACK ORTHO EXTREMITY (CUSTOM PROCEDURE TRAY) ×4 IMPLANT
PAD ARMBOARD 7.5X6 YLW CONV (MISCELLANEOUS) ×8 IMPLANT
PAD CAST 4YDX4 CTTN HI CHSV (CAST SUPPLIES) ×1 IMPLANT
PADDING CAST COTTON 4X4 STRL (CAST SUPPLIES) ×4
PADDING CAST COTTON 6X4 STRL (CAST SUPPLIES) ×5 IMPLANT
PLATE 9H 184 RT MED DIST TIB (Plate) ×3 IMPLANT
PLATE LOCK 6H 77 BILAT FIB (Plate) ×3 IMPLANT
PREVENA RESTOR AXIOFORM 29X28 (GAUZE/BANDAGES/DRESSINGS) ×3 IMPLANT
SCREW CORT 3.5X40 (Screw) ×9 IMPLANT
SCREW CORTICAL 3.5MM  28MM (Screw) ×2 IMPLANT
SCREW CORTICAL 3.5MM  30MM (Screw) ×2 IMPLANT
SCREW CORTICAL 3.5MM 26MM (Screw) ×3 IMPLANT
SCREW CORTICAL 3.5MM 28MM (Screw) ×1 IMPLANT
SCREW CORTICAL 3.5MM 30MM (Screw) ×1 IMPLANT
SCREW CORTICAL 3.5X46MM (Screw) ×3 IMPLANT
SCREW LOCK 3.5X16 DIST TIB (Screw) ×3 IMPLANT
SCREW LOCK 3.5X50 DIST TIB (Screw) ×6 IMPLANT
SCREW LOCK CORT STAR 3.5X44 (Screw) ×3 IMPLANT
SCREW LOCK CORT STAR 3.5X50 (Screw) ×3 IMPLANT
SCREW LOCK CORT STAR 3.5X52 (Screw) ×3 IMPLANT
SCREW LOW PROFILE 3.5MMX42 (Screw) ×3 IMPLANT
SCREW LP 3.5X65MM (Screw) ×3 IMPLANT
SCREW NON LOCKING LP 3.5 16MM (Screw) ×3 IMPLANT
SPONGE LAP 18X18 RF (DISPOSABLE) ×4 IMPLANT
STAPLER VISISTAT 35W (STAPLE) ×3 IMPLANT
STOCKINETTE IMPERVIOUS LG (DRAPES) IMPLANT
STRIP CLOSURE SKIN 1/2X4 (GAUZE/BANDAGES/DRESSINGS) IMPLANT
SUCTION FRAZIER HANDLE 10FR (MISCELLANEOUS) ×2
SUCTION TUBE FRAZIER 10FR DISP (MISCELLANEOUS) ×2 IMPLANT
SUT ETHILON 3 0 PS 1 (SUTURE) ×5 IMPLANT
SUT PDS AB 2-0 CT1 27 (SUTURE) IMPLANT
SUT VIC AB 1 CT1 27 (SUTURE) ×4
SUT VIC AB 1 CT1 27XBRD ANBCTR (SUTURE) ×1 IMPLANT
SUT VIC AB 2-0 CT1 27 (SUTURE) ×4
SUT VIC AB 2-0 CT1 TAPERPNT 27 (SUTURE) ×3 IMPLANT
TOWEL GREEN STERILE (TOWEL DISPOSABLE) ×5 IMPLANT
TOWEL GREEN STERILE FF (TOWEL DISPOSABLE) ×8 IMPLANT
TUBE CONNECTING 12'X1/4 (SUCTIONS) ×1
TUBE CONNECTING 12X1/4 (SUCTIONS) ×3 IMPLANT
UNDERPAD 30X30 (UNDERPADS AND DIAPERS) ×7 IMPLANT
WATER STERILE IRR 1000ML POUR (IV SOLUTION) ×1 IMPLANT
YANKAUER SUCT BULB TIP NO VENT (SUCTIONS) ×3 IMPLANT

## 2019-06-19 NOTE — Anesthesia Preprocedure Evaluation (Addendum)
Anesthesia Evaluation  Patient identified by MRN, date of birth, ID band Patient awake    Reviewed: Allergy & Precautions, NPO status , Patient's Chart, lab work & pertinent test results  Airway Mallampati: II  TM Distance: >3 FB     Dental   Pulmonary asthma , sleep apnea , pneumonia, COPD, former smoker,    breath sounds clear to auscultation       Cardiovascular hypertension, + CAD, + Peripheral Vascular Disease and +CHF   Rhythm:Regular Rate:Normal  IMPRESSIONS    1. The left ventricle has normal systolic function with an ejection fraction of 60-65%. The cavity size was normal. There is mildly increased left ventricular wall thickness. Left ventricular diastolic parameters were normal. No evidence of left  ventricular regional wall motion abnormalities.  2. The right ventricle has normal systolic function. The cavity was normal. There is no increase in right ventricular wall thickness.  3. Trivial pericardial effusion is present.  4. The pericardial effusion is posterior to the left ventricle.  5. The aortic valve is tricuspid. Moderate calcification of the aortic valve. Moderate aortic annular calcification noted.  6. The mitral valve is grossly normal. There is mild mitral annular calcification present.  7. The tricuspid valve is grossly normal.  8. The aortic root is normal in size and structure.  9. There is a large, relatively linear but lobate appearing echodensity in the right atrium from base to tricuspid valve. There are three separate appearing globular echodensities along it's course with largest approximately 1.5 x 2.5 cm. This was not  well-defined on the recent echocardiogram in late April at [facility], but there is shadowing in the same area based on review of the images. Patient reportedly has a hemodialysis catheter in place. There is an apparent catheter noted in the IVC, but cannot  trace to right atrium necessarily.  This looks less like a mass than potentially thrombus associated with a catheter. One would not necessarily expect this to explain stroke unless there was a paradoxical embolus. If this is thrombus it would however  increase risk of pulmonary emboli. I spoke with Dr. Manuella Ghazi about the findings. Consider anticoagulation. Chest CT and TEE may also be helpful.   Neuro/Psych Depression    GI/Hepatic GERD  ,  Endo/Other  diabetesHypothyroidism   Renal/GU Renal disease     Musculoskeletal   Abdominal   Peds  Hematology  (+) anemia ,   Anesthesia Other Findings   Reproductive/Obstetrics                            Anesthesia Physical Anesthesia Plan  ASA: IV  Anesthesia Plan: General   Post-op Pain Management:    Induction: Intravenous  PONV Risk Score and Plan: 2 and Ondansetron, Dexamethasone and Midazolam  Airway Management Planned: Oral ETT  Additional Equipment:   Intra-op Plan:   Post-operative Plan: Possible Post-op intubation/ventilation  Informed Consent: I have reviewed the patients History and Physical, chart, labs and discussed the procedure including the risks, benefits and alternatives for the proposed anesthesia with the patient or authorized representative who has indicated his/her understanding and acceptance.     Dental advisory given  Plan Discussed with: CRNA, Anesthesiologist and Surgeon  Anesthesia Plan Comments:        Anesthesia Quick Evaluation

## 2019-06-19 NOTE — Progress Notes (Signed)
Patient to OR for ORIF. Report given to anesthesiologist.

## 2019-06-19 NOTE — Progress Notes (Signed)
Progress note    Steven [name] [id number] DOB: Nov 13, 1960 DOA: 06/16/2019  PCP: Maryruth Hancock, MD   Patient coming from: Home   Chief Complaint: Right ankle pain.   HPI: Steven [name] is a 58 y.o. male with medical history significant of history of pulmonary embolism, alcohol abuse, ambulatory dysfunction, hypertension, COPD, type 2 diabetes mellitus and end-stage renal disease on hemodialysis. Patient uses a walker for ambulation.  Yesterday while trying to get bathroom he lost his balance while holding his walker and twisted his right ankle.  No head trauma no loss of consciousness.  Post trauma he had significant pain on his right ankle, sharp in nature, 10 out of 10 in intensity, worse with weightbearing, associated with local edema, no fevers, no chills.  No improving factors.  Yesterday he missed his hemodialysis session due to right ankle pain. Today he presented to the hospital due to persistent symptoms. In ED patient was diagnosed with right ankle fracture, orthopedics was consulted, recommendations for splinting, remain nonweightbearing to the right lower extremity and transferred to [facility], for further surgical evaluation.  Subjective: No acute issues or events overnight, pain currently well controlled; denies chest pain, shortness of breath, nausea, vomiting, diarrhea, constipation, headache, fevers, or chills.  Assessment/Plan Principal Problem:   Ankle fracture Active Problems:   Diabetes mellitus type 2 in obese (HCC)   Hyperlipidemia   Morbid obesity (HCC)   Essential hypertension   End stage renal disease (HCC)   COPD, mild (HCC)   Acute right ankle fracture after mechanical fall, POA   -Pain well controlled with IV morphine, splint has been placed in the emergency department.   -Continue to follow-up with orthopedic recommendations -pending external versus internal fixation -INR stable around 3, currently receiving FFP per surgery preoperatively   Supratherapeutic INR, history of pulmonary embolism on warfarin, resolving.  INR elevated 8.4 at intake Improved to 3.2 currently -defer to orthopedic surgery for INR goal Continue to hold warfarin  End-stage renal disease on hemodialysis MWF.  -Defer to nephrology  -Appears euvolemic, follow labs with dialysis  -Continue furosemide and spironolactone.   -Continue serum bicarbonate and PhosLo.  Hypertension, essential.   Continue home medications including metoprolol, furosemide and spironolactone.  Insulin-dependent type 2 diabetes mellitus.   -Continue sliding scale insulin, hypoglycemic protocol  -Continue to hold basal insulin for now while n.p.o. perioperatively  COPD, not in acute exacerbation.   -Continue home nebs, currently oxygenating well on room air  Depression.  -Continue mirtazapine.  DVT prophylaxis: warfarin -currently on hold due to supratherapeutic INR as above and need for surgery -resume once cleared by surgery Code Status: full  Family Communication: no family at the bedside   Disposition Plan: Pending surgery and clinical improvement likely disposition to rehab versus home with home health Consults called: Orthopedics  Admission status: Inpatient.  Continues to require IV pain medication in the setting of acute ankle fracture, surgery tentatively planned in the next 24 to 48 hours.  Physical Exam: Vitals:   06/18/19 1336 06/18/19 2020 06/18/19 2048 06/19/19 0514  BP: 133/72 99/62 111/62 137/67  Pulse: 83 83 89 77  Resp: 16 18  18   Temp: 98.7 F (37.1 C) 97.8 F (36.6 C)  98.4 F (36.9 C)  TempSrc: Oral Oral  Oral  SpO2: 98% 98%  95%  Weight:      Height:        Vitals:   06/18/19 1336 06/18/19 2020 06/18/19 2048 06/19/19 0514  BP: 133/72  99/62 111/62 137/67  Pulse: 83 83 89 77  Resp: 16 18  18   Temp: 98.7 F (37.1 C) 97.8 F (36.6 C)  98.4 F (36.9 C)  TempSrc: Oral Oral  Oral  SpO2: 98% 98%  95%  Weight:      Height:       General:   Pleasantly resting in bed, No acute distress. HEENT:  Normocephalic atraumatic.  Sclerae nonicteric, noninjected.  Extraocular movements intact bilaterally. Neck:  Without mass or deformity.  Trachea is midline. Lungs:  Clear to auscultate bilaterally without rhonchi, wheeze, or rales. Heart:  Regular rate and rhythm.  Without murmurs, rubs, or gallops. Abdomen:  Soft, nontender, nondistended.  Without guarding or rebound. Extremities: Right foot bandage clean dry intact. Vascular:  Dorsalis pedis and posterior tibial pulses palpable bilaterally. Skin:  Warm and dry, no erythema, no ulcerations.   Labs on Admission: I have personally reviewed following labs and imaging studies  CBC: Recent Labs  Lab 06/16/19 1453 06/17/19 0242 06/18/19 0121 06/19/19 0554  WBC 8.6 6.2 5.7 5.5  NEUTROABS 7.3  --   --   --   HGB 11.8* 10.1* 9.7* 10.6*  HCT 37.9* 31.4* 30.6* 33.6*  MCV 86.5 85.1 86.4 86.4  PLT 152 151 143* [id number]*   Basic Metabolic Panel: Recent Labs  Lab 06/16/19 1453 06/17/19 0242  NA 132* 132*  K 4.9 4.2  CL 97* 99  CO2 26 22  GLUCOSE 81 106*  BUN 23* 23*  CREATININE 4.38* 4.82*  CALCIUM 8.7* 8.3*   GFR: Estimated Creatinine Clearance: 20.6 mL/min (A) (by C-G formula based on SCr of 4.82 mg/dL (H)). Liver Function Tests: Recent Labs  Lab 06/16/19 1453  AST 20  ALT 17  ALKPHOS 126  BILITOT 0.9  PROT 6.2*  ALBUMIN 2.9*   Coagulation Profile: Recent Labs  Lab 06/16/19 1613 06/17/19 0925 06/18/19 0121 06/18/19 1930 06/19/19 0554  INR 8.4* 3.6* 3.7* 3.2* 3.3*   CBG: Recent Labs  Lab 06/17/19 2132 06/18/19 1223 06/18/19 1724 06/18/19 2200 06/19/19 0556  GLUCAP 214* 114* 295* 186* 220*   Urine analysis:    Component Value Date/Time   COLORURINE RED (A) 02/11/2019 1336   APPEARANCEUR HAZY (A) 02/11/2019 1336   LABSPEC 1.010 02/11/2019 1336   PHURINE 7.0 02/11/2019 1336   GLUCOSEU 250 (A) 02/11/2019 1336   HGBUR LARGE (A) 02/11/2019 1336    BILIRUBINUR NEGATIVE 02/11/2019 1336   KETONESUR TRACE (A) 02/11/2019 1336   PROTEINUR >300 (A) 02/11/2019 1336   NITRITE POSITIVE (A) 02/11/2019 1336   LEUKOCYTESUR SMALL (A) 02/11/2019 1336    Radiological Exams on Admission: No results found.   Time spent: 35 minutes  Little Ishikawa DO [facility]  06/19/2019, 7:53 AM

## 2019-06-19 NOTE — Consult Note (Signed)
   [facility] CM Inpatient Consult   06/19/2019  ALPHONSE SKOKAN 1961-04-23 [id number]    Patientwas screenedforpotentialneeds of[facility] Care Management services under his Medicare/ [employer] A[employer] with an extreme highrisk score of 43% for unplanned readmission; has 3 hospitalizations and 2 ED visits in the past 6 months.  Review of patient's medical record reveals that: TYREK AHUNA is a 58 y.o. male with medical history significant of history of pulmonary embolism, alcohol abuse, ambulatory dysfunction, hypertension, COPD, type 2 diabetes mellitus and end-stage renal disease on hemodialysis. Patient uses a walker for ambulation.   Prior to admission, he missed his hemodialysis session due to right ankle pain. He presented to the hospital due to persistent symptoms. In ED, patient was diagnosed with right ankle fracture, orthopedics was consulted, recommendations for splinting, remain nonweightbearing to the right lower extremity and transferred to [facility], for further surgical evaluation.  Primary care provider isDr. Benny Lennert with Primary Care at [facility], listed as providing transition of care.  Per MD notes today, patient's current disposition:pending surgery and clinical improvement likely disposition to rehab versus home with home health.  Will follow for progress and disposition/ needs. , Please place a [place] Management referralfor any needs for community follow-up as appropriate.  Of note, [facility] Care Management services does not replace or interfere with any services that are arranged bytransition of carecase management or social work.   For questions and referral, please contact:  Nnaemeka Samson A. Quade Ramirez, BSN, RN-BC [facility] Liaison Cell: [phone]

## 2019-06-19 NOTE — Progress Notes (Signed)
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 Codeine Nausea And Vomiting    Patient Measurements: Height: 5\' 11"  (180.3 cm) Weight: 225 lb 5 oz (102.2 kg) IBW/kg (Calculated) : 75.3  Vital Signs: Temp: 98.1 F (36.7 C) (09/11 1402) Temp Source: Oral (09/11 1402) BP: 117/69 (09/11 1402) Pulse Rate: 64 (09/11 1402)  Labs: Recent Labs    06/17/19 0242  06/18/19 0121 06/18/19 1930 06/19/19 0554 06/19/19 1316 06/19/19 1503  HGB 10.1*  --  9.7*  --  10.6*  --  9.9*  HCT 31.4*  --  30.6*  --  33.6*  --  29.0*  PLT 151  --  143*  --  143*  --   --   LABPROT  --    < > 35.8* 32.2* 32.7* 32.4*  --   INR  --    < > 3.7* 3.2* 3.3* 3.2*  --   CREATININE 4.82*  --   --   --   --   --   --    < > = values in this interval not displayed.    Estimated Creatinine Clearance: 20.6 mL/min (A) (by C-G formula based on SCr of 4.82 mg/dL (H)).  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   INR 8.4 on admit 9/8 with right ankle fracture.  Vitamin K 2.5 mg PO given on 9/8 and INR down but remains supratherapeutic (3.2).  FFP given this am. Currently in OR.    PTA Coumadin:  7.5 mg TTSS, none on non-dialysis days.  Last dose 9/7.  Confirmed with patient.  He reports some recent bleeding when coming off HD.   Goal of Therapy:  INR 2-3 Monitor platelets by anticoagulation protocol: Yes   Plan:   No Coumadin again today.  Daily PT/INR.    Arty Baumgartner, [place] Pager: [phone] or phone: [phone] 06/19/2019,5:42 PM

## 2019-06-19 NOTE — Brief Op Note (Signed)
06/16/2019 - 06/19/2019  6:07 PM  PATIENT:  Cato Mulligan  58 y.o. male  PRE-OPERATIVE DIAGNOSIS:   1. RIGHT DISTAL TIBIAL PILON NONUNION 2. POSSIBLE SYNDESMOTIC DISRUPTION  POST-OPERATIVE DIAGNOSIS:   1. RIGHT DISTAL TIBIAL PILON NONUNION, TIBIA AND FIBULA 2. MAISONEUVE FRACTURE WITH CHRONIC SYNDESMOTIC DISRUPTION  PROCEDURE:  Procedure(s): 1. OPEN REDUCTION INTERNAL FIXATION (ORIF) RIGHT TIBIA PILON FRACTURE NONUNION, TIBIA ONLY (Right) 2. RIGHT TIBIA AND FIBULA ARTHRODESIS 3. APPLICATION OF PROVENA WOUND VAC  SURGEON:  Surgeon(s) and Role:    * Altamese Valencia, MD - Primary  PHYSICIAN ASSISTANT: Ainsley Spinner, PA-C  ANESTHESIA:   general  EBL:  125 mL   BLOOD ADMINISTERED:none  DRAINS: none   LOCAL MEDICATIONS USED:  NONE  SPECIMEN:  No Specimen  DISPOSITION OF SPECIMEN:  N/A  COUNTS:  YES  TOURNIQUET:  NONE  DICTATION: .Other Dictation: Dictation Number [id number]  PLAN OF CARE: Admit to inpatient   PATIENT DISPOSITION:  PACU - hemodynamically stable.   Delay start of Pharmacological VTE agent (>24hrs) due to surgical blood loss or risk of bleeding: yes

## 2019-06-19 NOTE — Progress Notes (Signed)
Pharmacy Antibiotic Note  Steven [name] is a 58 y.o. male admitted on 06/16/2019 with surgical prophylaxis.  Pharmacy has been consulted for Ancef dosing.  ID: Tmax 99, WBC 5.5, no abx. ESRD - 9/11: ORIF R tibia pilon fx, tib/fib arthrodesis, wound vac   9/11 Ancef>>   911 MRSA PCR: positive  Plan: Ancef 1g IV q 24h x 1 dose post-op Unsure of MD desired length of therapy with wound vac.    Height: 5\' 11"  (180.3 cm) Weight: 225 lb 5 oz (102.2 kg) IBW/kg (Calculated) : 75.3  Temp (24hrs), Avg:98.2 F (36.8 C), Min:97.7 F (36.5 C), Max:99 F (37.2 C)  Recent Labs  Lab 06/16/19 1453 06/17/19 0242 06/18/19 0121 06/19/19 0554  WBC 8.6 6.2 5.7 5.5  CREATININE 4.38* 4.82*  --   --     Estimated Creatinine Clearance: 20.6 mL/min (A) (by C-G formula based on SCr of 4.82 mg/dL (H)).    Allergies  Allergen Reactions  . Codeine Nausea And Vomiting   Steven [name] S. Alford Highland, PharmD, [place] Clinical Staff Pharmacist  [name] [facility] 06/19/2019 6:55 PM

## 2019-06-19 NOTE — Transfer of Care (Signed)
Immediate Anesthesia Transfer of Care Note  Patient: Steven [name]  Procedure(s) Performed: OPEN REDUCTION INTERNAL FIXATION (ORIF) ANKLE FRACTURE (Right Ankle) Repair Non-[place] right tibia (Right Ankle)  Patient Location: PACU  Anesthesia Type:General  Level of Consciousness: drowsy and patient cooperative  Airway & Oxygen Therapy: Patient Spontanous Breathing  Post-op Assessment: Report given to RN and Post -op Vital signs reviewed and stable  Post vital signs: Reviewed and stable  Last Vitals:  Vitals Value Taken Time  BP 119/63 06/19/19 1819  Temp    Pulse 85 06/19/19 1820  Resp 19 06/19/19 1820  SpO2 95 % 06/19/19 1820  Vitals shown include unvalidated device data.  Last Pain:  Vitals:   06/19/19 1447  TempSrc:   PainSc: 0-No pain      Patients Stated Pain Goal: 3 ([id number])  Complications: No apparent anesthesia complications

## 2019-06-19 NOTE — Progress Notes (Signed)
I have continued to discuss with the patient the status of his elevated INR and the options for treatment of his fracture. We have given him FFP this am and have more available if needed intra-operatively. Blood loss and bleeding potential from this surgery is quite low but not negligible.   I discussed with the patient the risks and benefits of surgery for his right ankle subacute fracture, including the possibility of infection, nerve injury, vessel injury, wound breakdown, arthritis, symptomatic hardware, DVT/ PE, loss of motion, malunion, nonunion, and need for further surgery among others.  We also specifically discussed the elevated risks of bleeding or of soft tissue breakdown that could lead to amputation. He acknowledged these risks and wished to proceed.  Altamese DeWitt, MD [facility], [facility] [phone]

## 2019-06-19 NOTE — Plan of Care (Signed)
  Problem: Health Behavior/Discharge Planning: Goal: Ability to manage health-related needs will improve Outcome: Progressing   

## 2019-06-19 NOTE — Progress Notes (Signed)
Patient's PCR surgical screening positive for MRSA and Staph, standing orders initiated.

## 2019-06-19 NOTE — Anesthesia Procedure Notes (Signed)
Procedure Name: Intubation Date/Time: 06/19/2019 3:11 PM Performed by: Mariea Clonts, CRNA Pre-anesthesia Checklist: Patient identified, Emergency Drugs available, Suction available and Patient being monitored Patient Re-evaluated:Patient Re-evaluated prior to induction Oxygen Delivery Method: Circle System Utilized and Circle system utilized Preoxygenation: Pre-oxygenation with 100% oxygen Induction Type: IV induction Ventilation: Mask ventilation without difficulty and Oral airway inserted - appropriate to patient size Laryngoscope Size: Mac and 4 Grade View: Grade I Tube type: Oral Tube size: 8.0 mm Number of attempts: 1 Airway Equipment and Method: Stylet and Oral airway Placement Confirmation: ETT inserted through vocal cords under direct vision,  positive ETCO2 and breath sounds checked- equal and bilateral Tube secured with: Tape Dental Injury: Teeth and Oropharynx as per pre-operative assessment

## 2019-06-19 NOTE — Progress Notes (Signed)
Patient ID: Steven [name], male   DOB: September 11, 1961, 58 y.o.   MRN: [id number]  [facility] Progress Note   Assessment/ Plan:   1.  Right ankle fracture: Plans for surgical intervention later today.  Currently ongoing pain control and is status post posterior splint. 2.  End-stage renal disease on hemodialysis: Underwent hemodialysis off schedule yesterday, will reschedule again dialysis tomorrow to allow for uninterrupted surgical schedule and resume usual Monday/Wednesday/Friday schedule next week.   3.  Hypertension: Blood pressure under fair control, monitor postoperatively for titration of antihypertensive therapy/adjustment of ultrafiltration. 4.  Anemia of chronic kidney disease: Likely compounded by recent osseous injury and associated blood loss, will adjust ESA dosing and use PRBC transfusion if hemoglobin <7. 5.  Secondary hyperparathyroidism: Awaiting records from outpatient dialysis unit to determine vitamin D receptor analog/calcimimetic use as well as reconcile binder dosing.  Subjective:   Reports to be ready for surgery this morning- tolerable right ankle/leg pain.   Objective:   BP 140/66   Pulse 78   Temp 99 F (37.2 C) (Oral)   Resp 16   Ht 5\' 11"  (1.803 m)   Wt 102.2 kg   SpO2 97%   BMI 31.42 kg/m   Physical Exam: [id number] resting in bed [id number] regular rhythm and normal rate, normal s1 and s2 Resp:clear to auscultation bilaterally, no rales/rhonchi [id number], obese, NT [id number] leg in posterior splint with 1+ edema. Right BCF  Labs: BMET Recent Labs  Lab 06/16/19 1453 06/17/19 0242  NA 132* 132*  K 4.9 4.2  CL 97* 99  CO2 26 22  GLUCOSE 81 106*  BUN 23* 23*  CREATININE 4.38* 4.82*  CALCIUM 8.7* 8.3*   CBC Recent Labs  Lab 06/16/19 1453 06/17/19 0242 06/18/19 0121 06/19/19 0554  WBC 8.6 6.2 5.7 5.5  NEUTROABS 7.3  --   --   --   HGB 11.8* 10.1* 9.7* 10.6*  HCT 37.9* 31.4* 30.6* 33.6*  MCV 86.5 85.1 86.4 86.4  PLT  152 151 143* 143*   Medications:    . sodium chloride   Intravenous Once  . calcium acetate  2,001 mg Oral TID WC  . Chlorhexidine Gluconate Cloth  6 each Topical Q0600  . furosemide  80 mg Oral Daily  . gabapentin  300 mg Oral QHS  . insulin aspart  0-9 Units Subcutaneous TID WC  . metoprolol tartrate  12.5 mg Oral BID  . mirtazapine  15 mg Oral QHS  . mupirocin ointment  1 application Nasal BID  . pantoprazole  40 mg Oral Daily  . sodium bicarbonate  650 mg Oral BID  . spironolactone  25 mg Oral Daily  . Warfarin - Pharmacist Dosing Inpatient   Does not apply [id number]   Elmarie Shiley, MD 06/19/2019, 11:24 AM

## 2019-06-20 LAB — GLUCOSE, CAPILLARY
Glucose-Capillary: 240 mg/dL — ABNORMAL HIGH (ref 70–99)
Glucose-Capillary: 188 mg/dL — ABNORMAL HIGH (ref 70–99)
Glucose-Capillary: 123 mg/dL — ABNORMAL HIGH (ref 70–99)
Glucose-Capillary: 222 mg/dL — ABNORMAL HIGH (ref 70–99)

## 2019-06-20 LAB — CBC
HCT: 29.1 % — ABNORMAL LOW (ref 39.0–52.0)
Hemoglobin: 9 g/dL — ABNORMAL LOW (ref 13.0–17.0)
MCH: 27 pg (ref 26.0–34.0)
MCHC: 30.9 g/dL (ref 30.0–36.0)
MCV: 87.4 fL (ref 80.0–100.0)
Platelets: 133 10*3/uL — ABNORMAL LOW (ref 150–400)
RBC: 3.33 MIL/uL — ABNORMAL LOW (ref 4.22–5.81)
RDW: 20.4 % — ABNORMAL HIGH (ref 11.5–15.5)
WBC: 5.6 10*3/uL (ref 4.0–10.5)
nRBC: 0 % (ref 0.0–0.2)

## 2019-06-20 LAB — HEMOGLOBIN A1C
Hgb A1c MFr Bld: 6.1 % — ABNORMAL HIGH (ref 4.8–5.6)
Mean Plasma Glucose: 128 mg/dL

## 2019-06-20 LAB — PREPARE FRESH FROZEN PLASMA: Unit division: 0

## 2019-06-20 LAB — VITAMIN D 25 HYDROXY (VIT D DEFICIENCY, FRACTURES): Vit D, 25-Hydroxy: 25.7 ng/mL — ABNORMAL LOW (ref 30.0–100.0)

## 2019-06-20 LAB — PROTIME-INR
INR: 3.3 — ABNORMAL HIGH (ref 0.8–1.2)
Prothrombin Time: 33.2 seconds — ABNORMAL HIGH (ref 11.4–15.2)

## 2019-06-20 LAB — BPAM FFP
Blood Product Expiration Date: 202009132359
ISSUE DATE / TIME: 202009110842
Unit Type and Rh: 6200

## 2019-06-20 MED ORDER — OXYCODONE-ACETAMINOPHEN 5-325 MG PO TABS
1.0000 | ORAL_TABLET | Freq: Once | ORAL | Status: AC
  Administered 2019-06-20: 1 via ORAL
  Filled 2019-06-20: qty 1

## 2019-06-20 MED ORDER — OXYCODONE-ACETAMINOPHEN 5-325 MG PO TABS
1.0000 | ORAL_TABLET | Freq: Four times a day (QID) | ORAL | Status: DC | PRN
  Administered 2019-06-20 (×2): 1 via ORAL
  Administered 2019-06-21: 2 via ORAL
  Administered 2019-06-21 (×2): 1 via ORAL
  Administered 2019-06-21 – 2019-06-26 (×7): 2 via ORAL
  Administered 2019-06-26: 1 via ORAL
  Administered 2019-06-27: 2 via ORAL
  Filled 2019-06-20: qty 2
  Filled 2019-06-20: qty 1
  Filled 2019-06-20 (×2): qty 2
  Filled 2019-06-20: qty 1
  Filled 2019-06-20 (×4): qty 2
  Filled 2019-06-20: qty 1
  Filled 2019-06-20: qty 2
  Filled 2019-06-20: qty 1
  Filled 2019-06-20 (×3): qty 2

## 2019-06-20 NOTE — Progress Notes (Addendum)
Patient ID: Steven [name], male   DOB: April 11, 1961, 58 y.o.   MRN: [id number]     Subjective:  Patient reports pain as mild to moderate.  Patient sitting in the chair and in no acute distress.  He does complain of pain after PT  Objective:   VITALS:   Vitals:   06/19/19 2027 06/20/19 0117 06/20/19 0600 06/20/19 0954  BP: (!) 159/78 (!) 127/58 135/78 111/64  Pulse: (!) 101 98 88 88  Resp: 17 18 18 19   Temp: 97.7 F (36.5 C) 98.4 F (36.9 C) 99.5 F (37.5 C) 98.7 F (37.1 C)  TempSrc: Oral Oral Oral Oral  SpO2: 90% 93% 98% 96%  Weight:      Height:        ABD soft Sensation intact distally Dorsiflexion/Plantar flexion intact Incision: dressing C/D/I and scant drainage Prevena dressing intact  Splint intact  Lab Results  Component Value Date   WBC 5.6 06/20/2019   HGB 9.0 (L) 06/20/2019   HCT 29.1 (L) 06/20/2019   MCV 87.4 06/20/2019   PLT 133 (L) 06/20/2019   BMET    Component Value Date/Time   NA 136 06/19/2019 1503   K 3.8 06/19/2019 1503   CL 99 06/17/2019 0242   CO2 22 06/17/2019 0242   GLUCOSE 129 (H) 06/19/2019 1503   BUN 23 (H) 06/17/2019 0242   CREATININE 4.82 (H) 06/17/2019 0242   CREATININE 1.41 (H) 12/07/2011 1030   CALCIUM 8.3 (L) 06/17/2019 0242   GFRNONAA 12 (L) 06/17/2019 0242   GFRAA 14 (L) 06/17/2019 0242     Assessment/Plan: 1 Day Post-Op   Principal Problem:   Ankle fracture Active Problems:   Diabetes mellitus type 2 in obese ([place])   Hyperlipidemia   Morbid obesity (HCC)   Essential hypertension   End stage renal disease (HCC)   COPD, mild (HCC)   Advance diet Up with therapy NWB  Splint at all times prevena dressing  Add percocet for breakthrough pain  Lunette Stands 06/20/2019, 12:08 PM  Discussed and agree with above.   Marchia Bond, MD Cell [phone]

## 2019-06-20 NOTE — Progress Notes (Signed)
Progress note    ZAQUAN WHISLER [id number] DOB: 04-23-1961 DOA: 06/16/2019  PCP: Maryruth Hancock, MD   Patient coming from: Home   Chief Complaint: Right ankle pain.   HPI: Steven [name] is a 58 y.o. male with medical history significant of history of pulmonary embolism, alcohol abuse, ambulatory dysfunction, hypertension, COPD, type 2 diabetes mellitus and end-stage renal disease on hemodialysis. Patient uses a walker for ambulation.  Yesterday while trying to get bathroom he lost his balance while holding his walker and twisted his right ankle.  No head trauma no loss of consciousness.  Post trauma he had significant pain on his right ankle, sharp in nature, 10 out of 10 in intensity, worse with weightbearing, associated with local edema, no fevers, no chills.  No improving factors.  Yesterday he missed his hemodialysis session due to right ankle pain. Today he presented to the hospital due to persistent symptoms. In ED patient was diagnosed with right ankle fracture, orthopedics was consulted, recommendations for splinting, remain nonweightbearing to the right lower extremity and transferred to [facility], for further surgical evaluation.  Subjective: No acute issues or events overnight, pain currently well controlled; denies chest pain, shortness of breath, nausea, vomiting, diarrhea, constipation, headache, fevers, or chills.  Assessment/Plan Principal Problem:   Ankle fracture Active Problems:   Diabetes mellitus type 2 in obese (HCC)   Hyperlipidemia   Morbid obesity (HCC)   Essential hypertension   End stage renal disease (HCC)   COPD, mild (HCC)   Acute right ankle fracture after mechanical fall, POA   -Pain well controlled with IV morphine, status post ORIF on 06/19/2019 -Defer to orthopedic surgery for further evaluation treatment, DVT, pain management -PT OT to follow, likely discharge to SNF in the next 48 to 72 hours pending clinical course and bed availability   Supratherapeutic INR, history of pulmonary embolism on warfarin, resolving.  INR elevated 8.4 at intake Improved to 3.3 currently Defer to pharmacy for further warfarin dosing, appreciate insight and assistance  End-stage renal disease on hemodialysis MWF, stable.  -Nephrology following, appreciate insight and recommendations -Appears euvolemic, follow labs with dialysis  -Continue furosemide and spironolactone.   -Continue serum bicarbonate and PhosLo.  Hypertension, essential, well-controlled.   - Continue home medications including metoprolol, furosemide and spironolactone. - Borderline hypotension this afternoon, follow clinically, remains asymptomatic, non-tachycardic, likely in the setting of narcotics postsurgically  Insulin-dependent type 2 diabetes mellitus.   -Continue sliding scale insulin, hypoglycemic protocol  -Continue to hold basal insulin for now while n.p.o. perioperatively  COPD, not in acute exacerbation.   -Continue home nebs, currently oxygenating well on room air  Depression.  -Continue mirtazapine.  DVT prophylaxis: warfarin -currently on hold due to supratherapeutic INR as above and need for surgery -resume once cleared by surgery Code Status: full  Family Communication: no family at the bedside   Disposition Plan: Tentative disposition to SNF pending PT evaluation and treatment Consults called: Orthopedics  Admission status: Inpatient.  Continues to require IV pain medication in the setting of acute ankle fracture, postsurgical care, ongoing dialysis.  Pending PT evaluation possible disposition to SNF pending bed availability/insurance approval.  Physical Exam: Vitals:   06/19/19 1855 06/19/19 2027 06/20/19 0117 06/20/19 0600  BP: 129/69 (!) 159/78 (!) 127/58 135/78  Pulse: 84 (!) 101 98 88  Resp: 18 17 18 18   Temp: 97.7 F (36.5 C) 97.7 F (36.5 C) 98.4 F (36.9 C) 99.5 F (37.5 C)  TempSrc: Oral Oral Oral Oral  SpO2: 93% 90% 93% 98%  Weight:       Height:        Vitals:   06/19/19 1855 06/19/19 2027 06/20/19 0117 06/20/19 0600  BP: 129/69 (!) 159/78 (!) 127/58 135/78  Pulse: 84 (!) 101 98 88  Resp: 18 17 18 18   Temp: 97.7 F (36.5 C) 97.7 F (36.5 C) 98.4 F (36.9 C) 99.5 F (37.5 C)  TempSrc: Oral Oral Oral Oral  SpO2: 93% 90% 93% 98%  Weight:      Height:       General:  Pleasantly resting in bed, No acute distress. HEENT:  Normocephalic atraumatic.  Sclerae nonicteric, noninjected.  Extraocular movements intact bilaterally. Neck:  Without mass or deformity.  Trachea is midline. Lungs:  Clear to auscultate bilaterally without rhonchi, wheeze, or rales. Heart:  Regular rate and rhythm.  Without murmurs, rubs, or gallops. Abdomen:  Soft, nontender, nondistended.  Without guarding or rebound. Extremities: Right foot bandage clean dry intact. Vascular:  Dorsalis pedis and posterior tibial pulses palpable bilaterally. Skin:  Warm and dry, no erythema, no ulcerations.   Labs on Admission: I have personally reviewed following labs and imaging studies  CBC: Recent Labs  Lab 06/16/19 1453 06/17/19 0242 06/18/19 0121 06/19/19 0554 06/19/19 1503 06/20/19 0331  WBC 8.6 6.2 5.7 5.5  --  5.6  NEUTROABS 7.3  --   --   --   --   --   HGB 11.8* 10.1* 9.7* 10.6* 9.9* 9.0*  HCT 37.9* 31.4* 30.6* 33.6* 29.0* 29.1*  MCV 86.5 85.1 86.4 86.4  --  87.4  PLT 152 151 143* 143*  --  [id number]*   Basic Metabolic Panel: Recent Labs  Lab 06/16/19 1453 06/17/19 0242 06/19/19 1503  NA 132* 132* 136  K 4.9 4.2 3.8  CL 97* 99  --   CO2 26 22  --   GLUCOSE 81 106* 129*  BUN 23* 23*  --   CREATININE 4.38* 4.82*  --   CALCIUM 8.7* 8.3*  --     Liver Function Tests: Recent Labs  Lab 06/16/19 1453  AST 20  ALT 17  ALKPHOS 126  BILITOT 0.9  PROT 6.2*  ALBUMIN 2.9*   Coagulation Profile: Recent Labs  Lab 06/18/19 0121 06/18/19 1930 06/19/19 0554 06/19/19 1316 06/20/19 0331  INR 3.7* 3.2* 3.3* 3.2* 3.3*   CBG: Recent  Labs  Lab 06/19/19 1139 06/19/19 1404 06/19/19 1822 06/19/19 2203 06/20/19 0701  GLUCAP 174* 146* 145* 219* 240*   Urine analysis:    Component Value Date/Time   COLORURINE RED (A) 02/11/2019 1336   APPEARANCEUR HAZY (A) 02/11/2019 1336   LABSPEC 1.010 02/11/2019 1336   PHURINE 7.0 02/11/2019 1336   GLUCOSEU 250 (A) 02/11/2019 1336   HGBUR LARGE (A) 02/11/2019 1336   BILIRUBINUR NEGATIVE 02/11/2019 1336   KETONESUR TRACE (A) 02/11/2019 1336   PROTEINUR >300 (A) 02/11/2019 1336   NITRITE POSITIVE (A) 02/11/2019 1336   LEUKOCYTESUR SMALL (A) 02/11/2019 1336    Radiological Exams on Admission: Dg Ankle 2 Views Right  Result Date: 06/19/2019 CLINICAL DATA:  ORIF right ankle fracture. EXAM: RIGHT ANKLE - 2 VIEW; DG C-ARM 1-60 MIN COMPARISON:  Preoperative radiographs. FINDINGS: Thirteen fluoroscopic spot images from the operating room provided. Images demonstrate sequential lateral plate and multi screw fixation of distal tibial fracture. Plate and multi screw fixation including syndesmotic screws of the distal fibula. AP view of the proximal fibula demonstrates fibular neck fracture, age indeterminate on fluoroscopic spot  view. Total fluoroscopy time 1 minutes 22 seconds. IMPRESSION: Fluoroscopic spot images during ORIF of ankle fractures. Electronically Signed   By: Keith Rake M.D.   On: 06/19/2019 19:50   Dg Ankle Complete Right  Result Date: 06/19/2019 CLINICAL DATA:  ORIF ankle fractures. EXAM: RIGHT ANKLE - COMPLETE 3+ VIEW COMPARISON:  None. FINDINGS: Internal plate and screw fixation noted traversing distal tibial and fibular fractures, in near-anatomic alignment and position. No definite complicating features are identified. IMPRESSION: ORIF distal tibial and fibular fractures, in near-anatomic alignment and position. Electronically Signed   By: Margarette Canada M.D.   On: 06/19/2019 20:35   Dg C-arm 1-60 Min  Result Date: 06/19/2019 CLINICAL DATA:  ORIF right ankle fracture.  EXAM: RIGHT ANKLE - 2 VIEW; DG C-ARM 1-60 MIN COMPARISON:  Preoperative radiographs. FINDINGS: Thirteen fluoroscopic spot images from the operating room provided. Images demonstrate sequential lateral plate and multi screw fixation of distal tibial fracture. Plate and multi screw fixation including syndesmotic screws of the distal fibula. AP view of the proximal fibula demonstrates fibular neck fracture, age indeterminate on fluoroscopic spot view. Total fluoroscopy time 1 minutes 22 seconds. IMPRESSION: Fluoroscopic spot images during ORIF of ankle fractures. Electronically Signed   By: Keith Rake M.D.   On: 06/19/2019 19:50     Time spent: 35 minutes  Little Ishikawa DO [facility]  06/20/2019, 8:19 AM

## 2019-06-20 NOTE — Evaluation (Signed)
Physical Therapy Evaluation Patient Details Name: Steven [name] MRN: [id number] DOB: 1961/04/27 Today's Date: 06/20/2019   History of Present Illness  [name] is a 58 y.o. male with medical history significant of history of R CVA, pulmonary embolism, alcohol abuse, ambulatory dysfunction, hypertension, COPD, type 2 diabetes mellitus and end-stage renal disease on hemodialysis. Pt fell with RW and suffered R tibial pilon fx with malunion, underwent ORIF by Dr Marcelino Scot 06/19/19  Clinical Impression  Pt admitted with above diagnosis. Pt with h/o CVA with residual L sided weakness and lacks the strength to take full wt through UE's or hop on LLE. Therefore, he is unable to keep RLE NWB to ambulate. Due to safety issues, recommending SNF at d/c but pt reports he will not go there. If that continues to be the case, he will need to remain at transfers only level to w/c until he can WB.  Pt currently with functional limitations due to the deficits listed below (see PT Problem List). Pt will benefit from skilled PT to increase their independence and safety with mobility to allow discharge to the venue listed below.       Follow Up Recommendations SNF;Supervision for mobility/OOB    Equipment Recommendations  Wheelchair (measurements PT)    Recommendations for Other Services       Precautions / Restrictions Precautions Precautions: Fall Restrictions Weight Bearing Restrictions: Yes RLE Weight Bearing: Non weight bearing Other Position/Activity Restrictions: wound vac      Mobility  Bed Mobility Overal bed mobility: Needs Assistance Bed Mobility: Supine to Sit     Supine to sit: Min assist     General bed mobility comments: vc's and min HHA  Transfers Overall transfer level: Needs assistance Equipment used: Rolling walker (2 wheeled) Transfers: Sit to/from Stand Sit to Stand: Mod assist;+2 physical assistance         General transfer comment: had to have therapist's foot  under pt's R foot to prevent WB'ing RLE. Mod A for power up.   Ambulation/Gait Ambulation/Gait assistance: Mod assist;+2 physical assistance Gait Distance (Feet): 2 Feet Assistive device: Rolling walker (2 wheeled) Gait Pattern/deviations: Step-to pattern Gait velocity: decreased Gait velocity interpretation: <1.31 ft/sec, indicative of household ambulator General Gait Details: pt lacks sufficient UE strength to take full wt through arms and slide L foot fwd. He also lacks sufficient LLE strength to hop on LLE. Mod A needed to move 2' from bed to chair and pt not effectively keeping RLE NWB. Discussed the importance of NWB for healing and pt states he does not think he will be able to do that.   Stairs            Wheelchair Mobility    Modified Rankin (Stroke Patients Only)       Balance Overall balance assessment: Needs assistance;History of Falls Sitting-balance support: No upper extremity supported Sitting balance-Leahy Scale: Good     Standing balance support: Bilateral upper extremity supported Standing balance-Leahy Scale: Poor Standing balance comment: needs UE support as well as external assist                             Pertinent Vitals/Pain Pain Assessment: Faces Faces Pain Scale: Hurts even more Pain Location: R ankle Pain Descriptors / Indicators: Aching Pain Intervention(s): Limited activity within patient's tolerance;Monitored during session    Home Living Family/patient expects to be discharged to:: Private residence Living Arrangements: Alone Available Help at Discharge: Friend(s);Available PRN/intermittently  Type of Home: Mobile home Home Access: Ramped entrance     Home Layout: One level Home Equipment: [place] - 4 wheels;Cane - single point;Walker - standard Additional Comments: pt reports he has had falls at home but does not use walker with home. This time he grabbed his rollator to get up to go to bathroom because he was feeling  weak and pushed on it while it was not locked and fell getting up    Prior Function Level of Independence: Independent with assistive device(s)         Comments: Household and short distanced community ambulator with RW or Rollator     Hand Dominance   Dominant Hand: Right    Extremity/Trunk Assessment   Upper Extremity Assessment Upper Extremity Assessment: Defer to OT evaluation;Generalized weakness    Lower Extremity Assessment Lower Extremity Assessment: Generalized weakness;LLE deficits/detail LLE Deficits / Details: hip flex 3/5 but unable to elevate in midline, adducts with hip flexion, noted weakness of hip abductors, knee 3+/5 LLE Sensation: WNL LLE Coordination: decreased gross motor    Cervical / Trunk Assessment Cervical / Trunk Assessment: Kyphotic  Communication   Communication: No difficulties  Cognition Arousal/Alertness: Awake/alert Behavior During Therapy: Impulsive;WFL for tasks assessed/performed Overall Cognitive Status: Within Functional Limits for tasks assessed                                        General Comments      Exercises     Assessment/Plan    PT Assessment Patient needs continued PT services  PT Problem List Decreased strength;Decreased activity tolerance;Decreased balance;Decreased mobility;Decreased knowledge of use of DME;Decreased knowledge of precautions;Pain       PT Treatment Interventions DME instruction;Gait training;Functional mobility training;Therapeutic activities;Therapeutic exercise;Balance training;Neuromuscular re-education;Patient/family education    PT Goals (Current goals can be found in the Care Plan section)  Acute Rehab PT Goals Patient Stated Goal: go home, not go to rehab PT Goal Formulation: With patient Time For Goal Achievement: 07/04/19 Potential to Achieve Goals: Fair    Frequency Min 4X/week   Barriers to discharge Decreased caregiver support neighbor helps occasionally     Co-evaluation               AM-PAC PT "6 Clicks" Mobility  Outcome Measure Help needed turning from your back to your side while in a flat bed without using bedrails?: None Help needed moving from lying on your back to sitting on the side of a flat bed without using bedrails?: A Little Help needed moving to and from a bed to a chair (including a wheelchair)?: A Lot Help needed standing up from a chair using your arms (e.g., wheelchair or bedside chair)?: A Lot Help needed to walk in hospital room?: Total Help needed climbing 3-5 steps with a railing? : Total 6 Click Score: 13    End of Session Equipment Utilized During Treatment: Gait belt Activity Tolerance: Patient tolerated treatment well Patient left: in chair;with call bell/phone within reach;with chair alarm set Nurse Communication: Mobility status(transfer to L) PT Visit Diagnosis: Unsteadiness on feet (R26.81);Repeated falls (R29.6);Muscle weakness (generalized) (M62.81);History of falling (Z91.81);Difficulty in walking, not elsewhere classified (R26.2);Pain Pain - Right/Left: Right Pain - part of body: Ankle and joints of foot    Time: [id number] PT Time Calculation (min) (ACUTE ONLY): 23 min   Charges:   PT Evaluation $PT Eval Moderate Complexity: 1 Mod PT Treatments $  Therapeutic Activity: 8-22 mins        [place]  Pager [phone] Office [place] 06/20/2019, 10:59 AM

## 2019-06-20 NOTE — Op Note (Signed)
NAMEBURTON, Steven [name] MEDICAL RECORD [id number] ACCOUNT [id number] DATE OF BIRTH:1960-12-23 FACILITY: [facility] LOCATION: [facility] PHYSICIAN:Toriana Sponsel H. Levenia Skalicky, MD  OPERATIVE REPORT  DATE OF PROCEDURE:  06/19/2019  PREOPERATIVE DIAGNOSES: 1.  Right distal tibia pilon nonunion, tibia 2.  Right distal tibia pilon malunion, fibula. 3.  Possible syndesmotic disruption.  POSTOPERATIVE DIAGNOSES: 1.  Right distal tibia pilon nonunion, tibia second right distal tibia pilon malunion, fibula. 2.  Right distal tibia pilon malunion, fibula. 3.  Maisonneuve fracture with chronic syndesmotic disruption.  PROCEDURES: 1.  Open reduction internal fixation of right tibial pilon fracture nonunion, tibia only. 2.  Right distal tibia and fibula arthrodesis for repair of syndesmosis. 3.  Application of Prevena wound vacuum-assisted closure.  SURGEON:  Altamese Burnham, MD  ASSISTANT:  Ainsley Spinner, PA-C  ANESTHESIA:  General.  COMPLICATIONS:  None.  ESTIMATED BLOOD LOSS:  125 mL.  DISPOSITION:  To PACU.  CONDITION:  Stable.  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  The patient does not recall the inciting incident for his ankle pain, but has fallen multiple times over a long period of time and has developed difficulty with ambulation as a result.   X-rays demonstrated what appeared to be a subacute fracture with pattern consistent both with an effort toward healing as well as established nonunion with areas of sclerosis.  There was potential widening of the syndesmotic interval.  I discussed with  the patient, whose INR was severely elevated preoperatively including 8.4 on arrival from transfer, and then has been above 3 since that  time despite efforts by the primary service to reduce it.  He did receive FFP today and has been on the OR schedule  for the last several days and to preop as well.  He and I discussed the high risk of infection, bleeding, persistent malunion, symptomatic hardware, exacerbation of DVT or PE or intent not to use the tourniquet, potential for bleeding, which was  significantly elevated, and multiple others.  He strongly wished to proceed.  BRIEF SUMMARY OF PROCEDURE:  The patient was taken to the operating room where general anesthesia was induced.  We did place a tourniquet about the thigh but never inflated it during the procedure.  Chlorhexidine wash, Betadine scrub, and paint were  performed.  C-arm was brought in, and the correct starting point for the incision identified.  A 4 cm incision was made distally, and then we were able to identify the nonunion site which had intact periosteum over top of it which appeared well  vascularized.  We incised this longitudinally in the plane of the fracture and then entered it with a curette.  There was only partial sclerosis of the joint surface and no pseudoarthrosis, which was a favorable sign.  We took a series of curettes and  explored the fracture site anteriorly and posteriorly until I had scratched the surface back to fresh bone on the proximal and distal ends.  We then placed Infuse allograft with cancellous allograft into the void and placed 3 bicortical screws proximally  and 3 lock screws distally in the plafond.  These were checked carefully on AP, lateral, and mortise views.  We then stressed the ankle joint, which appeared to be wide and went proximally to evaluate where a spiral  fracture of the neck of the fibula  was identified, consistent with a Maisonneuve pattern.  Consequently, this was a chronic syndesmotic disruption.  We returned to the ankle and used a curette to remove and denude the cartilage on the inferior aspect of the fibula at this  level and also  remove the fibrinous material within the syndesmotic interval.  We did place a bone graft, including Infuse sponges and allograft, into this area and then squeezed it together with a large [employer] clamp and placed 4 syndesmotic tricortical screws to  maintain this reduction after carefully placing our plate and checking its position on AP, mortise, and lateral views.  Following this arthrodesis, final images were obtained.  Wound was irrigated thoroughly, and then a large Prevena wound VAC was placed  designed to stay there for 14 days given his lower extremity edema, significant medical problems, and concerns for soft tissue healing.  A posterior and stirrup splint was applied as well.  Ainsley Spinner, PA-C, was present and assisting throughout, and his  assistance was absolutely necessary given the technical nature of this procedure, both for positioning, exposure, and provisional definitive fixation.  There were no complications during the case.  Bleeding was obtained meticulously with electrocautery,  most of which occurred at the skin level and was not significant.  PROGNOSIS:  The patient has a multitude of factors that work against his ability to achieve union.  We are hopeful that he will go on to unite in spite of these.  He will be nonweightbearing for the next 8 weeks with weightbearing as tolerated  thereafter.  We will plan to see him back in the office in 14 days to remove his sutures.  We will continue to follow carefully with the medical service whose care is primary and greatly appreciated.  LN/NUANCE  D:06/19/2019 T:06/20/2019 JOB:[id number]

## 2019-06-20 NOTE — Progress Notes (Signed)
Patient ID: Steven [name], male   DOB: 1961-04-25, 58 y.o.   MRN: [id number]   [facility] Progress Note   Assessment/ Plan:   1.  Right ankle fracture:  Yesterday underwent ORIF of right distal tibia with right tibial/fibular arthrodesis and application of wound VAC.  Additional management plans per orthopedic surgery. 2.  End-stage renal disease on hemodialysis: Underwent hemodialysis off schedule on Thursday with plans to get hemodialysis today and resume usual Monday/Wednesday/Friday schedule next week.   3.  Hypertension: Blood pressure under fair control, monitor postoperatively for titration of antihypertensive therapy/adjustment of ultrafiltration. 4.  Anemia of chronic kidney disease: Likely compounded by recent osseous injury and associated surgical loss, will adjust ESA dosing and use PRBC transfusion if hemoglobin <7. 5.  Secondary hyperparathyroidism:  Calcium level within acceptable range, restart VDRA.  Subjective:   Reports a comfortable night after right ankle ORIF yesterday.  Denies any chest pain or shortness of breath..   Objective:   BP 135/78 (BP Location: Left Arm)   Pulse 88   Temp 99.5 F (37.5 C) (Oral)   Resp 18   Ht 5\' 11"  (1.803 m)   Wt 102.2 kg   SpO2 98%   BMI 31.42 kg/m   Physical Exam: [id number] sitting up in recliner with legs propped up [id number] regular rhythm and normal rate, normal s1 and s2 Resp:clear to auscultation bilaterally, no rales/rhonchi [id number], obese, NT [id number] leg in clean/dry cast and dressing with wound VAC in situ. Right BCF  Labs: BMET Recent Labs  Lab 06/16/19 1453 06/17/19 0242 06/19/19 1503  NA 132* 132* 136  K 4.9 4.2 3.8  CL 97* 99  --   CO2 26 22  --   GLUCOSE 81 106* 129*  BUN 23* 23*  --   CREATININE 4.38* 4.82*  --   CALCIUM 8.7* 8.3*  --    CBC Recent Labs  Lab 06/16/19 1453 06/17/19 0242 06/18/19 0121 06/19/19 0554 06/19/19 1503 06/20/19 0331  WBC 8.6 6.2 5.7 5.5  --   5.6  NEUTROABS 7.3  --   --   --   --   --   HGB 11.8* 10.1* 9.7* 10.6* 9.9* 9.0*  HCT 37.9* 31.4* 30.6* 33.6* 29.0* 29.1*  MCV 86.5 85.1 86.4 86.4  --  87.4  PLT 152 151 143* 143*  --  133*   Medications:    . sodium chloride   Intravenous Once  . calcium acetate  2,001 mg Oral TID WC  . Chlorhexidine Gluconate Cloth  6 each Topical Q0600  . furosemide  80 mg Oral Daily  . gabapentin  300 mg Oral QHS  . insulin aspart  0-9 Units Subcutaneous TID WC  . metoprolol tartrate  12.5 mg Oral BID  . mirtazapine  15 mg Oral QHS  . mupirocin ointment  1 application Nasal BID  . pantoprazole  40 mg Oral Daily  . sodium bicarbonate  650 mg Oral BID  . spironolactone  25 mg Oral Daily  . Warfarin - Pharmacist Dosing Inpatient   Does not apply [id number]   Elmarie Shiley, MD 06/20/2019, 9:50 AM

## 2019-06-20 NOTE — Progress Notes (Signed)
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 Codeine Nausea And Vomiting    Patient Measurements: Height: 5\' 11"  (180.3 cm) Weight: 225 lb 5 oz (102.2 kg) IBW/kg (Calculated) : 75.3  Vital Signs: Temp: 98.7 F (37.1 C) (09/12 0954) Temp Source: Oral (09/12 0954) BP: 111/64 (09/12 0954) Pulse Rate: 88 (09/12 0954)  Labs: Recent Labs    06/18/19 0121  06/19/19 0554 06/19/19 1316 06/19/19 1503 06/20/19 0331  HGB 9.7*  --  10.6*  --  9.9* 9.0*  HCT 30.6*  --  33.6*  --  29.0* 29.1*  PLT 143*  --  143*  --   --  133*  LABPROT 35.8*   < > 32.7* 32.4*  --  33.2*  INR 3.7*   < > 3.3* 3.2*  --  3.3*   < > = values in this interval not displayed.    Estimated Creatinine Clearance: 20.6 mL/min (A) (by C-G formula based on SCr of 4.82 mg/dL (H)).  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 PTA Coumadin: 7.5 mg TTSS, none on non-dialysis days.   He reports some recent bleeding when coming off HD. Last dose of Coumadin on 9/7. INR 8.4 on admit 9/8 with right ankle fracture.   Vitamin K 2.5 mg PO given on 9/8 and INR down but remained supratherapeutic. FFP given yesterday AM prior to trip to OR.  9/12: INR today 3.3, above goal.   Goal of Therapy:  INR 2-3 Monitor platelets by anticoagulation protocol: Yes   Plan:   Hold Coumadin again today.  Daily PT/INR.  Follow-up orthopedic team's plan for restarting anticoagulation   Thank you for the interesting consult and for involving pharmacy in this patient's care.  Tamela Gammon, PharmD 06/20/2019 12:02 PM PGY-2 Pharmacy Administration Resident Direct Phone: [phone] Please check [email or web address] for unit-specific pharmacist phone numbers

## 2019-06-20 NOTE — Procedures (Signed)
Patient seen on Hemodialysis. BP (!) 98/49 (BP Location: Left Arm)   Pulse 81   Temp 99 F (37.2 C) (Oral)   Resp 18   Ht 5\' 11"  (1.803 m)   Wt 102.2 kg   SpO2 93%   BMI 31.42 kg/m   QB 400, UF goal 3L Tolerating treatment without complaints at this time.  Elmarie Shiley MD [facility] Office # [phone] Pager # [phone] 1:54 PM

## 2019-06-20 NOTE — Anesthesia Postprocedure Evaluation (Signed)
Anesthesia Post Note  Patient: Steven [name]  Procedure(s) Performed: OPEN REDUCTION INTERNAL FIXATION (ORIF) ANKLE FRACTURE (Right Ankle) Repair Non-Union right tibia (Right Ankle)     Patient location during evaluation: PACU Anesthesia Type: General Level of consciousness: sedated Pain management: pain level controlled Vital Signs Assessment: post-procedure vital signs reviewed and stable Respiratory status: spontaneous breathing and respiratory function stable Cardiovascular status: stable Postop Assessment: no apparent nausea or vomiting Anesthetic complications: no    Last Vitals:  Vitals:   06/20/19 0117 06/20/19 0600  BP: (!) 127/58 135/78  Pulse: 98 88  Resp: 18 18  Temp: 36.9 C 37.5 C  SpO2: 93% 98%    Last Pain:  Vitals:   06/20/19 0640  TempSrc:   PainSc: 6    Pain Goal: Patients Stated Pain Goal: 3 (06/19/19 1056)                 [place]

## 2019-06-21 LAB — GLUCOSE, CAPILLARY
Glucose-Capillary: 280 mg/dL — ABNORMAL HIGH (ref 70–99)
Glucose-Capillary: 142 mg/dL — ABNORMAL HIGH (ref 70–99)
Glucose-Capillary: 235 mg/dL — ABNORMAL HIGH (ref 70–99)
Glucose-Capillary: 257 mg/dL — ABNORMAL HIGH (ref 70–99)

## 2019-06-21 LAB — PROTIME-INR
INR: 2.4 — ABNORMAL HIGH (ref 0.8–1.2)
Prothrombin Time: 25.6 seconds — ABNORMAL HIGH (ref 11.4–15.2)

## 2019-06-21 MED ORDER — INSULIN ASPART 100 UNIT/ML ~~LOC~~ SOLN
4.0000 [IU] | Freq: Once | SUBCUTANEOUS | Status: AC
  Administered 2019-06-21: 02:00:00 4 [IU] via SUBCUTANEOUS

## 2019-06-21 MED ORDER — WARFARIN SODIUM 7.5 MG PO TABS
7.5000 mg | ORAL_TABLET | Freq: Once | ORAL | Status: AC
  Administered 2019-06-21: 7.5 mg via ORAL
  Filled 2019-06-21: qty 1

## 2019-06-21 NOTE — Progress Notes (Signed)
Patient ID: Steven [name], male   DOB: Feb 26, 1961, 58 y.o.   MRN: [id number]  [facility] Progress Note   Assessment/ Plan:   1.  Right ankle fracture:  Yesterday underwent ORIF of right distal tibia with right tibial/fibular arthrodesis and application of wound VAC.  Additional management plans per orthopedic surgery. 2.  End-stage renal disease on hemodialysis: Underwent hemodialysis off schedule last week to accommodate surgery and will resume usual Monday/Wednesday/Friday schedule beginning tomorrow .    Discontinue renal diet, continue carb modified with fluid restriction. 3.  Hypertension: Blood pressure under fair control, monitor postoperatively for titration of antihypertensive therapy/adjustment of ultrafiltration. 4.  Anemia of chronic kidney disease: Likely compounded by recent osseous injury and associated surgical loss, will adjust ESA dosing and monitor hemoglobin/hematocrit trend. 5.  Secondary hyperparathyroidism:  Calcium level within acceptable range, restart VDRA.  Subjective:   Reports improving right leg pain/discomfort.  Disgruntled about being on renal diet and its limited dietary choices.   Objective:   BP 132/72 (BP Location: Left Arm)   Pulse 93   Temp 99.2 F (37.3 C) (Oral)   Resp 18   Ht 5\' 11"  (1.803 m)   Wt 104.2 kg   SpO2 (!) 88%   BMI 32.04 kg/m   Physical Exam: [id number] sitting up in chair, legs down [id number] regular rhythm and normal rate, normal s1 and s2 Resp:clear to auscultation bilaterally, no rales/rhonchi [id number], obese, NT [id number] leg in clean/dry cast and dressing with wound VAC in situ. Right BCF  Labs: BMET Recent Labs  Lab 06/16/19 1453 06/17/19 0242 06/19/19 1503  NA 132* 132* 136  K 4.9 4.2 3.8  CL 97* 99  --   CO2 26 22  --   GLUCOSE 81 106* 129*  BUN 23* 23*  --   CREATININE 4.38* 4.82*  --   CALCIUM 8.7* 8.3*  --    CBC Recent Labs  Lab 06/16/19 1453 06/17/19 0242 06/18/19 0121  06/19/19 0554 06/19/19 1503 06/20/19 0331  WBC 8.6 6.2 5.7 5.5  --  5.6  NEUTROABS 7.3  --   --   --   --   --   HGB 11.8* 10.1* 9.7* 10.6* 9.9* 9.0*  HCT 37.9* 31.4* 30.6* 33.6* 29.0* 29.1*  MCV 86.5 85.1 86.4 86.4  --  87.4  PLT 152 151 143* 143*  --  133*   Medications:    . sodium chloride   Intravenous Once  . calcium acetate  2,001 mg Oral TID WC  . Chlorhexidine Gluconate Cloth  6 each Topical Q0600  . furosemide  80 mg Oral Daily  . gabapentin  300 mg Oral QHS  . insulin aspart  0-9 Units Subcutaneous TID WC  . metoprolol tartrate  12.5 mg Oral BID  . mirtazapine  15 mg Oral QHS  . mupirocin ointment  1 application Nasal BID  . pantoprazole  40 mg Oral Daily  . sodium bicarbonate  650 mg Oral BID  . spironolactone  25 mg Oral Daily  . warfarin  7.5 mg Oral ONCE-1800  . Warfarin - Pharmacist Dosing Inpatient   Does not apply [id number]   Elmarie Shiley, MD 06/21/2019, 11:03 AM

## 2019-06-21 NOTE — Progress Notes (Addendum)
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 Codeine Nausea And Vomiting    Patient Measurements: Height: 5\' 11"  (180.3 cm) Weight: 229 lb 11.5 oz (104.2 kg) IBW/kg (Calculated) : 75.3  Vital Signs: Temp: 99.2 F (37.3 C) (09/13 0358) Temp Source: Oral (09/13 0358) BP: 132/72 (09/13 0358) Pulse Rate: 93 (09/13 0358)  Labs: Recent Labs    06/19/19 0554 06/19/19 1316 06/19/19 1503 06/20/19 0331 06/21/19 0322  HGB 10.6*  --  9.9* 9.0*  --   HCT 33.6*  --  29.0* 29.1*  --   PLT 143*  --   --  133*  --   LABPROT 32.7* 32.4*  --  33.2* 25.6*  INR 3.3* 3.2*  --  3.3* 2.4*    Estimated Creatinine Clearance: 20.8 mL/min (A) (by C-G formula based on SCr of 4.82 mg/dL (H)).  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 PTA Coumadin: 7.5 mg TTSS, none on non-dialysis days.  He reports some recent bleeding when coming off HD. Last dose of Coumadin on 9/7. INR 8.4 on admit 9/8 with right ankle fracture.  Vitamin K 2.5 mg PO given on 9/8 and INR down but remained supratherapeutic. FFP given in the morning prior to trip to OR. Patient's INR yesterday was 3.2.   9/13: Now INR at goal today at 2.4, after speaking with Ortho team: it is okay to resume Coumadin dosing. We will resume Coumadin at home dose regimen.  Goal of Therapy:  INR 2-3 Monitor platelets by anticoagulation protocol: Yes   Plan:   Coumadin 7.5mg  PO x1 today  Daily INR / CBC   Thank you for the interesting consult and for involving pharmacy in this patient's care.  Tamela Gammon, PharmD 06/21/2019 9:09 AM PGY-2 Pharmacy Administration Resident Direct Phone: [phone] Please check [email or web address] for unit-specific pharmacist phone numbers

## 2019-06-21 NOTE — Progress Notes (Signed)
Occupational Therapy Evaluation Patient Details Name: Steven [name] MRN: [id number] DOB: 01-13-1961 Today's Date: 06/21/2019    History of Present Illness Steven [name] is a 58 y.o. male with medical history significant of history of R CVA, pulmonary embolism, alcohol abuse, ambulatory dysfunction, hypertension, COPD, type 2 diabetes mellitus and end-stage renal disease on hemodialysis. Pt fell with RW and suffered R tibial pilon fx with malunion, underwent ORIF by Dr Marcelino Scot 06/19/19   Clinical Impression   PTA, pt lived alone in mobile home and was independent with use of RW or rollator for ADLs, IADLs, and driving following his recent stroke. Pt currently presents with impulsivity, poor balance, and poor adherence to NWB status for RLE. Pt performed lateral scoot transfer to drop arm recliner with min A +2 for safety and to maintain NWB on RLE. Pt performed sit<>stand with mod +2 for power up and balance while standing. Recommend dc to SNF and 24 supervision/assistance due to poor balance, safety, and impulsivity. Will follow acutely as admitted.       Follow Up Recommendations  Supervision/Assistance - 24 hour;SNF    Equipment Recommendations  3 in 1 bedside commode;Tub/shower bench(drop arm BSC)    Recommendations for Other Services PT consult     Precautions / Restrictions Precautions Precautions: Fall Restrictions Weight Bearing Restrictions: Yes RLE Weight Bearing: Non weight bearing Other Position/Activity Restrictions: wound vac      Mobility Bed Mobility Overal bed mobility: Modified Independent(increased time)             General bed mobility comments: required increased time  Transfers Overall transfer level: Needs assistance Equipment used: Rolling walker (2 wheeled);None(Performed lateral scoot transfer to recliner) Transfers: Lateral/Scoot Transfers;Sit to/from Stand Sit to Stand: Mod assist;+2 physical assistance(required mod A +2 for power up and poor  balance)        Lateral/Scoot Transfers: Min assist;+2 safety/equipment;From elevated surface(Required min A +2 for safety and to maintain NWB on RLE. ) General transfer comment: Had to have therapist foot under pts R foot to prevent WB RLE. Required mod A to power up    Balance Overall balance assessment: Needs assistance;History of Falls Sitting-balance support: No upper extremity supported;Feet supported Sitting balance-Leahy Scale: Good     Standing balance support: Bilateral upper extremity supported Standing balance-Leahy Scale: Poor Standing balance comment: needs UE support as well as external assist                           ADL either performed or assessed with clinical judgement   ADL Overall ADL's : Needs assistance/impaired Eating/Feeding: Independent;Sitting   Grooming: Wash/dry face;Independent;Bed level Grooming Details (indicate cue type and reason): Pt demonstrated independence for grooming at bed level, feel he could perform in sitting as well  Upper Body Bathing: Sitting;Modified independent   Lower Body Bathing: Moderate assistance;Sitting/lateral leans;Cueing for safety   Upper Body Dressing : Modified independent;Sitting(mod I for increased time)   Lower Body Dressing: Maximal assistance;Cueing for safety;Sitting/lateral leans   Toilet Transfer: Minimal assistance;+2 for safety/equipment;Cueing for safety;Requires drop arm(Simulated by scooting to recliner chair) Toilet Transfer Details (indicate cue type and reason): Required min A +2 to manage wound vac, safety, and maintain NWB status.  Toileting- Clothing Manipulation and Hygiene: Moderate assistance;Sitting/lateral lean       Functional mobility during ADLs: Minimal assistance;+2 for safety/equipment;Cueing for safety(Pt able to perform lateral scooting with min A for NWB) General ADL Comments: Pt required cues throughout for safety  and to maintain NWB status     Vision Baseline  Vision/History: Wears glasses Wears Glasses: (Pt reports he should wear glasses for driving, but doesnt) Patient Visual Report: No change from baseline Vision Assessment?: No apparent visual deficits     Perception     Praxis      Pertinent Vitals/Pain Pain Assessment: 0-10 Pain Score: 8  Pain Location: R lower leg- wound vac site Pain Descriptors / Indicators: Aching;Discomfort;Grimacing Pain Intervention(s): Monitored during session;Patient requesting pain meds-RN notified;Repositioned     Hand Dominance Right   Extremity/Trunk Assessment Upper Extremity Assessment Upper Extremity Assessment: Overall WFL for tasks assessed   Lower Extremity Assessment Lower Extremity Assessment: Defer to PT evaluation   Cervical / Trunk Assessment Cervical / Trunk Assessment: Kyphotic   Communication Communication Communication: No difficulties   Cognition Arousal/Alertness: Awake/alert(Initially sleeping, took a little while to wake up) Behavior During Therapy: [facility] for tasks assessed/performed;Impulsive Overall Cognitive Status: Within Functional Limits for tasks assessed Area of Impairment: Safety/judgement                         Safety/Judgement: Decreased awareness of safety;Decreased awareness of deficits(Decreased awareness of WB)     General Comments: Pt demonstrated poor safety and impulsivity during session. Feel he is close to baseline levels   General Comments  Pt made jokes throughout session    Exercises     Shoulder Instructions      Home Living Family/patient expects to be discharged to:: Private residence Living Arrangements: Alone Available Help at Discharge: Other (Comment)(pt reports friends and family not available) Type of Home: Mobile home Home Access: Ramped entrance     Home Layout: One level     Bathroom Shower/Tub: Teacher, early years/pre: Handicapped height     Home Equipment: Environmental consultant - 4 wheels;Cane - single  point;Walker - standard   Additional Comments: pt reports he has had falls at home but does not use walker with home. This time he grabbed his rollator to get up to go to bathroom because he was feeling weak and pushed on it while it was not locked and fell getting up      Prior Functioning/Environment Level of Independence: Independent with assistive device(s)        Comments: Used RW/rollator for functional ambulation. Reports independence with ADLs, IADLs, and driving post stroke        OT Problem List: Decreased strength;Decreased safety awareness;Decreased knowledge of use of DME or AE;Decreased knowledge of precautions;Pain      OT Treatment/Interventions: Self-care/ADL training;DME and/or AE instruction;Patient/family education    OT Goals(Current goals can be found in the care plan section) Acute Rehab OT Goals Patient Stated Goal: go home OT Goal Formulation: With patient Time For Goal Achievement: 07/05/19 Potential to Achieve Goals: Fair  OT Frequency: Min 2X/week   Barriers to D/C: Decreased caregiver support  Pt reports living alone and not having anyone that could provide assistance/supervision       Co-evaluation              AM-PAC OT "6 Clicks" Daily Activity     Outcome Measure Help from another person eating meals?: None Help from another person taking care of personal grooming?: A Little Help from another person toileting, which includes using toliet, bedpan, or urinal?: A Lot Help from another person bathing (including washing, rinsing, drying)?: A Lot Help from another person to put on and taking off regular upper body clothing?:  A Little Help from another person to put on and taking off regular lower body clothing?: A Lot 6 Click Score: 16   End of Session Equipment Utilized During Treatment: Gait belt;Rolling walker Nurse Communication: Mobility status;Patient requests pain meds  Activity Tolerance: Patient tolerated treatment well Patient  left: in chair;with call bell/phone within reach  OT Visit Diagnosis: History of falling (Z91.81);Pain Pain - Right/Left: Right Pain - part of body: Leg(lower leg- wound vac site)                Time: [id number] OT Time Calculation (min): 48 min Charges:  OT General Charges $OT Visit: 1 Visit OT Evaluation $OT Eval Moderate Complexity: 1 Mod OT Treatments $Self Care/Home Management : 23-37 mins  Steven [name], OT Student  Steven [name] 06/21/2019, 11:44 AM

## 2019-06-21 NOTE — Plan of Care (Signed)
  Problem: Health Behavior/Discharge Planning: Goal: Ability to manage health-related needs will improve Outcome: Progressing   

## 2019-06-21 NOTE — Progress Notes (Addendum)
Patient ID: Steven [name], male   DOB: 1961/05/21, 58 y.o.   MRN: [id number]     Subjective:  Patient reports pain as mild to moderate.  Patient in the chair and in no acute distress.    Objective:   VITALS:   Vitals:   06/20/19 1813 06/20/19 2010 06/21/19 0146 06/21/19 0358  BP: 132/63 (!) 120/54 (!) 125/57 132/72  Pulse: 81 89 88 93  Resp: 18 18 18 18   Temp: 98.5 F (36.9 C) 99.1 F (37.3 C) 98.8 F (37.1 C) 99.2 F (37.3 C)  TempSrc: Oral Oral Oral Oral  SpO2: 99% 97% 95% (!) 88%  Weight:      Height:        ABD soft Sensation intact distally Dorsiflexion/Plantar flexion intact Incision: dressing C/D/I and scant drainage Wound vac in place  Lab Results  Component Value Date   WBC 5.6 06/20/2019   HGB 9.0 (L) 06/20/2019   HCT 29.1 (L) 06/20/2019   MCV 87.4 06/20/2019   PLT 133 (L) 06/20/2019   BMET    Component Value Date/Time   NA 136 06/19/2019 1503   K 3.8 06/19/2019 1503   CL 99 06/17/2019 0242   CO2 22 06/17/2019 0242   GLUCOSE 129 (H) 06/19/2019 1503   BUN 23 (H) 06/17/2019 0242   CREATININE 4.82 (H) 06/17/2019 0242   CREATININE 1.41 (H) 12/07/2011 1030   CALCIUM 8.3 (L) 06/17/2019 0242   GFRNONAA 12 (L) 06/17/2019 0242   GFRAA 14 (L) 06/17/2019 0242     Assessment/Plan: 2 Days Post-Op   Principal Problem:   Ankle fracture Active Problems:   Diabetes mellitus type 2 in obese ([place])   Hyperlipidemia   Morbid obesity (HCC)   Essential hypertension   End stage renal disease (HCC)   COPD, mild (HCC)   Up with therapy  Continue plan per medicine NWB right lower ext Plan for prevena tomorrow    Lunette Stands 06/21/2019, 11:00 AM  Discussed and agree with above.  Okay to resume anticoagulation with Coumadin.  Marchia Bond, MD Cell [phone]

## 2019-06-21 NOTE — Progress Notes (Signed)
Progress note    Steven [name] [id number] DOB: [birth year] DOA: 06/16/2019  PCP: Maryruth Hancock, MD   Patient coming from: Home   Chief Complaint: Right ankle pain.   HPI: Steven [name] is a 58 y.o. male with medical history significant of history of pulmonary embolism, alcohol abuse, ambulatory dysfunction, hypertension, COPD, type 2 diabetes mellitus and end-stage renal disease on hemodialysis. Patient uses a walker for ambulation.  Yesterday while trying to get bathroom he lost his balance while holding his walker and twisted his right ankle.  No head trauma no loss of consciousness.  Post trauma he had significant pain on his right ankle, sharp in nature, 10 out of 10 in intensity, worse with weightbearing, associated with local edema, no fevers, no chills.  No improving factors.  Yesterday he missed his hemodialysis session due to right ankle pain. Today he presented to the hospital due to persistent symptoms. In ED patient was diagnosed with right ankle fracture, orthopedics was consulted, recommendations for splinting, remain nonweightbearing to the right lower extremity and transferred to [facility], for further surgical evaluation.  Subjective: No acute issues or events overnight, pain currently well controlled; denies chest pain, shortness of breath, nausea, vomiting, diarrhea, constipation, headache, fevers, or chills.  Patient's main complaint at this time remains his diet, patient continues to have issues ordering food he wants due to dietary restrictions.  Assessment/Plan Principal Problem:   Ankle fracture Active Problems:   Diabetes mellitus type 2 in obese (HCC)   Hyperlipidemia   Morbid obesity (HCC)   Essential hypertension   End stage renal disease (HCC)   COPD, mild (HCC)   Acute right ankle fracture after mechanical fall, POA   -Pain well controlled with current regimen, status post ORIF on 06/19/2019 -wound VAC intact, without leak -Defer to orthopedic surgery  for further evaluation treatment, DVT, pain management -PT OT to follow, likely discharge to SNF in the next 48 to 72 hours pending clinical course and bed availability  Supratherapeutic INR, history of pulmonary embolism on warfarin, resolving.  -INR elevated 8.4 at intake -Improved to 2.4 -Orthopedics okay with resuming home Coumadin dose operatively -defer to pharmacy for dosing -Patient likely needs further education about diet given warfarin, patient requesting spinach and leafy greens with meals, will have dietary/nutrition follow-up before discharge  End-stage renal disease on hemodialysis MWF, stable.  -Nephrology following, appreciate insight and recommendations -Appears euvolemic, follow labs with dialysis  -Continue furosemide and spironolactone.   -Continue serum bicarbonate and PhosLo -now off renal diet  Hypertension, essential, well-controlled.   - Continue home medications including metoprolol, furosemide and spironolactone. - Borderline hypotension this afternoon, follow clinically, remains asymptomatic, non-tachycardic, likely in the setting of narcotics postsurgically  Insulin-dependent type 2 diabetes mellitus.   -Continue sliding scale insulin, hypoglycemic protocol  -Continue to hold basal insulin for now while n.p.o. perioperatively  COPD, not in acute exacerbation.   -Continue home nebs, currently oxygenating well on room air  Depression.  -Continue mirtazapine.  DVT prophylaxis: warfarin -currently on hold due to supratherapeutic INR as above and need for surgery -resume once cleared by surgery Code Status: full  Family Communication: no family at the bedside   Disposition Plan: Tentative disposition to SNF pending bed availability and insurance approval Consults called: Orthopedics  Admission status: Inpatient.  Continues to require postsurgical care, ongoing dialysis, and physical therapy given poor ambulation given nonweightbearing status per orthopedic  surgery.  Physical Exam: Vitals:   06/20/19 1813 06/20/19 2010 06/21/19  0146 06/21/19 0358  BP: 132/63 (!) 120/54 (!) 125/57 132/72  Pulse: 81 89 88 93  Resp: 18 18 18 18   Temp: 98.5 F (36.9 C) 99.1 F (37.3 C) 98.8 F (37.1 C) 99.2 F (37.3 C)  TempSrc: Oral Oral Oral Oral  SpO2: 99% 97% 95% (!) 88%  Weight:      Height:        Vitals:   06/20/19 1813 06/20/19 2010 06/21/19 0146 06/21/19 0358  BP: 132/63 (!) 120/54 (!) 125/57 132/72  Pulse: 81 89 88 93  Resp: 18 18 18 18   Temp: 98.5 F (36.9 C) 99.1 F (37.3 C) 98.8 F (37.1 C) 99.2 F (37.3 C)  TempSrc: Oral Oral Oral Oral  SpO2: 99% 97% 95% (!) 88%  Weight:      Height:       General:  Pleasantly resting in bed, No acute distress. HEENT:  Normocephalic atraumatic.  Sclerae nonicteric, noninjected.  Extraocular movements intact bilaterally. Neck:  Without mass or deformity.  Trachea is midline. Lungs:  Clear to auscultate bilaterally without rhonchi, wheeze, or rales. Heart:  Regular rate and rhythm.  Without murmurs, rubs, or gallops. Abdomen:  Soft, nontender, nondistended.  Without guarding or rebound. Extremities: Right foot bandage clean dry intact. Vascular:  Dorsalis pedis and posterior tibial pulses palpable bilaterally. Skin:  Warm and dry, no erythema, no ulcerations.   Labs on Admission: I have personally reviewed following labs and imaging studies  CBC: Recent Labs  Lab 06/16/19 1453 06/17/19 0242 06/18/19 0121 06/19/19 0554 06/19/19 1503 06/20/19 0331  WBC 8.6 6.2 5.7 5.5  --  5.6  NEUTROABS 7.3  --   --   --   --   --   HGB 11.8* 10.1* 9.7* 10.6* 9.9* 9.0*  HCT 37.9* 31.4* 30.6* 33.6* 29.0* 29.1*  MCV 86.5 85.1 86.4 86.4  --  87.4  PLT 152 151 143* 143*  --  [id number]*   Basic Metabolic Panel: Recent Labs  Lab 06/16/19 1453 06/17/19 0242 06/19/19 1503  NA 132* 132* 136  K 4.9 4.2 3.8  CL 97* 99  --   CO2 26 22  --   GLUCOSE 81 106* 129*  BUN 23* 23*  --   CREATININE 4.38* 4.82*  --    CALCIUM 8.7* 8.3*  --     Liver Function Tests: Recent Labs  Lab 06/16/19 1453  AST 20  ALT 17  ALKPHOS 126  BILITOT 0.9  PROT 6.2*  ALBUMIN 2.9*   Coagulation Profile: Recent Labs  Lab 06/18/19 1930 06/19/19 0554 06/19/19 1316 06/20/19 0331 06/21/19 0322  INR 3.2* 3.3* 3.2* 3.3* 2.4*   CBG: Recent Labs  Lab 06/20/19 0701 06/20/19 1150 06/20/19 1809 06/20/19 2011 06/21/19 0728  GLUCAP 240* 188* 123* 222* 280*   Urine analysis:    Component Value Date/Time   COLORURINE RED (A) 02/11/2019 1336   APPEARANCEUR HAZY (A) 02/11/2019 1336   LABSPEC 1.010 02/11/2019 1336   PHURINE 7.0 02/11/2019 1336   GLUCOSEU 250 (A) 02/11/2019 1336   HGBUR LARGE (A) 02/11/2019 1336   BILIRUBINUR NEGATIVE 02/11/2019 1336   KETONESUR TRACE (A) 02/11/2019 1336   PROTEINUR >300 (A) 02/11/2019 1336   NITRITE POSITIVE (A) 02/11/2019 1336   LEUKOCYTESUR SMALL (A) 02/11/2019 1336    Radiological Exams on Admission: Dg Ankle 2 Views Right  Result Date: 06/19/2019 CLINICAL DATA:  ORIF right ankle fracture. EXAM: RIGHT ANKLE - 2 VIEW; DG C-ARM 1-60 MIN COMPARISON:  Preoperative radiographs. FINDINGS: Thirteen  fluoroscopic spot images from the operating room provided. Images demonstrate sequential lateral plate and multi screw fixation of distal tibial fracture. Plate and multi screw fixation including syndesmotic screws of the distal fibula. AP view of the proximal fibula demonstrates fibular neck fracture, age indeterminate on fluoroscopic spot view. Total fluoroscopy time 1 minutes 22 seconds. IMPRESSION: Fluoroscopic spot images during ORIF of ankle fractures. Electronically Signed   By: Keith Rake M.D.   On: 06/19/2019 19:50   Dg Ankle Complete Right  Result Date: 06/19/2019 CLINICAL DATA:  ORIF ankle fractures. EXAM: RIGHT ANKLE - COMPLETE 3+ VIEW COMPARISON:  None. FINDINGS: Internal plate and screw fixation noted traversing distal tibial and fibular fractures, in near-anatomic  alignment and position. No definite complicating features are identified. IMPRESSION: ORIF distal tibial and fibular fractures, in near-anatomic alignment and position. Electronically Signed   By: Margarette Canada M.D.   On: 06/19/2019 20:35   Dg C-arm 1-60 Min  Result Date: 06/19/2019 CLINICAL DATA:  ORIF right ankle fracture. EXAM: RIGHT ANKLE - 2 VIEW; DG C-ARM 1-60 MIN COMPARISON:  Preoperative radiographs. FINDINGS: Thirteen fluoroscopic spot images from the operating room provided. Images demonstrate sequential lateral plate and multi screw fixation of distal tibial fracture. Plate and multi screw fixation including syndesmotic screws of the distal fibula. AP view of the proximal fibula demonstrates fibular neck fracture, age indeterminate on fluoroscopic spot view. Total fluoroscopy time 1 minutes 22 seconds. IMPRESSION: Fluoroscopic spot images during ORIF of ankle fractures. Electronically Signed   By: Keith Rake M.D.   On: 06/19/2019 19:50     Time spent: 35 minutes  Little Ishikawa DO [facility]  06/21/2019, 7:53 AM

## 2019-06-22 ENCOUNTER — Encounter (HOSPITAL_COMMUNITY): Payer: Self-pay | Admitting: Orthopedic Surgery

## 2019-06-22 LAB — CBC
HCT: 27.4 % — ABNORMAL LOW (ref 39.0–52.0)
HCT: 29.8 % — ABNORMAL LOW (ref 39.0–52.0)
Hemoglobin: 8.3 g/dL — ABNORMAL LOW (ref 13.0–17.0)
Hemoglobin: 9.3 g/dL — ABNORMAL LOW (ref 13.0–17.0)
MCH: 27.2 pg (ref 26.0–34.0)
MCH: 27.6 pg (ref 26.0–34.0)
MCHC: 30.3 g/dL (ref 30.0–36.0)
MCHC: 31.2 g/dL (ref 30.0–36.0)
MCV: 89.8 fL (ref 80.0–100.0)
MCV: 88.4 fL (ref 80.0–100.0)
Platelets: 159 10*3/uL (ref 150–400)
Platelets: 164 10*3/uL (ref 150–400)
RBC: 3.05 MIL/uL — ABNORMAL LOW (ref 4.22–5.81)
RBC: 3.37 MIL/uL — ABNORMAL LOW (ref 4.22–5.81)
RDW: 19.9 % — ABNORMAL HIGH (ref 11.5–15.5)
RDW: 19.3 % — ABNORMAL HIGH (ref 11.5–15.5)
WBC: 4.1 10*3/uL (ref 4.0–10.5)
WBC: 5.3 10*3/uL (ref 4.0–10.5)
nRBC: 0 % (ref 0.0–0.2)
nRBC: 0 % (ref 0.0–0.2)

## 2019-06-22 LAB — RENAL FUNCTION PANEL
Albumin: 2 g/dL — ABNORMAL LOW (ref 3.5–5.0)
Anion gap: 10 (ref 5–15)
BUN: 29 mg/dL — ABNORMAL HIGH (ref 6–20)
CO2: 27 mmol/L (ref 22–32)
Calcium: 8.1 mg/dL — ABNORMAL LOW (ref 8.9–10.3)
Chloride: 96 mmol/L — ABNORMAL LOW (ref 98–111)
Creatinine, Ser: 4.7 mg/dL — ABNORMAL HIGH (ref 0.61–1.24)
GFR calc Af Amer: 15 mL/min — ABNORMAL LOW (ref >60)
GFR calc non Af Amer: 13 mL/min — ABNORMAL LOW (ref >60)
Glucose, Bld: 222 mg/dL — ABNORMAL HIGH (ref 70–99)
Phosphorus: 2 mg/dL — ABNORMAL LOW (ref 2.5–4.6)
Potassium: 3.9 mmol/L (ref 3.5–5.1)
Sodium: 133 mmol/L — ABNORMAL LOW (ref 135–145)

## 2019-06-22 LAB — GLUCOSE, CAPILLARY
Glucose-Capillary: 224 mg/dL — ABNORMAL HIGH (ref 70–99)
Glucose-Capillary: 249 mg/dL — ABNORMAL HIGH (ref 70–99)
Glucose-Capillary: 243 mg/dL — ABNORMAL HIGH (ref 70–99)
Glucose-Capillary: 210 mg/dL — ABNORMAL HIGH (ref 70–99)

## 2019-06-22 LAB — BASIC METABOLIC PANEL
Anion gap: 12 (ref 5–15)
BUN: 17 mg/dL (ref 6–20)
CO2: 26 mmol/L (ref 22–32)
Calcium: 8.3 mg/dL — ABNORMAL LOW (ref 8.9–10.3)
Chloride: 96 mmol/L — ABNORMAL LOW (ref 98–111)
Creatinine, Ser: 2.55 mg/dL — ABNORMAL HIGH (ref 0.61–1.24)
GFR calc Af Amer: 31 mL/min — ABNORMAL LOW (ref >60)
GFR calc non Af Amer: 27 mL/min — ABNORMAL LOW (ref >60)
Glucose, Bld: 243 mg/dL — ABNORMAL HIGH (ref 70–99)
Potassium: 3.7 mmol/L (ref 3.5–5.1)
Sodium: 134 mmol/L — ABNORMAL LOW (ref 135–145)

## 2019-06-22 LAB — PROTIME-INR
INR: 1.8 — ABNORMAL HIGH (ref 0.8–1.2)
Prothrombin Time: 20.8 seconds — ABNORMAL HIGH (ref 11.4–15.2)

## 2019-06-22 MED ORDER — DOXERCALCIFEROL 4 MCG/2ML IV SOLN
1.0000 ug | INTRAVENOUS | Status: DC
  Administered 2019-06-24 – 2019-06-26 (×2): 1 ug via INTRAVENOUS
  Filled 2019-06-22 (×2): qty 2

## 2019-06-22 MED ORDER — PRO-STAT SUGAR FREE PO LIQD
30.0000 mL | Freq: Two times a day (BID) | ORAL | Status: DC
  Administered 2019-06-22 – 2019-06-27 (×9): 30 mL via ORAL
  Filled 2019-06-22 (×9): qty 30

## 2019-06-22 MED ORDER — RENA-VITE PO TABS
1.0000 | ORAL_TABLET | Freq: Every day | ORAL | Status: DC
  Administered 2019-06-22 – 2019-06-26 (×5): 1 via ORAL
  Filled 2019-06-22 (×5): qty 1

## 2019-06-22 MED ORDER — DARBEPOETIN ALFA 100 MCG/0.5ML IJ SOSY
100.0000 ug | PREFILLED_SYRINGE | INTRAMUSCULAR | Status: DC
  Administered 2019-06-24: 100 ug via INTRAVENOUS
  Filled 2019-06-22: qty 0.5

## 2019-06-22 MED ORDER — SENNOSIDES-DOCUSATE SODIUM 8.6-50 MG PO TABS
1.0000 | ORAL_TABLET | Freq: Two times a day (BID) | ORAL | Status: DC
  Administered 2019-06-22 – 2019-06-25 (×7): 1 via ORAL
  Filled 2019-06-22 (×9): qty 1

## 2019-06-22 MED ORDER — POLYETHYLENE GLYCOL 3350 17 G PO PACK
17.0000 g | PACK | Freq: Every day | ORAL | Status: DC
  Administered 2019-06-22 – 2019-06-24 (×3): 17 g via ORAL
  Filled 2019-06-22 (×5): qty 1

## 2019-06-22 MED ORDER — MIDODRINE HCL 5 MG PO TABS
ORAL_TABLET | ORAL | Status: AC
  Filled 2019-06-22: qty 2

## 2019-06-22 NOTE — Progress Notes (Signed)
Progress note    Steven [name] [id number] DOB: Aug 21, 1961 DOA: 06/16/2019  PCP: Maryruth Hancock, MD   Patient coming from: Home   Chief Complaint: Right ankle pain.   HPI: Steven [name] is a 58 y.o. male with medical history significant of history of pulmonary embolism, alcohol abuse, ambulatory dysfunction, hypertension, COPD, type 2 diabetes mellitus and end-stage renal disease on hemodialysis. Patient uses a walker for ambulation.  Yesterday while trying to get bathroom he lost his balance while holding his walker and twisted his right ankle.  No head trauma no loss of consciousness.  Post trauma he had significant pain on his right ankle, sharp in nature, 10 out of 10 in intensity, worse with weightbearing, associated with local edema, no fevers, no chills.  No improving factors.  Yesterday he missed his hemodialysis session due to right ankle pain. Today he presented to the hospital due to persistent symptoms. In ED patient was diagnosed with right ankle fracture, orthopedics was consulted, recommendations for splinting, remain nonweightbearing to the right lower extremity and transferred to [facility], for further surgical evaluation.  Subjective: No acute issues or events overnight, pain currently well controlled, otherwise tolerating dialysis well this morning.  Declines chest pain, shortness of breath, nausea, vomiting, diarrhea, constipation, headache, fever, chills.  Assessment/Plan Principal Problem:   Ankle fracture Active Problems:   Diabetes mellitus type 2 in obese (HCC)   Hyperlipidemia   Morbid obesity (HCC)   Essential hypertension   End stage renal disease (HCC)   COPD, mild (HCC)   Acute right ankle fracture after mechanical fall, POA   -Pain well controlled with current regimen, status post ORIF on 06/19/2019 -wound VAC intact, without leak -Defer to orthopedic surgery for further evaluation treatment, DVT, pain management -PT OT to follow, likely discharge to  SNF in the next 48 to 72 hours pending clinical course and bed availability  Supratherapeutic INR, history of pulmonary embolism on warfarin, resolving.  -INR elevated 8.4 at intake -INR currently pending today, issues with lab draw -Orthopedics okay with resuming home Coumadin dose operatively -defer to pharmacy for dosing -Patient likely needs further education about diet given warfarin, patient requesting spinach and leafy greens with meals, will have dietary/nutrition follow-up before discharge  End-stage renal disease on hemodialysis MWF, stable.  -Nephrology following, appreciate insight and recommendations -Appears euvolemic, follow labs with dialysis  -Continue furosemide and spironolactone.   -Continue serum bicarbonate and PhosLo -now off renal diet  Hypertension, essential, well-controlled.   - Continue home medications including metoprolol, furosemide and spironolactone. - Borderline hypotension this afternoon, follow clinically, remains asymptomatic, non-tachycardic, likely in the setting of narcotics postsurgically  Insulin-dependent type 2 diabetes mellitus.   -Continue sliding scale insulin, hypoglycemic protocol  -Continue to hold basal insulin for now while n.p.o. perioperatively  COPD, not in acute exacerbation.   -Continue home nebs, currently oxygenating well on room air  Depression.  -Continue mirtazapine.  DVT prophylaxis: warfarin -currently on hold due to supratherapeutic INR as above and need for surgery -resume once cleared by surgery Code Status: full  Family Communication: no family at the bedside   Disposition Plan: Tentative disposition to SNF pending bed availability and insurance approval Consults called: Orthopedics  Admission status: Inpatient.  Continues to require postsurgical care, ongoing dialysis, and physical therapy given poor ambulation given nonweightbearing status per orthopedic surgery.  Physical Exam: Vitals:   06/22/19 1000 06/22/19  1030 06/22/19 1100 06/22/19 1117  BP: (!) 120/53 (!) 109/54 (!) 114/57 Marland Kitchen)  117/52  Pulse: 76 74 72 72  Resp:    16  Temp:    98.1 F (36.7 C)  TempSrc:    Oral  SpO2:    96%  Weight:    106.8 kg  Height:        Vitals:   06/22/19 1000 06/22/19 1030 06/22/19 1100 06/22/19 1117  BP: (!) 120/53 (!) 109/54 (!) 114/57 (!) 117/52  Pulse: 76 74 72 72  Resp:    16  Temp:    98.1 F (36.7 C)  TempSrc:    Oral  SpO2:    96%  Weight:    106.8 kg  Height:       General:  Pleasantly resting in bed, No acute distress. HEENT:  Normocephalic atraumatic.  Sclerae nonicteric, noninjected.  Extraocular movements intact bilaterally. Neck:  Without mass or deformity.  Trachea is midline. Lungs:  Clear to auscultate bilaterally without rhonchi, wheeze, or rales. Heart:  Regular rate and rhythm.  Without murmurs, rubs, or gallops. Abdomen:  Soft, nontender, nondistended.  Without guarding or rebound. Extremities: Right foot bandage clean dry intact. Vascular:  Dorsalis pedis and posterior tibial pulses palpable bilaterally. Skin:  Warm and dry, no erythema, no ulcerations.   Labs on Admission: I have personally reviewed following labs and imaging studies  CBC: Recent Labs  Lab 06/16/19 1453 06/17/19 0242 06/18/19 0121 06/19/19 0554 06/19/19 1503 06/20/19 0331 06/22/19 0802  WBC 8.6 6.2 5.7 5.5  --  5.6 4.1  NEUTROABS 7.3  --   --   --   --   --   --   HGB 11.8* 10.1* 9.7* 10.6* 9.9* 9.0* 8.3*  HCT 37.9* 31.4* 30.6* 33.6* 29.0* 29.1* 27.4*  MCV 86.5 85.1 86.4 86.4  --  87.4 89.8  PLT 152 151 143* 143*  --  133* [id number]   Basic Metabolic Panel: Recent Labs  Lab 06/16/19 1453 06/17/19 0242 06/19/19 1503 06/22/19 0802  NA 132* 132* 136 133*  K 4.9 4.2 3.8 3.9  CL 97* 99  --  96*  CO2 26 22  --  27  GLUCOSE 81 106* 129* 222*  BUN 23* 23*  --  29*  CREATININE 4.38* 4.82*  --  4.70*  CALCIUM 8.7* 8.3*  --  8.1*  PHOS  --   --   --  2.0*    Liver Function Tests: Recent Labs   Lab 06/16/19 1453 06/22/19 0802  AST 20  --   ALT 17  --   ALKPHOS 126  --   BILITOT 0.9  --   PROT 6.2*  --   ALBUMIN 2.9* 2.0*   Coagulation Profile: Recent Labs  Lab 06/18/19 1930 06/19/19 0554 06/19/19 1316 06/20/19 0331 06/21/19 0322  INR 3.2* 3.3* 3.2* 3.3* 2.4*   CBG: Recent Labs  Lab 06/21/19 1156 06/21/19 1711 06/21/19 2022 06/22/19 0648 06/22/19 1336  GLUCAP 142* 235* 257* 224* 249*   Urine analysis:    Component Value Date/Time   COLORURINE RED (A) 02/11/2019 1336   APPEARANCEUR HAZY (A) 02/11/2019 1336   LABSPEC 1.010 02/11/2019 1336   PHURINE 7.0 02/11/2019 1336   GLUCOSEU 250 (A) 02/11/2019 1336   HGBUR LARGE (A) 02/11/2019 1336   BILIRUBINUR NEGATIVE 02/11/2019 1336   KETONESUR TRACE (A) 02/11/2019 1336   PROTEINUR >300 (A) 02/11/2019 1336   NITRITE POSITIVE (A) 02/11/2019 1336   LEUKOCYTESUR SMALL (A) 02/11/2019 1336    Radiological Exams on Admission: No results found.   Time spent: 55  minutes  Little Ishikawa DO [facility]  06/22/2019, 1:45 PM

## 2019-06-22 NOTE — Progress Notes (Signed)
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 Codeine Nausea And Vomiting    Patient Measurements: Height: 5\' 11"  (180.3 cm) Weight: 235 lb 7.2 oz (106.8 kg) IBW/kg (Calculated) : 75.3  Vital Signs: Temp: 98.5 F (36.9 C) (09/14 1346) Temp Source: Oral (09/14 1346) BP: 147/75 (09/14 1346) Pulse Rate: 80 (09/14 1346)  Labs: Recent Labs    06/20/19 0331 06/21/19 0322 06/22/19 0802  HGB 9.0*  --  8.3*  HCT 29.1*  --  27.4*  PLT 133*  --  159  LABPROT 33.2* 25.6*  --   INR 3.3* 2.4*  --   CREATININE  --   --  4.70*    Estimated Creatinine Clearance: 21.6 mL/min (A) (by C-G formula based on SCr of 4.7 mg/dL (H)).  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 PTA Coumadin: 7.5 mg TTSS, none on dialysis days.  He reports some recent bleeding when coming off HD. Last PTA dose of Coumadin on 9/7. INR 8.4 on admit 9/8 with right ankle fracture.  Vitamin K 2.5 mg PO given on 9/8 and INR down but remained supratherapeutic. FFP given in the morning prior to trip to OR. Patient's INR yesterday was 2.4.   9/13: No INR drawn today. We will resume Coumadin at home dose regimen, therefore, no warfarin today.   Goal of Therapy:  INR 2-3 Monitor platelets by anticoagulation protocol: Yes   Plan:   Hold warfarin today per home dose.   Daily INR / CBC   Amarie Tarte A. Levada Dy, PharmD, BCPS, FNKF Clinical Pharmacist [place] Please utilize Amion for appropriate phone number to reach the unit pharmacist ([place])

## 2019-06-22 NOTE — Progress Notes (Signed)
Initial Nutrition Assessment  DOCUMENTATION CODES:   Obesity unspecified  INTERVENTION:   -30 ml Prostat BID, each supplement provides 100 kcals and 15 grams protein -Renal MVI daily -Provided "Diet Tips for Diabetes with Kidney Disease" from [place] and "Vitamin K and Nutrition" handout from [facility] Nutrition Care Manual   NUTRITION DIAGNOSIS:   Increased nutrient needs related to post-op healing as evidenced by estimated needs.  GOAL:   Patient will meet greater than or equal to 90% of their needs  MONITOR:   PO intake, Supplement acceptance, Labs, Weight trends, Skin, I & O's  REASON FOR ASSESSMENT:   Consult Diet education  ASSESSMENT:   Steven [name] is a 58 y.o. male with medical history significant of history of pulmonary embolism, alcohol abuse, ambulatory dysfunction, hypertension, COPD, type 2 diabetes mellitus and end-stage renal disease on hemodialysis.  Pt admitted with rt ankle fracture.   9/11- s/p Procedure(s): 1. OPEN REDUCTION INTERNAL FIXATION (ORIF) RIGHT TIBIA PILON FRACTURE NONUNION, TIBIA ONLY (Right) 2. RIGHT TIBIA AND FIBULA ARTHRODESIS 3. APPLICATION OF PROVENA WOUND VAC  Reviewed I/O's: +770 ml x 24 hours and -2.3 L since admission  UOP: 30 ml x 24 hours  Pt out of room at time of visit. Unable to speak with pt at time to obtain additional history.   RD consulted due to concern of high INR, requesting leafy greens, and being dissatisfied with renal diet. Noted diet has now been liberalized to carb modified. Meal completion 65-100%.   Wt has been stable over the past 6 months. Unsure of dry weight.   Pt with increased nutrient needs due to post-operative healing and wound vac. Therapies recommending SNF. Pt would benefit from addition of oral nutritional supplements to help meet nutritional needs.   Patients receiving warfarin should consume a consistent amount of vitamin K intake in diet. Hospital menu is designed to allow patients to  consume a consistent amount of vitamin K per home regimen.  Lab Results  Component Value Date   HGBA1C 6.1 (H) 06/19/2019   PTA DM medications are 3-15 units insulin aspart TID with meals and 9 units insulin detemir daily.   Labs reviewed: Na: 133, CBGS: 142-257 (inpatient orders for glycemic control are 0-9 units insulin aspart TID with meals).   Diet Order:   Diet Order            Diet Carb Modified Fluid consistency: Thin; Room service appropriate? Yes; Fluid restriction: 1200 mL Fluid  Diet effective now              EDUCATION NEEDS:   No education needs have been identified at this time  Skin:  Skin Assessment: Skin Integrity Issues: Skin Integrity Issues:: Wound VAC Wound Vac: rt leg  Last BM:  06/18/19  Height:   Ht Readings from Last 1 Encounters:  06/16/19 5\' 11"  (1.803 m)    Weight:   Wt Readings from Last 1 Encounters:  06/22/19 109 kg    Ideal Body Weight:  78.2 kg  BMI:  Body mass index is 33.52 kg/m.  Estimated Nutritional Needs:   Kcal:  2150-2350  Protein:  115-130 grams  Fluid:  1000 ml +UOP    Steven [name] A. Jimmye Norman, RD, LDN, [place] Registered Dietitian II Certified Diabetes Care and Education Specialist Pager: [phone] After hours Pager: [phone]

## 2019-06-22 NOTE — Progress Notes (Addendum)
Subjective:  Seen on HD , No Cos, Tolerating UF   Objective Vital signs in last 24 hours: Vitals:   06/22/19 1000 06/22/19 1030 06/22/19 1100 06/22/19 1117  BP: (!) 120/53 (!) 109/54 (!) 114/57 (!) 117/52  Pulse: 76 74 72 72  Resp:    16  Temp:    98.1 F (36.7 C)  TempSrc:    Oral  SpO2:    96%  Weight:    106.8 kg  Height:       Weight change:   Physical Exam: Gen: alert male , NAD on HD  Card : regular rhythm and normal rate, no rub  Resp:clear to auscultation bilaterally, no rales/rhonchi [id number], obese, NT [id number] leg in clean/dry cast and dressing with wound VAC in situ. / Left no pedal edema  Hemodialysis Access =Right BCF patent on HD    OP HD MWF x 4 hours; [facility]  2K /2.5 CA  [address] 3000 HEP BOLUS AND 2250 HRLY EDW 103  kg Max UF 1.5 L/hr R BCF Arm  Qb 400 / Qd 800  Epogen 6000u tiw Hectorol 36mcg tiw  Problem/Plan:  1. Right ankle fracture:  underwent 06/20/19  ORIF of right distal tibia with right tibial/fibular arthrodesis and application of wound VAC.  Additional management plans per orthopedic surgery. 2. End-stage renal disease on hemodialysis: HD MWF Schedule as OP /Underwent hemodialysis off schedule last week to accommodate surgery and now will resume usual Yesterday 9/13 Dr Posey Pronto    Discontinue renal diet, continue carb modified with fluid restriction. 3. Hypertension /Vol :  Uf gl 2.5 today  And blood pressure under fair control, monitor postoperatively for titration of antihypertensive therapy/adjustment of ultrafiltration. 4. Anemia of chronic kidney disease:HGB  8.3  Likely compounded by recent osseous injury and associated surgical loss, will adjust ESA dosing and monitor hemoglobin  trend. 5. Secondary hyperparathyroidism: Calcium level within acceptable range, restart VDRA.Phos 2.0, =hold phosl  [place]  Ernest Haber, PA-C [facility] Beeper [phone] 06/22/2019,12:47 PM  LOS: 6 days   Pt  seen, examined and agree w A/P as above.  Kelly Splinter  MD 06/22/2019, 3:32 PM    Labs: Basic Metabolic Panel: Recent Labs  Lab 06/16/19 1453 06/17/19 0242 06/19/19 1503 06/22/19 0802  NA 132* 132* 136 133*  K 4.9 4.2 3.8 3.9  CL 97* 99  --  96*  CO2 26 22  --  27  GLUCOSE 81 106* 129* 222*  BUN 23* 23*  --  29*  CREATININE 4.38* 4.82*  --  4.70*  CALCIUM 8.7* 8.3*  --  8.1*  PHOS  --   --   --  2.0*   Liver Function Tests: Recent Labs  Lab 06/16/19 1453 06/22/19 0802  AST 20  --   ALT 17  --   ALKPHOS 126  --   BILITOT 0.9  --   PROT 6.2*  --   ALBUMIN 2.9* 2.0*   No results for input(s): LIPASE, AMYLASE in the last 168 hours. No results for input(s): AMMONIA in the last 168 hours. CBC: Recent Labs  Lab 06/16/19 1453 06/17/19 0242 06/18/19 0121 06/19/19 0554 06/19/19 1503 06/20/19 0331 06/22/19 0802  WBC 8.6 6.2 5.7 5.5  --  5.6 4.1  NEUTROABS 7.3  --   --   --   --   --   --   HGB 11.8* 10.1* 9.7* 10.6* 9.9* 9.0* 8.3*  HCT 37.9* 31.4* 30.6* 33.6* 29.0* 29.1* 27.4*  MCV 86.5 85.1 86.4 86.4  --  87.4 89.8  PLT 152 151 143* 143*  --  133* 159   Cardiac Enzymes: No results for input(s): CKTOTAL, CKMB, CKMBINDEX, TROPONINI in the last 168 hours. CBG: Recent Labs  Lab 06/21/19 0728 06/21/19 1156 06/21/19 1711 06/21/19 2022 06/22/19 0648  GLUCAP 280* 142* 235* 257* 224*    Studies/Results: No results found. Medications:  . sodium chloride   Intravenous Once  . calcium acetate  2,001 mg Oral TID WC  . Chlorhexidine Gluconate Cloth  6 each Topical Q0600  . feeding supplement (PRO-STAT SUGAR FREE 64)  30 mL Oral BID  . furosemide  80 mg Oral Daily  . gabapentin  300 mg Oral QHS  . insulin aspart  0-9 Units Subcutaneous TID WC  . metoprolol tartrate  12.5 mg Oral BID  . mirtazapine  15 mg Oral QHS  . multivitamin  1 tablet Oral QHS  . mupirocin ointment  1 application Nasal BID  . pantoprazole  40 mg Oral Daily  . sodium bicarbonate  650 mg  Oral BID  . spironolactone  25 mg Oral Daily  . Warfarin - Pharmacist Dosing Inpatient   Does not apply [phone]

## 2019-06-22 NOTE — Progress Notes (Signed)
OT Cancellation Note  Patient Details Name: Steven [name] MRN: [id number] DOB: 26-Jul-1961   Cancelled Treatment:    Reason Eval/Treat Not Completed: Patient at procedure or test/ unavailable(Pt at hemodialysis, will return as schedule allows.)  Lequita Halt, OT Student  06/22/2019, 9:32 AM

## 2019-06-22 NOTE — Progress Notes (Signed)
Administrative Assistant/H. Wilborn from OP HD clinic/[facility] requests update on patient. Renal Navigator faxed most recent MD progress note to provide continuity of care.  Alphonzo Cruise, [place] Renal Navigator [phone]

## 2019-06-22 NOTE — Discharge Instructions (Addendum)
[facility] Discharge Instructions  General Discharge Instructions  WEIGHT BEARING STATUS:  Nonweightbearing right leg   RANGE OF MOTION/ACTIVITY: ok to move knee and toes. Do not remove splint   Wound Care: do not remove splint. Keep splint clean and dry. We will remove at your first follow up appointment. Make sure Prevena wound vac always has a good battery charge.  Plug to the outlet when not moving around if possible. Ok to disconnect from power source when moving around   Diet: as you were eating previously.  Can use over the counter stool softeners and bowel preparations, such as Miralax, to help with bowel movements.  Narcotics can be constipating.  Be sure to drink plenty of fluids  PAIN MEDICATION USE AND EXPECTATIONS  You have likely been given narcotic medications to help control your pain.  After a traumatic event that results in an fracture (broken bone) with or without surgery, it is ok to use narcotic pain medications to help control one's pain.  We understand that everyone responds to pain differently and each individual patient will be evaluated on a regular basis for the continued need for narcotic medications. Ideally, narcotic medication use should last no more than 6-8 weeks (coinciding with fracture healing).   As a patient it is your responsibility as well to monitor narcotic medication use and report the amount and frequency you use these medications when you come to your office visit.   We would also advise that if you are using narcotic medications, you should take a dose prior to therapy to maximize you participation.  IF YOU ARE ON NARCOTIC MEDICATIONS IT IS NOT PERMISSIBLE TO OPERATE A MOTOR VEHICLE (MOTORCYCLE/CAR/TRUCK/MOPED) OR HEAVY MACHINERY DO NOT MIX NARCOTICS WITH OTHER CNS (CENTRAL NERVOUS SYSTEM) DEPRESSANTS SUCH AS ALCOHOL   STOP SMOKING OR USING NICOTINE PRODUCTS!!!!  As discussed nicotine severely impairs your body's ability to heal  surgical and traumatic wounds but also impairs bone healing.  Wounds and bone heal by forming microscopic blood vessels (angiogenesis) and nicotine is a vasoconstrictor (essentially, shrinks blood vessels).  Therefore, if vasoconstriction occurs to these microscopic blood vessels they essentially disappear and are unable to deliver necessary nutrients to the healing tissue.  This is one modifiable factor that you can do to dramatically increase your chances of healing your injury.    (This means no smoking, no nicotine gum, patches, etc)  DO NOT USE NONSTEROIDAL ANTI-INFLAMMATORY DRUGS (NSAID'S)  Using products such as Advil (ibuprofen), Aleve (naproxen), Motrin (ibuprofen) for additional pain control during fracture healing can delay and/or prevent the healing response.  If you would like to take over the counter (OTC) medication, Tylenol (acetaminophen) is ok.  However, some narcotic medications that are given for pain control contain acetaminophen as well. Therefore, you should not exceed more than 4000 mg of tylenol in a day if you do not have liver disease.  Also note that there are may OTC medicines, such as cold medicines and allergy medicines that my contain tylenol as well.  If you have any questions about medications and/or interactions please ask your doctor/PA or your pharmacist.      ICE AND ELEVATE INJURED/OPERATIVE EXTREMITY  Using ice and elevating the injured extremity above your heart can help with swelling and pain control.  Icing in a pulsatile fashion, such as 20 minutes on and 20 minutes off, can be followed.    Do not place ice directly on skin. Make sure there is a barrier between to  skin and the ice pack.    Using frozen items such as frozen peas works well as the conform nicely to the are that needs to be iced.  USE AN ACE WRAP OR TED HOSE FOR SWELLING CONTROL  In addition to icing and elevation, Ace wraps or TED hose are used to help limit and resolve swelling.  It is  recommended to use Ace wraps or TED hose until you are informed to stop.    When using Ace Wraps start the wrapping distally (farthest away from the body) and wrap proximally (closer to the body)   Example: If you had surgery on your leg or thing and you do not have a splint on, start the ace wrap at the toes and work your way up to the thigh        If you had surgery on your upper extremity and do not have a splint on, start the ace wrap at your fingers and work your way up to the upper arm  IF YOU ARE IN A SPLINT OR CAST DO NOT [place]   If your splint gets wet for any reason please contact the office immediately. You may shower in your splint or cast as long as you keep it dry.  This can be done by wrapping in a cast cover or garbage back (or similar)  Do Not stick any thing down your splint or cast such as pencils, money, or hangers to try and scratch yourself with.  If you feel itchy take benadryl as prescribed on the bottle for itching  IF YOU ARE IN A CAM BOOT (BLACK BOOT)  You may remove boot periodically. Perform daily dressing changes as noted below.  Wash the liner of the boot regularly and wear a sock when wearing the boot. It is recommended that you sleep in the boot until told otherwise    Call office for the following:  Temperature greater than 101F  Persistent nausea and vomiting  Severe uncontrolled pain  Redness, tenderness, or signs of infection (pain, swelling, redness, odor or green/yellow discharge around the site)  Difficulty breathing, headache or visual disturbances  Hives  Persistent dizziness or light-headedness  Extreme fatigue  Any other questions or concerns you may have after discharge  In an emergency, call 911 or go to an Emergency Department at a nearby hospital    [place]: [phone]   VISIT OUR WEBSITE FOR ADDITIONAL INFORMATION: [email or web address]       Vitamin K and  Medications   What Do I Need to Know? If your doctor has prescribed coumadin or Warfarin to thin your blood, you need to watch how much vitamin K you get from food and dietary supplements.  Why Was Warfarin (Coumadin) Prescribed? Coumadin or Warfarin interferes with vitamin K so that your blood clots more slowly. The doctor uses a test called INR to make sure that your blood will not clot too quickly or too slowly. Changing how much vitamin K you get can change your INR. This change could result in bleeding or an unwanted blood clot.  How Does Warfarin Work? Take your medicine exactly as your doctor directed. Keep your vitamin K intake about the same. It is as simple as 1-2-3: Keep your intake of high vitamin K foods consistent. You might plan to eat no more than  cup of these foods per day. If you like these foods and eat them often, you can eat  more, but you should be consistent. For example, you could eat about a cup of one of these foods on most days. Do not have large changes in the medium vitamin K foods you eat. For instance, it would not be wise to eat coleslaw at every meal and then stop eating it entirely. Make careful decisions about dietary supplements. You can take a daily multivitamin (many contain 25 micrograms of vitamin K/tablet). If you do not regularly eat green vegetables, a multi-vitamin can be helpful. Remember to do it every day. Do not take supplements that contain large amounts of vitamin K (more than 100 micrograms/day). Which Foods Have Vitamin K? Food Amount Vitamin K (mcg) Kale, cooked  cup 531 Spinach, cooked  cup 444 Collard greens, cooked  cup 418 Mustard greens, cooked  cup 210 Spinach, raw 1 cup 145 Broccoli, cooked  cup 110 Brussels sprouts  cup cooked 109 Lettuce, green leaf, raw 1 cup 97 Cabbage, cooked  cup 81 Lettuce, romaine, raw 1 cup 57 Asparagus 4 spears 48 Broccoli, raw  cup 45 Kiwi fruit 1 medium 31 Blackberries or blueberries,  raw 1 cup 29 Pickles, cucumber, dill or kosher dill 1 pickle 25 Grapes (red or green) 1 cup 23 Peas, cooked  cup 19 Supplements Dietary supplements can affect how your blood clots. Use only supplements approved by your physician or registered dietitian.  Generally, it is not wise to take vitamin E or fish oil supplements.  Herbal supplements to avoid include alfalfa, arnica, bilberry, butchers broom, cats claw, dong quai, feverfew, forskolin, garlic, ginger, ginkgo, horse chestnut, inositol hexaphosphate, licorice, meililot (sweet clover), pau darco, red clover, [place] wort, sweet woodruff, turmeric, willow bark, and wheat grass.  Your registered dietitian may restrict cranberry and grapefruit juice to no more than one to two servings per day since some studies have reported an increase in the effect of Warfarin. One serving equals 4 ounces juice or 1/2 grapefruit.   Copyright 2020  [employer]y [employer]. All rights reserved.  Diet Tips for Diabetics with Kidney Disease Diet is one of the most important treatments in managing diabetes and kidney disease. If Theresia Majors been diagnosed with kidney disease as a result of diabetes, youll need to work with a dietitian to create an eating plan thats right for you. This plan will help manage your blood glucose levels and reduce the amount of waste and fluid your kidneys process.  Which nutrients do I need to regulate? Your dietitian will give you nutritional guidelines that tell you how much protein, fat and carbohydrate you can eat, as well as how much potassium, phosphorus and sodium you can have each day. Because your diet needs to be lower in these minerals, youll limit or avoid certain foods, while planning your meals.  Portion control is also important. Talk to your dietitian regarding tips for accurately measuring a serving size. What may be measured as one serving on a regular diet may count as three servings on the kidney  diet.  Your doctor and dietitian will also recommend you eat meals and snacks of the same size and calorie/carbohydrate content at certain times of the day to keep your blood glucose at an even level. .Its important to check blood glucose levels often and share the results with your doctor.  What can I eat? Below is an example of food choices that are usually recommended on a typical renal diabetic diet. This list is based on sodium, potassium, phosphorus and high sugar content  of foods included. Ask your dietitian if you can have any of these listed foods and make sure you know what the recommended serving size should be.  Carbohydrate Foods Milk and nondairy  RECOMMENDED AVOID Skim or fat-free milk, non-dairy creamer, plain yogurt, sugar-free yogurt, sugar-free pudding, sugar-free ice cream, sugar-free nondairy frozen desserts*  *Portions of dairy products are often limited to 4 ounces due to high protein, potassium or phosphorus content  Chocolate milk, buttermilk, sweetened yogurt, sugar sweetened pudding, sugar sweetened ice cream, sugar sweetened nondairy frozen desserts  Breads and starches  RECOMMENDED AVOID White, wheat, rye, sourdough, whole wheat and whole grain bread, unsweetened, refined dry cereals, cream of wheat, grits, malt-o-meal, noodles, white or whole wheat pasta, rice, bagel (small), hamburger bun, unsalted crackers, cornbread (made from scratch), flour tortilla  Bran bread, frosted or sugar-coated cereals, instant cereals, bran or granola, gingerbread, pancake mix, cornbread mix, biscuits, salted snacks including: potato chips, corn chips and crackers Whole wheat cereals like wheat flakes and raisin bran, oatmeal, and whole grain hot cereals contain more phosphorus and potassium than refined products.  Fruits and juices  RECOMMENDED AVOID Apples, apple juice, applesauce, apricot halves, berries including: strawberries, raspberries, cranberries, blackberries and  blueberries, low sugar cranberry juice, cherries, fruit cocktail, grapefruit, grapes, grape juice, kumquats, mandarin oranges, pears, pineapple, plums, tangerine, watermelon, fruit canned in unsweetened juice  Avocados, bananas, cantaloupe, dried fruits including: dates, raisins and prunes, fresh pears, honeydew melon, kiwis, kumquats, star fruit, mangos, papaya, nectarines, oranges and orange juice, pomegranate, fruit canned in syrup  Starchy vegetables  RECOMMENDED AVOID Corn, peas, mixed vegetables with corn and peas (eat these less often because they are high in phosphorus), potatoes (soaked to reduce potassium, if needed)  Baked potatoes, sweet potatoes, yams, baked beans, dried beans (kidneys, lima , lentil, pinto or soy), succotash, pumpkin, winter squash  Non-starchy vegetables  RECOMMENDED AVOID Asparagus, beets, broccoli, Brussels sprouts, carrots, cabbage, cauliflower, celery, cucumber, eggplant, frozen broccoli cuts, green beans, iceberg lettuce, kale, leeks, mustard greens, okra, onions, red and green peppers, radishes, raw spinach (1/2 cup), snow peas, summer squash, turnips  Artichoke, fresh bamboo shoots, beet greens, cactus, cooked Chinese cabbage, kohlrabi, rutabagas, sauerkraut , cooked spinach, tomatoes, tomato sauce or paste, tomato juice, vegetable juice  Higher-protein foods Meats, cheeses and eggs  RECOMMENDED AVOID Lean cuts of meat, poultry, fish and seafood; eggs, low cholesterol egg substitute; cottage cheese (limited due to high sodium content)  Bacon, canned and luncheon meats, cheeses, hot dogs, organ meats, nuts, pepperoni, salami, salmon, sausage  Higher-fat foods Seasoning and calories  RECOMMENDED AVOID Soft or tub margarine low in trans fats, mayonnaise, sour cream, cream cheese, low fat mayonnaise, low fat sour cream, low fat cream cheese  Bacon fat, back fat, butter, Crisco, lard, shortening, margarines high in trans fats, whipping  cream  Beverages RECOMMENDED AVOID Water, diet clear sodas, homemade tea or lemonade sweetened with an artificial sweetener  Regular or diet dark colas, beer, fruit juices, fruit-flavored drinks or water sweetened with fruit juices, bottled or canned iced tea or lemonade containing sugar, syrup, or phosphoric acid; tea or lemonade sweetened with real sugar  You may also be instructed to limit or avoid the following sweet and salty foods: Candy Chocolate Regular sugar Syrup Honey Molasses Baked goods Ice cream Canned foods Condiments Onion, garlic or table salt TV dinners Meat tenderizer Marinades Nuts Pizza Salted chips and snacks [email or web address]

## 2019-06-22 NOTE — Progress Notes (Addendum)
PT Cancellation Note  Patient Details Name: Steven [name] MRN: [id number] DOB: [birth year]   Cancelled Treatment:    Reason Eval/Treat Not Completed: Patient at procedure or test/unavailable. Pt in HD. PT to re-attempt as time allows.  1310 addendum: Re-attempted PT session as pt is back from HD. Pt declining due to fatigue.   Steven [name] 06/22/2019, 8:24 AM   Lorrin Goodell, PT  Office # [phone] Pager [phone]

## 2019-06-23 ENCOUNTER — Ambulatory Visit: Payer: Medicare Other | Admitting: "Endocrinology

## 2019-06-23 LAB — GLUCOSE, CAPILLARY
Glucose-Capillary: 252 mg/dL — ABNORMAL HIGH (ref 70–99)
Glucose-Capillary: 201 mg/dL — ABNORMAL HIGH (ref 70–99)
Glucose-Capillary: 90 mg/dL (ref 70–99)
Glucose-Capillary: 216 mg/dL — ABNORMAL HIGH (ref 70–99)

## 2019-06-23 LAB — PROTIME-INR
INR: 2 — ABNORMAL HIGH (ref 0.8–1.2)
Prothrombin Time: 22.5 seconds — ABNORMAL HIGH (ref 11.4–15.2)

## 2019-06-23 MED ORDER — WARFARIN SODIUM 7.5 MG PO TABS
7.5000 mg | ORAL_TABLET | Freq: Once | ORAL | Status: AC
  Administered 2019-06-23: 7.5 mg via ORAL
  Filled 2019-06-23: qty 1

## 2019-06-23 MED ORDER — INSULIN DETEMIR 100 UNIT/ML ~~LOC~~ SOLN
10.0000 [IU] | Freq: Every day | SUBCUTANEOUS | Status: DC
  Administered 2019-06-23 – 2019-06-27 (×4): 10 [IU] via SUBCUTANEOUS
  Filled 2019-06-23 (×5): qty 0.1

## 2019-06-23 MED ORDER — INSULIN DETEMIR 100 UNIT/ML ~~LOC~~ SOLN: 9.0000 [IU] | Freq: Every day | SUBCUTANEOUS | Status: DC

## 2019-06-23 NOTE — Progress Notes (Addendum)
Physical Therapy Treatment Patient Details Name: Steven [name] MRN: [id number] DOB: 07-28-1961 Today's Date: 06/23/2019    History of Present Illness Steven [name] is a 58 y.o. male with medical history significant of history of R CVA, pulmonary embolism, alcohol abuse, ambulatory dysfunction, hypertension, COPD, type 2 diabetes mellitus and end-stage renal disease on hemodialysis. Pt fell with RW and suffered R tibial pilon fx with malunion, underwent ORIF by Dr Marcelino Scot 06/19/19    PT Comments    Pt supine in bed on arrival.  Focus on session spent on transfer training as he is refusing SNF placement and adamant to return home alone.  He will need significant DME listed below for transfers to assist in maintaining weight bearing and improving ease of mobility.  Pt continues unable to transfer without assistance but is adamant to go home and reports," I can manage.  I have to."  Based on his need he will require HHPT and [place] aide at home.  Will plan for [facility] training next session.   Will inform supervising PTof need for additional equipment based on his decision to return home.      Follow Up Recommendations  SNF;Supervision for mobility/OOB(Pt adamantly refusing SNF but will require HHPT to return home.[place] aide would be beneficial as he plans to return home alone.)     Equipment Recommendations  Wheelchair (measurements PT);3in1 (PT);Hospital bed;Wheelchair cushion (measurements PT)(long length slide board, drop arm commode,)    Recommendations for Other Services       Precautions / Restrictions Precautions Precautions: Fall Restrictions Weight Bearing Restrictions: Yes RLE Weight Bearing: Non weight bearing    Mobility  Bed Mobility Overal bed mobility: Modified Independent Bed Mobility: Supine to Sit           General bed mobility comments: Increased time and effort but required heavy use of bed railing.  Transfers Overall transfer level: Needs assistance Equipment used:  None;Sliding board Transfers: Lateral/Scoot Transfers;Squat Pivot Transfers     Squat pivot transfers: Mod assist;+2 physical assistance    Lateral/Scoot Transfers: Min assist;+2 safety/equipment;From elevated surface;With slide board(and from low to high surface.) General transfer comment: Had to have therapist foot under pts R foot to prevent WB RLE. Required mod A to power up during pivot in squat position.  Min +2 for safety with use of slide board.  Ambulation/Gait Ambulation/Gait assistance: (NT)               Stairs             Wheelchair Mobility    Modified [place] (Stroke Patients Only)       Balance Overall balance assessment: Needs assistance;History of Falls Sitting-balance support: No upper extremity supported;Feet supported Sitting balance-Leahy Scale: Good       Standing balance-Leahy Scale: Poor Standing balance comment: Unable to achieve standing without significant assistance.                            Cognition Arousal/Alertness: Awake/alert Behavior During Therapy: WFL for tasks assessed/performed;Impulsive Overall Cognitive Status: No family/caregiver present to determine baseline cognitive functioning Area of Impairment: Safety/judgement                         Safety/Judgement: Decreased awareness of safety;Decreased awareness of deficits     General Comments: Pt demonstrated poor safety and impulsivity during session. Feel he is close to baseline levels.  He remains unable to maintain weight  bearing despite cues.      Exercises      General Comments        Pertinent Vitals/Pain Pain Assessment: 0-10 Pain Score: 8  Pain Location: R lower leg- wound vac site Pain Descriptors / Indicators: Aching;Discomfort;Grimacing Pain Intervention(s): Monitored during session;Limited activity within patient's tolerance    Home Living                      Prior Function            PT Goals (current  goals can now be found in the care plan section) Acute Rehab PT Goals Patient Stated Goal: go home Potential to Achieve Goals: Fair Progress towards PT goals: Progressing toward goals    Frequency    Min 4X/week      PT Plan Current plan remains appropriate    Co-evaluation PT/OT/SLP Co-Evaluation/Treatment: Yes Reason for Co-Treatment: Complexity of the patient's impairments (multi-system involvement);For patient/therapist safety;Necessary to address cognition/behavior during functional activity;To address functional/ADL transfers PT goals addressed during session: Mobility/safety with mobility OT goals addressed during session: ADL's and self-care      AM-PAC PT "6 Clicks" Mobility   Outcome Measure  Help needed turning from your back to your side while in a flat bed without using bedrails?: None Help needed moving from lying on your back to sitting on the side of a flat bed without using bedrails?: A Little Help needed moving to and from a bed to a chair (including a wheelchair)?: A Little Help needed standing up from a chair using your arms (e.g., wheelchair or bedside chair)?: Total Help needed to walk in hospital room?: Total Help needed climbing 3-5 steps with a railing? : Total 6 Click Score: 13    End of Session Equipment Utilized During Treatment: Gait belt Activity Tolerance: Patient tolerated treatment well Patient left: in chair;with call bell/phone within reach;with chair alarm set(left sitting on comode as he requested increased time to sit and have a BM.) Nurse Communication: Mobility status PT Visit Diagnosis: Unsteadiness on feet (R26.81);Repeated falls (R29.6);Muscle weakness (generalized) (M62.81);History of falling (Z91.81);Difficulty in walking, not elsewhere classified (R26.2);Pain Pain - Right/Left: Right Pain - part of body: Ankle and joints of foot     Time: 1511-1538 PT Time Calculation (min) (ACUTE ONLY): 27 min  Charges:  $Therapeutic  Activity: 8-22 mins                     Governor Rooks, PTA Acute Rehabilitation Services Pager [phone] Office [phone]     Steven [name] Steven [name] 06/23/2019, 3:55 PM
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
## 2019-06-23 NOTE — Progress Notes (Signed)
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 Codeine Nausea And Vomiting    Patient Measurements: Height: 5\' 11"  (180.3 cm) Weight: 235 lb 7.2 oz (106.8 kg) IBW/kg (Calculated) : 75.3  Vital Signs: Temp: 98.3 F (36.8 C) (09/14 2016) Temp Source: Oral (09/14 2016) BP: 116/53 (09/14 2016) Pulse Rate: 77 (09/14 2016)  Labs: Recent Labs    06/21/19 0322 06/22/19 0802 06/22/19 1826 06/23/19 0051  HGB  --  8.3* 9.3*  --   HCT  --  27.4* 29.8*  --   PLT  --  159 164  --   LABPROT 25.6*  --  20.8* 22.5*  INR 2.4*  --  1.8* 2.0*  CREATININE  --  4.70* 2.55*  --     Estimated Creatinine Clearance: 39.7 mL/min (A) (by C-G formula based on SCr of 2.55 mg/dL (H)).  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 PTA Coumadin: 7.5 mg TTSS, none on dialysis days.  He reports some recent bleeding when coming off HD. Last PTA dose of Coumadin on 9/7. INR 8.4 on admit 9/8 with right ankle fracture.  Vitamin K 2.5 mg PO given on 9/8 and INR down but remained supratherapeutic. FFP given in the morning prior to trip to OR. Patient's INR yesterday was 1.8 (drawn late).   9/15: INR is 2 today. We will continue Coumadin on home dose regimen.   Goal of Therapy:  INR 2-3 Monitor platelets by anticoagulation protocol: Yes   Plan:  Warfarin 7.5mg  PO x 1  Daily INR / CBC   Archana Eckman A. Levada Dy, PharmD, BCPS, FNKF Clinical Pharmacist [place] Please utilize Amion for appropriate phone number to reach the unit pharmacist ([place])

## 2019-06-23 NOTE — Care Management (Signed)
    Durable Medical Equipment  (From admission, onward)         Start     Ordered   06/23/19 1606  For home use only DME Hospital bed  Once    Comments: Patient wants it delivered after he is discharged.  Question Answer Comment  Length of Need 6 Months   Patient has (list medical condition): Ankle fracture   The above medical condition requires: Patient requires the ability to reposition frequently   Head must be elevated greater than: 45 degrees   Bed type Semi-electric   Support Surface: Gel Overlay      06/23/19 1605   06/23/19 1604  For home use only DME lightweight manual wheelchair with seat cushion  Once    Comments: Patient suffers from  Ankle fracture which impairs their ability to perform daily activities like ambulating  in the home.  A cane  will not resolve  issue with performing activities of daily living. A wheelchair will allow patient to safely perform daily activities. Patient is not able to propel themselves in the home using a standard weight wheelchair due to weakness. Patient can self propel in the lightweight wheelchair. Length of need 6 months Accessories: elevating leg rests (ELRs), wheel locks, extensions and anti-tippers.   Seat and back cushion  Elevating leg rests   To be delivered to patient's hospital room   06/23/19 1604

## 2019-06-23 NOTE — Progress Notes (Signed)
Occupational Therapy Treatment Patient Details Name: Steven [name] MRN: [id number] DOB: [birth year] Today's Date: 06/23/2019    History of present illness Steven [name] is a 58 y.o. male with medical history significant of history of R CVA, pulmonary embolism, alcohol abuse, ambulatory dysfunction, hypertension, COPD, type 2 diabetes mellitus and end-stage renal disease on hemodialysis. Pt fell with RW and suffered R tibial pilon fx with malunion, underwent ORIF by Dr Marcelino Scot 06/19/19   OT comments  Pt seen with PT for functional mobility progression and ADLs. Pt unable to maintain NWB status; session focus on lateral transfers using transfer boards. MIN +2 for lateal transfer with transfer board. MAX verbal cues to not bear weight through RLE despite therapist foot being underneath pts as tactile cue- pt still unable to maintain. Practiced transfer from high<>Low surface and from recliner>BSC. Pt required MOD +2 for recliner>BSC transfer.  Educated pt on tub bench transfer from w/c. Pt verbalized understanding. Pt declining SNF placement despite education on benefits of skilled therapy to maximize functional independence to return to PLOF. Will continue to follow for acute OT needs.   Follow Up Recommendations  Supervision/Assistance - 24 hour;SNF    Equipment Recommendations  3 in 1 bedside commode;Tub/shower bench;Other (comment)(drop arm BSC)    Recommendations for Other Services      Precautions / Restrictions Precautions Precautions: Fall Restrictions Weight Bearing Restrictions: Yes RLE Weight Bearing: Non weight bearing Other Position/Activity Restrictions: wound vac       Mobility Bed Mobility Overal bed mobility: Modified Independent Bed Mobility: Supine to Sit           General bed mobility comments: Increased time and effort but required heavy use of bed railing.  Transfers Overall transfer level: Needs assistance Equipment used: None;Sliding board Transfers:  Lateral/Scoot Transfers;Squat Pivot Transfers     Squat pivot transfers: Mod assist;+2 physical assistance    Lateral/Scoot Transfers: Min assist;+2 safety/equipment;From elevated surface;With slide board(high<>low) General transfer comment: Had to have therapist foot under pts R foot to prevent WB RLE. Required mod A to power up during pivot in squat position.  Min +2 for safety with use of slide board.    Balance Overall balance assessment: Needs assistance;History of Falls Sitting-balance support: No upper extremity supported;Feet supported Sitting balance-Leahy Scale: Good       Standing balance-Leahy Scale: Poor Standing balance comment: Unable to achieve standing without significant assistance.                           ADL either performed or assessed with clinical judgement   ADL Overall ADL's : Needs assistance/impaired     Grooming: Oral care;Sitting;Set up Grooming Details (indicate cue type and reason): set- up for oral care from recliner             Lower Body Dressing: Modified independent;Sit to/from stand;Sitting/lateral leans Lower Body Dressing Details (indicate cue type and reason): able to reach feet from sitting; education on LB dressign using lateral leans from chair d/t NWB status;verbalized undetstanding Toilet Transfer: +2 for safety/equipment;Cueing for safety;Requires drop arm;Moderate assistance;BSC Toilet Transfer Details (indicate cue type and reason): Required min A +2 to manage wound vac, safety, and maintain NWB status.  Toileting- Clothing Manipulation and Hygiene: Moderate assistance;Sitting/lateral lean     Tub/Shower Transfer Details (indicate cue type and reason): discussed tub bench transfer; pt verbalized understanding Functional mobility during ADLs: +2 for safety/equipment;Cueing for safety;Moderate assistance;Minimal assistance(transfer board) General ADL Comments: Pt required  cues throughout for safety and to maintain NWB  status     Vision Baseline Vision/History: Wears glasses Patient Visual Report: No change from baseline Vision Assessment?: No apparent visual deficits   Perception     Praxis      Cognition Arousal/Alertness: Awake/alert Behavior During Therapy: WFL for tasks assessed/performed;Impulsive Overall Cognitive Status: No family/caregiver present to determine baseline cognitive functioning Area of Impairment: Safety/judgement                         Safety/Judgement: Decreased awareness of safety;Decreased awareness of deficits     General Comments: Pt demonstrated poor safety and impulsivity during session. Feel he is close to baseline levels.  He remains unable to maintain weight bearing despite cues.        Exercises     Shoulder Instructions       General Comments      Pertinent Vitals/ Pain       Pain Assessment: 0-10 Pain Score: 8  Pain Location: R lower leg- wound vac site Pain Descriptors / Indicators: Aching;Discomfort;Grimacing Pain Intervention(s): Monitored during session;Limited activity within patient's tolerance  Home Living                                          Prior Functioning/Environment              Frequency  Min 2X/week        Progress Toward Goals  OT Goals(current goals can now be found in the care plan section)  Progress towards OT goals: Progressing toward goals  Acute Rehab OT Goals Patient Stated Goal: go home Time For Goal Achievement: 07/05/19 Potential to Achieve Goals: Fair  Plan      Co-evaluation    PT/OT/SLP Co-Evaluation/Treatment: Yes Reason for Co-Treatment: Complexity of the patient's impairments (multi-system involvement);For patient/therapist safety;Necessary to address cognition/behavior during functional activity;To address functional/ADL transfers PT goals addressed during session: Mobility/safety with mobility OT goals addressed during session: ADL's and self-care;Proper  use of Adaptive equipment and DME      AM-PAC OT "6 Clicks" Daily Activity     Outcome Measure   Help from another person eating meals?: None Help from another person taking care of personal grooming?: A Little Help from another person toileting, which includes using toliet, bedpan, or urinal?: A Lot Help from another person bathing (including washing, rinsing, drying)?: A Lot Help from another person to put on and taking off regular upper body clothing?: A Little Help from another person to put on and taking off regular lower body clothing?: A Lot 6 Click Score: 16    End of Session Equipment Utilized During Treatment: Other (comment)(transfer board)  OT Visit Diagnosis: History of falling (Z91.81);Pain   Activity Tolerance Patient tolerated treatment well   Patient Left (on BSC finishing BM)   Nurse Communication          [id number] OT Time Calculation (min): 24 min  Charges: OT General Charges $OT Visit: 1 Visit OT Treatments $Self Care/Home Management : 8-22 mins  [place], [place] [phone] [place] 06/23/2019, 4:08 PM

## 2019-06-23 NOTE — Progress Notes (Signed)
Subjective:  Seen in room, no new c/o's.   Objective Vital signs in last 24 hours: Vitals:   06/22/19 1100 06/22/19 1117 06/22/19 1346 06/22/19 2016  BP: (!) 114/57 (!) 117/52 (!) 147/75 (!) 116/53  Pulse: 72 72 80 77  Resp:  16 14 17   Temp:  98.1 F (36.7 C) 98.5 F (36.9 C) 98.3 F (36.8 C)  TempSrc:  Oral Oral Oral  SpO2:  96% 90% 95%  Weight:  106.8 kg    Height:       Weight change:   Physical Exam: Gen: alert male , NAD on HD  Card : regular rhythm and normal rate, no rub  Resp:clear to auscultation bilaterally, no rales/rhonchi [id number], obese, NT [id number] leg in clean/dry cast and dressing with wound VAC in situ. / Left no pedal edema  Hemodialysis Access =Right BCF patent on HD   Dialysis: MWF [facility]   4h  2/2.5 bath  103kg   R BCF   Heparin 3000   Max UF 1.5L/hr  400/800 Epogen 6000u tiw Hectorol 30mcg tiw  Problem/Plan:  1. Right ankle fracture:  underwent 06/20/19  ORIF of right distal tibia with right tibial/fibular arthrodesis and application of wound VAC.  Additional management plans per orthopedic surgery. 2. End-stage renal disease on hemodialysis: HD MWF at OP unit. Plan HD tomorrow. Discontinued renal diet, continue carb modified with fluid restriction. 3. Hypertension /Vol :  Uf gl 2.5 today  And blood pressure under fair control, monitor postoperatively for titration of antihypertensive therapy/adjustment of ultrafiltration. 4. Anemia of chronic kidney disease:HGB  8.3  Likely compounded by recent osseous injury and associated surgical loss, will adjust ESA dosing and monitor hemoglobin  trend. 5. Secondary hyperparathyroidism: Calcium level within acceptable range, restart VDRA.Phos 2.0, =hold phosl  Binders   Rob Jonnie Finner  MD 06/23/2019, 12:57 PM    Labs: Basic Metabolic Panel: Recent Labs  Lab 06/17/19 0242 06/19/19 1503 06/22/19 0802 06/22/19 1826  NA 132* 136 133* 134*  K 4.2 3.8 3.9 3.7  CL 99  --  96* 96*  CO2 22  --   27 26  GLUCOSE 106* 129* 222* 243*  BUN 23*  --  29* 17  CREATININE 4.82*  --  4.70* 2.55*  CALCIUM 8.3*  --  8.1* 8.3*  PHOS  --   --  2.0*  --    Liver Function Tests: Recent Labs  Lab 06/16/19 1453 06/22/19 0802  AST 20  --   ALT 17  --   ALKPHOS 126  --   BILITOT 0.9  --   PROT 6.2*  --   ALBUMIN 2.9* 2.0*   No results for input(s): LIPASE, AMYLASE in the last 168 hours. No results for input(s): AMMONIA in the last 168 hours. CBC: Recent Labs  Lab 06/16/19 1453  06/18/19 0121 06/19/19 0554  06/20/19 0331 06/22/19 0802 06/22/19 1826  WBC 8.6   < > 5.7 5.5  --  5.6 4.1 5.3  NEUTROABS 7.3  --   --   --   --   --   --   --   HGB 11.8*   < > 9.7* 10.6*   < > 9.0* 8.3* 9.3*  HCT 37.9*   < > 30.6* 33.6*   < > 29.1* 27.4* 29.8*  MCV 86.5   < > 86.4 86.4  --  87.4 89.8 88.4  PLT 152   < > 143* 143*  --  133* 159 164   < > =  values in this interval not displayed.   Cardiac Enzymes: No results for input(s): CKTOTAL, CKMB, CKMBINDEX, TROPONINI in the last 168 hours. CBG: Recent Labs  Lab 06/22/19 1336 06/22/19 1731 06/22/19 2105 06/23/19 0845 06/23/19 1150  GLUCAP 249* 243* 210* 252* 201*    Studies/Results: No results found. Medications:  . sodium chloride   Intravenous Once  . calcium acetate  2,001 mg Oral TID WC  . Chlorhexidine Gluconate Cloth  6 each Topical Q0600  . [START ON 06/24/2019] darbepoetin (ARANESP) injection - DIALYSIS  100 mcg Intravenous Q Wed-HD  . [START ON 06/24/2019] doxercalciferol  1 mcg Intravenous Q M,W,F-HD  . feeding supplement (PRO-STAT SUGAR FREE 64)  30 mL Oral BID  . furosemide  80 mg Oral Daily  . gabapentin  300 mg Oral QHS  . insulin aspart  0-9 Units Subcutaneous TID WC  . metoprolol tartrate  12.5 mg Oral BID  . mirtazapine  15 mg Oral QHS  . multivitamin  1 tablet Oral QHS  . mupirocin ointment  1 application Nasal BID  . pantoprazole  40 mg Oral Daily  . polyethylene glycol  17 g Oral Daily  . senna-docusate  1  tablet Oral BID  . sodium bicarbonate  650 mg Oral BID  . spironolactone  25 mg Oral Daily  . warfarin  7.5 mg Oral ONCE-1800  . Warfarin - Pharmacist Dosing Inpatient   Does not apply [phone]

## 2019-06-23 NOTE — Progress Notes (Signed)
Inpatient Diabetes Program Recommendations  AACE/ADA: New Consensus Statement on Inpatient Glycemic Control (2015)  Target Ranges:  Prepandial:   less than 140 mg/dL      Peak postprandial:   less than 180 mg/dL (1-2 hours)      Critically ill patients:  140 - 180 mg/dL   Lab Results  Component Value Date   GLUCAP 201 (H) 06/23/2019   HGBA1C 6.1 (H) 06/19/2019    Review of Glycemic Control Results for Steven [name], Steven [name] (MRN [id number]) as of 06/23/2019 12:48  Ref. Range 06/22/2019 13:36 06/22/2019 17:31 06/22/2019 21:05 06/23/2019 08:45 06/23/2019 11:50  Glucose-Capillary Latest Ref Range: 70 - 99 mg/dL 249 (H) 243 (H) 210 (H) 252 (H) 201 (H)   Diabetes history: DM2 Outpatient Diabetes medications: Levemir 9 + Novolog 3-15 units tid meals Current orders for Inpatient glycemic control: Novolog sensitive correction tid  Inpatient Diabetes Program Recommendations:   CBGs consistently >200 -Add Levemir 6 units daily -Novolog 3 units tid meal coverage if eats 50%  Thank you, Bethena Roys E. Bronda Alfred, RN, MSN, CDE  Diabetes Coordinator Inpatient Glycemic Control Team Team Pager [phone] (8am-5pm) 06/23/2019 12:50 PM

## 2019-06-23 NOTE — Progress Notes (Signed)
Orthopedic Trauma Service Progress Note  Patient ID: Steven [name] MRN: [id number] DOB/AGE: 1961/05/26 58 y.o.  Subjective:  Pain controlled  No complaints Sleeping when I entered room   ROS As above  Objective:   VITALS:   Vitals:   06/22/19 1100 06/22/19 1117 06/22/19 1346 06/22/19 2016  BP: (!) 114/57 (!) 117/52 (!) 147/75 (!) 116/53  Pulse: 72 72 80 77  Resp:  16 14 17   Temp:  98.1 F (36.7 C) 98.5 F (36.9 C) 98.3 F (36.8 C)  TempSrc:  Oral Oral Oral  SpO2:  96% 90% 95%  Weight:  106.8 kg    Height:        Estimated body mass index is 32.84 kg/m as calculated from the following:   Height as of this encounter: 5\' 11"  (1.803 m).   Weight as of this encounter: 106.8 kg.   Intake/Output      09/14 0701 - 09/15 0700 09/15 0701 - 09/16 0700   P.O. 240    Total Intake(mL/kg) 240 (2.2)    Urine (mL/kg/hr) 300 (0.1) 300 (0.4)   Other 2500    Total Output 2800 300   Net -2560 -300        Urine Occurrence 1 x      LABS  Results for orders placed or performed during the hospital encounter of 06/16/19 (from the past 24 hour(s))  Glucose, capillary     Status: Abnormal   Collection Time: 06/22/19  5:31 PM  Result Value Ref Range   Glucose-Capillary 243 (H) 70 - 99 mg/dL  Basic metabolic panel     Status: Abnormal   Collection Time: 06/22/19  6:26 PM  Result Value Ref Range   Sodium 134 (L) 135 - 145 mmol/L   Potassium 3.7 3.5 - 5.1 mmol/L   Chloride 96 (L) 98 - 111 mmol/L   CO2 26 22 - 32 mmol/L   Glucose, Bld 243 (H) 70 - 99 mg/dL   BUN 17 6 - 20 mg/dL   Creatinine, Ser 2.55 (H) 0.61 - 1.24 mg/dL   Calcium 8.3 (L) 8.9 - 10.3 mg/dL   GFR calc non Af Amer 27 (L) >60 mL/min   GFR calc Af Amer 31 (L) >60 mL/min   Anion gap 12 5 - 15  Protime-INR     Status: Abnormal   Collection Time: 06/22/19  6:26 PM  Result Value Ref Range   Prothrombin Time 20.8 (H) 11.4 - 15.2 seconds   INR 1.8 (H) 0.8 - 1.2  CBC     Status: Abnormal   Collection Time: 06/22/19  6:26 PM  Result Value Ref Range   WBC 5.3 4.0 - 10.5 K/uL   RBC 3.37 (L) 4.22 - 5.81 MIL/uL   Hemoglobin 9.3 (L) 13.0 - 17.0 g/dL   HCT 29.8 (L) 39.0 - 52.0 %   MCV 88.4 80.0 - 100.0 fL   MCH 27.6 26.0 - 34.0 pg   MCHC 31.2 30.0 - 36.0 g/dL   RDW 19.3 (H) 11.5 - 15.5 %   Platelets 164 150 - 400 K/uL   nRBC 0.0 0.0 - 0.2 %  Glucose, capillary     Status: Abnormal   Collection Time: 06/22/19  9:05 PM  Result Value Ref Range   Glucose-Capillary 210 (H) 70 - 99 mg/dL  Protime-INR     Status: Abnormal   Collection Time: 06/23/19 12:51 AM  Result Value Ref Range   Prothrombin Time 22.5 (H) 11.4 - 15.2 seconds   INR 2.0 (H) 0.8 - 1.2  Glucose, capillary     Status: Abnormal   Collection Time: 06/23/19  8:45 AM  Result Value Ref Range   Glucose-Capillary 252 (H) 70 - 99 mg/dL  Glucose, capillary     Status: Abnormal   Collection Time: 06/23/19 11:50 AM  Result Value Ref Range   Glucose-Capillary 201 (H) 70 - 99 mg/dL     PHYSICAL EXAM:   Gen: sleeping, comfortable appearing. Aroused easily  Ext:       Right Lower Extremity   Splint fitting well  prevena with good seal and functioning well  No drainage in vac canister  EHL, FHL, lesser toe motor intact  Sensation intact  Foot warm   Assessment/Plan: 4 Days Post-Op   Principal Problem:   Ankle fracture Active Problems:   Diabetes mellitus type 2 in obese (HCC)   Hyperlipidemia   Morbid obesity (HCC)   Essential hypertension   End stage renal disease (HCC)   COPD, mild (HCC)   Anti-infectives (From admission, onward)   Start     Dose/Rate Route Frequency Ordered Stop   06/20/19 1530  ceFAZolin (ANCEF) IVPB 1 g/50 mL premix  Status:  Discontinued     1 g 100 mL/hr over 30 Minutes Intravenous Every 24 hours 06/19/19 1857 06/20/19 1501   06/19/19 0800  ceFAZolin (ANCEF) IVPB 1 g/50 mL premix     1 g 100 mL/hr over 30 Minutes Intravenous  To [facility] 06/18/19 2202 06/19/19 1524    .  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, and syndesmotic fusion   NWB R leg x 8 week  Splint x 10 more days then begin gentle ROM   Will remove prevena dressing at time of splint removal  Ice and elevate  Toe motion as tolerated  PT/OT     - Pain management:             Continue with current regimen    - ABL anemia/Hemodynamics             Stable   - Medical issues              per IM and renal    - DVT/PE prophylaxis:             coumadin chronically   - ID:              periop abx completed    - Metabolic Bone Disease:            vitamin d insufficiency   Chronic renal disease   Diabetes   - FEN/GI prophylaxis/Foley/Lines:             carb mod diet    - Impediments to fracture healing:             CKD on dialysis             etoh use             Diabetes              Chronic meds (lasix)             Poor  balance             Snuff user    - Dispo:             continue with therapies   SNF    Jari Pigg, PA-C [phone] (C) 06/23/2019, 1:58 PM  [facility] [place] [id number] [phone] (F)
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
## 2019-06-23 NOTE — TOC Progression Note (Signed)
Transition of Care [facility]) - Progression Note    Patient Details  Name: Steven [name] MRN: [id number] Date of Birth: 23-Feb-1961  Transition of Care [facility]) CM/SW [place], [place] Phone Number: 06/23/2019, 1:50 PM  Clinical Narrative:    PCP appointment was arranged for 9/29 at 2:20pm, added to AVS.    Expected Discharge Plan: [place] Barriers to Discharge: Continued Medical Work up  Expected Discharge Plan and Services Expected Discharge Plan: [place] In-house Referral: Clinical Social Work Discharge Planning Services: CM Consult, Follow-up appt scheduled, Other - See comment(TOC for meds) Post Acute Care Choice: Durable Medical Equipment, Home Health Living arrangements for the past 2 months: [place]                 DME Arranged: Wheelchair manual, Hospital bed, 3-N-1 DME Agency: [facility]       HH Arranged: PT, OT [facility] Agency: [place] Date [place]: 06/23/19 Time [place]: 61 Representative spoke with at [place]: [place] (SDOH) Interventions    Readmission Risk Interventions Readmission Risk Prevention Plan 06/23/2019 02/13/2019  Transportation Screening Complete Complete  Medication Review Press photographer) Complete Complete  PCP or Specialist appointment within 3-5 days of discharge Complete Complete  HRI or [place] Complete Complete  SW Recovery Care/Counseling Consult Complete Complete  Palliative Care Screening Not Applicable Not [place] Patient Refused Complete  Some recent data might be hidden

## 2019-06-23 NOTE — Progress Notes (Addendum)
Progress note    Steven [name] [id number] DOB: 01-14-1961 DOA: 06/16/2019  PCP: Maryruth Hancock, MD   Patient coming from: Home   Chief Complaint: Right ankle pain.   HPI: Steven [name] is a 58 y.o. male with medical history significant of history of pulmonary embolism, alcohol abuse, ambulatory dysfunction, hypertension, COPD, type 2 diabetes mellitus and end-stage renal disease on hemodialysis. Patient uses a walker for ambulation.  Yesterday while trying to get bathroom he lost his balance while holding his walker and twisted his right ankle.  No head trauma no loss of consciousness.  Post trauma he had significant pain on his right ankle, sharp in nature, 10 out of 10 in intensity, worse with weightbearing, associated with local edema, no fevers, no chills.  No improving factors.  Yesterday he missed his hemodialysis session due to right ankle pain. Today he presented to the hospital due to persistent symptoms. In ED patient was diagnosed with right ankle fracture, orthopedics was consulted, recommendations for splinting, remain nonweightbearing to the right lower extremity and transferred to [facility], for further surgical evaluation.  Subjective: No acute issues or events overnight, pain currently well controlled.  Declines chest pain, shortness of breath, nausea, vomiting, diarrhea, constipation, headache, fever, chills.  Assessment/Plan Principal Problem:   Ankle fracture Active Problems:   Diabetes mellitus type 2 in obese (HCC)   Hyperlipidemia   Morbid obesity (HCC)   Essential hypertension   End stage renal disease (HCC)   COPD, mild (HCC)   Acute right ankle fracture after mechanical fall, POA   -Pain well controlled with current regimen, status post ORIF on 06/19/2019 -wound VAC intact, without leak -Defer to orthopedic surgery for further evaluation treatment, DVT, pain management -PT OT to follow, likely discharge in the next 24 to 48 hours pending clinical course,  PT recommendations -Wound VAC available 06/23/2019 per orthopedics  Supratherapeutic INR, history of pulmonary embolism on warfarin, resolving.  -INR elevated 8.4 at intake -INR currently 2.0 -Orthopedics okay with resuming home Coumadin dose operatively -defer to pharmacy for dosing -Patient likely needs further education about diet given warfarin, patient requesting spinach and leafy greens with meals, will have dietary/nutrition follow-up before discharge  End-stage renal disease on hemodialysis MWF, stable.  -Nephrology following, appreciate insight and recommendations -Appears euvolemic, follow labs with dialysis  -Continue furosemide and spironolactone.   -Continue serum bicarbonate and PhosLo -now off renal diet  Hypertension, essential, well-controlled.   - Continue home medications including metoprolol, furosemide and spironolactone. - Borderline hypotension this afternoon, follow clinically, remains asymptomatic, non-tachycardic, likely in the setting of narcotics postsurgically  Insulin-dependent type 2 diabetes mellitus.   -Continue sliding scale insulin, hypoglycemic protocol  -Resume basal insulin at 10 units every morning -titrate as necessary  COPD, not in acute exacerbation.   -Continue home nebs, currently oxygenating well on room air  Depression.  -Continue mirtazapine.  DVT prophylaxis: warfarin -currently on hold due to supratherapeutic INR as above and need for surgery -resume once cleared by surgery Code Status: full  Family Communication: no family at the bedside   Disposition Plan: Disposition pending PT conditions, home health versus SNF  Consults called: Orthopedics  Admission status: Inpatient.  Continues to require postsurgical care, ongoing dialysis, and physical therapy given poor ambulation given nonweightbearing status per orthopedic surgery.  Physical Exam: Vitals:   06/22/19 1100 06/22/19 1117 06/22/19 1346 06/22/19 2016  BP: (!) 114/57 (!)  117/52 (!) 147/75 (!) 116/53  Pulse: 72 72 80 77  Resp:  16 14 17   Temp:  98.1 F (36.7 C) 98.5 F (36.9 C) 98.3 F (36.8 C)  TempSrc:  Oral Oral Oral  SpO2:  96% 90% 95%  Weight:  106.8 kg    Height:        Vitals:   06/22/19 1100 06/22/19 1117 06/22/19 1346 06/22/19 2016  BP: (!) 114/57 (!) 117/52 (!) 147/75 (!) 116/53  Pulse: 72 72 80 77  Resp:  16 14 17   Temp:  98.1 F (36.7 C) 98.5 F (36.9 C) 98.3 F (36.8 C)  TempSrc:  Oral Oral Oral  SpO2:  96% 90% 95%  Weight:  106.8 kg    Height:       General:  Pleasantly resting in bed, No acute distress. HEENT:  Normocephalic atraumatic.  Sclerae nonicteric, noninjected.  Extraocular movements intact bilaterally. Neck:  Without mass or deformity.  Trachea is midline. Lungs:  Clear to auscultate bilaterally without rhonchi, wheeze, or rales. Heart:  Regular rate and rhythm.  Without murmurs, rubs, or gallops. Abdomen:  Soft, nontender, nondistended.  Without guarding or rebound. Extremities: Right foot bandage clean dry intact -wound VAC ongoing without overt leak. Vascular:  Dorsalis pedis and posterior tibial pulses palpable bilaterally. Skin:  Warm and dry, no erythema, no ulcerations.   Labs on Admission: I have personally reviewed following labs and imaging studies  CBC: Recent Labs  Lab 06/16/19 1453  06/18/19 0121 06/19/19 0554 06/19/19 1503 06/20/19 0331 06/22/19 0802 06/22/19 1826  WBC 8.6   < > 5.7 5.5  --  5.6 4.1 5.3  NEUTROABS 7.3  --   --   --   --   --   --   --   HGB 11.8*   < > 9.7* 10.6* 9.9* 9.0* 8.3* 9.3*  HCT 37.9*   < > 30.6* 33.6* 29.0* 29.1* 27.4* 29.8*  MCV 86.5   < > 86.4 86.4  --  87.4 89.8 88.4  PLT 152   < > 143* 143*  --  133* 159 164   < > = values in this interval not displayed.   Basic Metabolic Panel: Recent Labs  Lab 06/16/19 1453 06/17/19 0242 06/19/19 1503 06/22/19 0802 06/22/19 1826  NA 132* 132* 136 133* 134*  K 4.9 4.2 3.8 3.9 3.7  CL 97* 99  --  96* 96*  CO2 26  22  --  27 26  GLUCOSE 81 106* 129* 222* 243*  BUN 23* 23*  --  29* 17  CREATININE 4.38* 4.82*  --  4.70* 2.55*  CALCIUM 8.7* 8.3*  --  8.1* 8.3*  PHOS  --   --   --  2.0*  --     Liver Function Tests: Recent Labs  Lab 06/16/19 1453 06/22/19 0802  AST 20  --   ALT 17  --   ALKPHOS 126  --   BILITOT 0.9  --   PROT 6.2*  --   ALBUMIN 2.9* 2.0*   Coagulation Profile: Recent Labs  Lab 06/19/19 1316 06/20/19 0331 06/21/19 0322 06/22/19 1826 06/23/19 0051  INR 3.2* 3.3* 2.4* 1.8* 2.0*   CBG: Recent Labs  Lab 06/21/19 2022 06/22/19 0648 06/22/19 1336 06/22/19 1731 06/22/19 2105  GLUCAP 257* 224* 249* 243* 210*   Urine analysis:    Component Value Date/Time   COLORURINE RED (A) 02/11/2019 1336   APPEARANCEUR HAZY (A) 02/11/2019 1336   LABSPEC 1.010 02/11/2019 1336   PHURINE 7.0 02/11/2019 1336   GLUCOSEU 250 (A)  02/11/2019 1336   HGBUR LARGE (A) 02/11/2019 1336   BILIRUBINUR NEGATIVE 02/11/2019 1336   KETONESUR TRACE (A) 02/11/2019 1336   PROTEINUR >300 (A) 02/11/2019 1336   NITRITE POSITIVE (A) 02/11/2019 1336   LEUKOCYTESUR SMALL (A) 02/11/2019 1336    Radiological Exams on Admission: No results found.   Time spent: 25 minutes  Little Ishikawa DO [facility]  06/23/2019, 8:04 AM

## 2019-06-23 NOTE — TOC Progression Note (Addendum)
Transition of Care [facility]) - Progression Note    Patient Details  Name: Steven [name] MRN: [id number] Date of Birth: 17-Jul-1961  Transition of Care [facility]) CM/SW Contact  Jacalyn Lefevre Edson Snowball, RN Phone Number: 06/23/2019, 4:11 PM  Clinical Narrative:     PT recommending SNF. Patient aware and voiced understanding. Patient declining SNF at this time. He prefers to go home at discharge. He is "working on " getting someone to stay with him 24/7.    Since patient declined SNF, PT recommended HHPT/OT, wheel chair with elevating leg rests, drop arm commode, long slide board, hospital bed and tub bench.   Patient has a friend who will pick him up and take him home at discharge. PT recommending wheel chair and sliding board be delivered to patient's hospital room prior to discharge. Patient wants other DME delivered to his home after he is discharge. Orders entered , called Zack with [employer]. Expected discharge date is 06/24/19 , patient aware    Patient now may consider SNF . [place] working on same. I will order DME in case he decides to go home. Patient aware discharge is tomorrow.    prevena dressing VAC   Expected Discharge Plan: [place] Barriers to Discharge: Continued Medical Work up  Expected Discharge Plan and Services Expected Discharge Plan: [place] In-house Referral: Clinical Social Work Discharge Planning Services: CM Consult, Follow-up appt scheduled, Other - See comment(TOC for meds) Post Acute Care Choice: Durable Medical Equipment, Home Health Living arrangements for the past 2 months: [place]                 DME Arranged: Wheelchair manual, Hospital bed, 3-N-1 DME Agency: [employer]       HH Arranged: PT, OT [facility] Agency: [place] Date [place]: 06/23/19 Time [place]: 28 Representative spoke with at [place]: [place] (SDOH) Interventions    Readmission Risk Interventions Readmission Risk Prevention Plan 06/23/2019 02/13/2019  Transportation Screening Complete Complete  Medication Review Press photographer) Complete Complete  PCP or Specialist appointment within 3-5 days of discharge Complete Complete  HRI or [place] Complete Complete  SW Recovery Care/Counseling Consult Complete Complete  Palliative Care Screening Not Applicable Not [place] Patient Refused Complete  Some recent data might be hidden

## 2019-06-24 DIAGNOSIS — E785 Hyperlipidemia, unspecified: Secondary | ICD-10-CM

## 2019-06-24 LAB — GLUCOSE, CAPILLARY
Glucose-Capillary: 175 mg/dL — ABNORMAL HIGH (ref 70–99)
Glucose-Capillary: 158 mg/dL — ABNORMAL HIGH (ref 70–99)
Glucose-Capillary: 260 mg/dL — ABNORMAL HIGH (ref 70–99)
Glucose-Capillary: 121 mg/dL — ABNORMAL HIGH (ref 70–99)

## 2019-06-24 LAB — CBC
HCT: 27 % — ABNORMAL LOW (ref 39.0–52.0)
Hemoglobin: 8.6 g/dL — ABNORMAL LOW (ref 13.0–17.0)
MCH: 27.9 pg (ref 26.0–34.0)
MCHC: 31.9 g/dL (ref 30.0–36.0)
MCV: 87.7 fL (ref 80.0–100.0)
Platelets: 200 10*3/uL (ref 150–400)
RBC: 3.08 MIL/uL — ABNORMAL LOW (ref 4.22–5.81)
RDW: 19 % — ABNORMAL HIGH (ref 11.5–15.5)
WBC: 5.1 10*3/uL (ref 4.0–10.5)
nRBC: 0 % (ref 0.0–0.2)

## 2019-06-24 LAB — RENAL FUNCTION PANEL
Albumin: 2 g/dL — ABNORMAL LOW (ref 3.5–5.0)
Anion gap: 10 (ref 5–15)
BUN: 43 mg/dL — ABNORMAL HIGH (ref 6–20)
CO2: 26 mmol/L (ref 22–32)
Calcium: 8.6 mg/dL — ABNORMAL LOW (ref 8.9–10.3)
Chloride: 99 mmol/L (ref 98–111)
Creatinine, Ser: 4.63 mg/dL — ABNORMAL HIGH (ref 0.61–1.24)
GFR calc Af Amer: 15 mL/min — ABNORMAL LOW (ref >60)
GFR calc non Af Amer: 13 mL/min — ABNORMAL LOW (ref >60)
Glucose, Bld: 269 mg/dL — ABNORMAL HIGH (ref 70–99)
Phosphorus: 1 mg/dL — CL (ref 2.5–4.6)
Potassium: 4.1 mmol/L (ref 3.5–5.1)
Sodium: 135 mmol/L (ref 135–145)

## 2019-06-24 LAB — SARS CORONAVIRUS 2 (TAT 6-24 HRS): SARS Coronavirus 2: NEGATIVE

## 2019-06-24 LAB — PROTIME-INR
INR: 1.9 — ABNORMAL HIGH (ref 0.8–1.2)
Prothrombin Time: 21.6 seconds — ABNORMAL HIGH (ref 11.4–15.2)

## 2019-06-24 MED ORDER — WARFARIN SODIUM 5 MG PO TABS: 7.5000 mg | ORAL_TABLET | ORAL | 0 refills | Status: DC

## 2019-06-24 MED ORDER — DOXERCALCIFEROL 4 MCG/2ML IV SOLN
INTRAVENOUS | Status: AC
  Filled 2019-06-24: qty 2

## 2019-06-24 MED ORDER — DARBEPOETIN ALFA 100 MCG/0.5ML IJ SOSY
PREFILLED_SYRINGE | INTRAMUSCULAR | Status: AC
  Filled 2019-06-24: qty 0.5

## 2019-06-24 MED ORDER — SODIUM PHOSPHATES 45 MMOLE/15ML IV SOLN
20.0000 mmol | Freq: Once | INTRAVENOUS | Status: AC
  Administered 2019-06-24: 20 mmol via INTRAVENOUS
  Filled 2019-06-24 (×2): qty 6.67

## 2019-06-24 MED ORDER — OXYCODONE-ACETAMINOPHEN 5-325 MG PO TABS: 1.0000 | ORAL_TABLET | Freq: Three times a day (TID) | ORAL | 0 refills | Status: DC | PRN

## 2019-06-24 MED ORDER — INSULIN DETEMIR 100 UNIT/ML ~~LOC~~ SOLN: 10.0000 [IU] | Freq: Every day | SUBCUTANEOUS | 11 refills | Status: DC

## 2019-06-24 MED ORDER — ACETAMINOPHEN 325 MG PO TABS: 650.0000 mg | ORAL_TABLET | Freq: Two times a day (BID) | ORAL | 0 refills | Status: DC

## 2019-06-24 MED ORDER — CALCIUM ACETATE (PHOS BINDER) 667 MG PO CAPS: 667.0000 mg | ORAL_CAPSULE | Freq: Three times a day (TID) | ORAL | Status: DC

## 2019-06-24 MED ORDER — WARFARIN SODIUM 2 MG PO TABS
2.0000 mg | ORAL_TABLET | Freq: Once | ORAL | Status: DC
  Filled 2019-06-24: qty 1

## 2019-06-24 NOTE — Progress Notes (Signed)
Occupational Therapy Treatment Patient Details Name: Steven [name] MRN: [id number] DOB: July 11, 1961 Today's Date: 06/24/2019    History of present illness Steven [name] is a 58 y.o. male with medical history significant of history of R CVA, pulmonary embolism, alcohol abuse, ambulatory dysfunction, hypertension, COPD, type 2 diabetes mellitus and end-stage renal disease on hemodialysis. Pt fell with RW and suffered R tibial pilon fx with malunion, underwent ORIF by Dr Marcelino Scot 06/19/19   OT comments  Pt seen with PT for functional mobility progression. Pt declined OOB transfer but agreeable to sit EOB. Pt MOD I for bed mobility with use of bed rails and increased time. Pt performed lateral scoots to [facility] to simulate lateral scoot from EOB>w/c. Pt able to perform x2 sit>stand with RW +2 MIN guard- MIN A. Verbal cues throughout to maintain NWB status. Pt able to sit EOB ~ 4 minutes as precursor to higher level ADLs. Pt now agreeable to DC to SNF likely tomorrow. Will continue to follow for acute OT needs.   Follow Up Recommendations  Supervision/Assistance - 24 hour;SNF    Equipment Recommendations  3 in 1 bedside commode;Tub/shower bench;Other (comment)(drop arm BSC)    Recommendations for Other Services      Precautions / Restrictions Precautions Precautions: Fall Restrictions Weight Bearing Restrictions: Yes RLE Weight Bearing: Non weight bearing Other Position/Activity Restrictions: wound vac       Mobility Bed Mobility Overal bed mobility: Modified Independent Bed Mobility: Supine to Sit;Sit to Supine     Supine to sit: Supervision Sit to supine: Supervision   General bed mobility comments: Increased time and effort but required heavy use of bed railing.  Transfers Overall transfer level: Needs assistance Equipment used: Rolling walker (2 wheeled) Transfers: Lateral/Scoot Transfers;Sit to/from Stand Sit to Stand: +2 safety/equipment;Min guard;Min assist        Lateral/Scoot Transfers: Supervision(lateral scoot to<>from HOB) General transfer comment:  therapist foot under pts R foot to prevent WB RLE.  able to maintain NWB through 2 transfers.    Balance Overall balance assessment: Needs assistance;History of Falls Sitting-balance support: Feet supported;No upper extremity supported Sitting balance-Leahy Scale: Good     Standing balance support: Bilateral upper extremity supported Standing balance-Leahy Scale: Poor Standing balance comment: reliant on BUE support                           ADL either performed or assessed with clinical judgement   ADL                           Toilet Transfer: Supervision/safety;Cueing for safety;Minimal assistance Toilet Transfer Details (indicate cue type and reason): practiced lateral scooting in bed to simulate lateral scoot to<>from BSC; MIN verbal cues to maintain NWB status         Functional mobility during ADLs: Cueing for safety;Minimal assistance;+2 for physical assistance;+2 for safety/equipment General ADL Comments: Pt required cues throughout for safety and to maintain NWB status; able to sit>stand this session as precursor to higher level ADLs     Vision Baseline Vision/History: Wears glasses Vision Assessment?: No apparent visual deficits   Perception     Praxis      Cognition Arousal/Alertness: Awake/alert Behavior During Therapy: WFL for tasks assessed/performed;Impulsive Overall Cognitive Status: No family/caregiver present to determine baseline cognitive functioning Area of Impairment: Safety/judgement  Safety/Judgement: Decreased awareness of safety;Decreased awareness of deficits     General Comments: pt still limited by poor safety awareness and judgement during functional mobility; still impulsive and requires cues to maintain NWB        Exercises Exercises: General Lower Extremity;Other exercises General  Exercises - Lower Extremity Ankle Circles/Pumps: AROM;Left;10 reps Quad Sets: Both;5 reps;Supine Gluteal Sets: Both;5 reps;Supine Long Arc Quad: Strengthening;Right;Left;10 reps;Seated Straight Leg Raises: Strengthening;Right;5 reps;Supine(fatigues quickly) Toe Raises: Left;5 reps;Seated Heel Raises: AROM;Left;5 reps;Seated Other Exercises Other Exercises: bilat shoulder flexion, to reach overhead x 3, shoulder rolls x 5    Shoulder Instructions       General Comments      Pertinent Vitals/ Pain       Pain Assessment: No/denies pain Pain Score: 0-No pain  Home Living                                          Prior Functioning/Environment              Frequency  Min 2X/week        Progress Toward Goals  OT Goals(current goals can now be found in the care plan section)  Progress towards OT goals: Progressing toward goals  Acute Rehab OT Goals Patient Stated Goal: go home Time For Goal Achievement: 07/05/19 Potential to Achieve Goals: Fair  Plan      Co-evaluation    PT/OT/SLP Co-Evaluation/Treatment: Yes Reason for Co-Treatment: For patient/therapist safety;To address functional/ADL transfers PT goals addressed during session: Mobility/safety with mobility;Proper use of DME;Balance OT goals addressed during session: ADL's and self-care      AM-PAC OT "6 Clicks" Daily Activity     Outcome Measure   Help from another person eating meals?: None Help from another person taking care of personal grooming?: A Little Help from another person toileting, which includes using toliet, bedpan, or urinal?: A Lot Help from another person bathing (including washing, rinsing, drying)?: A Lot Help from another person to put on and taking off regular upper body clothing?: None Help from another person to put on and taking off regular lower body clothing?: A Little 6 Click Score: 18    End of Session Equipment Utilized During Treatment: Gait  belt;Rolling walker  OT Visit Diagnosis: History of falling (Z91.81);Pain   Activity Tolerance Patient tolerated treatment well   Patient Left     Nurse Communication Mobility status        Time: [id number] OT Time Calculation (min): 17 min  Charges: OT General Charges $OT Visit: 1 Visit  Aileen Pilot, [place] [facility] Rehabilitation [facility] [place] 06/24/2019, 12:56 PM

## 2019-06-24 NOTE — Progress Notes (Signed)
Physical Therapy Treatment Patient Details Name: Steven [name] MRN: [id number] DOB: 1961-01-28 Today's Date: 06/24/2019    History of Present Illness Steven [name] is a 58 y.o. male with medical history significant of history of R CVA, pulmonary embolism, alcohol abuse, ambulatory dysfunction, hypertension, COPD, type 2 diabetes mellitus and end-stage renal disease on hemodialysis. Pt fell with RW and suffered R tibial pilon fx with malunion, underwent ORIF by Dr Marcelino Scot 06/19/19    PT Comments    Pt agreeable to bed-level exercises only upon arrival, but was able to perform transfers with encouragement.  Pt progressing well with improved transfers; requiring only min guard (of +2 for safety) with RW and elevated bed.  Pt able to maintain NWB in RLE with 2 trials of sit to/from stand. However, not able to progress with gait this trial. Pt now agreeable to SNF prior to return home.  Will continue to follow acutely.     Follow Up Recommendations  SNF;Supervision for mobility/OOB     Equipment Recommendations  Wheelchair (measurements PT);3in1 (PT);Hospital bed;Wheelchair cushion (measurements PT)(long length slide board, drop arm commode,)    Recommendations for Other Services       Precautions / Restrictions Precautions Precautions: Fall Restrictions Weight Bearing Restrictions: Yes RLE Weight Bearing: Non weight bearing Other Position/Activity Restrictions: wound vac    Mobility  Bed Mobility Overal bed mobility: Modified Independent Bed Mobility: Supine to Sit;Sit to Supine     Supine to sit: Supervision Sit to supine: Supervision   General bed mobility comments: Increased time and effort but required heavy use of bed railing.  Transfers Overall transfer level: Needs assistance Equipment used: Rolling walker (2 wheeled) Transfers: Lateral/Scoot Transfers;Sit to/from Stand Sit to Stand: +2 safety/equipment;Min guard;Min assist        Lateral/Scoot Transfers:  Supervision(lateral scoot to head/foot of bed) General transfer comment:  therapist foot under pts R foot to prevent WB RLE.  able to maintain NWB through 2 transfers.  Ambulation/Gait   Gait Distance (Feet): 1 Feet Assistive device: Rolling walker (2 wheeled) Gait Pattern/deviations: Step-to pattern Gait velocity: decreased   General Gait Details: pt lacks sufficient UE strength to take full wt through arms and slide L foot to side. Difficulty hopping on LLE with side step. MinA+2 needed to move 27ft to head of bed. Therapist monitored RLE WB with foot under his; minimal to no pressure through RLE.      Balance Overall balance assessment: Needs assistance;History of Falls Sitting-balance support: Feet supported;No upper extremity supported Sitting balance-Leahy Scale: Good     Standing balance support: Bilateral upper extremity supported Standing balance-Leahy Scale: Fair Standing balance comment: Able to stand with RW with min guard for safety.                             Cognition Arousal/Alertness: Awake/alert Behavior During Therapy: WFL for tasks assessed/performed;Impulsive Overall Cognitive Status: No family/caregiver present to determine baseline cognitive functioning Area of Impairment: Safety/judgement                         Safety/Judgement: Decreased awareness of safety;Decreased awareness of deficits            Exercises General Exercises - Lower Extremity Ankle Circles/Pumps: AROM;Left;10 reps Quad Sets: Both;5 reps;Supine Gluteal Sets: Both;5 reps;Supine Long Arc Quad: Strengthening;Right;Left;10 reps;Seated Straight Leg Raises: Strengthening;Right;5 reps;Supine(fatigues quickly) Toe Raises: Left;5 reps;Seated Heel Raises: AROM;Left;5 reps;Seated Other Exercises Other Exercises:  bilat shoulder flexion, to reach overhead x 3, shoulder rolls x 5     General Comments        Pertinent Vitals/Pain Pain Assessment: 0-10 Pain Score:  0-No pain    Home Living                      Prior Function            PT Goals (current goals can now be found in the care plan section) Acute Rehab PT Goals Patient Stated Goal: go home PT Goal Formulation: With patient Time For Goal Achievement: 07/04/19 Potential to Achieve Goals: Fair    Frequency    Min 4X/week      PT Plan Current plan remains appropriate    Co-evaluation PT/OT/SLP Co-Evaluation/Treatment: Yes Reason for Co-Treatment: For patient/therapist safety;To address functional/ADL transfers PT goals addressed during session: Mobility/safety with mobility;Proper use of DME;Balance        AM-PAC PT "6 Clicks" Mobility   Outcome Measure  Help needed turning from your back to your side while in a flat bed without using bedrails?: None Help needed moving from lying on your back to sitting on the side of a flat bed without using bedrails?: None Help needed moving to and from a bed to a chair (including a wheelchair)?: A Little Help needed standing up from a chair using your arms (e.g., wheelchair or bedside chair)?: Total Help needed to walk in hospital room?: Total Help needed climbing 3-5 steps with a railing? : Total 6 Click Score: 14    End of Session Equipment Utilized During Treatment: Gait belt Activity Tolerance: Patient tolerated treatment well Patient left: with call bell/phone within reach;with chair alarm set;in bed(left sitting on comode as he requested increased time to sit and have a BM.) Nurse Communication: Mobility status PT Visit Diagnosis: Unsteadiness on feet (R26.81);Repeated falls (R29.6);Muscle weakness (generalized) (M62.81);History of falling (Z91.81);Difficulty in walking, not elsewhere classified (R26.2);Pain Pain - Right/Left: Right Pain - part of body: Ankle and joints of foot     Time: 1114-1130 PT Time Calculation (min) (ACUTE ONLY): 16 min  Charges:  $Therapeutic Exercise: 8-22 mins                     Kerin Perna, PTA Acute Rehab [phone]

## 2019-06-24 NOTE — Progress Notes (Signed)
CRITICAL VALUE ALERT  Critical Value: Phosphorus 1.0   Date & Time Notied:  06/24/2019 1824  Provider Notified: Holli Humbles, MD  Orders Received/Actions taken: MD text paged via [place]; Awaiting orders

## 2019-06-24 NOTE — Progress Notes (Signed)
Lamar Blinks, MD texted paged regarding pt critical lab value of phosphorus 1.0  Will await orders.

## 2019-06-24 NOTE — Progress Notes (Signed)
OT Cancellation Note  Patient Details Name: Steven [name] MRN: [id number] DOB: [birth year]   Cancelled Treatment:     Pt declining OT session this morning stating, " I need to wait and eat my breakfast and then go to HD." Will check back as time allows.   [place], [place] Acute Rehabilitation [facility] [place] 06/24/2019, 11:08 AM

## 2019-06-24 NOTE — Progress Notes (Addendum)
Subjective:  In room , hd pending today , no cos   Objective Vital signs in last 24 hours: Vitals:   06/22/19 2016 06/23/19 2109 06/24/19 0507 06/24/19 1454  BP: (!) 116/53 104/64 117/72 114/61  Pulse: 77 71 69 75  Resp: 17 15 16 18   Temp: 98.3 F (36.8 C) 98.3 F (36.8 C) 98 F (36.7 C) 98.9 F (37.2 C)  TempSrc: Oral Oral Oral Oral  SpO2: 95% 95% 93% 97%  Weight:      Height:       Weight change:   Physical Exam: Gen: alert male , NAD  Card : regular rhythm and normal rate, no rub  Resp:clear to auscultation bilaterally, no rales/rhonchi [id number], obese, NT [id number] leg in clean/dry cast and dressing / Left no pedal edema  Hemodialysis Access =Right BCF positive  Bruit    Dialysis: MWF [facility]   4h  2/2.5 bath  103kg   R BCF   Heparin 3000   Max UF 1.5L/hr  400/800 Epogen 6000u tiw Hectorol 72mcg tiw  Problem/Plan:  1. Right ankle fracture: underwent 06/20/19  ORIF of right distal tibia with right tibial/fibular arthrodesis and application of wound VAC.(VAC now off awaitng rehab center placement / Additional management plans per orthopedic surgery. 2. End-stage renal disease on hemodialysis: HD MWF at OP unit. Plan HD today Discontinued renal diet, continue carb modified with fluid restriction. 3. Hypertension /Vol :  vol okay / blood pressure under fair control, monitor postoperatively for titration of antihypertensive therapy/adjustment of ultrafiltration. 4. Anemia of chronic kidney disease:HGB  8.3 >9.3  Likely compounded by recent osseous injury and associated surgical loss, will adjust ESA dosing andmonitor hemoglobin  trend. 5. Secondary hyperparathyroidism:Calcium level within acceptable range, restart VDRA.Phos 2.0 on  9/14 , =3 phoslo decr to 1   with lowish phos  Fu labs   Ernest Haber, PA-C [facility] [phone] 06/24/2019,3:54 PM  LOS: 8 days   Pt seen, examined and agree w A/P as above.  Kelly Splinter   MD 06/24/2019, 4:17 PM    Labs: Basic Metabolic Panel: Recent Labs  Lab 06/19/19 1503 06/22/19 0802 06/22/19 1826  NA 136 133* 134*  K 3.8 3.9 3.7  CL  --  96* 96*  CO2  --  27 26  GLUCOSE 129* 222* 243*  BUN  --  29* 17  CREATININE  --  4.70* 2.55*  CALCIUM  --  8.1* 8.3*  PHOS  --  2.0*  --    Liver Function Tests: Recent Labs  Lab 06/22/19 0802  ALBUMIN 2.0*   No results for input(s): LIPASE, AMYLASE in the last 168 hours. No results for input(s): AMMONIA in the last 168 hours. CBC: Recent Labs  Lab 06/18/19 0121 06/19/19 0554  06/20/19 0331 06/22/19 0802 06/22/19 1826  WBC 5.7 5.5  --  5.6 4.1 5.3  HGB 9.7* 10.6*   < > 9.0* 8.3* 9.3*  HCT 30.6* 33.6*   < > 29.1* 27.4* 29.8*  MCV 86.4 86.4  --  87.4 89.8 88.4  PLT 143* 143*  --  133* 159 164   < > = values in this interval not displayed.   Cardiac Enzymes: No results for input(s): CKTOTAL, CKMB, CKMBINDEX, TROPONINI in the last 168 hours. CBG: Recent Labs  Lab 06/23/19 1150 06/23/19 1637 06/23/19 2112 06/24/19 0832 06/24/19 1152  GLUCAP 201* 90 216* 175* 158*    Studies/Results: No results found. Medications:  . sodium chloride   Intravenous  Once  . calcium acetate  2,001 mg Oral TID WC  . Chlorhexidine Gluconate Cloth  6 each Topical Q0600  . darbepoetin (ARANESP) injection - DIALYSIS  100 mcg Intravenous Q Wed-HD  . doxercalciferol  1 mcg Intravenous Q M,W,F-HD  . feeding supplement (PRO-STAT SUGAR FREE 64)  30 mL Oral BID  . furosemide  80 mg Oral Daily  . gabapentin  300 mg Oral QHS  . insulin aspart  0-9 Units Subcutaneous TID WC  . insulin detemir  10 Units Subcutaneous Daily  . metoprolol tartrate  12.5 mg Oral BID  . mirtazapine  15 mg Oral QHS  . multivitamin  1 tablet Oral QHS  . pantoprazole  40 mg Oral Daily  . polyethylene glycol  17 g Oral Daily  . senna-docusate  1 tablet Oral BID  . sodium bicarbonate  650 mg Oral BID  . spironolactone  25 mg Oral Daily  . warfarin  2  mg Oral ONCE-1800  . Warfarin - Pharmacist Dosing Inpatient   Does not apply [phone]

## 2019-06-24 NOTE — Progress Notes (Addendum)
Orthopedic Trauma Service Progress Note  Patient ID: Steven [name] MRN: [id number] DOB/AGE: [birth year] 57 y.o.  Subjective:  Doing well Talkative this am  Hopeful for SNF today    ROS As above  Objective:   VITALS:   Vitals:   06/22/19 1346 06/22/19 2016 06/23/19 2109 06/24/19 0507  BP: (!) 147/75 (!) 116/53 104/64 117/72  Pulse: 80 77 71 69  Resp: 14 17 15 16   Temp: 98.5 F (36.9 C) 98.3 F (36.8 C) 98.3 F (36.8 C) 98 F (36.7 C)  TempSrc: Oral Oral Oral Oral  SpO2: 90% 95% 95% 93%  Weight:      Height:        Estimated body mass index is 32.84 kg/m as calculated from the following:   Height as of this encounter: 5\' 11"  (1.803 m).   Weight as of this encounter: 106.8 kg.   Intake/Output      09/15 0701 - 09/16 0700 09/16 0701 - 09/17 0700   P.O. 120    Total Intake(mL/kg) 120 (1.1)    Urine (mL/kg/hr) 500 (0.2)    Other     Total Output 500    Net -380           LABS  Results for orders placed or performed during the hospital encounter of 06/16/19 (from the past 24 hour(s))  Glucose, capillary     Status: Abnormal   Collection Time: 06/23/19 11:50 AM  Result Value Ref Range   Glucose-Capillary 201 (H) 70 - 99 mg/dL  Glucose, capillary     Status: None   Collection Time: 06/23/19  4:37 PM  Result Value Ref Range   Glucose-Capillary 90 70 - 99 mg/dL  Glucose, capillary     Status: Abnormal   Collection Time: 06/23/19  9:12 PM  Result Value Ref Range   Glucose-Capillary 216 (H) 70 - 99 mg/dL  Protime-INR     Status: Abnormal   Collection Time: 06/24/19  1:23 AM  Result Value Ref Range   Prothrombin Time 21.6 (H) 11.4 - 15.2 seconds   INR 1.9 (H) 0.8 - 1.2  Glucose, capillary     Status: Abnormal   Collection Time: 06/24/19  8:32 AM  Result Value Ref Range   Glucose-Capillary 175 (H) 70 - 99 mg/dL     PHYSICAL EXAM:   Gen: sitting up in bed, NAD, appears well   Ext:      Right Lower Extremity              Splint fitting well             prevena with good seal and functioning well             No drainage in vac canister             EHL, FHL, lesser toe motor intact             Sensation intact             Foot warm   Changed to portable VAC/prevena unit    Assessment/Plan: 5 Days Post-Op   Principal Problem:   Ankle fracture Active Problems:   Diabetes mellitus type 2 in obese (HCC)   Hyperlipidemia   Morbid obesity (HCC)   Essential hypertension   End stage renal disease (  [place])   COPD, mild ([place])   Anti-infectives (From admission, onward)   Start     Dose/Rate Route Frequency Ordered Stop   06/20/19 1530  ceFAZolin (ANCEF) IVPB 1 g/50 mL premix  Status:  Discontinued     1 g 100 mL/hr over 30 Minutes Intravenous Every 24 hours 06/19/19 1857 06/20/19 1501   06/19/19 0800  ceFAZolin (ANCEF) IVPB 1 g/50 mL premix     1 g 100 mL/hr over 30 Minutes Intravenous To ShortStay Surgical 06/18/19 2202 06/19/19 1524    .  POD/HD#: 89  58 year old male with complex medical history with right distal tibia and fibula fracture   -Right distal tibia and fibula fracture, malunion/nonunion with maisonneuve fracture s/p ORIF tibia, and syndesmotic fusion              NWB R leg x 8 week             Splint x 10 more days then begin gentle ROM              Will remove prevena dressing at time of splint removal   Transitioned to portable unit   Make sure it is plugged into the outlet if pt is just sitting around   [facility] to unplug when mobilizing              Ice and elevate             Toe motion as tolerated             PT/OT                - Pain management:             Continue with current regimen    - ABL anemia/Hemodynamics             Stable   - Medical issues              per IM and renal    - DVT/PE prophylaxis:             coumadin chronically    - ID:              periop abx completed    - Metabolic Bone Disease:             vitamin d insufficiency              Chronic renal disease              Diabetes    - FEN/GI prophylaxis/Foley/Lines:             carb mod diet    - Impediments to fracture healing:             CKD on dialysis             etoh use             Diabetes              Chronic meds (lasix)             Poor balance             Snuff user    - Dispo:             continue with therapies              SNF   Follow up with ortho in 10 days    Prevena will stop working 14 days  from today pt need to come to office before that happens      Jari Pigg, PA-C [phone] (C) 06/24/2019, 11:29 AM  [facility] [place] [id number] [phone] (F)

## 2019-06-24 NOTE — TOC Progression Note (Signed)
Transition of Care [facility]) - Progression Note    Patient Details  Name: Steven [name] MRN: [id number] Date of Birth: Aug 28, 1961  Transition of Care [facility]) CM/SW Contact  Maaz Spiering, Edson Snowball, RN Phone Number: 06/24/2019, 8:50 AM  Clinical Narrative:     Damaris Schooner to Marianna Fuss at [facility]. Marianna Fuss can offer patient a bed . Will need another covid test.   Discussed with patient , he has accepted bed at [facility].   MD aware and will order covid test. Patient will have HD at [facility] today. Patient will transfer to [facility] tomorrow.   Left message for Zack with [place] a message.   Left Tommi Rumps with Alvis Lemmings a message. Expected Discharge Plan: [place] Barriers to Discharge: Continued Medical Work up  Expected Discharge Plan and Services Expected Discharge Plan: Eugenio Saenz In-house Referral: Clinical Social Work Discharge Planning Services: CM Consult, Follow-up appt scheduled, Other - See comment(TOC for meds) Post Acute Care Choice: Durable Medical Equipment, Home Health Living arrangements for the past 2 months: [place]                 DME Arranged: Wheelchair manual, Hospital bed, 3-N-1 DME Agency: [employer]       HH Arranged: PT, OT [facility] Agency: [place] Date [place]: 06/23/19 Time [place]: 26 Representative spoke with at [place]: [place] (SDOH) Interventions    Readmission Risk Interventions Readmission Risk Prevention Plan 06/23/2019 02/13/2019  Transportation Screening Complete Complete  Medication Review Press photographer) Complete Complete  PCP or Specialist appointment within 3-5 days of discharge Complete Complete  HRI or [place] Complete Complete  SW Recovery Care/Counseling Consult Complete Complete  Palliative Care Screening Not Applicable Not [place] Patient Refused  Complete  Some recent data might be hidden

## 2019-06-24 NOTE — Care Management Important Message (Signed)
Important Message  Patient Details  Name: Steven [name] MRN: [id number] Date of Birth: 10/27/1960   Medicare Important Message Given:  Yes     Memory Argue 06/24/2019, 2:35 PM

## 2019-06-24 NOTE — Progress Notes (Signed)
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 Codeine Nausea And Vomiting    Patient Measurements: Height: 5\' 11"  (180.3 cm) Weight: 235 lb 7.2 oz (106.8 kg) IBW/kg (Calculated) : 75.3  Vital Signs: Temp: 98 F (36.7 C) (09/16 0507) Temp Source: Oral (09/16 0507) BP: 117/72 (09/16 0507) Pulse Rate: 69 (09/16 0507)  Labs: Recent Labs    06/22/19 0802 06/22/19 1826 06/23/19 0051 06/24/19 0123  HGB 8.3* 9.3*  --   --   HCT 27.4* 29.8*  --   --   PLT 159 164  --   --   LABPROT  --  20.8* 22.5* 21.6*  INR  --  1.8* 2.0* 1.9*  CREATININE 4.70* 2.55*  --   --     Estimated Creatinine Clearance: 39.7 mL/min (A) (by C-G formula based on SCr of 2.55 mg/dL (H)).  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 PTA Coumadin: 7.5 mg TTSS, none on dialysis days.  He reports some recent bleeding when coming off HD. Last PTA dose of Coumadin on 9/7. INR 8.4 on admit 9/8 with right ankle fracture.  Vitamin K 2.5 mg PO given on 9/8 and INR down but remained supratherapeutic. FFP given in the morning prior to trip to OR. Patient's INR yesterday was 1.8 (drawn late).   9/15: INR is 1.9 today. Patient not due for warfarin today, but due to subtherapeutic INR will try to give a small dose for today.  Goal of Therapy:  INR 2-3 Monitor platelets by anticoagulation protocol: Yes   Plan:  Warfarin 2 mg PO x 1  Daily INR / CBC   Tyshan Enderle A. Levada Dy, PharmD, BCPS, FNKF Clinical Pharmacist [place] Please utilize Amion for appropriate phone number to reach the unit pharmacist ([place])

## 2019-06-24 NOTE — Discharge Summary (Addendum)
Physician Discharge Summary  Steven [name] [id number] DOB: 1961-05-25 DOA: 06/16/2019  PCP: Maryruth Hancock, MD  Admit date: 06/16/2019 Discharge date: 06/24/2019  Admitted From: Home Disposition: [facility]  Recommendations for Outpatient Follow-up:  1. Follow up with PCP in 1-2 weeks 2. Please obtain BMP/CBC/INR within one week of discharge  Discharge Condition: Stable CODE STATUS: Full Diet recommendation: Renal diet, low carb diet  Brief/Interim Summary: Steven [name] is a 58 y.o. male with medical history significant of history of pulmonary embolism, alcohol abuse, ambulatory dysfunction, hypertension, COPD, type 2 diabetes mellitus and end-stage renal disease on hemodialysis. Patient uses a walker for ambulation.  Yesterday while trying to get bathroom he lost his balance while holding his walker and twisted his right ankle.  No head trauma no loss of consciousness.  Post trauma he had significant pain on his right ankle, sharp in nature, 10 out of 10 in intensity, worse with weightbearing, associated with local edema, no fevers, no chills.  No improving factors.  Yesterday he missed his hemodialysis session due to right ankle pain. Today he presented to the hospital due to persistent symptoms. In ED patient was diagnosed with right ankle fracture, orthopedics was consulted, recommendations for splinting, remain nonweightbearing to the right lower extremity and transferred to [facility], for further surgical evaluation.  Patient admitted as above after mechanical fall with notable right ankle fracture.  She is subsequently valuated by orthopedic surgery status post ORIF on 06/19/2019.  Wound VAC in place per recommendations with subsequent management per their expertise at discharge.  She will continue on home Coumadin for DVT prophylaxis, follow-up with orthopedics scheduled for postop evaluation and clearance.  She continues to be recommended for no weightbearing again per orthopedic  recommendations, and ongoing need for assistance and poor ambulatory status patient has been evaluated and deemed appropriate for SNF -awaiting COVID testing for placement.  Issues other chronic comorbid conditions remain well controlled including hypertension, ESRD on dialysis Monday Wednesday Friday, INR was markedly supratherapeutic at intake at 8.4, now well controlled on 7.5 mg tablet on nondialysis days, patient will need to maintain appropriate diet and follow-up closely in the outpatient setting to ensure INR remains therapeutic.  Otherwise stable and agreeable for discharge once COVID swab has been evaluated and patient has safe disposition at SNF with insurance approval.  Discharge Diagnoses:  Principal Problem:   Ankle fracture Active Problems:   Diabetes mellitus type 2 in obese ([place])   Hyperlipidemia   Morbid obesity ([place])   Essential hypertension   End stage renal disease ([place])   COPD, mild ([place])   Discharge Instructions   Allergies as of 06/24/2019      Reactions   Codeine Nausea And Vomiting      Medication List    STOP taking these medications   apixaban 5 MG Tabs tablet Commonly known as: ELIQUIS     TAKE these medications   acetaminophen 325 MG tablet Commonly known as: TYLENOL Take 2 tablets (650 mg total) by mouth every 12 (twelve) hours.   calcium acetate 667 MG capsule Commonly known as: PHOSLO Take 2 capsules (1,334 mg total) by mouth 3 (three) times daily with meals. What changed: how much to take   furosemide 80 MG tablet Commonly known as: LASIX Take 80 mg by mouth daily.   gabapentin 300 MG capsule Commonly known as: NEURONTIN Take 300 mg by mouth at bedtime.   insulin aspart 100 UNIT/ML injection Commonly known as: novoLOG Inject 6 Units  into the skin 3 (three) times daily with meals. What changed:   how much to take  additional instructions   insulin detemir 100 UNIT/ML injection Commonly known as: LEVEMIR Inject 0.1 mLs (10 Units  total) into the skin daily. What changed: how much to take   metoprolol tartrate 25 MG tablet Commonly known as: LOPRESSOR Take 0.5 tablets (12.5 mg total) by mouth 2 (two) times daily.   midodrine 10 MG tablet Commonly known as: PROAMATINE Take 1 tablet (10 mg total) by mouth 2 (two) times daily with a meal. What changed:   when to take this  reasons to take this   mirtazapine 15 MG tablet Commonly known as: REMERON Take 15 mg by mouth at bedtime as needed.   mometasone-formoterol 100-5 MCG/ACT Aero Commonly known as: Dulera Inhale 2 puffs into the lungs every morning. What changed:   when to take this  reasons to take this   omeprazole 20 MG tablet Commonly known as: PRILOSEC OTC Take 1 tablet (20 mg total) by mouth daily.   oxyCODONE-acetaminophen 5-325 MG tablet Commonly known as: PERCOCET/ROXICET Take 1-2 tablets by mouth every 8 (eight) hours as needed for moderate pain or severe pain.   ProAir HFA 108 (90 Base) MCG/ACT inhaler Generic drug: albuterol Inhale 2 puffs into the lungs every 4 (four) hours as needed for wheezing or shortness of breath.   sodium bicarbonate 650 MG tablet Take 1 tablet (650 mg total) by mouth 2 (two) times daily. What changed: when to take this   spironolactone 25 MG tablet Commonly known as: ALDACTONE Take 25 mg by mouth daily.   warfarin 5 MG tablet Commonly known as: COUMADIN Take 1.5 tablets (7.5 mg total) by mouth as directed. Take 7.5mg  on non-dialysis days only (Tuesdays, Thursdays, Saturdays, and "Sundays). What changed:   when to take this  additional instructions            Durable Medical Equipment  (From admission, onward)         Start     Ordered   06/23/19 1610  For home use only DME Tub bench  Once    Comments: Call patient for delivery to home Thanks   06/23/19 1609   06/23/19 1609  For home use only DME Other see comment  Once    Comments: Long slide board   To be delivered to patient's  hospital room prior to discharge  Question:  Length of Need  Answer:  6 Months   06/23/19 1608   06/23/19 1607  For home use only DME 3 n 1  Once    Comments: Drop arm commode   Patient wants you to call him for delivery Thanks   06/23/19 1607   06/23/19 1606  For home use only DME Hospital bed  Once    Comments: Patient wants it delivered after he is discharged.  Question Answer Comment  Length of Need 6 Months   Patient has (list medical condition): Ankle fracture   The above medical condition requires: Patient requires the ability to reposition frequently   Head must be elevated greater than: 45 degrees   Bed type Semi-electric   Support Surface: Gel Overlay      09" /15/20 1605   06/23/19 1604  For home use only DME lightweight manual wheelchair with seat cushion  Once    Comments: Patient suffers from  Ankle fracture which impairs their ability to perform daily activities like ambulating  in the home.  A cane  will not resolve  issue with performing activities of daily living. A wheelchair will allow patient to safely perform daily activities. Patient is not able to propel themselves in the home using a standard weight wheelchair due to weakness. Patient can self propel in the lightweight wheelchair. Length of need 6 months Accessories: elevating leg rests (ELRs), wheel locks, extensions and anti-tippers.   Seat and back cushion  Elevating leg rests   To be delivered to patient's hospital room   06/23/19 1604          Contact information for follow-up providers    Corum, Rex Kras, MD. Go on 07/07/2019.   Specialty: Family Medicine Why: 2:20pm; wear a mask and f/u if this appointment time does not work Contact information: [place] [address] [phone]        Altamese Brock, MD. Schedule an appointment as soon as possible for a visit in 50 day(s).   Specialty: Orthopedic Surgery Contact information: [place]  [address] [phone]            Contact information for after-discharge care    [facility] .   Service: Skilled Nursing Contact information: [address] [place] [address] [phone]                 Allergies  Allergen Reactions  . Codeine Nausea And Vomiting    Consultations:  Orthopedic surgery   Procedures/Studies: Dg Ankle 2 Views Right  Result Date: 06/19/2019 CLINICAL DATA:  ORIF right ankle fracture. EXAM: RIGHT ANKLE - 2 VIEW; DG C-ARM 1-60 MIN COMPARISON:  Preoperative radiographs. FINDINGS: Thirteen fluoroscopic spot images from the operating room provided. Images demonstrate sequential lateral plate and multi screw fixation of distal tibial fracture. Plate and multi screw fixation including syndesmotic screws of the distal fibula. AP view of the proximal fibula demonstrates fibular neck fracture, age indeterminate on fluoroscopic spot view. Total fluoroscopy time 1 minutes 22 seconds. IMPRESSION: Fluoroscopic spot images during ORIF of ankle fractures. Electronically Signed   By: Keith Rake M.D.   On: 06/19/2019 19:50   Dg Ankle Complete Right  Result Date: 06/19/2019 CLINICAL DATA:  ORIF ankle fractures. EXAM: RIGHT ANKLE - COMPLETE 3+ VIEW COMPARISON:  None. FINDINGS: Internal plate and screw fixation noted traversing distal tibial and fibular fractures, in near-anatomic alignment and position. No definite complicating features are identified. IMPRESSION: ORIF distal tibial and fibular fractures, in near-anatomic alignment and position. Electronically Signed   By: Margarette Canada M.D.   On: 06/19/2019 20:35   Dg Ankle Complete Right  Result Date: 06/16/2019 CLINICAL DATA:  Fall 2 days ago with persistent ankle pain and swelling, initial encounter EXAM: RIGHT ANKLE - COMPLETE 3+ VIEW COMPARISON:  None. FINDINGS: Oblique fracture through the distal tibial metaphysis is noted with mild  displacement at the fracture site. Prior callus formation in the distal fibula is noted related to prior fracture. No other fracture is identified. IMPRESSION: Oblique fracture through the distal tibial metaphysis. Electronically Signed   By: Inez Catalina M.D.   On: 06/16/2019 14:29   Ct Head Wo Contrast  Result Date: 06/16/2019 CLINICAL DATA:  Status post fall.  Anticoagulation. EXAM: CT HEAD WITHOUT CONTRAST TECHNIQUE: Contiguous axial images were obtained from the base of the skull through the vertex without intravenous contrast. COMPARISON:  02/12/2019 FINDINGS: Brain: No evidence of acute infarction, hemorrhage, hydrocephalus, extra-axial collection or mass lesion/mass effect. There is mild  diffuse low-attenuation within the subcortical and periventricular white matter compatible with chronic microvascular disease. Prominence of sulci and ventricles compatible with brain atrophy. Vascular: No hyperdense vessel or unexpected calcification. Skull: Normal. Negative for fracture or focal lesion. Sinuses/Orbits: No acute finding. Other: None. IMPRESSION: 1. No acute intracranial abnormality 2. Chronic small vessel ischemic change and brain atrophy. Electronically Signed   By: Kerby Moors M.D.   On: 06/16/2019 18:35   Ct Ankle Right Wo Contrast  Result Date: 06/16/2019 CLINICAL DATA:  Evaluate ankle fractures. EXAM: CT OF THE RIGHT ANKLE WITHOUT CONTRAST TECHNIQUE: Multidetector CT imaging of the right ankle was performed according to the standard protocol. Multiplanar CT image reconstructions were also generated. COMPARISON:  Radiographs 06/16/2019 FINDINGS: Remote appearing ununited oblique coursing distal tibia fracture. Extensive bony resorptive changes along the fracture margins but no osseous bridging. There is callus formation noted along the lateral aspect of the tibial cortex and some bony bridging across the upper lateral fracture site. Maximum of 8 mm of separation along the medial aspect of the  fracture and no callus formation. There could be acute on chronic fractures. There is a largely healed distal fibular fracture with fairly extensive callus formation. The ankle mortise is maintained. No fractures of the talus or calcaneus. No ankle joint effusion. The subtalar joints are maintained. Extensive small vessel disease with extensive calcifications. IMPRESSION: 1. Remote appearing ununited distal tibia fractures. Possible acute on chronic fractures. 2. Healed distal fibular fracture. 3. Intact ankle mortise. No mid or hindfoot fractures are identified. 4. Extensive small vessel disease. Electronically Signed   By: Marijo Sanes M.D.   On: 06/16/2019 18:56   Dg C-arm 1-60 Min  Result Date: 06/19/2019 CLINICAL DATA:  ORIF right ankle fracture. EXAM: RIGHT ANKLE - 2 VIEW; DG C-ARM 1-60 MIN COMPARISON:  Preoperative radiographs. FINDINGS: Thirteen fluoroscopic spot images from the operating room provided. Images demonstrate sequential lateral plate and multi screw fixation of distal tibial fracture. Plate and multi screw fixation including syndesmotic screws of the distal fibula. AP view of the proximal fibula demonstrates fibular neck fracture, age indeterminate on fluoroscopic spot view. Total fluoroscopy time 1 minutes 22 seconds. IMPRESSION: Fluoroscopic spot images during ORIF of ankle fractures. Electronically Signed   By: Keith Rake M.D.   On: 06/19/2019 19:50    Subjective: No acute events or issues overnight, denies chest pain, shortness of breath, nausea, vomiting, diarrhea, constipation, headache, fevers, chills.   Discharge Exam: Vitals:   06/23/19 2109 06/24/19 0507  BP: 104/64 117/72  Pulse: 71 69  Resp: 15 16  Temp: 98.3 F (36.8 C) 98 F (36.7 C)  SpO2: 95% 93%   Vitals:   06/22/19 1346 06/22/19 2016 06/23/19 2109 06/24/19 0507  BP: (!) 147/75 (!) 116/53 104/64 117/72  Pulse: 80 77 71 69  Resp: 14 17 15 16   Temp: 98.5 F (36.9 C) 98.3 F (36.8 C) 98.3 F (36.8  C) 98 F (36.7 C)  TempSrc: Oral Oral Oral Oral  SpO2: 90% 95% 95% 93%  Weight:      Height:       General:  Pleasantly resting in bed, No acute distress. HEENT:  Normocephalic atraumatic.  Sclerae nonicteric, noninjected.  Extraocular movements intact bilaterally. Neck:  Without mass or deformity.  Trachea is midline. Lungs:  Clear to auscultate bilaterally without rhonchi, wheeze, or rales. Heart:  Regular rate and rhythm.  Without murmurs, rubs, or gallops. Abdomen:  Soft, nontender, nondistended.  Without guarding or rebound. Extremities: Right foot  bandage clean dry intact -wound VAC ongoing without overt leak. Vascular:  Dorsalis pedis and posterior tibial pulses palpable bilaterally. Skin:  Warm and dry, no erythema, no ulcerations.  The results of significant diagnostics from this hospitalization (including imaging, microbiology, ancillary and laboratory) are listed below for reference.     Microbiology: Recent Results (from the past 240 hour(s))  SARS CORONAVIRUS 2 (TAT 6-24 HRS) Nasopharyngeal Nasopharyngeal Swab     Status: None   Collection Time: 06/16/19  3:36 PM   Specimen: Nasopharyngeal Swab  Result Value Ref Range Status   SARS Coronavirus 2 NEGATIVE NEGATIVE Final    Comment: (NOTE) SARS-CoV-2 target nucleic acids are NOT DETECTED. The SARS-CoV-2 RNA is generally detectable in upper and lower respiratory specimens during the acute phase of infection. Negative results do not preclude SARS-CoV-2 infection, do not rule out co-infections with other pathogens, and should not be used as the sole basis for treatment or other patient management decisions. Negative results must be combined with clinical observations, patient history, and epidemiological information. The expected result is Negative. Fact Sheet for Patients: SugarRoll.be Fact Sheet for Healthcare Providers: https://www.woods-mathews.com/ This test is not yet  approved or cleared by the [place] FDA and  has been authorized for detection and/or diagnosis of SARS-CoV-2 by FDA under an Emergency Use Authorization (EUA). This EUA will remain  in effect (meaning this test can be used) for the duration of the COVID-19 declaration under Section 56 4(b)(1) of the Act, 21 U.S.C. section 360bbb-3(b)(1), unless the authorization is terminated or revoked sooner. Performed at [facility], [place] [address] [place], [place] [address]   Surgical pcr screen     Status: Abnormal   Collection Time: 06/18/19 10:25 PM   Specimen: Nasal Mucosa; Nasal Swab  Result Value Ref Range Status   MRSA, PCR POSITIVE (A) NEGATIVE Final    Comment: CRITICAL RESULT CALLED TO, READ BACK BY AND VERIFIED WITH: RN,NANCY CAGUIOA [id number] @0238  THANEY    Staphylococcus aureus POSITIVE (A) NEGATIVE Final    Comment: CRITICAL RESULT CALLED TO, READ BACK BY AND VERIFIED WITH: RNHosie Spangle [id number] @0238  THANEY Performed at [facility] [address], [place], [place] [address]      Labs:  BNP (last 3 results) Recent Labs    02/11/19 0032  BNP [id number]*   Basic Metabolic Panel: Recent Labs  Lab 06/19/19 1503 06/22/19 0802 06/22/19 1826  NA 136 133* 134*  K 3.8 3.9 3.7  CL  --  96* 96*  CO2  --  27 26  GLUCOSE 129* 222* 243*  BUN  --  29* 17  CREATININE  --  4.70* 2.55*  CALCIUM  --  8.1* 8.3*  PHOS  --  2.0*  --    Liver Function Tests: Recent Labs  Lab 06/22/19 0802  ALBUMIN 2.0*   CBC: Recent Labs  Lab 06/18/19 0121 06/19/19 0554 06/19/19 1503 06/20/19 0331 06/22/19 0802 06/22/19 1826  WBC 5.7 5.5  --  5.6 4.1 5.3  HGB 9.7* 10.6* 9.9* 9.0* 8.3* 9.3*  HCT 30.6* 33.6* 29.0* 29.1* 27.4* 29.8*  MCV 86.4 86.4  --  87.4 89.8 88.4  PLT 143* 143*  --  133* 159 164   CBG: Recent Labs  Lab 06/23/19 0845 06/23/19 1150 06/23/19 1637 06/23/19 2112 06/24/19 0832  GLUCAP 252* 201* 90 216* 175*   Urinalysis    Component Value  Date/Time   COLORURINE RED (A) 02/11/2019 1336   APPEARANCEUR HAZY (A) 02/11/2019  1336   LABSPEC 1.010 02/11/2019 1336   PHURINE 7.0 02/11/2019 1336   GLUCOSEU 250 (A) 02/11/2019 1336   HGBUR LARGE (A) 02/11/2019 1336   BILIRUBINUR NEGATIVE 02/11/2019 1336   KETONESUR TRACE (A) 02/11/2019 1336   PROTEINUR >300 (A) 02/11/2019 1336   NITRITE POSITIVE (A) 02/11/2019 1336   LEUKOCYTESUR SMALL (A) 02/11/2019 1336   Microbiology Recent Results (from the past 240 hour(s))  SARS CORONAVIRUS 2 (TAT 6-24 HRS) Nasopharyngeal Nasopharyngeal Swab     Status: None   Collection Time: 06/16/19  3:36 PM   Specimen: Nasopharyngeal Swab  Result Value Ref Range Status   SARS Coronavirus 2 NEGATIVE NEGATIVE Final    Comment: (NOTE) SARS-CoV-2 target nucleic acids are NOT DETECTED. The SARS-CoV-2 RNA is generally detectable in upper and lower respiratory specimens during the acute phase of infection. Negative results do not preclude SARS-CoV-2 infection, do not rule out co-infections with other pathogens, and should not be used as the sole basis for treatment or other patient management decisions. Negative results must be combined with clinical observations, patient history, and epidemiological information. The expected result is Negative. Fact Sheet for Patients: SugarRoll.be Fact Sheet for Healthcare Providers: https://www.woods-mathews.com/ This test is not yet approved or cleared by the [place] FDA and  has been authorized for detection and/or diagnosis of SARS-CoV-2 by FDA under an Emergency Use Authorization (EUA). This EUA will remain  in effect (meaning this test can be used) for the duration of the COVID-19 declaration under Section 56 4(b)(1) of the Act, 21 U.S.C. section 360bbb-3(b)(1), unless the authorization is terminated or revoked sooner. Performed at [facility], [place] [address], [place], [place] [address]   Surgical pcr  screen     Status: Abnormal   Collection Time: 06/18/19 10:25 PM   Specimen: Nasal Mucosa; Nasal Swab  Result Value Ref Range Status   MRSA, PCR POSITIVE (A) NEGATIVE Final    Comment: CRITICAL RESULT CALLED TO, READ BACK BY AND VERIFIED WITH: RN,NANCY CAGUIOA [id number] @0238  THANEY    Staphylococcus aureus POSITIVE (A) NEGATIVE Final    Comment: CRITICAL RESULT CALLED TO, READ BACK BY AND VERIFIED WITH: RNHosie Spangle [id number] @0238  THANEY Performed at [facility] [address], [place], [place] [address]     Time coordinating discharge: Over 30 minutes  SIGNED:  Little Ishikawa, DO [facility] 06/24/2019, 11:41 AM Pager   If 7PM-7AM, please contact night-coverage [email or web address] Password [email or web address]

## 2019-06-25 DIAGNOSIS — F329 Major depressive disorder, single episode, unspecified: Secondary | ICD-10-CM | POA: Diagnosis present

## 2019-06-25 DIAGNOSIS — S82301A Unspecified fracture of lower end of right tibia, initial encounter for closed fracture: Secondary | ICD-10-CM

## 2019-06-25 DIAGNOSIS — R791 Abnormal coagulation profile: Secondary | ICD-10-CM

## 2019-06-25 DIAGNOSIS — Z992 Dependence on renal dialysis: Secondary | ICD-10-CM

## 2019-06-25 DIAGNOSIS — J449 Chronic obstructive pulmonary disease, unspecified: Secondary | ICD-10-CM | POA: Diagnosis present

## 2019-06-25 DIAGNOSIS — E118 Type 2 diabetes mellitus with unspecified complications: Secondary | ICD-10-CM

## 2019-06-25 DIAGNOSIS — F32A Depression, unspecified: Secondary | ICD-10-CM | POA: Diagnosis present

## 2019-06-25 DIAGNOSIS — E7800 Pure hypercholesterolemia, unspecified: Secondary | ICD-10-CM

## 2019-06-25 DIAGNOSIS — F320 Major depressive disorder, single episode, mild: Secondary | ICD-10-CM

## 2019-06-25 LAB — GLUCOSE, CAPILLARY
Glucose-Capillary: 166 mg/dL — ABNORMAL HIGH (ref 70–99)
Glucose-Capillary: 223 mg/dL — ABNORMAL HIGH (ref 70–99)
Glucose-Capillary: 232 mg/dL — ABNORMAL HIGH (ref 70–99)
Glucose-Capillary: 193 mg/dL — ABNORMAL HIGH (ref 70–99)

## 2019-06-25 LAB — PROTIME-INR
INR: 2.3 — ABNORMAL HIGH (ref 0.8–1.2)
Prothrombin Time: 24.6 seconds — ABNORMAL HIGH (ref 11.4–15.2)

## 2019-06-25 LAB — BASIC METABOLIC PANEL
Anion gap: 10 (ref 5–15)
Anion gap: 11 (ref 5–15)
BUN: 18 mg/dL (ref 6–20)
BUN: 26 mg/dL — ABNORMAL HIGH (ref 6–20)
CO2: 28 mmol/L (ref 22–32)
CO2: 26 mmol/L (ref 22–32)
Calcium: 8.3 mg/dL — ABNORMAL LOW (ref 8.9–10.3)
Calcium: 8.1 mg/dL — ABNORMAL LOW (ref 8.9–10.3)
Chloride: 99 mmol/L (ref 98–111)
Chloride: 97 mmol/L — ABNORMAL LOW (ref 98–111)
Creatinine, Ser: 2.66 mg/dL — ABNORMAL HIGH (ref 0.61–1.24)
Creatinine, Ser: 3.59 mg/dL — ABNORMAL HIGH (ref 0.61–1.24)
GFR calc Af Amer: 30 mL/min — ABNORMAL LOW (ref >60)
GFR calc Af Amer: 21 mL/min — ABNORMAL LOW (ref >60)
GFR calc non Af Amer: 25 mL/min — ABNORMAL LOW (ref >60)
GFR calc non Af Amer: 18 mL/min — ABNORMAL LOW (ref >60)
Glucose, Bld: 142 mg/dL — ABNORMAL HIGH (ref 70–99)
Glucose, Bld: 189 mg/dL — ABNORMAL HIGH (ref 70–99)
Potassium: 3.8 mmol/L (ref 3.5–5.1)
Potassium: 3.6 mmol/L (ref 3.5–5.1)
Sodium: 137 mmol/L (ref 135–145)
Sodium: 134 mmol/L — ABNORMAL LOW (ref 135–145)

## 2019-06-25 LAB — PHOSPHORUS
Phosphorus: <1 mg/dL — CL (ref 2.5–4.6)
Phosphorus: 1.7 mg/dL — ABNORMAL LOW (ref 2.5–4.6)
Phosphorus: 2.2 mg/dL — ABNORMAL LOW (ref 2.5–4.6)

## 2019-06-25 MED ORDER — WARFARIN SODIUM 7.5 MG PO TABS
7.5000 mg | ORAL_TABLET | Freq: Once | ORAL | Status: AC
  Administered 2019-06-25: 7.5 mg via ORAL
  Filled 2019-06-25: qty 1

## 2019-06-25 MED ORDER — SODIUM PHOSPHATES 45 MMOLE/15ML IV SOLN
20.0000 mmol | Freq: Once | INTRAVENOUS | Status: AC
  Administered 2019-06-25: 20 mmol via INTRAVENOUS
  Filled 2019-06-25 (×2): qty 6.67

## 2019-06-25 MED ORDER — SODIUM PHOSPHATES 45 MMOLE/15ML IV SOLN
10.0000 mmol | Freq: Once | INTRAVENOUS | Status: AC
  Administered 2019-06-25: 10 mmol via INTRAVENOUS
  Filled 2019-06-25: qty 3.33

## 2019-06-25 NOTE — Progress Notes (Signed)
Nutrition Follow-up  RD working remotely.  DOCUMENTATION CODES:   Obesity unspecified  INTERVENTION:   -Continue 30 ml Prostat BID, each supplement provides 100 kcals and 15 grams protein -Continue Renal MVI daily  NUTRITION DIAGNOSIS:   Increased nutrient needs related to post-op healing as evidenced by estimated needs.  Ongoing  GOAL:   Patient will meet greater than or equal to 90% of their needs  Progressing   MONITOR:   PO intake, Supplement acceptance, Labs, Weight trends, Skin, I & O's  REASON FOR ASSESSMENT:   Consult Diet education  ASSESSMENT:   Steven [name] is a 58 y.o. male with medical history significant of history of pulmonary embolism, alcohol abuse, ambulatory dysfunction, hypertension, COPD, type 2 diabetes mellitus and end-stage renal disease on hemodialysis.  9/11- s/p Procedure(s): 1.OPEN REDUCTION INTERNAL FIXATION (ORIF)RIGHT TIBIA PILON FRACTURE NONUNION, TIBIA ONLY(Right) 2. RIGHT TIBIA AND FIBULA ARTHRODESIS 3. APPLICATION OF PROVENA WOUND VAC   Reviewed I/O's: +770 ml x 24 hours and -2.3 L since admission  Pt making good progress and with good appetite. Noted meal completion 80-100%. Pt is compliant with Prostat supplements.   Therapies recommending SNF placement. Per CSW notes, plan to [facility].c to [facility] once medically stable (likley tomorrow).   Labs reviewed: Phos: <1 (on IV supplementation), CBGS: 121-166 (inpatient orders for glycemic control are 0-9 units insulin aspart TID with meals and 10 units insulin detemir daily).   Diet Order:   Diet Order            Diet renal/carb modified with fluid restriction Diet-HS Snack? Nothing; Fluid restriction: 1200 mL Fluid; Room service appropriate? Yes; Fluid consistency: Thin  Diet effective now              EDUCATION NEEDS:   No education needs have been identified at this time  Skin:  Skin Assessment: Skin Integrity Issues: Skin Integrity Issues:: Wound  VAC Wound Vac: rt leg  Last BM:  06/24/19  Height:   Ht Readings from Last 1 Encounters:  06/16/19 5\' 11"  (1.803 m)    Weight:   Wt Readings from Last 1 Encounters:  06/24/19 105.1 kg    Ideal Body Weight:  78.2 kg  BMI:  Body mass index is 32.32 kg/m.  Estimated Nutritional Needs:   Kcal:  2150-2350  Protein:  115-130 grams  Fluid:  1000 ml +UOP    Sirron Francesconi A. Jimmye Norman, RD, LDN, [place] Registered Dietitian II Certified Diabetes Care and Education Specialist Pager: [phone] After hours Pager: [phone]

## 2019-06-25 NOTE — Progress Notes (Signed)
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 Codeine Nausea And Vomiting    Patient Measurements: Height: 5\' 11"  (180.3 cm) Weight: 231 lb 11.3 oz (105.1 kg) IBW/kg (Calculated) : 75.3  Vital Signs: Temp: 98.6 F (37 C) (09/17 0448) Temp Source: Oral (09/17 0448) BP: 123/77 (09/17 0448) Pulse Rate: 74 (09/17 0448)  Labs: Recent Labs    06/22/19 0802  06/22/19 1826 06/23/19 0051 06/24/19 0123 06/24/19 1652 06/25/19 0145  HGB 8.3*  --  9.3*  --   --  8.6*  --   HCT 27.4*  --  29.8*  --   --  27.0*  --   PLT 159  --  164  --   --  200  --   LABPROT  --    < > 20.8* 22.5* 21.6*  --  24.6*  INR  --    < > 1.8* 2.0* 1.9*  --  2.3*  CREATININE 4.70*  --  2.55*  --   --  4.63* 2.66*   < > = values in this interval not displayed.    Estimated Creatinine Clearance: 37.8 mL/min (A) (by C-G formula based on SCr of 2.66 mg/dL (H)).  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 PTA Coumadin: 7.5 mg TTSS, none on dialysis days.  He reports some recent bleeding when coming off HD. Last PTA dose of Coumadin on 9/7. INR 8.4 on admit 9/8 with right ankle fracture.  Vitamin K 2.5 mg PO given on 9/8 and INR down but remained supratherapeutic. FFP given in the morning prior to trip to OR.  9/17: INR is 2.3 today. Patient not given 2mg  dose last night due to being in HD. INR therapeutic today. Will resume home dose of 7.5mg  tonight. No bleeding noted.   Goal of Therapy:  INR 2-3 Monitor platelets by anticoagulation protocol: Yes   Plan:  Warfarin 7.5 mg PO x 1  Daily INR / CBC   Kiarra Kidd A. Levada Dy, PharmD, BCPS, FNKF Clinical Pharmacist [place] Please utilize Amion for appropriate phone number to reach the unit pharmacist ([place])

## 2019-06-25 NOTE — Progress Notes (Addendum)
Subjective:  tolerated hd last pm ,states for dc tomor to [facility] rehab   Objective Vital signs in last 24 hours: Vitals:   06/24/19 2131 06/24/19 2326 06/25/19 0448 06/25/19 1300  BP: 138/69 (!) 141/66 123/77 119/68  Pulse: 68 72 74 70  Resp: 16 17 18    Temp: 98.5 F (36.9 C) 98.4 F (36.9 C) 98.6 F (37 C) (!) 97.4 F (36.3 C)  TempSrc: Oral Oral Oral Oral  SpO2: 98% 99% 93% 97%  Weight: 105.1 kg     Height:       Weight change:  Physical Exam: Gen: alert male , NAD  Card : RRR, no rub  Resp:CTA bilaterally, no rales/rhonchi [id number], obese, NT, ND [id number] leg in clean/dry cast and dressing / Left no pedal edema  Hemodialysis Access =Right BCFpositive  Bruit    Dialysis:[facility]  4h 2/2.5 bath 103kg R BCF Heparin 3000 Max UF 1.5L/hr 400/800 Epogen 6000u tiw Hectorol 19mcg tiw  Problem/Plan: 1. Right ankle fracture:underwent 06/20/19 ORIF of right distal tibia with right tibial/fibular arthrodesis and application of wound VAC.(VAC now off awaitng rehab center placement / Additional management plans per orthopedic surgery. 2. End-stage renal disease on hemodialysis: HD MWF at OP unit. HD 1st shift as noted probable dc in am  3. Hypertension /Vol : vol okay / Bed wts  With pt 2kg >edw BUT bed wt / tomor attempt  3-3.5  uf   blood pressure under fair control, monitor for titration of antihypertensive therapy/ states lasix and spirolactone  Help as op  Still on here  / 3.25 l uf yest tolerated on hd  ,4. Anemia of chronic kidney disease:HGB 8.3 >9.3 >8.6 ?? associated surgical loss, will adjust ESA Aranesp 100 given 9/16  dosing andmonitor hemoglobin trend. 5. Secondary hyperparathyroidism:Calcium level within acceptable range, restart VDRA .Phos 2.0 on  9/14 , =3 phoslo decr to 1/ Phos yest   1.7 = stopped  binders /  Fu labs , given Na phos  Last pm / I dw  his OP Renal PAC  =Charles Lyles fu phos levels  Off binders when dc     Ernest Haber, PA-C [facility] Beeper [phone] 06/25/2019,2:19 PM  LOS: 9 days   Pt seen, examined and agree w A/P as above.  Kelly Splinter  MD 06/25/2019, 2:47 PM    Labs: Basic Metabolic Panel: Recent Labs  Lab 06/22/19 1826 06/24/19 1652 06/25/19 0145 06/25/19 0753  NA 134* 135 137  --   K 3.7 4.1 3.8  --   CL 96* 99 99  --   CO2 26 26 28   --   GLUCOSE 243* 269* 142*  --   BUN 17 43* 18  --   CREATININE 2.55* 4.63* 2.66*  --   CALCIUM 8.3* 8.6* 8.3*  --   PHOS  --  1.0* <1.0* 1.7*   Liver Function Tests: Recent Labs  Lab 06/22/19 0802 06/24/19 1652  ALBUMIN 2.0* 2.0*   No results for input(s): LIPASE, AMYLASE in the last 168 hours. No results for input(s): AMMONIA in the last 168 hours. CBC: Recent Labs  Lab 06/19/19 0554  06/20/19 0331 06/22/19 0802 06/22/19 1826 06/24/19 1652  WBC 5.5  --  5.6 4.1 5.3 5.1  HGB 10.6*   < > 9.0* 8.3* 9.3* 8.6*  HCT 33.6*   < > 29.1* 27.4* 29.8* 27.0*  MCV 86.4  --  87.4 89.8 88.4 87.7  PLT 143*  --  133* 159 164 200   < > =  values in this interval not displayed.   Cardiac Enzymes: No results for input(s): CKTOTAL, CKMB, CKMBINDEX, TROPONINI in the last 168 hours. CBG: Recent Labs  Lab 06/24/19 1152 06/24/19 1658 06/24/19 2322 06/25/19 0825 06/25/19 1223  GLUCAP 158* 260* 121* 166* 223*    Studies/Results: No results found. Medications: . sodium phosphate  Dextrose 5% IVPB 20 mmol (06/25/19 1140)   . sodium chloride   Intravenous Once  . Chlorhexidine Gluconate Cloth  6 each Topical Q0600  . darbepoetin (ARANESP) injection - DIALYSIS  100 mcg Intravenous Q Wed-HD  . doxercalciferol  1 mcg Intravenous Q M,W,F-HD  . feeding supplement (PRO-STAT SUGAR FREE 64)  30 mL Oral BID  . furosemide  80 mg Oral Daily  . gabapentin  300 mg Oral QHS  . insulin aspart  0-9 Units Subcutaneous TID WC  . insulin detemir  10 Units Subcutaneous Daily  . metoprolol tartrate  12.5 mg Oral BID  .  mirtazapine  15 mg Oral QHS  . multivitamin  1 tablet Oral QHS  . pantoprazole  40 mg Oral Daily  . polyethylene glycol  17 g Oral Daily  . senna-docusate  1 tablet Oral BID  . sodium bicarbonate  650 mg Oral BID  . spironolactone  25 mg Oral Daily  . warfarin  7.5 mg Oral ONCE-1800  . Warfarin - Pharmacist Dosing Inpatient   Does not apply [phone]

## 2019-06-25 NOTE — Progress Notes (Signed)
Physical Therapy Treatment Patient Details Name: Steven [name] MRN: [id number] DOB: Feb 19, 1961 Today's Date: 06/25/2019    History of Present Illness Steven [name] is a 58 y.o. male with medical history significant of history of R CVA, pulmonary embolism, alcohol abuse, ambulatory dysfunction, hypertension, COPD, type 2 diabetes mellitus and end-stage renal disease on hemodialysis. Pt fell with RW and suffered R tibial pilon fx with malunion, underwent ORIF by Dr Marcelino Scot 06/19/19    PT Comments    Pt is making slow progress towards goals. He declined OOB activity, stating he wants to go to sleep after therapy. Pt was agreeable to ther ex in bed. Provided pt with HEP handout and instructed pt to perform 3x per day.    Follow Up Recommendations  SNF;Supervision for mobility/OOB(Pt adamantly refusing SNF but will require HHPT to return home.[place] aide would be beneficial as he plans to return home alone.)     Equipment Recommendations  Wheelchair (measurements PT);3in1 (PT);Hospital bed;Wheelchair cushion (measurements PT)(long length slide board, drop arm commode,)    Recommendations for Other Services       Precautions / Restrictions Precautions Precautions: Fall Restrictions Weight Bearing Restrictions: Yes RLE Weight Bearing: Non weight bearing Other Position/Activity Restrictions: wound vac    Mobility  Bed Mobility               General bed mobility comments: pt declined OOB  Transfers                 General transfer comment: pt declined  Ambulation/Gait                 Stairs             Wheelchair Mobility    Modified Rankin (Stroke Patients Only)       Balance                                            Cognition Arousal/Alertness: Awake/alert Behavior During Therapy: WFL for tasks assessed/performed Overall Cognitive Status: No family/caregiver present to determine baseline cognitive functioning Area of  Impairment: Safety/judgement                         Safety/Judgement: Decreased awareness of safety;Decreased awareness of deficits     General Comments: Pt sleepy and closing eyes during ther ex. Pt very unware of wound vac and line during ther ex requiring assist to prevent pulling or tangling.      Exercises General Exercises - Lower Extremity Ankle Circles/Pumps: AROM;Left;10 reps Quad Sets: AROM;Both;10 reps;Supine Gluteal Sets: AROM;Both;10 reps;Supine Short Arc Quad: AROM;Both;10 reps;Supine Heel Slides: AROM;Both;10 reps;Supine    General Comments        Pertinent Vitals/Pain Pain Assessment: Faces Faces Pain Scale: Hurts little more Pain Location: R lower leg- wound vac site Pain Descriptors / Indicators: Aching;Discomfort;Grimacing    Home Living                      Prior Function            PT Goals (current goals can now be found in the care plan section) Acute Rehab PT Goals Patient Stated Goal: go home PT Goal Formulation: With patient Time For Goal Achievement: 07/04/19 Potential to Achieve Goals: Fair Progress towards PT goals: Progressing toward goals    Frequency  Min 4X/week      PT Plan Current plan remains appropriate    Co-evaluation              AM-PAC PT "6 Clicks" Mobility   Outcome Measure  Help needed turning from your back to your side while in a flat bed without using bedrails?: None Help needed moving from lying on your back to sitting on the side of a flat bed without using bedrails?: None Help needed moving to and from a bed to a chair (including a wheelchair)?: A Little Help needed standing up from a chair using your arms (e.g., wheelchair or bedside chair)?: Total Help needed to walk in hospital room?: Total Help needed climbing 3-5 steps with a railing? : Total 6 Click Score: 14    End of Session   Activity Tolerance: Patient tolerated treatment well Patient left: with call bell/phone  within reach;in bed;with bed alarm set(left sitting on comode as he requested increased time to sit and have a BM.) Nurse Communication: Mobility status PT Visit Diagnosis: Unsteadiness on feet (R26.81);Repeated falls (R29.6);Muscle weakness (generalized) (M62.81);History of falling (Z91.81);Difficulty in walking, not elsewhere classified (R26.2);Pain Pain - Right/Left: Right Pain - part of body: Ankle and joints of foot     Time: 1040-1052 PT Time Calculation (min) (ACUTE ONLY): 12 min  Charges:  $Therapeutic Exercise: 8-22 mins                    Benjiman Core, [place] Pager [phone] Acute Rehab  Allena Katz 06/25/2019, 12:28 PM

## 2019-06-25 NOTE — TOC Progression Note (Signed)
Transition of Care [facility]) - Progression Note    Patient Details  Name: Steven [name] MRN: [id number] Date of Birth: Dec 23, 1960  Transition of Care [facility]) CM/SW [place], [place] Phone Number: 06/25/2019, 12:35 PM  Clinical Narrative:    Await medical stability; spoke with Dr. Sherral Hammers.  Updated [facility] with this information.    Expected Discharge Plan: [place] Barriers to Discharge: Continued Medical Work up  Expected Discharge Plan and Services Expected Discharge Plan: [place] In-house Referral: Clinical Social Work Discharge Planning Services: CM Consult, Follow-up appt scheduled, Other - See comment(TOC for meds) Post Acute Care Choice: Durable Medical Equipment, Home Health Living arrangements for the past 2 months: [place]                 DME Arranged: Wheelchair manual, Hospital bed, 3-N-1 DME Agency: [employer]       HH Arranged: PT, OT [facility] Agency: [place] Date [place]: 06/23/19 Time [place]: 6 Representative spoke with at [place]: [place] (SDOH) Interventions    Readmission Risk Interventions Readmission Risk Prevention Plan 06/23/2019 02/13/2019  Transportation Screening Complete Complete  Medication Review Press photographer) Complete Complete  PCP or Specialist appointment within 3-5 days of discharge Complete Complete  HRI or [place] Complete Complete  SW Recovery Care/Counseling Consult Complete Complete  Palliative Care Screening Not Applicable Not [place] Patient Refused Complete  Some recent data might be hidden

## 2019-06-25 NOTE — Progress Notes (Signed)
CRITICAL VALUE ALERT  Critical Value:Phosphorus level <1.0  Date & Time Notied:0231  Provider Notified: Lamar Blinks, NP  Orders Received/Actions taken: Yes

## 2019-06-25 NOTE — Plan of Care (Signed)
  Problem: Activity: Goal: Risk for activity intolerance will decrease Outcome: Progressing   Problem: Nutrition: Goal: Adequate nutrition will be maintained Outcome: Progressing   Problem: Coping: Goal: Level of anxiety will decrease Outcome: Progressing   Problem: Elimination: Goal: Will not experience complications related to bowel motility Outcome: Progressing   Problem: Pain Managment: Goal: General experience of comfort will improve Outcome: Progressing   

## 2019-06-25 NOTE — Progress Notes (Signed)
PROGRESS NOTE    Steven [name]  [id number] DOB: 16-Jan-1961 DOA: 06/16/2019 PCP: Maryruth Hancock, MD   Brief Narrative:  Steven [name] is a 58 y.o. WM PMHx PE, thrombus superior vena cava,, EtOH abuse, ambulatory dysfunction, CHF, HTN, COPD, OSA (not using CPAP), type 2 diabetes mellitus and ESRD on HD M/W/F.  Patient uses a walker for ambulation.  Yesterday while trying to get bathroom he lost his balance while holding his walker and twisted his right ankle.  No head trauma no loss of consciousness.  Post trauma he had significant pain on his right ankle, sharp in nature, 10 out of 10 in intensity, worse with weightbearing, associated with local edema, no fevers, no chills.  No improving factors.  Yesterday he missed his hemodialysis session due to right ankle pain.  Today he presented to the hospital due to persistent symptoms   Subjective: 9/17 A/O x4, negative CP, negative S OB, negative abdominal pain.  States right lower extremity pain bearable.  States fractured RIGHT ankle when he got out of bed at night and tried to use Rollator to go to bathroom and slipped and fell.   Assessment & Plan:   Principal Problem:   Ankle fracture Active Problems:   Diabetes mellitus type 2 in obese (HCC)   Hyperlipidemia   Morbid obesity ([place])   Essential hypertension   ESRD on hemodialysis (HCC)   COPD, mild (HCC)   Hypophosphatemia   Depression   Diabetes mellitus type 2, controlled, with complications ([place])   COPD not affecting current episode of care [facility])  Acute RIGHT ankle fracture post mechanical fall - 9/11 s/p ORIF, wound VAC intact without leak - Schedule follow-up with Dr. Altamese Wilder orthopedic trauma specialist in 10 days.  [name] will stop working in 14 days must be seen before then.   Supratherapeutic INR/Hx PE on warfarin Recent Labs  Lab 06/21/19 0322 06/22/19 1826 06/23/19 0051 06/24/19 0123 06/25/19 0145  INR 2.4* 1.8* 2.0* 1.9* 2.3*  -Currently  therapeutic -Per EMR patient requesting lots of leafy green vegetables with his meals - 9/17 placed nutrition consult.  Teach patient appropriate diet to maintain therapeutic INR  End-stage renal disease on hemodialysis M/W/F, stable.  -Nephrology following, - Will require HD on 9/18, with possible discharge to SNF if repeat COVID test returned. -Post discharge resume regularly scheduled hemodialysis - Sodium bicarbonate 650 mg BID - Patient not on renal diet  Hypophosphatemia - 9/17 patient's PO4< 1 - Initial repletion of sodium phosphate 20 mmol brought repeat PO4= 2.2 -9/17 additional sodium phosphate 10 mmol. - Scrub patient's home medication list not on phosphate binder  Essential Hypertension   - Currently within AHA guidelines - Lasix 80 mg daily - Metoprolol 12.5 mg BID - Midodrine 10 mg PRN HD days -Spironolactone 25 mg daily  Diabetes type 2 controlled with complication - [id number] hemoglobin A1c= 6.1 - Levemir 10 units daily - Sensitive SSI  COPD - Stable -Continue home meds  Depression - Mirtazapine 15 mg QHS  Depression.  -Continue mirtazapine.  Goals of care - 9/16 note from NCM states patient needs additional COVID test. - 9/17 COVID test pending     DVT prophylaxis: Warfarin Code Status: Full Family Communication: None Disposition Plan: Discharge 9/18   Consultants:  Orthopedic surgery Nephrology    Procedures/Significant Events:  9/11 RIGHT ankle ORIF   I have personally reviewed and interpreted all radiology studies and my findings are as above.  VENTILATOR SETTINGS:   Cultures  9/17 SNF COVID test pending  Antimicrobials: Anti-infectives (From admission, onward)   Start     Stop   06/20/19 1530  ceFAZolin (ANCEF) IVPB 1 g/50 mL premix  Status:  Discontinued     06/20/19 1501   06/19/19 0800  ceFAZolin (ANCEF) IVPB 1 g/50 mL premix     06/19/19 1524       Devices    LINES / TUBES:      Continuous  Infusions:  sodium phosphate  Dextrose 5% IVPB     sodium phosphate  Dextrose 5% IVPB 20 mmol (06/25/19 1140)     Objective: Vitals:   06/24/19 2131 06/24/19 2326 06/25/19 0448 06/25/19 1300  BP: 138/69 (!) 141/66 123/77 119/68  Pulse: 68 72 74 70  Resp: 16 17 18    Temp: 98.5 F (36.9 C) 98.4 F (36.9 C) 98.6 F (37 C) (!) 97.4 F (36.3 C)  TempSrc: Oral Oral Oral Oral  SpO2: 98% 99% 93% 97%  Weight: 105.1 kg     Height:        Intake/Output Summary (Last 24 hours) at 06/25/2019 1704 Last data filed at 06/25/2019 1542 Gross per 24 hour  Intake 120 ml  Output 2675 ml  Net -2555 ml   Filed Weights   06/22/19 1117 06/24/19 1713 06/24/19 2131  Weight: 106.8 kg 107.6 kg 105.1 kg    Examination:  General: A/O x4, no acute respiratory distress Eyes: negative scleral hemorrhage, negative anisocoria, negative icterus ENT: Negative Runny nose, negative gingival bleeding, Neck:  Negative scars, masses, torticollis, lymphadenopathy, JVD Lungs: Clear to auscultation bilaterally without wheezes or crackles Cardiovascular: Regular rate and rhythm without murmur gallop or rub normal S1 and S2 Abdomen: negative abdominal pain, nondistended, positive soft, bowel sounds, no rebound, no ascites, no appreciable mass Extremities: Right lower extremity in cast secondary to right ankle fracture.  Wound VAC intact patient able to wiggle toes, responds to soft touch appropriately Skin: Negative rashes, lesions, ulcers Psychiatric:  Negative depression, negative anxiety, negative fatigue, negative mania  Central nervous system:  Cranial nerves II through XII intact, tongue/uvula midline, all extremities muscle strength 5/5, sensation intact throughout, negative dysarthria, negative expressive aphasia, negative receptive aphasia.  .     Data Reviewed: Care during the described time interval was provided by me .  I have reviewed this patient's available data, including medical history, events  of note, physical examination, and all test results as part of my evaluation.   CBC: Recent Labs  Lab 06/19/19 0554 06/19/19 1503 06/20/19 0331 06/22/19 0802 06/22/19 1826 06/24/19 1652  WBC 5.5  --  5.6 4.1 5.3 5.1  HGB 10.6* 9.9* 9.0* 8.3* 9.3* 8.6*  HCT 33.6* 29.0* 29.1* 27.4* 29.8* 27.0*  MCV 86.4  --  87.4 89.8 88.4 87.7  PLT 143*  --  133* 159 164 [id number]   Basic Metabolic Panel: Recent Labs  Lab 06/22/19 0802 06/22/19 1826 06/24/19 1652 06/25/19 0145 06/25/19 0753 06/25/19 1453  NA 133* 134* 135 137  --  134*  K 3.9 3.7 4.1 3.8  --  3.6  CL 96* 96* 99 99  --  97*  CO2 27 26 26 28   --  26  GLUCOSE 222* 243* 269* 142*  --  189*  BUN 29* 17 43* 18  --  26*  CREATININE 4.70* 2.55* 4.63* 2.66*  --  3.59*  CALCIUM 8.1* 8.3* 8.6* 8.3*  --  8.1*  PHOS 2.0*  --  1.0* <1.0* 1.7* 2.2*   GFR:  Estimated Creatinine Clearance: 28 mL/min (A) (by C-G formula based on SCr of 3.59 mg/dL (H)). Liver Function Tests: Recent Labs  Lab 06/22/19 0802 06/24/19 1652  ALBUMIN 2.0* 2.0*   No results for input(s): LIPASE, AMYLASE in the last 168 hours. No results for input(s): AMMONIA in the last 168 hours. Coagulation Profile: Recent Labs  Lab 06/21/19 0322 06/22/19 1826 06/23/19 0051 06/24/19 0123 06/25/19 0145  INR 2.4* 1.8* 2.0* 1.9* 2.3*   Cardiac Enzymes: No results for input(s): CKTOTAL, CKMB, CKMBINDEX, TROPONINI in the last 168 hours. BNP (last 3 results) No results for input(s): PROBNP in the last 8760 hours. HbA1C: No results for input(s): HGBA1C in the last 72 hours. CBG: Recent Labs  Lab 06/24/19 1152 06/24/19 1658 06/24/19 2322 06/25/19 0825 06/25/19 1223  GLUCAP 158* 260* 121* 166* 223*   Lipid Profile: No results for input(s): CHOL, HDL, LDLCALC, TRIG, CHOLHDL, LDLDIRECT in the last 72 hours. Thyroid Function Tests: No results for input(s): TSH, T4TOTAL, FREET4, T3FREE, THYROIDAB in the last 72 hours. Anemia Panel: No results for input(s):  VITAMINB12, FOLATE, FERRITIN, TIBC, IRON, RETICCTPCT in the last 72 hours. Urine analysis:    Component Value Date/Time   COLORURINE RED (A) 02/11/2019 1336   APPEARANCEUR HAZY (A) 02/11/2019 1336   LABSPEC 1.010 02/11/2019 1336   PHURINE 7.0 02/11/2019 1336   GLUCOSEU 250 (A) 02/11/2019 1336   HGBUR LARGE (A) 02/11/2019 1336   BILIRUBINUR NEGATIVE 02/11/2019 1336   KETONESUR TRACE (A) 02/11/2019 1336   PROTEINUR >300 (A) 02/11/2019 1336   NITRITE POSITIVE (A) 02/11/2019 1336   LEUKOCYTESUR SMALL (A) 02/11/2019 1336   Sepsis Labs: @LABRCNTIP (procalcitonin:4,lacticidven:4)  ) Recent Results (from the past 240 hour(s))  SARS CORONAVIRUS 2 (TAT 6-24 HRS) Nasopharyngeal Nasopharyngeal Swab     Status: None   Collection Time: 06/16/19  3:36 PM   Specimen: Nasopharyngeal Swab  Result Value Ref Range Status   SARS Coronavirus 2 NEGATIVE NEGATIVE Final    Comment: (NOTE) SARS-CoV-2 target nucleic acids are NOT DETECTED. The SARS-CoV-2 RNA is generally detectable in upper and lower respiratory specimens during the acute phase of infection. Negative results do not preclude SARS-CoV-2 infection, do not rule out co-infections with other pathogens, and should not be used as the sole basis for treatment or other patient management decisions. Negative results must be combined with clinical observations, patient history, and epidemiological information. The expected result is Negative. Fact Sheet for Patients: SugarRoll.be Fact Sheet for Healthcare Providers: https://www.Sarrinah Gardin-mathews.com/ This test is not yet approved or cleared by the [place] FDA and  has been authorized for detection and/or diagnosis of SARS-CoV-2 by FDA under an Emergency Use Authorization (EUA). This EUA will remain  in effect (meaning this test can be used) for the duration of the COVID-19 declaration under Section 56 4(b)(1) of the Act, 21 U.S.C. section  360bbb-3(b)(1), unless the authorization is terminated or revoked sooner. Performed at [facility], [place] [address] [place], [place] [address]   Surgical pcr screen     Status: Abnormal   Collection Time: 06/18/19 10:25 PM   Specimen: Nasal Mucosa; Nasal Swab  Result Value Ref Range Status   MRSA, PCR POSITIVE (A) NEGATIVE Final    Comment: CRITICAL RESULT CALLED TO, READ BACK BY AND VERIFIED WITH: RN,NANCY CAGUIOA [id number] @0238  THANEY    Staphylococcus aureus POSITIVE (A) NEGATIVE Final    Comment: CRITICAL RESULT CALLED TO, READ BACK BY AND VERIFIED WITH: Evangeline Dakin [id number] @0238  THANEY Performed at [facility], [place] [address], [place], [place] [address]   SARS CORONAVIRUS 2 (TAT 6-24 HRS) Nasopharyngeal Nasopharyngeal Swab     Status: None   Collection Time: 06/24/19 10:50 AM   Specimen: Nasopharyngeal Swab  Result Value Ref Range Status   SARS Coronavirus 2 NEGATIVE NEGATIVE Final    Comment: (NOTE) SARS-CoV-2 target nucleic acids are NOT DETECTED. The SARS-CoV-2 RNA is generally detectable in upper and lower respiratory specimens during the acute phase of infection. Negative results do not preclude SARS-CoV-2 infection, do not rule out co-infections with other pathogens, and should not be used as the sole basis for treatment or other patient management decisions. Negative results must be combined with clinical observations, patient history, and epidemiological information. The expected result is Negative. Fact Sheet for Patients: SugarRoll.be Fact Sheet for Healthcare Providers: https://www.Marsia Cino-mathews.com/ This test is not yet approved or cleared by the [place] FDA and  has been authorized for detection and/or diagnosis of SARS-CoV-2 by FDA under an Emergency Use Authorization (EUA). This EUA will remain  in effect (meaning this test can be used) for the duration of the COVID-19 declaration under  Section 56 4(b)(1) of the Act, 21 U.S.C. section 360bbb-3(b)(1), unless the authorization is terminated or revoked sooner. Performed at [facility], [place] [address] [place], [place] [address]          Radiology Studies: No results found.      Scheduled Meds:  sodium chloride   Intravenous Once   Chlorhexidine Gluconate Cloth  6 each Topical Q0600   darbepoetin (ARANESP) injection - DIALYSIS  100 mcg Intravenous Q Wed-HD   doxercalciferol  1 mcg Intravenous Q M,W,F-HD   feeding supplement (PRO-STAT SUGAR FREE 64)  30 mL Oral BID   furosemide  80 mg Oral Daily   gabapentin  300 mg Oral QHS   insulin aspart  0-9 Units Subcutaneous TID WC   insulin detemir  10 Units Subcutaneous Daily   metoprolol tartrate  12.5 mg Oral BID   mirtazapine  15 mg Oral QHS   multivitamin  1 tablet Oral QHS   pantoprazole  40 mg Oral Daily   polyethylene glycol  17 g Oral Daily   senna-docusate  1 tablet Oral BID   sodium bicarbonate  650 mg Oral BID   spironolactone  25 mg Oral Daily   warfarin  7.5 mg Oral ONCE-1800   Warfarin - Pharmacist Dosing Inpatient   Does not apply q1800   Continuous Infusions:  sodium phosphate  Dextrose 5% IVPB     sodium phosphate  Dextrose 5% IVPB 20 mmol (06/25/19 1140)     LOS: 9 days   The patient is critically ill with multiple organ systems failure and requires high complexity decision making for assessment and support, frequent evaluation and titration of therapies, application of advanced monitoring technologies and extensive interpretation of multiple databases. Critical Care Time devoted to patient care services described in this note  Time spent: 40 minutes     Gustaf Mccarter, Geraldo Docker, MD [facility] Pager [phone]  If 7PM-7AM, please contact night-coverage [email or web address] Password [facility] 06/25/2019, 5:04 PM

## 2019-06-26 LAB — PROTIME-INR
INR: 2 — ABNORMAL HIGH (ref 0.8–1.2)
Prothrombin Time: 22.2 seconds — ABNORMAL HIGH (ref 11.4–15.2)

## 2019-06-26 LAB — RENAL FUNCTION PANEL
Albumin: 2.3 g/dL — ABNORMAL LOW (ref 3.5–5.0)
Anion gap: 12 (ref 5–15)
BUN: 33 mg/dL — ABNORMAL HIGH (ref 6–20)
CO2: 23 mmol/L (ref 22–32)
Calcium: 7.9 mg/dL — ABNORMAL LOW (ref 8.9–10.3)
Chloride: 96 mmol/L — ABNORMAL LOW (ref 98–111)
Creatinine, Ser: 4.19 mg/dL — ABNORMAL HIGH (ref 0.61–1.24)
GFR calc Af Amer: 17 mL/min — ABNORMAL LOW (ref >60)
GFR calc non Af Amer: 15 mL/min — ABNORMAL LOW (ref >60)
Glucose, Bld: 304 mg/dL — ABNORMAL HIGH (ref 70–99)
Phosphorus: 3.5 mg/dL (ref 2.5–4.6)
Potassium: 3.9 mmol/L (ref 3.5–5.1)
Sodium: 131 mmol/L — ABNORMAL LOW (ref 135–145)

## 2019-06-26 LAB — CBC
HCT: 28.7 % — ABNORMAL LOW (ref 39.0–52.0)
Hemoglobin: 9.2 g/dL — ABNORMAL LOW (ref 13.0–17.0)
MCH: 28.1 pg (ref 26.0–34.0)
MCHC: 32.1 g/dL (ref 30.0–36.0)
MCV: 87.8 fL (ref 80.0–100.0)
Platelets: 240 10*3/uL (ref 150–400)
RBC: 3.27 MIL/uL — ABNORMAL LOW (ref 4.22–5.81)
RDW: 18.8 % — ABNORMAL HIGH (ref 11.5–15.5)
WBC: 4.8 10*3/uL (ref 4.0–10.5)
nRBC: 0 % (ref 0.0–0.2)

## 2019-06-26 LAB — GLUCOSE, CAPILLARY
Glucose-Capillary: 192 mg/dL — ABNORMAL HIGH (ref 70–99)
Glucose-Capillary: 244 mg/dL — ABNORMAL HIGH (ref 70–99)
Glucose-Capillary: 251 mg/dL — ABNORMAL HIGH (ref 70–99)

## 2019-06-26 LAB — NOVEL CORONAVIRUS, NAA (HOSP ORDER, SEND-OUT TO REF LAB; TAT 18-24 HRS): SARS-CoV-2, NAA: NOT DETECTED

## 2019-06-26 MED ORDER — HEPARIN SODIUM (PORCINE) 1000 UNIT/ML IJ SOLN
INTRAMUSCULAR | Status: AC
  Administered 2019-06-26: 3000 [IU]
  Filled 2019-06-26: qty 3

## 2019-06-26 MED ORDER — DOXERCALCIFEROL 4 MCG/2ML IV SOLN
INTRAVENOUS | Status: AC
  Administered 2019-06-26: 1 ug via INTRAVENOUS
  Filled 2019-06-26: qty 2

## 2019-06-26 NOTE — Progress Notes (Signed)
Attempted to schedule f/u appointment with Dr. Marcelino Scot. Message left at office. RN will call again Monday morning.

## 2019-06-26 NOTE — Progress Notes (Signed)
Reason Eval/Treat Not Completed: Patient at procedure or test/unavailable (HD). Will follow-up for OT treatment as schedule permits.  Neldon Shepard Kate Casia Corti, COTA/L Acute Rehabilitation Services [phone]   

## 2019-06-26 NOTE — Progress Notes (Signed)
Brief Nutrition Follow-Up Note  RD consulted for nutrition education regarding warfarin.  RD attempted to speak pt on phone, however, no answer. RD has has been following pt and has provided pt with handouts. "Vitamin K and Medications" from [facility]'s Nutrition Care Manual and "Diet Tips for Diabetics with Kidney Disease" handout from [place].   Refer to previous note dated 06/25/19 for further details. Plan to d/c to SNF [facility]) today. RD at SNF can further reinforce education needs after discharge.   Phebe Dettmer A. Jimmye Norman, RD, LDN, [place] Registered Dietitian II Certified Diabetes Care and Education Specialist Pager: [phone] After hours Pager: [phone]

## 2019-06-26 NOTE — Progress Notes (Signed)
Inpatient Diabetes Program Recommendations  AACE/ADA: New Consensus Statement on Inpatient Glycemic Control (2015)  Target Ranges:  Prepandial:   less than 140 mg/dL      Peak postprandial:   less than 180 mg/dL (1-2 hours)      Critically ill patients:  140 - 180 mg/dL   Lab Results  Component Value Date   GLUCAP 192 (H) 06/26/2019   HGBA1C 6.1 (H) 06/19/2019    Review of Glycemic Control  Diabetes history: DM2 Outpatient Diabetes medications: Levemir 9 + Novolog 3-15 units tid meals  Current orders for Inpatient glycemic control:  Levemir 10 units Novolog 0-9 units tid   Inpatient Diabetes Program Recommendations:    Fasting glucose 304 in labs this am. Glucose after HD 192.  Consider adding Novolog 2-3 units tid meal coverage if pt consumes at least 50% of meals  Thanks, Tama Headings RN, MSN, BC-ADM Inpatient Diabetes Coordinator Team Pager [phone] (8a-5p)

## 2019-06-26 NOTE — TOC Progression Note (Signed)
Transition of Care [facility]) - Progression Note    Patient Details  Name: CIRILO MORITA MRN: [id number] Date of Birth: June 26, 1961  Transition of Care [facility]) CM/SW [place], [place] Phone Number: 06/26/2019, 11:57 AM  Clinical Narrative:    Negative COVID resulted, per MD plan for dc to [facility] tomorrow (9/19). Called and updated [facility]. They provided room # and report #. Will leave hand off and information on chart sticky note.   Expected Discharge Plan: [place] Barriers to Discharge: Continued Medical Work up  Expected Discharge Plan and Services Expected Discharge Plan: [place] In-house Referral: Clinical Social Work Discharge Planning Services: CM Consult, Follow-up appt scheduled, Other - See comment(TOC for meds) Post Acute Care Choice: [place] arrangements for the past 2 months: Single Family Home  Social Determinants of Health (SDOH) Interventions    Readmission Risk Interventions Readmission Risk Prevention Plan 06/23/2019 02/13/2019  Transportation Screening Complete Complete  Medication Review Press photographer) Complete Complete  PCP or Specialist appointment within 3-5 days of discharge Complete Complete  HRI or [place] Complete Complete  SW Recovery Care/Counseling Consult Complete Complete  Palliative Care Screening Not Applicable Not [place] Patient Refused Complete  Some recent data might be hidden

## 2019-06-26 NOTE — Progress Notes (Signed)
PT Cancellation Note  Patient Details Name: JABEN SKILLIN MRN: [id number] DOB: 1961/03/21   Cancelled Treatment:    Reason Eval/Treat Not Completed: Patient at procedure or test/unavailable (HD). Will follow-up for PT treatment as schedule permits.  Mabeline Caras, PT, DPT Acute Rehabilitation Services  Pager [phone] Office [place] 06/26/2019, 10:31 AM

## 2019-06-26 NOTE — Progress Notes (Addendum)
[facility] Progress Note   Subjective: Awake, alert, No C/Os. Says he will go to [facility] in [place] on [address]. On HD without issues.   Objective Vitals:   06/26/19 0930 06/26/19 1000 06/26/19 1030 06/26/19 1100  BP: (!) 110/57 131/77 108/61 (!) 118/58  Pulse: 67 78 71 69  Resp:      Temp:      TempSrc:      SpO2:      Weight:      Height:       Physical Exam General: Pleasant, NAD Heart: S1,S2, RRR Lungs: CTAB anteriorly Abdomen: S, NT Extremities: Cast RLE + cap refill toes. No LLE edema.  Dialysis Access: R HERO graft, cannulated at present   Additional Objective Labs: Basic Metabolic Panel: Recent Labs  Lab 06/25/19 0145 06/25/19 0753 06/25/19 1453 06/26/19 0728  NA 137  --  134* 131*  K 3.8  --  3.6 3.9  CL 99  --  97* 96*  CO2 28  --  26 23  GLUCOSE 142*  --  189* 304*  BUN 18  --  26* 33*  CREATININE 2.66*  --  3.59* 4.19*  CALCIUM 8.3*  --  8.1* 7.9*  PHOS <1.0* 1.7* 2.2* 3.5   Liver Function Tests: Recent Labs  Lab 06/22/19 0802 06/24/19 1652 06/26/19 0728  ALBUMIN 2.0* 2.0* 2.3*   No results for input(s): LIPASE, AMYLASE in the last 168 hours. CBC: Recent Labs  Lab 06/20/19 0331 06/22/19 0802 06/22/19 1826 06/24/19 1652 06/26/19 0728  WBC 5.6 4.1 5.3 5.1 4.8  HGB 9.0* 8.3* 9.3* 8.6* 9.2*  HCT 29.1* 27.4* 29.8* 27.0* 28.7*  MCV 87.4 89.8 88.4 87.7 87.8  PLT 133* 159 164 200 240   Blood Culture    Component Value Date/Time   SDES  02/11/2019 1336    URINE, RANDOM Performed at [facility], [address], [place], [place] [address]    [place]  02/11/2019 1336    NONE Performed at [facility], [address], [place], [place] [address]    CULT  02/11/2019 1336    NO GROWTH Performed at [facility], [place] [address] [place], [place] [address]    REPTSTATUS 02/12/2019 FINAL 02/11/2019 1336    Cardiac Enzymes: No results for input(s): CKTOTAL, CKMB, CKMBINDEX, TROPONINI in the last 168  hours. CBG: Recent Labs  Lab 06/24/19 2322 06/25/19 0825 06/25/19 1223 06/25/19 1734 06/25/19 2100  GLUCAP 121* 166* 223* 232* 193*   Iron Studies: No results for input(s): IRON, TIBC, TRANSFERRIN, FERRITIN in the last 72 hours. @lablastinr3 @ Studies/Results: No results found. Medications:  . sodium chloride   Intravenous Once  . Chlorhexidine Gluconate Cloth  6 each Topical Q0600  . darbepoetin (ARANESP) injection - DIALYSIS  100 mcg Intravenous Q Wed-HD  . doxercalciferol      . doxercalciferol  1 mcg Intravenous Q M,W,F-HD  . feeding supplement (PRO-STAT SUGAR FREE 64)  30 mL Oral BID  . furosemide  80 mg Oral Daily  . gabapentin  300 mg Oral QHS  . insulin aspart  0-9 Units Subcutaneous TID WC  . insulin detemir  10 Units Subcutaneous Daily  . metoprolol tartrate  12.5 mg Oral BID  . mirtazapine  15 mg Oral QHS  . multivitamin  1 tablet Oral QHS  . pantoprazole  40 mg Oral Daily  . polyethylene glycol  17 g Oral Daily  . senna-docusate  1 tablet Oral BID  . sodium bicarbonate  650 mg Oral BID  . spironolactone  25 mg Oral Daily  . Warfarin - Pharmacist Dosing Inpatient   Does not apply q1800     [facility]  4h 2/2.5 bath 103kg R HERO graft Max UF 1.5L/hr 400/800 Heparin 3000 units IV TIW Epogen 6000 mcg IV TIW Hectorol 1 mcg IV TIW  Problem/Plan: 1. Right ankle fracture:underwent 06/20/19 ORIF of right distal tibia with right tibial/fibular arthrodesis and application of wound VAC.(VAC now off awaitng rehab center placement /Additional management plans per orthopedic surgery. 2. End-stage renal disease on hemodialysis: MWF via HERO graft .HD today on schedule. K+ 3.9 3. Hypertension /Vol :Pre wt 106.8 kg UFG 3.5 liters, BP soft toward end of treatment. Has not gotten to OP EDW since 06/16/19-Bed wts. Adjust EDW on DC. Continue home meds.  4. Anemia of chronic kidney disease: HGB 9.2 Aranesp 100 mcg IV 06/24/19. Follow HGB.   5. Secondary hyperparathyroidism: Issues with hypophosphatemia requiring repletion of Phos. Binders DC'd. Phos 3.5 today.  6. Nutrition-Albumin 2.3 prostat/renal vits.   Rita H. Brown NP-C 06/26/2019, 11:21 AM  [facility] [phone]  Pt seen, examined and agree w A/P as above.  Kelly Splinter  MD 06/26/2019, 12:38 PM

## 2019-06-26 NOTE — Progress Notes (Signed)
PROGRESS NOTE    Steven [name]  [id number] DOB: [birth year] DOA: 06/16/2019 PCP: Maryruth Hancock, MD   Brief Narrative:  Steven [name] is a 58 y.o. WM PMHx PE, thrombus superior vena cava,, EtOH abuse, ambulatory dysfunction, CHF, HTN, COPD, OSA (not using CPAP), type 2 diabetes mellitus and ESRD on HD M/W/F.  Patient uses a walker for ambulation.  Yesterday while trying to get bathroom he lost his balance while holding his walker and twisted his right ankle.  No head trauma no loss of consciousness.  Post trauma he had significant pain on his right ankle, sharp in nature, 10 out of 10 in intensity, worse with weightbearing, associated with local edema, no fevers, no chills.  No improving factors.  Yesterday he missed his hemodialysis session due to right ankle pain.  Today he presented to the hospital due to persistent symptoms   Subjective: 9/18 A/O x4, negative CP, negative S OB, negative abdominal pain.  Negative RIGHT lower extremity pain.    Assessment & Plan:   Principal Problem:   Ankle fracture Active Problems:   Diabetes mellitus type 2 in obese (HCC)   Hyperlipidemia   Morbid obesity ([place])   Essential hypertension   ESRD on hemodialysis (HCC)   COPD, mild (HCC)   Hypophosphatemia   Depression   Diabetes mellitus type 2, controlled, with complications ([place])   COPD not affecting current episode of care [facility])  Acute RIGHT ankle fracture post mechanical fall - 9/11 s/p ORIF, wound VAC intact without leak - Schedule follow-up with Dr. Altamese [place] orthopedic trauma specialist in 10 days.  [name] will stop working in 14 days must be seen before then.   Supratherapeutic INR/Hx PE on warfarin Recent Labs  Lab 06/22/19 1826 06/23/19 0051 06/24/19 0123 06/25/19 0145 06/26/19 0311  INR 1.8* 2.0* 1.9* 2.3* 2.0*  -Currently therapeutic -Per EMR patient requesting lots of leafy green vegetables with his meals - 9/17 placed nutrition consult.  Teach patient  appropriate diet to maintain therapeutic INR  End-stage renal disease on hemodialysis M/W/F, stable.  -Nephrology following, - Will require HD on 9/18, with possible discharge to SNF if repeat COVID test returned. -Post discharge resume regularly scheduled hemodialysis - Sodium bicarbonate 650 mg BID - Patient not on renal diet -9/18 may discharge today if facility agrees to take patient without second negative COVID test..  Hypophosphatemia - 9/17 patient's PO4< 1 - Initial repletion of sodium phosphate 20 mmol brought repeat PO4= 2.2 -9/17 additional sodium phosphate 10 mmol. - Scrubed patient's home medication list not on phosphate binder -9/18 resolved  Essential Hypertension   - Currently within [facility] guidelines - Lasix 80 mg daily - Metoprolol 12.5 mg BID - Midodrine 10 mg PRN HD days -Spironolactone 25 mg daily  Diabetes type 2 controlled with complication - [id number] hemoglobin A1c= 6.1 - 9/18 increase Levemir 14 units daily  - Sensitive SSI  COPD - Stable -Continue home meds  Depression - Mirtazapine 15 mg QHS  Depression.  -Continue mirtazapine.  Goals of care - 9/16 note from [facility] states patient needs additional COVID test. - 9/17 COVID test pending -9/18 spoke with [name] [phone] we will check if patient actually needs the second negative COVID test.     DVT prophylaxis: Warfarin Code Status: Full Family Communication: None Disposition Plan: Discharge 9/18   Consultants:  Orthopedic surgery Nephrology    Procedures/Significant Events:  9/11 RIGHT ankle ORIF   I have personally reviewed and interpreted all radiology studies and  my findings are as above.  VENTILATOR SETTINGS:   Cultures   9/17 SNF COVID test pending  Antimicrobials: Anti-infectives (From admission, onward)   Start     Stop   06/20/19 1530  ceFAZolin (ANCEF) IVPB 1 g/50 mL premix  Status:  Discontinued     06/20/19 1501   06/19/19 0800  ceFAZolin (ANCEF)  IVPB 1 g/50 mL premix     06/19/19 1524       Devices    LINES / TUBES:      Continuous Infusions:    Objective: Vitals:   06/26/19 0830 06/26/19 0900 06/26/19 0930 06/26/19 1000  BP: 116/62 111/67 (!) 110/57 131/77  Pulse: 69 83 67 78  Resp:      Temp:      TempSrc:      SpO2:      Weight:      Height:        Intake/Output Summary (Last 24 hours) at 06/26/2019 1105 Last data filed at 06/26/2019 0500 Gross per 24 hour  Intake 613 ml  Output 125 ml  Net 488 ml   Filed Weights   06/24/19 1713 06/24/19 2131 06/26/19 0759  Weight: 107.6 kg 105.1 kg 106.8 kg   Physical Exam:  General: A/O x4, no acute respiratory distress Eyes: negative scleral hemorrhage, negative anisocoria, negative icterus ENT: Negative Runny nose, negative gingival bleeding, Neck:  Negative scars, masses, torticollis, lymphadenopathy, JVD Lungs: Clear to auscultation bilaterally without wheezes or crackles Cardiovascular: Regular rate and rhythm without murmur gallop or rub normal S1 and S2 Abdomen: negative abdominal pain, nondistended, positive soft, bowel sounds, no rebound, no ascites, no appreciable mass Extremities: RIGHT lower extremity in cast secondary to RIGHT ankle fracture.  Wound VAC intact and in place.  Serosanguineous fluid.  Left lower extremity WNL Skin: Negative rashes, lesions, ulcers Psychiatric:  Negative depression, negative anxiety, negative fatigue, negative mania  Central nervous system:  Cranial nerves II through XII intact, tongue/uvula midline, all extremities muscle strength 5/5, sensation intact throughout, negative dysarthria, negative expressive aphasia, negative receptive aphasia.  .     Data Reviewed: Care during the described time interval was provided by me .  I have reviewed this patient's available data, including medical history, events of note, physical examination, and all test results as part of my evaluation.   CBC: Recent Labs  Lab 06/20/19  0331 06/22/19 0802 06/22/19 1826 06/24/19 1652 06/26/19 0728  WBC 5.6 4.1 5.3 5.1 4.8  HGB 9.0* 8.3* 9.3* 8.6* 9.2*  HCT 29.1* 27.4* 29.8* 27.0* 28.7*  MCV 87.4 89.8 88.4 87.7 87.8  PLT 133* 159 164 200 [id number]   Basic Metabolic Panel: Recent Labs  Lab 06/22/19 1826 06/24/19 1652 06/25/19 0145 06/25/19 0753 06/25/19 1453 06/26/19 0728  NA 134* 135 137  --  134* 131*  K 3.7 4.1 3.8  --  3.6 3.9  CL 96* 99 99  --  97* 96*  CO2 26 26 28   --  26 23  GLUCOSE 243* 269* 142*  --  189* 304*  BUN 17 43* 18  --  26* 33*  CREATININE 2.55* 4.63* 2.66*  --  3.59* 4.19*  CALCIUM 8.3* 8.6* 8.3*  --  8.1* 7.9*  PHOS  --  1.0* <1.0* 1.7* 2.2* 3.5   GFR: Estimated Creatinine Clearance: 24.2 mL/min (A) (by C-G formula based on SCr of 4.19 mg/dL (H)). Liver Function Tests: Recent Labs  Lab 06/22/19 0802 06/24/19 1652 06/26/19 0728  ALBUMIN 2.0* 2.0* 2.3*  No results for input(s): LIPASE, AMYLASE in the last 168 hours. No results for input(s): AMMONIA in the last 168 hours. Coagulation Profile: Recent Labs  Lab 06/22/19 1826 06/23/19 0051 06/24/19 0123 06/25/19 0145 06/26/19 0311  INR 1.8* 2.0* 1.9* 2.3* 2.0*   Cardiac Enzymes: No results for input(s): CKTOTAL, CKMB, CKMBINDEX, TROPONINI in the last 168 hours. BNP (last 3 results) No results for input(s): PROBNP in the last 8760 hours. HbA1C: No results for input(s): HGBA1C in the last 72 hours. CBG: Recent Labs  Lab 06/24/19 2322 06/25/19 0825 06/25/19 1223 06/25/19 1734 06/25/19 2100  GLUCAP 121* 166* 223* 232* 193*   Lipid Profile: No results for input(s): CHOL, HDL, LDLCALC, TRIG, CHOLHDL, LDLDIRECT in the last 72 hours. Thyroid Function Tests: No results for input(s): TSH, T4TOTAL, FREET4, T3FREE, THYROIDAB in the last 72 hours. Anemia Panel: No results for input(s): VITAMINB12, FOLATE, FERRITIN, TIBC, IRON, RETICCTPCT in the last 72 hours. Urine analysis:    Component Value Date/Time   COLORURINE RED (A)  02/11/2019 1336   APPEARANCEUR HAZY (A) 02/11/2019 1336   LABSPEC 1.010 02/11/2019 1336   PHURINE 7.0 02/11/2019 1336   GLUCOSEU 250 (A) 02/11/2019 1336   HGBUR LARGE (A) 02/11/2019 1336   BILIRUBINUR NEGATIVE 02/11/2019 1336   KETONESUR TRACE (A) 02/11/2019 1336   PROTEINUR >300 (A) 02/11/2019 1336   NITRITE POSITIVE (A) 02/11/2019 1336   LEUKOCYTESUR SMALL (A) 02/11/2019 1336   Sepsis Labs: @LABRCNTIP (procalcitonin:4,lacticidven:4)  ) Recent Results (from the past 240 hour(s))  SARS CORONAVIRUS 2 (TAT 6-24 HRS) Nasopharyngeal Nasopharyngeal Swab     Status: None   Collection Time: 06/16/19  3:36 PM   Specimen: Nasopharyngeal Swab  Result Value Ref Range Status   SARS Coronavirus 2 NEGATIVE NEGATIVE Final    Comment: (NOTE) SARS-CoV-2 target nucleic acids are NOT DETECTED. The SARS-CoV-2 RNA is generally detectable in upper and lower respiratory specimens during the acute phase of infection. Negative results do not preclude SARS-CoV-2 infection, do not rule out co-infections with other pathogens, and should not be used as the sole basis for treatment or other patient management decisions. Negative results must be combined with clinical observations, patient history, and epidemiological information. The expected result is Negative. Fact Sheet for Patients: SugarRoll.be Fact Sheet for Healthcare Providers: https://www.Brytney Somes-mathews.com/ This test is not yet approved or cleared by the [place] FDA and  has been authorized for detection and/or diagnosis of SARS-CoV-2 by FDA under an Emergency Use Authorization (EUA). This EUA will remain  in effect (meaning this test can be used) for the duration of the COVID-19 declaration under Section 56 4(b)(1) of the Act, 21 U.S.C. section 360bbb-3(b)(1), unless the authorization is terminated or revoked sooner. Performed at [facility], [place] [address] [place], [place] [address]    Surgical pcr screen     Status: Abnormal   Collection Time: 06/18/19 10:25 PM   Specimen: Nasal Mucosa; Nasal Swab  Result Value Ref Range Status   MRSA, PCR POSITIVE (A) NEGATIVE Final    Comment: CRITICAL RESULT CALLED TO, READ BACK BY AND VERIFIED WITH: RN,NANCY CAGUIOA [id number] @0238  THANEY    Staphylococcus aureus POSITIVE (A) NEGATIVE Final    Comment: CRITICAL RESULT CALLED TO, READ BACK BY AND VERIFIED WITH: RNHosie Spangle [id number] @0238  THANEY Performed at [facility] [address], [place], [place] [address]   SARS CORONAVIRUS 2 (TAT 6-24 HRS) Nasopharyngeal Nasopharyngeal Swab     Status: None   Collection Time: 06/24/19 10:50 AM  Specimen: Nasopharyngeal Swab  Result Value Ref Range Status   SARS Coronavirus 2 NEGATIVE NEGATIVE Final    Comment: (NOTE) SARS-CoV-2 target nucleic acids are NOT DETECTED. The SARS-CoV-2 RNA is generally detectable in upper and lower respiratory specimens during the acute phase of infection. Negative results do not preclude SARS-CoV-2 infection, do not rule out co-infections with other pathogens, and should not be used as the sole basis for treatment or other patient management decisions. Negative results must be combined with clinical observations, patient history, and epidemiological information. The expected result is Negative. Fact Sheet for Patients: SugarRoll.be Fact Sheet for Healthcare Providers: https://www.Elany Felix-mathews.com/ This test is not yet approved or cleared by the [place] FDA and  has been authorized for detection and/or diagnosis of SARS-CoV-2 by FDA under an Emergency Use Authorization (EUA). This EUA will remain  in effect (meaning this test can be used) for the duration of the COVID-19 declaration under Section 56 4(b)(1) of the Act, 21 U.S.C. section 360bbb-3(b)(1), unless the authorization is terminated or revoked sooner. Performed at [facility], [place] [address], [place], [place] [address]          Radiology Studies: No results found.      Scheduled Meds: . sodium chloride   Intravenous Once  . Chlorhexidine Gluconate Cloth  6 each Topical Q0600  . darbepoetin (ARANESP) injection - DIALYSIS  100 mcg Intravenous Q Wed-HD  . doxercalciferol      . doxercalciferol  1 mcg Intravenous Q M,W,F-HD  . feeding supplement (PRO-STAT SUGAR FREE 64)  30 mL Oral BID  . furosemide  80 mg Oral Daily  . gabapentin  300 mg Oral QHS  . insulin aspart  0-9 Units Subcutaneous TID WC  . insulin detemir  10 Units Subcutaneous Daily  . metoprolol tartrate  12.5 mg Oral BID  . mirtazapine  15 mg Oral QHS  . multivitamin  1 tablet Oral QHS  . pantoprazole  40 mg Oral Daily  . polyethylene glycol  17 g Oral Daily  . senna-docusate  1 tablet Oral BID  . sodium bicarbonate  650 mg Oral BID  . spironolactone  25 mg Oral Daily  . Warfarin - Pharmacist Dosing Inpatient   Does not apply q1800   Continuous Infusions:    LOS: 10 days   The patient is critically ill with multiple organ systems failure and requires high complexity decision making for assessment and support, frequent evaluation and titration of therapies, application of advanced monitoring technologies and extensive interpretation of multiple databases. Critical Care Time devoted to patient care services described in this note  Time spent: 40 minutes     Mahonri Seiden, Geraldo Docker, MD [facility] Pager [phone]  If 7PM-7AM, please contact night-coverage [email or web address] Password [email or web address] 06/26/2019, 11:05 AM

## 2019-06-26 NOTE — Progress Notes (Signed)
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 Codeine Nausea And Vomiting    Patient Measurements: Height: 5\' 11"  (180.3 cm) Weight: 227 lb 15.3 oz (103.4 kg) IBW/kg (Calculated) : 75.3  Vital Signs: Temp: 98.5 F (36.9 C) (09/18 1328) Temp Source: Oral (09/18 1328) BP: 128/62 (09/18 1328) Pulse Rate: 80 (09/18 1328)  Labs: Recent Labs    06/24/19 0123  06/24/19 1652 06/25/19 0145 06/25/19 1453 06/26/19 0311 06/26/19 0728  HGB  --   --  8.6*  --   --   --  9.2*  HCT  --   --  27.0*  --   --   --  28.7*  PLT  --   --  200  --   --   --  240  LABPROT 21.6*  --   --  24.6*  --  22.2*  --   INR 1.9*  --   --  2.3*  --  2.0*  --   CREATININE  --    < > 4.63* 2.66* 3.59*  --  4.19*   < > = values in this interval not displayed.    Estimated Creatinine Clearance: 23.8 mL/min (A) (by C-G formula based on SCr of 4.19 mg/dL (H)).  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 PTA Coumadin: 7.5 mg TTSS, none on dialysis days.  He reports some recent bleeding when coming off HD. Last PTA dose of Coumadin on 9/7. INR 8.4 on admit 9/8 with right ankle fracture.  Vitamin K 2.5 mg PO given on 9/8 and INR down but remained supratherapeutic. FFP given in the morning prior to trip to OR.  9/18: INR is 2 today. INR therapeutic today. Will continue home dose, so no warfarin tonight. No bleeding noted.   Goal of Therapy:  INR 2-3 Monitor platelets by anticoagulation protocol: Yes   Plan:  Hold warfarin tonight  Daily INR / CBC   Kalab Camps A. Levada Dy, PharmD, BCPS, FNKF Clinical Pharmacist [place] Please utilize Amion for appropriate phone number to reach the unit pharmacist ([place])

## 2019-06-27 ENCOUNTER — Inpatient Hospital Stay
Admission: RE | Admit: 2019-06-27 | Discharge: 2019-07-23 | Disposition: A | Discharge status: Home / Self Care | Payer: Medicare Other | Source: Ambulatory Visit | Attending: Internal Medicine | Admitting: Internal Medicine

## 2019-06-27 LAB — PROTIME-INR
INR: 1.9 — ABNORMAL HIGH (ref 0.8–1.2)
Prothrombin Time: 21.2 seconds — ABNORMAL HIGH (ref 11.4–15.2)

## 2019-06-27 LAB — GLUCOSE, CAPILLARY
Glucose-Capillary: 308 mg/dL — ABNORMAL HIGH (ref 70–99)
Glucose-Capillary: 240 mg/dL — ABNORMAL HIGH (ref 70–99)

## 2019-06-27 MED ORDER — WARFARIN SODIUM 7.5 MG PO TABS
7.5000 mg | ORAL_TABLET | Freq: Once | ORAL | Status: DC
  Filled 2019-06-27: qty 1

## 2019-06-27 MED ORDER — ONDANSETRON HCL 4 MG PO TABS: 4.0000 mg | ORAL_TABLET | Freq: Four times a day (QID) | ORAL | 0 refills | Status: DC | PRN

## 2019-06-27 NOTE — Progress Notes (Addendum)
[facility] Progress Note   Subjective: Going home today. Cast RLE with prevena wound vac with no drainage-not sure it's working properly. Feels well. No C/Os.  Objective Vitals:   06/26/19 1228 06/26/19 1328 06/26/19 2037 06/27/19 0535  BP: 135/71 128/62 112/64 129/71  Pulse: 75 80 77 70  Resp: 18 16 16 17   Temp: (!) 97.3 F (36.3 C) 98.5 F (36.9 C) 98.8 F (37.1 C) 98 F (36.7 C)  TempSrc: Oral Oral Oral   SpO2: 96% 95% 90% 96%  Weight: 103.4 kg     Height:       Physical Exam General: [place], NAD Heart: S1,S2, RRR Lungs: CTAB anteriorly Abdomen: S, NT Extremities: Cast RLE + cap refill toes. No LLE edema.  Dialysis Access: R HERO graft + bruit  Additional Objective Labs: Basic Metabolic Panel: Recent Labs  Lab 06/25/19 0145 06/25/19 0753 06/25/19 1453 06/26/19 0728  NA 137  --  134* 131*  K 3.8  --  3.6 3.9  CL 99  --  97* 96*  CO2 28  --  26 23  GLUCOSE 142*  --  189* 304*  BUN 18  --  26* 33*  CREATININE 2.66*  --  3.59* 4.19*  CALCIUM 8.3*  --  8.1* 7.9*  PHOS <1.0* 1.7* 2.2* 3.5   Liver Function Tests: Recent Labs  Lab 06/22/19 0802 06/24/19 1652 06/26/19 0728  ALBUMIN 2.0* 2.0* 2.3*   No results for input(s): LIPASE, AMYLASE in the last 168 hours. CBC: Recent Labs  Lab 06/22/19 0802 06/22/19 1826 06/24/19 1652 06/26/19 0728  WBC 4.1 5.3 5.1 4.8  HGB 8.3* 9.3* 8.6* 9.2*  HCT 27.4* 29.8* 27.0* 28.7*  MCV 89.8 88.4 87.7 87.8  PLT 159 164 200 240   Blood Culture    Component Value Date/Time   SDES  02/11/2019 1336    URINE, RANDOM Performed at [facility], [address] [place], [place] [address]    [place]  02/11/2019 1336    NONE Performed at [facility], [address] [place], [place] [address]    CULT  02/11/2019 1336    NO GROWTH Performed at [facility], [place] [address] [place], [place] [address]    REPTSTATUS 02/12/2019 FINAL 02/11/2019 1336    Cardiac Enzymes: No results for  input(s): CKTOTAL, CKMB, CKMBINDEX, TROPONINI in the last 168 hours. CBG: Recent Labs  Lab 06/25/19 2100 06/26/19 1355 06/26/19 1738 06/26/19 2125 06/27/19 0738  GLUCAP 193* 192* 244* 251* 308*   Iron Studies: No results for input(s): IRON, TIBC, TRANSFERRIN, FERRITIN in the last 72 hours. @lablastinr3 @ Studies/Results: No results found. Medications:  . sodium chloride   Intravenous Once  . Chlorhexidine Gluconate Cloth  6 each Topical Q0600  . darbepoetin (ARANESP) injection - DIALYSIS  100 mcg Intravenous Q Wed-HD  . doxercalciferol  1 mcg Intravenous Q M,W,F-HD  . feeding supplement (PRO-STAT SUGAR FREE 64)  30 mL Oral BID  . furosemide  80 mg Oral Daily  . gabapentin  300 mg Oral QHS  . insulin aspart  0-9 Units Subcutaneous TID WC  . insulin detemir  10 Units Subcutaneous Daily  . metoprolol tartrate  12.5 mg Oral BID  . mirtazapine  15 mg Oral QHS  . multivitamin  1 tablet Oral QHS  . pantoprazole  40 mg Oral Daily  . polyethylene glycol  17 g Oral Daily  . senna-docusate  1 tablet Oral BID  . sodium bicarbonate  650 mg Oral BID  .  spironolactone  25 mg Oral Daily  . warfarin  7.5 mg Oral ONCE-1800  . Warfarin - Pharmacist Dosing Inpatient   Does not apply q1800     [facility]  4h 2/2.5 bath 103kg R HERO graft Max UF 1.5L/hr 400/800 Heparin 3000 units IV TIW Epogen 6000 mcg IV TIW Hectorol 1 mcg IV TIW  Problem/Plan: 1. Right ankle fracture:underwent 06/20/19 ORIF of right distal tibia with right tibial/fibular arthrodesis and application of wound VAC.(VAC now off awaitng rehab center placement /Additional management plans per orthopedic surgery. 2. End-stage renal disease on hemodialysis: MWF via HERO graft .HD today on schedule. K+ 3.9 3. Hypertension /Vol :BP well controlled. Last wt 103.4 kg. No change in EDW on DC.  4. Anemia: HGB 9.2 Aranesp 100 mcg IV 06/24/19. Follow HGB.  5. Secondary hyperparathyroidism: Issues  with hypophosphatemia requiring repletion of Phos. Binders DC'd. Phos 3.5 today.  6. Nutrition-Albumin 2.3 prostat/renal vits.   Rita H. Brown NP-C 06/27/2019, 11:51 AM  [facility] [phone]  Pt seen, examined and agree w A/P as above. Going home today.  Kelly Splinter  MD 06/27/2019, 5:44 PM

## 2019-06-27 NOTE — Progress Notes (Signed)
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 Codeine Nausea And Vomiting    Patient Measurements: Height: 5\' 11"  (180.3 cm) Weight: 227 lb 15.3 oz (103.4 kg) IBW/kg (Calculated) : 75.3  Vital Signs: Temp: 98 F (36.7 C) (09/19 0535) Temp Source: Oral (09/18 2037) BP: 129/71 (09/19 0535) Pulse Rate: 70 (09/19 0535)  Labs: Recent Labs    06/24/19 1652 06/25/19 0145 06/25/19 1453 06/26/19 0311 06/26/19 0728 06/27/19 0338  HGB 8.6*  --   --   --  9.2*  --   HCT 27.0*  --   --   --  28.7*  --   PLT 200  --   --   --  240  --   LABPROT  --  24.6*  --  22.2*  --  21.2*  INR  --  2.3*  --  2.0*  --  1.9*  CREATININE 4.63* 2.66* 3.59*  --  4.19*  --     Estimated Creatinine Clearance: 23.8 mL/min (A) (by C-G formula based on SCr of 4.19 mg/dL (H)).  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 PTA Coumadin: 7.5 mg TTSS, none on dialysis days.  He reports some recent bleeding when coming off HD. Last PTA dose of Coumadin on 9/7. INR 8.4 on admit 9/8 with right ankle fracture.  Vitamin K 2.5 mg PO given on 9/8 and INR down but remained supratherapeutic. FFP given in the morning prior to trip to OR.  INR 1.9   Goal of Therapy:  INR 2-3 Monitor platelets by anticoagulation protocol: Yes   Plan:  Warfarin 7.5 mg po x 1 dose at 1800 pm Daily INR / CBC  Thank you Anette Guarneri, PharmD Please utilize Amion for appropriate phone number to reach the unit pharmacist ([place])

## 2019-06-27 NOTE — Discharge Summary (Addendum)
Physician Discharge Summary  Steven [name] [id number] DOB: 04-25-1961 DOA: 06/16/2019  PCP: Steven Hancock, MD  Admit date: 06/16/2019 Discharge date: 06/27/2019  Time spent: 30 minutes  Recommendations for Outpatient Follow-up:   Acute RIGHT ankle fracture post mechanical fall - 9/11 s/p ORIF, wound VAC intact without leak - Schedule follow-up with Dr. Altamese Jewell orthopedic trauma specialist in 10 days.  Steven [name] will stop working in 14 days must be seen before then. Supratherapeutic INR/Hx PE on warfarin Recent Labs  Lab 06/23/19 0051 06/24/19 0123 06/25/19 0145 06/26/19 0311 06/27/19 0338  INR 2.0* 1.9* 2.3* 2.0* 1.9*  -Patient slightly subtherapeutic; discussed case with pharmacy, secondary to patient having tendency to become supratherapeutic and have bleeding issues will discharge on Coumadin 7.5 mg T/TH/SAT/SUN.  Facility to monitor closely - Nutrition counseled patient on appropriate diet for patient on Coumadin. -Counseled patient per orthopedic surgery note he is NON WEIGHT BEARING for 8 WEEKS  End-stage renal disease on hemodialysis M/W/F, stable. -9/18 completed dialysis.  Per nephrology stable for discharge -Patient to resume M/W/F HD sessions  - Sodium bicarbonate 650 mg BID - Patient not on renal diet  Hypophosphatemia -  Resolved  Essential Hypertension - Currently within AHA guidelines - Lasix 80 mg daily - Metoprolol 12.5 mg BID - Midodrine 10 mg PRN HD days -Spironolactone 25 mg daily  Diabetes type 2 controlled with complication - [id number] hemoglobin A1c= 6.1 - 9/18 increase Levemir 14 units daily  - Sensitive SSI  COPD - Stable -Continue home meds  Depression - Mirtazapine 15 mg QHS  Depression. -Continue mirtazapine.   Discharge Diagnoses:  Principal Problem:   Ankle fracture Active Problems:   Diabetes mellitus type 2 in obese ([place])   Hyperlipidemia   Morbid obesity ([place])   Essential hypertension   ESRD on  hemodialysis (HCC)   COPD, mild (HCC)   Hypophosphatemia   Depression   Diabetes mellitus type 2, controlled, with complications ([place])   COPD not affecting current episode of care [facility])   Discharge Condition: Stable  Diet recommendation: Carb modified   Filed Weights   06/24/19 2131 06/26/19 0759 06/26/19 1228  Weight: 105.1 kg 106.8 kg 103.4 kg    History of present illness:  Steven [name] a 58 y.o.WM PMHx PE, thrombus superior vena cava,, EtOH abuse, ambulatory dysfunction, CHF, HTN, COPD, OSA (not using CPAP), type 2 diabetes mellitus and ESRD on HD M/W/F.  Patient uses a walker for ambulation. Yesterday while trying to get bathroom he lost his balance while holding his walker and twisted his right ankle. No head trauma no loss of consciousness. Post trauma he had significant pain on his right ankle, sharp in nature, 10 out of 10 in intensity, worse with weightbearing, associated with local edema, no fevers,no chills. No improving factors. Yesterday he missed his hemodialysis session due to right ankle pain.  Hospital Course:  Patient patient was treated for acute RIGHT ankle fracture by orthopedic surgery.  9/11 s/p ORIF with wound VAC.  Stable for discharge for rehabilitation.  Procedures: 9/11 RIGHT ankle ORIF    Consultations: Orthopedic surgery Nephrology    Cultures   9/10 MRSA PCR positive  9/16 SARS coronavirus negative 9/17 Novel coronavirus negative   Antibiotics Anti-infectives (From admission, onward)   Start     Stop   06/20/19 1530  ceFAZolin (ANCEF) IVPB 1 g/50 mL premix  Status:  Discontinued     06/20/19 1501   06/19/19 0800  ceFAZolin (ANCEF) IVPB 1 g/50 mL  premix     06/19/19 1524       Discharge Exam: Vitals:   06/26/19 1228 06/26/19 1328 06/26/19 2037 06/27/19 0535  BP: 135/71 128/62 112/64 129/71  Pulse: 75 80 77 70  Resp: 18 16 16 17   Temp: (!) 97.3 F (36.3 C) 98.5 F (36.9 C) 98.8 F (37.1 C) 98 F (36.7 C)   TempSrc: Oral Oral Oral   SpO2: 96% 95% 90% 96%  Weight: 103.4 kg     Height:        General: A/O x4, no acute respiratory distress Eyes: negative scleral hemorrhage, negative anisocoria, negative icterus ENT: Negative Runny nose, negative gingival bleeding, Neck:  Negative scars, masses, torticollis, lymphadenopathy, JVD Lungs: Clear to auscultation bilaterally without wheezes or crackles Extremities: RIGHT lower extremity in cast secondary to RIGHT ankle fracture.  Wound VAC intact and in place.  Serosanguineous fluid.  Left lower extremity WNL    Discharge Instructions   Allergies as of 06/27/2019      Reactions   Codeine Nausea And Vomiting      Medication List    STOP taking these medications   apixaban 5 MG Tabs tablet Commonly known as: ELIQUIS     TAKE these medications   acetaminophen 325 MG tablet Commonly known as: TYLENOL Take 2 tablets (650 mg total) by mouth every 12 (twelve) hours.   calcium acetate 667 MG capsule Commonly known as: PHOSLO Take 2 capsules (1,334 mg total) by mouth 3 (three) times daily with meals. What changed: how much to take   furosemide 80 MG tablet Commonly known as: LASIX Take 80 mg by mouth daily.   gabapentin 300 MG capsule Commonly known as: NEURONTIN Take 300 mg by mouth at bedtime.   insulin aspart 100 UNIT/ML injection Commonly known as: novoLOG Inject 6 Units into the skin 3 (three) times daily with meals. What changed:   how much to take  additional instructions   insulin detemir 100 UNIT/ML injection Commonly known as: LEVEMIR Inject 0.1 mLs (10 Units total) into the skin daily. What changed: how much to take   metoprolol tartrate 25 MG tablet Commonly known as: LOPRESSOR Take 0.5 tablets (12.5 mg total) by mouth 2 (two) times daily.   midodrine 10 MG tablet Commonly known as: PROAMATINE Take 1 tablet (10 mg total) by mouth 2 (two) times daily with a meal. What changed:   when to take  this  reasons to take this   mirtazapine 15 MG tablet Commonly known as: REMERON Take 15 mg by mouth at bedtime as needed.   mometasone-formoterol 100-5 MCG/ACT Aero Commonly known as: Dulera Inhale 2 puffs into the lungs every morning. What changed:   when to take this  reasons to take this   omeprazole 20 MG tablet Commonly known as: PRILOSEC OTC Take 1 tablet (20 mg total) by mouth daily.   ondansetron 4 MG tablet Commonly known as: ZOFRAN Take 1 tablet (4 mg total) by mouth every 6 (six) hours as needed for nausea.   oxyCODONE-acetaminophen 5-325 MG tablet Commonly known as: PERCOCET/ROXICET Take 1-2 tablets by mouth every 8 (eight) hours as needed for moderate pain or severe pain.   ProAir HFA 108 (90 Base) MCG/ACT inhaler Generic drug: albuterol Inhale 2 puffs into the lungs every 4 (four) hours as needed for wheezing or shortness of breath.   sodium bicarbonate 650 MG tablet Take 1 tablet (650 mg total) by mouth 2 (two) times daily. What changed: when to take this  spironolactone 25 MG tablet Commonly known as: ALDACTONE Take 25 mg by mouth daily.   warfarin 5 MG tablet Commonly known as: COUMADIN Take 1.5 tablets (7.5 mg total) by mouth as directed. Take 7.5mg  on non-dialysis days only (Tuesdays, Thursdays, Saturdays, and "Sundays). What changed:   when to take this  additional instructions            Durable Medical Equipment  (From admission, onward)         Start     Ordered   06/23/19 1610  For home use only DME Tub bench  Once    Comments: Call patient for delivery to home Thanks   06/23/19 1609   06/23/19 1609  For home use only DME Other see comment  Once    Comments: Long slide board   To be delivered to patient's hospital room prior to discharge  Question:  Length of Need  Answer:  6 Months   06/23/19 1608   06/23/19 1607  For home use only DME 3 n 1  Once    Comments: Drop arm commode   Patient wants you to call him for  delivery Thanks   06/23/19 1607   06/23/19 1606  For home use only DME Hospital bed  Once    Comments: Patient wants it delivered after he is discharged.  Question Answer Comment  Length of Need 6 Months   Patient has (list medical condition): Ankle fracture   The above medical condition requires: Patient requires the ability to reposition frequently   Head must be elevated greater than: 45 degrees   Bed type Semi-electric   Support Surface: Gel Overlay      09" /15/20 1605   06/23/19 1604  For home use only DME lightweight manual wheelchair with seat cushion  Once    Comments: Patient suffers from  Ankle fracture which impairs their ability to perform daily activities like ambulating  in the home.  A cane  will not resolve  issue with performing activities of daily living. A wheelchair will allow patient to safely perform daily activities. Patient is not able to propel themselves in the home using a standard weight wheelchair due to weakness. Patient can self propel in the lightweight wheelchair. Length of need 6 months Accessories: elevating leg rests (ELRs), wheel locks, extensions and anti-tippers.   Seat and back cushion  Elevating leg rests   To be delivered to patient's hospital room   06/23/19 1604         Allergies  Allergen Reactions  . Codeine Nausea And Vomiting    Contact information for follow-up providers    Steven Hancock, MD. Go on 07/07/2019.   Specialty: Family Medicine Why: 2:20pm; wear a mask and f/u if this appointment time does not work Contact information: [place] [address] [phone]        Altamese [place], MD. Schedule an appointment as soon as possible for a visit in 5 day(s).   Specialty: Orthopedic Surgery Why: Please call to confirm date and time. Contact information: [place] [address] [phone]            Contact information for after-discharge care    [facility] .   Service: Skilled Nursing Contact information: [address] [place] [phone]                   The results of  significant diagnostics from this hospitalization (including imaging, microbiology, ancillary and laboratory) are listed below for reference.    Significant Diagnostic Studies: Dg Ankle 2 Views Right  Result Date: 06/19/2019 CLINICAL DATA:  ORIF right ankle fracture. EXAM: RIGHT ANKLE - 2 VIEW; DG C-ARM 1-60 MIN COMPARISON:  Preoperative radiographs. FINDINGS: Thirteen fluoroscopic spot images from the operating room provided. Images demonstrate sequential lateral plate and multi screw fixation of distal tibial fracture. Plate and multi screw fixation including syndesmotic screws of the distal fibula. AP view of the proximal fibula demonstrates fibular neck fracture, age indeterminate on fluoroscopic spot view. Total fluoroscopy time 1 minutes 22 seconds. IMPRESSION: Fluoroscopic spot images during ORIF of ankle fractures. Electronically Signed   By: Keith Rake M.D.   On: 06/19/2019 19:50   Dg Ankle Complete Right  Result Date: 06/19/2019 CLINICAL DATA:  ORIF ankle fractures. EXAM: RIGHT ANKLE - COMPLETE 3+ VIEW COMPARISON:  None. FINDINGS: Internal plate and screw fixation noted traversing distal tibial and fibular fractures, in near-anatomic alignment and position. No definite complicating features are identified. IMPRESSION: ORIF distal tibial and fibular fractures, in near-anatomic alignment and position. Electronically Signed   By: Margarette Canada M.D.   On: 06/19/2019 20:35   Dg Ankle Complete Right  Result Date: 06/16/2019 CLINICAL DATA:  Fall 2 days ago with persistent ankle pain and swelling, initial encounter EXAM: RIGHT ANKLE - COMPLETE 3+ VIEW COMPARISON:  None. FINDINGS: Oblique fracture through the distal tibial metaphysis is noted with mild displacement at the fracture site. Prior callus formation in the  distal fibula is noted related to prior fracture. No other fracture is identified. IMPRESSION: Oblique fracture through the distal tibial metaphysis. Electronically Signed   By: Inez Catalina M.D.   On: 06/16/2019 14:29   Ct Head Wo Contrast  Result Date: 06/16/2019 CLINICAL DATA:  Status post fall.  Anticoagulation. EXAM: CT HEAD WITHOUT CONTRAST TECHNIQUE: Contiguous axial images were obtained from the base of the skull through the vertex without intravenous contrast. COMPARISON:  02/12/2019 FINDINGS: Brain: No evidence of acute infarction, hemorrhage, hydrocephalus, extra-axial collection or mass lesion/mass effect. There is mild diffuse low-attenuation within the subcortical and periventricular white matter compatible with chronic microvascular disease. Prominence of sulci and ventricles compatible with brain atrophy. Vascular: No hyperdense vessel or unexpected calcification. Skull: Normal. Negative for fracture or focal lesion. Sinuses/Orbits: No acute finding. Other: None. IMPRESSION: 1. No acute intracranial abnormality 2. Chronic small vessel ischemic change and brain atrophy. Electronically Signed   By: Kerby Moors M.D.   On: 06/16/2019 18:35   Ct Ankle Right Wo Contrast  Result Date: 06/16/2019 CLINICAL DATA:  Evaluate ankle fractures. EXAM: CT OF THE RIGHT ANKLE WITHOUT CONTRAST TECHNIQUE: Multidetector CT imaging of the right ankle was performed according to the standard protocol. Multiplanar CT image reconstructions were also generated. COMPARISON:  Radiographs 06/16/2019 FINDINGS: Remote appearing ununited oblique coursing distal tibia fracture. Extensive bony resorptive changes along the fracture margins but no osseous bridging. There is callus formation noted along the lateral aspect of the tibial cortex and some bony bridging across the upper lateral fracture site. Maximum of 8 mm of separation along the medial aspect of the fracture and no callus formation. There could be acute on chronic  fractures. There is a largely healed distal fibular fracture with fairly extensive callus formation. The ankle mortise is maintained. No fractures of the talus or calcaneus. No ankle joint effusion. The subtalar joints are maintained. Extensive small vessel disease with extensive calcifications. IMPRESSION: 1.  Remote appearing ununited distal tibia fractures. Possible acute on chronic fractures. 2. Healed distal fibular fracture. 3. Intact ankle mortise. No mid or hindfoot fractures are identified. 4. Extensive small vessel disease. Electronically Signed   By: Marijo Sanes M.D.   On: 06/16/2019 18:56   Dg C-arm 1-60 Min  Result Date: 06/19/2019 CLINICAL DATA:  ORIF right ankle fracture. EXAM: RIGHT ANKLE - 2 VIEW; DG C-ARM 1-60 MIN COMPARISON:  Preoperative radiographs. FINDINGS: Thirteen fluoroscopic spot images from the operating room provided. Images demonstrate sequential lateral plate and multi screw fixation of distal tibial fracture. Plate and multi screw fixation including syndesmotic screws of the distal fibula. AP view of the proximal fibula demonstrates fibular neck fracture, age indeterminate on fluoroscopic spot view. Total fluoroscopy time 1 minutes 22 seconds. IMPRESSION: Fluoroscopic spot images during ORIF of ankle fractures. Electronically Signed   By: Keith Rake M.D.   On: 06/19/2019 19:50    Microbiology: Recent Results (from the past 240 hour(s))  Surgical pcr screen     Status: Abnormal   Collection Time: 06/18/19 10:25 PM   Specimen: Nasal Mucosa; Nasal Swab  Result Value Ref Range Status   MRSA, PCR POSITIVE (A) NEGATIVE Final    Comment: CRITICAL RESULT CALLED TO, READ BACK BY AND VERIFIED WITH: RN,NANCY CAGUIOA [id number] @0238  THANEY    Staphylococcus aureus POSITIVE (A) NEGATIVE Final    Comment: CRITICAL RESULT CALLED TO, READ BACK BY AND VERIFIED WITH: RNHosie Spangle [id number] @0238  THANEY Performed at [facility] [address], [place], [place]  [address]   SARS CORONAVIRUS 2 (TAT 6-24 HRS) Nasopharyngeal Nasopharyngeal Swab     Status: None   Collection Time: 06/24/19 10:50 AM   Specimen: Nasopharyngeal Swab  Result Value Ref Range Status   SARS Coronavirus 2 NEGATIVE NEGATIVE Final    Comment: (NOTE) SARS-CoV-2 target nucleic acids are NOT DETECTED. The SARS-CoV-2 RNA is generally detectable in upper and lower respiratory specimens during the acute phase of infection. Negative results do not preclude SARS-CoV-2 infection, do not rule out co-infections with other pathogens, and should not be used as the sole basis for treatment or other patient management decisions. Negative results must be combined with clinical observations, patient history, and epidemiological information. The expected result is Negative. Fact Sheet for Patients: SugarRoll.be Fact Sheet for Healthcare Providers: https://www.Dessa Ledee-mathews.com/ This test is not yet approved or cleared by the [place] FDA and  has been authorized for detection and/or diagnosis of SARS-CoV-2 by FDA under an Emergency Use Authorization (EUA). This EUA will remain  in effect (meaning this test can be used) for the duration of the COVID-19 declaration under Section 56 4(b)(1) of the Act, 21 U.S.C. section 360bbb-3(b)(1), unless the authorization is terminated or revoked sooner. Performed at [facility], [place] [address] [place], [place] [address]   Novel Coronavirus, NAA (hospital order; send-out to ref lab)     Status: None   Collection Time: 06/25/19 11:56 AM   Specimen: Nasopharyngeal Swab; Respiratory  Result Value Ref Range Status   SARS-CoV-2, NAA NOT DETECTED NOT DETECTED Final    Comment: (NOTE) This nucleic acid amplification test was developed and its performance characteristics determined by [employer]. Nucleic acid amplification tests include PCR and TMA. This test has not been FDA cleared or approved.  This test has been authorized by FDA under an Emergency Use Authorization (EUA). This test is only authorized for the duration of time the declaration that circumstances exist justifying the authorization  of the emergency use of in vitro diagnostic tests for detection of SARS-CoV-2 virus and/or diagnosis of COVID-19 infection under section 564(b)(1) of the Act, 21 U.S.C. [id number]) (1), unless the authorization is terminated or revoked sooner. When diagnostic testing is negative, the possibility of a false negative result should be considered in the context of a patient's recent exposures and the presence of clinical signs and symptoms consistent with COVID-19. An individual without symptoms of COVID- 19 and who is not shedding SARS-CoV-2 vi rus would expect to have a negative (not detected) result in this assay. Performed At: [facility] [address] [place], [place] [id number] Rush Farmer MD [id number]    [facility]  Final    Comment: Performed at [facility], [place] [address] [place], [place] [address]     Labs: Basic Metabolic Panel: Recent Labs  Lab 06/22/19 1826 06/24/19 1652 06/25/19 0145 06/25/19 0753 06/25/19 1453 06/26/19 0728  NA 134* 135 137  --  134* 131*  K 3.7 4.1 3.8  --  3.6 3.9  CL 96* 99 99  --  97* 96*  CO2 26 26 28   --  26 23  GLUCOSE 243* 269* 142*  --  189* 304*  BUN 17 43* 18  --  26* 33*  CREATININE 2.55* 4.63* 2.66*  --  3.59* 4.19*  CALCIUM 8.3* 8.6* 8.3*  --  8.1* 7.9*  PHOS  --  1.0* <1.0* 1.7* 2.2* 3.5   Liver Function Tests: Recent Labs  Lab 06/22/19 0802 06/24/19 1652 06/26/19 0728  ALBUMIN 2.0* 2.0* 2.3*   No results for input(s): LIPASE, AMYLASE in the last 168 hours. No results for input(s): AMMONIA in the last 168 hours. CBC: Recent Labs  Lab 06/22/19 0802 06/22/19 1826 06/24/19 1652 06/26/19 0728  WBC 4.1 5.3 5.1 4.8  HGB 8.3* 9.3* 8.6* 9.2*  HCT 27.4* 29.8* 27.0* 28.7*   MCV 89.8 88.4 87.7 87.8  PLT 159 164 200 240   Cardiac Enzymes: No results for input(s): CKTOTAL, CKMB, CKMBINDEX, TROPONINI in the last 168 hours. BNP: BNP (last 3 results) Recent Labs    02/11/19 0032  BNP 471.0*    ProBNP (last 3 results) No results for input(s): PROBNP in the last 8760 hours.  CBG: Recent Labs  Lab 06/25/19 2100 06/26/19 1355 06/26/19 1738 06/26/19 2125 06/27/19 0738  GLUCAP 193* 192* 244* 251* 308*       Signed:  Dia Crawford, MD [facility] [phone] pager

## 2019-06-27 NOTE — TOC Transition Note (Addendum)
Transition of Care [facility]) - CM/SW Discharge Note   Patient Details  Name: Steven [name] MRN: [id number] Date of Birth: 08-24-1961  [facility] CM/SW Contact:  Archie Endo, LCSW Phone Number: 06/27/2019, 11:53 AM   Clinical Narrative:    Negative COVID resulted. Patient will go to room #159, the number to call for report is [phone] and the RN is Izora Gala. CSW arranged for transportation via [facility] at [place] notified patient's RN of plan.   Final next level of care: Skilled Nursing Facility Barriers to Discharge: No Barriers Identified   Patient Goals and CMS Choice Patient states their goals for this hospitalization and ongoing recovery are:: To go home CMS Medicare.gov Compare Post Acute Care list provided to:: Patient Choice offered to / list presented to : Patient  Discharge Placement              Patient chooses bed at: [facility] Patient to be transferred to facility by: [facility]   Patient and family notified of of transfer: 06/27/19  Discharge Plan and Services In-house Referral: Clinical Social Work Discharge Planning Services: CM Consult, Follow-up appt scheduled, Other - See comment(TOC for meds) Post Acute Care Choice: [place]          DME Arranged: Wheelchair manual, Hospital bed, 3-N-1 DME Agency: [employer]       HH Arranged: PT, OT [facility] Agency: [place] Date [place]: 06/23/19 Time [place]: 27 Representative spoke with at [place]: [place] (SDOH) Interventions     Readmission Risk Interventions Readmission Risk Prevention Plan 06/23/2019 02/13/2019  Transportation Screening Complete Complete  Medication Review Press photographer) Complete Complete  PCP or Specialist appointment within 3-5 days of discharge Complete Complete  HRI or [place] Complete Complete  SW Recovery Care/Counseling Consult Complete Complete  Palliative  Care Screening Not Applicable Not [place] Patient Refused Complete  Some recent data might be hidden

## 2019-06-27 NOTE — Progress Notes (Signed)
For transfer to [facility] ,[place] for rehab. Report given to Nancy,LPN. [facility] will transport patient. Personal belongings,discharged instructions given to [facility].Marland Kitchen

## 2019-06-29 ENCOUNTER — Non-Acute Institutional Stay (SKILLED_NURSING_FACILITY): Payer: PRIVATE HEALTH INSURANCE | Admitting: Adult Health

## 2019-06-29 ENCOUNTER — Encounter (HOSPITAL_COMMUNITY)
Admission: RE | Admit: 2019-06-29 | Discharge: 2019-06-29 | Disposition: A | Discharge status: Home / Self Care | Payer: Medicare Other | Source: Skilled Nursing Facility | Attending: Internal Medicine | Admitting: Internal Medicine

## 2019-06-29 DIAGNOSIS — Z7901 Long term (current) use of anticoagulants: Secondary | ICD-10-CM | POA: Insufficient documentation

## 2019-06-29 DIAGNOSIS — J449 Chronic obstructive pulmonary disease, unspecified: Secondary | ICD-10-CM | POA: Diagnosis not present

## 2019-06-29 DIAGNOSIS — N186 End stage renal disease: Secondary | ICD-10-CM | POA: Diagnosis not present

## 2019-06-29 DIAGNOSIS — I2782 Chronic pulmonary embolism: Secondary | ICD-10-CM

## 2019-06-29 DIAGNOSIS — Z992 Dependence on renal dialysis: Secondary | ICD-10-CM

## 2019-06-29 DIAGNOSIS — I82211 Chronic embolism and thrombosis of superior vena cava: Secondary | ICD-10-CM

## 2019-06-29 DIAGNOSIS — I9589 Other hypotension: Secondary | ICD-10-CM | POA: Diagnosis not present

## 2019-06-29 DIAGNOSIS — I120 Hypertensive chronic kidney disease with stage 5 chronic kidney disease or end stage renal disease: Secondary | ICD-10-CM

## 2019-06-29 DIAGNOSIS — I5032 Chronic diastolic (congestive) heart failure: Secondary | ICD-10-CM

## 2019-06-29 DIAGNOSIS — D509 Iron deficiency anemia, unspecified: Secondary | ICD-10-CM | POA: Diagnosis not present

## 2019-06-29 DIAGNOSIS — D631 Anemia in chronic kidney disease: Secondary | ICD-10-CM

## 2019-06-29 DIAGNOSIS — N2581 Secondary hyperparathyroidism of renal origin: Secondary | ICD-10-CM | POA: Diagnosis not present

## 2019-06-29 DIAGNOSIS — E1122 Type 2 diabetes mellitus with diabetic chronic kidney disease: Secondary | ICD-10-CM | POA: Diagnosis not present

## 2019-06-29 DIAGNOSIS — E1142 Type 2 diabetes mellitus with diabetic polyneuropathy: Secondary | ICD-10-CM

## 2019-06-29 DIAGNOSIS — S82891S Other fracture of right lower leg, sequela: Secondary | ICD-10-CM

## 2019-06-29 DIAGNOSIS — N185 Chronic kidney disease, stage 5: Secondary | ICD-10-CM

## 2019-06-29 DIAGNOSIS — G4733 Obstructive sleep apnea (adult) (pediatric): Secondary | ICD-10-CM | POA: Diagnosis not present

## 2019-06-29 DIAGNOSIS — K219 Gastro-esophageal reflux disease without esophagitis: Secondary | ICD-10-CM | POA: Diagnosis not present

## 2019-06-29 DIAGNOSIS — F339 Major depressive disorder, recurrent, unspecified: Secondary | ICD-10-CM

## 2019-06-29 LAB — PROTIME-INR
INR: 2.4 — ABNORMAL HIGH (ref 0.8–1.2)
Prothrombin Time: 26.2 seconds — ABNORMAL HIGH (ref 11.4–15.2)

## 2019-06-30 ENCOUNTER — Other Ambulatory Visit: Payer: Self-pay | Admitting: Adult Health

## 2019-06-30 ENCOUNTER — Encounter: Payer: Self-pay | Admitting: Internal Medicine

## 2019-06-30 ENCOUNTER — Encounter: Payer: Self-pay | Admitting: Adult Health

## 2019-06-30 ENCOUNTER — Non-Acute Institutional Stay (SKILLED_NURSING_FACILITY): Payer: Medicare Other | Admitting: Adult Health

## 2019-06-30 DIAGNOSIS — Z992 Dependence on renal dialysis: Secondary | ICD-10-CM | POA: Diagnosis not present

## 2019-06-30 DIAGNOSIS — E1122 Type 2 diabetes mellitus with diabetic chronic kidney disease: Secondary | ICD-10-CM

## 2019-06-30 DIAGNOSIS — S82891S Other fracture of right lower leg, sequela: Secondary | ICD-10-CM

## 2019-06-30 DIAGNOSIS — I5032 Chronic diastolic (congestive) heart failure: Secondary | ICD-10-CM

## 2019-06-30 DIAGNOSIS — N186 End stage renal disease: Secondary | ICD-10-CM

## 2019-06-30 MED ORDER — OXYCODONE-ACETAMINOPHEN 5-325 MG PO TABS: 1.0000 | ORAL_TABLET | Freq: Three times a day (TID) | ORAL | 0 refills | Status: DC | PRN
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
## 2019-06-30 NOTE — Progress Notes (Signed)
Location:    [place] [place] Room Number: 159/P Place of Service:  SNF (31)   CODE STATUS: Full Code  Allergies  Allergen Reactions  . Codeine Nausea And Vomiting    Chief Complaint  Patient presents with  . Acute Visit    [place],     HPI:  We have come together for his 48 hour care plan meeting. He had been hospitalized for a right ankle fracture status post orif. He is here for short term rehab; his goal is to return back home. He will need a hospital bed and a wheelchair. He lives alone; has a ramp to get into his house. He has a ride for his dialysis treatments.   Past Medical History:  Diagnosis Date  . 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 Alcohol abuse   . Anemia   . Asthma   . Cellulitis and abscess of right lower extremity 03/01/2017  . CHF (congestive heart failure) ([place])   . Chronic kidney disease    dialysis m,w,f  . Cognitive communication deficit   . COPD (chronic obstructive pulmonary disease) ([place])   . Coronary artery disease   . Diabetes mellitus without complication ([place])   . Edema extremities 03/01/2017   bilateral swelling  . GERD (gastroesophageal reflux disease)   . High cholesterol   . High triglycerides   . Hyperlipemia   . Hypertension   . Hypothyroidism   . Major depressive disorder   . Muscle weakness   . Neuropathy   . OSA (obstructive sleep apnea)   . Other abnormalities of gait and mobility   . Peripheral vascular disease ([place])   . Pneumonia   . Sleep apnea    can't use cpap d/t feelings of suffocation    Past Surgical History:  Procedure Laterality Date  . A/V FISTULAGRAM Right 08/27/2017   Procedure: A/V FISTULAGRAM;  Surgeon: Katha Cabal, MD;  Location: [place] [facility];  Service: Cardiovascular;  Laterality: Right;  . A/V FISTULAGRAM Right 11/04/2018   Procedure: A/V FISTULAGRAM;  Surgeon: Katha Cabal, MD;  Location: [facility] [facility];   Service: Cardiovascular;  Laterality: Right;  . A/V SHUNT INTERVENTION N/A 06/23/2018   Procedure: A/V SHUNT INTERVENTION;  Surgeon: Algernon Huxley, MD;  Location: [facility];  Service: Cardiovascular;  Laterality: N/A;  . APPENDECTOMY  2010  . AV FISTULA PLACEMENT Right 06/14/2017   Procedure: ARTERIOVENOUS (AV) FISTULA CREATION WRIST;  Surgeon: Katha Cabal, MD;  Location: [facility];  Service: Vascular;  Laterality: Right;  . DIALYSIS/PERMA CATHETER INSERTION N/A 03/05/2017   Procedure: Dialysis/Perma Catheter Insertion;  Surgeon: Katha Cabal, MD;  Location: [facility];  Service: Cardiovascular;  Laterality: N/A;  . DIALYSIS/PERMA CATHETER INSERTION N/A 09/20/2017   Procedure: DIALYSIS/PERMA CATHETER INSERTION;  Surgeon: Katha Cabal, MD;  Location: [facility] [facility];  Service: Cardiovascular;  Laterality: N/A;  . DIALYSIS/PERMA CATHETER INSERTION N/A 01/26/2019   Procedure: DIALYSIS/PERMA CATHETER INSERTION/Exchange;  Surgeon: Algernon Huxley, MD;  Location: [place] [place] [facility];  Service: Cardiovascular;  Laterality: N/A;  . DIALYSIS/PERMA CATHETER INSERTION N/A 01/27/2019   Procedure: DIALYSIS/PERMA CATHETER INSERTION;  Surgeon: Katha Cabal, MD;  Location: [facility];  Service: Cardiovascular;  Laterality: N/A;  . EXCHANGE OF A DIALYSIS CATHETER  03/29/2017   Procedure: Exchange Of A Dialysis Catheter;  Surgeon: Katha Cabal, MD;  Location: [facility];  Service: Cardiovascular;;  . IRRIGATION AND DEBRIDEMENT FOOT Right 03/01/2017   Procedure: IRRIGATION AND DEBRIDEMENT FOOT;  Surgeon: Albertine Patricia, DPM;  Location: [facility];  Service: Podiatry;  Laterality: Right;  application of wound vac  . ORIF ANKLE FRACTURE Right 06/19/2019   Procedure: OPEN REDUCTION INTERNAL FIXATION (ORIF) ANKLE FRACTURE;  Surgeon: Altamese [place], MD;  Location: [place];  Service: Orthopedics;  Laterality: Right;  . PERIPHERAL VASCULAR THROMBECTOMY Right  06/18/2018   Procedure: PERIPHERAL VASCULAR THROMBECTOMY;  Surgeon: Algernon Huxley, MD;  Location: [place];  Service: Cardiovascular;  Laterality: Right;  . REPAIR NON-UNION ULNA Right 06/19/2019   Procedure: Repair Non-Union right tibia;  Surgeon: Altamese Vancleave, MD;  Location: [facility];  Service: Orthopedics;  Laterality: Right;  Marland Kitchen VASCULAR ACCESS DEVICE INSERTION Right 01/30/2019   Procedure: INSERTION OF HERO VASCULAR ACCESS DEVICE;  Surgeon: Katha Cabal, MD;  Location: [facility];  Service: Vascular;  Laterality: Right;    Social History   Socioeconomic History  . Marital status: Divorced    Spouse name: Not on file  . Number of children: 1  . Years of education: Not on file  . Highest education level: Not on file  Occupational History  . Occupation: disability  Social Needs  . Financial resource strain: Not very hard  . Food insecurity    Worry: Never true    Inability: Never true  . Transportation needs    Medical: No    Non-medical: No  Tobacco Use  . Smoking status: Former Research scientist (life sciences)  . Smokeless tobacco: Current User    Types: Snuff  Substance and Sexual Activity  . Alcohol use: Yes    Comment: up until 01/2017 was heavy drinker...4 40 oz qd     twice a week drinker  . Drug use: No  . Sexual activity: Never  Lifestyle  . Physical activity    Days per week: 0 days    Minutes per session: 0 min  . Stress: Not at all  Relationships  . Social connections    Talks on phone: More than three times a week    Gets together: More than three times a week    Attends religious service: Never    Active member of club or organization: No    Attends meetings of clubs or organizations: Never    Relationship status: Divorced  . Intimate partner violence    Fear of current or ex partner: No    Emotionally abused: No    Physically abused: No    Forced sexual activity: No  Other Topics Concern  . Not on file  Social History Narrative  . Not on file   Family History   Problem Relation Age of Onset  . CAD Father   . Heart disease Father       VITAL SIGNS BP 106/73   Pulse (!) 55   Temp 97.9 F (36.6 C) (Oral)   Resp 18   Ht 5\' 11"  (1.803 m)   Wt 233 lb 6.4 oz (105.9 kg)   BMI 32.55 kg/m   Outpatient Encounter Medications as of 06/30/2019  Medication Sig  . acetaminophen (TYLENOL) 325 MG tablet Take 2 tablets (650 mg total) by mouth every 12 (twelve) hours.  . calcium acetate (PHOSLO) 667 MG capsule Take 2 capsules (1,334 mg total) by mouth 3 (three) times daily with meals.  . furosemide (LASIX) 80 MG tablet Take 80 mg by mouth daily.  Marland Kitchen gabapentin (NEURONTIN) 300 MG capsule Take  300 mg by mouth at bedtime.  . insulin aspart (NOVOLOG FLEXPEN) 100 UNIT/ML FlexPen Per Sliding Scale;  If Blood Sugar is less than 60, call MD. If Blood Sugar is 150 to 200, give 2 Units. If Blood Sugar is 201 to 250, give 4 Units. If Blood Sugar is 251 to 300, give 6 Units. If Blood Sugar is 301 to 350, give 8 Units. If Blood Sugar is 351 to 400, give 10 Units. If Blood Sugar is greater than 400, call MD. subcutaneous  With Meals  . insulin aspart (NOVOLOG) 100 UNIT/ML injection Inject 6 Units into the skin 3 (three) times daily with meals.  . Insulin Detemir (LEVEMIR FLEXTOUCH) 100 UNIT/ML Pen Inject 14 Units into the skin at bedtime.  . metoprolol tartrate (LOPRESSOR) 25 MG tablet Take 0.5 tablets (12.5 mg total) by mouth 2 (two) times daily.  . midodrine (PROAMATINE) 10 MG tablet Take 1 tablet (10 mg total) by mouth 2 (two) times daily with a meal.  . mirtazapine (REMERON) 15 MG tablet Take 15 mg by mouth at bedtime.   . mometasone-formoterol (DULERA) 100-5 MCG/ACT AERO Inhale 2 puffs into the lungs every morning.  . NON FORMULARY Diet: Regular, NAS, Consistent Carbohydrate  . NON FORMULARY Fluid restriction: 1200cc a day Special Instructions: Special Instructions: 500cc for days, Evening 500cc and night 200cc Every Shift Day, Evening, Night  . omeprazole  (PRILOSEC OTC) 20 MG tablet Take 1 tablet (20 mg total) by mouth daily.  . ondansetron (ZOFRAN) 4 MG tablet Take 1 tablet (4 mg total) by mouth every 6 (six) hours as needed for nausea.  Marland Kitchen oxyCODONE-acetaminophen (PERCOCET/ROXICET) 5-325 MG tablet Take 2 tablets by mouth every 8 (eight) hours as needed for severe pain.  Marland Kitchen oxyCODONE-acetaminophen (PERCOCET/ROXICET) 5-325 MG tablet Take 1 tablet by mouth every 8 (eight) hours as needed for severe pain.  Marland Kitchen PROAIR HFA 108 (90 Base) MCG/ACT inhaler Inhale 2 puffs into the lungs every 4 (four) hours as needed for wheezing or shortness of breath.   . sodium bicarbonate 650 MG tablet Take 1 tablet (650 mg total) by mouth 2 (two) times daily.  Marland Kitchen spironolactone (ALDACTONE) 25 MG tablet Take 25 mg by mouth daily.  Marland Kitchen warfarin (COUMADIN) 5 MG tablet Take 1.5 tablets (7.5 mg total) by mouth as directed. Take 7.5mg  on non-dialysis days only (Tuesdays, Thursdays, Saturdays, and Sundays).  . [DISCONTINUED] oxyCODONE-acetaminophen (PERCOCET/ROXICET) 5-325 MG tablet Take 1-2 tablets by mouth every 8 (eight) hours as needed for moderate pain or severe pain. (Patient taking differently: Take 2 tablets by mouth every 8 (eight) hours as needed for moderate pain or severe pain. )   No facility-administered encounter medications on file as of 06/30/2019.      SIGNIFICANT DIAGNOSTIC EXAMS  PREVIOUS;   06-16-19: right ankle x-ray" Oblique fracture through the distal tibial metaphysis.   06-16-19: ct of head: 1. No acute intracranial abnormality 2. Chronic small vessel ischemic change and brain atrophy.  06-16-19: ct of right ankle: 1. Remote appearing ununited distal tibia fractures. Possible acute on chronic fractures. 2. Healed distal fibular fracture. 3. Intact ankle mortise. No mid or hindfoot fractures are identified. 4. Extensive small vessel disease.  NO NEW EXAMS.   LABS REVIEWED PREVIOUS  06-16-19: wbc 8.6; hgb 11.8; hct 37.9 mcv 96.5 plt 152; glucose  81; bun 23; creat 4.38; k+ 4.9; ca 8.7; liver normal albumin 2.9  06-19-19: wbc 5.5; hgb 10.6; hct 33.6; mcv 86.4; plt 143; vit D 25.7; hgb a1c 6.1 06-22-19:  wbc 4.1; hgb 8.3; hct 27.4; mcv 89.8 ;plt 159; glucose 222; bun 29; creat 4.70; k+ 3.9; na++ 133; ca 8.1; phos 2.0 albumin 2.0 06-26-19: wbc 4.8; hgb 9.2; hct 28.7; mcv 87.8 ;plt 240; glucose 304; bun 33; creat 4.19; k+ 3.9; an++ 131; ca 7.9 phos 3.5; albumin 2.3 06-29-19: INR 2.4   NO NEW LABS.    Review of Systems  Constitutional: Negative for malaise/fatigue.  Respiratory: Negative for cough and shortness of breath.   Cardiovascular: Negative for chest pain, palpitations and leg swelling.  Gastrointestinal: Negative for abdominal pain, constipation and heartburn.  Musculoskeletal: Positive for joint pain. Negative for back pain and myalgias.       Right ankle pain is managed   Skin: Negative.   Neurological: Negative for dizziness.  Psychiatric/Behavioral: The patient is not nervous/anxious.     Physical Exam Constitutional:      General: He is not in acute distress.    Appearance: He is well-developed. He is obese. He is not diaphoretic.  Neck:     Musculoskeletal: Neck supple.     Thyroid: No thyromegaly.  Cardiovascular:     Rate and Rhythm: Normal rate and regular rhythm.     Pulses: Normal pulses.     Heart sounds: Normal heart sounds.  Pulmonary:     Effort: Pulmonary effort is normal. No respiratory distress.     Breath sounds: Normal breath sounds.  Abdominal:     General: Bowel sounds are normal. There is no distension.     Palpations: Abdomen is soft.     Tenderness: There is no abdominal tenderness.  Musculoskeletal:     Right lower leg: No edema.     Left lower leg: No edema.     Comments: Is able to move all extremities Right ankle ace wrap splint with wound vac in place.  06-19-19 Status post right ankle  ORIF   Lymphadenopathy:     Cervical: No cervical adenopathy.  Skin:    General: Skin is warm  and dry.     Comments:  Right upper arm a/v fistula: + thrill + bruit   Neurological:     Mental Status: He is alert and oriented to person, place, and time.  Psychiatric:        Mood and Affect: Mood normal.      ASSESSMENT/ PLAN:  TODAY  1. Closed right ankle fracture sequela 2. End stage renal disease on hemodialysis due to diabetes mellitus type 2 3. Chronic diastolic heart failure  Will continue therapy as directed Will continue current medications Will continue to monitor his status His goal is to return home He will need a hospital bed and wheelchair.        MD is aware of resident's narcotic use and is in agreement with current plan of care. We will attempt to wean resident as appropriate.  Ok Edwards NP [facility]  Contact [phone] Monday through Friday 8am- 5pm  After hours call [phone]

## 2019-07-01 ENCOUNTER — Non-Acute Institutional Stay (SKILLED_NURSING_FACILITY): Payer: Medicare Other | Admitting: Internal Medicine

## 2019-07-01 ENCOUNTER — Other Ambulatory Visit: Payer: Self-pay | Admitting: *Deleted

## 2019-07-01 DIAGNOSIS — E118 Type 2 diabetes mellitus with unspecified complications: Secondary | ICD-10-CM

## 2019-07-01 DIAGNOSIS — I2782 Chronic pulmonary embolism: Secondary | ICD-10-CM

## 2019-07-01 DIAGNOSIS — E1122 Type 2 diabetes mellitus with diabetic chronic kidney disease: Secondary | ICD-10-CM

## 2019-07-01 DIAGNOSIS — I120 Hypertensive chronic kidney disease with stage 5 chronic kidney disease or end stage renal disease: Secondary | ICD-10-CM

## 2019-07-01 DIAGNOSIS — S82871K Displaced pilon fracture of right tibia, subsequent encounter for closed fracture with nonunion: Secondary | ICD-10-CM | POA: Diagnosis not present

## 2019-07-01 DIAGNOSIS — Z992 Dependence on renal dialysis: Secondary | ICD-10-CM | POA: Diagnosis not present

## 2019-07-01 DIAGNOSIS — F339 Major depressive disorder, recurrent, unspecified: Secondary | ICD-10-CM | POA: Diagnosis not present

## 2019-07-01 DIAGNOSIS — N186 End stage renal disease: Secondary | ICD-10-CM

## 2019-07-01 DIAGNOSIS — S82891S Other fracture of right lower leg, sequela: Secondary | ICD-10-CM

## 2019-07-01 DIAGNOSIS — N185 Chronic kidney disease, stage 5: Secondary | ICD-10-CM

## 2019-07-01 DIAGNOSIS — S93431D Sprain of tibiofibular ligament of right ankle, subsequent encounter: Secondary | ICD-10-CM | POA: Diagnosis not present

## 2019-07-01 DIAGNOSIS — R791 Abnormal coagulation profile: Secondary | ICD-10-CM | POA: Diagnosis not present

## 2019-07-01 NOTE — Progress Notes (Signed)
Location:   [facility] Room Number: 2222 Place of Service:  SNF (31)   CODE STATUS: full code   Allergies  Allergen Reactions   Codeine Nausea And Vomiting    Chief Complaint  Patient presents with   Hospitalization Follow-up    HPI:  He is a 58 year old man who has been hospitalized from 06-16-19 through 06-27-19. He ambulates with a walker and fell twisting his right ankle. He had significant pain and present to the ED. He was found to have a right ankle fracture and had a ORIF of his right ankle on 06-19-19. He is here for short term rehab with his goal to return back home. He does have right ankle pain. He denies any insomnia;no cough or shortness of breath. He will continue to be followed for his chronic illnesses including:diabetes; hypertension; gerd.   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)    Alcohol abuse    Anemia    Asthma    Cellulitis and abscess of right lower extremity 03/01/2017   CHF (congestive heart failure) (HCC)    Chronic kidney disease    dialysis m,w,f   Cognitive communication deficit    COPD (chronic obstructive pulmonary disease) ([place])    Coronary artery disease    Diabetes mellitus without complication ([place])    Edema extremities 03/01/2017   bilateral swelling   GERD (gastroesophageal reflux disease)    High cholesterol    High triglycerides    Hyperlipemia    Hypertension    Hypothyroidism    Major depressive disorder    Muscle weakness    Neuropathy    OSA (obstructive sleep apnea)    Other abnormalities of gait and mobility    Peripheral vascular disease ([place])    Pneumonia    Sleep apnea    can't use cpap d/t feelings of suffocation    Past Surgical History:  Procedure Laterality Date   A/V FISTULAGRAM Right 08/27/2017   Procedure: A/V FISTULAGRAM;  Surgeon: Katha Cabal, MD;  Location: [place] [facility];  Service:  Cardiovascular;  Laterality: Right;   A/V FISTULAGRAM Right 11/04/2018   Procedure: A/V FISTULAGRAM;  Surgeon: Katha Cabal, MD;  Location: [place] [facility];  Service: Cardiovascular;  Laterality: Right;   A/V SHUNT INTERVENTION N/A 06/23/2018   Procedure: A/V SHUNT INTERVENTION;  Surgeon: Algernon Huxley, MD;  Location: [place] [facility];  Service: Cardiovascular;  Laterality: N/A;   APPENDECTOMY  2010   AV FISTULA PLACEMENT Right 06/14/2017   Procedure: ARTERIOVENOUS (AV) FISTULA CREATION WRIST;  Surgeon: Katha Cabal, MD;  Location: [facility];  Service: Vascular;  Laterality: Right;   DIALYSIS/PERMA CATHETER INSERTION N/A 03/05/2017   Procedure: Dialysis/Perma Catheter Insertion;  Surgeon: Katha Cabal, MD;  Location: [place] [facility];  Service: Cardiovascular;  Laterality: N/A;   DIALYSIS/PERMA CATHETER INSERTION N/A 09/20/2017   Procedure: DIALYSIS/PERMA CATHETER INSERTION;  Surgeon: Katha Cabal, MD;  Location: [place] [facility];  Service: Cardiovascular;  Laterality: N/A;   DIALYSIS/PERMA CATHETER INSERTION N/A 01/26/2019   Procedure: DIALYSIS/PERMA CATHETER INSERTION/Exchange;  Surgeon: Algernon Huxley, MD;  Location: [place] [facility];  Service: Cardiovascular;  Laterality: N/A;   DIALYSIS/PERMA CATHETER INSERTION N/A 01/27/2019   Procedure: DIALYSIS/PERMA CATHETER INSERTION;  Surgeon: Katha Cabal, MD;  Location: [place] [facility];  Service: Cardiovascular;  Laterality: N/A;   EXCHANGE OF A DIALYSIS CATHETER  03/29/2017   Procedure: Exchange Of A Dialysis Catheter;  Surgeon: Katha Cabal, MD;  Location: [place] [facility];  Service: Cardiovascular;;   IRRIGATION AND DEBRIDEMENT FOOT Right 03/01/2017   Procedure: IRRIGATION AND DEBRIDEMENT FOOT;  Surgeon: Albertine Patricia, DPM;  Location: [facility];  Service: Podiatry;  Laterality: Right;  application of wound vac   ORIF ANKLE FRACTURE Right 06/19/2019   Procedure: OPEN REDUCTION  INTERNAL FIXATION (ORIF) ANKLE FRACTURE;  Surgeon: Altamese Byron, MD;  Location: [place];  Service: Orthopedics;  Laterality: Right;   PERIPHERAL VASCULAR THROMBECTOMY Right 06/18/2018   Procedure: PERIPHERAL VASCULAR THROMBECTOMY;  Surgeon: Algernon Huxley, MD;  Location: [place] [facility];  Service: Cardiovascular;  Laterality: Right;   REPAIR NON-UNION ULNA Right 06/19/2019   Procedure: Repair Non-Union right tibia;  Surgeon: Altamese Erma, MD;  Location: [place];  Service: Orthopedics;  Laterality: Right;   VASCULAR ACCESS DEVICE INSERTION Right 01/30/2019   Procedure: INSERTION OF HERO VASCULAR ACCESS DEVICE;  Surgeon: Katha Cabal, MD;  Location: [facility];  Service: Vascular;  Laterality: Right;    Social History   Socioeconomic History   Marital status: Divorced    Spouse name: Not on file   Number of children: 1   Years of education: Not on file   Highest education level: Not on file  Occupational History   Occupation: disability  Social Needs   Financial resource strain: Not very hard   Food insecurity    Worry: Never true    Inability: Never true   Transportation needs    Medical: No    Non-medical: No  Tobacco Use   Smoking status: Former Smoker   Smokeless tobacco: Current User    Types: Snuff  Substance and Sexual Activity   Alcohol use: Yes    Comment: up until 01/2017 was heavy drinker...4 40 oz qd     twice a week drinker   Drug use: No   Sexual activity: Never  Lifestyle   Physical activity    Days per week: 0 days    Minutes per session: 0 min   Stress: Not at all  Relationships   Social connections    Talks on phone: More than three times a week    Gets together: More than three times a week    Attends religious service: Never    Active member of club or organization: No    Attends meetings of clubs or organizations: Never    Relationship status: Divorced   Intimate partner violence    Fear of current or ex partner: No     Emotionally abused: No    Physically abused: No    Forced sexual activity: No  Other Topics Concern   Not on file  Social History Narrative   Not on file   Family History  Problem Relation Age of Onset   CAD Father    Heart disease Father       VITAL SIGNS BP 110/68    Pulse 78    Temp 97.9 F (36.6 C)    Resp 14    Ht 5\' 11"  (1.803 m)    Wt 233 lb (105.7 kg)    BMI 32.50 kg/m   Outpatient Encounter Medications as of 06/29/2019  Medication Sig   acetaminophen (TYLENOL) 325 MG tablet Take 2 tablets (650 mg total) by mouth every 12 (twelve) hours.   calcium acetate (PHOSLO) 667 MG capsule Take 2 capsules (1,334 mg total) by mouth 3 (three) times  daily with meals.   furosemide (LASIX) 80 MG tablet Take 80 mg by mouth daily.   gabapentin (NEURONTIN) 300 MG capsule Take 300 mg by mouth at bedtime.   insulin aspart (NOVOLOG) 100 UNIT/ML injection Inject 6 Units into the skin 3 (three) times daily with meals.   metoprolol tartrate (LOPRESSOR) 25 MG tablet Take 0.5 tablets (12.5 mg total) by mouth 2 (two) times daily.   midodrine (PROAMATINE) 10 MG tablet Take 1 tablet (10 mg total) by mouth 2 (two) times daily with a meal.   mirtazapine (REMERON) 15 MG tablet Take 15 mg by mouth at bedtime.    mometasone-formoterol (DULERA) 100-5 MCG/ACT AERO Inhale 2 puffs into the lungs every morning.   omeprazole (PRILOSEC OTC) 20 MG tablet Take 1 tablet (20 mg total) by mouth daily.   ondansetron (ZOFRAN) 4 MG tablet Take 1 tablet (4 mg total) by mouth every 6 (six) hours as needed for nausea.   PROAIR HFA 108 (90 Base) MCG/ACT inhaler Inhale 2 puffs into the lungs every 4 (four) hours as needed for wheezing or shortness of breath.    sodium bicarbonate 650 MG tablet Take 1 tablet (650 mg total) by mouth 2 (two) times daily.   spironolactone (ALDACTONE) 25 MG tablet Take 25 mg by mouth daily.   warfarin (COUMADIN) 5 MG tablet Take 1.5 tablets (7.5 mg total) by mouth as directed.  Take 7.5mg  on non-dialysis days only (Tuesdays, Thursdays, Saturdays, and Sundays).    insulin detemir (LEVEMIR) 100 UNIT/ML injection Inject 0.1 mLs (10 Units total) into the skin daily.    oxyCODONE-acetaminophen (PERCOCET/ROXICET) 5-325 MG tablet Take 1-2 tablets by mouth every 8 (eight) hours as needed for moderate pain or severe pain.   No facility-administered encounter medications on file as of 06/29/2019.      SIGNIFICANT DIAGNOSTIC EXAMS  TODAY;   06-16-19: right ankle x-ray" Oblique fracture through the distal tibial metaphysis.   06-16-19: ct of head: 1. No acute intracranial abnormality 2. Chronic small vessel ischemic change and brain atrophy.  06-16-19: ct of right ankle: 1. Remote appearing ununited distal tibia fractures. Possible acute on chronic fractures. 2. Healed distal fibular fracture. 3. Intact ankle mortise. No mid or hindfoot fractures are identified. 4. Extensive small vessel disease.   LABS REVIEWED TODAY  06-16-19: wbc 8.6; hgb 11.8; hct 37.9 mcv 96.5 plt 152; glucose 81; bun 23; creat 4.38; k+ 4.9; ca 8.7; liver normal albumin 2.9  06-19-19: wbc 5.5; hgb 10.6; hct 33.6; mcv 86.4; plt 143; vit D 25.7; hgb a1c 6.1 06-22-19: wbc 4.1; hgb 8.3; hct 27.4; mcv 89.8 ;plt 159; glucose 222; bun 29; creat 4.70; k+ 3.9; na++ 133; ca 8.1; phos 2.0 albumin 2.0 06-26-19: wbc 4.8; hgb 9.2; hct 28.7; mcv 87.8 ;plt 240; glucose 304; bun 33; creat 4.19; k+ 3.9; an++ 131; ca 7.9 phos 3.5; albumin 2.3 06-29-19: INR 2.4    Review of Systems  Constitutional: Negative for malaise/fatigue.  Respiratory: Negative for cough and shortness of breath.   Cardiovascular: Negative for chest pain, palpitations and leg swelling.  Gastrointestinal: Negative for abdominal pain, constipation and heartburn.  Musculoskeletal: Positive for joint pain. Negative for back pain and myalgias.       Right ankle pain   Skin: Negative.   Neurological: Negative for dizziness.  Psychiatric/Behavioral: The  patient is not nervous/anxious.       Physical Exam Constitutional:      General: He is not in acute distress.    Appearance: He is  well-developed. He is obese. He is not diaphoretic.  Neck:     Musculoskeletal: Neck supple.     Thyroid: No thyromegaly.  Cardiovascular:     Rate and Rhythm: Normal rate and regular rhythm.     Pulses: Normal pulses.     Heart sounds: Normal heart sounds.  Pulmonary:     Effort: Pulmonary effort is normal. No respiratory distress.     Breath sounds: Normal breath sounds.  Abdominal:     General: Bowel sounds are normal. There is no distension.     Palpations: Abdomen is soft.     Tenderness: There is no abdominal tenderness.  Musculoskeletal:     Right lower leg: No edema.     Left lower leg: No edema.     Comments: Is able to move all extremities Right ankle ace wrap splint with wound vac in place.  06-19-19 Status post right ankle  ORIF   Lymphadenopathy:     Cervical: No cervical adenopathy.  Skin:    General: Skin is warm and dry.     Comments: Right upper arm a/v fistula: + thrill + bruit  Neurological:     Mental Status: He is alert and oriented to person, place, and time.  Psychiatric:        Mood and Affect: Mood normal.      ASSESSMENT/ PLAN:  TODAY;   1.  Hypertension associated with stage 5 chronic kidney disease due to type 2 diabetes mellitus: is stable b/p 110/68 will continue lopressor 12.5 mg twice daily aldactone 25 mg daily   2. Chronic diastolic congestive heart failure: is stable will continue aldactone 25 mg daily lasix 80 mg daily and lopressor 12.5 mg twice daily   3. Other specified hypothyroidism: is stable b/p 110/68 will continue midodrine 10 mg twice daily due to his dialysis  4. COPD, mild: is stable will continue dulera 100-5 mcg 2 puffs daily; pro-air 2 puffs every 4 hours as needed  5. OSA (obstructive sleep apnea) is without change; does not use CPAP  6. GERD without esophagitis: is stable will  continue prilosec 20 mg daily   7. Type 2 diabetes mellitus with hypertension and end stage renal disease on dialysis: is stable hgb a1c 6.1 is stable will continue levemir 10 units nightly and novolog 6 units with meals  8. 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 Closed right ankle fracture sequela: is stable is non-weight bearing for 8 weeks. Will continue therapy as directed and will follow up with orthopedics; will continue tylenol 650 mg twice daily and has percocet 5/325 mg 1 or 2 tabs every 8 hours as needed  10.  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 Will continue phoslo 1334 mg three times daily will continue sodium bicarbonate 650 mg twice daily   11.  Anemia due to chronic renal disease on chronic dialysis: is stable hgb 9.2 will monitor   12. Morbid obesity: is without change: BMI 32.55 will monitor   13.  Major depression recurrent chronic: is stable will continue remeron 15 mg nightly   14. Diabetic peripheral neuropathy due to type 2 diabetes mellitus: is stable will continue neurontin 300 mg nightly and tylenol 650 mg twice daily  MD is aware of resident's narcotic use and is in agreement with current plan of care. We will attempt to wean resident as appropriate.  Ok Edwards NP [facility]  Contact [phone] Monday through Friday 8am- 5pm  After hours call [phone]

## 2019-07-01 NOTE — Patient Outreach (Signed)
Member assessed for potential [facility] Care Management needs as a benefit of  [place] Medicare.  Member is currently receiving rehab therapy at [facility].  Collaboration with Radiation protection practitioner. Writer informed that Mr. Govoni is anxious to return home. Writer advised that member is alert and oriented and should be contacted for [facility] follow up discussion.   Will plan to outreach to Mr. Uselman to discuss [place] Management.  Marthenia Rolling, MSN-Ed, RN,BSN [place] Acute Care Coordinator [phone] [facility]) [phone]  (Toll free office)

## 2019-07-02 DIAGNOSIS — E1122 Type 2 diabetes mellitus with diabetic chronic kidney disease: Secondary | ICD-10-CM | POA: Insufficient documentation

## 2019-07-02 DIAGNOSIS — I120 Hypertensive chronic kidney disease with stage 5 chronic kidney disease or end stage renal disease: Secondary | ICD-10-CM | POA: Insufficient documentation

## 2019-07-02 DIAGNOSIS — S82891S Other fracture of right lower leg, sequela: Secondary | ICD-10-CM | POA: Insufficient documentation

## 2019-07-02 DIAGNOSIS — D509 Iron deficiency anemia, unspecified: Secondary | ICD-10-CM | POA: Diagnosis not present

## 2019-07-02 DIAGNOSIS — N2581 Secondary hyperparathyroidism of renal origin: Secondary | ICD-10-CM | POA: Diagnosis not present

## 2019-07-02 DIAGNOSIS — N186 End stage renal disease: Secondary | ICD-10-CM | POA: Diagnosis not present

## 2019-07-02 DIAGNOSIS — F339 Major depressive disorder, recurrent, unspecified: Secondary | ICD-10-CM | POA: Insufficient documentation

## 2019-07-02 DIAGNOSIS — E1142 Type 2 diabetes mellitus with diabetic polyneuropathy: Secondary | ICD-10-CM | POA: Insufficient documentation

## 2019-07-02 DIAGNOSIS — I5032 Chronic diastolic (congestive) heart failure: Secondary | ICD-10-CM | POA: Insufficient documentation

## 2019-07-02 DIAGNOSIS — D631 Anemia in chronic kidney disease: Secondary | ICD-10-CM | POA: Diagnosis not present

## 2019-07-02 DIAGNOSIS — Z992 Dependence on renal dialysis: Secondary | ICD-10-CM | POA: Diagnosis not present

## 2019-07-02 DIAGNOSIS — I2782 Chronic pulmonary embolism: Secondary | ICD-10-CM | POA: Insufficient documentation

## 2019-07-02 DIAGNOSIS — I82211 Chronic embolism and thrombosis of superior vena cava: Secondary | ICD-10-CM | POA: Insufficient documentation

## 2019-07-03 DIAGNOSIS — Z992 Dependence on renal dialysis: Secondary | ICD-10-CM | POA: Diagnosis not present

## 2019-07-03 DIAGNOSIS — D509 Iron deficiency anemia, unspecified: Secondary | ICD-10-CM | POA: Diagnosis not present

## 2019-07-03 DIAGNOSIS — N186 End stage renal disease: Secondary | ICD-10-CM | POA: Diagnosis not present

## 2019-07-03 DIAGNOSIS — D631 Anemia in chronic kidney disease: Secondary | ICD-10-CM | POA: Diagnosis not present

## 2019-07-03 DIAGNOSIS — N2581 Secondary hyperparathyroidism of renal origin: Secondary | ICD-10-CM | POA: Diagnosis not present

## 2019-07-05 ENCOUNTER — Encounter: Payer: Self-pay | Admitting: Internal Medicine

## 2019-07-05 DIAGNOSIS — R791 Abnormal coagulation profile: Secondary | ICD-10-CM | POA: Insufficient documentation

## 2019-07-05 DIAGNOSIS — Z86711 Personal history of pulmonary embolism: Secondary | ICD-10-CM | POA: Insufficient documentation

## 2019-07-06 DIAGNOSIS — D631 Anemia in chronic kidney disease: Secondary | ICD-10-CM | POA: Diagnosis not present

## 2019-07-06 DIAGNOSIS — N186 End stage renal disease: Secondary | ICD-10-CM | POA: Diagnosis not present

## 2019-07-06 DIAGNOSIS — N2581 Secondary hyperparathyroidism of renal origin: Secondary | ICD-10-CM | POA: Diagnosis not present

## 2019-07-06 DIAGNOSIS — Z992 Dependence on renal dialysis: Secondary | ICD-10-CM | POA: Diagnosis not present

## 2019-07-06 DIAGNOSIS — D509 Iron deficiency anemia, unspecified: Secondary | ICD-10-CM | POA: Diagnosis not present

## 2019-07-07 ENCOUNTER — Inpatient Hospital Stay: Payer: PRIVATE HEALTH INSURANCE | Admitting: Family Medicine

## 2019-07-07 ENCOUNTER — Other Ambulatory Visit: Payer: Self-pay | Admitting: *Deleted

## 2019-07-07 ENCOUNTER — Non-Acute Institutional Stay (SKILLED_NURSING_FACILITY): Payer: Medicare Other | Admitting: Adult Health

## 2019-07-07 ENCOUNTER — Encounter: Payer: Self-pay | Admitting: Adult Health

## 2019-07-07 ENCOUNTER — Other Ambulatory Visit (HOSPITAL_COMMUNITY)
Admission: RE | Admit: 2019-07-07 | Discharge: 2019-07-07 | Disposition: A | Discharge status: Home / Self Care | Payer: Medicare Other | Source: Skilled Nursing Facility | Attending: Adult Health | Admitting: Adult Health

## 2019-07-07 DIAGNOSIS — S82891S Other fracture of right lower leg, sequela: Secondary | ICD-10-CM

## 2019-07-07 DIAGNOSIS — I120 Hypertensive chronic kidney disease with stage 5 chronic kidney disease or end stage renal disease: Secondary | ICD-10-CM | POA: Diagnosis not present

## 2019-07-07 DIAGNOSIS — N185 Chronic kidney disease, stage 5: Secondary | ICD-10-CM | POA: Diagnosis not present

## 2019-07-07 DIAGNOSIS — E1122 Type 2 diabetes mellitus with diabetic chronic kidney disease: Secondary | ICD-10-CM | POA: Diagnosis not present

## 2019-07-07 DIAGNOSIS — I5032 Chronic diastolic (congestive) heart failure: Secondary | ICD-10-CM | POA: Diagnosis not present

## 2019-07-07 DIAGNOSIS — Z7901 Long term (current) use of anticoagulants: Secondary | ICD-10-CM | POA: Insufficient documentation

## 2019-07-07 LAB — PROTIME-INR
INR: 1.6 — ABNORMAL HIGH (ref 0.8–1.2)
Prothrombin Time: 18.4 seconds — ABNORMAL HIGH (ref 11.4–15.2)

## 2019-07-07 NOTE — Progress Notes (Signed)
Location:    [facility] Room Number: 159/P Place of Service:  SNF (31)   CODE STATUS: Full Code  Allergies  Allergen Reactions  . Codeine Nausea And Vomiting   Chief Complaint  Patient presents with  . Medical Management of Chronic Issues         Closed right ankle fracture sequela  Hypertension associated with stage 5 chronic kidney disease due to type 2 diabetes mellitus:  Chronic diastolic congestive heart failure   Weekly follow up for the first 30 days post hospitalization.     HPI:  He is a 58 year old short term rehab patient being seen for the management of his chronic illnesses; right ankle fracture; hypertension; chf. He denies any uncontrolled pain; no chest pain;no shortness of breath; no changes in his appetite; no excessive thirst. He continues to participate in therapy,   Past Medical History:  Diagnosis Date  . 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 Alcohol abuse   . Anemia   . Asthma   . Cellulitis and abscess of right lower extremity 03/01/2017  . CHF (congestive heart failure) ([place])   . Chronic kidney disease    dialysis m,w,f  . Cognitive communication deficit   . COPD (chronic obstructive pulmonary disease) ([place])   . Coronary artery disease   . Diabetes mellitus without complication ([place])   . Edema extremities 03/01/2017   bilateral swelling  . GERD (gastroesophageal reflux disease)   . High cholesterol   . High triglycerides   . Hyperlipemia   . Hypertension   . Hypothyroidism   . Major depressive disorder   . Muscle weakness   . Neuropathy   . OSA (obstructive sleep apnea)   . Other abnormalities of gait and mobility   . Peripheral vascular disease ([place])   . Pneumonia   . Sleep apnea    can't use cpap d/t feelings of suffocation    Past Surgical History:  Procedure Laterality Date  . A/V FISTULAGRAM Right 08/27/2017   Procedure: A/V FISTULAGRAM;  Surgeon: Katha Cabal, MD;  Location:  [place] [facility];  Service: Cardiovascular;  Laterality: Right;  . A/V FISTULAGRAM Right 11/04/2018   Procedure: A/V FISTULAGRAM;  Surgeon: Katha Cabal, MD;  Location: [place] [facility];  Service: Cardiovascular;  Laterality: Right;  . A/V SHUNT INTERVENTION N/A 06/23/2018   Procedure: A/V SHUNT INTERVENTION;  Surgeon: Algernon Huxley, MD;  Location: [place] [facility];  Service: Cardiovascular;  Laterality: N/A;  . APPENDECTOMY  2010  . AV FISTULA PLACEMENT Right 06/14/2017   Procedure: ARTERIOVENOUS (AV) FISTULA CREATION WRIST;  Surgeon: Katha Cabal, MD;  Location: [facility];  Service: Vascular;  Laterality: Right;  . DIALYSIS/PERMA CATHETER INSERTION N/A 03/05/2017   Procedure: Dialysis/Perma Catheter Insertion;  Surgeon: Katha Cabal, MD;  Location: [place] [facility];  Service: Cardiovascular;  Laterality: N/A;  . DIALYSIS/PERMA CATHETER INSERTION N/A 09/20/2017   Procedure: DIALYSIS/PERMA CATHETER INSERTION;  Surgeon: Katha Cabal, MD;  Location: [place] [facility];  Service: Cardiovascular;  Laterality: N/A;  . DIALYSIS/PERMA CATHETER INSERTION N/A 01/26/2019   Procedure: DIALYSIS/PERMA CATHETER INSERTION/Exchange;  Surgeon: Algernon Huxley, MD;  Location: [place] [facility];  Service: Cardiovascular;  Laterality: N/A;  . DIALYSIS/PERMA CATHETER INSERTION N/A 01/27/2019   Procedure: DIALYSIS/PERMA CATHETER INSERTION;  Surgeon: Katha Cabal, MD;  Location: [place] [facility];  Service: Cardiovascular;  Laterality: N/A;  . EXCHANGE OF A  DIALYSIS CATHETER  03/29/2017   Procedure: Exchange Of A Dialysis Catheter;  Surgeon: Katha Cabal, MD;  Location: [place] [facility];  Service: Cardiovascular;;  . IRRIGATION AND DEBRIDEMENT FOOT Right 03/01/2017   Procedure: IRRIGATION AND DEBRIDEMENT FOOT;  Surgeon: Albertine Patricia, DPM;  Location: [facility];  Service: Podiatry;  Laterality: Right;  application of wound vac  . ORIF ANKLE FRACTURE Right 06/19/2019    Procedure: OPEN REDUCTION INTERNAL FIXATION (ORIF) ANKLE FRACTURE;  Surgeon: Altamese Takilma, MD;  Location: [place];  Service: Orthopedics;  Laterality: Right;  . PERIPHERAL VASCULAR THROMBECTOMY Right 06/18/2018   Procedure: PERIPHERAL VASCULAR THROMBECTOMY;  Surgeon: Algernon Huxley, MD;  Location: [place] [facility];  Service: Cardiovascular;  Laterality: Right;  . REPAIR NON-UNION ULNA Right 06/19/2019   Procedure: Repair Non-Union right tibia;  Surgeon: Altamese Tuscarora, MD;  Location: [facility];  Service: Orthopedics;  Laterality: Right;  Marland Kitchen VASCULAR ACCESS DEVICE INSERTION Right 01/30/2019   Procedure: INSERTION OF HERO VASCULAR ACCESS DEVICE;  Surgeon: Katha Cabal, MD;  Location: [facility];  Service: Vascular;  Laterality: Right;    Social History   Socioeconomic History  . Marital status: Divorced    Spouse name: Not on file  . Number of children: 1  . Years of education: Not on file  . Highest education level: Not on file  Occupational History  . Occupation: disability  Social Needs  . Financial resource strain: Not very hard  . Food insecurity    Worry: Never true    Inability: Never true  . Transportation needs    Medical: No    Non-medical: No  Tobacco Use  . Smoking status: Former Research scientist (life sciences)  . Smokeless tobacco: Current User    Types: Snuff  Substance and Sexual Activity  . Alcohol use: Yes    Comment: up until 01/2017 was heavy drinker...4 40 oz qd     twice a week drinker  . Drug use: No  . Sexual activity: Never  Lifestyle  . Physical activity    Days per week: 0 days    Minutes per session: 0 min  . Stress: Not at all  Relationships  . Social connections    Talks on phone: More than three times a week    Gets together: More than three times a week    Attends religious service: Never    Active member of club or organization: No    Attends meetings of clubs or organizations: Never    Relationship status: Divorced  . Intimate partner violence    Fear of current  or ex partner: No    Emotionally abused: No    Physically abused: No    Forced sexual activity: No  Other Topics Concern  . Not on file  Social History Narrative  . Not on file   Family History  Problem Relation Age of Onset  . CAD Father   . Heart disease Father       VITAL SIGNS BP 134/64   Pulse 66   Temp 98 F (36.7 C) (Oral)   Resp 20   Ht 5\' 11"  (1.803 m)   Wt 233 lb 12.8 oz (106.1 kg)   BMI 32.61 kg/m   Outpatient Encounter Medications as of 07/07/2019  Medication Sig  . acetaminophen (TYLENOL) 325 MG tablet Take 2 tablets (650 mg total) by mouth every 12 (twelve) hours.  . calcium acetate (PHOSLO) 667 MG capsule Take 2 capsules (1,334 mg total) by mouth 3 (three) times  daily with meals.  . furosemide (LASIX) 80 MG tablet Take 80 mg by mouth daily.  Marland Kitchen gabapentin (NEURONTIN) 300 MG capsule Take 300 mg by mouth at bedtime.  . insulin aspart (NOVOLOG FLEXPEN) 100 UNIT/ML FlexPen Per Sliding Scale;  If Blood Sugar is less than 60, call MD. If Blood Sugar is 150 to 200, give 2 Units. If Blood Sugar is 201 to 250, give 4 Units. If Blood Sugar is 251 to 300, give 6 Units. If Blood Sugar is 301 to 350, give 8 Units. If Blood Sugar is 351 to 400, give 10 Units. If Blood Sugar is greater than 400, call MD. subcutaneous  With Meals  . insulin aspart (NOVOLOG) 100 UNIT/ML injection Inject 6 Units into the skin 3 (three) times daily with meals.  . Insulin Detemir (LEVEMIR FLEXTOUCH) 100 UNIT/ML Pen Inject 14 Units into the skin at bedtime.  . metoprolol tartrate (LOPRESSOR) 25 MG tablet Take 0.5 tablets (12.5 mg total) by mouth 2 (two) times daily.  . midodrine (PROAMATINE) 10 MG tablet Take 1 tablet (10 mg total) by mouth 2 (two) times daily with a meal.  . mirtazapine (REMERON) 15 MG tablet Take 15 mg by mouth at bedtime.   . mometasone-formoterol (DULERA) 100-5 MCG/ACT AERO Inhale 2 puffs into the lungs every morning.  . NON FORMULARY Diet: Regular, NAS, Consistent  Carbohydrate  . NON FORMULARY Fluid restriction: 1200cc a day Special Instructions: Special Instructions: 500cc for days, Evening 500cc and night 200cc Every Shift Day, Evening, Night  . omeprazole (PRILOSEC OTC) 20 MG tablet Take 1 tablet (20 mg total) by mouth daily.  . ondansetron (ZOFRAN) 4 MG tablet Take 1 tablet (4 mg total) by mouth every 6 (six) hours as needed for nausea.  Marland Kitchen oxyCODONE-acetaminophen (PERCOCET/ROXICET) 5-325 MG tablet Take 2 tablets by mouth every 8 (eight) hours as needed for severe pain.  Marland Kitchen oxyCODONE-acetaminophen (PERCOCET/ROXICET) 5-325 MG tablet Take 1 tablet by mouth every 8 (eight) hours as needed for severe pain.  Marland Kitchen PROAIR HFA 108 (90 Base) MCG/ACT inhaler Inhale 2 puffs into the lungs every 4 (four) hours as needed for wheezing or shortness of breath.   . sodium bicarbonate 650 MG tablet Take 1 tablet (650 mg total) by mouth 2 (two) times daily.  Marland Kitchen spironolactone (ALDACTONE) 25 MG tablet Take 25 mg by mouth daily.  Marland Kitchen warfarin (COUMADIN) 5 MG tablet Take 1.5 tablets (7.5 mg total) by mouth as directed. Take 7.5mg  on non-dialysis days only (Tuesdays, Thursdays, Saturdays, and Sundays).   No facility-administered encounter medications on file as of 07/07/2019.      SIGNIFICANT DIAGNOSTIC EXAMS   PREVIOUS;   06-16-19: right ankle x-ray" Oblique fracture through the distal tibial metaphysis.   06-16-19: ct of head: 1. No acute intracranial abnormality 2. Chronic small vessel ischemic change and brain atrophy.  06-16-19: ct of right ankle: 1. Remote appearing ununited distal tibia fractures. Possible acute on chronic fractures. 2. Healed distal fibular fracture. 3. Intact ankle mortise. No mid or hindfoot fractures are identified. 4. Extensive small vessel disease.  NO NEW EXAMS.   LABS REVIEWED PREVIOUS  06-16-19: wbc 8.6; hgb 11.8; hct 37.9 mcv 96.5 plt 152; glucose 81; bun 23; creat 4.38; k+ 4.9; ca 8.7; liver normal albumin 2.9  06-19-19: wbc 5.5; hgb  10.6; hct 33.6; mcv 86.4; plt 143; vit D 25.7; hgb a1c 6.1 06-22-19: wbc 4.1; hgb 8.3; hct 27.4; mcv 89.8 ;plt 159; glucose 222; bun 29; creat 4.70; k+ 3.9; na++ 133; ca  8.1; phos 2.0 albumin 2.0 06-26-19: wbc 4.8; hgb 9.2; hct 28.7; mcv 87.8 ;plt 240; glucose 304; bun 33; creat 4.19; k+ 3.9; an++ 131; ca 7.9 phos 3.5; albumin 2.3 06-29-19: INR 2.4   TODAY  07-07-19: INR; 1.6   Review of Systems  Constitutional: Negative for malaise/fatigue.  Respiratory: Negative for cough and shortness of breath.   Cardiovascular: Negative for chest pain, palpitations and leg swelling.  Gastrointestinal: Negative for abdominal pain, constipation and heartburn.  Musculoskeletal: Negative for back pain, joint pain and myalgias.  Skin: Negative.   Neurological: Negative for dizziness.  Psychiatric/Behavioral: The patient is not nervous/anxious.      Physical Exam Constitutional:      General: He is not in acute distress.    Appearance: He is well-developed. He is obese. He is not diaphoretic.  Neck:     Musculoskeletal: Neck supple.     Thyroid: No thyromegaly.  Cardiovascular:     Rate and Rhythm: Normal rate and regular rhythm.     Heart sounds: Normal heart sounds.  Pulmonary:     Effort: Pulmonary effort is normal. No respiratory distress.     Breath sounds: Normal breath sounds.  Abdominal:     General: Bowel sounds are normal. There is no distension.     Palpations: Abdomen is soft.     Tenderness: There is no abdominal tenderness.  Musculoskeletal:     Right lower leg: No edema.     Left lower leg: No edema.     Comments: Is able to move all extremities Right ankle ace wrap splint .  06-19-19 Status post right ankle  ORIF    Lymphadenopathy:     Cervical: No cervical adenopathy.  Skin:    General: Skin is warm and dry.     Comments: Right upper arm a/v fistula: + thrill + bruit    Neurological:     Mental Status: He is alert and oriented to person, place, and time.  Psychiatric:         Mood and Affect: Mood normal.      ASSESSMENT/ PLAN:  TODAY;   1. Closed right ankle fracture sequela is stable is non-weight bearing for total 8 weeks; will continue therapy as directed and will follow up with orthopedics; will continue tylenol 650 mg twice daily and has percocet 5/325 mg 1 or 2 tabs every 8 hours as needed for pain.   2. Hypertension associated with stage 5 chronic kidney disease due to type 2 diabetes mellitus: is stable b/p  134/64 will continue lopressor 12.5 mg twice daily and aldactone 25 mg daily   3. Chronic diastolic congestive heart failure is stable will continue aldactone 25 mg daily lasix 80 mg daily and lopressor 12.5 mg twice daily   PREVIOUS    4. Other specified hypotension: is stable b/p 134/64 will continue midodrine 10 mg twice daily due to his dialysis  5. COPD, mild: is stable will continue dulera 100-5 mcg 2 puffs daily; pro-air 2 puffs every 4 hours as needed  6. OSA (obstructive sleep apnea) is without change; does not use CPAP  7. GERD without esophagitis: is stable will continue prilosec 20 mg daily   8. Type 2 diabetes mellitus with hypertension and end stage renal disease on dialysis: is stable hgb a1c 6.1 is stable will continue levemir 10 units nightly and novolog 6 units with meals novolog SSI: 150-200: 2 units; 201-250: 4 units; 251-300: 6 units; 301-350: 8 units; 351-400: 10 units  9. 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   10.  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 Will continue phoslo 1334 mg three times daily will continue sodium bicarbonate 650 mg twice daily   11.  Anemia due to chronic renal disease on chronic dialysis: is stable hgb 9.2 will  monitor   12. Morbid obesity: is without change: BMI 32.61 will monitor   13.  Major depression recurrent chronic: is stable will continue remeron 15 mg nightly   14. Diabetic peripheral neuropathy due to type 2 diabetes mellitus: is stable will continue neurontin 300 mg nightly and tylenol 650 mg twice daily     MD is aware of resident's narcotic use and is in agreement with current plan of care. We will attempt to wean resident as appropriate.  Ok Edwards NP [facility]  Contact [phone] Monday through Friday 8am- 5pm  After hours call [phone]

## 2019-07-07 NOTE — Patient Outreach (Signed)
Member assessed for potential [facility] Care Management needs as a benefit of  [place] [employer].  Member is currently receiving rehab therapy at [facility].  Member discussed in weekly telephonic IDT meeting with facility staff, [facility] UM RN, [facility] MD Director, and writer.  Facility reports member is participating with therapy. He is impulsive at times. Facility reports Mr. Rehl is alert and oriented. Facility reports member is pretty adamant about returning home. Discussed that Mr. Jungers had a recent long term stay at [facility]. In the past, member had limited support at home.  Writer will continue to follow for [facility] Management needs. Will plan outreach to member to discuss [facility] follow up.   Marthenia Rolling, MSN-Ed, RN,BSN [place] Acute Care Coordinator [phone] [facility]) [phone]  (Toll free office)

## 2019-07-08 DIAGNOSIS — N2581 Secondary hyperparathyroidism of renal origin: Secondary | ICD-10-CM | POA: Diagnosis not present

## 2019-07-08 DIAGNOSIS — N186 End stage renal disease: Secondary | ICD-10-CM | POA: Diagnosis not present

## 2019-07-08 DIAGNOSIS — D509 Iron deficiency anemia, unspecified: Secondary | ICD-10-CM | POA: Diagnosis not present

## 2019-07-08 DIAGNOSIS — D631 Anemia in chronic kidney disease: Secondary | ICD-10-CM | POA: Diagnosis not present

## 2019-07-08 DIAGNOSIS — Z992 Dependence on renal dialysis: Secondary | ICD-10-CM | POA: Diagnosis not present

## 2019-07-09 DIAGNOSIS — D631 Anemia in chronic kidney disease: Secondary | ICD-10-CM | POA: Diagnosis not present

## 2019-07-09 DIAGNOSIS — N2581 Secondary hyperparathyroidism of renal origin: Secondary | ICD-10-CM | POA: Diagnosis not present

## 2019-07-09 DIAGNOSIS — N186 End stage renal disease: Secondary | ICD-10-CM | POA: Diagnosis not present

## 2019-07-09 DIAGNOSIS — D509 Iron deficiency anemia, unspecified: Secondary | ICD-10-CM | POA: Diagnosis not present

## 2019-07-09 DIAGNOSIS — Z992 Dependence on renal dialysis: Secondary | ICD-10-CM | POA: Diagnosis not present

## 2019-07-09 DIAGNOSIS — Z23 Encounter for immunization: Secondary | ICD-10-CM | POA: Diagnosis not present

## 2019-07-10 DIAGNOSIS — Z992 Dependence on renal dialysis: Secondary | ICD-10-CM | POA: Diagnosis not present

## 2019-07-10 DIAGNOSIS — N186 End stage renal disease: Secondary | ICD-10-CM | POA: Diagnosis not present

## 2019-07-10 DIAGNOSIS — D509 Iron deficiency anemia, unspecified: Secondary | ICD-10-CM | POA: Diagnosis not present

## 2019-07-10 DIAGNOSIS — Z23 Encounter for immunization: Secondary | ICD-10-CM | POA: Diagnosis not present

## 2019-07-10 DIAGNOSIS — D631 Anemia in chronic kidney disease: Secondary | ICD-10-CM | POA: Diagnosis not present

## 2019-07-10 DIAGNOSIS — N2581 Secondary hyperparathyroidism of renal origin: Secondary | ICD-10-CM | POA: Diagnosis not present

## 2019-07-13 ENCOUNTER — Non-Acute Institutional Stay (SKILLED_NURSING_FACILITY): Payer: Medicare Other | Admitting: Adult Health

## 2019-07-13 ENCOUNTER — Encounter: Payer: Self-pay | Admitting: Adult Health

## 2019-07-13 DIAGNOSIS — J449 Chronic obstructive pulmonary disease, unspecified: Secondary | ICD-10-CM

## 2019-07-13 DIAGNOSIS — I953 Hypotension of hemodialysis: Secondary | ICD-10-CM | POA: Diagnosis not present

## 2019-07-13 DIAGNOSIS — S82891S Other fracture of right lower leg, sequela: Secondary | ICD-10-CM | POA: Diagnosis not present

## 2019-07-13 DIAGNOSIS — Z23 Encounter for immunization: Secondary | ICD-10-CM | POA: Diagnosis not present

## 2019-07-13 DIAGNOSIS — Z992 Dependence on renal dialysis: Secondary | ICD-10-CM | POA: Diagnosis not present

## 2019-07-13 DIAGNOSIS — D509 Iron deficiency anemia, unspecified: Secondary | ICD-10-CM | POA: Diagnosis not present

## 2019-07-13 DIAGNOSIS — K219 Gastro-esophageal reflux disease without esophagitis: Secondary | ICD-10-CM

## 2019-07-13 DIAGNOSIS — N2581 Secondary hyperparathyroidism of renal origin: Secondary | ICD-10-CM | POA: Diagnosis not present

## 2019-07-13 DIAGNOSIS — D631 Anemia in chronic kidney disease: Secondary | ICD-10-CM | POA: Diagnosis not present

## 2019-07-13 DIAGNOSIS — N186 End stage renal disease: Secondary | ICD-10-CM | POA: Diagnosis not present

## 2019-07-13 NOTE — Progress Notes (Signed)
Location:    [facility] Room Number: 144/P Place of Service:  SNF (31)   CODE STATUS: Full Code  Allergies  Allergen Reactions   Codeine Nausea And Vomiting   Chief Complaint  Patient presents with   Medical Management of Chronic Issues         Hemodialysis associated hypotension:  COPD mild  . GERD without esophagitis  Closed right ankle fracture sequela   Weekly follow up for the first 30 days post hospitalization.     HPI:  He is a 58 year old short term rehab patient being seen for the management of his chronic illnesses; hypotension; copd; gerd right ankle fracture. He has difficulty maintaining his non-weight bearing status. He is participating in therapy. He denies any changes in appetite; no anxiety or depression.   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)    Alcohol abuse    Anemia    Asthma    Cellulitis and abscess of right lower extremity 03/01/2017   CHF (congestive heart failure) (HCC)    Chronic kidney disease    dialysis m,w,f   Cognitive communication deficit    COPD (chronic obstructive pulmonary disease) ([place])    Coronary artery disease    Diabetes mellitus without complication ([place])    Edema extremities 03/01/2017   bilateral swelling   GERD (gastroesophageal reflux disease)    High cholesterol    High triglycerides    Hyperlipemia    Hypertension    Hypothyroidism    Major depressive disorder    Muscle weakness    Neuropathy    OSA (obstructive sleep apnea)    Other abnormalities of gait and mobility    Peripheral vascular disease ([place])    Pneumonia    Sleep apnea    can't use cpap d/t feelings of suffocation    Past Surgical History:  Procedure Laterality Date   A/V FISTULAGRAM Right 08/27/2017   Procedure: A/V FISTULAGRAM;  Surgeon: Katha Cabal, MD;  Location: [facility];  Service: Cardiovascular;  Laterality:  Right;   A/V FISTULAGRAM Right 11/04/2018   Procedure: A/V FISTULAGRAM;  Surgeon: Katha Cabal, MD;  Location: [facility];  Service: Cardiovascular;  Laterality: Right;   A/V SHUNT INTERVENTION N/A 06/23/2018   Procedure: A/V SHUNT INTERVENTION;  Surgeon: Algernon Huxley, MD;  Location: [facility];  Service: Cardiovascular;  Laterality: N/A;   APPENDECTOMY  2010   AV FISTULA PLACEMENT Right 06/14/2017   Procedure: ARTERIOVENOUS (AV) FISTULA CREATION WRIST;  Surgeon: Katha Cabal, MD;  Location: [facility];  Service: Vascular;  Laterality: Right;   DIALYSIS/PERMA CATHETER INSERTION N/A 03/05/2017   Procedure: Dialysis/Perma Catheter Insertion;  Surgeon: Katha Cabal, MD;  Location: [facility];  Service: Cardiovascular;  Laterality: N/A;   DIALYSIS/PERMA CATHETER INSERTION N/A 09/20/2017   Procedure: DIALYSIS/PERMA CATHETER INSERTION;  Surgeon: Katha Cabal, MD;  Location: [facility];  Service: Cardiovascular;  Laterality: N/A;   DIALYSIS/PERMA CATHETER INSERTION N/A 01/26/2019   Procedure: DIALYSIS/PERMA CATHETER INSERTION/Exchange;  Surgeon: Algernon Huxley, MD;  Location: [facility];  Service: Cardiovascular;  Laterality: N/A;   DIALYSIS/PERMA CATHETER INSERTION N/A 01/27/2019   Procedure: DIALYSIS/PERMA CATHETER INSERTION;  Surgeon: Katha Cabal, MD;  Location: [facility];  Service: Cardiovascular;  Laterality: N/A;   EXCHANGE OF A DIALYSIS CATHETER  03/29/2017   Procedure: Exchange Of A  Dialysis Catheter;  Surgeon: Katha Cabal, MD;  Location: [facility];  Service: Cardiovascular;;   IRRIGATION AND DEBRIDEMENT FOOT Right 03/01/2017   Procedure: IRRIGATION AND DEBRIDEMENT FOOT;  Surgeon: Albertine Patricia, DPM;  Location: [facility];  Service: Podiatry;  Laterality: Right;  application of wound vac   ORIF ANKLE FRACTURE Right 06/19/2019   Procedure: OPEN REDUCTION INTERNAL FIXATION (ORIF) ANKLE  FRACTURE;  Surgeon: Altamese El Campo, MD;  Location: [facility];  Service: Orthopedics;  Laterality: Right;   PERIPHERAL VASCULAR THROMBECTOMY Right 06/18/2018   Procedure: PERIPHERAL VASCULAR THROMBECTOMY;  Surgeon: Algernon Huxley, MD;  Location: [facility];  Service: Cardiovascular;  Laterality: Right;   REPAIR NON-UNION ULNA Right 06/19/2019   Procedure: Repair Non-Union right tibia;  Surgeon: Altamese Secaucus, MD;  Location: [facility];  Service: Orthopedics;  Laterality: Right;   VASCULAR ACCESS DEVICE INSERTION Right 01/30/2019   Procedure: INSERTION OF HERO VASCULAR ACCESS DEVICE;  Surgeon: Katha Cabal, MD;  Location: [facility];  Service: Vascular;  Laterality: Right;    Social History   Socioeconomic History   Marital status: Divorced    Spouse name: Not on file   Number of children: 1   Years of education: Not on file   Highest education level: Not on file  Occupational History   Occupation: disability  Social Needs   Financial resource strain: Not very hard   Food insecurity    Worry: Never true    Inability: Never true   Transportation needs    Medical: No    Non-medical: No  Tobacco Use   Smoking status: Former Smoker   Smokeless tobacco: Current User    Types: Snuff  Substance and Sexual Activity   Alcohol use: Yes    Comment: up until 01/2017 was heavy drinker...4 40 oz qd     twice a week drinker   Drug use: No   Sexual activity: Never  Lifestyle   Physical activity    Days per week: 0 days    Minutes per session: 0 min   Stress: Not at all  Relationships   Social connections    Talks on phone: More than three times a week    Gets together: More than three times a week    Attends religious service: Never    Active member of club or organization: No    Attends meetings of clubs or organizations: Never    Relationship status: Divorced   Intimate partner violence    Fear of current or ex partner: No    Emotionally abused: No     Physically abused: No    Forced sexual activity: No  Other Topics Concern   Not on file  Social History Narrative   Not on file   Family History  Problem Relation Age of Onset   CAD Father    Heart disease Father       VITAL SIGNS BP (!) 146/80    Pulse 61    Temp 98.4 F (36.9 C) (Oral)    Resp 20    Ht 5\' 11"  (1.803 m)    Wt 233 lb 12.8 oz (106.1 kg)    BMI 32.61 kg/m   Outpatient Encounter Medications as of 07/13/2019  Medication Sig   acetaminophen (TYLENOL) 325 MG tablet Take 2 tablets (650 mg total) by mouth every 12 (twelve) hours.   calcium acetate (PHOSLO) 667 MG capsule Take 2 capsules (1,334 mg total) by mouth 3 (three) times daily with meals.  furosemide (LASIX) 80 MG tablet Take 80 mg by mouth daily.   gabapentin (NEURONTIN) 300 MG capsule Take 300 mg by mouth at bedtime.   insulin aspart (NOVOLOG FLEXPEN) 100 UNIT/ML FlexPen Per Sliding Scale;  If Blood Sugar is less than 60, call MD. If Blood Sugar is 150 to 200, give 2 Units. If Blood Sugar is 201 to 250, give 4 Units. If Blood Sugar is 251 to 300, give 6 Units. If Blood Sugar is 301 to 350, give 8 Units. If Blood Sugar is 351 to 400, give 10 Units. If Blood Sugar is greater than 400, call MD. subcutaneous  With Meals   insulin aspart (NOVOLOG) 100 UNIT/ML injection Inject 6 Units into the skin 3 (three) times daily with meals.   Insulin Detemir (LEVEMIR FLEXTOUCH) 100 UNIT/ML Pen Inject 14 Units into the skin at bedtime.   metoprolol tartrate (LOPRESSOR) 25 MG tablet Take 0.5 tablets (12.5 mg total) by mouth 2 (two) times daily.   midodrine (PROAMATINE) 10 MG tablet Take 1 tablet (10 mg total) by mouth 2 (two) times daily with a meal.   mirtazapine (REMERON) 15 MG tablet Take 15 mg by mouth at bedtime.    mometasone-formoterol (DULERA) 100-5 MCG/ACT AERO Inhale 2 puffs into the lungs every morning.   NON FORMULARY Diet: Regular, NAS, Consistent Carbohydrate   NON FORMULARY Fluid  restriction: 1200cc a day Special Instructions: Special Instructions: 500cc for days, Evening 500cc and night 200cc Every Shift Day, Evening, Night   omeprazole (PRILOSEC OTC) 20 MG tablet Take 1 tablet (20 mg total) by mouth daily.   ondansetron (ZOFRAN) 4 MG tablet Take 1 tablet (4 mg total) by mouth every 6 (six) hours as needed for nausea.   oxyCODONE-acetaminophen (PERCOCET/ROXICET) 5-325 MG tablet Take 2 tablets by mouth every 8 (eight) hours as needed for severe pain.   oxyCODONE-acetaminophen (PERCOCET/ROXICET) 5-325 MG tablet Take 1 tablet by mouth every 8 (eight) hours as needed for severe pain.   PROAIR HFA 108 (90 Base) MCG/ACT inhaler Inhale 2 puffs into the lungs every 4 (four) hours as needed for wheezing or shortness of breath.    sodium bicarbonate 650 MG tablet Take 1 tablet (650 mg total) by mouth 2 (two) times daily.   spironolactone (ALDACTONE) 25 MG tablet Take 25 mg by mouth daily.   warfarin (COUMADIN) 5 MG tablet Special Instructions: Take 1.5 tabs (7.5 mg daily except Wednesday) Use appropriate precautions for receiving, handling, administration, and disposal. Gloves (single) should be worn during receiving, unpacking, and placing in storage. NIOSH recommends single gloving for administration of intact tablets or capsules. Once A Day on Sun, Mon, Tue, Thu, Fri, Sat   [DISCONTINUED] warfarin (COUMADIN) 5 MG tablet Take 1.5 tablets (7.5 mg total) by mouth as directed. Take 7.5mg  on non-dialysis days only (Tuesdays, Thursdays, Saturdays, and Sundays). (Patient taking differently: Special Instructions: Take 1.5 tabs (7.5 mg daily except Wednesday) Use appropriate precautions for receiving, handling, administration, and disposal. Gloves (single) should be worn during receiving, unpacking, and placing in storage. NIOSH recommends single gloving for administration of intact tablets or capsules. Once A Day on Sun, Mon, Tue, Thu, Fri, Sat)   No facility-administered  encounter medications on file as of 07/13/2019.      SIGNIFICANT DIAGNOSTIC EXAMS   PREVIOUS;   06-16-19: right ankle x-ray" Oblique fracture through the distal tibial metaphysis.   06-16-19: ct of head: 1. No acute intracranial abnormality 2. Chronic small vessel ischemic change and brain atrophy.  06-16-19: ct of  right ankle: 1. Remote appearing ununited distal tibia fractures. Possible acute on chronic fractures. 2. Healed distal fibular fracture. 3. Intact ankle mortise. No mid or hindfoot fractures are identified. 4. Extensive small vessel disease.  NO NEW EXAMS.   LABS REVIEWED PREVIOUS  06-16-19: wbc 8.6; hgb 11.8; hct 37.9 mcv 96.5 plt 152; glucose 81; bun 23; creat 4.38; k+ 4.9; ca 8.7; liver normal albumin 2.9  06-19-19: wbc 5.5; hgb 10.6; hct 33.6; mcv 86.4; plt 143; vit D 25.7; hgb a1c 6.1 06-22-19: wbc 4.1; hgb 8.3; hct 27.4; mcv 89.8 ;plt 159; glucose 222; bun 29; creat 4.70; k+ 3.9; na++ 133; ca 8.1; phos 2.0 albumin 2.0 06-26-19: wbc 4.8; hgb 9.2; hct 28.7; mcv 87.8 ;plt 240; glucose 304; bun 33; creat 4.19; k+ 3.9; an++ 131; ca 7.9 phos 3.5; albumin 2.3 06-29-19: INR 2.4  07-07-19: INR; 1.6   NO NEW LABS.   Review of Systems  Constitutional: Negative for malaise/fatigue.  Respiratory: Negative for cough and shortness of breath.   Cardiovascular: Negative for chest pain, palpitations and leg swelling.  Gastrointestinal: Negative for abdominal pain, constipation and heartburn.  Musculoskeletal: Negative for back pain, joint pain and myalgias.  Skin: Negative.   Neurological: Negative for dizziness.  Psychiatric/Behavioral: The patient is not nervous/anxious.     Physical Exam Constitutional:      General: He is not in acute distress.    Appearance: He is well-developed. He is obese. He is not diaphoretic.  Neck:     Musculoskeletal: Neck supple.     Thyroid: No thyromegaly.  Cardiovascular:     Rate and Rhythm: Normal rate and regular rhythm.     Pulses:  Normal pulses.     Heart sounds: Normal heart sounds.  Pulmonary:     Effort: Pulmonary effort is normal. No respiratory distress.     Breath sounds: Normal breath sounds.  Abdominal:     General: Bowel sounds are normal. There is no distension.     Palpations: Abdomen is soft.     Tenderness: There is no abdominal tenderness.  Musculoskeletal:     Right lower leg: No edema.     Left lower leg: No edema.     Comments: Is able to move all extremities Right ankle ace wrap splint .  06-19-19 Status post right ankle  ORIF     Lymphadenopathy:     Cervical: No cervical adenopathy.  Skin:    General: Skin is warm and dry.     Comments: Right upper arm a/v fistula: + thrill + bruit     Neurological:     Mental Status: He is alert and oriented to person, place, and time.  Psychiatric:        Mood and Affect: Mood normal.       ASSESSMENT/ PLAN:  TODAY;   1. Hemodialysis associated hypotension: is stable b/p 146/80 will continue midodrine 10 mg twice daily due to his dialysis  2. COPD mild is stable will continue dulera 100-5 mcg 2 puffs daily; pro-air 3 puffs every 4 hours as needed   3. GERD without esophagitis: is stable will continue prilosec 20 mg daily   4. Closed right ankle fracture sequela is stable is non-weight bearing for total 8 weeks; will continue therapy as directed; will follow up with orthopedics; will continue tylenol 650 mg twice daily and has percocet 5/325 mg 1 or 2 tabs every 8 hours as needed.   PREVIOUS    5. OSA (obstructive sleep apnea) is  without change; does not use CPAP  6. 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   8.  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 Will continue phoslo 1334 mg three times daily will continue sodium bicarbonate 650 mg twice daily   9.  Anemia due to chronic renal disease on chronic dialysis: is stable hgb 9.2 will monitor   10. Morbid obesity: is without change: BMI 32.72 will monitor   11.  Major depression recurrent chronic: is stable will continue remeron 15 mg nightly   12. Diabetic peripheral neuropathy due to type 2 diabetes mellitus: is stable will continue neurontin 300 mg nightly and tylenol 650 mg twice daily   13. Hypertension associated with stage 5 chronic kidney disease due to type 2 diabetes mellitus: is stable b/p  146/80 will continue lopressor 12.5 mg twice daily and aldactone 25 mg daily   14. Chronic diastolic congestive heart failure is stable will continue aldactone 25 mg daily lasix 80 mg daily and lopressor 12.5 mg twice daily     MD is aware of resident's narcotic use and is in agreement with current plan of care. We will attempt to wean resident as appropriate.  Ok Edwards NP [facility]  Contact [phone] Monday through Friday 8am- 5pm  After hours call [phone]

## 2019-07-14 ENCOUNTER — Encounter: Payer: Self-pay | Admitting: Adult Health

## 2019-07-14 ENCOUNTER — Other Ambulatory Visit: Payer: Self-pay | Admitting: *Deleted

## 2019-07-14 ENCOUNTER — Non-Acute Institutional Stay (SKILLED_NURSING_FACILITY): Payer: Medicare Other | Admitting: Adult Health

## 2019-07-14 ENCOUNTER — Other Ambulatory Visit (HOSPITAL_COMMUNITY)
Admission: RE | Admit: 2019-07-14 | Discharge: 2019-07-14 | Disposition: A | Discharge status: Home / Self Care | Payer: Medicare Other | Source: Skilled Nursing Facility | Attending: Adult Health | Admitting: Adult Health

## 2019-07-14 DIAGNOSIS — Z7901 Long term (current) use of anticoagulants: Secondary | ICD-10-CM | POA: Insufficient documentation

## 2019-07-14 DIAGNOSIS — I2782 Chronic pulmonary embolism: Secondary | ICD-10-CM

## 2019-07-14 DIAGNOSIS — I82211 Chronic embolism and thrombosis of superior vena cava: Secondary | ICD-10-CM

## 2019-07-14 LAB — PROTIME-INR
INR: 2.7 — ABNORMAL HIGH (ref 0.8–1.2)
Prothrombin Time: 28.5 seconds — ABNORMAL HIGH (ref 11.4–15.2)

## 2019-07-14 NOTE — Progress Notes (Signed)
Location:    [place] Room Number: 144/P Place of Service:  SNF (31)   CODE STATUS: Full Code  Allergies  Allergen Reactions  . Codeine Nausea And Vomiting    Chief Complaint  Patient presents with  . Anticoagulation    INR,     HPI:  His INR is 2.7 and his is presently taking coumadin 7.5 mg daily except Wednesday. His INR goal is 2-3. He is on chronic coumadin for pe and thrombosis. He denies any chest pain; no shortness of breath. He denies any heart palpitations. There are no reports of diet changes; no abnormal bleeding or bruising. He is nonadherent with his fluid restriction.   Past Medical History:  Diagnosis Date  . 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 Alcohol abuse   . Anemia   . Asthma   . Cellulitis and abscess of right lower extremity 03/01/2017  . CHF (congestive heart failure) ([place])   . Chronic kidney disease    dialysis m,w,f  . Cognitive communication deficit   . COPD (chronic obstructive pulmonary disease) ([place])   . Coronary artery disease   . Diabetes mellitus without complication ([place])   . Edema extremities 03/01/2017   bilateral swelling  . GERD (gastroesophageal reflux disease)   . High cholesterol   . High triglycerides   . Hyperlipemia   . Hypertension   . Hypothyroidism   . Major depressive disorder   . Muscle weakness   . Neuropathy   . OSA (obstructive sleep apnea)   . Other abnormalities of gait and mobility   . Peripheral vascular disease ([place])   . Pneumonia   . Sleep apnea    can't use cpap d/t feelings of suffocation    Past Surgical History:  Procedure Laterality Date  . A/V FISTULAGRAM Right 08/27/2017   Procedure: A/V FISTULAGRAM;  Surgeon: Katha Cabal, MD;  Location: [place] [facility];  Service: Cardiovascular;  Laterality: Right;  . A/V FISTULAGRAM Right 11/04/2018   Procedure: A/V FISTULAGRAM;  Surgeon: Katha Cabal, MD;  Location: [place] [facility];   Service: Cardiovascular;  Laterality: Right;  . A/V SHUNT INTERVENTION N/A 06/23/2018   Procedure: A/V SHUNT INTERVENTION;  Surgeon: Algernon Huxley, MD;  Location: [place] [facility];  Service: Cardiovascular;  Laterality: N/A;  . APPENDECTOMY  2010  . AV FISTULA PLACEMENT Right 06/14/2017   Procedure: ARTERIOVENOUS (AV) FISTULA CREATION WRIST;  Surgeon: Katha Cabal, MD;  Location: [facility];  Service: Vascular;  Laterality: Right;  . DIALYSIS/PERMA CATHETER INSERTION N/A 03/05/2017   Procedure: Dialysis/Perma Catheter Insertion;  Surgeon: Katha Cabal, MD;  Location: [place] [facility];  Service: Cardiovascular;  Laterality: N/A;  . DIALYSIS/PERMA CATHETER INSERTION N/A 09/20/2017   Procedure: DIALYSIS/PERMA CATHETER INSERTION;  Surgeon: Katha Cabal, MD;  Location: [place] [facility];  Service: Cardiovascular;  Laterality: N/A;  . DIALYSIS/PERMA CATHETER INSERTION N/A 01/26/2019   Procedure: DIALYSIS/PERMA CATHETER INSERTION/Exchange;  Surgeon: Algernon Huxley, MD;  Location: [place] [facility];  Service: Cardiovascular;  Laterality: N/A;  . DIALYSIS/PERMA CATHETER INSERTION N/A 01/27/2019   Procedure: DIALYSIS/PERMA CATHETER INSERTION;  Surgeon: Katha Cabal, MD;  Location: [place] [facility];  Service: Cardiovascular;  Laterality: N/A;  . EXCHANGE OF A DIALYSIS CATHETER  03/29/2017   Procedure: Exchange Of A Dialysis Catheter;  Surgeon: Katha Cabal, MD;  Location: [place] [facility];  Service: Cardiovascular;;  . IRRIGATION AND DEBRIDEMENT  FOOT Right 03/01/2017   Procedure: IRRIGATION AND DEBRIDEMENT FOOT;  Surgeon: Albertine Patricia, DPM;  Location: [facility];  Service: Podiatry;  Laterality: Right;  application of wound vac  . ORIF ANKLE FRACTURE Right 06/19/2019   Procedure: OPEN REDUCTION INTERNAL FIXATION (ORIF) ANKLE FRACTURE;  Surgeon: Altamese Converse, MD;  Location: [place];  Service: Orthopedics;  Laterality: Right;  . PERIPHERAL VASCULAR THROMBECTOMY Right  06/18/2018   Procedure: PERIPHERAL VASCULAR THROMBECTOMY;  Surgeon: Algernon Huxley, MD;  Location: [place] [facility];  Service: Cardiovascular;  Laterality: Right;  . REPAIR NON-UNION ULNA Right 06/19/2019   Procedure: Repair Non-Union right tibia;  Surgeon: Altamese Kirby, MD;  Location: [place];  Service: Orthopedics;  Laterality: Right;  Marland Kitchen VASCULAR ACCESS DEVICE INSERTION Right 01/30/2019   Procedure: INSERTION OF HERO VASCULAR ACCESS DEVICE;  Surgeon: Katha Cabal, MD;  Location: [facility];  Service: Vascular;  Laterality: Right;    Social History   Socioeconomic History  . Marital status: Divorced    Spouse name: Not on file  . Number of children: 1  . Years of education: Not on file  . Highest education level: Not on file  Occupational History  . Occupation: disability  Social Needs  . Financial resource strain: Not very hard  . Food insecurity    Worry: Never true    Inability: Never true  . Transportation needs    Medical: No    Non-medical: No  Tobacco Use  . Smoking status: Former Research scientist (life sciences)  . Smokeless tobacco: Current User    Types: Snuff  Substance and Sexual Activity  . Alcohol use: Yes    Comment: up until 01/2017 was heavy drinker...4 40 oz qd     twice a week drinker  . Drug use: No  . Sexual activity: Never  Lifestyle  . Physical activity    Days per week: 0 days    Minutes per session: 0 min  . Stress: Not at all  Relationships  . Social connections    Talks on phone: More than three times a week    Gets together: More than three times a week    Attends religious service: Never    Active member of club or organization: No    Attends meetings of clubs or organizations: Never    Relationship status: Divorced  . Intimate partner violence    Fear of current or ex partner: No    Emotionally abused: No    Physically abused: No    Forced sexual activity: No  Other Topics Concern  . Not on file  Social History Narrative  . Not on file   Family History   Problem Relation Age of Onset  . CAD Father   . Heart disease Father       VITAL SIGNS BP (!) 146/81   Pulse 61   Temp 98.4 F (36.9 C) (Oral)   Resp 20   Ht 5\' 11"  (1.803 m)   Wt 234 lb 9.6 oz (106.4 kg)   BMI 32.72 kg/m   Outpatient Encounter Medications as of 07/14/2019  Medication Sig  . acetaminophen (TYLENOL) 325 MG tablet Take 2 tablets (650 mg total) by mouth every 12 (twelve) hours.  . calcium acetate (PHOSLO) 667 MG capsule Take 2 capsules (1,334 mg total) by mouth 3 (three) times daily with meals.  . furosemide (LASIX) 80 MG tablet Take 80 mg by mouth daily.  Marland Kitchen gabapentin (NEURONTIN) 300 MG capsule Take 300 mg by mouth at bedtime.  Marland Kitchen  insulin aspart (NOVOLOG FLEXPEN) 100 UNIT/ML FlexPen Per Sliding Scale;  If Blood Sugar is less than 60, call MD. If Blood Sugar is 150 to 200, give 2 Units. If Blood Sugar is 201 to 250, give 4 Units. If Blood Sugar is 251 to 300, give 6 Units. If Blood Sugar is 301 to 350, give 8 Units. If Blood Sugar is 351 to 400, give 10 Units. If Blood Sugar is greater than 400, call MD. subcutaneous  With Meals  . insulin aspart (NOVOLOG) 100 UNIT/ML injection Inject 6 Units into the skin 3 (three) times daily with meals.  . Insulin Detemir (LEVEMIR FLEXTOUCH) 100 UNIT/ML Pen Inject 14 Units into the skin at bedtime.  . metoprolol tartrate (LOPRESSOR) 25 MG tablet Take 0.5 tablets (12.5 mg total) by mouth 2 (two) times daily.  . midodrine (PROAMATINE) 10 MG tablet Take 1 tablet (10 mg total) by mouth 2 (two) times daily with a meal.  . mirtazapine (REMERON) 15 MG tablet Take 15 mg by mouth at bedtime.   . mometasone-formoterol (DULERA) 100-5 MCG/ACT AERO Inhale 2 puffs into the lungs every morning.  . NON FORMULARY Diet: Regular, NAS, Consistent Carbohydrate  . NON FORMULARY Fluid restriction: 1200cc a day Special Instructions: Special Instructions: 500cc for days, Evening 500cc and night 200cc Every Shift Day, Evening, Night  . omeprazole  (PRILOSEC OTC) 20 MG tablet Take 1 tablet (20 mg total) by mouth daily.  . ondansetron (ZOFRAN) 4 MG tablet Take 1 tablet (4 mg total) by mouth every 6 (six) hours as needed for nausea.  Marland Kitchen oxyCODONE-acetaminophen (PERCOCET/ROXICET) 5-325 MG tablet Take 2 tablets by mouth every 8 (eight) hours as needed for severe pain.  Marland Kitchen oxyCODONE-acetaminophen (PERCOCET/ROXICET) 5-325 MG tablet Take 1 tablet by mouth every 8 (eight) hours as needed for severe pain.  Marland Kitchen PROAIR HFA 108 (90 Base) MCG/ACT inhaler Inhale 2 puffs into the lungs every 4 (four) hours as needed for wheezing or shortness of breath.   . sodium bicarbonate 650 MG tablet Take 1 tablet (650 mg total) by mouth 2 (two) times daily.  Marland Kitchen spironolactone (ALDACTONE) 25 MG tablet Take 25 mg by mouth daily.  Marland Kitchen warfarin (COUMADIN) 5 MG tablet Special Instructions: Take 1.5 tabs (7.5 mg daily except Wednesday) Use appropriate precautions for receiving, handling, administration, and disposal. Gloves (single) should be worn during receiving, unpacking, and placing in storage. NIOSH recommends single gloving for administration of intact tablets or capsules. Once A Day on Sun, Mon, Tue, Thu, Fri, Sat   No facility-administered encounter medications on file as of 07/14/2019.      SIGNIFICANT DIAGNOSTIC EXAMS   PREVIOUS;   06-16-19: right ankle x-ray" Oblique fracture through the distal tibial metaphysis.   06-16-19: ct of head: 1. No acute intracranial abnormality 2. Chronic small vessel ischemic change and brain atrophy.  06-16-19: ct of right ankle: 1. Remote appearing ununited distal tibia fractures. Possible acute on chronic fractures. 2. Healed distal fibular fracture. 3. Intact ankle mortise. No mid or hindfoot fractures are identified. 4. Extensive small vessel disease.  NO NEW EXAMS.   LABS REVIEWED PREVIOUS  06-16-19: wbc 8.6; hgb 11.8; hct 37.9 mcv 96.5 plt 152; glucose 81; bun 23; creat 4.38; k+ 4.9; ca 8.7; liver normal albumin 2.9   06-19-19: wbc 5.5; hgb 10.6; hct 33.6; mcv 86.4; plt 143; vit D 25.7; hgb a1c 6.1 06-22-19: wbc 4.1; hgb 8.3; hct 27.4; mcv 89.8 ;plt 159; glucose 222; bun 29; creat 4.70; k+ 3.9; na++ 133; ca 8.1;  phos 2.0 albumin 2.0 06-26-19: wbc 4.8; hgb 9.2; hct 28.7; mcv 87.8 ;plt 240; glucose 304; bun 33; creat 4.19; k+ 3.9; an++ 131; ca 7.9 phos 3.5; albumin 2.3 06-29-19: INR 2.4  07-07-19: INR; 1.6   TODAY;   07-14-19: INR 2.7   Review of Systems  Constitutional: Negative for malaise/fatigue.  Respiratory: Negative for cough and shortness of breath.   Cardiovascular: Negative for chest pain, palpitations and leg swelling.  Gastrointestinal: Negative for abdominal pain, constipation and heartburn.  Musculoskeletal: Negative for back pain, joint pain and myalgias.  Skin: Negative.   Neurological: Negative for dizziness.  Psychiatric/Behavioral: The patient is not nervous/anxious.       Physical Exam Constitutional:      General: He is not in acute distress.    Appearance: He is well-developed. He is obese. He is not diaphoretic.  Neck:     Thyroid: No thyromegaly.  Cardiovascular:     Rate and Rhythm: Normal rate and regular rhythm.     Pulses: Normal pulses.     Heart sounds: Normal heart sounds.  Pulmonary:     Effort: Pulmonary effort is normal. No respiratory distress.     Breath sounds: Normal breath sounds.  Abdominal:     General: Bowel sounds are normal. There is no distension.     Palpations: Abdomen is soft.     Tenderness: There is no abdominal tenderness.  Musculoskeletal:     Right lower leg: No edema.     Left lower leg: No edema.     Comments: Is able to move all extremities Has boot to right lower leg .  06-19-19 Status post right ankle  ORIF      Lymphadenopathy:     Cervical: No cervical adenopathy.  Skin:    General: Skin is warm and dry.     Comments: Right upper arm a/v fistula: + thrill + bruit     Neurological:     Mental Status: He is alert and oriented to  person, place, and time.  Psychiatric:        Mood and Affect: Mood normal.     ASSESSMENT/ PLAN:  TODAY;   1. Other pulmonary embolism without cor pulmonale 2. 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 Chronic anticoagulation  For INR 2.7 will continue coumadin 7.5 mg daily except for Wednesday Will check INR 07-28-19      MD is aware of resident's narcotic use and is in agreement with current plan of care. We will attempt to wean resident as appropriate.  Ok Edwards NP [facility]  Contact [phone] Monday through Friday 8am- 5pm  After hours call [phone]

## 2019-07-14 NOTE — Patient Outreach (Signed)
Member assessed for potential [facility] Care Management needs as a benefit of [place] [employer].  Telephone call made to Mr. Call at [phone] after telephonic IDT meeting today. Patient identifiers confirmed. Mr. Antigua endorses that he is currently at [facility] receiving rehab therapy.  Mr. Braden confirms he lives alone. States he has limited support with his siblings. States his sister Lattie Haw lives in [place] but is the full time caregiver for their 83 yo mother. His sister Blanch Media lives in [place] but has a husband that requires more of her attention. Other siblings do not live locally. Mr. Gillitzer states his best friend Ascencion Dike lives next door. States Ronalee Belts takes him to HD appointments.  Discussed that Mr. Belarde should consider a life alert device. He states he has been receiving information regarding life alert systems and that he will look into it. Also discussed that he knows of someone who he can pay at a lower cost for caregiver assistance as well. Writer encouraged him to reach out regarding additional care when he returns home. Mr. Krahl states this person used to be the nanny for his daughter years ago and will likely give him a lower price for paid caregiver assistance.   Mr. Shirah states he has facility Medicaid but not community Medicaid. States he will not qualify for community Medicaid. States he makes 2000 dollars a month in disability. Mr. Robello also states he cannot afford ALF placement.   Mr. Schrack confirms his PCP is Dr. Benny Lennert who he thinks very highly of. States "she has helped me a lot."  Mr. Amado endorses that he could benefit from meals assistance. States " I was wondering how I was going to eat after I leave here. I am in no position to cook in the kitchen right now." Mr. Delaney agreeable to [facility] BSW referral for meal assistance and potential transportation needs.  Mr. Camejo also endorses he no longer drinks alcohol has he once did. States he drinks a beer "every  now and then, but nothing like how I used to drink daily." Probation officer commended Mr. Granillo strides in limiting his alcohol consumption.   Discussed [facility] potential referral. Mr. Galeano is agreeable.   Mr. Wunsch has a medical history of CHF, DM, HTN, DM, ESRD, on HD.   Mr. Probus was extremely engaging and pleasant during conversation. He expresses appreciation of any assistance. Mr. Woloszyk states he remains non weight bearing. He also reports that he may need surgery on his leg in the future.   Discussed that writer will maintain contact with him and will make appropriate [facility] referrals upon SNF dc.   After call with member, Probation officer made telephone call to [place] referral line at [phone] to inquire about referral process. No answer. Left voicemail message requesting return call.  Will continue to follow and collaborate while Mr. Scipio remains at [facility].   Marthenia Rolling, MSN-Ed, RN,BSN [place] Acute Care Coordinator [phone] [facility]) [phone]  (Toll free office)

## 2019-07-14 NOTE — Patient Outreach (Signed)
Member assessed for potential [facility] Care Management needs as a benefit of  [place] [employer].  Member is currently receiving rehab therapy at [facility].  Member discussed in weekly telephonic IDT meeting with facility staff, [facility] UM team, and writer.  Facility dc planner contacted APS who advised her that APS closed out Mr. Barrowman case back April 03, 2019 due to Mr. Whitchurch being cognitively intact. Facility reports Mr. Erdmann remains alert and oriented and able to make his own decisions. He lives alone.  Discussed that writer will contact Mr. Zanger to discuss [facility] follow and assess further dc needs. Will contact [facility] to see if they will be an option as well.   Will continue to collaborate with facility and [facility] UM team on member while at [facility].    Marthenia Rolling, MSN-Ed, RN,BSN [place] Acute Care Coordinator [phone] [facility]) [phone]  (Toll free office)

## 2019-07-15 DIAGNOSIS — Z23 Encounter for immunization: Secondary | ICD-10-CM | POA: Diagnosis not present

## 2019-07-15 DIAGNOSIS — D631 Anemia in chronic kidney disease: Secondary | ICD-10-CM | POA: Diagnosis not present

## 2019-07-15 DIAGNOSIS — N2581 Secondary hyperparathyroidism of renal origin: Secondary | ICD-10-CM | POA: Diagnosis not present

## 2019-07-15 DIAGNOSIS — N186 End stage renal disease: Secondary | ICD-10-CM | POA: Diagnosis not present

## 2019-07-15 DIAGNOSIS — Z992 Dependence on renal dialysis: Secondary | ICD-10-CM | POA: Diagnosis not present

## 2019-07-15 DIAGNOSIS — D509 Iron deficiency anemia, unspecified: Secondary | ICD-10-CM | POA: Diagnosis not present

## 2019-07-17 DIAGNOSIS — N2581 Secondary hyperparathyroidism of renal origin: Secondary | ICD-10-CM | POA: Diagnosis not present

## 2019-07-17 DIAGNOSIS — D509 Iron deficiency anemia, unspecified: Secondary | ICD-10-CM | POA: Diagnosis not present

## 2019-07-17 DIAGNOSIS — N186 End stage renal disease: Secondary | ICD-10-CM | POA: Diagnosis not present

## 2019-07-17 DIAGNOSIS — Z992 Dependence on renal dialysis: Secondary | ICD-10-CM | POA: Diagnosis not present

## 2019-07-17 DIAGNOSIS — D631 Anemia in chronic kidney disease: Secondary | ICD-10-CM | POA: Diagnosis not present

## 2019-07-17 DIAGNOSIS — Z23 Encounter for immunization: Secondary | ICD-10-CM | POA: Diagnosis not present

## 2019-07-19 DIAGNOSIS — Z7901 Long term (current) use of anticoagulants: Secondary | ICD-10-CM | POA: Insufficient documentation

## 2019-07-20 ENCOUNTER — Encounter: Payer: Self-pay | Admitting: Adult Health

## 2019-07-20 ENCOUNTER — Non-Acute Institutional Stay (SKILLED_NURSING_FACILITY): Payer: Medicare Other | Admitting: Adult Health

## 2019-07-20 DIAGNOSIS — N2581 Secondary hyperparathyroidism of renal origin: Secondary | ICD-10-CM | POA: Diagnosis not present

## 2019-07-20 DIAGNOSIS — N186 End stage renal disease: Secondary | ICD-10-CM | POA: Diagnosis not present

## 2019-07-20 DIAGNOSIS — S82891S Other fracture of right lower leg, sequela: Secondary | ICD-10-CM | POA: Diagnosis not present

## 2019-07-20 DIAGNOSIS — I82211 Chronic embolism and thrombosis of superior vena cava: Secondary | ICD-10-CM | POA: Diagnosis not present

## 2019-07-20 DIAGNOSIS — E1122 Type 2 diabetes mellitus with diabetic chronic kidney disease: Secondary | ICD-10-CM

## 2019-07-20 DIAGNOSIS — D509 Iron deficiency anemia, unspecified: Secondary | ICD-10-CM | POA: Diagnosis not present

## 2019-07-20 DIAGNOSIS — Z992 Dependence on renal dialysis: Secondary | ICD-10-CM | POA: Diagnosis not present

## 2019-07-20 DIAGNOSIS — E119 Type 2 diabetes mellitus without complications: Secondary | ICD-10-CM | POA: Diagnosis not present

## 2019-07-20 DIAGNOSIS — Z23 Encounter for immunization: Secondary | ICD-10-CM | POA: Diagnosis not present

## 2019-07-20 DIAGNOSIS — Z794 Long term (current) use of insulin: Secondary | ICD-10-CM | POA: Diagnosis not present

## 2019-07-20 DIAGNOSIS — I2782 Chronic pulmonary embolism: Secondary | ICD-10-CM | POA: Diagnosis not present

## 2019-07-20 DIAGNOSIS — D631 Anemia in chronic kidney disease: Secondary | ICD-10-CM | POA: Diagnosis not present

## 2019-07-20 NOTE — Progress Notes (Signed)
Location:    [facility] Room Number: 144/P Place of Service:  SNF (31)   CODE STATUS: Full Code  Allergies  Allergen Reactions  . Codeine Nausea And Vomiting   Chief Complaint  Patient presents with  . 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     HPI:  He is a 58 year old short term rehab patient being seen for the management of his chronic illnesses: right ankle fracture; pulmonary embolism; esrd. He does participate in therapy. He is non adherent with his non-weight bearing status. He denies any uncontrolled pain; has not utilized any percocet in the past 3 days. He denies any changes in his appetite; no insomnia.   Past Medical History:  Diagnosis Date  . 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 Alcohol abuse   . Anemia   . Asthma   . Cellulitis and abscess of right lower extremity 03/01/2017  . CHF (congestive heart failure) ([place])   . Chronic kidney disease    dialysis m,w,f  . Cognitive communication deficit   . COPD (chronic obstructive pulmonary disease) ([place])   . Coronary artery disease   . Diabetes mellitus without complication ([place])   . Edema extremities 03/01/2017   bilateral swelling  . GERD (gastroesophageal reflux disease)   . High cholesterol   . High triglycerides   . Hyperlipemia   . Hypertension   . Hypothyroidism   . Major depressive disorder   . Muscle weakness   . Neuropathy   . OSA (obstructive sleep apnea)   . Other abnormalities of gait and mobility   . Peripheral vascular disease ([place])   . Pneumonia   . Sleep apnea    can't use cpap d/t feelings of suffocation    Past Surgical History:  Procedure Laterality Date  .  A/V FISTULAGRAM Right 08/27/2017   Procedure: A/V FISTULAGRAM;  Surgeon: Katha Cabal, MD;  Location: [facility];  Service: Cardiovascular;  Laterality: Right;  . A/V FISTULAGRAM Right 11/04/2018   Procedure: A/V FISTULAGRAM;  Surgeon: Katha Cabal, MD;  Location: [facility];  Service: Cardiovascular;  Laterality: Right;  . A/V SHUNT INTERVENTION N/A 06/23/2018   Procedure: A/V SHUNT INTERVENTION;  Surgeon: Algernon Huxley, MD;  Location: [facility];  Service: Cardiovascular;  Laterality: N/A;  . APPENDECTOMY  2010  . AV FISTULA PLACEMENT Right 06/14/2017   Procedure: ARTERIOVENOUS (AV) FISTULA CREATION WRIST;  Surgeon: Katha Cabal, MD;  Location: [facility];  Service: Vascular;  Laterality: Right;  . DIALYSIS/PERMA CATHETER INSERTION N/A 03/05/2017   Procedure: Dialysis/Perma Catheter Insertion;  Surgeon: Katha Cabal, MD;  Location: [facility];  Service: Cardiovascular;  Laterality: N/A;  . DIALYSIS/PERMA CATHETER INSERTION N/A 09/20/2017   Procedure: DIALYSIS/PERMA CATHETER INSERTION;  Surgeon: Katha Cabal, MD;  Location: [facility];  Service: Cardiovascular;  Laterality: N/A;  . DIALYSIS/PERMA CATHETER INSERTION N/A 01/26/2019   Procedure: DIALYSIS/PERMA CATHETER INSERTION/Exchange;  Surgeon: Algernon Huxley, MD;  Location: [facility];  Service: Cardiovascular;  Laterality: N/A;  . DIALYSIS/PERMA CATHETER INSERTION N/A 01/27/2019   Procedure: DIALYSIS/PERMA CATHETER INSERTION;  Surgeon: Delana Meyer,  Dolores Lory, MD;  Location: [facility];  Service: Cardiovascular;  Laterality: N/A;  . EXCHANGE OF A DIALYSIS CATHETER  03/29/2017   Procedure: Exchange Of A Dialysis Catheter;  Surgeon: Katha Cabal, MD;  Location: [facility];  Service: Cardiovascular;;  . IRRIGATION AND DEBRIDEMENT FOOT Right 03/01/2017   Procedure: IRRIGATION AND DEBRIDEMENT FOOT;  Surgeon: Albertine Patricia, DPM;  Location: [facility];   Service: Podiatry;  Laterality: Right;  application of wound vac  . ORIF ANKLE FRACTURE Right 06/19/2019   Procedure: OPEN REDUCTION INTERNAL FIXATION (ORIF) ANKLE FRACTURE;  Surgeon: Altamese Nashua, MD;  Location: [facility];  Service: Orthopedics;  Laterality: Right;  . PERIPHERAL VASCULAR THROMBECTOMY Right 06/18/2018   Procedure: PERIPHERAL VASCULAR THROMBECTOMY;  Surgeon: Algernon Huxley, MD;  Location: [facility];  Service: Cardiovascular;  Laterality: Right;  . REPAIR NON-UNION ULNA Right 06/19/2019   Procedure: Repair Non-Union right tibia;  Surgeon: Altamese [place], MD;  Location: [facility];  Service: Orthopedics;  Laterality: Right;  Marland Kitchen VASCULAR ACCESS DEVICE INSERTION Right 01/30/2019   Procedure: INSERTION OF HERO VASCULAR ACCESS DEVICE;  Surgeon: Katha Cabal, MD;  Location: [facility];  Service: Vascular;  Laterality: Right;    Social History   Socioeconomic History  . Marital status: Divorced    Spouse name: Not on file  . Number of children: 1  . Years of education: Not on file  . Highest education level: Not on file  Occupational History  . Occupation: disability  Social Needs  . Financial resource strain: Not very hard  . Food insecurity    Worry: Never true    Inability: Never true  . Transportation needs    Medical: No    Non-medical: No  Tobacco Use  . Smoking status: Former Research scientist (life sciences)  . Smokeless tobacco: Current User    Types: Snuff  Substance and Sexual Activity  . Alcohol use: Yes    Comment: up until 01/2017 was heavy drinker...4 40 oz qd     twice a week drinker  . Drug use: No  . Sexual activity: Never  Lifestyle  . Physical activity    Days per week: 0 days    Minutes per session: 0 min  . Stress: Not at all  Relationships  . Social connections    Talks on phone: More than three times a week    Gets together: More than three times a week    Attends religious service: Never    Active member of club or organization: No    Attends meetings of clubs  or organizations: Never    Relationship status: Divorced  . Intimate partner violence    Fear of current or ex partner: No    Emotionally abused: No    Physically abused: No    Forced sexual activity: No  Other Topics Concern  . Not on file  Social History Narrative  . Not on file   Family History  Problem Relation Age of Onset  . CAD Father   . Heart disease Father       VITAL SIGNS BP 137/84   Pulse 80   Temp 98.1 F (36.7 C) (Oral)   Resp 20   Ht 5\' 11"  (1.803 m)   Wt 234 lb 9.6 oz (106.4 kg)   BMI 32.72 kg/m   Outpatient Encounter Medications as of 07/20/2019  Medication Sig  . acetaminophen (TYLENOL) 325 MG tablet Take 2 tablets (650 mg total) by mouth every 12 (twelve)  hours.  . calcium acetate (PHOSLO) 667 MG capsule Take 2 capsules (1,334 mg total) by mouth 3 (three) times daily with meals.  . furosemide (LASIX) 80 MG tablet Take 80 mg by mouth daily.  Marland Kitchen gabapentin (NEURONTIN) 300 MG capsule Take 300 mg by mouth at bedtime.  . insulin aspart (NOVOLOG FLEXPEN) 100 UNIT/ML FlexPen Per Sliding Scale;  If Blood Sugar is less than 60, call MD. If Blood Sugar is 150 to 200, give 2 Units. If Blood Sugar is 201 to 250, give 4 Units. If Blood Sugar is 251 to 300, give 6 Units. If Blood Sugar is 301 to 350, give 8 Units. If Blood Sugar is 351 to 400, give 10 Units. If Blood Sugar is greater than 400, call MD. subcutaneous  With Meals  . insulin aspart (NOVOLOG) 100 UNIT/ML injection Inject 6 Units into the skin 3 (three) times daily with meals.  . Insulin Detemir (LEVEMIR FLEXTOUCH) 100 UNIT/ML Pen Inject 14 Units into the skin at bedtime.  . metoprolol tartrate (LOPRESSOR) 25 MG tablet Take 0.5 tablets (12.5 mg total) by mouth 2 (two) times daily.  . midodrine (PROAMATINE) 10 MG tablet Take 1 tablet (10 mg total) by mouth 2 (two) times daily with a meal.  . mirtazapine (REMERON) 15 MG tablet Take 15 mg by mouth at bedtime.   . mometasone-formoterol (DULERA) 100-5  MCG/ACT AERO Inhale 2 puffs into the lungs every morning.  . NON FORMULARY Diet: Regular, NAS, Consistent Carbohydrate  . NON FORMULARY Fluid restriction: 1200cc a day Special Instructions: Special Instructions: 500cc for days, Evening 500cc and night 200cc Every Shift Day, Evening, Night  . omeprazole (PRILOSEC OTC) 20 MG tablet Take 1 tablet (20 mg total) by mouth daily.  . ondansetron (ZOFRAN) 4 MG tablet Take 1 tablet (4 mg total) by mouth every 6 (six) hours as needed for nausea.  Marland Kitchen oxyCODONE-acetaminophen (PERCOCET/ROXICET) 5-325 MG tablet Take 2 tablets by mouth every 8 (eight) hours as needed for severe pain.  Marland Kitchen oxyCODONE-acetaminophen (PERCOCET/ROXICET) 5-325 MG tablet Take 1 tablet by mouth every 8 (eight) hours as needed for severe pain.  Marland Kitchen PROAIR HFA 108 (90 Base) MCG/ACT inhaler Inhale 2 puffs into the lungs every 4 (four) hours as needed for wheezing or shortness of breath.   . sodium bicarbonate 650 MG tablet Take 1 tablet (650 mg total) by mouth 2 (two) times daily.  Marland Kitchen spironolactone (ALDACTONE) 25 MG tablet Take 25 mg by mouth daily.  Marland Kitchen warfarin (COUMADIN) 5 MG tablet Special Instructions: Take 1.5 tabs (7.5 mg daily except Wednesday) Use appropriate precautions for receiving, handling, administration, and disposal. Gloves (single) should be worn during receiving, unpacking, and placing in storage. NIOSH recommends single gloving for administration of intact tablets or capsules. Once A Day on Sun, Mon, Tue, Thu, Fri, Sat   No facility-administered encounter medications on file as of 07/20/2019.      SIGNIFICANT DIAGNOSTIC EXAMS     PREVIOUS;   06-16-19: right ankle x-ray" Oblique fracture through the distal tibial metaphysis.   06-16-19: ct of head: 1. No acute intracranial abnormality 2. Chronic small vessel ischemic change and brain atrophy.  06-16-19: ct of right ankle: 1. Remote appearing ununited distal tibia fractures. Possible acute on chronic fractures. 2.  Healed distal fibular fracture. 3. Intact ankle mortise. No mid or hindfoot fractures are identified. 4. Extensive small vessel disease.  NO NEW EXAMS.   LABS REVIEWED PREVIOUS  06-16-19: wbc 8.6; hgb 11.8; hct 37.9 mcv 96.5 plt 152; glucose  81; bun 23; creat 4.38; k+ 4.9; ca 8.7; liver normal albumin 2.9  06-19-19: wbc 5.5; hgb 10.6; hct 33.6; mcv 86.4; plt 143; vit D 25.7; hgb a1c 6.1 06-22-19: wbc 4.1; hgb 8.3; hct 27.4; mcv 89.8 ;plt 159; glucose 222; bun 29; creat 4.70; k+ 3.9; na++ 133; ca 8.1; phos 2.0 albumin 2.0 06-26-19: wbc 4.8; hgb 9.2; hct 28.7; mcv 87.8 ;plt 240; glucose 304; bun 33; creat 4.19; k+ 3.9; an++ 131; ca 7.9 phos 3.5; albumin 2.3 06-29-19: INR 2.4  07-07-19: INR; 1.6  07-14-19: INR 2.7   NO NEW LABS.   Review of Systems  Constitutional: Negative for malaise/fatigue.  Respiratory: Negative for cough and shortness of breath.   Cardiovascular: Negative for chest pain, palpitations and leg swelling.  Gastrointestinal: Negative for abdominal pain, constipation and heartburn.  Musculoskeletal: Negative for back pain, joint pain and myalgias.  Skin: Negative.   Neurological: Negative for dizziness.  Psychiatric/Behavioral: The patient is not nervous/anxious.     Physical Exam Constitutional:      General: He is not in acute distress.    Appearance: He is well-developed. He is obese. He is not diaphoretic.  Neck:     Musculoskeletal: Neck supple.     Thyroid: No thyromegaly.  Cardiovascular:     Rate and Rhythm: Normal rate and regular rhythm.     Pulses: Normal pulses.     Heart sounds: Normal heart sounds.  Pulmonary:     Effort: Pulmonary effort is normal. No respiratory distress.     Breath sounds: Normal breath sounds.  Abdominal:     General: Bowel sounds are normal. There is no distension.     Palpations: Abdomen is soft.     Tenderness: There is no abdominal tenderness.  Musculoskeletal:     Right lower leg: No edema.     Left lower leg: No edema.      Comments: Is able to move all extremities Has boot to right lower leg .  06-19-19 Status post right ankle  ORIF       Lymphadenopathy:     Cervical: No cervical adenopathy.  Skin:    General: Skin is warm and dry.     Comments: Right upper arm a/v fistula: + thrill + bruit      Neurological:     Mental Status: He is alert and oriented to person, place, and time. Mental status is at baseline.  Psychiatric:        Mood and Affect: Mood normal.      ASSESSMENT/ PLAN:  TODAY;   1. Closed right ankle fracture sequela is stable is non-weight bearing for total 8 weeks; will continue therapy as directed; will follow up with orthopedics; will continue tylenol 650 mg twice daily and will stop percocet due to non-use  2. 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   3. End stage renal disease on dialysis due to type 2 diabetes mellitus: is stable bun 33; creat 4.19; in on hemodialysis three days per week; will continue phoslo 1334 mg with meals and sodium bicarbonate 650 gm twice daily is followed by nephrology.    PREVIOUS    4. OSA (obstructive sleep apnea) is without change; does not use CPAP  5. Type 2 diabetes mellitus with hypertension and end stage renal disease on dialysis: is stable hgb a1c 6.1 is stable will  continue levemir 14 units nightly and novolog 6 units with meals novolog SSI: 150-200: 2 units; 201-250: 4 units; 251-300: 6 units; 301-350: 8 units; 351-400: 10 units   6.  Anemia due to chronic renal disease on chronic dialysis: is stable hgb 9.2 will monitor   7. Morbid obesity: is without change: BMI 32.72 will monitor   8.  Major depression recurrent chronic: is stable will continue remeron 15 mg nightly   9 Diabetic peripheral neuropathy due to type 2 diabetes mellitus: is stable will continue neurontin  300 mg nightly and tylenol 650 mg twice daily   10 Hypertension associated with stage 5 chronic kidney disease due to type 2 diabetes mellitus: is stable b/p  146/80 will continue lopressor 12.5 mg twice daily and aldactone 25 mg daily   11. Chronic diastolic congestive heart failure is stable will continue aldactone 25 mg daily lasix 80 mg daily and lopressor 12.5 mg twice daily   12. Hemodialysis associated hypotension: is stable b/p 137/84 will continue midodrine 10 mg twice daily due to his dialysis  13. COPD mild is stable will continue dulera 100-5 mcg 2 puffs daily; pro-air 3 puffs every 4 hours as needed   14. GERD without esophagitis: is stable will continue prilosec 20 mg daily      MD is aware of resident's narcotic use and is in agreement with current plan of care. We will attempt to wean resident as appropriate.  Ok Edwards NP [facility]  Contact [phone] Monday through Friday 8am- 5pm  After hours call [phone]

## 2019-07-21 ENCOUNTER — Other Ambulatory Visit: Payer: Self-pay | Admitting: *Deleted

## 2019-07-21 NOTE — Patient Outreach (Signed)
Member assessed for potential [facility] Care Management needs as a benefit of  [place] Medicare.  Member is currently receiving rehab therapy at [facility].  Member discussed in weekly telephonic IDT meeting with facility staff, [facility] UM team, and writer.  Facility reports Mr. Crus has not been adhering to his non weight bearing status. He is using sliding board for transfers. He will have tentative dc date for next Tuesday.   Discussed that writer will follow up with him again about disposition plans. Writer will also make [facility] team aware of Mr. Spurlock tentative dc date.  Will plan to make referrals for [facility] Care Management team as well for meals and transportation assistance. Mr. Kolk previously indicated his neighbor takes him to appointments. However, transportation will need to be addressed again post SNF dc.  Writer previously spoke with [employer] [facility] staff who reports Mr. Badertscher will need a covid test prior to their involvement. Paramedicine will provide additional support and resource regarding medication adherence and compliance and chronic disease management. Writer relayed this info to [facility] discharge planner.   Will follow up with Mr. Kattner and will continue to collaborate with facility and community partners on member while at [facility].   Marthenia Rolling, MSN-Ed, RN,BSN [place] Acute Care Coordinator [phone] [facility]) [phone]  (Toll free office)

## 2019-07-22 ENCOUNTER — Other Ambulatory Visit: Payer: Self-pay | Admitting: *Deleted

## 2019-07-22 DIAGNOSIS — S82871K Displaced pilon fracture of right tibia, subsequent encounter for closed fracture with nonunion: Secondary | ICD-10-CM | POA: Diagnosis not present

## 2019-07-22 NOTE — Patient Outreach (Signed)
Member assessed for potential [facility] Care Management needs as a benefit of [place] [employer].  Voicemail message left for [facility] program to make aware of Mr. Scheffler anticipated discharge from [facility] SNF for next Tuesday 06/28/19.  Will continue to collaborate with community partners and facility on member while at [facility].   Will plan outreach to Mr. Moronta to discuss further disposition plans as previously discussed with him.   Marthenia Rolling, MSN-Ed, RN,BSN [place] Acute Care Coordinator [phone] [facility]) [phone]  (Toll free office)

## 2019-07-23 ENCOUNTER — Non-Acute Institutional Stay (SKILLED_NURSING_FACILITY): Payer: Medicare Other | Admitting: Adult Health

## 2019-07-23 ENCOUNTER — Encounter: Payer: Self-pay | Admitting: Adult Health

## 2019-07-23 ENCOUNTER — Other Ambulatory Visit: Payer: Self-pay | Admitting: Adult Health

## 2019-07-23 ENCOUNTER — Other Ambulatory Visit: Payer: Self-pay | Admitting: *Deleted

## 2019-07-23 DIAGNOSIS — N185 Chronic kidney disease, stage 5: Secondary | ICD-10-CM

## 2019-07-23 DIAGNOSIS — S82891S Other fracture of right lower leg, sequela: Secondary | ICD-10-CM

## 2019-07-23 DIAGNOSIS — D631 Anemia in chronic kidney disease: Secondary | ICD-10-CM | POA: Diagnosis not present

## 2019-07-23 DIAGNOSIS — D509 Iron deficiency anemia, unspecified: Secondary | ICD-10-CM | POA: Diagnosis not present

## 2019-07-23 DIAGNOSIS — N186 End stage renal disease: Secondary | ICD-10-CM | POA: Diagnosis not present

## 2019-07-23 DIAGNOSIS — N2581 Secondary hyperparathyroidism of renal origin: Secondary | ICD-10-CM | POA: Diagnosis not present

## 2019-07-23 DIAGNOSIS — I120 Hypertensive chronic kidney disease with stage 5 chronic kidney disease or end stage renal disease: Secondary | ICD-10-CM

## 2019-07-23 DIAGNOSIS — E1142 Type 2 diabetes mellitus with diabetic polyneuropathy: Secondary | ICD-10-CM | POA: Diagnosis not present

## 2019-07-23 DIAGNOSIS — E1122 Type 2 diabetes mellitus with diabetic chronic kidney disease: Secondary | ICD-10-CM | POA: Diagnosis not present

## 2019-07-23 DIAGNOSIS — Z992 Dependence on renal dialysis: Secondary | ICD-10-CM | POA: Diagnosis not present

## 2019-07-23 DIAGNOSIS — Z23 Encounter for immunization: Secondary | ICD-10-CM | POA: Diagnosis not present

## 2019-07-23 MED ORDER — ONDANSETRON HCL 4 MG PO TABS: 4.0000 mg | ORAL_TABLET | Freq: Four times a day (QID) | ORAL | 0 refills | Status: DC | PRN

## 2019-07-23 MED ORDER — WARFARIN SODIUM 5 MG PO TABS: 7.5000 mg | ORAL_TABLET | Freq: Every day | ORAL | 0 refills | Status: DC

## 2019-07-23 MED ORDER — SODIUM BICARBONATE 650 MG PO TABS: 650.0000 mg | ORAL_TABLET | Freq: Two times a day (BID) | ORAL | 0 refills | Status: DC

## 2019-07-23 MED ORDER — NOVOLOG FLEXPEN 100 UNIT/ML ~~LOC~~ SOPN: 6.0000 [IU] | PEN_INJECTOR | Freq: Three times a day (TID) | SUBCUTANEOUS | 0 refills | Status: DC

## 2019-07-23 MED ORDER — SPIRONOLACTONE 25 MG PO TABS: 25.0000 mg | ORAL_TABLET | Freq: Every day | ORAL | 0 refills | Status: DC

## 2019-07-23 MED ORDER — CALCIUM ACETATE (PHOS BINDER) 667 MG PO CAPS: 1334.0000 mg | ORAL_CAPSULE | Freq: Three times a day (TID) | ORAL | 0 refills | Status: DC

## 2019-07-23 MED ORDER — MIRTAZAPINE 15 MG PO TABS: 15.0000 mg | ORAL_TABLET | Freq: Every day | ORAL | 0 refills | Status: AC

## 2019-07-23 MED ORDER — DULERA 100-5 MCG/ACT IN AERO: 2.0000 | INHALATION_SPRAY | RESPIRATORY_TRACT | 0 refills | Status: AC

## 2019-07-23 MED ORDER — LEVEMIR FLEXTOUCH 100 UNIT/ML ~~LOC~~ SOPN: 14.0000 [IU] | PEN_INJECTOR | Freq: Every day | SUBCUTANEOUS | 0 refills | Status: DC

## 2019-07-23 MED ORDER — MIDODRINE HCL 10 MG PO TABS: 10.0000 mg | ORAL_TABLET | Freq: Two times a day (BID) | ORAL | 1 refills | Status: DC

## 2019-07-23 MED ORDER — GABAPENTIN 300 MG PO CAPS: 300.0000 mg | ORAL_CAPSULE | Freq: Every day | ORAL | 0 refills | Status: AC

## 2019-07-23 MED ORDER — PROAIR HFA 108 (90 BASE) MCG/ACT IN AERS: 2.0000 | INHALATION_SPRAY | RESPIRATORY_TRACT | 0 refills | Status: DC | PRN

## 2019-07-23 MED ORDER — METOPROLOL TARTRATE 25 MG PO TABS: 12.5000 mg | ORAL_TABLET | Freq: Two times a day (BID) | ORAL | 0 refills | Status: DC

## 2019-07-23 MED ORDER — FUROSEMIDE 80 MG PO TABS: 80.0000 mg | ORAL_TABLET | Freq: Every day | ORAL | 0 refills | Status: AC

## 2019-07-23 NOTE — Patient Outreach (Addendum)
Notification received from [facility] SNF discharge planner indicating Steven [name] is discharging home today instead of Tuesday. Also facility dc planner reports Steven [name] tested negative for covid. He will have home health with [facility].  Telephone call made to [facility] with [place] program at [phone]. Made her aware of Steven [name] discharge to home today and that he tested negative for covid. Lattie Haw states they will follow up with him.  Telephone call made to Steven [name]. He states he is in the car on the way home from SNF. Very brief conversation.  Will make referral to [facility] for complex case managment and [facility] Management social worker for meal assistance. Please see writer's notes from 07/14/19 for additional details.  Steven [name] has a medical history of CHF, DM, HTN, DM, ESRD, on HD on Mondays, Wednesdays, and Fridays.   Addendum: Steven [name] called Probation officer back. He confirmed that he does have caregiver assistance thru the family friend he mentioned in previous conversation. States "she is hear now." He did not go into details about the number of hours she will assist. However, it is fortunate that he was able to secure paid caregiver assistance.  Marthenia Rolling, MSN-Ed, RN,BSN [place] Acute Care Coordinator [phone] [facility]) [phone]  (Toll free office)

## 2019-07-23 NOTE — Progress Notes (Signed)
Location:    [facility] Room Number: 144 Place of Service:  SNF (31)    CODE STATUS: full code   Allergies  Allergen Reactions  . Codeine Nausea And Vomiting    Chief Complaint  Patient presents with  . Discharge Note    HPI:  He is being discharged to home with home health for pt/ot. He will need a wheelchair. He will need his prescriptions and will need to follow up with his medical provider. He will need INR 07-28-19. He had been hospitalized for a right ankle fracture. He was admitted to this facility for short term rehab. He continues to be nonweight bearing for a total of 8 weeks. He has been unable to adhere to his nonweight bearing status.    Past Medical History:  Diagnosis Date  . 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 Alcohol abuse   . Anemia   . Asthma   . Cellulitis and abscess of right lower extremity 03/01/2017  . CHF (congestive heart failure) ([place])   . Chronic kidney disease    dialysis m,w,f  . Cognitive communication deficit   . COPD (chronic obstructive pulmonary disease) ([place])   . Coronary artery disease   . Diabetes mellitus without complication ([place])   . Edema extremities 03/01/2017   bilateral swelling  . GERD (gastroesophageal reflux disease)   . High cholesterol   . High triglycerides   . Hyperlipemia   . Hypertension   . Hypothyroidism   . Major depressive disorder   . Muscle weakness   . Neuropathy   . OSA (obstructive sleep apnea)   . Other abnormalities of gait and mobility   . Peripheral vascular disease ([place])   . Pneumonia   . Sleep apnea    can't use cpap d/t feelings of suffocation    Past Surgical History:  Procedure Laterality Date  . A/V FISTULAGRAM Right 08/27/2017   Procedure: A/V FISTULAGRAM;  Surgeon: Katha Cabal, MD;  Location: [place] [facility];  Service: Cardiovascular;  Laterality: Right;  . A/V FISTULAGRAM Right 11/04/2018   Procedure: A/V FISTULAGRAM;  Surgeon:  Katha Cabal, MD;  Location: [place] [facility];  Service: Cardiovascular;  Laterality: Right;  . A/V SHUNT INTERVENTION N/A 06/23/2018   Procedure: A/V SHUNT INTERVENTION;  Surgeon: Algernon Huxley, MD;  Location: [place] [facility];  Service: Cardiovascular;  Laterality: N/A;  . APPENDECTOMY  2010  . AV FISTULA PLACEMENT Right 06/14/2017   Procedure: ARTERIOVENOUS (AV) FISTULA CREATION WRIST;  Surgeon: Katha Cabal, MD;  Location: [facility];  Service: Vascular;  Laterality: Right;  . DIALYSIS/PERMA CATHETER INSERTION N/A 03/05/2017   Procedure: Dialysis/Perma Catheter Insertion;  Surgeon: Katha Cabal, MD;  Location: [place] [facility];  Service: Cardiovascular;  Laterality: N/A;  . DIALYSIS/PERMA CATHETER INSERTION N/A 09/20/2017   Procedure: DIALYSIS/PERMA CATHETER INSERTION;  Surgeon: Katha Cabal, MD;  Location: [place] [facility];  Service: Cardiovascular;  Laterality: N/A;  . DIALYSIS/PERMA CATHETER INSERTION N/A 01/26/2019   Procedure: DIALYSIS/PERMA CATHETER INSERTION/Exchange;  Surgeon: Algernon Huxley, MD;  Location: [place] [facility];  Service: Cardiovascular;  Laterality: N/A;  . DIALYSIS/PERMA CATHETER INSERTION N/A 01/27/2019   Procedure: DIALYSIS/PERMA CATHETER INSERTION;  Surgeon: Katha Cabal, MD;  Location: [place] [facility];  Service: Cardiovascular;  Laterality: N/A;  . EXCHANGE OF A DIALYSIS CATHETER  03/29/2017   Procedure: Exchange Of A Dialysis Catheter;  Surgeon: Hortencia Pilar  G, MD;  Location: [place] [facility];  Service: Cardiovascular;;  . IRRIGATION AND DEBRIDEMENT FOOT Right 03/01/2017   Procedure: IRRIGATION AND DEBRIDEMENT FOOT;  Surgeon: Albertine Patricia, DPM;  Location: [facility];  Service: Podiatry;  Laterality: Right;  application of wound vac  . ORIF ANKLE FRACTURE Right 06/19/2019   Procedure: OPEN REDUCTION INTERNAL FIXATION (ORIF) ANKLE FRACTURE;  Surgeon: Altamese Enoree, MD;  Location: [place];  Service: Orthopedics;   Laterality: Right;  . PERIPHERAL VASCULAR THROMBECTOMY Right 06/18/2018   Procedure: PERIPHERAL VASCULAR THROMBECTOMY;  Surgeon: Algernon Huxley, MD;  Location: [place] [facility];  Service: Cardiovascular;  Laterality: Right;  . REPAIR NON-UNION ULNA Right 06/19/2019   Procedure: Repair Non-Union right tibia;  Surgeon: Altamese Holmes, MD;  Location: [place];  Service: Orthopedics;  Laterality: Right;  Marland Kitchen VASCULAR ACCESS DEVICE INSERTION Right 01/30/2019   Procedure: INSERTION OF HERO VASCULAR ACCESS DEVICE;  Surgeon: Katha Cabal, MD;  Location: [facility];  Service: Vascular;  Laterality: Right;    Social History   Socioeconomic History  . Marital status: Divorced    Spouse name: Not on file  . Number of children: 1  . Years of education: Not on file  . Highest education level: Not on file  Occupational History  . Occupation: disability  Social Needs  . Financial resource strain: Not very hard  . Food insecurity    Worry: Never true    Inability: Never true  . Transportation needs    Medical: No    Non-medical: No  Tobacco Use  . Smoking status: Former Research scientist (life sciences)  . Smokeless tobacco: Current User    Types: Snuff  Substance and Sexual Activity  . Alcohol use: Yes    Comment: up until 01/2017 was heavy drinker...4 40 oz qd     twice a week drinker  . Drug use: No  . Sexual activity: Never  Lifestyle  . Physical activity    Days per week: 0 days    Minutes per session: 0 min  . Stress: Not at all  Relationships  . Social connections    Talks on phone: More than three times a week    Gets together: More than three times a week    Attends religious service: Never    Active member of club or organization: No    Attends meetings of clubs or organizations: Never    Relationship status: Divorced  . Intimate partner violence    Fear of current or ex partner: No    Emotionally abused: No    Physically abused: No    Forced sexual activity: No  Other Topics Concern  . Not on file   Social History Narrative  . Not on file   Family History  Problem Relation Age of Onset  . CAD Father   . Heart disease Father     VITAL SIGNS BP 132/78   Pulse 80   Temp 97.8 F (36.6 C)   Resp 18   Ht 5\' 11"  (1.803 m)   Wt 234 lb (106.1 kg)   BMI 32.64 kg/m   Patient's Medications  New Prescriptions   No medications on file  Previous Medications   ACETAMINOPHEN (TYLENOL) 325 MG TABLET    Take 2 tablets (650 mg total) by mouth every 12 (twelve) hours.   CALCIUM ACETATE (PHOSLO) 667 MG CAPSULE    Take 2 capsules (1,334 mg total) by mouth 3 (three) times daily with meals.   FUROSEMIDE (LASIX) 80 MG TABLET  Take 1 tablet (80 mg total) by mouth daily.   GABAPENTIN (NEURONTIN) 300 MG CAPSULE    Take 1 capsule (300 mg total) by mouth at bedtime.   INSULIN ASPART (NOVOLOG FLEXPEN) 100 UNIT/ML FLEXPEN    Inject 6-16 Units into the skin 3 (three) times daily with meals. Per Sliding Scale; If Blood Sugar is less than 60, call MD. If Blood Sugar is 150 to 200, give 2 Units. If Blood Sugar is 201 to 250, give 4 Units. If Blood Sugar is 251 to 300, give 6 Units. If Blood Sugar is 301 to 350, give 8 Units. If Blood Sugar is 351 to 400, give 10 Units. If Blood Sugar is greater than 400, call MD. subcutaneous With Meals GIVE 6 UNITS WITH MEALS IN ADDITION TO SLIDING SCALE   INSULIN DETEMIR (LEVEMIR FLEXTOUCH) 100 UNIT/ML PEN    Inject 14 Units into the skin at bedtime.   METOPROLOL TARTRATE (LOPRESSOR) 25 MG TABLET    Take 0.5 tablets (12.5 mg total) by mouth 2 (two) times daily.   MIDODRINE (PROAMATINE) 10 MG TABLET    Take 1 tablet (10 mg total) by mouth 2 (two) times daily with a meal.   MIRTAZAPINE (REMERON) 15 MG TABLET    Take 1 tablet (15 mg total) by mouth at bedtime.   MOMETASONE-FORMOTEROL (DULERA) 100-5 MCG/ACT AERO    Inhale 2 puffs into the lungs every morning.   NON FORMULARY    Diet: Regular, NAS, Consistent Carbohydrate   NON FORMULARY    Fluid restriction: 1200cc a day  Special Instructions: Special Instructions: 500cc for days, Evening 500cc and night 200cc Every Shift Day, Evening, Night   OMEPRAZOLE (PRILOSEC OTC) 20 MG TABLET    Take 1 tablet (20 mg total) by mouth daily.   ONDANSETRON (ZOFRAN) 4 MG TABLET    Take 1 tablet (4 mg total) by mouth every 6 (six) hours as needed for nausea.   PROAIR HFA 108 (90 BASE) MCG/ACT INHALER    Inhale 2 puffs into the lungs every 4 (four) hours as needed for wheezing or shortness of breath.   SODIUM BICARBONATE 650 MG TABLET    Take 1 tablet (650 mg total) by mouth 2 (two) times daily.   SPIRONOLACTONE (ALDACTONE) 25 MG TABLET    Take 1 tablet (25 mg total) by mouth daily.   WARFARIN (COUMADIN) 5 MG TABLET    Take 1.5 tablets (7.5 mg total) by mouth daily at 6 PM. Special Instructions: Take 1.5 tabs (7.5 mg daily except Wednesday) Use appropriate precautions for receiving, handling, administration, and disposal. Gloves (single) should be worn during receiving, unpacking, and placing in storage. NIOSH recommends single gloving for administration of intact tablets or capsules. Once A Day on Sun, Mon, Tue, Thu, Fri, Sat  Modified Medications   No medications on file  Discontinued Medications   No medications on file     SIGNIFICANT DIAGNOSTIC EXAMS   PREVIOUS;   06-16-19: right ankle x-ray" Oblique fracture through the distal tibial metaphysis.   06-16-19: ct of head: 1. No acute intracranial abnormality 2. Chronic small vessel ischemic change and brain atrophy.  06-16-19: ct of right ankle: 1. Remote appearing ununited distal tibia fractures. Possible acute on chronic fractures. 2. Healed distal fibular fracture. 3. Intact ankle mortise. No mid or hindfoot fractures are identified. 4. Extensive small vessel disease.  NO NEW EXAMS.   LABS REVIEWED PREVIOUS  06-16-19: wbc 8.6; hgb 11.8; hct 37.9 mcv 96.5 plt 152; glucose 81; bun  23; creat 4.38; k+ 4.9; ca 8.7; liver normal albumin 2.9  06-19-19: wbc 5.5; hgb  10.6; hct 33.6; mcv 86.4; plt 143; vit D 25.7; hgb a1c 6.1 06-22-19: wbc 4.1; hgb 8.3; hct 27.4; mcv 89.8 ;plt 159; glucose 222; bun 29; creat 4.70; k+ 3.9; na++ 133; ca 8.1; phos 2.0 albumin 2.0 06-26-19: wbc 4.8; hgb 9.2; hct 28.7; mcv 87.8 ;plt 240; glucose 304; bun 33; creat 4.19; k+ 3.9; an++ 131; ca 7.9 phos 3.5; albumin 2.3 06-29-19: INR 2.4  07-07-19: INR; 1.6  07-14-19: INR 2.7   NO NEW LABS.    Review of Systems  Constitutional: Negative for malaise/fatigue.  Respiratory: Negative for cough and shortness of breath.   Cardiovascular: Negative for chest pain, palpitations and leg swelling.  Gastrointestinal: Negative for abdominal pain, constipation and heartburn.  Musculoskeletal: Negative for back pain, joint pain and myalgias.  Skin: Negative.   Neurological: Negative for dizziness.  Psychiatric/Behavioral: The patient is not nervous/anxious.    Physical Exam Constitutional:      General: He is not in acute distress.    Appearance: He is well-developed. He is obese. He is not diaphoretic.  Neck:     Musculoskeletal: Neck supple.     Thyroid: No thyromegaly.  Cardiovascular:     Rate and Rhythm: Normal rate and regular rhythm.     Pulses: Normal pulses.     Heart sounds: Normal heart sounds.  Pulmonary:     Effort: Pulmonary effort is normal. No respiratory distress.     Breath sounds: Normal breath sounds.  Abdominal:     General: Bowel sounds are normal. There is no distension.     Palpations: Abdomen is soft.     Tenderness: There is no abdominal tenderness.  Musculoskeletal:     Right lower leg: No edema.     Left lower leg: No edema.     Comments: Is able to move all extremities Has boot to right lower leg .  Continues to be non-weight bearing  06-19-19 Status post right ankle  ORIF        Lymphadenopathy:     Cervical: No cervical adenopathy.  Skin:    General: Skin is warm and dry.     Comments: Right upper arm a/v fistula: + thrill + bruit        Neurological:     Mental Status: He is alert and oriented to person, place, and time.  Psychiatric:        Mood and Affect: Mood normal.      ASSESSMENT/ PLAN:   Patient is being discharged with the following home health services:  Pt/ot to evaluate and treat as indicated for gait balance strength adl training  Patient is being discharged with the following durable medical equipment:  Light weight wheelchair to allow him to maintain his current level of independence with his adls which cannot be achieved with a walker can self propel. Is non weight bearing to right lower extremity.   Patient has been advised to f/u with their PCP in 1-2 weeks to bring them up to date on their rehab stay.  Social services at facility was responsible for arranging this appointment.  Pt was provided with a 30 day supply of prescriptions for medications and refills must be obtained from their PCP.  For controlled substances, a more limited supply may be provided adequate until PCP appointment only.   A 30 day supply of his prescription medications have been sent to [facility] on [address]  Time spent with patient: 40 minutes: medications; dme; home health.    Ok Edwards NP [facility]  Contact [phone] Monday through Friday 8am- 5pm  After hours call [phone]

## 2019-07-24 ENCOUNTER — Other Ambulatory Visit: Payer: Self-pay

## 2019-07-24 DIAGNOSIS — Z992 Dependence on renal dialysis: Secondary | ICD-10-CM | POA: Diagnosis not present

## 2019-07-24 DIAGNOSIS — Z23 Encounter for immunization: Secondary | ICD-10-CM | POA: Diagnosis not present

## 2019-07-24 DIAGNOSIS — N2581 Secondary hyperparathyroidism of renal origin: Secondary | ICD-10-CM | POA: Diagnosis not present

## 2019-07-24 DIAGNOSIS — N186 End stage renal disease: Secondary | ICD-10-CM | POA: Diagnosis not present

## 2019-07-24 DIAGNOSIS — D509 Iron deficiency anemia, unspecified: Secondary | ICD-10-CM | POA: Diagnosis not present

## 2019-07-24 DIAGNOSIS — D631 Anemia in chronic kidney disease: Secondary | ICD-10-CM | POA: Diagnosis not present

## 2019-07-24 NOTE — Patient Outreach (Signed)
[place] [facility]) Care Management  07/24/2019  ELIZANDRO MACISAAC Jun 23, 1961 [id number]   High priority referral received from [place], [place].  "Please assign to [place] for complex case management and [facility] social worker for meals. Member discharged from [facility] today 07/23/19. High risk for readmission. Lives alone. [place] referral made. Will have home health with [facility]. Will need further assist with finding paid caregivers if family friend does not pan out. Goes to HD on M,W, F" Unsuccessful outreach to patient today.  Left voicemail message and mailed Unsuccessful Outreach Letter. Will attempt to reach again within four business days.  Ronn Melena, BSW Social Worker [phone]

## 2019-07-28 ENCOUNTER — Encounter (HOSPITAL_COMMUNITY)
Admission: RE | Admit: 2019-07-28 | Discharge: 2019-07-28 | Disposition: A | Discharge status: Home / Self Care | Payer: Medicare Other | Source: Skilled Nursing Facility | Attending: Internal Medicine | Admitting: Internal Medicine

## 2019-07-28 ENCOUNTER — Other Ambulatory Visit: Payer: Self-pay

## 2019-07-28 ENCOUNTER — Inpatient Hospital Stay: Payer: Medicare Other | Admitting: Family Medicine

## 2019-07-28 DIAGNOSIS — D631 Anemia in chronic kidney disease: Secondary | ICD-10-CM | POA: Insufficient documentation

## 2019-07-28 DIAGNOSIS — E1142 Type 2 diabetes mellitus with diabetic polyneuropathy: Secondary | ICD-10-CM | POA: Insufficient documentation

## 2019-07-28 DIAGNOSIS — S82391D Other fracture of lower end of right tibia, subsequent encounter for closed fracture with routine healing: Secondary | ICD-10-CM | POA: Insufficient documentation

## 2019-07-28 DIAGNOSIS — Z7901 Long term (current) use of anticoagulants: Secondary | ICD-10-CM | POA: Insufficient documentation

## 2019-07-28 NOTE — Patient Outreach (Signed)
[place] [facility]) Care Management  07/28/2019  Steven [name] 09-20-1961 [id number]   Referral received from [place], [place]on [place].  "Please assign to [place] for complex case management and [facility] social worker for meals. Member discharged from [facility] SNF today 07/23/19. High risk for readmission. Lives alone. [place] referral made. Will have home health with [facility]. Will need further assist with finding paid caregivers if family friend does not pan out. Goes to HD on M,W, F" Unsuccessful outreach to patient on 07/24/19.   Was able to connect with patient today.  Patient reported that he is doing well since return home from SNF.  He confirmed Paramedicine and [place] services.  He does have a caregiver several times per week and denied need for additional caregiver services.  Patient reports that he has sufficient amount of food but did acknowledge that he has been "ordering out" a lot.  Talked with him about [employer] program which will allow for heart-healthy, diabetic friendly meal delivery.  Informed patient that this services is temporary (2 deliveries of 14 meals each over the next 4 weeks.)  He consented to referral being submitted and will receive first delivery on Thursday, 07/30/19.   No other social work needs identified at this time.  Encouraged patient to call if additional needs arise.  Ronn Melena, BSW Social Worker [phone]

## 2019-07-29 ENCOUNTER — Other Ambulatory Visit: Payer: Self-pay

## 2019-07-29 ENCOUNTER — Other Ambulatory Visit: Payer: Self-pay | Admitting: *Deleted

## 2019-07-29 NOTE — Patient Outreach (Signed)
Initial telephone assessment. We had a poor connection, pt lives in [place], but he gave me permission to text him.     Steven [name]. [name], MSN, [facility] Gerontological Nurse Practitioner [facility] Care Management [phone]

## 2019-07-30 ENCOUNTER — Encounter: Payer: Self-pay | Admitting: *Deleted

## 2019-07-30 NOTE — Patient Outreach (Signed)
New pt for [facility] Care Management. I spoke briefly with Mr. Liu today. We had a good telephone connection today.   Pt has end-stage renal disease is on dialysis Tuesdays, Thursdays and Saturdays.  He does not have consistant transportation to dialysis. He has exhausted every resouce. Amber Chrismon BSW has also recently reviewed and offered assistance but her suggestions were the same.  Pt is single parent of 71 year old son.  He has most of his adult life had uncontrolled diabetes. He also has COPD., CHF, Obesity, HTN, Depression, Anemia, hx lung mass, recent ankle fx, PE.  He is getting home health also at this time.  He agrees to talk with me on a weekly basis for now.  [name]. [name], MSN, [facility] Gerontological Nurse Practitioner [facility] Care Management [phone]

## 2019-07-31 DIAGNOSIS — D509 Iron deficiency anemia, unspecified: Secondary | ICD-10-CM | POA: Diagnosis not present

## 2019-07-31 DIAGNOSIS — Z992 Dependence on renal dialysis: Secondary | ICD-10-CM | POA: Diagnosis not present

## 2019-07-31 DIAGNOSIS — N2581 Secondary hyperparathyroidism of renal origin: Secondary | ICD-10-CM | POA: Diagnosis not present

## 2019-07-31 DIAGNOSIS — Z23 Encounter for immunization: Secondary | ICD-10-CM | POA: Diagnosis not present

## 2019-07-31 DIAGNOSIS — D631 Anemia in chronic kidney disease: Secondary | ICD-10-CM | POA: Diagnosis not present

## 2019-07-31 DIAGNOSIS — N186 End stage renal disease: Secondary | ICD-10-CM | POA: Diagnosis not present

## 2019-08-03 ENCOUNTER — Encounter: Payer: Self-pay | Admitting: *Deleted

## 2019-08-03 NOTE — Patient Outreach (Signed)
[facility] CM Care Plan Problem One     Most Recent Value  Care Plan Problem One  No Advanced Directives  Role Documenting the Problem One  Care Management Coordinator  Care Plan for Problem One  Active  THN CM Short Term Goal #1   Pt will recieve and complete his advanced directives over the next 30 days as evidenced in his pt chart.  THN CM Short Term Goal #1 Start Date  07/30/19  Interventions for Short Term Goal #1  Discussed the importance of having these documents, pt agreed, sent documents    [facility] CM Care Plan Problem Two     Most Recent Value  Care Plan Problem Two  Has difficulty with transportation all the time to dialysis tues, thurs and saturdays.  Role Documenting the Problem Two  Care Management Coordinator  Care Plan for Problem Two  Active  THN CM Short Term Goal #1   Pt will ask insurance agent to find plan that offers transportation over the next 30 days.  THN CM Short Term Goal #1 Start Date  07/30/19  Interventions for Short Term Goal #2   Advised pt that there are plans that do provide transportation for patients like him.    [facility] CM Care Plan Problem Three     Most Recent Value  Care Plan Problem Three  Diabetes Managment Has not been in control until last measurement of HgbA1C which was 6.1  Role Documenting the Problem Three  Care Management [place] for Problem Three  Active  THN Long Term Goal   Pt Hgb A1C will not exceed 7.0 on next evaluation in December.  THN Long Term Goal Start Date  07/30/19  Interventions for Problem Three Long Term Goal  Reviewed pt chart.     Depression screen PHQ 2/9 05/21/2019  Decreased Interest 0  Down, Depressed, Hopeless 0  PHQ - 2 Score 0  Some recent data might be hidden   Fall Risk  07/30/2019 05/21/2019  Falls in the past year? 1 0  Number falls in past yr: 1 0  Injury with Fall? 1 0  Risk for fall due to : History of fall(s);Impaired balance/gait;Impaired mobility;Medication side effect;Orthopedic patient -   Follow up Education provided;Falls prevention discussed -   I will call pt again on 10/30/20l  Jonathin Heinicke C. [name], MSN, [facility] Gerontological Nurse Practitioner [facility] Care Management [phone]

## 2019-08-04 ENCOUNTER — Ambulatory Visit (HOSPITAL_COMMUNITY): Payer: Medicare Other | Admitting: Hematology

## 2019-08-04 ENCOUNTER — Other Ambulatory Visit: Payer: Self-pay | Admitting: *Deleted

## 2019-08-04 NOTE — Patient Outreach (Signed)
Received voicemail from [place], [employer] with [employer]. Al Corpus reports their team has been unable to to make a visit to Mr. Unthank due to [place] outbreak in their department.    Joni reports she will outreach to Mr. Scronce after November 2nd to discuss home visit.  Made [facility] Care Management team aware that [facility] has not engaged member as of yet due [place].  Marthenia Rolling, MSN-Ed, RN,BSN [place] Acute Care Coordinator [phone] [facility]) [phone]  (Toll free office)

## 2019-08-06 ENCOUNTER — Other Ambulatory Visit: Payer: Self-pay

## 2019-08-06 ENCOUNTER — Inpatient Hospital Stay: Payer: Medicare Other | Admitting: Family Medicine

## 2019-08-06 NOTE — Progress Notes (Incomplete)
Established Patient Office Visit  Subjective:  Patient ID: Steven [name], male    DOB: [birth year]  Age: 58 y.o. MRN: [id number]  CC: No chief complaint on file.   HPI Steven [name] presents for ***  Past Medical History:  Diagnosis Date  . 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 Alcohol abuse   . Anemia   . Asthma   . Cellulitis and abscess of right lower extremity 03/01/2017  . CHF (congestive heart failure) ([place])   . Chronic kidney disease    dialysis m,w,f  . Cognitive communication deficit   . COPD (chronic obstructive pulmonary disease) ([place])   . Coronary artery disease   . Diabetes mellitus without complication ([place])   . Edema extremities 03/01/2017   bilateral swelling  . GERD (gastroesophageal reflux disease)   . High cholesterol   . High triglycerides   . Hyperlipemia   . Hypertension   . Hypothyroidism   . Major depressive disorder   . Muscle weakness   . Neuropathy   . OSA (obstructive sleep apnea)   . Other abnormalities of gait and mobility   . Peripheral vascular disease ([place])   . Pneumonia   . Sleep apnea    can't use cpap d/t feelings of suffocation    Past Surgical History:  Procedure Laterality Date  . A/V FISTULAGRAM Right 08/27/2017   Procedure: A/V FISTULAGRAM;  Surgeon: Katha Cabal, MD;  Location: [facility];  Service: Cardiovascular;  Laterality: Right;  . A/V FISTULAGRAM Right 11/04/2018   Procedure: A/V FISTULAGRAM;  Surgeon: Katha Cabal, MD;  Location: [facility];  Service: Cardiovascular;  Laterality: Right;  . A/V SHUNT INTERVENTION N/A 06/23/2018   Procedure: A/V SHUNT INTERVENTION;  Surgeon: Algernon Huxley, MD;  Location: [place] [facility];  Service: Cardiovascular;  Laterality: N/A;  . APPENDECTOMY  2010  . AV FISTULA PLACEMENT Right 06/14/2017   Procedure: ARTERIOVENOUS (AV) FISTULA CREATION WRIST;  Surgeon: Katha Cabal, MD;  Location: [facility];  Service: Vascular;   Laterality: Right;  . DIALYSIS/PERMA CATHETER INSERTION N/A 03/05/2017   Procedure: Dialysis/Perma Catheter Insertion;  Surgeon: Katha Cabal, MD;  Location: [place] [facility];  Service: Cardiovascular;  Laterality: N/A;  . DIALYSIS/PERMA CATHETER INSERTION N/A 09/20/2017   Procedure: DIALYSIS/PERMA CATHETER INSERTION;  Surgeon: Katha Cabal, MD;  Location: [place] [facility];  Service: Cardiovascular;  Laterality: N/A;  . DIALYSIS/PERMA CATHETER INSERTION N/A 01/26/2019   Procedure: DIALYSIS/PERMA CATHETER INSERTION/Exchange;  Surgeon: Algernon Huxley, MD;  Location: [place] [facility];  Service: Cardiovascular;  Laterality: N/A;  . DIALYSIS/PERMA CATHETER INSERTION N/A 01/27/2019   Procedure: DIALYSIS/PERMA CATHETER INSERTION;  Surgeon: Katha Cabal, MD;  Location: [place] [facility];  Service: Cardiovascular;  Laterality: N/A;  . EXCHANGE OF A DIALYSIS CATHETER  03/29/2017   Procedure: Exchange Of A Dialysis Catheter;  Surgeon: Katha Cabal, MD;  Location: [place] [facility];  Service: Cardiovascular;;  . IRRIGATION AND DEBRIDEMENT FOOT Right 03/01/2017   Procedure: IRRIGATION AND DEBRIDEMENT FOOT;  Surgeon: Albertine Patricia, DPM;  Location: [facility];  Service: Podiatry;  Laterality: Right;  application of wound vac  . ORIF ANKLE FRACTURE Right 06/19/2019   Procedure: OPEN REDUCTION INTERNAL FIXATION (ORIF) ANKLE FRACTURE;  Surgeon: Altamese Glenolden, MD;  Location: [place];  Service: Orthopedics;  Laterality: Right;  . PERIPHERAL VASCULAR THROMBECTOMY Right 06/18/2018   Procedure: PERIPHERAL VASCULAR THROMBECTOMY;  Surgeon: Leotis Pain  S, MD;  Location: [facility] [facility];  Service: Cardiovascular;  Laterality: Right;  . REPAIR NON-UNION ULNA Right 06/19/2019   Procedure: Repair Non-Union right tibia;  Surgeon: Altamese , MD;  Location: [facility];  Service: Orthopedics;  Laterality: Right;  Marland Kitchen VASCULAR ACCESS DEVICE INSERTION Right 01/30/2019   Procedure: INSERTION OF HERO  VASCULAR ACCESS DEVICE;  Surgeon: Katha Cabal, MD;  Location: [facility];  Service: Vascular;  Laterality: Right;    Family History  Problem Relation Age of Onset  . CAD Father   . Heart disease Father     Social History   Socioeconomic History  . Marital status: Divorced    Spouse name: Not on file  . Number of children: 1  . Years of education: Not on file  . Highest education level: Not on file  Occupational History  . Occupation: disability  Social Needs  . Financial resource strain: Not very hard  . Food insecurity    Worry: Never true    Inability: Never true  . Transportation needs    Medical: No    Non-medical: No  Tobacco Use  . Smoking status: Former Research scientist (life sciences)  . Smokeless tobacco: Current User    Types: Snuff  Substance and Sexual Activity  . Alcohol use: Yes    Comment: up until 01/2017 was heavy drinker...4 40 oz qd     twice a week drinker  . Drug use: No  . Sexual activity: Never  Lifestyle  . Physical activity    Days per week: 0 days    Minutes per session: 0 min  . Stress: Not at all  Relationships  . Social connections    Talks on phone: More than three times a week    Gets together: More than three times a week    Attends religious service: Never    Active member of club or organization: No    Attends meetings of clubs or organizations: Never    Relationship status: Divorced  . Intimate partner violence    Fear of current or ex partner: No    Emotionally abused: No    Physically abused: No    Forced sexual activity: No  Other Topics Concern  . Not on file  Social History Narrative   Lives with his 51 year old son. Has a caregiver that helps with some meals and housekeeping.    Outpatient Medications Prior to Visit  Medication Sig Dispense Refill  . acetaminophen (TYLENOL) 325 MG tablet Take 2 tablets (650 mg total) by mouth every 12 (twelve) hours. 60 tablet 0  . calcium acetate (PHOSLO) 667 MG capsule Take 2 capsules (1,334 mg  total) by mouth 3 (three) times daily with meals. 180 capsule 0  . furosemide (LASIX) 80 MG tablet Take 1 tablet (80 mg total) by mouth daily. 30 tablet 0  . gabapentin (NEURONTIN) 300 MG capsule Take 1 capsule (300 mg total) by mouth at bedtime. 30 capsule 0  . insulin aspart (NOVOLOG FLEXPEN) 100 UNIT/ML FlexPen Inject 6-16 Units into the skin 3 (three) times daily with meals. Per Sliding Scale; If Blood Sugar is less than 60, call MD. If Blood Sugar is 150 to 200, give 2 Units. If Blood Sugar is 201 to 250, give 4 Units. If Blood Sugar is 251 to 300, give 6 Units. If Blood Sugar is 301 to 350, give 8 Units. If Blood Sugar is 351 to 400, give 10 Units. If Blood Sugar is greater than 400, call MD.  subcutaneous With Meals GIVE 6 UNITS WITH MEALS IN ADDITION TO SLIDING SCALE 15 mL 0  . Insulin Detemir (LEVEMIR FLEXTOUCH) 100 UNIT/ML Pen Inject 14 Units into the skin at bedtime. 15 mL 0  . metoprolol tartrate (LOPRESSOR) 25 MG tablet Take 0.5 tablets (12.5 mg total) by mouth 2 (two) times daily. 30 tablet 0  . midodrine (PROAMATINE) 10 MG tablet Take 1 tablet (10 mg total) by mouth 2 (two) times daily with a meal. 60 tablet 1  . mirtazapine (REMERON) 15 MG tablet Take 1 tablet (15 mg total) by mouth at bedtime. 30 tablet 0  . mometasone-formoterol (DULERA) 100-5 MCG/ACT AERO Inhale 2 puffs into the lungs every morning. 8.8 g 0  . NON FORMULARY Diet: Regular, NAS, Consistent Carbohydrate    . NON FORMULARY Fluid restriction: 1200cc a day Special Instructions: Special Instructions: 500cc for days, Evening 500cc and night 200cc Every Shift Day, Evening, Night    . omeprazole (PRILOSEC OTC) 20 MG tablet Take 1 tablet (20 mg total) by mouth daily. 30 tablet 1  . ondansetron (ZOFRAN) 4 MG tablet Take 1 tablet (4 mg total) by mouth every 6 (six) hours as needed for nausea. 20 tablet 0  . PROAIR HFA 108 (90 Base) MCG/ACT inhaler Inhale 2 puffs into the lungs every 4 (four) hours as needed for wheezing or  shortness of breath. 6.7 g 0  . sodium bicarbonate 650 MG tablet Take 1 tablet (650 mg total) by mouth 2 (two) times daily. 60 tablet 0  . spironolactone (ALDACTONE) 25 MG tablet Take 1 tablet (25 mg total) by mouth daily. 30 tablet 0  . warfarin (COUMADIN) 5 MG tablet Take 1.5 tablets (7.5 mg total) by mouth daily at 6 PM. Special Instructions: Take 1.5 tabs (7.5 mg daily except Wednesday) Use appropriate precautions for receiving, handling, administration, and disposal. Gloves (single) should be worn during receiving, unpacking, and placing in storage. NIOSH recommends single gloving for administration of intact tablets or capsules. Once A Day on Sun, Mon, Tue, Thu, Fri, Sat 45 tablet 0   No facility-administered medications prior to visit.     Allergies  Allergen Reactions  . Codeine Nausea And Vomiting    ROS Review of Systems    Objective:    Physical Exam  There were no vitals taken for this visit. Wt Readings from Last 3 Encounters:  07/23/19 234 lb (106.1 kg)  07/20/19 234 lb 9.6 oz (106.4 kg)  07/14/19 234 lb 9.6 oz (106.4 kg)     Health Maintenance Due  Topic Date Due  . Hepatitis C Screening  1961/01/31  . OPHTHALMOLOGY EXAM  12/30/1971  . URINE MICROALBUMIN  12/30/1971  . COLONOSCOPY  12/30/2011  . FOOT EXAM  02/27/2018    There are no preventive care reminders to display for this patient.  Lab Results  Component Value Date   TSH 3.725 02/12/2019   Lab Results  Component Value Date   WBC 4.8 06/26/2019   HGB 9.2 (L) 06/26/2019   HCT 28.7 (L) 06/26/2019   MCV 87.8 06/26/2019   PLT 240 06/26/2019   Lab Results  Component Value Date   NA 131 (L) 06/26/2019   K 3.9 06/26/2019   CO2 23 06/26/2019   GLUCOSE 304 (H) 06/26/2019   BUN 33 (H) 06/26/2019   CREATININE 4.19 (H) 06/26/2019   BILITOT 0.9 06/16/2019   ALKPHOS 126 06/16/2019   AST 20 06/16/2019   ALT 17 06/16/2019   PROT 6.2 (L) 06/16/2019  ALBUMIN 2.3 (L) 06/26/2019   CALCIUM 7.9 (L)  06/26/2019   ANIONGAP 12 06/26/2019   Lab Results  Component Value Date   CHOL 230 (H) 02/13/2019   Lab Results  Component Value Date   HDL 34 (L) 02/13/2019   Lab Results  Component Value Date   LDLCALC 157 (H) 02/13/2019   Lab Results  Component Value Date   TRIG 196 (H) 02/13/2019   Lab Results  Component Value Date   CHOLHDL 6.8 02/13/2019   Lab Results  Component Value Date   HGBA1C 6.1 (H) 06/19/2019      Assessment & Plan:   Problem List Items Addressed This Visit    None      No orders of the defined types were placed in this encounter.   Follow-up: No follow-ups on file.    LISA Hannah Beat, MD

## 2019-08-07 ENCOUNTER — Other Ambulatory Visit: Payer: Self-pay | Admitting: *Deleted

## 2019-08-07 NOTE — Patient Outreach (Signed)
Telephone outreach unsuccessful. Left message on voicemail, also texted pt to call me and emailed pt that I am trying to reach him for follow up.  I will call on Monday, if I do not hear back from him.  Of note: pt lives in [place] and phone and internet connections can be difficult in his area.  [name]. [name], MSN, [facility] Gerontological Nurse Practitioner [facility] Care Management [phone]

## 2019-08-08 ENCOUNTER — Observation Stay (HOSPITAL_COMMUNITY)
Admission: EM | Admit: 2019-08-08 | Discharge: 2019-08-09 | Disposition: A | Discharge status: Home / Self Care | Payer: Medicare Other | Attending: Internal Medicine | Admitting: Internal Medicine

## 2019-08-08 ENCOUNTER — Encounter (HOSPITAL_COMMUNITY): Payer: Self-pay | Admitting: *Deleted

## 2019-08-08 ENCOUNTER — Other Ambulatory Visit: Payer: Self-pay

## 2019-08-08 DIAGNOSIS — E1169 Type 2 diabetes mellitus with other specified complication: Secondary | ICD-10-CM | POA: Diagnosis present

## 2019-08-08 DIAGNOSIS — F339 Major depressive disorder, recurrent, unspecified: Secondary | ICD-10-CM | POA: Diagnosis not present

## 2019-08-08 DIAGNOSIS — Z7951 Long term (current) use of inhaled steroids: Secondary | ICD-10-CM | POA: Insufficient documentation

## 2019-08-08 DIAGNOSIS — J449 Chronic obstructive pulmonary disease, unspecified: Secondary | ICD-10-CM | POA: Diagnosis not present

## 2019-08-08 DIAGNOSIS — Z8673 Personal history of transient ischemic attack (TIA), and cerebral infarction without residual deficits: Secondary | ICD-10-CM | POA: Diagnosis not present

## 2019-08-08 DIAGNOSIS — F1010 Alcohol abuse, uncomplicated: Secondary | ICD-10-CM | POA: Diagnosis present

## 2019-08-08 DIAGNOSIS — E669 Obesity, unspecified: Secondary | ICD-10-CM | POA: Diagnosis not present

## 2019-08-08 DIAGNOSIS — Z794 Long term (current) use of insulin: Secondary | ICD-10-CM | POA: Insufficient documentation

## 2019-08-08 DIAGNOSIS — E7800 Pure hypercholesterolemia, unspecified: Secondary | ICD-10-CM | POA: Insufficient documentation

## 2019-08-08 DIAGNOSIS — Z9119 Patient's noncompliance with other medical treatment and regimen: Secondary | ICD-10-CM | POA: Insufficient documentation

## 2019-08-08 DIAGNOSIS — I2510 Atherosclerotic heart disease of native coronary artery without angina pectoris: Secondary | ICD-10-CM | POA: Insufficient documentation

## 2019-08-08 DIAGNOSIS — Z9115 Patient's noncompliance with renal dialysis: Secondary | ICD-10-CM | POA: Diagnosis not present

## 2019-08-08 DIAGNOSIS — I120 Hypertensive chronic kidney disease with stage 5 chronic kidney disease or end stage renal disease: Secondary | ICD-10-CM | POA: Diagnosis not present

## 2019-08-08 DIAGNOSIS — K219 Gastro-esophageal reflux disease without esophagitis: Secondary | ICD-10-CM | POA: Diagnosis not present

## 2019-08-08 DIAGNOSIS — G9341 Metabolic encephalopathy: Principal | ICD-10-CM | POA: Insufficient documentation

## 2019-08-08 DIAGNOSIS — Z6828 Body mass index (BMI) 28.0-28.9, adult: Secondary | ICD-10-CM | POA: Insufficient documentation

## 2019-08-08 DIAGNOSIS — E1122 Type 2 diabetes mellitus with diabetic chronic kidney disease: Secondary | ICD-10-CM | POA: Diagnosis not present

## 2019-08-08 DIAGNOSIS — I132 Hypertensive heart and chronic kidney disease with heart failure and with stage 5 chronic kidney disease, or end stage renal disease: Secondary | ICD-10-CM | POA: Insufficient documentation

## 2019-08-08 DIAGNOSIS — R410 Disorientation, unspecified: Secondary | ICD-10-CM | POA: Diagnosis not present

## 2019-08-08 DIAGNOSIS — Z87891 Personal history of nicotine dependence: Secondary | ICD-10-CM | POA: Insufficient documentation

## 2019-08-08 DIAGNOSIS — Z8249 Family history of ischemic heart disease and other diseases of the circulatory system: Secondary | ICD-10-CM | POA: Insufficient documentation

## 2019-08-08 DIAGNOSIS — Z992 Dependence on renal dialysis: Secondary | ICD-10-CM | POA: Diagnosis not present

## 2019-08-08 DIAGNOSIS — N186 End stage renal disease: Secondary | ICD-10-CM | POA: Diagnosis not present

## 2019-08-08 DIAGNOSIS — E039 Hypothyroidism, unspecified: Secondary | ICD-10-CM | POA: Insufficient documentation

## 2019-08-08 DIAGNOSIS — E1151 Type 2 diabetes mellitus with diabetic peripheral angiopathy without gangrene: Secondary | ICD-10-CM | POA: Insufficient documentation

## 2019-08-08 DIAGNOSIS — Z86718 Personal history of other venous thrombosis and embolism: Secondary | ICD-10-CM | POA: Insufficient documentation

## 2019-08-08 DIAGNOSIS — Z86711 Personal history of pulmonary embolism: Secondary | ICD-10-CM | POA: Diagnosis not present

## 2019-08-08 DIAGNOSIS — Z720 Tobacco use: Secondary | ICD-10-CM | POA: Diagnosis present

## 2019-08-08 DIAGNOSIS — Z79899 Other long term (current) drug therapy: Secondary | ICD-10-CM | POA: Insufficient documentation

## 2019-08-08 DIAGNOSIS — I5032 Chronic diastolic (congestive) heart failure: Secondary | ICD-10-CM | POA: Diagnosis not present

## 2019-08-08 DIAGNOSIS — E785 Hyperlipidemia, unspecified: Secondary | ICD-10-CM | POA: Insufficient documentation

## 2019-08-08 DIAGNOSIS — Z7901 Long term (current) use of anticoagulants: Secondary | ICD-10-CM | POA: Insufficient documentation

## 2019-08-08 DIAGNOSIS — G4733 Obstructive sleep apnea (adult) (pediatric): Secondary | ICD-10-CM | POA: Diagnosis not present

## 2019-08-08 DIAGNOSIS — Z20828 Contact with and (suspected) exposure to other viral communicable diseases: Secondary | ICD-10-CM | POA: Diagnosis not present

## 2019-08-08 DIAGNOSIS — I10 Essential (primary) hypertension: Secondary | ICD-10-CM | POA: Diagnosis present

## 2019-08-08 DIAGNOSIS — Z03818 Encounter for observation for suspected exposure to other biological agents ruled out: Secondary | ICD-10-CM | POA: Diagnosis not present

## 2019-08-08 DIAGNOSIS — N185 Chronic kidney disease, stage 5: Secondary | ICD-10-CM | POA: Diagnosis present

## 2019-08-08 LAB — CBG MONITORING, ED
Glucose-Capillary: 464 mg/dL — ABNORMAL HIGH (ref 70–99)
Glucose-Capillary: 363 mg/dL — ABNORMAL HIGH (ref 70–99)

## 2019-08-08 LAB — CBC WITH DIFFERENTIAL/PLATELET
Abs Immature Granulocytes: 0.01 10*3/uL (ref 0.00–0.07)
Basophils Absolute: 0 10*3/uL (ref 0.0–0.1)
Basophils Relative: 0 %
Eosinophils Absolute: 0 10*3/uL (ref 0.0–0.5)
Eosinophils Relative: 1 %
HCT: 37.4 % — ABNORMAL LOW (ref 39.0–52.0)
Hemoglobin: 11.6 g/dL — ABNORMAL LOW (ref 13.0–17.0)
Immature Granulocytes: 0 %
Lymphocytes Relative: 12 %
Lymphs Abs: 0.9 10*3/uL (ref 0.7–4.0)
MCH: 27.5 pg (ref 26.0–34.0)
MCHC: 31 g/dL (ref 30.0–36.0)
MCV: 88.6 fL (ref 80.0–100.0)
Monocytes Absolute: 0.4 10*3/uL (ref 0.1–1.0)
Monocytes Relative: 5 %
Neutro Abs: 6 10*3/uL (ref 1.7–7.7)
Neutrophils Relative %: 82 %
Platelets: 217 10*3/uL (ref 150–400)
RBC: 4.22 MIL/uL (ref 4.22–5.81)
RDW: 15.9 % — ABNORMAL HIGH (ref 11.5–15.5)
WBC: 7.4 10*3/uL (ref 4.0–10.5)
nRBC: 0 % (ref 0.0–0.2)

## 2019-08-08 LAB — URINALYSIS, ROUTINE W REFLEX MICROSCOPIC
Bacteria, UA: NONE SEEN
Bilirubin Urine: NEGATIVE
Glucose, UA: >=500 mg/dL — AB
Hgb urine dipstick: NEGATIVE
Ketones, ur: NEGATIVE mg/dL
Leukocytes,Ua: NEGATIVE
Nitrite: NEGATIVE
Protein, ur: >=300 mg/dL — AB
Specific Gravity, Urine: 1.015 (ref 1.005–1.030)
pH: 7 (ref 5.0–8.0)

## 2019-08-08 LAB — HEMOGLOBIN A1C
Hgb A1c MFr Bld: 7.5 % — ABNORMAL HIGH (ref 4.8–5.6)
Mean Plasma Glucose: 168.55 mg/dL

## 2019-08-08 LAB — BASIC METABOLIC PANEL
Anion gap: 13 (ref 5–15)
BUN: 50 mg/dL — ABNORMAL HIGH (ref 6–20)
CO2: 22 mmol/L (ref 22–32)
Calcium: 8.9 mg/dL (ref 8.9–10.3)
Chloride: 93 mmol/L — ABNORMAL LOW (ref 98–111)
Creatinine, Ser: 7.39 mg/dL — ABNORMAL HIGH (ref 0.61–1.24)
GFR calc Af Amer: 9 mL/min — ABNORMAL LOW (ref >60)
GFR calc non Af Amer: 7 mL/min — ABNORMAL LOW (ref >60)
Glucose, Bld: 501 mg/dL (ref 70–99)
Potassium: 4.7 mmol/L (ref 3.5–5.1)
Sodium: 128 mmol/L — ABNORMAL LOW (ref 135–145)

## 2019-08-08 LAB — HEPATIC FUNCTION PANEL
ALT: 27 U/L (ref 0–44)
AST: 28 U/L (ref 15–41)
Albumin: 3.6 g/dL (ref 3.5–5.0)
Alkaline Phosphatase: 206 U/L — ABNORMAL HIGH (ref 38–126)
Bilirubin, Direct: 0.2 mg/dL (ref 0.0–0.2)
Indirect Bilirubin: 0.6 mg/dL (ref 0.3–0.9)
Total Bilirubin: 0.8 mg/dL (ref 0.3–1.2)
Total Protein: 7.6 g/dL (ref 6.5–8.1)

## 2019-08-08 LAB — AMMONIA: Ammonia: 17 umol/L (ref 9–35)

## 2019-08-08 LAB — ETHANOL: Alcohol, Ethyl (B): <10 mg/dL (ref <10)

## 2019-08-08 MED ORDER — TRAZODONE HCL 50 MG PO TABS: 50.0000 mg | ORAL_TABLET | Freq: Every evening | ORAL | Status: DC | PRN

## 2019-08-08 MED ORDER — POLYETHYLENE GLYCOL 3350 17 G PO PACK: 17.0000 g | PACK | Freq: Every day | ORAL | Status: DC | PRN

## 2019-08-08 MED ORDER — ONDANSETRON HCL 4 MG/2ML IJ SOLN: 4.0000 mg | Freq: Four times a day (QID) | INTRAMUSCULAR | Status: DC | PRN

## 2019-08-08 MED ORDER — MIDODRINE HCL 5 MG PO TABS
10.0000 mg | ORAL_TABLET | Freq: Two times a day (BID) | ORAL | Status: DC
  Filled 2019-08-08 (×3): qty 2

## 2019-08-08 MED ORDER — MIRTAZAPINE 15 MG PO TABS
15.0000 mg | ORAL_TABLET | Freq: Every day | ORAL | Status: DC
  Administered 2019-08-08: 15 mg via ORAL
  Filled 2019-08-08: qty 1

## 2019-08-08 MED ORDER — ONDANSETRON HCL 4 MG PO TABS: 4.0000 mg | ORAL_TABLET | Freq: Four times a day (QID) | ORAL | Status: DC | PRN

## 2019-08-08 MED ORDER — ALBUTEROL SULFATE (2.5 MG/3ML) 0.083% IN NEBU: 2.5000 mg | INHALATION_SOLUTION | RESPIRATORY_TRACT | Status: DC | PRN

## 2019-08-08 MED ORDER — CALCIUM ACETATE (PHOS BINDER) 667 MG PO CAPS
1334.0000 mg | ORAL_CAPSULE | Freq: Three times a day (TID) | ORAL | Status: DC
  Filled 2019-08-08 (×5): qty 2

## 2019-08-08 MED ORDER — ALBUTEROL SULFATE HFA 108 (90 BASE) MCG/ACT IN AERS: 2.0000 | INHALATION_SPRAY | RESPIRATORY_TRACT | Status: DC | PRN

## 2019-08-08 MED ORDER — SPIRONOLACTONE 25 MG PO TABS
25.0000 mg | ORAL_TABLET | Freq: Every day | ORAL | Status: DC
  Filled 2019-08-08 (×2): qty 1

## 2019-08-08 MED ORDER — ACETAMINOPHEN 650 MG RE SUPP: 650.0000 mg | Freq: Four times a day (QID) | RECTAL | Status: DC | PRN

## 2019-08-08 MED ORDER — INSULIN ASPART 100 UNIT/ML ~~LOC~~ SOLN: 3.0000 [IU] | Freq: Three times a day (TID) | SUBCUTANEOUS | Status: DC

## 2019-08-08 MED ORDER — LABETALOL HCL 5 MG/ML IV SOLN: 10.0000 mg | INTRAVENOUS | Status: DC | PRN

## 2019-08-08 MED ORDER — CALCIUM CARBONATE ANTACID 500 MG PO CHEW
400.0000 mg | CHEWABLE_TABLET | Freq: Once | ORAL | Status: AC
  Administered 2019-08-08: 400 mg via ORAL
  Filled 2019-08-08: qty 2

## 2019-08-08 MED ORDER — INSULIN DETEMIR 100 UNIT/ML ~~LOC~~ SOLN
14.0000 [IU] | Freq: Every day | SUBCUTANEOUS | Status: DC
  Administered 2019-08-09: 01:00:00 14 [IU] via SUBCUTANEOUS
  Filled 2019-08-08 (×2): qty 0.14

## 2019-08-08 MED ORDER — INSULIN ASPART 100 UNIT/ML ~~LOC~~ SOLN
0.0000 [IU] | Freq: Every day | SUBCUTANEOUS | Status: DC
  Administered 2019-08-08: 5 [IU] via SUBCUTANEOUS
  Filled 2019-08-08: qty 1

## 2019-08-08 MED ORDER — OMEPRAZOLE MAGNESIUM 20 MG PO TBEC: 20.0000 mg | DELAYED_RELEASE_TABLET | Freq: Every day | ORAL | Status: DC

## 2019-08-08 MED ORDER — METOPROLOL TARTRATE 25 MG PO TABS
12.5000 mg | ORAL_TABLET | Freq: Two times a day (BID) | ORAL | Status: DC
  Administered 2019-08-08: 12.5 mg via ORAL
  Filled 2019-08-08: qty 1

## 2019-08-08 MED ORDER — FUROSEMIDE 10 MG/ML IJ SOLN
80.0000 mg | Freq: Once | INTRAMUSCULAR | Status: AC
  Administered 2019-08-08: 23:00:00 80 mg via INTRAVENOUS
  Filled 2019-08-08: qty 8

## 2019-08-08 MED ORDER — SODIUM BICARBONATE 650 MG PO TABS
650.0000 mg | ORAL_TABLET | Freq: Two times a day (BID) | ORAL | Status: DC
  Administered 2019-08-09: 01:00:00 650 mg via ORAL
  Filled 2019-08-08 (×5): qty 1

## 2019-08-08 MED ORDER — INSULIN DETEMIR 100 UNIT/ML ~~LOC~~ SOLN
12.0000 [IU] | Freq: Once | SUBCUTANEOUS | Status: DC
  Filled 2019-08-08: qty 0.12

## 2019-08-08 MED ORDER — FUROSEMIDE 40 MG PO TABS: 80.0000 mg | ORAL_TABLET | Freq: Every day | ORAL | Status: DC

## 2019-08-08 MED ORDER — INSULIN ASPART 100 UNIT/ML ~~LOC~~ SOLN: 0.0000 [IU] | Freq: Three times a day (TID) | SUBCUTANEOUS | Status: DC

## 2019-08-08 MED ORDER — SODIUM CHLORIDE 0.9% FLUSH: 3.0000 mL | Freq: Two times a day (BID) | INTRAVENOUS | Status: DC

## 2019-08-08 MED ORDER — MOMETASONE FURO-FORMOTEROL FUM 100-5 MCG/ACT IN AERO
2.0000 | INHALATION_SPRAY | RESPIRATORY_TRACT | Status: DC
  Administered 2019-08-09: 08:00:00 2 via RESPIRATORY_TRACT
  Filled 2019-08-08: qty 8.8

## 2019-08-08 MED ORDER — PANTOPRAZOLE SODIUM 40 MG PO TBEC
40.0000 mg | DELAYED_RELEASE_TABLET | Freq: Every day | ORAL | Status: DC
  Administered 2019-08-09: 40 mg via ORAL
  Filled 2019-08-08: qty 1

## 2019-08-08 MED ORDER — FAMOTIDINE IN NACL 20-0.9 MG/50ML-% IV SOLN
20.0000 mg | Freq: Once | INTRAVENOUS | Status: AC
  Administered 2019-08-08: 20 mg via INTRAVENOUS
  Filled 2019-08-08: qty 50

## 2019-08-08 MED ORDER — GABAPENTIN 300 MG PO CAPS
300.0000 mg | ORAL_CAPSULE | Freq: Every day | ORAL | Status: DC
  Administered 2019-08-08: 300 mg via ORAL
  Filled 2019-08-08: qty 1

## 2019-08-08 MED ORDER — SODIUM CHLORIDE 0.9% FLUSH: 3.0000 mL | INTRAVENOUS | Status: DC | PRN

## 2019-08-08 MED ORDER — ACETAMINOPHEN 325 MG PO TABS: 650.0000 mg | ORAL_TABLET | Freq: Four times a day (QID) | ORAL | Status: DC | PRN

## 2019-08-08 MED ORDER — ACETAMINOPHEN 325 MG PO TABS
650.0000 mg | ORAL_TABLET | Freq: Two times a day (BID) | ORAL | Status: DC
  Administered 2019-08-08: 23:00:00 650 mg via ORAL
  Filled 2019-08-08: qty 2

## 2019-08-08 MED ORDER — SODIUM CHLORIDE 0.9 % IV SOLN: 250.0000 mL | INTRAVENOUS | Status: DC | PRN
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
## 2019-08-08 NOTE — ED Notes (Signed)
Date and time results received: 08/08/19 158:13 (use smartphrase ".now" to insert current time)  Test: glucose Critical Value: 501  Name of Provider Notified: Lenis Noon PA  Orders Received? Or Actions Taken?:  See emr for additional

## 2019-08-08 NOTE — ED Notes (Signed)
In to give pt's 2200 meds when he informed me that he wanted to leave AMA and come back Monday for dialysis or go to [place]. Dr. Roxan Hockey notified of this and was informed by him that pt lacked decision making capacity and therefore could not leave and that it was well documented in his H&P. Stated we could get security if pt tries to leave or have EDP IVC him if absolutely necessary. Will discuss plan with pt and continue to monitor.

## 2019-08-08 NOTE — H&P (Signed)
Patient Demographics:    Steven [name], is a 58 y.o. male  MRN: [id number]   DOB - 1961/02/08  Admit Date - 08/08/2019  Outpatient Primary MD for the patient is Corum, Rex Kras, MD   Assessment & Plan:    Principal Problem:   Acute metabolic encephalopathy Active Problems:   Hypertension associated with stage 5 chronic kidney disease due to type 2 diabetes mellitus (HCC)   End stage renal disease on dialysis due to type 2 diabetes mellitus (HCC)   Major depression, recurrent, chronic (HCC)   Diabetes mellitus type 2 in obese ([place])   Essential hypertension   OSA (obstructive sleep apnea)   ESRD on hemodialysis (HCC)   Chronic anticoagulation   Chronic diastolic heart failure (HCC)   History of pulmonary embolus (PE)   Tobacco abuse disorder--- Dips Tobbacco   Alcohol abuse    1) acute metabolic encephalopathy----due to missed hemodialysis sessions, last HD 07/31/2019 (M/W/F schedule usually) -Give IV Lasix, hemodialysis per nephrology team As per Dr Jonnie Finner  Dr Justin Mend will see in am -No evidence of significant acute infection or acute neuro deficits at this time -No leukocytosis, no fevers, UA unremarkable, serum ammonia is not elevated -Continue sodium bicarb and PhosLo  2)DM2-with hyperglycemia--A1c was 6.1 on 06/19/2019 reflecting excellent diabetic control--- current hyperglycemia is due to missed doses of insulin due to patient being confused over the last several days (missed HD) -Restart Levemir insulin along with sliding scale coverage -Bicarb and anion gap WNL  3) history of pulmonary embolism/ H/o Prior CVA--on chronic anticoagulation Coumadin pharmacy consult requested  4)Social/Ethics- I called and obtained additional information from patient's sister Ms. Wille Glaser [phone]) and patient's  older sister Ms. [name] [phone]) -Patient is a full code -Patient with history of poor decision-making, some cognitive concerns and recurrent disorientation and confusion especially with missed hemodialysis sessions-- Steven [name] is  a  58 y.o.  who is AAO x  2 with h/o  depression and occasional delusion/psychosis who is  without significant language barrier is currently unable to fully comprehend/understand his/her current medical diagnosis, patient has difficulty accurately verbalizes understanding of proposed treatment options including option of no treatment, the consequences/risk versus benefit of each treatment option, alternatives as well as the option of no treatment.  -Based on my evaluation Steven [name]  appears to NOT have Capacity to make decisions and give informed consent about his/her medical care at this time. A surrogate decision-maker is required as Steven [name]  appears to NOT have capacity to make his own decisions and give informed consent regarding his medical care at this time --Patient should be reevaluated after hemodialysis session to see if his decision-making ability improves.  -Patient's sisters may be in a better position to make decisions for him at this time  5)COPD-stable, continue bronchodilators  6)HTN-stable, continue metoprolol 12.5 mg twice daily,   may use IV labetalol when necessary  Every 4 hours  for systolic blood pressure over 160 mmhg  7)Disposition- Apparently --- according to patient's sisters x 2 who I  spoke with patient has a history of missing hemodialysis sessions, patient has a history of noncompliance, patient has a history of getting lost, patient has a history of confusional episodes and disorientation from time to time -Case manager and social work consult requested, patient may need Adult Protective Services referral -Family may need to consider guardianship application   With History of - Reviewed by me  Past  Medical History:  Diagnosis Date  . 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 Alcohol abuse   . Anemia   . Asthma   . Cellulitis and abscess of right lower extremity 03/01/2017  . CHF (congestive heart failure) ([place])   . Chronic kidney disease    dialysis m,w,f  . Cognitive communication deficit   . COPD (chronic obstructive pulmonary disease) ([place])   . Coronary artery disease   . Diabetes mellitus without complication ([place])   . Edema extremities 03/01/2017   bilateral swelling  . GERD (gastroesophageal reflux disease)   . High cholesterol   . High triglycerides   . Hyperlipemia   . Hypertension   . Hypothyroidism   . Major depressive disorder   . Muscle weakness   . Neuropathy   . OSA (obstructive sleep apnea)   . Other abnormalities of gait and mobility   . Peripheral vascular disease ([place])   . Pneumonia   . Sleep apnea    can't use cpap d/t feelings of suffocation      Past Surgical History:  Procedure Laterality Date  . A/V FISTULAGRAM Right 08/27/2017   Procedure: A/V FISTULAGRAM;  Surgeon: Katha Cabal, MD;  Location: [facility];  Service: Cardiovascular;  Laterality: Right;  . A/V FISTULAGRAM Right 11/04/2018   Procedure: A/V FISTULAGRAM;  Surgeon: Katha Cabal, MD;  Location: [facility];  Service: Cardiovascular;  Laterality: Right;  . A/V SHUNT INTERVENTION N/A 06/23/2018   Procedure: A/V SHUNT INTERVENTION;  Surgeon: Algernon Huxley, MD;  Location: [facility];  Service: Cardiovascular;  Laterality: N/A;  . APPENDECTOMY  2010  . AV FISTULA PLACEMENT Right 06/14/2017   Procedure: ARTERIOVENOUS (AV) FISTULA CREATION WRIST;  Surgeon: Katha Cabal, MD;  Location: [facility];  Service: Vascular;  Laterality: Right;  . DIALYSIS/PERMA CATHETER INSERTION N/A 03/05/2017   Procedure: Dialysis/Perma Catheter Insertion;  Surgeon: Katha Cabal, MD;  Location: [facility];  Service: Cardiovascular;   Laterality: N/A;  . DIALYSIS/PERMA CATHETER INSERTION N/A 09/20/2017   Procedure: DIALYSIS/PERMA CATHETER INSERTION;  Surgeon: Katha Cabal, MD;  Location: [facility];  Service: Cardiovascular;  Laterality: N/A;  . DIALYSIS/PERMA CATHETER INSERTION N/A 01/26/2019   Procedure: DIALYSIS/PERMA CATHETER INSERTION/Exchange;  Surgeon: Algernon Huxley, MD;  Location: [facility];  Service: Cardiovascular;  Laterality: N/A;  . DIALYSIS/PERMA CATHETER INSERTION N/A 01/27/2019   Procedure: DIALYSIS/PERMA CATHETER INSERTION;  Surgeon: Katha Cabal, MD;  Location: [facility];  Service: Cardiovascular;  Laterality: N/A;  . EXCHANGE OF A DIALYSIS CATHETER  03/29/2017   Procedure: Exchange Of A Dialysis Catheter;  Surgeon: Katha Cabal, MD;  Location: [facility];  Service: Cardiovascular;;  . IRRIGATION AND DEBRIDEMENT FOOT Right 03/01/2017   Procedure: IRRIGATION AND DEBRIDEMENT FOOT;  Surgeon: Albertine Patricia, DPM;  Location: [facility];  Service: Podiatry;  Laterality: Right;  application of wound vac  .  ORIF ANKLE FRACTURE Right 06/19/2019   Procedure: OPEN REDUCTION INTERNAL FIXATION (ORIF) ANKLE FRACTURE;  Surgeon: Altamese Vacaville, MD;  Location: [facility];  Service: Orthopedics;  Laterality: Right;  . PERIPHERAL VASCULAR THROMBECTOMY Right 06/18/2018   Procedure: PERIPHERAL VASCULAR THROMBECTOMY;  Surgeon: Algernon Huxley, MD;  Location: [facility];  Service: Cardiovascular;  Laterality: Right;  . REPAIR NON-UNION ULNA Right 06/19/2019   Procedure: Repair Non-Union right tibia;  Surgeon: Altamese Subiaco, MD;  Location: [facility];  Service: Orthopedics;  Laterality: Right;  Marland Kitchen VASCULAR ACCESS DEVICE INSERTION Right 01/30/2019   Procedure: INSERTION OF HERO VASCULAR ACCESS DEVICE;  Surgeon: Katha Cabal, MD;  Location: [facility];  Service: Vascular;  Laterality: Right;      Chief Complaint  Patient presents with  . Altered Mental Status    Needs Dialysis       HPI:    Steven [name]  is a 58 y.o. male with history of noncompliance with hemodialysis sessions diet and medications, tobacco and alcohol abuse, ESRD on HD Monday Wednesdays Fridays, HFpEF, HTN, HLD, DM2, COPD, GERD, prior H/o PE (on Coumadin), prior h/o CVA Presents to the ED with confusion episodes/concerns  -Patient is a poor historian  I called and obtained additional information from patient's sister Ms. Wille Glaser [phone]) and patient's older sister Ms. [name] [phone])  Patient appears to have acute metabolic encephalopathy----due to missed hemodialysis sessions, last HD 07/31/2019 (M/W/F schedule usually)  -He has no fevers, no chills, no vomiting, no diarrhea, no productive cough, denies abdominal pain, denies chest pains,  -no evidence of significant infectious process  -Last HD session 07/31/2019  -on 08/05/2019 patient apparently left for [place] to go eat, and got lost that evening he could not make his way back and ended up in [place], [place], family arranged for a friend to come pick him up the evening of 08/05/2019, the left patient's car behind in [place]  -On 08/06/2019 patient's friend took him back to [place] to go bring his car back, patient got lost again--he ended up in [place] ,[place].  -Patient sister who lives in [place] drove all the way to [place] with [place] to pick patient up on 08/08/2019 and brought patient to the ED for further evaluation due to persistent confusion/disorientation and missed hemodialysis session and missed medications  -Apparently --- according to patient's sisters x 2 who I  spoke with patient has a history of missing hemodialysis sessions, patient has a history of noncompliance, patient has a history of getting lost, patient has a history of confusional episodes and disorientation from time to time  -No shortness of breath or hypoxia  -In ED-ammonia 17, CBC  with white count of 7.4 H&H is 11.6 and 37.4 with platelets of 217 -BMP with a sodium of 128, potassium is 4.7, chloride is 93, bicarb is 22, glucose is 501, BUN 50, creatinine 7.39 with anion gap of 13 and a calcium of 8.9 -LFTs are not elevated except for alk phos of 206 BAL-less than 10 -UA with proteinuria and glucosuria, no evidence of infection -COVID-19 test pending    Review of systems:    In addition to the HPI above,   A full Review of  Systems was done, all other systems reviewed are negative except as noted above in HPI , .    Social History:  Reviewed by me    Social History   Tobacco Use  . Smoking status: Former Research scientist (life sciences)  . Smokeless tobacco:  Current User    Types: Snuff  Substance Use Topics  . Alcohol use: Yes    Comment: up until 01/2017 was heavy drinker...1-2 beers a couple times a week now     Family History :  Reviewed by me    Family History  Problem Relation Age of Onset  . CAD Father   . Heart disease Father      Home Medications:   Prior to Admission medications   Medication Sig Start Date End Date Taking? Authorizing Provider  calcium acetate (PHOSLO) 667 MG capsule Take 2 capsules (1,334 mg total) by mouth 3 (three) times daily with meals. 07/23/19  Yes Gerlene Fee, NP  furosemide (LASIX) 80 MG tablet Take 1 tablet (80 mg total) by mouth daily. 07/23/19  Yes Gerlene Fee, NP  gabapentin (NEURONTIN) 300 MG capsule Take 1 capsule (300 mg total) by mouth at bedtime. 07/23/19  Yes Gerlene Fee, NP  insulin aspart (NOVOLOG FLEXPEN) 100 UNIT/ML FlexPen Inject 6-16 Units into the skin 3 (three) times daily with meals. Per Sliding Scale; If Blood Sugar is less than 60, call MD. If Blood Sugar is 150 to 200, give 2 Units. If Blood Sugar is 201 to 250, give 4 Units. If Blood Sugar is 251 to 300, give 6 Units. If Blood Sugar is 301 to 350, give 8 Units. If Blood Sugar is 351 to 400, give 10 Units. If Blood Sugar is greater than 400, call MD.  subcutaneous With Meals GIVE 6 UNITS WITH MEALS IN ADDITION TO SLIDING SCALE 07/23/19  Yes Gerlene Fee, NP  Insulin Detemir (LEVEMIR FLEXTOUCH) 100 UNIT/ML Pen Inject 14 Units into the skin at bedtime. 07/23/19  Yes Gerlene Fee, NP  metoprolol tartrate (LOPRESSOR) 25 MG tablet Take 0.5 tablets (12.5 mg total) by mouth 2 (two) times daily. 07/23/19  Yes Gerlene Fee, NP  midodrine (PROAMATINE) 10 MG tablet Take 1 tablet (10 mg total) by mouth 2 (two) times daily with a meal. 07/23/19  Yes Gerlene Fee, NP  mirtazapine (REMERON) 15 MG tablet Take 1 tablet (15 mg total) by mouth at bedtime. 07/23/19  Yes Gerlene Fee, NP  mometasone-formoterol (DULERA) 100-5 MCG/ACT AERO Inhale 2 puffs into the lungs every morning. 07/23/19  Yes Gerlene Fee, NP  omeprazole (PRILOSEC OTC) 20 MG tablet Take 1 tablet (20 mg total) by mouth daily. 10/19/18  Yes Barton Dubois, MD  ondansetron (ZOFRAN) 4 MG tablet Take 1 tablet (4 mg total) by mouth every 6 (six) hours as needed for nausea. 07/23/19  Yes Gerlene Fee, NP  PROAIR HFA 108 [phone] Base) MCG/ACT inhaler Inhale 2 puffs into the lungs every 4 (four) hours as needed for wheezing or shortness of breath. 07/23/19  Yes Gerlene Fee, NP  sodium bicarbonate 650 MG tablet Take 1 tablet (650 mg total) by mouth 2 (two) times daily. 07/23/19  Yes Gerlene Fee, NP  spironolactone (ALDACTONE) 25 MG tablet Take 1 tablet (25 mg total) by mouth daily. 07/23/19  Yes Gerlene Fee, NP  warfarin (COUMADIN) 5 MG tablet Take 1.5 tablets (7.5 mg total) by mouth daily at 6 PM. Special Instructions: Take 1.5 tabs (7.5 mg daily except Wednesday) Use appropriate precautions for receiving, handling, administration, and disposal. Gloves (single) should be worn during receiving, unpacking, and placing in storage. NIOSH recommends single gloving for administration of intact tablets or capsules. Once A Day on Sun, Mon, Tue, Thu, Fri, Sat 07/23/19  Yes Green,  Phylis Bougie, NP  acetaminophen (TYLENOL) 325 MG tablet Take 2 tablets (650 mg total) by mouth every 12 (twelve) hours. Patient not taking: Reported on 08/08/2019 06/24/19   Ainsley Spinner, PA-C  NON FORMULARY Diet: Regular, NAS, Consistent Carbohydrate    [provider]  NON FORMULARY Fluid restriction: 1200cc a day Special Instructions: Special Instructions: 500cc for days, Evening 500cc and night 200cc Every Shift Day, Evening, Night    [provider]     Allergies:     Allergies  Allergen Reactions  . Codeine Nausea And Vomiting     Physical Exam:   Vitals  Blood pressure 138/69, pulse 96, temperature 97.8 F (36.6 C), temperature source Oral, resp. rate 16, height _0  (1.803 m), weight 91.6 kg, SpO2 98 %.  Physical Examination: General appearance - alert, well appearing, and in no distress  Mental status - alert, oriented to person,  (intermittent confusion and disorientation) Eyes - sclera anicteric Neck - supple, no JVD elevation , Chest - clear  to auscultation bilaterally, symmetrical air movement,  Heart - S1 and S2 normal, regular  Abdomen - soft, nontender, nondistended, no masses or organomegaly Neurological - screening mental status exam normal, neck supple without rigidity, cranial nerves II through XII intact, DTR's normal and symmetric Extremities - no significant pedal edema noted, intact peripheral pulses  Skin - warm, dry MSK-right upper extremity AV fistula with positive thrill and bruit -Uses brace on right ankle    Data Review:    CBC Recent Labs  Lab 08/08/19 1706  WBC 7.4  HGB 11.6*  HCT 37.4*  PLT 217  MCV 88.6  MCH 27.5  MCHC 31.0  RDW 15.9*  LYMPHSABS 0.9  MONOABS 0.4  EOSABS 0.0  BASOSABS 0.0   ------------------------------------------------------------------------------------------------------------------  Chemistries  Recent Labs  Lab 08/08/19 1706  NA 128*  K 4.7  CL 93*  CO2 22  GLUCOSE 501*  BUN  50*  CREATININE 7.39*  CALCIUM 8.9  AST 28  ALT 27  ALKPHOS 206*  BILITOT 0.8   ------------------------------------------------------------------------------------------------------------------ estimated creatinine clearance is 12.8 mL/min (A) (by C-G formula based on SCr of 7.39 mg/dL (H)). ------------------------------------------------------------------------------------------------------------------ No results for input(s): TSH, T4TOTAL, T3FREE, THYROIDAB in the last 72 hours.  Invalid input(s): FREET3   Coagulation profile No results for input(s): INR, PROTIME in the last 168 hours. ------------------------------------------------------------------------------------------------------------------- No results for input(s): DDIMER in the last 72 hours. -------------------------------------------------------------------------------------------------------------------  Cardiac Enzymes No results for input(s): CKMB, TROPONINI, MYOGLOBIN in the last 168 hours.  Invalid input(s): CK ------------------------------------------------------------------------------------------------------------------    Component Value Date/Time   BNP 471.0 (H) 02/11/2019 0032     ---------------------------------------------------------------------------------------------------------------  Urinalysis    Component Value Date/Time   COLORURINE YELLOW 08/08/2019 1720   APPEARANCEUR CLEAR 08/08/2019 1720   LABSPEC 1.015 08/08/2019 1720   PHURINE 7.0 08/08/2019 1720   GLUCOSEU >=500 (A) 08/08/2019 1720   HGBUR NEGATIVE 08/08/2019 1720   BILIRUBINUR NEGATIVE 08/08/2019 1720   [place] 08/08/2019 1720   PROTEINUR >=300 (A) 08/08/2019 1720   NITRITE NEGATIVE 08/08/2019 1720   LEUKOCYTESUR NEGATIVE 08/08/2019 1720    ----------------------------------------------------------------------------------------------------------------   Imaging Results:    No results found.   Radiological Exams on Admission: No results found.  DVT Prophylaxis -SCD/Coumadin AM Labs Ordered, also please review Full Orders  Family Communication: Admission, patients condition and plan of care including tests being ordered have been discussed with the patient and sister Arloa Koh who indicate understanding and agree with the plan   Code Status -  Full Code  Likely DC to  Home after HD  Condition   stable  Roxan Hockey M.D on 08/08/2019 at 8:35 PM Go to [email or web address] -  for contact info  [facility] - Office  [phone]

## 2019-08-08 NOTE — ED Triage Notes (Signed)
Pt states, "I need dialysis". Pt reports he was confused for about 3 days this past week and didn't have his dialysis. Pt reports this confusion is new and has never had this before. Pt is alert and oriented at this time. Pt went to [facility] to get his dialysis done today and he reports they won't do his dialysis because of the confusion and he needed to come to the ED for dialysis and further workup. Pt reports last dialysis was Friday before last 07/31/19.

## 2019-08-09 DIAGNOSIS — F1010 Alcohol abuse, uncomplicated: Secondary | ICD-10-CM

## 2019-08-09 DIAGNOSIS — Z992 Dependence on renal dialysis: Secondary | ICD-10-CM | POA: Diagnosis not present

## 2019-08-09 DIAGNOSIS — I5032 Chronic diastolic (congestive) heart failure: Secondary | ICD-10-CM | POA: Diagnosis not present

## 2019-08-09 DIAGNOSIS — G9341 Metabolic encephalopathy: Secondary | ICD-10-CM | POA: Diagnosis not present

## 2019-08-09 DIAGNOSIS — G4733 Obstructive sleep apnea (adult) (pediatric): Secondary | ICD-10-CM

## 2019-08-09 DIAGNOSIS — N186 End stage renal disease: Secondary | ICD-10-CM | POA: Diagnosis not present

## 2019-08-09 DIAGNOSIS — E1169 Type 2 diabetes mellitus with other specified complication: Secondary | ICD-10-CM | POA: Diagnosis not present

## 2019-08-09 DIAGNOSIS — D631 Anemia in chronic kidney disease: Secondary | ICD-10-CM | POA: Diagnosis not present

## 2019-08-09 DIAGNOSIS — N2581 Secondary hyperparathyroidism of renal origin: Secondary | ICD-10-CM | POA: Diagnosis not present

## 2019-08-09 DIAGNOSIS — I10 Essential (primary) hypertension: Secondary | ICD-10-CM

## 2019-08-09 DIAGNOSIS — E669 Obesity, unspecified: Secondary | ICD-10-CM

## 2019-08-09 LAB — CBC
HCT: 36.5 % — ABNORMAL LOW (ref 39.0–52.0)
Hemoglobin: 11.4 g/dL — ABNORMAL LOW (ref 13.0–17.0)
MCH: 27.7 pg (ref 26.0–34.0)
MCHC: 31.2 g/dL (ref 30.0–36.0)
MCV: 88.8 fL (ref 80.0–100.0)
Platelets: 220 10*3/uL (ref 150–400)
RBC: 4.11 MIL/uL — ABNORMAL LOW (ref 4.22–5.81)
RDW: 15.6 % — ABNORMAL HIGH (ref 11.5–15.5)
WBC: 6.8 10*3/uL (ref 4.0–10.5)
nRBC: 0 % (ref 0.0–0.2)

## 2019-08-09 LAB — HIV ANTIBODY (ROUTINE TESTING W REFLEX): HIV Screen 4th Generation wRfx: NONREACTIVE

## 2019-08-09 LAB — BASIC METABOLIC PANEL
Anion gap: 13 (ref 5–15)
BUN: 52 mg/dL — ABNORMAL HIGH (ref 6–20)
CO2: 21 mmol/L — ABNORMAL LOW (ref 22–32)
Calcium: 9.1 mg/dL (ref 8.9–10.3)
Chloride: 101 mmol/L (ref 98–111)
Creatinine, Ser: 7.48 mg/dL — ABNORMAL HIGH (ref 0.61–1.24)
GFR calc Af Amer: 8 mL/min — ABNORMAL LOW (ref >60)
GFR calc non Af Amer: 7 mL/min — ABNORMAL LOW (ref >60)
Glucose, Bld: 101 mg/dL — ABNORMAL HIGH (ref 70–99)
Potassium: 4.3 mmol/L (ref 3.5–5.1)
Sodium: 135 mmol/L (ref 135–145)

## 2019-08-09 LAB — CBG MONITORING, ED: Glucose-Capillary: 153 mg/dL — ABNORMAL HIGH (ref 70–99)

## 2019-08-09 LAB — PROTIME-INR
INR: 1.1 (ref 0.8–1.2)
Prothrombin Time: 13.8 seconds (ref 11.4–15.2)

## 2019-08-09 LAB — SARS CORONAVIRUS 2 (TAT 6-24 HRS): SARS Coronavirus 2: NEGATIVE

## 2019-08-09 MED ORDER — WARFARIN - PHARMACIST DOSING INPATIENT: Freq: Every day | Status: DC

## 2019-08-09 MED ORDER — WARFARIN SODIUM 5 MG PO TABS: 10.0000 mg | ORAL_TABLET | Freq: Once | ORAL | Status: DC

## 2019-08-09 NOTE — ED Notes (Signed)
Pt is becoming frustrated due to not receiving dialysis  And wants to sign out. [facility] notifed

## 2019-08-09 NOTE — Discharge Summary (Signed)
Physician Discharge Summary  Steven [name] [id number] DOB: Jan 17, 1961 DOA: 08/08/2019  PCP: Maryruth Hancock, MD  Admit date: 08/08/2019 Discharge date: 08/09/2019  Admitted From: Home Disposition: Home  Recommendations for Outpatient Follow-up:  1. Patient will follow up with his dialysis center tomorrow for his regularly scheduled dialysis.  He does not have indication for urgent dialysis at this time.  Potassium level is normal, no significant shortness of breath and blood pressure stable.  Mental status appears to be clear at this time.  Discharge Condition: Stable CODE STATUS: Full code Diet recommendation: Heart healthy  Brief/Interim Summary: 58 year old male with a history of end-stage renal disease on hemodialysis, Monday Wednesday and Friday, diabetes, was sent to the emergency room after he had missed his dialysis for the past 1 week.  Patient reports that he had been traveling in [place] and had his car breakdown.  When he returned to [facility], he was contacted by his dialysis center and since he did not have dialysis for a week, he was advised to go to the emergency room for evaluation.  On arrival to the emergency room, there was initial concern that he may have confusion.  Labs indicate a blood sugar over 500.  He was initially admitted for possible dialysis.  Patient was monitored in the emergency room his vitals remained stable.  He has not had any evidence of infection.  Blood sugars have improved since admission.  The following day, he was reevaluated and appears stable.  Vitals have been stable.  No fever.  Labs were rechecked and blood sugars have improved.  Potassium is 4.3.  He does not have any evidence of significant volume overload.  He is not short of breath and his hemodynamic status is stable.  Case was reviewed with Dr. Justin Mend on-call for nephrology who felt it was reasonable to have the patient discharged and follow-up with his regular dialysis center tomorrow.   Patient is very adamant about being discharged today.  He is alert and oriented, understands the risks of missing his dialysis with a result of possible death.  He says he will follow-up with his dialysis center tomorrow for dialysis.  He will be provided a copy of his blood work as well as copy of the discharge summary to present to his dialysis center tomorrow.  He does not need urgent dialysis at this time.  His care was reviewed with his sister who will come pick the patient up..  Patient does have a history of noncompliance and missing dialysis session.  He is followed by [facility] as well as [facility]  paramedicine program.  Discharge Diagnoses:  Principal Problem:   Acute metabolic encephalopathy Active Problems:   Diabetes mellitus type 2 in obese ([place])   Essential hypertension   OSA (obstructive sleep apnea)   ESRD on hemodialysis ([place])   Hypertension associated with stage 5 chronic kidney disease due to type 2 diabetes mellitus ([place])   Chronic diastolic heart failure ([facility])   End stage renal disease on dialysis due to type 2 diabetes mellitus ([place])   Major depression, recurrent, chronic ([place])   History of pulmonary embolus (PE)   Chronic anticoagulation   Tobacco abuse disorder--- Dips Tobbacco   Alcohol abuse    Discharge Instructions  Discharge Instructions    Diet - low sodium heart healthy   Complete by: As directed    Increase activity slowly   Complete by: As directed      Allergies as of 08/09/2019  Reactions   Codeine Nausea And Vomiting      Medication List    STOP taking these medications   spironolactone 25 MG tablet Commonly known as: ALDACTONE     TAKE these medications   acetaminophen 325 MG tablet Commonly known as: TYLENOL Take 2 tablets (650 mg total) by mouth every 12 (twelve) hours.   calcium acetate 667 MG capsule Commonly known as: PHOSLO Take 2 capsules (1,334 mg total) by mouth 3 (three) times daily with meals.   Dulera 100-5  MCG/ACT Aero Generic drug: mometasone-formoterol Inhale 2 puffs into the lungs every morning.   furosemide 80 MG tablet Commonly known as: LASIX Take 1 tablet (80 mg total) by mouth daily.   gabapentin 300 MG capsule Commonly known as: NEURONTIN Take 1 capsule (300 mg total) by mouth at bedtime.   Levemir FlexTouch 100 UNIT/ML Pen Generic drug: Insulin Detemir Inject 14 Units into the skin at bedtime.   metoprolol tartrate 25 MG tablet Commonly known as: LOPRESSOR Take 0.5 tablets (12.5 mg total) by mouth 2 (two) times daily.   midodrine 10 MG tablet Commonly known as: PROAMATINE Take 1 tablet (10 mg total) by mouth 2 (two) times daily with a meal.   mirtazapine 15 MG tablet Commonly known as: REMERON Take 1 tablet (15 mg total) by mouth at bedtime.   NON FORMULARY Diet: Regular, NAS, Consistent Carbohydrate   NON FORMULARY Fluid restriction: 1200cc a day Special Instructions: Special Instructions: 500cc for days, Evening 500cc and night 200cc Every Shift Day, Evening, Night   NovoLOG FlexPen 100 UNIT/ML FlexPen Generic drug: insulin aspart Inject 6-16 Units into the skin 3 (three) times daily with meals. Per Sliding Scale; If Blood Sugar is less than 60, call MD. If Blood Sugar is 150 to 200, give 2 Units. If Blood Sugar is 201 to 250, give 4 Units. If Blood Sugar is 251 to 300, give 6 Units. If Blood Sugar is 301 to 350, give 8 Units. If Blood Sugar is 351 to 400, give 10 Units. If Blood Sugar is greater than 400, call MD. subcutaneous With Meals GIVE 6 UNITS WITH MEALS IN ADDITION TO SLIDING SCALE   omeprazole 20 MG tablet Commonly known as: PRILOSEC OTC Take 1 tablet (20 mg total) by mouth daily.   ondansetron 4 MG tablet Commonly known as: ZOFRAN Take 1 tablet (4 mg total) by mouth every 6 (six) hours as needed for nausea.   ProAir HFA 108 (90 Base) MCG/ACT inhaler Generic drug: albuterol Inhale 2 puffs into the lungs every 4 (four) hours as needed for wheezing  or shortness of breath.   sodium bicarbonate 650 MG tablet Take 1 tablet (650 mg total) by mouth 2 (two) times daily.   warfarin 5 MG tablet Commonly known as: COUMADIN Take 1.5 tablets (7.5 mg total) by mouth daily at 6 PM. Special Instructions: Take 1.5 tabs (7.5 mg daily except Wednesday) Use appropriate precautions for receiving, handling, administration, and disposal. Gloves (single) should be worn during receiving, unpacking, and placing in storage. NIOSH recommends single gloving for administration of intact tablets or capsules. Once A Day on Sun, Mon, Tue, Thu, Fri, Sat      Follow-up Information    Dialysis, [facility] Follow up.   Why: return to dialysis center tomorrow for regular dialysis session Contact information: [place] [facility] [phone]          Allergies  Allergen Reactions  . Codeine Nausea And Vomiting    Consultations:  Nephrology, Dr. Justin Mend (phone)   Procedures/Studies:  No results found.    Subjective: Patient denies any shortness of breath.  Denies any chest pain.  He is awake and alert.  He knows that he is in the hospital and he knows the day/month/year.  He is aware of his medical history.  He understands the risks of missing dialysis with the possibility of death.  He is very insistent on being discharged home and will follow up with his dialysis center tomorrow.  Discharge Exam: Vitals:   08/08/19 2300 08/09/19 0000 08/09/19 0230 08/09/19 0812  BP: (!) 153/86 128/78 111/64   Pulse: 90  67   Resp: 17 17 14    Temp:      TempSrc:      SpO2: 96% 100% 100% 98%  Weight:      Height:        General: Pt is alert, awake, not in acute distress Cardiovascular: RRR, S1/S2 +, no rubs, no gallops Respiratory: CTA bilaterally, no wheezing, no rhonchi Abdominal: Soft, NT, ND, bowel sounds + Extremities: no edema, no cyanosis    The results of significant diagnostics from this hospitalization (including imaging,  microbiology, ancillary and laboratory) are listed below for reference.     Microbiology: Recent Results (from the past 240 hour(s))  SARS CORONAVIRUS 2 (TAT 6-24 HRS) Nasopharyngeal Nasopharyngeal Swab     Status: None   Collection Time: 08/08/19  6:25 PM   Specimen: Nasopharyngeal Swab  Result Value Ref Range Status   SARS Coronavirus 2 NEGATIVE NEGATIVE Final    Comment: (NOTE) SARS-CoV-2 target nucleic acids are NOT DETECTED. The SARS-CoV-2 RNA is generally detectable in upper and lower respiratory specimens during the acute phase of infection. Negative results do not preclude SARS-CoV-2 infection, do not rule out co-infections with other pathogens, and should not be used as the sole basis for treatment or other patient management decisions. Negative results must be combined with clinical observations, patient history, and epidemiological information. The expected result is Negative. Fact Sheet for Patients: SugarRoll.be Fact Sheet for Healthcare Providers: https://www.woods-mathews.com/ This test is not yet approved or cleared by the [place] FDA and  has been authorized for detection and/or diagnosis of SARS-CoV-2 by FDA under an Emergency Use Authorization (EUA). This EUA will remain  in effect (meaning this test can be used) for the duration of the COVID-19 declaration under Section 56 4(b)(1) of the Act, 21 U.S.C. section 360bbb-3(b)(1), unless the authorization is terminated or revoked sooner. Performed at [facility], [place] [address] [place], [place] [address]      Labs: BNP (last 3 results) Recent Labs    02/11/19 0032  BNP 123XX123*   Basic Metabolic Panel: Recent Labs  Lab 08/08/19 1706 08/09/19 0523  NA 128* 135  K 4.7 4.3  CL 93* 101  CO2 22 21*  GLUCOSE 501* 101*  BUN 50* 52*  CREATININE 7.39* 7.48*  CALCIUM 8.9 9.1   Liver Function Tests: Recent Labs  Lab 08/08/19 1706  AST 28  ALT 27   ALKPHOS 206*  BILITOT 0.8  PROT 7.6  ALBUMIN 3.6   No results for input(s): LIPASE, AMYLASE in the last 168 hours. Recent Labs  Lab 08/08/19 1706  AMMONIA 17   CBC: Recent Labs  Lab 08/08/19 1706 08/09/19 0523  WBC 7.4 6.8  NEUTROABS 6.0  --   HGB 11.6* 11.4*  HCT 37.4* 36.5*  MCV 88.6 88.8  PLT 217 220   Cardiac Enzymes: No results for  input(s): CKTOTAL, CKMB, CKMBINDEX, TROPONINI in the last 168 hours. BNP: Invalid input(s): POCBNP CBG: Recent Labs  Lab 08/08/19 1712 08/08/19 2200 08/09/19 0836  GLUCAP 464* 363* 153*   D-Dimer No results for input(s): DDIMER in the last 72 hours. Hgb A1c Recent Labs    08/08/19 1706  HGBA1C 7.5*   Lipid Profile No results for input(s): CHOL, HDL, LDLCALC, TRIG, CHOLHDL, LDLDIRECT in the last 72 hours. Thyroid function studies No results for input(s): TSH, T4TOTAL, T3FREE, THYROIDAB in the last 72 hours.  Invalid input(s): FREET3 Anemia work up No results for input(s): VITAMINB12, FOLATE, FERRITIN, TIBC, IRON, RETICCTPCT in the last 72 hours. Urinalysis    Component Value Date/Time   COLORURINE YELLOW 08/08/2019 [place] 08/08/2019 1720   LABSPEC 1.015 08/08/2019 1720   PHURINE 7.0 08/08/2019 1720   GLUCOSEU >=500 (A) 08/08/2019 1720   HGBUR NEGATIVE 08/08/2019 1720   BILIRUBINUR NEGATIVE 08/08/2019 1720   KETONESUR NEGATIVE 08/08/2019 1720   PROTEINUR >=300 (A) 08/08/2019 1720   NITRITE NEGATIVE 08/08/2019 1720   LEUKOCYTESUR NEGATIVE 08/08/2019 1720   Sepsis Labs Invalid input(s): PROCALCITONIN,  WBC,  LACTICIDVEN Microbiology Recent Results (from the past 240 hour(s))  SARS CORONAVIRUS 2 (TAT 6-24 HRS) Nasopharyngeal Nasopharyngeal Swab     Status: None   Collection Time: 08/08/19  6:25 PM   Specimen: Nasopharyngeal Swab  Result Value Ref Range Status   SARS Coronavirus 2 NEGATIVE NEGATIVE Final    Comment: (NOTE) SARS-CoV-2 target nucleic acids are NOT DETECTED. The SARS-CoV-2 RNA is  generally detectable in upper and lower respiratory specimens during the acute phase of infection. Negative results do not preclude SARS-CoV-2 infection, do not rule out co-infections with other pathogens, and should not be used as the sole basis for treatment or other patient management decisions. Negative results must be combined with clinical observations, patient history, and epidemiological information. The expected result is Negative. Fact Sheet for Patients: SugarRoll.be Fact Sheet for Healthcare Providers: https://www.woods-mathews.com/ This test is not yet approved or cleared by the [place] FDA and  has been authorized for detection and/or diagnosis of SARS-CoV-2 by FDA under an Emergency Use Authorization (EUA). This EUA will remain  in effect (meaning this test can be used) for the duration of the COVID-19 declaration under Section 56 4(b)(1) of the Act, 21 U.S.C. section 360bbb-3(b)(1), unless the authorization is terminated or revoked sooner. Performed at [facility], [place] [address], [place], [place] [address]      Time coordinating discharge: 62mins  SIGNED:   Kathie Dike, MD  [facility] 08/09/2019, 11:36 AM   If 7PM-7AM, please contact night-coverage [email or web address]

## 2019-08-09 NOTE — Progress Notes (Signed)
ANTICOAGULATION CONSULT NOTE - Initial Up Consult   Pharmacy Consult for warfarin dosing  Indication: atrial fibrillation   Allergies  Allergen Reactions  . Codeine Nausea And Vomiting      Patient Measurements: Last Weight  Most recent update: 08/08/2019  4:46 PM   Weight  91.6 kg (202 lb)           Body mass index is 28.17 kg/m. Steven [name]               BP: 111/64 (11/01 0230) Pulse Rate: 67 (11/01 0230)  Labs: Recent Labs    08/08/19 1706 08/09/19 0523  HGB 11.6* 11.4*  HCT 37.4* 36.5*  PLT 217 220  LABPROT  --  13.8  INR  --  1.1  CREATININE 7.39* 7.48*    Estimated Creatinine Clearance: 12.6 mL/min (A) (by C-G formula based on SCr of 7.48 mg/dL (H)).     Medications:  (Not in a hospital admission)  Scheduled:  . acetaminophen  650 mg Oral Q12H  . calcium acetate  1,334 mg Oral TID WC  . gabapentin  300 mg Oral QHS  . insulin aspart  0-15 Units Subcutaneous TID WC  . insulin aspart  0-5 Units Subcutaneous QHS  . insulin aspart  3 Units Subcutaneous TID WC  . insulin detemir  12 Units Subcutaneous Once  . insulin detemir  14 Units Subcutaneous QHS  . metoprolol tartrate  12.5 mg Oral BID  . midodrine  10 mg Oral BID WC  . mirtazapine  15 mg Oral QHS  . mometasone-formoterol  2 puff Inhalation BH-q7a  . pantoprazole  40 mg Oral Daily  . sodium bicarbonate  650 mg Oral BID  . sodium chloride flush  3 mL Intravenous Q12H  . spironolactone  25 mg Oral Daily  . warfarin  10 mg Oral Once  . Warfarin - Pharmacist Dosing Inpatient   Does not apply q1800   Infusions:  . sodium chloride     PRN: sodium chloride, acetaminophen **OR** acetaminophen, albuterol, albuterol, labetalol, ondansetron **OR** ondansetron (ZOFRAN) IV, polyethylene glycol, sodium chloride flush, traZODone Anti-infectives (From admission, onward)   None      Goal of Therapy:  INR 2-3 Monitor platelets by anticoagulation protocol: Yes    Prior to Admission Warfarin  Dosing:     Monday:7.5mg   Tuesday:7.5mg   Wednesday:11.25mg   Thursday:7.5mg   Friday:7.5mg   Saturday:7.5mg   Sunday:7.5mg    Admit INR was 1.1 Lab Results  Component Value Date   INR 1.1 08/09/2019   INR 2.7 (H) 07/14/2019   INR 1.6 (H) 07/07/2019    Assessment: Steven [name] a 58 y.o. male requires anticoagulation with warfarin for the indication of  hx of PE requiring chronic AC. Warfarin will be initiated inpatient following pharmacy protocol per pharmacy consult. Patient most recent blood work is as follows: CBC Latest Ref Rng & Units 08/09/2019 08/08/2019 06/26/2019  WBC 4.0 - 10.5 K/uL 6.8 7.4 4.8  Hemoglobin 13.0 - 17.0 g/dL 11.4(L) 11.6(L) 9.2(L)  Hematocrit 39.0 - 52.0 % 36.5(L) 37.4(L) 28.7(L)  Platelets 150 - 400 K/uL 220 217 240     Plan: Warfarin 10mg  po x1 this morning.  Monitor CBC daily with am labs   Monitor INR daily Monitor for signs and symptoms of bleeding   [name], PharmD, MBA, BCGP Clinical Pharmacist

## 2019-08-09 NOTE — ED Notes (Signed)
Called patient's sister. Sister is on the way to pick up patient.

## 2019-08-10 ENCOUNTER — Other Ambulatory Visit: Payer: Self-pay | Admitting: *Deleted

## 2019-08-10 NOTE — Patient Outreach (Signed)
Telephone outreach unsuccessful. Pt phone is not connected. Called pt sister and left a message that I am trying to reach him and requested a return call at his convenience.  Will call again on Thursday.  [name]. [name], MSN, [facility] Gerontological Nurse Practitioner [facility] Care Management [phone]

## 2019-08-10 NOTE — Patient Outreach (Signed)
Telephone call received from [facility] with [employer]. States she will plan outreach to Mr. Sandefer to attempt engagement for services.   Will update [facility] Care Management team.   Marthenia Rolling, [place], RN,BSN [place] Acute Care Coordinator [phone] [facility]) [phone]  (Toll free office)

## 2019-08-11 ENCOUNTER — Inpatient Hospital Stay: Payer: Medicare Other | Admitting: Family Medicine

## 2019-08-13 ENCOUNTER — Other Ambulatory Visit: Payer: Self-pay | Admitting: *Deleted

## 2019-08-13 NOTE — Patient Outreach (Signed)
Telphone outreach successful today.  [name] says he is doing great and is having no acute problems.  He reports his current wt to be 208 (he has lost 175#).  His FBS levels are running between 120-140.  He denies any SOB, wheezing.  [facility] CM Care Plan Problem One     Most Recent Value  Care Plan Problem One  No Advanced Directives  (Pended)   Role Documenting the Problem One  Care Management Coordinator  (Pended)   Care Plan for Problem One  Active  (Pended)   [facility] CM Short Term Goal #1   Pt will recieve and complete his advanced directives over the next 30 days as evidenced in his pt chart.  (Pended)   [facility] CM Short Term Goal #1 Start Date  07/30/19  (Pended)     [facility] CM Care Plan Problem Two     Most Recent Value  Care Plan Problem Two  Has difficulty with transportation all the time to dialysis tues, thurs and saturdays.  (Pended)   Role Documenting the Problem Two  Care Management Coordinator  (Pended)   Care Plan for Problem Two  Active  (Pended)   [facility] CM Short Term Goal #1   Pt will ask insurance agent to find plan that offers transportation over the next 30 days.  (Pended)   [facility] CM Short Term Goal #1 Start Date  07/30/19  (Pended)     [facility] CM Care Plan Problem Three     Most Recent Value  Care Plan Problem Three  Diabetes Managment Has not been in control until last measurement of HgbA1C which was 6.1  (Pended)   Role Documenting the Problem Three  Care Management Coordinator  (Pended)   Care Plan for Problem Three  Active  (Pended)   [facility] Long Term Goal   Pt Hgb A1C will not exceed 7.0 on next evaluation in December.  (Pended)   [facility]N Long Term Goal Start Date  07/30/19  (Pended)      I will call him again in 2 weeks.  [name]. [name], MSN, [facility] Gerontological Nurse Practitioner [facility] Care Management [phone]

## 2019-08-24 DIAGNOSIS — N2581 Secondary hyperparathyroidism of renal origin: Secondary | ICD-10-CM | POA: Diagnosis not present

## 2019-08-24 DIAGNOSIS — D631 Anemia in chronic kidney disease: Secondary | ICD-10-CM | POA: Diagnosis not present

## 2019-08-24 DIAGNOSIS — Z992 Dependence on renal dialysis: Secondary | ICD-10-CM | POA: Diagnosis not present

## 2019-08-24 DIAGNOSIS — N186 End stage renal disease: Secondary | ICD-10-CM | POA: Diagnosis not present

## 2019-08-26 ENCOUNTER — Ambulatory Visit (INDEPENDENT_AMBULATORY_CARE_PROVIDER_SITE_OTHER): Payer: Medicare Other | Admitting: Family Medicine

## 2019-08-26 ENCOUNTER — Other Ambulatory Visit: Payer: Self-pay

## 2019-08-26 ENCOUNTER — Encounter: Payer: Self-pay | Admitting: Family Medicine

## 2019-08-26 VITALS — BP 130/81 | HR 98 | Temp 97.8°F | Ht 71.0 in | Wt 188.4 lb

## 2019-08-26 DIAGNOSIS — F1010 Alcohol abuse, uncomplicated: Secondary | ICD-10-CM

## 2019-08-26 DIAGNOSIS — N186 End stage renal disease: Secondary | ICD-10-CM

## 2019-08-26 DIAGNOSIS — I5032 Chronic diastolic (congestive) heart failure: Secondary | ICD-10-CM

## 2019-08-26 DIAGNOSIS — D631 Anemia in chronic kidney disease: Secondary | ICD-10-CM

## 2019-08-26 DIAGNOSIS — Z992 Dependence on renal dialysis: Secondary | ICD-10-CM

## 2019-08-26 DIAGNOSIS — N185 Chronic kidney disease, stage 5: Secondary | ICD-10-CM

## 2019-08-26 DIAGNOSIS — I120 Hypertensive chronic kidney disease with stage 5 chronic kidney disease or end stage renal disease: Secondary | ICD-10-CM

## 2019-08-26 DIAGNOSIS — J449 Chronic obstructive pulmonary disease, unspecified: Secondary | ICD-10-CM

## 2019-08-26 DIAGNOSIS — R197 Diarrhea, unspecified: Secondary | ICD-10-CM | POA: Diagnosis not present

## 2019-08-26 DIAGNOSIS — E1122 Type 2 diabetes mellitus with diabetic chronic kidney disease: Secondary | ICD-10-CM

## 2019-08-26 DIAGNOSIS — G4733 Obstructive sleep apnea (adult) (pediatric): Secondary | ICD-10-CM

## 2019-08-26 NOTE — Progress Notes (Signed)
Established Patient Office Visit  Subjective:  Patient ID: Steven [name], male    DOB: [birth year]  Age: 58 y.o. MRN: [id number]  CC:  Chief Complaint  Patient presents with  . Follow-up    dm  pt using basal insulin + sliding scale. Pt is not checking glucose on a regular basis or keeping dialysis appts Pt eats out primarily since returning home-previously in a nursing facility due to fracture.  SW attending appt with pt in an attempt to coordinate services  Prescriptions  Total Prescriptions: 2   Total Private Pay: 0   Fill Date ID   Written Drug Qty Days Prescriber Rx # Pharmacy Refill   Daily Dose* Pymt Type PMP    01/31/2019  1   01/30/2019  Tramadol Hcl 50 MG Tablet  15.00  3 Ar Gou   [id number]   Wal (0019)   0  25.00 MME  Comm Ins   [place]  01/30/2019  1   01/30/2019  Oxycodone-Acetaminophen 5-325  45.00  5 Gr Sch   [id number]   Wal (0019)   0  67.50 MME  Comm Ins   [place]   HPI WOODRUFF DEGOLIER presents for follow up after episodes of memory loss where he was eventually picked up in [place] by his sister. . Social worker is with pt.  Pt previously in a nursing facility now living on his own. Pt does retain his drivers license. Pt has missed dialysis for several weeks.  Pt admits to drinking several times a week 48 oz beer.. pt denies injury prior to  being found in [place] with no recollection of events.  CT head 9/20.   Fasting glucose-80-120-meal replacement glucerna-" dont feel like eating" . Cinnamon bun from [employer] last night was the last meal. Pt with no regular meals-does not cook so eats out often. Pt states he has not had a recent eye exam.  Pt denies visual changes. Pt being followed by network to assist with social service connections, medications, appointments and therapy.    Past Medical History:  Diagnosis Date  . 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 Alcohol abuse   . Anemia   . Asthma   . Cellulitis and abscess of right lower extremity 03/01/2017  . CHF  (congestive heart failure) ([place])   . Chronic kidney disease    dialysis m,w,f  . Cognitive communication deficit   . COPD (chronic obstructive pulmonary disease) ([place])   . Coronary artery disease   . Diabetes mellitus without complication ([place])   . Edema extremities 03/01/2017   bilateral swelling  . GERD (gastroesophageal reflux disease)   . High cholesterol   . High triglycerides   . Hyperlipemia   . Hypertension   . Hypothyroidism   . Major depressive disorder   . Muscle weakness   . Neuropathy   . OSA (obstructive sleep apnea)   . Other abnormalities of gait and mobility   . Peripheral vascular disease ([place])   . Pneumonia   . Sleep apnea    can't use cpap d/t feelings of suffocation    Past Surgical History:  Procedure Laterality Date  . A/V FISTULAGRAM Right 08/27/2017   Procedure: A/V FISTULAGRAM;  Surgeon: Katha Cabal, MD;  Location: [facility];  Service: Cardiovascular;  Laterality: Right;  . A/V FISTULAGRAM Right 11/04/2018   Procedure: A/V FISTULAGRAM;  Surgeon: Katha Cabal, MD;  Location: [facility] [facility];  Service: Cardiovascular;  Laterality: Right;  . A/V SHUNT INTERVENTION N/A 06/23/2018   Procedure: A/V SHUNT INTERVENTION;  Surgeon: Algernon Huxley, MD;  Location: [facility] [place] [facility];  Service: Cardiovascular;  Laterality: N/A;  . APPENDECTOMY  2010  . AV FISTULA PLACEMENT Right 06/14/2017   Procedure: ARTERIOVENOUS (AV) FISTULA CREATION WRIST;  Surgeon: Katha Cabal, MD;  Location: [facility];  Service: Vascular;  Laterality: Right;  . DIALYSIS/PERMA CATHETER INSERTION N/A 03/05/2017   Procedure: Dialysis/Perma Catheter Insertion;  Surgeon: Katha Cabal, MD;  Location: [place] [facility];  Service: Cardiovascular;  Laterality: N/A;  . DIALYSIS/PERMA CATHETER INSERTION N/A 09/20/2017   Procedure: DIALYSIS/PERMA CATHETER INSERTION;  Surgeon: Katha Cabal, MD;  Location: [place] [facility];  Service: Cardiovascular;   Laterality: N/A;  . DIALYSIS/PERMA CATHETER INSERTION N/A 01/26/2019   Procedure: DIALYSIS/PERMA CATHETER INSERTION/Exchange;  Surgeon: Algernon Huxley, MD;  Location: [place] [facility];  Service: Cardiovascular;  Laterality: N/A;  . DIALYSIS/PERMA CATHETER INSERTION N/A 01/27/2019   Procedure: DIALYSIS/PERMA CATHETER INSERTION;  Surgeon: Katha Cabal, MD;  Location: [facility];  Service: Cardiovascular;  Laterality: N/A;  . EXCHANGE OF A DIALYSIS CATHETER  03/29/2017   Procedure: Exchange Of A Dialysis Catheter;  Surgeon: Katha Cabal, MD;  Location: [facility];  Service: Cardiovascular;;  . IRRIGATION AND DEBRIDEMENT FOOT Right 03/01/2017   Procedure: IRRIGATION AND DEBRIDEMENT FOOT;  Surgeon: Albertine Patricia, DPM;  Location: [facility];  Service: Podiatry;  Laterality: Right;  application of wound vac  . ORIF ANKLE FRACTURE Right 06/19/2019   Procedure: OPEN REDUCTION INTERNAL FIXATION (ORIF) ANKLE FRACTURE;  Surgeon: Altamese Lake Worth, MD;  Location: [facility];  Service: Orthopedics;  Laterality: Right;  . PERIPHERAL VASCULAR THROMBECTOMY Right 06/18/2018   Procedure: PERIPHERAL VASCULAR THROMBECTOMY;  Surgeon: Algernon Huxley, MD;  Location: [facility];  Service: Cardiovascular;  Laterality: Right;  . REPAIR NON-UNION ULNA Right 06/19/2019   Procedure: Repair Non-Union right tibia;  Surgeon: Altamese Andrew, MD;  Location: [facility];  Service: Orthopedics;  Laterality: Right;  Marland Kitchen VASCULAR ACCESS DEVICE INSERTION Right 01/30/2019   Procedure: INSERTION OF HERO VASCULAR ACCESS DEVICE;  Surgeon: Katha Cabal, MD;  Location: [facility];  Service: Vascular;  Laterality: Right;    Family History  Problem Relation Age of Onset  . CAD Father   . Heart disease Father     Social History   Socioeconomic History  . Marital status: Divorced    Spouse name: Not on file  . Number of children: 1  . Years of education: Not on file  . Highest education level: Not on file   Occupational History  . Occupation: disability  Social Needs  . Financial resource strain: Not very hard  . Food insecurity    Worry: Never true    Inability: Never true  . Transportation needs    Medical: No    Non-medical: No  Tobacco Use  . Smoking status: Former Research scientist (life sciences)  . Smokeless tobacco: Current User    Types: Snuff  Substance and Sexual Activity  . Alcohol use: Yes    Comment: up until 01/2017 was heavy drinker...1-2 beers a couple times a week now  . Drug use: Never  . Sexual activity: Never  Lifestyle  . Physical activity    Days per week: 0 days    Minutes per session: 0 min  . Stress: Not at all  Relationships  . Social connections    Talks on phone: More than  three times a week    Gets together: More than three times a week    Attends religious service: Never    Active member of club or organization: No    Attends meetings of clubs or organizations: Never    Relationship status: Divorced  . Intimate partner violence    Fear of current or ex partner: No    Emotionally abused: No    Physically abused: No    Forced sexual activity: No  Other Topics Concern  . Not on file  Social History Narrative   Lives with his 70 year old son. Has a caregiver that helps with some meals and housekeeping.    Outpatient Medications Prior to Visit  Medication Sig Dispense Refill  . acetaminophen (TYLENOL) 325 MG tablet Take 2 tablets (650 mg total) by mouth every 12 (twelve) hours. (Patient not taking: Reported on 08/08/2019) 60 tablet 0  . calcium acetate (PHOSLO) 667 MG capsule Take 2 capsules (1,334 mg total) by mouth 3 (three) times daily with meals. 180 capsule 0  . furosemide (LASIX) 80 MG tablet Take 1 tablet (80 mg total) by mouth daily. 30 tablet 0  . gabapentin (NEURONTIN) 300 MG capsule Take 1 capsule (300 mg total) by mouth at bedtime. 30 capsule 0  . insulin aspart (NOVOLOG FLEXPEN) 100 UNIT/ML FlexPen Inject 6-16 Units into the skin 3 (three) times daily with  meals. Per Sliding Scale; If Blood Sugar is less than 60, call MD. If Blood Sugar is 150 to 200, give 2 Units. If Blood Sugar is 201 to 250, give 4 Units. If Blood Sugar is 251 to 300, give 6 Units. If Blood Sugar is 301 to 350, give 8 Units. If Blood Sugar is 351 to 400, give 10 Units. If Blood Sugar is greater than 400, call MD. subcutaneous With Meals GIVE 6 UNITS WITH MEALS IN ADDITION TO SLIDING SCALE 15 mL 0  . Insulin Detemir (LEVEMIR FLEXTOUCH) 100 UNIT/ML Pen Inject 14 Units into the skin at bedtime. 15 mL 0  . metoprolol tartrate (LOPRESSOR) 25 MG tablet Take 0.5 tablets (12.5 mg total) by mouth 2 (two) times daily. 30 tablet 0  . midodrine (PROAMATINE) 10 MG tablet Take 1 tablet (10 mg total) by mouth 2 (two) times daily with a meal. 60 tablet 1  . mirtazapine (REMERON) 15 MG tablet Take 1 tablet (15 mg total) by mouth at bedtime. 30 tablet 0  . mometasone-formoterol (DULERA) 100-5 MCG/ACT AERO Inhale 2 puffs into the lungs every morning. 8.8 g 0  . NON FORMULARY Diet: Regular, NAS, Consistent Carbohydrate    . NON FORMULARY Fluid restriction: 1200cc a day Special Instructions: Special Instructions: 500cc for days, Evening 500cc and night 200cc Every Shift Day, Evening, Night    . omeprazole (PRILOSEC OTC) 20 MG tablet Take 1 tablet (20 mg total) by mouth daily. 30 tablet 1  . ondansetron (ZOFRAN) 4 MG tablet Take 1 tablet (4 mg total) by mouth every 6 (six) hours as needed for nausea. 20 tablet 0  . PROAIR HFA 108 (90 Base) MCG/ACT inhaler Inhale 2 puffs into the lungs every 4 (four) hours as needed for wheezing or shortness of breath. 6.7 g 0  . sodium bicarbonate 650 MG tablet Take 1 tablet (650 mg total) by mouth 2 (two) times daily. 60 tablet 0  . warfarin (COUMADIN) 5 MG tablet Take 1.5 tablets (7.5 mg total) by mouth daily at 6 PM. Special Instructions: Take 1.5 tabs (7.5 mg daily except Wednesday)  Use appropriate precautions for receiving, handling, administration, and disposal.  Gloves (single) should be worn during receiving, unpacking, and placing in storage. NIOSH recommends single gloving for administration of intact tablets or capsules. Once A Day on Sun, Mon, Tue, Thu, Fri, Sat 45 tablet 0   No facility-administered medications prior to visit.     Allergies  Allergen Reactions  . Codeine Nausea And Vomiting    ROS Review of Systems  Constitutional: Positive for fatigue. Negative for fever.  HENT: Negative.   Eyes:       No recent diabetic eye exam  Respiratory: Negative.   Gastrointestinal: Negative.        Diarrhea-noted over the past few months [place]-no blood   Endocrine:       DM  Genitourinary:       Dialysis-worsening Cr and GFR  Musculoskeletal:       Fx with admission to nursing facility  Skin:       Cellulitis in the past  Psychiatric/Behavioral: Positive for suicidal ideas.       Alcohol addiction      Objective:    Physical Exam  Constitutional: He is oriented to person, place, and time. He appears well-developed and well-nourished.  HENT:  Head: Normocephalic and atraumatic.  Right Ear: External ear normal.  Eyes: Conjunctivae are normal.  Neck: Normal range of motion. Neck supple.  Cardiovascular: Normal rate, regular rhythm and normal heart sounds.  Pulmonary/Chest: Effort normal and breath sounds normal.  Abdominal: Soft. Bowel sounds are normal.  Neurological: He is oriented to person, place, and time.    BP 130/81   Pulse 98   Temp 97.8 F (36.6 C) (Oral)   Ht 5\' 11"  (1.803 m)   Wt 188 lb 6.4 oz (85.5 kg)   SpO2 98%   BMI 26.28 kg/m  Wt Readings from Last 3 Encounters:  08/26/19 188 lb 6.4 oz (85.5 kg)  08/08/19 202 lb (91.6 kg)  07/23/19 234 lb (106.1 kg)     Health Maintenance Due  Topic Date Due  . Hepatitis C Screening  1961/09/12  . OPHTHALMOLOGY EXAM  12/30/1971  . URINE MICROALBUMIN  12/30/1971  . COLONOSCOPY  12/30/2011  . FOOT EXAM  02/27/2018     Lab Results  Component Value Date    TSH 3.725 02/12/2019   Lab Results  Component Value Date   WBC 6.8 08/09/2019   HGB 11.4 (L) 08/09/2019   HCT 36.5 (L) 08/09/2019   MCV 88.8 08/09/2019   PLT 220 08/09/2019   Lab Results  Component Value Date   NA 135 08/09/2019   K 4.3 08/09/2019   CO2 21 (L) 08/09/2019   GLUCOSE 101 (H) 08/09/2019   BUN 52 (H) 08/09/2019   CREATININE 7.48 (H) 08/09/2019   BILITOT 0.8 08/08/2019   ALKPHOS 206 (H) 08/08/2019   AST 28 08/08/2019   ALT 27 08/08/2019   PROT 7.6 08/08/2019   ALBUMIN 3.6 08/08/2019   CALCIUM 9.1 08/09/2019   ANIONGAP 13 08/09/2019   Lab Results  Component Value Date   CHOL 230 (H) 02/13/2019   Lab Results  Component Value Date   HDL 34 (L) 02/13/2019   Lab Results  Component Value Date   LDLCALC 157 (H) 02/13/2019   Lab Results  Component Value Date   TRIG 196 (H) 02/13/2019   Lab Results  Component Value Date   CHOLHDL 6.8 02/13/2019   Lab Results  Component Value Date   HGBA1C 7.5 (H) 08/08/2019  Assessment & Plan:  1. End stage renal disease ([place]) D/w pt at length concern about drinking and skipping dialysis-Mini Cog normal, CT normal-referral made to stabilize. - Ambulatory referral to Nephrology-d/w pt if he chooses not to go to dialysis the outcome for overall health is poor-pt agrees to return to dialysis and see nephrology for treatment evaluation.pt has seen nephrology in the past. Concern for memory loss related to health status since not keeping dialysis appointments - Ambulatory referral to diabetic education-pt needs insight into renal diet Pt still taking melatonin for sleep 2. Diarrhea, unspecified type - Ambulatory referral to Gastroenterology-ongoing concerns-alcoholism, not maintaining dialysis schedule, questionable diet - Ambulatory referral to diabetic education 3. 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 Pt  admits to not going to dialysis over the past few weeks.  - Ambulatory referral to diabetic education Pt needs to check glucose readings fasting and before meals-record for discussion and adjustment of medication.  4. Alcohol abuse Ongoing concerns with memory loss and not keeping dialysis appointment-alcohol and ammonia level negative on ER visit. D/w pt advised no alcohol use.  Pt now living alone and driving. 5. COPD, mild (HCC) dulera and proventil rescue inhaler 6. Chronic diastolic heart failure (HCC) No recent evaluation-cardiology referral-on review of chart not recent follow up-pt currently on coumadin for PE/DVT  And lasix  daily 7. Hypertension associated with stage 5 chronic kidney disease due to type 2 diabetes mellitus (HCC) Metoprolol daily-close observation during dialysis-stable today  8. OSA (obstructive sleep apnea)  9. ESRD on hemodialysis ([place]) Pt has not been attending dialysis appointments-SW will check with dialysis for future appointment as pt would like to continue to complete dialysis 10. Anemia due to chronic kidney disease, on chronic dialysis (HCC) Hemoglobin slightly low but stable Follow-up: virtual visit for review of glucose readings, follow up on specialty appts  For ongoing concerns  LISA Hannah Beat, MD

## 2019-08-26 NOTE — Patient Instructions (Addendum)
Diabetic socks  Nephrology referral (kidney doctor) Gastroenterology referral Nutritional referral Cardiology referral-No recent appt when chart reviewed-pt currently on coumadin and needs monitoring

## 2019-08-27 ENCOUNTER — Ambulatory Visit: Payer: Medicare Other | Admitting: Family Medicine

## 2019-08-28 ENCOUNTER — Encounter: Payer: Self-pay | Admitting: *Deleted

## 2019-08-28 ENCOUNTER — Other Ambulatory Visit: Payer: Self-pay | Admitting: *Deleted

## 2019-08-28 NOTE — Patient Outreach (Signed)
Telephone outreach, very brief, pt is always anxious to get off the phone.  He denies any new problems today.  Tried to discuss advanced directives again and he changed the subject.  Asked if the [facility] paramedicine team had visited which he denied.  Note pt wti is 188 down approximately 200#. Goes to dialysis 3X week.  Last Hbg A1C was 7.5 which was surprising because his individual glucose readings throughout the day are usually 200-400.  Last creatinine was 7.48  Discussed health plans. Suggest pt look into [facility] to find the best plan for his needs. There are some plans which have some transportation services which seems to be a barrier for him.  I will call him in another 2 weeks and probably close his case due to lack of interest.  Kayleen Memos C. [name], MSN, [facility] Gerontological Nurse Practitioner [facility] Care Management [phone]

## 2019-09-01 ENCOUNTER — Telehealth: Payer: Self-pay | Admitting: Family Medicine

## 2019-09-01 ENCOUNTER — Encounter: Payer: Self-pay | Admitting: Gastroenterology

## 2019-09-01 NOTE — Telephone Encounter (Signed)
Tyron Russell the social worker that came in with the patient at his last visit is calling and states she visited the patient today and states he is not taking his medication or checking blood sugar, he is also still getting lost and has misplaced his wallet. Social worker just wanted office aware.

## 2019-09-02 NOTE — Telephone Encounter (Signed)
Routing to Dr. Corum 

## 2019-09-04 DIAGNOSIS — S199XXA Unspecified injury of neck, initial encounter: Secondary | ICD-10-CM | POA: Diagnosis not present

## 2019-09-04 DIAGNOSIS — E1165 Type 2 diabetes mellitus with hyperglycemia: Secondary | ICD-10-CM | POA: Diagnosis not present

## 2019-09-04 DIAGNOSIS — S22052A Unstable burst fracture of T5-T6 vertebra, initial encounter for closed fracture: Secondary | ICD-10-CM | POA: Diagnosis not present

## 2019-09-04 DIAGNOSIS — S22050A Wedge compression fracture of T5-T6 vertebra, initial encounter for closed fracture: Secondary | ICD-10-CM | POA: Diagnosis not present

## 2019-09-04 DIAGNOSIS — S22080A Wedge compression fracture of T11-T12 vertebra, initial encounter for closed fracture: Secondary | ICD-10-CM | POA: Diagnosis not present

## 2019-09-04 DIAGNOSIS — S22070A Wedge compression fracture of T9-T10 vertebra, initial encounter for closed fracture: Secondary | ICD-10-CM | POA: Diagnosis not present

## 2019-09-04 DIAGNOSIS — S27321A Contusion of lung, unilateral, initial encounter: Secondary | ICD-10-CM | POA: Diagnosis not present

## 2019-09-04 DIAGNOSIS — I10 Essential (primary) hypertension: Secondary | ICD-10-CM | POA: Diagnosis not present

## 2019-09-04 DIAGNOSIS — S2242XA Multiple fractures of ribs, left side, initial encounter for closed fracture: Secondary | ICD-10-CM | POA: Diagnosis not present

## 2019-09-04 DIAGNOSIS — R0902 Hypoxemia: Secondary | ICD-10-CM | POA: Diagnosis not present

## 2019-09-04 DIAGNOSIS — S22062A Unstable burst fracture of T7-T8 vertebra, initial encounter for closed fracture: Secondary | ICD-10-CM | POA: Diagnosis not present

## 2019-09-04 DIAGNOSIS — S0990XA Unspecified injury of head, initial encounter: Secondary | ICD-10-CM | POA: Diagnosis not present

## 2019-09-04 DIAGNOSIS — S22072A Unstable burst fracture of T9-T10 vertebra, initial encounter for closed fracture: Secondary | ICD-10-CM | POA: Diagnosis not present

## 2019-09-04 DIAGNOSIS — R0689 Other abnormalities of breathing: Secondary | ICD-10-CM | POA: Diagnosis not present

## 2019-09-04 DIAGNOSIS — I499 Cardiac arrhythmia, unspecified: Secondary | ICD-10-CM | POA: Diagnosis not present

## 2019-09-05 DIAGNOSIS — S22009A Unspecified fracture of unspecified thoracic vertebra, initial encounter for closed fracture: Secondary | ICD-10-CM | POA: Diagnosis not present

## 2019-09-05 DIAGNOSIS — S22069A Unspecified fracture of T7-T8 vertebra, initial encounter for closed fracture: Secondary | ICD-10-CM | POA: Diagnosis not present

## 2019-09-05 DIAGNOSIS — R000 Tachycardia, unspecified: Secondary | ICD-10-CM | POA: Diagnosis not present

## 2019-09-05 DIAGNOSIS — S27329A Contusion of lung, unspecified, initial encounter: Secondary | ICD-10-CM | POA: Diagnosis not present

## 2019-09-07 DIAGNOSIS — S22079A Unspecified fracture of T9-T10 vertebra, initial encounter for closed fracture: Secondary | ICD-10-CM | POA: Diagnosis not present

## 2019-09-07 DIAGNOSIS — E1069 Type 1 diabetes mellitus with other specified complication: Secondary | ICD-10-CM | POA: Diagnosis not present

## 2019-09-07 DIAGNOSIS — E1042 Type 1 diabetes mellitus with diabetic polyneuropathy: Secondary | ICD-10-CM | POA: Diagnosis not present

## 2019-09-07 DIAGNOSIS — S22089A Unspecified fracture of T11-T12 vertebra, initial encounter for closed fracture: Secondary | ICD-10-CM | POA: Diagnosis not present

## 2019-09-07 DIAGNOSIS — S22059A Unspecified fracture of T5-T6 vertebra, initial encounter for closed fracture: Secondary | ICD-10-CM | POA: Diagnosis not present

## 2019-09-07 DIAGNOSIS — E669 Obesity, unspecified: Secondary | ICD-10-CM | POA: Diagnosis not present

## 2019-09-07 DIAGNOSIS — E1022 Type 1 diabetes mellitus with diabetic chronic kidney disease: Secondary | ICD-10-CM | POA: Diagnosis not present

## 2019-09-07 DIAGNOSIS — S22071A Stable burst fracture of T9-T10 vertebra, initial encounter for closed fracture: Secondary | ICD-10-CM | POA: Diagnosis not present

## 2019-09-07 DIAGNOSIS — Z992 Dependence on renal dialysis: Secondary | ICD-10-CM | POA: Diagnosis not present

## 2019-09-07 DIAGNOSIS — D62 Acute posthemorrhagic anemia: Secondary | ICD-10-CM | POA: Diagnosis not present

## 2019-09-07 DIAGNOSIS — N186 End stage renal disease: Secondary | ICD-10-CM | POA: Diagnosis not present

## 2019-09-07 DIAGNOSIS — J984 Other disorders of lung: Secondary | ICD-10-CM | POA: Diagnosis not present

## 2019-09-07 MED ORDER — MELATONIN 3 MG PO TABS
6.00 | ORAL_TABLET | ORAL | Status: DC
Start: 2019-09-11 — End: 2019-09-07

## 2019-09-07 MED ORDER — TRAMADOL HCL 50 MG PO TABS
50.00 | ORAL_TABLET | ORAL | Status: DC
Start: ? — End: 2019-09-07

## 2019-09-07 MED ORDER — SODIUM BICARBONATE 650 MG PO TABS
650.00 | ORAL_TABLET | ORAL | Status: DC
Start: 2019-09-11 — End: 2019-09-07

## 2019-09-07 MED ORDER — HYDROCODONE-ACETAMINOPHEN 5-325 MG PO TABS
1.00 | ORAL_TABLET | ORAL | Status: DC
Start: ? — End: 2019-09-07

## 2019-09-07 MED ORDER — MIRTAZAPINE 15 MG PO TABS
15.00 | ORAL_TABLET | ORAL | Status: DC
Start: 2019-09-11 — End: 2019-09-07

## 2019-09-07 MED ORDER — GABAPENTIN 300 MG PO CAPS
300.00 | ORAL_CAPSULE | ORAL | Status: DC
Start: 2019-09-07 — End: 2019-09-07

## 2019-09-07 MED ORDER — CYCLOBENZAPRINE HCL 10 MG PO TABS
10.00 | ORAL_TABLET | ORAL | Status: DC
Start: ? — End: 2019-09-07

## 2019-09-07 MED ORDER — HEPARIN SOD (PORCINE) IN D5W 100 UNIT/ML IV SOLN
5.00 | INTRAVENOUS | Status: DC
Start: ? — End: 2019-09-07

## 2019-09-07 MED ORDER — DOCUSATE SODIUM 100 MG PO CAPS
100.00 | ORAL_CAPSULE | ORAL | Status: DC
Start: 2019-09-08 — End: 2019-09-07

## 2019-09-07 MED ORDER — INSULIN GLARGINE 100 UNIT/ML SOLOSTAR PEN
10.00 | PEN_INJECTOR | SUBCUTANEOUS | Status: DC
Start: 2019-09-07 — End: 2019-09-07

## 2019-09-07 MED ORDER — GLUCOSE 40 % PO GEL
15.00 | ORAL | Status: DC
Start: ? — End: 2019-09-07

## 2019-09-07 MED ORDER — ONDANSETRON HCL 4 MG/2ML IJ SOLN
4.00 | INTRAMUSCULAR | Status: DC
Start: ? — End: 2019-09-07

## 2019-09-07 MED ORDER — ACETAMINOPHEN 325 MG PO TABS
325.00 | ORAL_TABLET | ORAL | Status: DC
Start: 2019-09-11 — End: 2019-09-07

## 2019-09-07 MED ORDER — PANTOPRAZOLE SODIUM 40 MG PO TBEC
40.00 | DELAYED_RELEASE_TABLET | ORAL | Status: DC
Start: 2019-09-12 — End: 2019-09-07

## 2019-09-07 MED ORDER — FUROSEMIDE 20 MG PO TABS
80.00 | ORAL_TABLET | ORAL | Status: DC
Start: 2019-09-11 — End: 2019-09-07

## 2019-09-07 MED ORDER — INSULIN LISPRO 100 UNIT/ML ~~LOC~~ SOLN
3.00 | SUBCUTANEOUS | Status: DC
Start: 2019-09-07 — End: 2019-09-07

## 2019-09-07 MED ORDER — GENERIC EXTERNAL MEDICATION
12.50 | Status: DC
Start: 2019-09-11 — End: 2019-09-07

## 2019-09-07 MED ORDER — CALCIUM ACETATE (PHOS BINDER) 667 MG PO CAPS
1334.00 | ORAL_CAPSULE | ORAL | Status: DC
Start: 2019-09-07 — End: 2019-09-07

## 2019-09-07 MED ORDER — DEXTROSE 10 % IV SOLN
125.00 | INTRAVENOUS | Status: DC
Start: ? — End: 2019-09-07

## 2019-09-07 MED ORDER — POLYETHYLENE GLYCOL 3350 17 G PO PACK
17.00 | PACK | ORAL | Status: DC
Start: 2019-09-12 — End: 2019-09-07

## 2019-09-07 MED ORDER — INSULIN LISPRO 100 UNIT/ML ~~LOC~~ SOLN
2.00 | SUBCUTANEOUS | Status: DC
Start: 2019-09-11 — End: 2019-09-07

## 2019-09-08 DIAGNOSIS — N186 End stage renal disease: Secondary | ICD-10-CM | POA: Diagnosis not present

## 2019-09-08 DIAGNOSIS — E1069 Type 1 diabetes mellitus with other specified complication: Secondary | ICD-10-CM | POA: Diagnosis not present

## 2019-09-08 DIAGNOSIS — E1051 Type 1 diabetes mellitus with diabetic peripheral angiopathy without gangrene: Secondary | ICD-10-CM | POA: Diagnosis not present

## 2019-09-08 DIAGNOSIS — Z794 Long term (current) use of insulin: Secondary | ICD-10-CM | POA: Diagnosis not present

## 2019-09-08 DIAGNOSIS — I132 Hypertensive heart and chronic kidney disease with heart failure and with stage 5 chronic kidney disease, or end stage renal disease: Secondary | ICD-10-CM | POA: Diagnosis not present

## 2019-09-08 DIAGNOSIS — S22069A Unspecified fracture of T7-T8 vertebra, initial encounter for closed fracture: Secondary | ICD-10-CM | POA: Diagnosis not present

## 2019-09-08 DIAGNOSIS — S22089A Unspecified fracture of T11-T12 vertebra, initial encounter for closed fracture: Secondary | ICD-10-CM | POA: Diagnosis not present

## 2019-09-08 DIAGNOSIS — I2609 Other pulmonary embolism with acute cor pulmonale: Secondary | ICD-10-CM | POA: Diagnosis not present

## 2019-09-08 DIAGNOSIS — Z20828 Contact with and (suspected) exposure to other viral communicable diseases: Secondary | ICD-10-CM | POA: Diagnosis not present

## 2019-09-08 DIAGNOSIS — S22059A Unspecified fracture of T5-T6 vertebra, initial encounter for closed fracture: Secondary | ICD-10-CM | POA: Diagnosis not present

## 2019-09-08 DIAGNOSIS — I509 Heart failure, unspecified: Secondary | ICD-10-CM | POA: Diagnosis not present

## 2019-09-08 DIAGNOSIS — D62 Acute posthemorrhagic anemia: Secondary | ICD-10-CM | POA: Diagnosis not present

## 2019-09-08 DIAGNOSIS — J984 Other disorders of lung: Secondary | ICD-10-CM | POA: Diagnosis not present

## 2019-09-08 DIAGNOSIS — S22079A Unspecified fracture of T9-T10 vertebra, initial encounter for closed fracture: Secondary | ICD-10-CM | POA: Diagnosis not present

## 2019-09-08 DIAGNOSIS — E1140 Type 2 diabetes mellitus with diabetic neuropathy, unspecified: Secondary | ICD-10-CM | POA: Diagnosis not present

## 2019-09-08 DIAGNOSIS — E669 Obesity, unspecified: Secondary | ICD-10-CM | POA: Diagnosis not present

## 2019-09-08 DIAGNOSIS — E1022 Type 1 diabetes mellitus with diabetic chronic kidney disease: Secondary | ICD-10-CM | POA: Diagnosis not present

## 2019-09-08 DIAGNOSIS — Z992 Dependence on renal dialysis: Secondary | ICD-10-CM | POA: Diagnosis not present

## 2019-09-09 DIAGNOSIS — E871 Hypo-osmolality and hyponatremia: Secondary | ICD-10-CM | POA: Diagnosis not present

## 2019-09-09 DIAGNOSIS — S22059A Unspecified fracture of T5-T6 vertebra, initial encounter for closed fracture: Secondary | ICD-10-CM | POA: Diagnosis not present

## 2019-09-09 DIAGNOSIS — E1051 Type 1 diabetes mellitus with diabetic peripheral angiopathy without gangrene: Secondary | ICD-10-CM | POA: Diagnosis not present

## 2019-09-09 DIAGNOSIS — I509 Heart failure, unspecified: Secondary | ICD-10-CM | POA: Diagnosis not present

## 2019-09-09 DIAGNOSIS — Z20828 Contact with and (suspected) exposure to other viral communicable diseases: Secondary | ICD-10-CM | POA: Diagnosis not present

## 2019-09-09 DIAGNOSIS — I2782 Chronic pulmonary embolism: Secondary | ICD-10-CM | POA: Diagnosis not present

## 2019-09-09 DIAGNOSIS — S22079A Unspecified fracture of T9-T10 vertebra, initial encounter for closed fracture: Secondary | ICD-10-CM | POA: Diagnosis not present

## 2019-09-09 DIAGNOSIS — Z7901 Long term (current) use of anticoagulants: Secondary | ICD-10-CM | POA: Diagnosis not present

## 2019-09-09 DIAGNOSIS — I2609 Other pulmonary embolism with acute cor pulmonale: Secondary | ICD-10-CM | POA: Diagnosis not present

## 2019-09-09 DIAGNOSIS — Z992 Dependence on renal dialysis: Secondary | ICD-10-CM | POA: Diagnosis not present

## 2019-09-09 DIAGNOSIS — E1065 Type 1 diabetes mellitus with hyperglycemia: Secondary | ICD-10-CM | POA: Diagnosis not present

## 2019-09-09 DIAGNOSIS — I132 Hypertensive heart and chronic kidney disease with heart failure and with stage 5 chronic kidney disease, or end stage renal disease: Secondary | ICD-10-CM | POA: Diagnosis not present

## 2019-09-09 DIAGNOSIS — E1042 Type 1 diabetes mellitus with diabetic polyneuropathy: Secondary | ICD-10-CM | POA: Diagnosis not present

## 2019-09-09 DIAGNOSIS — N186 End stage renal disease: Secondary | ICD-10-CM | POA: Diagnosis not present

## 2019-09-09 DIAGNOSIS — D62 Acute posthemorrhagic anemia: Secondary | ICD-10-CM | POA: Diagnosis not present

## 2019-09-09 DIAGNOSIS — S22089A Unspecified fracture of T11-T12 vertebra, initial encounter for closed fracture: Secondary | ICD-10-CM | POA: Diagnosis not present

## 2019-09-09 DIAGNOSIS — E1022 Type 1 diabetes mellitus with diabetic chronic kidney disease: Secondary | ICD-10-CM | POA: Diagnosis not present

## 2019-09-09 DIAGNOSIS — S27329A Contusion of lung, unspecified, initial encounter: Secondary | ICD-10-CM | POA: Diagnosis not present

## 2019-09-09 DIAGNOSIS — J984 Other disorders of lung: Secondary | ICD-10-CM | POA: Diagnosis not present

## 2019-09-10 DIAGNOSIS — D62 Acute posthemorrhagic anemia: Secondary | ICD-10-CM | POA: Diagnosis not present

## 2019-09-10 DIAGNOSIS — S22079A Unspecified fracture of T9-T10 vertebra, initial encounter for closed fracture: Secondary | ICD-10-CM | POA: Diagnosis not present

## 2019-09-10 DIAGNOSIS — Z20828 Contact with and (suspected) exposure to other viral communicable diseases: Secondary | ICD-10-CM | POA: Diagnosis not present

## 2019-09-10 DIAGNOSIS — S22059A Unspecified fracture of T5-T6 vertebra, initial encounter for closed fracture: Secondary | ICD-10-CM | POA: Diagnosis not present

## 2019-09-10 DIAGNOSIS — Z794 Long term (current) use of insulin: Secondary | ICD-10-CM | POA: Diagnosis not present

## 2019-09-10 DIAGNOSIS — N186 End stage renal disease: Secondary | ICD-10-CM | POA: Diagnosis not present

## 2019-09-10 DIAGNOSIS — I2782 Chronic pulmonary embolism: Secondary | ICD-10-CM | POA: Diagnosis not present

## 2019-09-10 DIAGNOSIS — E1065 Type 1 diabetes mellitus with hyperglycemia: Secondary | ICD-10-CM | POA: Diagnosis not present

## 2019-09-10 DIAGNOSIS — E1051 Type 1 diabetes mellitus with diabetic peripheral angiopathy without gangrene: Secondary | ICD-10-CM | POA: Diagnosis not present

## 2019-09-10 DIAGNOSIS — E871 Hypo-osmolality and hyponatremia: Secondary | ICD-10-CM | POA: Diagnosis not present

## 2019-09-10 DIAGNOSIS — J984 Other disorders of lung: Secondary | ICD-10-CM | POA: Diagnosis not present

## 2019-09-10 DIAGNOSIS — I120 Hypertensive chronic kidney disease with stage 5 chronic kidney disease or end stage renal disease: Secondary | ICD-10-CM | POA: Diagnosis not present

## 2019-09-10 DIAGNOSIS — E1040 Type 1 diabetes mellitus with diabetic neuropathy, unspecified: Secondary | ICD-10-CM | POA: Diagnosis not present

## 2019-09-10 DIAGNOSIS — Z992 Dependence on renal dialysis: Secondary | ICD-10-CM | POA: Diagnosis not present

## 2019-09-10 DIAGNOSIS — E1022 Type 1 diabetes mellitus with diabetic chronic kidney disease: Secondary | ICD-10-CM | POA: Diagnosis not present

## 2019-09-10 DIAGNOSIS — S27329A Contusion of lung, unspecified, initial encounter: Secondary | ICD-10-CM | POA: Diagnosis not present

## 2019-09-10 DIAGNOSIS — I2609 Other pulmonary embolism with acute cor pulmonale: Secondary | ICD-10-CM | POA: Diagnosis not present

## 2019-09-10 DIAGNOSIS — S22089A Unspecified fracture of T11-T12 vertebra, initial encounter for closed fracture: Secondary | ICD-10-CM | POA: Diagnosis not present

## 2019-09-10 DIAGNOSIS — Z9115 Patient's noncompliance with renal dialysis: Secondary | ICD-10-CM | POA: Diagnosis not present

## 2019-09-11 ENCOUNTER — Other Ambulatory Visit: Payer: Self-pay | Admitting: *Deleted

## 2019-09-11 DIAGNOSIS — E1065 Type 1 diabetes mellitus with hyperglycemia: Secondary | ICD-10-CM | POA: Diagnosis not present

## 2019-09-11 DIAGNOSIS — Z992 Dependence on renal dialysis: Secondary | ICD-10-CM | POA: Diagnosis not present

## 2019-09-11 DIAGNOSIS — N186 End stage renal disease: Secondary | ICD-10-CM | POA: Diagnosis not present

## 2019-09-11 DIAGNOSIS — E1022 Type 1 diabetes mellitus with diabetic chronic kidney disease: Secondary | ICD-10-CM | POA: Diagnosis not present

## 2019-09-11 DIAGNOSIS — E1040 Type 1 diabetes mellitus with diabetic neuropathy, unspecified: Secondary | ICD-10-CM | POA: Diagnosis not present

## 2019-09-11 MED ORDER — DAILY VITAMINS/IRON PO
5.00 | ORAL | Status: DC
Start: ? — End: 2019-09-11

## 2019-09-11 MED ORDER — Medication
1.00 | Status: DC
Start: 2019-09-11 — End: 2019-09-11

## 2019-09-11 MED ORDER — CESIUM CHLORIDE (TRACE MINERALS)
6.00 | Status: DC
Start: 2019-09-11 — End: 2019-09-11

## 2019-09-11 MED ORDER — Medication
667.00 | Status: DC
Start: 2019-09-11 — End: 2019-09-11

## 2019-09-11 MED ORDER — PREPARATION H TOTABLES 1-0.25-14.4-15 % RE CREA
2.00 | TOPICAL_CREAM | RECTAL | Status: DC
Start: 2019-09-12 — End: 2019-09-11

## 2019-09-11 MED ORDER — TUMS LASTING EFFECTS PO
30.00 | ORAL | Status: DC
Start: ? — End: 2019-09-11

## 2019-09-11 MED ORDER — EMBRACE PUMP SET-PIERCING PIN MISC
1.00 | Status: DC
Start: ? — End: 2019-09-11

## 2019-09-11 MED ORDER — PCCA BIOPEPTIDE BASE EX CREA
300.00 | TOPICAL_CREAM | CUTANEOUS | Status: DC
Start: 2019-09-11 — End: 2019-09-11

## 2019-09-11 MED ORDER — AMERIFED DM PO
5.00 | ORAL | Status: DC
Start: 2019-09-11 — End: 2019-09-11

## 2019-09-11 MED ORDER — DICLOXACILLIN SODIUM 62.5 MG/5ML PO SUSR
10.00 | ORAL | Status: DC
Start: ? — End: 2019-09-11
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
## 2019-09-12 DIAGNOSIS — Z992 Dependence on renal dialysis: Secondary | ICD-10-CM | POA: Diagnosis not present

## 2019-09-12 DIAGNOSIS — N186 End stage renal disease: Secondary | ICD-10-CM | POA: Diagnosis not present

## 2019-09-13 DIAGNOSIS — S22079A Unspecified fracture of T9-T10 vertebra, initial encounter for closed fracture: Secondary | ICD-10-CM | POA: Diagnosis not present

## 2019-09-13 DIAGNOSIS — E8770 Fluid overload, unspecified: Secondary | ICD-10-CM | POA: Diagnosis not present

## 2019-09-13 DIAGNOSIS — E1022 Type 1 diabetes mellitus with diabetic chronic kidney disease: Secondary | ICD-10-CM | POA: Diagnosis not present

## 2019-09-13 DIAGNOSIS — Z992 Dependence on renal dialysis: Secondary | ICD-10-CM | POA: Diagnosis not present

## 2019-09-13 DIAGNOSIS — S22089A Unspecified fracture of T11-T12 vertebra, initial encounter for closed fracture: Secondary | ICD-10-CM | POA: Diagnosis not present

## 2019-09-13 DIAGNOSIS — N186 End stage renal disease: Secondary | ICD-10-CM | POA: Diagnosis not present

## 2019-09-13 DIAGNOSIS — I509 Heart failure, unspecified: Secondary | ICD-10-CM | POA: Diagnosis not present

## 2019-09-13 DIAGNOSIS — I132 Hypertensive heart and chronic kidney disease with heart failure and with stage 5 chronic kidney disease, or end stage renal disease: Secondary | ICD-10-CM | POA: Diagnosis not present

## 2019-09-13 DIAGNOSIS — Z794 Long term (current) use of insulin: Secondary | ICD-10-CM | POA: Diagnosis not present

## 2019-09-17 DIAGNOSIS — M6281 Muscle weakness (generalized): Secondary | ICD-10-CM | POA: Diagnosis not present

## 2019-09-17 DIAGNOSIS — S27329D Contusion of lung, unspecified, subsequent encounter: Secondary | ICD-10-CM | POA: Diagnosis not present

## 2019-09-17 DIAGNOSIS — S2249XD Multiple fractures of ribs, unspecified side, subsequent encounter for fracture with routine healing: Secondary | ICD-10-CM | POA: Diagnosis not present

## 2019-09-17 DIAGNOSIS — I2699 Other pulmonary embolism without acute cor pulmonale: Secondary | ICD-10-CM | POA: Diagnosis not present

## 2019-09-17 DIAGNOSIS — S22089D Unspecified fracture of T11-T12 vertebra, subsequent encounter for fracture with routine healing: Secondary | ICD-10-CM | POA: Diagnosis not present

## 2019-09-17 DIAGNOSIS — J441 Chronic obstructive pulmonary disease with (acute) exacerbation: Secondary | ICD-10-CM | POA: Diagnosis not present

## 2019-09-17 DIAGNOSIS — I509 Heart failure, unspecified: Secondary | ICD-10-CM | POA: Diagnosis not present

## 2019-09-17 DIAGNOSIS — S22058D Other fracture of T5-T6 vertebra, subsequent encounter for fracture with routine healing: Secondary | ICD-10-CM | POA: Diagnosis not present

## 2019-09-17 DIAGNOSIS — E1022 Type 1 diabetes mellitus with diabetic chronic kidney disease: Secondary | ICD-10-CM | POA: Diagnosis not present

## 2019-09-17 DIAGNOSIS — S22070D Wedge compression fracture of T9-T10 vertebra, subsequent encounter for fracture with routine healing: Secondary | ICD-10-CM | POA: Diagnosis not present

## 2019-09-17 DIAGNOSIS — S36420D Contusion of duodenum, subsequent encounter: Secondary | ICD-10-CM | POA: Diagnosis not present

## 2019-09-18 DIAGNOSIS — Z992 Dependence on renal dialysis: Secondary | ICD-10-CM | POA: Diagnosis not present

## 2019-09-18 DIAGNOSIS — N186 End stage renal disease: Secondary | ICD-10-CM | POA: Diagnosis not present

## 2019-09-21 DIAGNOSIS — M549 Dorsalgia, unspecified: Secondary | ICD-10-CM | POA: Diagnosis not present

## 2019-09-21 DIAGNOSIS — R739 Hyperglycemia, unspecified: Secondary | ICD-10-CM | POA: Diagnosis not present

## 2019-09-21 DIAGNOSIS — Z1159 Encounter for screening for other viral diseases: Secondary | ICD-10-CM | POA: Diagnosis not present

## 2019-09-21 DIAGNOSIS — N186 End stage renal disease: Secondary | ICD-10-CM | POA: Diagnosis not present

## 2019-09-21 DIAGNOSIS — Z992 Dependence on renal dialysis: Secondary | ICD-10-CM | POA: Diagnosis not present

## 2019-09-21 DIAGNOSIS — Z049 Encounter for examination and observation for unspecified reason: Secondary | ICD-10-CM | POA: Diagnosis not present

## 2019-09-22 ENCOUNTER — Encounter: Payer: Self-pay | Admitting: Gastroenterology

## 2019-09-22 ENCOUNTER — Telehealth: Payer: Self-pay | Admitting: Gastroenterology

## 2019-09-22 ENCOUNTER — Ambulatory Visit: Payer: Medicare Other | Admitting: Nurse Practitioner

## 2019-09-22 DIAGNOSIS — I2699 Other pulmonary embolism without acute cor pulmonale: Secondary | ICD-10-CM | POA: Diagnosis not present

## 2019-09-22 DIAGNOSIS — S2249XD Multiple fractures of ribs, unspecified side, subsequent encounter for fracture with routine healing: Secondary | ICD-10-CM | POA: Diagnosis not present

## 2019-09-22 DIAGNOSIS — S22058D Other fracture of T5-T6 vertebra, subsequent encounter for fracture with routine healing: Secondary | ICD-10-CM | POA: Diagnosis not present

## 2019-09-22 DIAGNOSIS — M6281 Muscle weakness (generalized): Secondary | ICD-10-CM | POA: Diagnosis not present

## 2019-09-22 DIAGNOSIS — R52 Pain, unspecified: Secondary | ICD-10-CM | POA: Diagnosis not present

## 2019-09-22 DIAGNOSIS — I509 Heart failure, unspecified: Secondary | ICD-10-CM | POA: Diagnosis not present

## 2019-09-22 DIAGNOSIS — S22070D Wedge compression fracture of T9-T10 vertebra, subsequent encounter for fracture with routine healing: Secondary | ICD-10-CM | POA: Diagnosis not present

## 2019-09-22 DIAGNOSIS — S22089D Unspecified fracture of T11-T12 vertebra, subsequent encounter for fracture with routine healing: Secondary | ICD-10-CM | POA: Diagnosis not present

## 2019-09-22 DIAGNOSIS — S27329D Contusion of lung, unspecified, subsequent encounter: Secondary | ICD-10-CM | POA: Diagnosis not present

## 2019-09-22 DIAGNOSIS — M62838 Other muscle spasm: Secondary | ICD-10-CM | POA: Diagnosis not present

## 2019-09-22 NOTE — Progress Notes (Deleted)
Primary Care Physician:  Maryruth Hancock, MD Primary Gastroenterologist:  Dr.   Rayne Du chief complaint on file.   HPI:   Steven [name] is a 58 y.o. male who presents on referral from primary care for diarrhea.  Reviewed information associated with referral including primary care office visit dated 08/26/2019 for end-stage renal disease, diarrhea, alcohol abuse, and others.  Noted history of diabetes using basal insulin and sliding scale, not checking CBGs regularly or keeping dialysis appointments.  Accompanied to the visit by social worker, was previously in a nursing facility but now living at home on his own.  Has missed dialysis for several weeks.  Admits to drinking several times a week approximately 48 ounces of beer.  Had an episode of memory loss when he was found at the [place] by his sister.  Does not eat regular meals, does not cook so he eats out often.  Due to ongoing diarrhea he was referred to GI with ongoing concerns of alcoholism and not maintaining dialysis, questionable diet.  He was also subsequently referred to diabetic education and nephrology for end-stage renal disease.  Noted ongoing memory loss and not keeping dialysis appointments with continued alcohol use.  Recommended complete abstinence.  No history of colonoscopy or endoscopy in our system.  Today he states   Past Medical History:  Diagnosis Date  . 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 Alcohol abuse   . Anemia   . Asthma   . Cellulitis and abscess of right lower extremity 03/01/2017  . CHF (congestive heart failure) ([place])   . Chronic kidney disease    dialysis m,w,f  . Cognitive communication deficit   . COPD (chronic obstructive pulmonary disease) ([place])   . Coronary artery disease   . Diabetes mellitus without complication ([place])   . Edema extremities 03/01/2017   bilateral swelling  . GERD (gastroesophageal reflux disease)   . High cholesterol   . High triglycerides   .  Hyperlipemia   . Hypertension   . Hypothyroidism   . Major depressive disorder   . Muscle weakness   . Neuropathy   . OSA (obstructive sleep apnea)   . Other abnormalities of gait and mobility   . Peripheral vascular disease ([place])   . Pneumonia   . Sleep apnea    can't use cpap d/t feelings of suffocation    Past Surgical History:  Procedure Laterality Date  . A/V FISTULAGRAM Right 08/27/2017   Procedure: A/V FISTULAGRAM;  Surgeon: Katha Cabal, MD;  Location: [facility];  Service: Cardiovascular;  Laterality: Right;  . A/V FISTULAGRAM Right 11/04/2018   Procedure: A/V FISTULAGRAM;  Surgeon: Katha Cabal, MD;  Location: [facility];  Service: Cardiovascular;  Laterality: Right;  . A/V SHUNT INTERVENTION N/A 06/23/2018   Procedure: A/V SHUNT INTERVENTION;  Surgeon: Algernon Huxley, MD;  Location: [facility];  Service: Cardiovascular;  Laterality: N/A;  . APPENDECTOMY  2010  . AV FISTULA PLACEMENT Right 06/14/2017   Procedure: ARTERIOVENOUS (AV) FISTULA CREATION WRIST;  Surgeon: Katha Cabal, MD;  Location: [facility];  Service: Vascular;  Laterality: Right;  . DIALYSIS/PERMA CATHETER INSERTION N/A 03/05/2017   Procedure: Dialysis/Perma Catheter Insertion;  Surgeon: Katha Cabal, MD;  Location: [place] [facility] [facility];  Service: Cardiovascular;  Laterality: N/A;  . DIALYSIS/PERMA CATHETER INSERTION N/A 09/20/2017   Procedure: DIALYSIS/PERMA CATHETER INSERTION;  Surgeon: Katha Cabal, MD;  Location: [facility];  Service: Cardiovascular;  Laterality: N/A;  . DIALYSIS/PERMA CATHETER INSERTION N/A 01/26/2019   Procedure: DIALYSIS/PERMA CATHETER INSERTION/Exchange;  Surgeon: Algernon Huxley, MD;  Location:  [facility];  Service: Cardiovascular;  Laterality: N/A;  . DIALYSIS/PERMA CATHETER INSERTION N/A 01/27/2019   Procedure: DIALYSIS/PERMA CATHETER INSERTION;  Surgeon: Katha Cabal, MD;  Location: [facility];   Service: Cardiovascular;  Laterality: N/A;  . EXCHANGE OF A DIALYSIS CATHETER  03/29/2017   Procedure: Exchange Of A Dialysis Catheter;  Surgeon: Katha Cabal, MD;  Location: [facility];  Service: Cardiovascular;;  . IRRIGATION AND DEBRIDEMENT FOOT Right 03/01/2017   Procedure: IRRIGATION AND DEBRIDEMENT FOOT;  Surgeon: Albertine Patricia, DPM;  Location: [facility];  Service: Podiatry;  Laterality: Right;  application of wound vac  . ORIF ANKLE FRACTURE Right 06/19/2019   Procedure: OPEN REDUCTION INTERNAL FIXATION (ORIF) ANKLE FRACTURE;  Surgeon: Altamese Arial, MD;  Location: [place];  Service: Orthopedics;  Laterality: Right;  . PERIPHERAL VASCULAR THROMBECTOMY Right 06/18/2018   Procedure: PERIPHERAL VASCULAR THROMBECTOMY;  Surgeon: Algernon Huxley, MD;  Location: [facility];  Service: Cardiovascular;  Laterality: Right;  . REPAIR NON-UNION ULNA Right 06/19/2019   Procedure: Repair Non-Union right tibia;  Surgeon: Altamese Mayfield, MD;  Location: [facility];  Service: Orthopedics;  Laterality: Right;  Marland Kitchen VASCULAR ACCESS DEVICE INSERTION Right 01/30/2019   Procedure: INSERTION OF HERO VASCULAR ACCESS DEVICE;  Surgeon: Katha Cabal, MD;  Location: [facility];  Service: Vascular;  Laterality: Right;    Current Outpatient Medications  Medication Sig Dispense Refill  . acetaminophen (TYLENOL) 325 MG tablet Take 2 tablets (650 mg total) by mouth every 12 (twelve) hours. (Patient not taking: Reported on 08/08/2019) 60 tablet 0  . calcium acetate (PHOSLO) 667 MG capsule Take 2 capsules (1,334 mg total) by mouth 3 (three) times daily with meals. 180 capsule 0  . furosemide (LASIX) 80 MG tablet Take 1 tablet (80 mg total) by mouth daily. 30 tablet 0  . gabapentin (NEURONTIN) 300 MG capsule Take 1 capsule (300 mg total) by mouth at bedtime. 30 capsule 0  . insulin aspart (NOVOLOG FLEXPEN) 100 UNIT/ML FlexPen Inject 6-16 Units into the skin 3 (three) times daily with meals. Per Sliding Scale; If  Blood Sugar is less than 60, call MD. If Blood Sugar is 150 to 200, give 2 Units. If Blood Sugar is 201 to 250, give 4 Units. If Blood Sugar is 251 to 300, give 6 Units. If Blood Sugar is 301 to 350, give 8 Units. If Blood Sugar is 351 to 400, give 10 Units. If Blood Sugar is greater than 400, call MD. subcutaneous With Meals GIVE 6 UNITS WITH MEALS IN ADDITION TO SLIDING SCALE 15 mL 0  . Insulin Detemir (LEVEMIR FLEXTOUCH) 100 UNIT/ML Pen Inject 14 Units into the skin at bedtime. 15 mL 0  . metoprolol tartrate (LOPRESSOR) 25 MG tablet Take 0.5 tablets (12.5 mg total) by mouth 2 (two) times daily. 30 tablet 0  . midodrine (PROAMATINE) 10 MG tablet Take 1 tablet (10 mg total) by mouth 2 (two) times daily with a meal. 60 tablet 1  . mirtazapine (REMERON) 15 MG tablet Take 1 tablet (15 mg total) by mouth at bedtime. 30 tablet 0  . mometasone-formoterol (DULERA) 100-5 MCG/ACT AERO Inhale 2 puffs into the lungs every morning. 8.8 g 0  . NON FORMULARY Diet: Regular, NAS, Consistent Carbohydrate    . NON FORMULARY Fluid restriction: 1200cc a day  Special Instructions: Special Instructions: 500cc for days, Evening 500cc and night 200cc Every Shift Day, Evening, Night    . omeprazole (PRILOSEC OTC) 20 MG tablet Take 1 tablet (20 mg total) by mouth daily. 30 tablet 1  . ondansetron (ZOFRAN) 4 MG tablet Take 1 tablet (4 mg total) by mouth every 6 (six) hours as needed for nausea. 20 tablet 0  . PROAIR HFA 108 (90 Base) MCG/ACT inhaler Inhale 2 puffs into the lungs every 4 (four) hours as needed for wheezing or shortness of breath. 6.7 g 0  . sodium bicarbonate 650 MG tablet Take 1 tablet (650 mg total) by mouth 2 (two) times daily. 60 tablet 0  . warfarin (COUMADIN) 5 MG tablet Take 1.5 tablets (7.5 mg total) by mouth daily at 6 PM. Special Instructions: Take 1.5 tabs (7.5 mg daily except Wednesday) Use appropriate precautions for receiving, handling, administration, and disposal. Gloves (single) should be worn  during receiving, unpacking, and placing in storage. NIOSH recommends single gloving for administration of intact tablets or capsules. Once A Day on Sun, Mon, Tue, Thu, Fri, Sat 45 tablet 0   No current facility-administered medications for this visit.    Allergies as of 09/22/2019 - Review Complete 08/26/2019  Allergen Reaction Noted  . Codeine Nausea And Vomiting     Family History  Problem Relation Age of Onset  . CAD Father   . Heart disease Father     Social History   Socioeconomic History  . Marital status: Divorced    Spouse name: Not on file  . Number of children: 1  . Years of education: Not on file  . Highest education level: Not on file  Occupational History  . Occupation: disability  Tobacco Use  . Smoking status: Former Research scientist (life sciences)  . Smokeless tobacco: Current User    Types: Snuff  Substance and Sexual Activity  . Alcohol use: Yes    Comment: up until 01/2017 was heavy drinker...1-2 beers a couple times a week now  . Drug use: Never  . Sexual activity: Never  Other Topics Concern  . Not on file  Social History Narrative   Lives with his 60 year old son. Has a caregiver that helps with some meals and housekeeping.   Social Determinants of Health   Financial Resource Strain: Low Risk   . Difficulty of Paying Living Expenses: Not very hard  Food Insecurity: No Food Insecurity  . Worried About Charity fundraiser in the Last Year: Never true  . Ran Out of Food in the Last Year: Never true  Transportation Needs: No Transportation Needs  . Lack of Transportation (Medical): No  . Lack of Transportation (Non-Medical): No  Physical Activity: Inactive  . Days of Exercise per Week: 0 days  . Minutes of Exercise per Session: 0 min  Stress: No Stress Concern Present  . Feeling of Stress : Not at all  Social Connections: Moderately Isolated  . Frequency of Communication with Friends and Family: More than three times a week  . Frequency of Social Gatherings with  Friends and Family: More than three times a week  . Attends Religious Services: Never  . Active Member of Clubs or Organizations: No  . Attends Archivist Meetings: Never  . Marital Status: Divorced  Human resources officer Violence: Not At Risk  . Fear of Current or Ex-Partner: No  . Emotionally Abused: No  . Physically Abused: No  . Sexually Abused: No    Review of Systems: General:  Negative for anorexia, weight loss, fever, chills, fatigue, weakness. Eyes: Negative for vision changes.  ENT: Negative for hoarseness, difficulty swallowing , nasal congestion. CV: Negative for chest pain, angina, palpitations, dyspnea on exertion, peripheral edema.  Respiratory: Negative for dyspnea at rest, dyspnea on exertion, cough, sputum, wheezing.  GI: See history of present illness. GU:  Negative for dysuria, hematuria, urinary incontinence, urinary frequency, nocturnal urination.  MS: Negative for joint pain, low back pain.  Derm: Negative for rash or itching.  Neuro: Negative for weakness, abnormal sensation, seizure, frequent headaches, memory loss, confusion.  Psych: Negative for anxiety, depression, suicidal ideation, hallucinations.  Endo: Negative for unusual weight change.  Heme: Negative for bruising or bleeding. Allergy: Negative for rash or hives.    Physical Exam: There were no vitals taken for this visit. General:   Alert and oriented. Pleasant and cooperative. Well-nourished and well-developed.  Head:  Normocephalic and atraumatic. Eyes:  Without icterus, sclera clear and conjunctiva pink.  Ears:  Normal auditory acuity. Mouth:  No deformity or lesions, oral mucosa pink.  Throat/Neck:  Supple, without mass or thyromegaly. Cardiovascular:  S1, S2 present without murmurs appreciated. Normal pulses noted. Extremities without clubbing or edema. Respiratory:  Clear to auscultation bilaterally. No wheezes, rales, or rhonchi. No distress.  Gastrointestinal:  +BS, soft,  non-tender and non-distended. No HSM noted. No guarding or rebound. No masses appreciated.  Rectal:  Deferred  Musculoskalatal:  Symmetrical without gross deformities. Normal posture. Skin:  Intact without significant lesions or rashes. Neurologic:  Alert and oriented x4;  grossly normal neurologically. Psych:  Alert and cooperative. Normal mood and affect. Heme/Lymph/Immune: No significant cervical adenopathy. No excessive bruising noted.    09/22/2019 1:07 PM   Disclaimer: This note was dictated with voice recognition software. Similar sounding words can inadvertently be transcribed and may not be corrected upon review.

## 2019-09-22 NOTE — Telephone Encounter (Signed)
PATIENT WAS A NO SHOW AND LETTER SENT  °

## 2019-09-23 DIAGNOSIS — Z1159 Encounter for screening for other viral diseases: Secondary | ICD-10-CM | POA: Diagnosis not present

## 2019-09-25 DIAGNOSIS — E43 Unspecified severe protein-calorie malnutrition: Secondary | ICD-10-CM | POA: Diagnosis not present

## 2019-09-25 DIAGNOSIS — K7200 Acute and subacute hepatic failure without coma: Secondary | ICD-10-CM | POA: Diagnosis not present

## 2019-09-25 DIAGNOSIS — R509 Fever, unspecified: Secondary | ICD-10-CM | POA: Diagnosis not present

## 2019-09-25 DIAGNOSIS — Z992 Dependence on renal dialysis: Secondary | ICD-10-CM | POA: Diagnosis not present

## 2019-09-25 DIAGNOSIS — N186 End stage renal disease: Secondary | ICD-10-CM | POA: Diagnosis not present

## 2019-09-25 DIAGNOSIS — T82868A Thrombosis of vascular prosthetic devices, implants and grafts, initial encounter: Secondary | ICD-10-CM | POA: Diagnosis not present

## 2019-09-25 DIAGNOSIS — R1032 Left lower quadrant pain: Secondary | ICD-10-CM | POA: Diagnosis not present

## 2019-09-25 DIAGNOSIS — R570 Cardiogenic shock: Secondary | ICD-10-CM | POA: Diagnosis not present

## 2019-09-25 DIAGNOSIS — I2102 ST elevation (STEMI) myocardial infarction involving left anterior descending coronary artery: Secondary | ICD-10-CM | POA: Diagnosis not present

## 2019-09-27 DIAGNOSIS — R739 Hyperglycemia, unspecified: Secondary | ICD-10-CM | POA: Diagnosis not present

## 2019-09-27 DIAGNOSIS — I240 Acute coronary thrombosis not resulting in myocardial infarction: Secondary | ICD-10-CM | POA: Diagnosis not present

## 2019-09-27 DIAGNOSIS — N186 End stage renal disease: Secondary | ICD-10-CM | POA: Diagnosis present

## 2019-09-27 DIAGNOSIS — M6281 Muscle weakness (generalized): Secondary | ICD-10-CM | POA: Diagnosis not present

## 2019-09-27 DIAGNOSIS — I70228 Atherosclerosis of native arteries of extremities with rest pain, other extremity: Secondary | ICD-10-CM | POA: Diagnosis not present

## 2019-09-27 DIAGNOSIS — I513 Intracardiac thrombosis, not elsewhere classified: Secondary | ICD-10-CM | POA: Diagnosis not present

## 2019-09-27 DIAGNOSIS — Z794 Long term (current) use of insulin: Secondary | ICD-10-CM | POA: Diagnosis not present

## 2019-09-27 DIAGNOSIS — R7881 Bacteremia: Secondary | ICD-10-CM | POA: Diagnosis present

## 2019-09-27 DIAGNOSIS — R1311 Dysphagia, oral phase: Secondary | ICD-10-CM | POA: Diagnosis not present

## 2019-09-27 DIAGNOSIS — I70208 Unspecified atherosclerosis of native arteries of extremities, other extremity: Secondary | ICD-10-CM | POA: Diagnosis not present

## 2019-09-27 DIAGNOSIS — E875 Hyperkalemia: Secondary | ICD-10-CM | POA: Diagnosis present

## 2019-09-27 DIAGNOSIS — E872 Acidosis: Secondary | ICD-10-CM | POA: Diagnosis present

## 2019-09-27 DIAGNOSIS — S2242XA Multiple fractures of ribs, left side, initial encounter for closed fracture: Secondary | ICD-10-CM | POA: Diagnosis present

## 2019-09-27 DIAGNOSIS — Z20822 Contact with and (suspected) exposure to covid-19: Secondary | ICD-10-CM | POA: Diagnosis present

## 2019-09-27 DIAGNOSIS — I742 Embolism and thrombosis of arteries of the upper extremities: Secondary | ICD-10-CM | POA: Diagnosis not present

## 2019-09-27 DIAGNOSIS — R29898 Other symptoms and signs involving the musculoskeletal system: Secondary | ICD-10-CM | POA: Diagnosis not present

## 2019-09-27 DIAGNOSIS — I120 Hypertensive chronic kidney disease with stage 5 chronic kidney disease or end stage renal disease: Secondary | ICD-10-CM | POA: Diagnosis not present

## 2019-09-27 DIAGNOSIS — Z9889 Other specified postprocedural states: Secondary | ICD-10-CM | POA: Diagnosis not present

## 2019-09-27 DIAGNOSIS — I998 Other disorder of circulatory system: Secondary | ICD-10-CM | POA: Diagnosis not present

## 2019-09-27 DIAGNOSIS — I480 Paroxysmal atrial fibrillation: Secondary | ICD-10-CM | POA: Diagnosis not present

## 2019-09-27 DIAGNOSIS — B9689 Other specified bacterial agents as the cause of diseases classified elsewhere: Secondary | ICD-10-CM | POA: Diagnosis not present

## 2019-09-27 DIAGNOSIS — I132 Hypertensive heart and chronic kidney disease with heart failure and with stage 5 chronic kidney disease, or end stage renal disease: Secondary | ICD-10-CM | POA: Diagnosis not present

## 2019-09-27 DIAGNOSIS — M79642 Pain in left hand: Secondary | ICD-10-CM | POA: Diagnosis not present

## 2019-09-27 DIAGNOSIS — N170 Acute kidney failure with tubular necrosis: Secondary | ICD-10-CM | POA: Diagnosis not present

## 2019-09-27 DIAGNOSIS — R935 Abnormal findings on diagnostic imaging of other abdominal regions, including retroperitoneum: Secondary | ICD-10-CM | POA: Diagnosis not present

## 2019-09-27 DIAGNOSIS — Z049 Encounter for examination and observation for unspecified reason: Secondary | ICD-10-CM | POA: Diagnosis not present

## 2019-09-27 DIAGNOSIS — E878 Other disorders of electrolyte and fluid balance, not elsewhere classified: Secondary | ICD-10-CM | POA: Diagnosis not present

## 2019-09-27 DIAGNOSIS — I319 Disease of pericardium, unspecified: Secondary | ICD-10-CM | POA: Diagnosis not present

## 2019-09-27 DIAGNOSIS — J9811 Atelectasis: Secondary | ICD-10-CM | POA: Diagnosis not present

## 2019-09-27 DIAGNOSIS — D649 Anemia, unspecified: Secondary | ICD-10-CM | POA: Diagnosis not present

## 2019-09-27 DIAGNOSIS — I491 Atrial premature depolarization: Secondary | ICD-10-CM | POA: Diagnosis not present

## 2019-09-27 DIAGNOSIS — E1022 Type 1 diabetes mellitus with diabetic chronic kidney disease: Secondary | ICD-10-CM | POA: Diagnosis not present

## 2019-09-27 DIAGNOSIS — R918 Other nonspecific abnormal finding of lung field: Secondary | ICD-10-CM | POA: Diagnosis not present

## 2019-09-27 DIAGNOSIS — I214 Non-ST elevation (NSTEMI) myocardial infarction: Secondary | ICD-10-CM | POA: Diagnosis not present

## 2019-09-27 DIAGNOSIS — E1051 Type 1 diabetes mellitus with diabetic peripheral angiopathy without gangrene: Secondary | ICD-10-CM | POA: Diagnosis not present

## 2019-09-27 DIAGNOSIS — E43 Unspecified severe protein-calorie malnutrition: Secondary | ICD-10-CM | POA: Diagnosis present

## 2019-09-27 DIAGNOSIS — K7200 Acute and subacute hepatic failure without coma: Secondary | ICD-10-CM | POA: Diagnosis not present

## 2019-09-27 DIAGNOSIS — Z86718 Personal history of other venous thrombosis and embolism: Secondary | ICD-10-CM | POA: Diagnosis not present

## 2019-09-27 DIAGNOSIS — I222 Subsequent non-ST elevation (NSTEMI) myocardial infarction: Secondary | ICD-10-CM | POA: Diagnosis not present

## 2019-09-27 DIAGNOSIS — E785 Hyperlipidemia, unspecified: Secondary | ICD-10-CM | POA: Diagnosis not present

## 2019-09-27 DIAGNOSIS — I4891 Unspecified atrial fibrillation: Secondary | ICD-10-CM | POA: Diagnosis not present

## 2019-09-27 DIAGNOSIS — Z992 Dependence on renal dialysis: Secondary | ICD-10-CM | POA: Diagnosis not present

## 2019-09-27 DIAGNOSIS — I9589 Other hypotension: Secondary | ICD-10-CM | POA: Diagnosis not present

## 2019-09-27 DIAGNOSIS — D62 Acute posthemorrhagic anemia: Secondary | ICD-10-CM | POA: Diagnosis not present

## 2019-09-27 DIAGNOSIS — I959 Hypotension, unspecified: Secondary | ICD-10-CM | POA: Diagnosis not present

## 2019-09-27 DIAGNOSIS — Z736 Limitation of activities due to disability: Secondary | ICD-10-CM | POA: Diagnosis not present

## 2019-09-27 DIAGNOSIS — Z20828 Contact with and (suspected) exposure to other viral communicable diseases: Secondary | ICD-10-CM | POA: Diagnosis not present

## 2019-09-27 DIAGNOSIS — I771 Stricture of artery: Secondary | ICD-10-CM | POA: Diagnosis not present

## 2019-09-27 DIAGNOSIS — S82891A Other fracture of right lower leg, initial encounter for closed fracture: Secondary | ICD-10-CM | POA: Diagnosis present

## 2019-09-27 DIAGNOSIS — E871 Hypo-osmolality and hyponatremia: Secondary | ICD-10-CM | POA: Diagnosis present

## 2019-09-27 DIAGNOSIS — E1122 Type 2 diabetes mellitus with diabetic chronic kidney disease: Secondary | ICD-10-CM | POA: Diagnosis not present

## 2019-09-27 DIAGNOSIS — R509 Fever, unspecified: Secondary | ICD-10-CM | POA: Diagnosis not present

## 2019-09-27 DIAGNOSIS — A414 Sepsis due to anaerobes: Secondary | ICD-10-CM | POA: Diagnosis not present

## 2019-09-27 DIAGNOSIS — I313 Pericardial effusion (noninflammatory): Secondary | ICD-10-CM | POA: Diagnosis not present

## 2019-09-27 DIAGNOSIS — B967 Clostridium perfringens [C. perfringens] as the cause of diseases classified elsewhere: Secondary | ICD-10-CM | POA: Diagnosis present

## 2019-09-27 DIAGNOSIS — I509 Heart failure, unspecified: Secondary | ICD-10-CM | POA: Diagnosis not present

## 2019-09-27 DIAGNOSIS — R7989 Other specified abnormal findings of blood chemistry: Secondary | ICD-10-CM | POA: Diagnosis not present

## 2019-09-27 DIAGNOSIS — Z4803 Encounter for change or removal of drains: Secondary | ICD-10-CM | POA: Diagnosis not present

## 2019-09-27 DIAGNOSIS — Z86711 Personal history of pulmonary embolism: Secondary | ICD-10-CM | POA: Diagnosis not present

## 2019-09-27 DIAGNOSIS — R791 Abnormal coagulation profile: Secondary | ICD-10-CM | POA: Diagnosis present

## 2019-09-27 DIAGNOSIS — S82891D Other fracture of right lower leg, subsequent encounter for closed fracture with routine healing: Secondary | ICD-10-CM | POA: Diagnosis not present

## 2019-09-27 DIAGNOSIS — D733 Abscess of spleen: Secondary | ICD-10-CM | POA: Diagnosis present

## 2019-09-27 DIAGNOSIS — E876 Hypokalemia: Secondary | ICD-10-CM | POA: Diagnosis not present

## 2019-09-27 DIAGNOSIS — S22009A Unspecified fracture of unspecified thoracic vertebra, initial encounter for closed fracture: Secondary | ICD-10-CM | POA: Diagnosis present

## 2019-09-27 DIAGNOSIS — R2689 Other abnormalities of gait and mobility: Secondary | ICD-10-CM | POA: Diagnosis not present

## 2019-09-27 DIAGNOSIS — A419 Sepsis, unspecified organism: Secondary | ICD-10-CM | POA: Diagnosis not present

## 2019-09-27 DIAGNOSIS — K7589 Other specified inflammatory liver diseases: Secondary | ICD-10-CM | POA: Diagnosis not present

## 2019-09-27 DIAGNOSIS — T82868A Thrombosis of vascular prosthetic devices, implants and grafts, initial encounter: Secondary | ICD-10-CM | POA: Diagnosis present

## 2019-09-27 DIAGNOSIS — R1030 Lower abdominal pain, unspecified: Secondary | ICD-10-CM | POA: Diagnosis not present

## 2019-09-27 DIAGNOSIS — Z7901 Long term (current) use of anticoagulants: Secondary | ICD-10-CM | POA: Diagnosis not present

## 2019-09-27 DIAGNOSIS — I2699 Other pulmonary embolism without acute cor pulmonale: Secondary | ICD-10-CM | POA: Diagnosis not present

## 2019-09-27 DIAGNOSIS — R1084 Generalized abdominal pain: Secondary | ICD-10-CM | POA: Diagnosis not present

## 2019-09-27 DIAGNOSIS — Z743 Need for continuous supervision: Secondary | ICD-10-CM | POA: Diagnosis not present

## 2019-09-27 DIAGNOSIS — I2102 ST elevation (STEMI) myocardial infarction involving left anterior descending coronary artery: Secondary | ICD-10-CM | POA: Diagnosis not present

## 2019-09-27 DIAGNOSIS — E8809 Other disorders of plasma-protein metabolism, not elsewhere classified: Secondary | ICD-10-CM | POA: Diagnosis not present

## 2019-09-27 DIAGNOSIS — D631 Anemia in chronic kidney disease: Secondary | ICD-10-CM | POA: Diagnosis not present

## 2019-09-27 DIAGNOSIS — R1032 Left lower quadrant pain: Secondary | ICD-10-CM | POA: Diagnosis not present

## 2019-09-27 DIAGNOSIS — I8290 Acute embolism and thrombosis of unspecified vein: Secondary | ICD-10-CM | POA: Diagnosis not present

## 2019-09-27 DIAGNOSIS — K219 Gastro-esophageal reflux disease without esophagitis: Secondary | ICD-10-CM | POA: Diagnosis not present

## 2019-09-27 DIAGNOSIS — R570 Cardiogenic shock: Secondary | ICD-10-CM | POA: Diagnosis not present

## 2019-09-27 DIAGNOSIS — R0602 Shortness of breath: Secondary | ICD-10-CM | POA: Diagnosis not present

## 2019-09-27 DIAGNOSIS — I314 Cardiac tamponade: Secondary | ICD-10-CM | POA: Diagnosis not present

## 2019-09-27 DIAGNOSIS — R9431 Abnormal electrocardiogram [ECG] [EKG]: Secondary | ICD-10-CM | POA: Diagnosis not present

## 2019-09-27 DIAGNOSIS — I4581 Long QT syndrome: Secondary | ICD-10-CM | POA: Diagnosis not present

## 2019-09-27 DIAGNOSIS — J90 Pleural effusion, not elsewhere classified: Secondary | ICD-10-CM | POA: Diagnosis not present

## 2019-09-27 DIAGNOSIS — R001 Bradycardia, unspecified: Secondary | ICD-10-CM | POA: Diagnosis not present

## 2019-09-27 DIAGNOSIS — R188 Other ascites: Secondary | ICD-10-CM | POA: Diagnosis not present

## 2019-09-27 DIAGNOSIS — I4589 Other specified conduction disorders: Secondary | ICD-10-CM | POA: Diagnosis not present

## 2019-09-27 DIAGNOSIS — R141 Gas pain: Secondary | ICD-10-CM | POA: Diagnosis not present

## 2019-10-31 DIAGNOSIS — R791 Abnormal coagulation profile: Secondary | ICD-10-CM | POA: Diagnosis present

## 2019-10-31 DIAGNOSIS — D733 Abscess of spleen: Secondary | ICD-10-CM | POA: Diagnosis present

## 2019-10-31 DIAGNOSIS — Z7982 Long term (current) use of aspirin: Secondary | ICD-10-CM | POA: Diagnosis not present

## 2019-10-31 DIAGNOSIS — S22070D Wedge compression fracture of T9-T10 vertebra, subsequent encounter for fracture with routine healing: Secondary | ICD-10-CM | POA: Diagnosis not present

## 2019-10-31 DIAGNOSIS — Z1159 Encounter for screening for other viral diseases: Secondary | ICD-10-CM | POA: Diagnosis not present

## 2019-10-31 DIAGNOSIS — G9340 Encephalopathy, unspecified: Secondary | ICD-10-CM | POA: Diagnosis not present

## 2019-10-31 DIAGNOSIS — Z049 Encounter for examination and observation for unspecified reason: Secondary | ICD-10-CM | POA: Diagnosis not present

## 2019-10-31 DIAGNOSIS — R1311 Dysphagia, oral phase: Secondary | ICD-10-CM | POA: Diagnosis not present

## 2019-10-31 DIAGNOSIS — I120 Hypertensive chronic kidney disease with stage 5 chronic kidney disease or end stage renal disease: Secondary | ICD-10-CM | POA: Diagnosis present

## 2019-10-31 DIAGNOSIS — R29898 Other symptoms and signs involving the musculoskeletal system: Secondary | ICD-10-CM | POA: Diagnosis not present

## 2019-10-31 DIAGNOSIS — Z736 Limitation of activities due to disability: Secondary | ICD-10-CM | POA: Diagnosis not present

## 2019-10-31 DIAGNOSIS — R52 Pain, unspecified: Secondary | ICD-10-CM | POA: Diagnosis not present

## 2019-10-31 DIAGNOSIS — Z7902 Long term (current) use of antithrombotics/antiplatelets: Secondary | ICD-10-CM | POA: Diagnosis not present

## 2019-10-31 DIAGNOSIS — I314 Cardiac tamponade: Secondary | ICD-10-CM | POA: Diagnosis not present

## 2019-10-31 DIAGNOSIS — M6281 Muscle weakness (generalized): Secondary | ICD-10-CM | POA: Diagnosis not present

## 2019-10-31 DIAGNOSIS — M62838 Other muscle spasm: Secondary | ICD-10-CM | POA: Diagnosis not present

## 2019-10-31 DIAGNOSIS — Z86711 Personal history of pulmonary embolism: Secondary | ICD-10-CM | POA: Diagnosis not present

## 2019-10-31 DIAGNOSIS — F331 Major depressive disorder, recurrent, moderate: Secondary | ICD-10-CM | POA: Diagnosis not present

## 2019-10-31 DIAGNOSIS — A419 Sepsis, unspecified organism: Secondary | ICD-10-CM | POA: Diagnosis not present

## 2019-10-31 DIAGNOSIS — I313 Pericardial effusion (noninflammatory): Secondary | ICD-10-CM | POA: Diagnosis present

## 2019-10-31 DIAGNOSIS — E1122 Type 2 diabetes mellitus with diabetic chronic kidney disease: Secondary | ICD-10-CM | POA: Diagnosis present

## 2019-10-31 DIAGNOSIS — I513 Intracardiac thrombosis, not elsewhere classified: Secondary | ICD-10-CM | POA: Diagnosis present

## 2019-10-31 DIAGNOSIS — J9811 Atelectasis: Secondary | ICD-10-CM | POA: Diagnosis present

## 2019-10-31 DIAGNOSIS — S22089D Unspecified fracture of T11-T12 vertebra, subsequent encounter for fracture with routine healing: Secondary | ICD-10-CM | POA: Diagnosis not present

## 2019-10-31 DIAGNOSIS — Z888 Allergy status to other drugs, medicaments and biological substances status: Secondary | ICD-10-CM | POA: Diagnosis not present

## 2019-10-31 DIAGNOSIS — I509 Heart failure, unspecified: Secondary | ICD-10-CM | POA: Diagnosis not present

## 2019-10-31 DIAGNOSIS — E876 Hypokalemia: Secondary | ICD-10-CM | POA: Diagnosis not present

## 2019-10-31 DIAGNOSIS — I70208 Unspecified atherosclerosis of native arteries of extremities, other extremity: Secondary | ICD-10-CM | POA: Diagnosis not present

## 2019-10-31 DIAGNOSIS — Z794 Long term (current) use of insulin: Secondary | ICD-10-CM | POA: Diagnosis not present

## 2019-10-31 DIAGNOSIS — R1084 Generalized abdominal pain: Secondary | ICD-10-CM | POA: Diagnosis not present

## 2019-10-31 DIAGNOSIS — R570 Cardiogenic shock: Secondary | ICD-10-CM | POA: Diagnosis not present

## 2019-10-31 DIAGNOSIS — S2249XD Multiple fractures of ribs, unspecified side, subsequent encounter for fracture with routine healing: Secondary | ICD-10-CM | POA: Diagnosis not present

## 2019-10-31 DIAGNOSIS — I319 Disease of pericardium, unspecified: Secondary | ICD-10-CM | POA: Diagnosis not present

## 2019-10-31 DIAGNOSIS — I480 Paroxysmal atrial fibrillation: Secondary | ICD-10-CM | POA: Diagnosis not present

## 2019-10-31 DIAGNOSIS — N289 Disorder of kidney and ureter, unspecified: Secondary | ICD-10-CM | POA: Diagnosis present

## 2019-10-31 DIAGNOSIS — N2581 Secondary hyperparathyroidism of renal origin: Secondary | ICD-10-CM | POA: Diagnosis not present

## 2019-10-31 DIAGNOSIS — G4733 Obstructive sleep apnea (adult) (pediatric): Secondary | ICD-10-CM | POA: Diagnosis present

## 2019-10-31 DIAGNOSIS — Z20822 Contact with and (suspected) exposure to covid-19: Secondary | ICD-10-CM | POA: Diagnosis present

## 2019-10-31 DIAGNOSIS — K219 Gastro-esophageal reflux disease without esophagitis: Secondary | ICD-10-CM | POA: Diagnosis present

## 2019-10-31 DIAGNOSIS — A414 Sepsis due to anaerobes: Secondary | ICD-10-CM | POA: Diagnosis not present

## 2019-10-31 DIAGNOSIS — S2242XD Multiple fractures of ribs, left side, subsequent encounter for fracture with routine healing: Secondary | ICD-10-CM | POA: Diagnosis not present

## 2019-10-31 DIAGNOSIS — R778 Other specified abnormalities of plasma proteins: Secondary | ICD-10-CM | POA: Diagnosis present

## 2019-10-31 DIAGNOSIS — S27329D Contusion of lung, unspecified, subsequent encounter: Secondary | ICD-10-CM | POA: Diagnosis not present

## 2019-10-31 DIAGNOSIS — Z87891 Personal history of nicotine dependence: Secondary | ICD-10-CM | POA: Diagnosis not present

## 2019-10-31 DIAGNOSIS — G9341 Metabolic encephalopathy: Secondary | ICD-10-CM | POA: Diagnosis present

## 2019-10-31 DIAGNOSIS — E1022 Type 1 diabetes mellitus with diabetic chronic kidney disease: Secondary | ICD-10-CM | POA: Diagnosis not present

## 2019-10-31 DIAGNOSIS — Z7901 Long term (current) use of anticoagulants: Secondary | ICD-10-CM | POA: Diagnosis not present

## 2019-10-31 DIAGNOSIS — Z992 Dependence on renal dialysis: Secondary | ICD-10-CM | POA: Diagnosis not present

## 2019-10-31 DIAGNOSIS — I132 Hypertensive heart and chronic kidney disease with heart failure and with stage 5 chronic kidney disease, or end stage renal disease: Secondary | ICD-10-CM | POA: Diagnosis not present

## 2019-10-31 DIAGNOSIS — D7889 Other postprocedural complications of the spleen: Secondary | ICD-10-CM | POA: Diagnosis not present

## 2019-10-31 DIAGNOSIS — I222 Subsequent non-ST elevation (NSTEMI) myocardial infarction: Secondary | ICD-10-CM | POA: Diagnosis not present

## 2019-10-31 DIAGNOSIS — S22058D Other fracture of T5-T6 vertebra, subsequent encounter for fracture with routine healing: Secondary | ICD-10-CM | POA: Diagnosis not present

## 2019-10-31 DIAGNOSIS — Z743 Need for continuous supervision: Secondary | ICD-10-CM | POA: Diagnosis not present

## 2019-10-31 DIAGNOSIS — D509 Iron deficiency anemia, unspecified: Secondary | ICD-10-CM | POA: Diagnosis not present

## 2019-10-31 DIAGNOSIS — E8770 Fluid overload, unspecified: Secondary | ICD-10-CM | POA: Diagnosis present

## 2019-10-31 DIAGNOSIS — E11649 Type 2 diabetes mellitus with hypoglycemia without coma: Secondary | ICD-10-CM | POA: Diagnosis present

## 2019-10-31 DIAGNOSIS — E8809 Other disorders of plasma-protein metabolism, not elsewhere classified: Secondary | ICD-10-CM | POA: Diagnosis not present

## 2019-10-31 DIAGNOSIS — D649 Anemia, unspecified: Secondary | ICD-10-CM | POA: Diagnosis present

## 2019-10-31 DIAGNOSIS — M5413 Radiculopathy, cervicothoracic region: Secondary | ICD-10-CM | POA: Diagnosis not present

## 2019-10-31 DIAGNOSIS — E871 Hypo-osmolality and hyponatremia: Secondary | ICD-10-CM | POA: Diagnosis not present

## 2019-10-31 DIAGNOSIS — N186 End stage renal disease: Secondary | ICD-10-CM | POA: Diagnosis present

## 2019-10-31 DIAGNOSIS — R2689 Other abnormalities of gait and mobility: Secondary | ICD-10-CM | POA: Diagnosis not present

## 2019-10-31 DIAGNOSIS — I2699 Other pulmonary embolism without acute cor pulmonale: Secondary | ICD-10-CM | POA: Diagnosis not present

## 2019-10-31 DIAGNOSIS — S82891D Other fracture of right lower leg, subsequent encounter for closed fracture with routine healing: Secondary | ICD-10-CM | POA: Diagnosis not present

## 2019-11-02 DIAGNOSIS — N2581 Secondary hyperparathyroidism of renal origin: Secondary | ICD-10-CM | POA: Diagnosis not present

## 2019-11-02 DIAGNOSIS — D509 Iron deficiency anemia, unspecified: Secondary | ICD-10-CM | POA: Diagnosis not present

## 2019-11-02 DIAGNOSIS — N186 End stage renal disease: Secondary | ICD-10-CM | POA: Diagnosis not present

## 2019-11-04 DIAGNOSIS — S22089D Unspecified fracture of T11-T12 vertebra, subsequent encounter for fracture with routine healing: Secondary | ICD-10-CM | POA: Diagnosis not present

## 2019-11-04 DIAGNOSIS — I509 Heart failure, unspecified: Secondary | ICD-10-CM | POA: Diagnosis not present

## 2019-11-04 DIAGNOSIS — M5413 Radiculopathy, cervicothoracic region: Secondary | ICD-10-CM | POA: Diagnosis not present

## 2019-11-04 DIAGNOSIS — M6281 Muscle weakness (generalized): Secondary | ICD-10-CM | POA: Diagnosis not present

## 2019-11-04 DIAGNOSIS — I2699 Other pulmonary embolism without acute cor pulmonale: Secondary | ICD-10-CM | POA: Diagnosis not present

## 2019-11-04 DIAGNOSIS — S2249XD Multiple fractures of ribs, unspecified side, subsequent encounter for fracture with routine healing: Secondary | ICD-10-CM | POA: Diagnosis not present

## 2019-11-04 DIAGNOSIS — R52 Pain, unspecified: Secondary | ICD-10-CM | POA: Diagnosis not present

## 2019-11-04 DIAGNOSIS — M62838 Other muscle spasm: Secondary | ICD-10-CM | POA: Diagnosis not present

## 2019-11-04 DIAGNOSIS — S22058D Other fracture of T5-T6 vertebra, subsequent encounter for fracture with routine healing: Secondary | ICD-10-CM | POA: Diagnosis not present

## 2019-11-04 DIAGNOSIS — S27329D Contusion of lung, unspecified, subsequent encounter: Secondary | ICD-10-CM | POA: Diagnosis not present

## 2019-11-04 DIAGNOSIS — S22070D Wedge compression fracture of T9-T10 vertebra, subsequent encounter for fracture with routine healing: Secondary | ICD-10-CM | POA: Diagnosis not present

## 2019-11-05 DIAGNOSIS — S22089D Unspecified fracture of T11-T12 vertebra, subsequent encounter for fracture with routine healing: Secondary | ICD-10-CM | POA: Diagnosis not present

## 2019-11-05 DIAGNOSIS — I2699 Other pulmonary embolism without acute cor pulmonale: Secondary | ICD-10-CM | POA: Diagnosis not present

## 2019-11-05 DIAGNOSIS — M5413 Radiculopathy, cervicothoracic region: Secondary | ICD-10-CM | POA: Diagnosis not present

## 2019-11-05 DIAGNOSIS — S22070D Wedge compression fracture of T9-T10 vertebra, subsequent encounter for fracture with routine healing: Secondary | ICD-10-CM | POA: Diagnosis not present

## 2019-11-05 DIAGNOSIS — S22058D Other fracture of T5-T6 vertebra, subsequent encounter for fracture with routine healing: Secondary | ICD-10-CM | POA: Diagnosis not present

## 2019-11-05 DIAGNOSIS — S2249XD Multiple fractures of ribs, unspecified side, subsequent encounter for fracture with routine healing: Secondary | ICD-10-CM | POA: Diagnosis not present

## 2019-11-05 DIAGNOSIS — M6281 Muscle weakness (generalized): Secondary | ICD-10-CM | POA: Diagnosis not present

## 2019-11-05 DIAGNOSIS — S27329D Contusion of lung, unspecified, subsequent encounter: Secondary | ICD-10-CM | POA: Diagnosis not present

## 2019-11-06 DIAGNOSIS — D509 Iron deficiency anemia, unspecified: Secondary | ICD-10-CM | POA: Diagnosis not present

## 2019-11-06 DIAGNOSIS — N186 End stage renal disease: Secondary | ICD-10-CM | POA: Diagnosis not present

## 2019-11-06 DIAGNOSIS — N2581 Secondary hyperparathyroidism of renal origin: Secondary | ICD-10-CM | POA: Diagnosis not present

## 2019-11-07 DIAGNOSIS — G9341 Metabolic encephalopathy: Secondary | ICD-10-CM | POA: Diagnosis not present

## 2019-11-07 DIAGNOSIS — N186 End stage renal disease: Secondary | ICD-10-CM | POA: Diagnosis not present

## 2019-11-07 DIAGNOSIS — D733 Abscess of spleen: Secondary | ICD-10-CM | POA: Diagnosis not present

## 2019-11-07 DIAGNOSIS — G9340 Encephalopathy, unspecified: Secondary | ICD-10-CM | POA: Diagnosis not present

## 2019-11-07 DIAGNOSIS — J9811 Atelectasis: Secondary | ICD-10-CM | POA: Diagnosis not present

## 2019-11-07 DIAGNOSIS — I319 Disease of pericardium, unspecified: Secondary | ICD-10-CM | POA: Diagnosis not present

## 2019-11-07 DIAGNOSIS — I313 Pericardial effusion (noninflammatory): Secondary | ICD-10-CM | POA: Diagnosis not present

## 2019-11-07 DIAGNOSIS — Z20822 Contact with and (suspected) exposure to covid-19: Secondary | ICD-10-CM | POA: Diagnosis not present

## 2019-11-07 DIAGNOSIS — D7889 Other postprocedural complications of the spleen: Secondary | ICD-10-CM | POA: Diagnosis not present

## 2019-11-07 DIAGNOSIS — Z87891 Personal history of nicotine dependence: Secondary | ICD-10-CM | POA: Diagnosis not present

## 2019-11-07 DIAGNOSIS — I70208 Unspecified atherosclerosis of native arteries of extremities, other extremity: Secondary | ICD-10-CM | POA: Diagnosis not present

## 2019-11-08 DIAGNOSIS — Z7901 Long term (current) use of anticoagulants: Secondary | ICD-10-CM | POA: Diagnosis not present

## 2019-11-08 DIAGNOSIS — I120 Hypertensive chronic kidney disease with stage 5 chronic kidney disease or end stage renal disease: Secondary | ICD-10-CM | POA: Diagnosis not present

## 2019-11-08 DIAGNOSIS — J90 Pleural effusion, not elsewhere classified: Secondary | ICD-10-CM | POA: Diagnosis not present

## 2019-11-08 DIAGNOSIS — I318 Other specified diseases of pericardium: Secondary | ICD-10-CM | POA: Diagnosis not present

## 2019-11-08 DIAGNOSIS — R918 Other nonspecific abnormal finding of lung field: Secondary | ICD-10-CM | POA: Diagnosis not present

## 2019-11-08 DIAGNOSIS — I70208 Unspecified atherosclerosis of native arteries of extremities, other extremity: Secondary | ICD-10-CM | POA: Diagnosis not present

## 2019-11-08 DIAGNOSIS — I2699 Other pulmonary embolism without acute cor pulmonale: Secondary | ICD-10-CM | POA: Diagnosis not present

## 2019-11-08 DIAGNOSIS — R791 Abnormal coagulation profile: Secondary | ICD-10-CM | POA: Diagnosis not present

## 2019-11-08 DIAGNOSIS — Z992 Dependence on renal dialysis: Secondary | ICD-10-CM | POA: Diagnosis not present

## 2019-11-08 DIAGNOSIS — G9340 Encephalopathy, unspecified: Secondary | ICD-10-CM | POA: Diagnosis not present

## 2019-11-08 DIAGNOSIS — N186 End stage renal disease: Secondary | ICD-10-CM | POA: Diagnosis not present

## 2019-11-08 DIAGNOSIS — D733 Abscess of spleen: Secondary | ICD-10-CM | POA: Diagnosis not present

## 2019-11-08 DIAGNOSIS — I313 Pericardial effusion (noninflammatory): Secondary | ICD-10-CM | POA: Diagnosis not present

## 2019-11-08 DIAGNOSIS — I314 Cardiac tamponade: Secondary | ICD-10-CM | POA: Diagnosis not present

## 2019-11-08 DIAGNOSIS — R001 Bradycardia, unspecified: Secondary | ICD-10-CM | POA: Diagnosis not present

## 2019-11-08 DIAGNOSIS — Z86711 Personal history of pulmonary embolism: Secondary | ICD-10-CM | POA: Diagnosis not present

## 2019-11-08 DIAGNOSIS — E1122 Type 2 diabetes mellitus with diabetic chronic kidney disease: Secondary | ICD-10-CM | POA: Diagnosis not present

## 2019-11-08 DIAGNOSIS — D7389 Other diseases of spleen: Secondary | ICD-10-CM | POA: Diagnosis not present

## 2019-11-09 DIAGNOSIS — Z888 Allergy status to other drugs, medicaments and biological substances status: Secondary | ICD-10-CM | POA: Diagnosis not present

## 2019-11-09 DIAGNOSIS — R791 Abnormal coagulation profile: Secondary | ICD-10-CM | POA: Diagnosis present

## 2019-11-09 DIAGNOSIS — K219 Gastro-esophageal reflux disease without esophagitis: Secondary | ICD-10-CM | POA: Diagnosis present

## 2019-11-09 DIAGNOSIS — I513 Intracardiac thrombosis, not elsewhere classified: Secondary | ICD-10-CM | POA: Diagnosis present

## 2019-11-09 DIAGNOSIS — I70208 Unspecified atherosclerosis of native arteries of extremities, other extremity: Secondary | ICD-10-CM | POA: Diagnosis not present

## 2019-11-09 DIAGNOSIS — D739 Disease of spleen, unspecified: Secondary | ICD-10-CM | POA: Diagnosis not present

## 2019-11-09 DIAGNOSIS — E119 Type 2 diabetes mellitus without complications: Secondary | ICD-10-CM | POA: Diagnosis not present

## 2019-11-09 DIAGNOSIS — Z7901 Long term (current) use of anticoagulants: Secondary | ICD-10-CM | POA: Diagnosis not present

## 2019-11-09 DIAGNOSIS — N186 End stage renal disease: Secondary | ICD-10-CM | POA: Diagnosis present

## 2019-11-09 DIAGNOSIS — S22009D Unspecified fracture of unspecified thoracic vertebra, subsequent encounter for fracture with routine healing: Secondary | ICD-10-CM | POA: Diagnosis not present

## 2019-11-09 DIAGNOSIS — I82409 Acute embolism and thrombosis of unspecified deep veins of unspecified lower extremity: Secondary | ICD-10-CM | POA: Diagnosis not present

## 2019-11-09 DIAGNOSIS — I491 Atrial premature depolarization: Secondary | ICD-10-CM | POA: Diagnosis not present

## 2019-11-09 DIAGNOSIS — G9341 Metabolic encephalopathy: Secondary | ICD-10-CM | POA: Diagnosis present

## 2019-11-09 DIAGNOSIS — M6281 Muscle weakness (generalized): Secondary | ICD-10-CM | POA: Diagnosis not present

## 2019-11-09 DIAGNOSIS — I2699 Other pulmonary embolism without acute cor pulmonale: Secondary | ICD-10-CM | POA: Diagnosis not present

## 2019-11-09 DIAGNOSIS — D7889 Other postprocedural complications of the spleen: Secondary | ICD-10-CM | POA: Diagnosis not present

## 2019-11-09 DIAGNOSIS — Z20822 Contact with and (suspected) exposure to covid-19: Secondary | ICD-10-CM | POA: Diagnosis present

## 2019-11-09 DIAGNOSIS — I319 Disease of pericardium, unspecified: Secondary | ICD-10-CM | POA: Diagnosis present

## 2019-11-09 DIAGNOSIS — I313 Pericardial effusion (noninflammatory): Secondary | ICD-10-CM | POA: Diagnosis present

## 2019-11-09 DIAGNOSIS — Z86711 Personal history of pulmonary embolism: Secondary | ICD-10-CM | POA: Diagnosis not present

## 2019-11-09 DIAGNOSIS — I2782 Chronic pulmonary embolism: Secondary | ICD-10-CM | POA: Diagnosis not present

## 2019-11-09 DIAGNOSIS — R778 Other specified abnormalities of plasma proteins: Secondary | ICD-10-CM | POA: Diagnosis present

## 2019-11-09 DIAGNOSIS — S2242XD Multiple fractures of ribs, left side, subsequent encounter for fracture with routine healing: Secondary | ICD-10-CM | POA: Diagnosis not present

## 2019-11-09 DIAGNOSIS — N289 Disorder of kidney and ureter, unspecified: Secondary | ICD-10-CM | POA: Diagnosis present

## 2019-11-09 DIAGNOSIS — D638 Anemia in other chronic diseases classified elsewhere: Secondary | ICD-10-CM | POA: Diagnosis not present

## 2019-11-09 DIAGNOSIS — J9811 Atelectasis: Secondary | ICD-10-CM | POA: Diagnosis present

## 2019-11-09 DIAGNOSIS — Z7902 Long term (current) use of antithrombotics/antiplatelets: Secondary | ICD-10-CM | POA: Diagnosis not present

## 2019-11-09 DIAGNOSIS — J449 Chronic obstructive pulmonary disease, unspecified: Secondary | ICD-10-CM | POA: Diagnosis not present

## 2019-11-09 DIAGNOSIS — G4733 Obstructive sleep apnea (adult) (pediatric): Secondary | ICD-10-CM | POA: Diagnosis present

## 2019-11-09 DIAGNOSIS — G9340 Encephalopathy, unspecified: Secondary | ICD-10-CM | POA: Diagnosis not present

## 2019-11-09 DIAGNOSIS — Z794 Long term (current) use of insulin: Secondary | ICD-10-CM | POA: Diagnosis not present

## 2019-11-09 DIAGNOSIS — R269 Unspecified abnormalities of gait and mobility: Secondary | ICD-10-CM | POA: Diagnosis not present

## 2019-11-09 DIAGNOSIS — I10 Essential (primary) hypertension: Secondary | ICD-10-CM | POA: Diagnosis not present

## 2019-11-09 DIAGNOSIS — R531 Weakness: Secondary | ICD-10-CM | POA: Diagnosis not present

## 2019-11-09 DIAGNOSIS — E8770 Fluid overload, unspecified: Secondary | ICD-10-CM | POA: Diagnosis present

## 2019-11-09 DIAGNOSIS — E1122 Type 2 diabetes mellitus with diabetic chronic kidney disease: Secondary | ICD-10-CM | POA: Diagnosis present

## 2019-11-09 DIAGNOSIS — Z7982 Long term (current) use of aspirin: Secondary | ICD-10-CM | POA: Diagnosis not present

## 2019-11-09 DIAGNOSIS — E11649 Type 2 diabetes mellitus with hypoglycemia without coma: Secondary | ICD-10-CM | POA: Diagnosis present

## 2019-11-09 DIAGNOSIS — D649 Anemia, unspecified: Secondary | ICD-10-CM | POA: Diagnosis present

## 2019-11-09 DIAGNOSIS — J918 Pleural effusion in other conditions classified elsewhere: Secondary | ICD-10-CM | POA: Diagnosis not present

## 2019-11-09 DIAGNOSIS — R279 Unspecified lack of coordination: Secondary | ICD-10-CM | POA: Diagnosis not present

## 2019-11-09 DIAGNOSIS — Z4803 Encounter for change or removal of drains: Secondary | ICD-10-CM | POA: Diagnosis not present

## 2019-11-09 DIAGNOSIS — I120 Hypertensive chronic kidney disease with stage 5 chronic kidney disease or end stage renal disease: Secondary | ICD-10-CM | POA: Diagnosis present

## 2019-11-09 DIAGNOSIS — I82629 Acute embolism and thrombosis of deep veins of unspecified upper extremity: Secondary | ICD-10-CM | POA: Diagnosis not present

## 2019-11-09 DIAGNOSIS — Z992 Dependence on renal dialysis: Secondary | ICD-10-CM | POA: Diagnosis not present

## 2019-11-09 DIAGNOSIS — R9431 Abnormal electrocardiogram [ECG] [EKG]: Secondary | ICD-10-CM | POA: Diagnosis not present

## 2019-11-09 DIAGNOSIS — D733 Abscess of spleen: Secondary | ICD-10-CM | POA: Diagnosis present

## 2019-11-09 DIAGNOSIS — Z743 Need for continuous supervision: Secondary | ICD-10-CM | POA: Diagnosis not present

## 2019-11-09 DIAGNOSIS — R2681 Unsteadiness on feet: Secondary | ICD-10-CM | POA: Diagnosis not present

## 2019-11-09 DIAGNOSIS — J90 Pleural effusion, not elsewhere classified: Secondary | ICD-10-CM | POA: Diagnosis not present

## 2019-11-10 DIAGNOSIS — I513 Intracardiac thrombosis, not elsewhere classified: Secondary | ICD-10-CM | POA: Diagnosis not present

## 2019-11-10 DIAGNOSIS — D733 Abscess of spleen: Secondary | ICD-10-CM | POA: Diagnosis not present

## 2019-11-10 DIAGNOSIS — N186 End stage renal disease: Secondary | ICD-10-CM | POA: Diagnosis not present

## 2019-11-10 DIAGNOSIS — Z7901 Long term (current) use of anticoagulants: Secondary | ICD-10-CM | POA: Diagnosis not present

## 2019-11-10 DIAGNOSIS — E1122 Type 2 diabetes mellitus with diabetic chronic kidney disease: Secondary | ICD-10-CM | POA: Diagnosis not present

## 2019-11-10 DIAGNOSIS — D649 Anemia, unspecified: Secondary | ICD-10-CM | POA: Diagnosis not present

## 2019-11-10 DIAGNOSIS — I491 Atrial premature depolarization: Secondary | ICD-10-CM | POA: Diagnosis not present

## 2019-11-10 DIAGNOSIS — Z992 Dependence on renal dialysis: Secondary | ICD-10-CM | POA: Diagnosis not present

## 2019-11-10 DIAGNOSIS — D739 Disease of spleen, unspecified: Secondary | ICD-10-CM | POA: Diagnosis not present

## 2019-11-10 DIAGNOSIS — I120 Hypertensive chronic kidney disease with stage 5 chronic kidney disease or end stage renal disease: Secondary | ICD-10-CM | POA: Diagnosis not present

## 2019-11-10 DIAGNOSIS — J918 Pleural effusion in other conditions classified elsewhere: Secondary | ICD-10-CM | POA: Diagnosis not present

## 2019-11-10 DIAGNOSIS — Z86711 Personal history of pulmonary embolism: Secondary | ICD-10-CM | POA: Diagnosis not present

## 2019-11-11 DIAGNOSIS — N186 End stage renal disease: Secondary | ICD-10-CM | POA: Diagnosis not present

## 2019-11-11 DIAGNOSIS — R9431 Abnormal electrocardiogram [ECG] [EKG]: Secondary | ICD-10-CM | POA: Diagnosis not present

## 2019-11-11 DIAGNOSIS — D733 Abscess of spleen: Secondary | ICD-10-CM | POA: Diagnosis not present

## 2019-11-11 DIAGNOSIS — Z992 Dependence on renal dialysis: Secondary | ICD-10-CM | POA: Diagnosis not present

## 2019-11-12 DIAGNOSIS — E1122 Type 2 diabetes mellitus with diabetic chronic kidney disease: Secondary | ICD-10-CM | POA: Diagnosis not present

## 2019-11-12 DIAGNOSIS — Z7901 Long term (current) use of anticoagulants: Secondary | ICD-10-CM | POA: Diagnosis not present

## 2019-11-12 DIAGNOSIS — Z86711 Personal history of pulmonary embolism: Secondary | ICD-10-CM | POA: Diagnosis not present

## 2019-11-12 DIAGNOSIS — R791 Abnormal coagulation profile: Secondary | ICD-10-CM | POA: Diagnosis not present

## 2019-11-12 DIAGNOSIS — Z4803 Encounter for change or removal of drains: Secondary | ICD-10-CM | POA: Diagnosis not present

## 2019-11-12 DIAGNOSIS — I120 Hypertensive chronic kidney disease with stage 5 chronic kidney disease or end stage renal disease: Secondary | ICD-10-CM | POA: Diagnosis not present

## 2019-11-12 DIAGNOSIS — N186 End stage renal disease: Secondary | ICD-10-CM | POA: Diagnosis not present

## 2019-11-12 DIAGNOSIS — Z992 Dependence on renal dialysis: Secondary | ICD-10-CM | POA: Diagnosis not present

## 2019-11-12 DIAGNOSIS — D733 Abscess of spleen: Secondary | ICD-10-CM | POA: Diagnosis not present

## 2019-11-12 DIAGNOSIS — Z794 Long term (current) use of insulin: Secondary | ICD-10-CM | POA: Diagnosis not present

## 2019-11-12 DIAGNOSIS — D638 Anemia in other chronic diseases classified elsewhere: Secondary | ICD-10-CM | POA: Diagnosis not present

## 2019-11-13 DIAGNOSIS — R9431 Abnormal electrocardiogram [ECG] [EKG]: Secondary | ICD-10-CM | POA: Diagnosis not present

## 2019-11-13 DIAGNOSIS — Z992 Dependence on renal dialysis: Secondary | ICD-10-CM | POA: Diagnosis not present

## 2019-11-13 DIAGNOSIS — N186 End stage renal disease: Secondary | ICD-10-CM | POA: Diagnosis not present

## 2019-11-14 DIAGNOSIS — N186 End stage renal disease: Secondary | ICD-10-CM | POA: Diagnosis not present

## 2019-11-14 DIAGNOSIS — I120 Hypertensive chronic kidney disease with stage 5 chronic kidney disease or end stage renal disease: Secondary | ICD-10-CM | POA: Diagnosis not present

## 2019-11-14 DIAGNOSIS — Z992 Dependence on renal dialysis: Secondary | ICD-10-CM | POA: Diagnosis not present

## 2019-11-14 DIAGNOSIS — D739 Disease of spleen, unspecified: Secondary | ICD-10-CM | POA: Diagnosis not present

## 2019-11-14 DIAGNOSIS — R9431 Abnormal electrocardiogram [ECG] [EKG]: Secondary | ICD-10-CM | POA: Diagnosis not present

## 2019-11-14 DIAGNOSIS — E1122 Type 2 diabetes mellitus with diabetic chronic kidney disease: Secondary | ICD-10-CM | POA: Diagnosis not present

## 2019-11-15 DIAGNOSIS — N186 End stage renal disease: Secondary | ICD-10-CM | POA: Diagnosis not present

## 2019-11-15 DIAGNOSIS — D739 Disease of spleen, unspecified: Secondary | ICD-10-CM | POA: Diagnosis not present

## 2019-11-15 DIAGNOSIS — I120 Hypertensive chronic kidney disease with stage 5 chronic kidney disease or end stage renal disease: Secondary | ICD-10-CM | POA: Diagnosis not present

## 2019-11-15 DIAGNOSIS — Z992 Dependence on renal dialysis: Secondary | ICD-10-CM | POA: Diagnosis not present

## 2019-11-15 DIAGNOSIS — E1122 Type 2 diabetes mellitus with diabetic chronic kidney disease: Secondary | ICD-10-CM | POA: Diagnosis not present

## 2019-11-16 DIAGNOSIS — Z992 Dependence on renal dialysis: Secondary | ICD-10-CM | POA: Diagnosis not present

## 2019-11-16 DIAGNOSIS — N186 End stage renal disease: Secondary | ICD-10-CM | POA: Diagnosis not present

## 2019-11-17 DIAGNOSIS — M3212 Pericarditis in systemic lupus erythematosus: Secondary | ICD-10-CM | POA: Diagnosis not present

## 2019-11-17 DIAGNOSIS — D7389 Other diseases of spleen: Secondary | ICD-10-CM | POA: Diagnosis not present

## 2019-11-17 DIAGNOSIS — M6281 Muscle weakness (generalized): Secondary | ICD-10-CM | POA: Diagnosis not present

## 2019-11-17 DIAGNOSIS — D631 Anemia in chronic kidney disease: Secondary | ICD-10-CM | POA: Diagnosis not present

## 2019-11-17 DIAGNOSIS — Z049 Encounter for examination and observation for unspecified reason: Secondary | ICD-10-CM | POA: Diagnosis not present

## 2019-11-17 DIAGNOSIS — G4733 Obstructive sleep apnea (adult) (pediatric): Secondary | ICD-10-CM | POA: Diagnosis not present

## 2019-11-17 DIAGNOSIS — Z8673 Personal history of transient ischemic attack (TIA), and cerebral infarction without residual deficits: Secondary | ICD-10-CM | POA: Diagnosis not present

## 2019-11-17 DIAGNOSIS — R2681 Unsteadiness on feet: Secondary | ICD-10-CM | POA: Diagnosis not present

## 2019-11-17 DIAGNOSIS — E1122 Type 2 diabetes mellitus with diabetic chronic kidney disease: Secondary | ICD-10-CM | POA: Diagnosis present

## 2019-11-17 DIAGNOSIS — I959 Hypotension, unspecified: Secondary | ICD-10-CM | POA: Diagnosis present

## 2019-11-17 DIAGNOSIS — I493 Ventricular premature depolarization: Secondary | ICD-10-CM | POA: Diagnosis not present

## 2019-11-17 DIAGNOSIS — N2581 Secondary hyperparathyroidism of renal origin: Secondary | ICD-10-CM | POA: Diagnosis not present

## 2019-11-17 DIAGNOSIS — J918 Pleural effusion in other conditions classified elsewhere: Secondary | ICD-10-CM | POA: Diagnosis not present

## 2019-11-17 DIAGNOSIS — D509 Iron deficiency anemia, unspecified: Secondary | ICD-10-CM | POA: Diagnosis not present

## 2019-11-17 DIAGNOSIS — Z86711 Personal history of pulmonary embolism: Secondary | ICD-10-CM | POA: Diagnosis not present

## 2019-11-17 DIAGNOSIS — R0602 Shortness of breath: Secondary | ICD-10-CM | POA: Diagnosis not present

## 2019-11-17 DIAGNOSIS — D733 Abscess of spleen: Secondary | ICD-10-CM | POA: Diagnosis present

## 2019-11-17 DIAGNOSIS — Z79899 Other long term (current) drug therapy: Secondary | ICD-10-CM | POA: Diagnosis not present

## 2019-11-17 DIAGNOSIS — M7989 Other specified soft tissue disorders: Secondary | ICD-10-CM | POA: Diagnosis not present

## 2019-11-17 DIAGNOSIS — I10 Essential (primary) hypertension: Secondary | ICD-10-CM | POA: Diagnosis not present

## 2019-11-17 DIAGNOSIS — I482 Chronic atrial fibrillation, unspecified: Secondary | ICD-10-CM | POA: Diagnosis not present

## 2019-11-17 DIAGNOSIS — I313 Pericardial effusion (noninflammatory): Secondary | ICD-10-CM | POA: Diagnosis present

## 2019-11-17 DIAGNOSIS — M4844XG Fatigue fracture of vertebra, thoracic region, subsequent encounter for fracture with delayed healing: Secondary | ICD-10-CM | POA: Diagnosis not present

## 2019-11-17 DIAGNOSIS — R9431 Abnormal electrocardiogram [ECG] [EKG]: Secondary | ICD-10-CM | POA: Diagnosis not present

## 2019-11-17 DIAGNOSIS — I70208 Unspecified atherosclerosis of native arteries of extremities, other extremity: Secondary | ICD-10-CM | POA: Diagnosis not present

## 2019-11-17 DIAGNOSIS — E119 Type 2 diabetes mellitus without complications: Secondary | ICD-10-CM | POA: Diagnosis not present

## 2019-11-17 DIAGNOSIS — I252 Old myocardial infarction: Secondary | ICD-10-CM | POA: Diagnosis not present

## 2019-11-17 DIAGNOSIS — Z87448 Personal history of other diseases of urinary system: Secondary | ICD-10-CM | POA: Diagnosis not present

## 2019-11-17 DIAGNOSIS — S22009D Unspecified fracture of unspecified thoracic vertebra, subsequent encounter for fracture with routine healing: Secondary | ICD-10-CM | POA: Diagnosis not present

## 2019-11-17 DIAGNOSIS — J90 Pleural effusion, not elsewhere classified: Secondary | ICD-10-CM | POA: Diagnosis not present

## 2019-11-17 DIAGNOSIS — R0689 Other abnormalities of breathing: Secondary | ICD-10-CM | POA: Diagnosis present

## 2019-11-17 DIAGNOSIS — I513 Intracardiac thrombosis, not elsewhere classified: Secondary | ICD-10-CM | POA: Diagnosis not present

## 2019-11-17 DIAGNOSIS — I2782 Chronic pulmonary embolism: Secondary | ICD-10-CM | POA: Diagnosis present

## 2019-11-17 DIAGNOSIS — I480 Paroxysmal atrial fibrillation: Secondary | ICD-10-CM | POA: Diagnosis not present

## 2019-11-17 DIAGNOSIS — I120 Hypertensive chronic kidney disease with stage 5 chronic kidney disease or end stage renal disease: Secondary | ICD-10-CM | POA: Diagnosis present

## 2019-11-17 DIAGNOSIS — R279 Unspecified lack of coordination: Secondary | ICD-10-CM | POA: Diagnosis not present

## 2019-11-17 DIAGNOSIS — S22081A Stable burst fracture of T11-T12 vertebra, initial encounter for closed fracture: Secondary | ICD-10-CM | POA: Diagnosis not present

## 2019-11-17 DIAGNOSIS — Z743 Need for continuous supervision: Secondary | ICD-10-CM | POA: Diagnosis not present

## 2019-11-17 DIAGNOSIS — S32009A Unspecified fracture of unspecified lumbar vertebra, initial encounter for closed fracture: Secondary | ICD-10-CM | POA: Diagnosis not present

## 2019-11-17 DIAGNOSIS — R791 Abnormal coagulation profile: Secondary | ICD-10-CM | POA: Diagnosis not present

## 2019-11-17 DIAGNOSIS — Z794 Long term (current) use of insulin: Secondary | ICD-10-CM | POA: Diagnosis not present

## 2019-11-17 DIAGNOSIS — G4700 Insomnia, unspecified: Secondary | ICD-10-CM | POA: Diagnosis not present

## 2019-11-17 DIAGNOSIS — Z87891 Personal history of nicotine dependence: Secondary | ICD-10-CM | POA: Diagnosis not present

## 2019-11-17 DIAGNOSIS — E8770 Fluid overload, unspecified: Secondary | ICD-10-CM | POA: Diagnosis not present

## 2019-11-17 DIAGNOSIS — Z20822 Contact with and (suspected) exposure to covid-19: Secondary | ICD-10-CM | POA: Diagnosis present

## 2019-11-17 DIAGNOSIS — S22009A Unspecified fracture of unspecified thoracic vertebra, initial encounter for closed fracture: Secondary | ICD-10-CM | POA: Diagnosis present

## 2019-11-17 DIAGNOSIS — D7889 Other postprocedural complications of the spleen: Secondary | ICD-10-CM | POA: Diagnosis not present

## 2019-11-17 DIAGNOSIS — S22078A Other fracture of T9-T10 vertebra, initial encounter for closed fracture: Secondary | ICD-10-CM | POA: Diagnosis not present

## 2019-11-17 DIAGNOSIS — J449 Chronic obstructive pulmonary disease, unspecified: Secondary | ICD-10-CM | POA: Diagnosis present

## 2019-11-17 DIAGNOSIS — R609 Edema, unspecified: Secondary | ICD-10-CM | POA: Diagnosis not present

## 2019-11-17 DIAGNOSIS — Z8679 Personal history of other diseases of the circulatory system: Secondary | ICD-10-CM | POA: Diagnosis not present

## 2019-11-17 DIAGNOSIS — Z7902 Long term (current) use of antithrombotics/antiplatelets: Secondary | ICD-10-CM | POA: Diagnosis not present

## 2019-11-17 DIAGNOSIS — Z7901 Long term (current) use of anticoagulants: Secondary | ICD-10-CM | POA: Diagnosis not present

## 2019-11-17 DIAGNOSIS — R269 Unspecified abnormalities of gait and mobility: Secondary | ICD-10-CM | POA: Diagnosis not present

## 2019-11-17 DIAGNOSIS — I498 Other specified cardiac arrhythmias: Secondary | ICD-10-CM | POA: Diagnosis not present

## 2019-11-17 DIAGNOSIS — Z8639 Personal history of other endocrine, nutritional and metabolic disease: Secondary | ICD-10-CM | POA: Diagnosis not present

## 2019-11-17 DIAGNOSIS — S8291XA Unspecified fracture of right lower leg, initial encounter for closed fracture: Secondary | ICD-10-CM | POA: Diagnosis not present

## 2019-11-17 DIAGNOSIS — Z992 Dependence on renal dialysis: Secondary | ICD-10-CM | POA: Diagnosis not present

## 2019-11-17 DIAGNOSIS — G9340 Encephalopathy, unspecified: Secondary | ICD-10-CM | POA: Diagnosis not present

## 2019-11-17 DIAGNOSIS — R5383 Other fatigue: Secondary | ICD-10-CM | POA: Diagnosis not present

## 2019-11-17 DIAGNOSIS — Z720 Tobacco use: Secondary | ICD-10-CM | POA: Diagnosis not present

## 2019-11-17 DIAGNOSIS — I2699 Other pulmonary embolism without acute cor pulmonale: Secondary | ICD-10-CM | POA: Diagnosis present

## 2019-11-17 DIAGNOSIS — N186 End stage renal disease: Secondary | ICD-10-CM | POA: Diagnosis present

## 2019-11-17 DIAGNOSIS — J9811 Atelectasis: Secondary | ICD-10-CM | POA: Diagnosis not present

## 2019-11-17 DIAGNOSIS — R531 Weakness: Secondary | ICD-10-CM | POA: Diagnosis not present

## 2019-11-17 DIAGNOSIS — R4182 Altered mental status, unspecified: Secondary | ICD-10-CM | POA: Diagnosis not present

## 2019-11-18 DIAGNOSIS — D631 Anemia in chronic kidney disease: Secondary | ICD-10-CM | POA: Diagnosis not present

## 2019-11-18 DIAGNOSIS — N186 End stage renal disease: Secondary | ICD-10-CM | POA: Diagnosis not present

## 2019-11-18 DIAGNOSIS — D509 Iron deficiency anemia, unspecified: Secondary | ICD-10-CM | POA: Diagnosis not present

## 2019-11-18 DIAGNOSIS — N2581 Secondary hyperparathyroidism of renal origin: Secondary | ICD-10-CM | POA: Diagnosis not present

## 2019-11-20 DIAGNOSIS — I482 Chronic atrial fibrillation, unspecified: Secondary | ICD-10-CM | POA: Diagnosis not present

## 2019-11-20 DIAGNOSIS — Z86711 Personal history of pulmonary embolism: Secondary | ICD-10-CM | POA: Diagnosis not present

## 2019-11-20 DIAGNOSIS — S32009A Unspecified fracture of unspecified lumbar vertebra, initial encounter for closed fracture: Secondary | ICD-10-CM | POA: Diagnosis not present

## 2019-11-20 DIAGNOSIS — D509 Iron deficiency anemia, unspecified: Secondary | ICD-10-CM | POA: Diagnosis not present

## 2019-11-20 DIAGNOSIS — D631 Anemia in chronic kidney disease: Secondary | ICD-10-CM | POA: Diagnosis not present

## 2019-11-20 DIAGNOSIS — E119 Type 2 diabetes mellitus without complications: Secondary | ICD-10-CM | POA: Diagnosis not present

## 2019-11-20 DIAGNOSIS — R4182 Altered mental status, unspecified: Secondary | ICD-10-CM | POA: Diagnosis not present

## 2019-11-20 DIAGNOSIS — R609 Edema, unspecified: Secondary | ICD-10-CM | POA: Diagnosis not present

## 2019-11-20 DIAGNOSIS — E8770 Fluid overload, unspecified: Secondary | ICD-10-CM | POA: Diagnosis not present

## 2019-11-20 DIAGNOSIS — G4700 Insomnia, unspecified: Secondary | ICD-10-CM | POA: Diagnosis not present

## 2019-11-20 DIAGNOSIS — D733 Abscess of spleen: Secondary | ICD-10-CM | POA: Diagnosis not present

## 2019-11-20 DIAGNOSIS — M3212 Pericarditis in systemic lupus erythematosus: Secondary | ICD-10-CM | POA: Diagnosis not present

## 2019-11-20 DIAGNOSIS — N186 End stage renal disease: Secondary | ICD-10-CM | POA: Diagnosis not present

## 2019-11-20 DIAGNOSIS — N2581 Secondary hyperparathyroidism of renal origin: Secondary | ICD-10-CM | POA: Diagnosis not present

## 2019-11-20 DIAGNOSIS — I480 Paroxysmal atrial fibrillation: Secondary | ICD-10-CM | POA: Diagnosis not present

## 2019-11-20 DIAGNOSIS — S8291XA Unspecified fracture of right lower leg, initial encounter for closed fracture: Secondary | ICD-10-CM | POA: Diagnosis not present

## 2019-11-22 DIAGNOSIS — Z87891 Personal history of nicotine dependence: Secondary | ICD-10-CM | POA: Diagnosis not present

## 2019-11-22 DIAGNOSIS — I498 Other specified cardiac arrhythmias: Secondary | ICD-10-CM | POA: Diagnosis not present

## 2019-11-22 DIAGNOSIS — Z8639 Personal history of other endocrine, nutritional and metabolic disease: Secondary | ICD-10-CM | POA: Diagnosis not present

## 2019-11-22 DIAGNOSIS — S22009A Unspecified fracture of unspecified thoracic vertebra, initial encounter for closed fracture: Secondary | ICD-10-CM | POA: Diagnosis not present

## 2019-11-22 DIAGNOSIS — I2699 Other pulmonary embolism without acute cor pulmonale: Secondary | ICD-10-CM | POA: Diagnosis not present

## 2019-11-22 DIAGNOSIS — I120 Hypertensive chronic kidney disease with stage 5 chronic kidney disease or end stage renal disease: Secondary | ICD-10-CM | POA: Diagnosis not present

## 2019-11-22 DIAGNOSIS — R9431 Abnormal electrocardiogram [ECG] [EKG]: Secondary | ICD-10-CM | POA: Diagnosis not present

## 2019-11-22 DIAGNOSIS — N186 End stage renal disease: Secondary | ICD-10-CM | POA: Diagnosis not present

## 2019-11-22 DIAGNOSIS — J9811 Atelectasis: Secondary | ICD-10-CM | POA: Diagnosis not present

## 2019-11-22 DIAGNOSIS — I313 Pericardial effusion (noninflammatory): Secondary | ICD-10-CM | POA: Diagnosis not present

## 2019-11-22 DIAGNOSIS — J90 Pleural effusion, not elsewhere classified: Secondary | ICD-10-CM | POA: Diagnosis not present

## 2019-11-22 DIAGNOSIS — D733 Abscess of spleen: Secondary | ICD-10-CM | POA: Diagnosis not present

## 2019-11-22 DIAGNOSIS — R5383 Other fatigue: Secondary | ICD-10-CM | POA: Diagnosis not present

## 2019-11-22 DIAGNOSIS — Z87448 Personal history of other diseases of urinary system: Secondary | ICD-10-CM | POA: Diagnosis not present

## 2019-11-22 DIAGNOSIS — Z8679 Personal history of other diseases of the circulatory system: Secondary | ICD-10-CM | POA: Diagnosis not present

## 2019-11-22 DIAGNOSIS — R0602 Shortness of breath: Secondary | ICD-10-CM | POA: Diagnosis not present

## 2019-11-22 DIAGNOSIS — Z20822 Contact with and (suspected) exposure to covid-19: Secondary | ICD-10-CM | POA: Diagnosis not present

## 2019-11-22 DIAGNOSIS — I493 Ventricular premature depolarization: Secondary | ICD-10-CM | POA: Diagnosis not present

## 2019-11-22 DIAGNOSIS — I959 Hypotension, unspecified: Secondary | ICD-10-CM | POA: Diagnosis not present

## 2019-11-23 DIAGNOSIS — N186 End stage renal disease: Secondary | ICD-10-CM | POA: Diagnosis not present

## 2019-11-23 DIAGNOSIS — N2581 Secondary hyperparathyroidism of renal origin: Secondary | ICD-10-CM | POA: Diagnosis not present

## 2019-11-23 DIAGNOSIS — D631 Anemia in chronic kidney disease: Secondary | ICD-10-CM | POA: Diagnosis not present

## 2019-11-23 DIAGNOSIS — D509 Iron deficiency anemia, unspecified: Secondary | ICD-10-CM | POA: Diagnosis not present

## 2019-11-24 DIAGNOSIS — E785 Hyperlipidemia, unspecified: Secondary | ICD-10-CM | POA: Diagnosis not present

## 2019-11-24 DIAGNOSIS — S22081A Stable burst fracture of T11-T12 vertebra, initial encounter for closed fracture: Secondary | ICD-10-CM | POA: Diagnosis not present

## 2019-11-24 DIAGNOSIS — I252 Old myocardial infarction: Secondary | ICD-10-CM | POA: Diagnosis not present

## 2019-11-24 DIAGNOSIS — R5383 Other fatigue: Secondary | ICD-10-CM | POA: Diagnosis not present

## 2019-11-24 DIAGNOSIS — E8770 Fluid overload, unspecified: Secondary | ICD-10-CM | POA: Diagnosis not present

## 2019-11-24 DIAGNOSIS — I313 Pericardial effusion (noninflammatory): Secondary | ICD-10-CM | POA: Diagnosis present

## 2019-11-24 DIAGNOSIS — R279 Unspecified lack of coordination: Secondary | ICD-10-CM | POA: Diagnosis not present

## 2019-11-24 DIAGNOSIS — R0602 Shortness of breath: Secondary | ICD-10-CM | POA: Diagnosis not present

## 2019-11-24 DIAGNOSIS — I10 Essential (primary) hypertension: Secondary | ICD-10-CM | POA: Diagnosis not present

## 2019-11-24 DIAGNOSIS — I2782 Chronic pulmonary embolism: Secondary | ICD-10-CM | POA: Diagnosis present

## 2019-11-24 DIAGNOSIS — Z8781 Personal history of (healed) traumatic fracture: Secondary | ICD-10-CM | POA: Diagnosis not present

## 2019-11-24 DIAGNOSIS — S82301K Unspecified fracture of lower end of right tibia, subsequent encounter for closed fracture with nonunion: Secondary | ICD-10-CM | POA: Diagnosis not present

## 2019-11-24 DIAGNOSIS — I70208 Unspecified atherosclerosis of native arteries of extremities, other extremity: Secondary | ICD-10-CM | POA: Diagnosis not present

## 2019-11-24 DIAGNOSIS — R5381 Other malaise: Secondary | ICD-10-CM | POA: Diagnosis not present

## 2019-11-24 DIAGNOSIS — S22078A Other fracture of T9-T10 vertebra, initial encounter for closed fracture: Secondary | ICD-10-CM | POA: Diagnosis not present

## 2019-11-24 DIAGNOSIS — D7389 Other diseases of spleen: Secondary | ICD-10-CM | POA: Diagnosis not present

## 2019-11-24 DIAGNOSIS — S22009A Unspecified fracture of unspecified thoracic vertebra, initial encounter for closed fracture: Secondary | ICD-10-CM | POA: Diagnosis present

## 2019-11-24 DIAGNOSIS — I2699 Other pulmonary embolism without acute cor pulmonale: Secondary | ICD-10-CM | POA: Diagnosis present

## 2019-11-24 DIAGNOSIS — R791 Abnormal coagulation profile: Secondary | ICD-10-CM | POA: Diagnosis not present

## 2019-11-24 DIAGNOSIS — I493 Ventricular premature depolarization: Secondary | ICD-10-CM | POA: Diagnosis not present

## 2019-11-24 DIAGNOSIS — T84116A Breakdown (mechanical) of internal fixation device of bone of right lower leg, initial encounter: Secondary | ICD-10-CM | POA: Diagnosis not present

## 2019-11-24 DIAGNOSIS — S22079G Unspecified fracture of T9-T10 vertebra, subsequent encounter for fracture with delayed healing: Secondary | ICD-10-CM | POA: Diagnosis not present

## 2019-11-24 DIAGNOSIS — R9431 Abnormal electrocardiogram [ECG] [EKG]: Secondary | ICD-10-CM | POA: Diagnosis not present

## 2019-11-24 DIAGNOSIS — Z7901 Long term (current) use of anticoagulants: Secondary | ICD-10-CM | POA: Diagnosis not present

## 2019-11-24 DIAGNOSIS — Z7902 Long term (current) use of antithrombotics/antiplatelets: Secondary | ICD-10-CM | POA: Diagnosis not present

## 2019-11-24 DIAGNOSIS — R4701 Aphasia: Secondary | ICD-10-CM | POA: Diagnosis not present

## 2019-11-24 DIAGNOSIS — M7989 Other specified soft tissue disorders: Secondary | ICD-10-CM | POA: Diagnosis not present

## 2019-11-24 DIAGNOSIS — E119 Type 2 diabetes mellitus without complications: Secondary | ICD-10-CM | POA: Diagnosis not present

## 2019-11-24 DIAGNOSIS — I959 Hypotension, unspecified: Secondary | ICD-10-CM | POA: Diagnosis present

## 2019-11-24 DIAGNOSIS — Z86711 Personal history of pulmonary embolism: Secondary | ICD-10-CM | POA: Diagnosis not present

## 2019-11-24 DIAGNOSIS — R600 Localized edema: Secondary | ICD-10-CM | POA: Diagnosis not present

## 2019-11-24 DIAGNOSIS — I120 Hypertensive chronic kidney disease with stage 5 chronic kidney disease or end stage renal disease: Secondary | ICD-10-CM | POA: Diagnosis present

## 2019-11-24 DIAGNOSIS — Z794 Long term (current) use of insulin: Secondary | ICD-10-CM | POA: Diagnosis not present

## 2019-11-24 DIAGNOSIS — D733 Abscess of spleen: Secondary | ICD-10-CM | POA: Diagnosis not present

## 2019-11-24 DIAGNOSIS — S22009D Unspecified fracture of unspecified thoracic vertebra, subsequent encounter for fracture with routine healing: Secondary | ICD-10-CM | POA: Diagnosis not present

## 2019-11-24 DIAGNOSIS — M6281 Muscle weakness (generalized): Secondary | ICD-10-CM | POA: Diagnosis not present

## 2019-11-24 DIAGNOSIS — Q211 Atrial septal defect: Secondary | ICD-10-CM | POA: Diagnosis not present

## 2019-11-24 DIAGNOSIS — S82831D Other fracture of upper and lower end of right fibula, subsequent encounter for closed fracture with routine healing: Secondary | ICD-10-CM | POA: Diagnosis not present

## 2019-11-24 DIAGNOSIS — M4844XG Fatigue fracture of vertebra, thoracic region, subsequent encounter for fracture with delayed healing: Secondary | ICD-10-CM | POA: Diagnosis not present

## 2019-11-24 DIAGNOSIS — R0689 Other abnormalities of breathing: Secondary | ICD-10-CM | POA: Diagnosis present

## 2019-11-24 DIAGNOSIS — J449 Chronic obstructive pulmonary disease, unspecified: Secondary | ICD-10-CM | POA: Diagnosis present

## 2019-11-24 DIAGNOSIS — Z79899 Other long term (current) drug therapy: Secondary | ICD-10-CM | POA: Diagnosis not present

## 2019-11-24 DIAGNOSIS — Z8673 Personal history of transient ischemic attack (TIA), and cerebral infarction without residual deficits: Secondary | ICD-10-CM | POA: Diagnosis not present

## 2019-11-24 DIAGNOSIS — G9340 Encephalopathy, unspecified: Secondary | ICD-10-CM | POA: Diagnosis not present

## 2019-11-24 DIAGNOSIS — E1122 Type 2 diabetes mellitus with diabetic chronic kidney disease: Secondary | ICD-10-CM | POA: Diagnosis present

## 2019-11-24 DIAGNOSIS — I498 Other specified cardiac arrhythmias: Secondary | ICD-10-CM | POA: Diagnosis not present

## 2019-11-24 DIAGNOSIS — E871 Hypo-osmolality and hyponatremia: Secondary | ICD-10-CM | POA: Diagnosis not present

## 2019-11-24 DIAGNOSIS — G4733 Obstructive sleep apnea (adult) (pediatric): Secondary | ICD-10-CM | POA: Diagnosis not present

## 2019-11-24 DIAGNOSIS — R2681 Unsteadiness on feet: Secondary | ICD-10-CM | POA: Diagnosis not present

## 2019-11-24 DIAGNOSIS — R269 Unspecified abnormalities of gait and mobility: Secondary | ICD-10-CM | POA: Diagnosis not present

## 2019-11-24 DIAGNOSIS — D7889 Other postprocedural complications of the spleen: Secondary | ICD-10-CM | POA: Diagnosis not present

## 2019-11-24 DIAGNOSIS — Z720 Tobacco use: Secondary | ICD-10-CM | POA: Diagnosis not present

## 2019-11-24 DIAGNOSIS — Z743 Need for continuous supervision: Secondary | ICD-10-CM | POA: Diagnosis not present

## 2019-11-24 DIAGNOSIS — Z992 Dependence on renal dialysis: Secondary | ICD-10-CM | POA: Diagnosis not present

## 2019-11-24 DIAGNOSIS — D631 Anemia in chronic kidney disease: Secondary | ICD-10-CM | POA: Diagnosis not present

## 2019-11-24 DIAGNOSIS — N186 End stage renal disease: Secondary | ICD-10-CM | POA: Diagnosis present

## 2019-11-24 DIAGNOSIS — Z20822 Contact with and (suspected) exposure to covid-19: Secondary | ICD-10-CM | POA: Diagnosis present

## 2019-12-02 DIAGNOSIS — E1165 Type 2 diabetes mellitus with hyperglycemia: Secondary | ICD-10-CM | POA: Diagnosis not present

## 2019-12-02 DIAGNOSIS — R471 Dysarthria and anarthria: Secondary | ICD-10-CM | POA: Diagnosis present

## 2019-12-02 DIAGNOSIS — E119 Type 2 diabetes mellitus without complications: Secondary | ICD-10-CM | POA: Diagnosis not present

## 2019-12-02 DIAGNOSIS — Z23 Encounter for immunization: Secondary | ICD-10-CM | POA: Diagnosis not present

## 2019-12-02 DIAGNOSIS — M3212 Pericarditis in systemic lupus erythematosus: Secondary | ICD-10-CM | POA: Diagnosis not present

## 2019-12-02 DIAGNOSIS — R531 Weakness: Secondary | ICD-10-CM | POA: Diagnosis not present

## 2019-12-02 DIAGNOSIS — D631 Anemia in chronic kidney disease: Secondary | ICD-10-CM | POA: Diagnosis not present

## 2019-12-02 DIAGNOSIS — R52 Pain, unspecified: Secondary | ICD-10-CM | POA: Diagnosis not present

## 2019-12-02 DIAGNOSIS — I32 Pericarditis in diseases classified elsewhere: Secondary | ICD-10-CM | POA: Diagnosis not present

## 2019-12-02 DIAGNOSIS — F419 Anxiety disorder, unspecified: Secondary | ICD-10-CM | POA: Diagnosis not present

## 2019-12-02 DIAGNOSIS — I70208 Unspecified atherosclerosis of native arteries of extremities, other extremity: Secondary | ICD-10-CM | POA: Diagnosis not present

## 2019-12-02 DIAGNOSIS — G459 Transient cerebral ischemic attack, unspecified: Secondary | ICD-10-CM | POA: Diagnosis not present

## 2019-12-02 DIAGNOSIS — J9811 Atelectasis: Secondary | ICD-10-CM | POA: Diagnosis not present

## 2019-12-02 DIAGNOSIS — Z7951 Long term (current) use of inhaled steroids: Secondary | ICD-10-CM | POA: Diagnosis not present

## 2019-12-02 DIAGNOSIS — Z7401 Bed confinement status: Secondary | ICD-10-CM | POA: Diagnosis not present

## 2019-12-02 DIAGNOSIS — I739 Peripheral vascular disease, unspecified: Secondary | ICD-10-CM | POA: Diagnosis not present

## 2019-12-02 DIAGNOSIS — G9340 Encephalopathy, unspecified: Secondary | ICD-10-CM | POA: Diagnosis not present

## 2019-12-02 DIAGNOSIS — F339 Major depressive disorder, recurrent, unspecified: Secondary | ICD-10-CM | POA: Diagnosis not present

## 2019-12-02 DIAGNOSIS — R0602 Shortness of breath: Secondary | ICD-10-CM | POA: Diagnosis not present

## 2019-12-02 DIAGNOSIS — R609 Edema, unspecified: Secondary | ICD-10-CM | POA: Diagnosis not present

## 2019-12-02 DIAGNOSIS — Z8673 Personal history of transient ischemic attack (TIA), and cerebral infarction without residual deficits: Secondary | ICD-10-CM | POA: Diagnosis not present

## 2019-12-02 DIAGNOSIS — Z7902 Long term (current) use of antithrombotics/antiplatelets: Secondary | ICD-10-CM | POA: Diagnosis not present

## 2019-12-02 DIAGNOSIS — M2550 Pain in unspecified joint: Secondary | ICD-10-CM | POA: Diagnosis not present

## 2019-12-02 DIAGNOSIS — R4182 Altered mental status, unspecified: Secondary | ICD-10-CM | POA: Diagnosis not present

## 2019-12-02 DIAGNOSIS — R269 Unspecified abnormalities of gait and mobility: Secondary | ICD-10-CM | POA: Diagnosis not present

## 2019-12-02 DIAGNOSIS — Z86718 Personal history of other venous thrombosis and embolism: Secondary | ICD-10-CM | POA: Diagnosis not present

## 2019-12-02 DIAGNOSIS — Z5181 Encounter for therapeutic drug level monitoring: Secondary | ICD-10-CM | POA: Diagnosis not present

## 2019-12-02 DIAGNOSIS — R5381 Other malaise: Secondary | ICD-10-CM | POA: Diagnosis not present

## 2019-12-02 DIAGNOSIS — D733 Abscess of spleen: Secondary | ICD-10-CM | POA: Diagnosis not present

## 2019-12-02 DIAGNOSIS — J90 Pleural effusion, not elsewhere classified: Secondary | ICD-10-CM | POA: Diagnosis not present

## 2019-12-02 DIAGNOSIS — I10 Essential (primary) hypertension: Secondary | ICD-10-CM | POA: Diagnosis not present

## 2019-12-02 DIAGNOSIS — Z7901 Long term (current) use of anticoagulants: Secondary | ICD-10-CM | POA: Diagnosis not present

## 2019-12-02 DIAGNOSIS — S22009A Unspecified fracture of unspecified thoracic vertebra, initial encounter for closed fracture: Secondary | ICD-10-CM | POA: Diagnosis not present

## 2019-12-02 DIAGNOSIS — D7889 Other postprocedural complications of the spleen: Secondary | ICD-10-CM | POA: Diagnosis not present

## 2019-12-02 DIAGNOSIS — R600 Localized edema: Secondary | ICD-10-CM | POA: Diagnosis not present

## 2019-12-02 DIAGNOSIS — S32009A Unspecified fracture of unspecified lumbar vertebra, initial encounter for closed fracture: Secondary | ICD-10-CM | POA: Diagnosis not present

## 2019-12-02 DIAGNOSIS — I82431 Acute embolism and thrombosis of right popliteal vein: Secondary | ICD-10-CM | POA: Diagnosis present

## 2019-12-02 DIAGNOSIS — M79603 Pain in arm, unspecified: Secondary | ICD-10-CM | POA: Diagnosis not present

## 2019-12-02 DIAGNOSIS — J45909 Unspecified asthma, uncomplicated: Secondary | ICD-10-CM | POA: Diagnosis not present

## 2019-12-02 DIAGNOSIS — R791 Abnormal coagulation profile: Secondary | ICD-10-CM | POA: Diagnosis present

## 2019-12-02 DIAGNOSIS — Z794 Long term (current) use of insulin: Secondary | ICD-10-CM | POA: Diagnosis not present

## 2019-12-02 DIAGNOSIS — Z86711 Personal history of pulmonary embolism: Secondary | ICD-10-CM | POA: Diagnosis not present

## 2019-12-02 DIAGNOSIS — I82411 Acute embolism and thrombosis of right femoral vein: Secondary | ICD-10-CM | POA: Diagnosis present

## 2019-12-02 DIAGNOSIS — M6281 Muscle weakness (generalized): Secondary | ICD-10-CM | POA: Diagnosis not present

## 2019-12-02 DIAGNOSIS — I2782 Chronic pulmonary embolism: Secondary | ICD-10-CM | POA: Diagnosis present

## 2019-12-02 DIAGNOSIS — Z743 Need for continuous supervision: Secondary | ICD-10-CM | POA: Diagnosis not present

## 2019-12-02 DIAGNOSIS — I6350 Cerebral infarction due to unspecified occlusion or stenosis of unspecified cerebral artery: Secondary | ICD-10-CM | POA: Diagnosis not present

## 2019-12-02 DIAGNOSIS — I742 Embolism and thrombosis of arteries of the upper extremities: Secondary | ICD-10-CM | POA: Diagnosis not present

## 2019-12-02 DIAGNOSIS — Z87891 Personal history of nicotine dependence: Secondary | ICD-10-CM | POA: Diagnosis not present

## 2019-12-02 DIAGNOSIS — R29700 NIHSS score 0: Secondary | ICD-10-CM | POA: Diagnosis present

## 2019-12-02 DIAGNOSIS — N2581 Secondary hyperparathyroidism of renal origin: Secondary | ICD-10-CM | POA: Diagnosis not present

## 2019-12-02 DIAGNOSIS — Z992 Dependence on renal dialysis: Secondary | ICD-10-CM | POA: Diagnosis not present

## 2019-12-02 DIAGNOSIS — R279 Unspecified lack of coordination: Secondary | ICD-10-CM | POA: Diagnosis not present

## 2019-12-02 DIAGNOSIS — I120 Hypertensive chronic kidney disease with stage 5 chronic kidney disease or end stage renal disease: Secondary | ICD-10-CM | POA: Diagnosis present

## 2019-12-02 DIAGNOSIS — I749 Embolism and thrombosis of unspecified artery: Secondary | ICD-10-CM | POA: Diagnosis present

## 2019-12-02 DIAGNOSIS — M25511 Pain in right shoulder: Secondary | ICD-10-CM | POA: Diagnosis not present

## 2019-12-02 DIAGNOSIS — E1140 Type 2 diabetes mellitus with diabetic neuropathy, unspecified: Secondary | ICD-10-CM | POA: Diagnosis not present

## 2019-12-02 DIAGNOSIS — T50901A Poisoning by unspecified drugs, medicaments and biological substances, accidental (unintentional), initial encounter: Secondary | ICD-10-CM | POA: Diagnosis not present

## 2019-12-02 DIAGNOSIS — R4701 Aphasia: Secondary | ICD-10-CM | POA: Diagnosis not present

## 2019-12-02 DIAGNOSIS — N186 End stage renal disease: Secondary | ICD-10-CM | POA: Diagnosis present

## 2019-12-02 DIAGNOSIS — Z049 Encounter for examination and observation for unspecified reason: Secondary | ICD-10-CM | POA: Diagnosis not present

## 2019-12-02 DIAGNOSIS — D899 Disorder involving the immune mechanism, unspecified: Secondary | ICD-10-CM | POA: Diagnosis not present

## 2019-12-02 DIAGNOSIS — E1122 Type 2 diabetes mellitus with diabetic chronic kidney disease: Secondary | ICD-10-CM | POA: Diagnosis not present

## 2019-12-02 DIAGNOSIS — M19011 Primary osteoarthritis, right shoulder: Secondary | ICD-10-CM | POA: Diagnosis not present

## 2019-12-02 DIAGNOSIS — Z79899 Other long term (current) drug therapy: Secondary | ICD-10-CM | POA: Diagnosis not present

## 2019-12-02 DIAGNOSIS — R2681 Unsteadiness on feet: Secondary | ICD-10-CM | POA: Diagnosis not present

## 2019-12-02 DIAGNOSIS — M4844XG Fatigue fracture of vertebra, thoracic region, subsequent encounter for fracture with delayed healing: Secondary | ICD-10-CM | POA: Diagnosis not present

## 2019-12-02 DIAGNOSIS — M546 Pain in thoracic spine: Secondary | ICD-10-CM | POA: Diagnosis not present

## 2019-12-02 DIAGNOSIS — R079 Chest pain, unspecified: Secondary | ICD-10-CM | POA: Diagnosis not present

## 2019-12-02 DIAGNOSIS — S22008A Other fracture of unspecified thoracic vertebra, initial encounter for closed fracture: Secondary | ICD-10-CM | POA: Diagnosis not present

## 2019-12-02 DIAGNOSIS — I2609 Other pulmonary embolism with acute cor pulmonale: Secondary | ICD-10-CM | POA: Diagnosis not present

## 2019-12-02 DIAGNOSIS — Z418 Encounter for other procedures for purposes other than remedying health state: Secondary | ICD-10-CM | POA: Diagnosis not present

## 2019-12-02 DIAGNOSIS — I744 Embolism and thrombosis of arteries of extremities, unspecified: Secondary | ICD-10-CM | POA: Diagnosis not present

## 2019-12-02 DIAGNOSIS — G4733 Obstructive sleep apnea (adult) (pediatric): Secondary | ICD-10-CM | POA: Diagnosis not present

## 2019-12-02 DIAGNOSIS — I639 Cerebral infarction, unspecified: Secondary | ICD-10-CM | POA: Diagnosis present

## 2019-12-02 DIAGNOSIS — I509 Heart failure, unspecified: Secondary | ICD-10-CM | POA: Diagnosis not present

## 2019-12-02 DIAGNOSIS — D509 Iron deficiency anemia, unspecified: Secondary | ICD-10-CM | POA: Diagnosis not present

## 2019-12-02 DIAGNOSIS — K219 Gastro-esophageal reflux disease without esophagitis: Secondary | ICD-10-CM | POA: Diagnosis not present

## 2019-12-02 DIAGNOSIS — Z20822 Contact with and (suspected) exposure to covid-19: Secondary | ICD-10-CM | POA: Diagnosis present

## 2019-12-02 DIAGNOSIS — I959 Hypotension, unspecified: Secondary | ICD-10-CM | POA: Diagnosis not present

## 2019-12-02 DIAGNOSIS — R9431 Abnormal electrocardiogram [ECG] [EKG]: Secondary | ICD-10-CM | POA: Diagnosis not present

## 2019-12-02 DIAGNOSIS — R0902 Hypoxemia: Secondary | ICD-10-CM | POA: Diagnosis not present

## 2019-12-02 DIAGNOSIS — T8249XD Other complication of vascular dialysis catheter, subsequent encounter: Secondary | ICD-10-CM | POA: Diagnosis not present

## 2019-12-02 DIAGNOSIS — J449 Chronic obstructive pulmonary disease, unspecified: Secondary | ICD-10-CM | POA: Diagnosis not present

## 2019-12-02 DIAGNOSIS — Q211 Atrial septal defect: Secondary | ICD-10-CM | POA: Diagnosis not present

## 2019-12-02 DIAGNOSIS — I313 Pericardial effusion (noninflammatory): Secondary | ICD-10-CM | POA: Diagnosis not present

## 2019-12-02 DIAGNOSIS — G8929 Other chronic pain: Secondary | ICD-10-CM | POA: Diagnosis not present

## 2019-12-02 DIAGNOSIS — F329 Major depressive disorder, single episode, unspecified: Secondary | ICD-10-CM | POA: Diagnosis not present

## 2019-12-02 DIAGNOSIS — S22009D Unspecified fracture of unspecified thoracic vertebra, subsequent encounter for fracture with routine healing: Secondary | ICD-10-CM | POA: Diagnosis not present

## 2019-12-02 DIAGNOSIS — I480 Paroxysmal atrial fibrillation: Secondary | ICD-10-CM | POA: Diagnosis not present

## 2019-12-02 DIAGNOSIS — I482 Chronic atrial fibrillation, unspecified: Secondary | ICD-10-CM | POA: Diagnosis not present

## 2019-12-02 DIAGNOSIS — S8291XA Unspecified fracture of right lower leg, initial encounter for closed fracture: Secondary | ICD-10-CM | POA: Diagnosis not present

## 2019-12-02 DIAGNOSIS — I2699 Other pulmonary embolism without acute cor pulmonale: Secondary | ICD-10-CM | POA: Diagnosis not present

## 2019-12-02 DIAGNOSIS — I4891 Unspecified atrial fibrillation: Secondary | ICD-10-CM | POA: Diagnosis not present

## 2019-12-02 LAB — BASIC METABOLIC PANEL
BUN: 30 — AB (ref 4–21)
CO2: 25 — AB (ref 13–22)
Chloride: 102 (ref 99–108)
Creatinine: 4.8 — AB (ref 0.6–1.3)
Glucose: 191
Potassium: 4.7 (ref 3.4–5.3)
Sodium: 135 — AB (ref 137–147)

## 2019-12-02 LAB — COMPREHENSIVE METABOLIC PANEL
Calcium: 8.6 — AB (ref 8.7–10.7)
GFR calc non Af Amer: 12

## 2019-12-02 LAB — CBC AND DIFFERENTIAL
HCT: 25 — AB (ref 41–53)
Hemoglobin: 8 — AB (ref 13.5–17.5)
Platelets: 209 (ref 150–399)
WBC: 5

## 2019-12-02 LAB — NOVEL CORONAVIRUS, NAA: SARS-CoV-2, NAA: NEGATIVE

## 2019-12-02 LAB — CBC: RBC: 2.9 — AB (ref 3.87–5.11)

## 2019-12-04 DIAGNOSIS — S8291XA Unspecified fracture of right lower leg, initial encounter for closed fracture: Secondary | ICD-10-CM | POA: Diagnosis not present

## 2019-12-04 DIAGNOSIS — D733 Abscess of spleen: Secondary | ICD-10-CM | POA: Diagnosis not present

## 2019-12-04 DIAGNOSIS — I959 Hypotension, unspecified: Secondary | ICD-10-CM | POA: Diagnosis not present

## 2019-12-04 DIAGNOSIS — N2581 Secondary hyperparathyroidism of renal origin: Secondary | ICD-10-CM | POA: Diagnosis not present

## 2019-12-04 DIAGNOSIS — I4891 Unspecified atrial fibrillation: Secondary | ICD-10-CM | POA: Diagnosis not present

## 2019-12-04 DIAGNOSIS — N186 End stage renal disease: Secondary | ICD-10-CM | POA: Diagnosis not present

## 2019-12-04 DIAGNOSIS — I2609 Other pulmonary embolism with acute cor pulmonale: Secondary | ICD-10-CM | POA: Diagnosis not present

## 2019-12-04 DIAGNOSIS — S32009A Unspecified fracture of unspecified lumbar vertebra, initial encounter for closed fracture: Secondary | ICD-10-CM | POA: Diagnosis not present

## 2019-12-04 DIAGNOSIS — M3212 Pericarditis in systemic lupus erythematosus: Secondary | ICD-10-CM | POA: Diagnosis not present

## 2019-12-04 DIAGNOSIS — D631 Anemia in chronic kidney disease: Secondary | ICD-10-CM | POA: Diagnosis not present

## 2019-12-04 DIAGNOSIS — I10 Essential (primary) hypertension: Secondary | ICD-10-CM | POA: Diagnosis not present

## 2019-12-04 DIAGNOSIS — R791 Abnormal coagulation profile: Secondary | ICD-10-CM | POA: Diagnosis not present

## 2019-12-04 DIAGNOSIS — J449 Chronic obstructive pulmonary disease, unspecified: Secondary | ICD-10-CM | POA: Diagnosis not present

## 2019-12-04 DIAGNOSIS — D509 Iron deficiency anemia, unspecified: Secondary | ICD-10-CM | POA: Diagnosis not present

## 2019-12-04 DIAGNOSIS — R4182 Altered mental status, unspecified: Secondary | ICD-10-CM | POA: Diagnosis not present

## 2019-12-06 DIAGNOSIS — N186 End stage renal disease: Secondary | ICD-10-CM | POA: Diagnosis not present

## 2019-12-06 DIAGNOSIS — Z992 Dependence on renal dialysis: Secondary | ICD-10-CM | POA: Diagnosis not present

## 2019-12-08 DIAGNOSIS — R609 Edema, unspecified: Secondary | ICD-10-CM | POA: Diagnosis not present

## 2019-12-08 DIAGNOSIS — S32009A Unspecified fracture of unspecified lumbar vertebra, initial encounter for closed fracture: Secondary | ICD-10-CM | POA: Diagnosis not present

## 2019-12-08 DIAGNOSIS — E119 Type 2 diabetes mellitus without complications: Secondary | ICD-10-CM | POA: Diagnosis not present

## 2019-12-08 DIAGNOSIS — N186 End stage renal disease: Secondary | ICD-10-CM | POA: Diagnosis not present

## 2019-12-08 DIAGNOSIS — R4182 Altered mental status, unspecified: Secondary | ICD-10-CM | POA: Diagnosis not present

## 2019-12-08 DIAGNOSIS — S8291XA Unspecified fracture of right lower leg, initial encounter for closed fracture: Secondary | ICD-10-CM | POA: Diagnosis not present

## 2019-12-08 DIAGNOSIS — R791 Abnormal coagulation profile: Secondary | ICD-10-CM | POA: Diagnosis not present

## 2019-12-08 DIAGNOSIS — D733 Abscess of spleen: Secondary | ICD-10-CM | POA: Diagnosis not present

## 2019-12-08 DIAGNOSIS — M19011 Primary osteoarthritis, right shoulder: Secondary | ICD-10-CM | POA: Diagnosis not present

## 2019-12-08 DIAGNOSIS — I10 Essential (primary) hypertension: Secondary | ICD-10-CM | POA: Diagnosis not present

## 2019-12-08 DIAGNOSIS — J449 Chronic obstructive pulmonary disease, unspecified: Secondary | ICD-10-CM | POA: Diagnosis not present

## 2019-12-08 DIAGNOSIS — I4891 Unspecified atrial fibrillation: Secondary | ICD-10-CM | POA: Diagnosis not present

## 2019-12-08 DIAGNOSIS — I2609 Other pulmonary embolism with acute cor pulmonale: Secondary | ICD-10-CM | POA: Diagnosis not present

## 2019-12-08 DIAGNOSIS — I959 Hypotension, unspecified: Secondary | ICD-10-CM | POA: Diagnosis not present

## 2019-12-08 DIAGNOSIS — Z992 Dependence on renal dialysis: Secondary | ICD-10-CM | POA: Diagnosis not present

## 2019-12-08 DIAGNOSIS — M25511 Pain in right shoulder: Secondary | ICD-10-CM | POA: Diagnosis not present

## 2019-12-09 DIAGNOSIS — D509 Iron deficiency anemia, unspecified: Secondary | ICD-10-CM | POA: Diagnosis not present

## 2019-12-09 DIAGNOSIS — N186 End stage renal disease: Secondary | ICD-10-CM | POA: Diagnosis not present

## 2019-12-09 DIAGNOSIS — Z23 Encounter for immunization: Secondary | ICD-10-CM | POA: Diagnosis not present

## 2019-12-09 DIAGNOSIS — Z418 Encounter for other procedures for purposes other than remedying health state: Secondary | ICD-10-CM | POA: Diagnosis not present

## 2019-12-11 ENCOUNTER — Encounter: Payer: Self-pay | Admitting: Family Medicine

## 2019-12-14 DIAGNOSIS — E1140 Type 2 diabetes mellitus with diabetic neuropathy, unspecified: Secondary | ICD-10-CM | POA: Diagnosis not present

## 2019-12-14 DIAGNOSIS — S22008A Other fracture of unspecified thoracic vertebra, initial encounter for closed fracture: Secondary | ICD-10-CM | POA: Diagnosis not present

## 2019-12-14 DIAGNOSIS — Z86718 Personal history of other venous thrombosis and embolism: Secondary | ICD-10-CM | POA: Diagnosis not present

## 2019-12-14 DIAGNOSIS — J90 Pleural effusion, not elsewhere classified: Secondary | ICD-10-CM | POA: Diagnosis not present

## 2019-12-14 DIAGNOSIS — I4891 Unspecified atrial fibrillation: Secondary | ICD-10-CM | POA: Diagnosis not present

## 2019-12-14 DIAGNOSIS — J449 Chronic obstructive pulmonary disease, unspecified: Secondary | ICD-10-CM | POA: Diagnosis not present

## 2019-12-14 DIAGNOSIS — I639 Cerebral infarction, unspecified: Secondary | ICD-10-CM | POA: Diagnosis not present

## 2019-12-14 DIAGNOSIS — N186 End stage renal disease: Secondary | ICD-10-CM | POA: Diagnosis not present

## 2019-12-14 DIAGNOSIS — I2609 Other pulmonary embolism with acute cor pulmonale: Secondary | ICD-10-CM | POA: Diagnosis not present

## 2019-12-14 DIAGNOSIS — I10 Essential (primary) hypertension: Secondary | ICD-10-CM | POA: Diagnosis not present

## 2019-12-14 DIAGNOSIS — I120 Hypertensive chronic kidney disease with stage 5 chronic kidney disease or end stage renal disease: Secondary | ICD-10-CM | POA: Diagnosis not present

## 2019-12-14 DIAGNOSIS — D509 Iron deficiency anemia, unspecified: Secondary | ICD-10-CM | POA: Diagnosis not present

## 2019-12-14 DIAGNOSIS — R0602 Shortness of breath: Secondary | ICD-10-CM | POA: Diagnosis not present

## 2019-12-14 DIAGNOSIS — R079 Chest pain, unspecified: Secondary | ICD-10-CM | POA: Diagnosis not present

## 2019-12-14 DIAGNOSIS — Z23 Encounter for immunization: Secondary | ICD-10-CM | POA: Diagnosis not present

## 2019-12-14 DIAGNOSIS — F339 Major depressive disorder, recurrent, unspecified: Secondary | ICD-10-CM | POA: Diagnosis not present

## 2019-12-14 DIAGNOSIS — Z418 Encounter for other procedures for purposes other than remedying health state: Secondary | ICD-10-CM | POA: Diagnosis not present

## 2019-12-14 DIAGNOSIS — J9811 Atelectasis: Secondary | ICD-10-CM | POA: Diagnosis not present

## 2019-12-14 DIAGNOSIS — I32 Pericarditis in diseases classified elsewhere: Secondary | ICD-10-CM | POA: Diagnosis not present

## 2019-12-14 DIAGNOSIS — E1122 Type 2 diabetes mellitus with diabetic chronic kidney disease: Secondary | ICD-10-CM | POA: Diagnosis not present

## 2019-12-14 DIAGNOSIS — D733 Abscess of spleen: Secondary | ICD-10-CM | POA: Diagnosis not present

## 2019-12-15 DIAGNOSIS — Z7901 Long term (current) use of anticoagulants: Secondary | ICD-10-CM | POA: Diagnosis not present

## 2019-12-15 DIAGNOSIS — Z992 Dependence on renal dialysis: Secondary | ICD-10-CM | POA: Diagnosis not present

## 2019-12-15 DIAGNOSIS — E1122 Type 2 diabetes mellitus with diabetic chronic kidney disease: Secondary | ICD-10-CM | POA: Diagnosis not present

## 2019-12-15 DIAGNOSIS — J449 Chronic obstructive pulmonary disease, unspecified: Secondary | ICD-10-CM | POA: Diagnosis not present

## 2019-12-15 DIAGNOSIS — I2782 Chronic pulmonary embolism: Secondary | ICD-10-CM | POA: Diagnosis not present

## 2019-12-15 DIAGNOSIS — N186 End stage renal disease: Secondary | ICD-10-CM | POA: Diagnosis not present

## 2019-12-15 DIAGNOSIS — I120 Hypertensive chronic kidney disease with stage 5 chronic kidney disease or end stage renal disease: Secondary | ICD-10-CM | POA: Diagnosis not present

## 2019-12-15 DIAGNOSIS — Z79899 Other long term (current) drug therapy: Secondary | ICD-10-CM | POA: Diagnosis not present

## 2019-12-16 MED ORDER — THIAMINE HCL 100 MG PO TABS
100.00 | ORAL_TABLET | ORAL | Status: DC
Start: 2019-12-16 — End: 2019-12-16

## 2019-12-16 MED ORDER — WARFARIN SODIUM 5 MG PO TABS
5.00 | ORAL_TABLET | ORAL | Status: DC
Start: 2019-12-15 — End: 2019-12-16

## 2019-12-16 MED ORDER — INSULIN LISPRO 100 UNIT/ML ~~LOC~~ SOLN
1.00 | SUBCUTANEOUS | Status: DC
Start: 2019-12-15 — End: 2019-12-16

## 2019-12-16 MED ORDER — MELATONIN 3 MG PO TABS
3.00 | ORAL_TABLET | ORAL | Status: DC
Start: ? — End: 2019-12-16

## 2019-12-16 MED ORDER — BUDESONIDE-FORMOTEROL FUMARATE 160-4.5 MCG/ACT IN AERO
2.00 | INHALATION_SPRAY | RESPIRATORY_TRACT | Status: DC
Start: 2019-12-16 — End: 2019-12-16

## 2019-12-16 MED ORDER — PANTOPRAZOLE SODIUM 40 MG PO TBEC
40.00 | DELAYED_RELEASE_TABLET | ORAL | Status: DC
Start: 2019-12-16 — End: 2019-12-16

## 2019-12-16 MED ORDER — GENERIC EXTERNAL MEDICATION
3.00 | Status: DC
Start: ? — End: 2019-12-16

## 2019-12-16 MED ORDER — METOPROLOL SUCCINATE ER 25 MG PO TB24
25.00 | ORAL_TABLET | ORAL | Status: DC
Start: 2019-12-16 — End: 2019-12-16

## 2019-12-16 MED ORDER — IPRATROPIUM-ALBUTEROL 0.5-2.5 (3) MG/3ML IN SOLN
3.00 | RESPIRATORY_TRACT | Status: DC
Start: ? — End: 2019-12-16

## 2019-12-16 MED ORDER — GLUCOSE 40 % PO GEL
15.00 | ORAL | Status: DC
Start: ? — End: 2019-12-16

## 2019-12-16 MED ORDER — GABAPENTIN 300 MG PO CAPS
300.00 | ORAL_CAPSULE | ORAL | Status: DC
Start: 2019-12-15 — End: 2019-12-16

## 2019-12-16 MED ORDER — CLOPIDOGREL BISULFATE 75 MG PO TABS
75.00 | ORAL_TABLET | ORAL | Status: DC
Start: 2019-12-16 — End: 2019-12-16

## 2019-12-16 MED ORDER — DEXTROSE 10 % IV SOLN
125.00 | INTRAVENOUS | Status: DC
Start: ? — End: 2019-12-16

## 2019-12-16 MED ORDER — MIRTAZAPINE 15 MG PO TBDP
15.00 | ORAL_TABLET | ORAL | Status: DC
Start: 2019-12-15 — End: 2019-12-16

## 2019-12-17 DIAGNOSIS — Z23 Encounter for immunization: Secondary | ICD-10-CM | POA: Diagnosis not present

## 2019-12-17 DIAGNOSIS — N186 End stage renal disease: Secondary | ICD-10-CM | POA: Diagnosis not present

## 2019-12-17 DIAGNOSIS — D509 Iron deficiency anemia, unspecified: Secondary | ICD-10-CM | POA: Diagnosis not present

## 2019-12-17 DIAGNOSIS — Z418 Encounter for other procedures for purposes other than remedying health state: Secondary | ICD-10-CM | POA: Diagnosis not present

## 2019-12-18 DIAGNOSIS — S32009A Unspecified fracture of unspecified lumbar vertebra, initial encounter for closed fracture: Secondary | ICD-10-CM | POA: Diagnosis not present

## 2019-12-18 DIAGNOSIS — R791 Abnormal coagulation profile: Secondary | ICD-10-CM | POA: Diagnosis not present

## 2019-12-18 DIAGNOSIS — I959 Hypotension, unspecified: Secondary | ICD-10-CM | POA: Diagnosis not present

## 2019-12-18 DIAGNOSIS — F339 Major depressive disorder, recurrent, unspecified: Secondary | ICD-10-CM | POA: Diagnosis not present

## 2019-12-18 DIAGNOSIS — N186 End stage renal disease: Secondary | ICD-10-CM | POA: Diagnosis not present

## 2019-12-18 DIAGNOSIS — M6281 Muscle weakness (generalized): Secondary | ICD-10-CM | POA: Diagnosis not present

## 2019-12-18 DIAGNOSIS — I2609 Other pulmonary embolism with acute cor pulmonale: Secondary | ICD-10-CM | POA: Diagnosis not present

## 2019-12-18 DIAGNOSIS — G4733 Obstructive sleep apnea (adult) (pediatric): Secondary | ICD-10-CM | POA: Diagnosis not present

## 2019-12-18 DIAGNOSIS — I32 Pericarditis in diseases classified elsewhere: Secondary | ICD-10-CM | POA: Diagnosis not present

## 2019-12-18 DIAGNOSIS — Z86718 Personal history of other venous thrombosis and embolism: Secondary | ICD-10-CM | POA: Diagnosis not present

## 2019-12-18 DIAGNOSIS — J449 Chronic obstructive pulmonary disease, unspecified: Secondary | ICD-10-CM | POA: Diagnosis not present

## 2019-12-18 DIAGNOSIS — S22008A Other fracture of unspecified thoracic vertebra, initial encounter for closed fracture: Secondary | ICD-10-CM | POA: Diagnosis not present

## 2019-12-21 DIAGNOSIS — Z418 Encounter for other procedures for purposes other than remedying health state: Secondary | ICD-10-CM | POA: Diagnosis not present

## 2019-12-21 DIAGNOSIS — N186 End stage renal disease: Secondary | ICD-10-CM | POA: Diagnosis not present

## 2019-12-21 DIAGNOSIS — D509 Iron deficiency anemia, unspecified: Secondary | ICD-10-CM | POA: Diagnosis not present

## 2019-12-21 DIAGNOSIS — Z23 Encounter for immunization: Secondary | ICD-10-CM | POA: Diagnosis not present

## 2019-12-23 DIAGNOSIS — M6281 Muscle weakness (generalized): Secondary | ICD-10-CM | POA: Diagnosis not present

## 2019-12-23 DIAGNOSIS — G4733 Obstructive sleep apnea (adult) (pediatric): Secondary | ICD-10-CM | POA: Diagnosis not present

## 2019-12-23 DIAGNOSIS — I4891 Unspecified atrial fibrillation: Secondary | ICD-10-CM | POA: Diagnosis not present

## 2019-12-23 DIAGNOSIS — F339 Major depressive disorder, recurrent, unspecified: Secondary | ICD-10-CM | POA: Diagnosis not present

## 2019-12-23 DIAGNOSIS — J449 Chronic obstructive pulmonary disease, unspecified: Secondary | ICD-10-CM | POA: Diagnosis not present

## 2019-12-23 DIAGNOSIS — I10 Essential (primary) hypertension: Secondary | ICD-10-CM | POA: Diagnosis not present

## 2019-12-23 DIAGNOSIS — Z86711 Personal history of pulmonary embolism: Secondary | ICD-10-CM | POA: Diagnosis not present

## 2019-12-23 DIAGNOSIS — I2609 Other pulmonary embolism with acute cor pulmonale: Secondary | ICD-10-CM | POA: Diagnosis not present

## 2019-12-23 DIAGNOSIS — D733 Abscess of spleen: Secondary | ICD-10-CM | POA: Diagnosis not present

## 2019-12-23 DIAGNOSIS — N186 End stage renal disease: Secondary | ICD-10-CM | POA: Diagnosis not present

## 2019-12-23 DIAGNOSIS — Z86718 Personal history of other venous thrombosis and embolism: Secondary | ICD-10-CM | POA: Diagnosis not present

## 2019-12-23 DIAGNOSIS — E1140 Type 2 diabetes mellitus with diabetic neuropathy, unspecified: Secondary | ICD-10-CM | POA: Diagnosis not present

## 2019-12-25 DIAGNOSIS — N186 End stage renal disease: Secondary | ICD-10-CM | POA: Diagnosis not present

## 2019-12-25 DIAGNOSIS — D509 Iron deficiency anemia, unspecified: Secondary | ICD-10-CM | POA: Diagnosis not present

## 2019-12-25 DIAGNOSIS — D733 Abscess of spleen: Secondary | ICD-10-CM | POA: Diagnosis not present

## 2019-12-25 DIAGNOSIS — I4891 Unspecified atrial fibrillation: Secondary | ICD-10-CM | POA: Diagnosis not present

## 2019-12-25 DIAGNOSIS — Z86711 Personal history of pulmonary embolism: Secondary | ICD-10-CM | POA: Diagnosis not present

## 2019-12-25 DIAGNOSIS — Z86718 Personal history of other venous thrombosis and embolism: Secondary | ICD-10-CM | POA: Diagnosis not present

## 2019-12-25 DIAGNOSIS — J449 Chronic obstructive pulmonary disease, unspecified: Secondary | ICD-10-CM | POA: Diagnosis not present

## 2019-12-25 DIAGNOSIS — Z23 Encounter for immunization: Secondary | ICD-10-CM | POA: Diagnosis not present

## 2019-12-25 DIAGNOSIS — I2609 Other pulmonary embolism with acute cor pulmonale: Secondary | ICD-10-CM | POA: Diagnosis not present

## 2019-12-25 DIAGNOSIS — M6281 Muscle weakness (generalized): Secondary | ICD-10-CM | POA: Diagnosis not present

## 2019-12-25 DIAGNOSIS — E1140 Type 2 diabetes mellitus with diabetic neuropathy, unspecified: Secondary | ICD-10-CM | POA: Diagnosis not present

## 2019-12-25 DIAGNOSIS — G4733 Obstructive sleep apnea (adult) (pediatric): Secondary | ICD-10-CM | POA: Diagnosis not present

## 2019-12-25 DIAGNOSIS — I32 Pericarditis in diseases classified elsewhere: Secondary | ICD-10-CM | POA: Diagnosis not present

## 2019-12-25 DIAGNOSIS — Z418 Encounter for other procedures for purposes other than remedying health state: Secondary | ICD-10-CM | POA: Diagnosis not present

## 2019-12-25 DIAGNOSIS — I959 Hypotension, unspecified: Secondary | ICD-10-CM | POA: Diagnosis not present

## 2019-12-29 DIAGNOSIS — I482 Chronic atrial fibrillation, unspecified: Secondary | ICD-10-CM | POA: Diagnosis not present

## 2019-12-29 DIAGNOSIS — I4891 Unspecified atrial fibrillation: Secondary | ICD-10-CM | POA: Diagnosis not present

## 2019-12-29 DIAGNOSIS — M6281 Muscle weakness (generalized): Secondary | ICD-10-CM | POA: Diagnosis not present

## 2019-12-29 DIAGNOSIS — J449 Chronic obstructive pulmonary disease, unspecified: Secondary | ICD-10-CM | POA: Diagnosis not present

## 2019-12-29 DIAGNOSIS — J45909 Unspecified asthma, uncomplicated: Secondary | ICD-10-CM | POA: Diagnosis not present

## 2019-12-29 DIAGNOSIS — Z86711 Personal history of pulmonary embolism: Secondary | ICD-10-CM | POA: Diagnosis not present

## 2019-12-29 DIAGNOSIS — I2609 Other pulmonary embolism with acute cor pulmonale: Secondary | ICD-10-CM | POA: Diagnosis not present

## 2019-12-29 DIAGNOSIS — I480 Paroxysmal atrial fibrillation: Secondary | ICD-10-CM | POA: Diagnosis not present

## 2019-12-29 DIAGNOSIS — S32009A Unspecified fracture of unspecified lumbar vertebra, initial encounter for closed fracture: Secondary | ICD-10-CM | POA: Diagnosis not present

## 2019-12-29 DIAGNOSIS — N186 End stage renal disease: Secondary | ICD-10-CM | POA: Diagnosis not present

## 2019-12-29 DIAGNOSIS — R609 Edema, unspecified: Secondary | ICD-10-CM | POA: Diagnosis not present

## 2019-12-29 DIAGNOSIS — E1140 Type 2 diabetes mellitus with diabetic neuropathy, unspecified: Secondary | ICD-10-CM | POA: Diagnosis not present

## 2019-12-30 DIAGNOSIS — D509 Iron deficiency anemia, unspecified: Secondary | ICD-10-CM | POA: Diagnosis not present

## 2019-12-30 DIAGNOSIS — N186 End stage renal disease: Secondary | ICD-10-CM | POA: Diagnosis not present

## 2019-12-30 DIAGNOSIS — Z23 Encounter for immunization: Secondary | ICD-10-CM | POA: Diagnosis not present

## 2019-12-30 DIAGNOSIS — Z418 Encounter for other procedures for purposes other than remedying health state: Secondary | ICD-10-CM | POA: Diagnosis not present

## 2020-01-01 DIAGNOSIS — Z418 Encounter for other procedures for purposes other than remedying health state: Secondary | ICD-10-CM | POA: Diagnosis not present

## 2020-01-01 DIAGNOSIS — R600 Localized edema: Secondary | ICD-10-CM | POA: Diagnosis not present

## 2020-01-01 DIAGNOSIS — I482 Chronic atrial fibrillation, unspecified: Secondary | ICD-10-CM | POA: Diagnosis not present

## 2020-01-01 DIAGNOSIS — Z86711 Personal history of pulmonary embolism: Secondary | ICD-10-CM | POA: Diagnosis not present

## 2020-01-01 DIAGNOSIS — I480 Paroxysmal atrial fibrillation: Secondary | ICD-10-CM | POA: Diagnosis not present

## 2020-01-01 DIAGNOSIS — D509 Iron deficiency anemia, unspecified: Secondary | ICD-10-CM | POA: Diagnosis not present

## 2020-01-01 DIAGNOSIS — S32009A Unspecified fracture of unspecified lumbar vertebra, initial encounter for closed fracture: Secondary | ICD-10-CM | POA: Diagnosis not present

## 2020-01-01 DIAGNOSIS — J449 Chronic obstructive pulmonary disease, unspecified: Secondary | ICD-10-CM | POA: Diagnosis not present

## 2020-01-01 DIAGNOSIS — N186 End stage renal disease: Secondary | ICD-10-CM | POA: Diagnosis not present

## 2020-01-01 DIAGNOSIS — I4891 Unspecified atrial fibrillation: Secondary | ICD-10-CM | POA: Diagnosis not present

## 2020-01-01 DIAGNOSIS — E1140 Type 2 diabetes mellitus with diabetic neuropathy, unspecified: Secondary | ICD-10-CM | POA: Diagnosis not present

## 2020-01-01 DIAGNOSIS — M6281 Muscle weakness (generalized): Secondary | ICD-10-CM | POA: Diagnosis not present

## 2020-01-01 DIAGNOSIS — I2609 Other pulmonary embolism with acute cor pulmonale: Secondary | ICD-10-CM | POA: Diagnosis not present

## 2020-01-01 DIAGNOSIS — Z23 Encounter for immunization: Secondary | ICD-10-CM | POA: Diagnosis not present

## 2020-01-01 DIAGNOSIS — I509 Heart failure, unspecified: Secondary | ICD-10-CM | POA: Diagnosis not present

## 2020-01-04 ENCOUNTER — Telehealth (INDEPENDENT_AMBULATORY_CARE_PROVIDER_SITE_OTHER): Payer: Self-pay

## 2020-01-04 DIAGNOSIS — Z23 Encounter for immunization: Secondary | ICD-10-CM | POA: Diagnosis not present

## 2020-01-04 DIAGNOSIS — N186 End stage renal disease: Secondary | ICD-10-CM | POA: Diagnosis not present

## 2020-01-04 DIAGNOSIS — Z418 Encounter for other procedures for purposes other than remedying health state: Secondary | ICD-10-CM | POA: Diagnosis not present

## 2020-01-04 DIAGNOSIS — D509 Iron deficiency anemia, unspecified: Secondary | ICD-10-CM | POA: Diagnosis not present

## 2020-01-05 ENCOUNTER — Encounter: Payer: Self-pay | Admitting: *Deleted

## 2020-01-05 ENCOUNTER — Encounter (INDEPENDENT_AMBULATORY_CARE_PROVIDER_SITE_OTHER): Payer: Self-pay

## 2020-01-05 NOTE — Telephone Encounter (Signed)
I spoke with Dr. Delana Meyer and he said that we can attempt to do a fistulogram but after 3 months it may not be successful.  If it is not then we will need to plan new dialysis access.  Let us know if the patient would like to proceed and we will reach out and get him scheduled.

## 2020-01-05 NOTE — Telephone Encounter (Signed)
Can we get him scheduled for a Right Upper Extremity Shuntogram with Dr. Delana Meyer ..he is at [place] and the number is [phone]

## 2020-01-05 NOTE — Telephone Encounter (Signed)
Patient wants to proceed with scheduling and informed that he is not leaving [facility] [phone] at this time.

## 2020-01-06 DIAGNOSIS — Z992 Dependence on renal dialysis: Secondary | ICD-10-CM | POA: Diagnosis not present

## 2020-01-06 DIAGNOSIS — N186 End stage renal disease: Secondary | ICD-10-CM | POA: Diagnosis not present

## 2020-01-06 DIAGNOSIS — Z418 Encounter for other procedures for purposes other than remedying health state: Secondary | ICD-10-CM | POA: Diagnosis not present

## 2020-01-06 DIAGNOSIS — Z23 Encounter for immunization: Secondary | ICD-10-CM | POA: Diagnosis not present

## 2020-01-06 DIAGNOSIS — D509 Iron deficiency anemia, unspecified: Secondary | ICD-10-CM | POA: Diagnosis not present

## 2020-01-08 DIAGNOSIS — D509 Iron deficiency anemia, unspecified: Secondary | ICD-10-CM | POA: Diagnosis not present

## 2020-01-08 DIAGNOSIS — N2581 Secondary hyperparathyroidism of renal origin: Secondary | ICD-10-CM | POA: Diagnosis not present

## 2020-01-08 DIAGNOSIS — D899 Disorder involving the immune mechanism, unspecified: Secondary | ICD-10-CM | POA: Diagnosis not present

## 2020-01-08 DIAGNOSIS — N186 End stage renal disease: Secondary | ICD-10-CM | POA: Diagnosis not present

## 2020-01-11 ENCOUNTER — Other Ambulatory Visit: Admission: RE | Admit: 2020-01-11 | Payer: Medicaid Other | Source: Ambulatory Visit

## 2020-01-11 DIAGNOSIS — D509 Iron deficiency anemia, unspecified: Secondary | ICD-10-CM | POA: Diagnosis not present

## 2020-01-11 DIAGNOSIS — D899 Disorder involving the immune mechanism, unspecified: Secondary | ICD-10-CM | POA: Diagnosis not present

## 2020-01-11 DIAGNOSIS — N2581 Secondary hyperparathyroidism of renal origin: Secondary | ICD-10-CM | POA: Diagnosis not present

## 2020-01-11 DIAGNOSIS — N186 End stage renal disease: Secondary | ICD-10-CM | POA: Diagnosis not present

## 2020-01-12 ENCOUNTER — Other Ambulatory Visit (INDEPENDENT_AMBULATORY_CARE_PROVIDER_SITE_OTHER): Payer: Self-pay | Admitting: Nurse Practitioner

## 2020-01-12 ENCOUNTER — Telehealth (INDEPENDENT_AMBULATORY_CARE_PROVIDER_SITE_OTHER): Payer: Self-pay

## 2020-01-12 NOTE — Telephone Encounter (Signed)
I spoke with Kizzy cooper on 01/06/20 regarding the patient's procedure with Dr. Delana Meyer on 01/13/20. Kizzy stated the patient was an hour away and was questioning the patient having his procedure at [facility]. Kizzy stated she was going to call his nephrologist and call back. I received a return call from [facility] on 01/11/20 and have returned the call and left a message for a return call.

## 2020-01-13 ENCOUNTER — Encounter: Admission: RE | Payer: Self-pay | Source: Home / Self Care

## 2020-01-13 ENCOUNTER — Ambulatory Visit: Admission: RE | Admit: 2020-01-13 | Payer: Medicare Other | Source: Home / Self Care | Admitting: Vascular Surgery

## 2020-01-13 DIAGNOSIS — N2581 Secondary hyperparathyroidism of renal origin: Secondary | ICD-10-CM | POA: Diagnosis not present

## 2020-01-13 DIAGNOSIS — D509 Iron deficiency anemia, unspecified: Secondary | ICD-10-CM | POA: Diagnosis not present

## 2020-01-13 DIAGNOSIS — D899 Disorder involving the immune mechanism, unspecified: Secondary | ICD-10-CM | POA: Diagnosis not present

## 2020-01-13 DIAGNOSIS — N186 End stage renal disease: Secondary | ICD-10-CM | POA: Diagnosis not present

## 2020-01-13 SURGERY — A/V SHUNTOGRAM
Anesthesia: Moderate Sedation | Site: Arm Upper | Laterality: Right

## 2020-01-14 DIAGNOSIS — T8249XD Other complication of vascular dialysis catheter, subsequent encounter: Secondary | ICD-10-CM | POA: Diagnosis not present

## 2020-01-14 DIAGNOSIS — I739 Peripheral vascular disease, unspecified: Secondary | ICD-10-CM | POA: Diagnosis not present

## 2020-01-14 DIAGNOSIS — N186 End stage renal disease: Secondary | ICD-10-CM | POA: Diagnosis not present

## 2020-01-14 DIAGNOSIS — I742 Embolism and thrombosis of arteries of the upper extremities: Secondary | ICD-10-CM | POA: Diagnosis not present

## 2020-01-15 DIAGNOSIS — D509 Iron deficiency anemia, unspecified: Secondary | ICD-10-CM | POA: Diagnosis not present

## 2020-01-15 DIAGNOSIS — D899 Disorder involving the immune mechanism, unspecified: Secondary | ICD-10-CM | POA: Diagnosis not present

## 2020-01-15 DIAGNOSIS — N2581 Secondary hyperparathyroidism of renal origin: Secondary | ICD-10-CM | POA: Diagnosis not present

## 2020-01-15 DIAGNOSIS — N186 End stage renal disease: Secondary | ICD-10-CM | POA: Diagnosis not present

## 2020-01-18 DIAGNOSIS — D899 Disorder involving the immune mechanism, unspecified: Secondary | ICD-10-CM | POA: Diagnosis not present

## 2020-01-18 DIAGNOSIS — D509 Iron deficiency anemia, unspecified: Secondary | ICD-10-CM | POA: Diagnosis not present

## 2020-01-18 DIAGNOSIS — N186 End stage renal disease: Secondary | ICD-10-CM | POA: Diagnosis not present

## 2020-01-18 DIAGNOSIS — N2581 Secondary hyperparathyroidism of renal origin: Secondary | ICD-10-CM | POA: Diagnosis not present

## 2020-01-19 DIAGNOSIS — Q211 Atrial septal defect: Secondary | ICD-10-CM | POA: Diagnosis not present

## 2020-01-19 DIAGNOSIS — M546 Pain in thoracic spine: Secondary | ICD-10-CM | POA: Diagnosis not present

## 2020-01-19 DIAGNOSIS — Z86711 Personal history of pulmonary embolism: Secondary | ICD-10-CM | POA: Diagnosis not present

## 2020-01-19 DIAGNOSIS — I2782 Chronic pulmonary embolism: Secondary | ICD-10-CM | POA: Diagnosis not present

## 2020-01-19 DIAGNOSIS — R471 Dysarthria and anarthria: Secondary | ICD-10-CM | POA: Diagnosis not present

## 2020-01-19 DIAGNOSIS — I120 Hypertensive chronic kidney disease with stage 5 chronic kidney disease or end stage renal disease: Secondary | ICD-10-CM | POA: Diagnosis not present

## 2020-01-19 DIAGNOSIS — N186 End stage renal disease: Secondary | ICD-10-CM | POA: Diagnosis not present

## 2020-01-19 DIAGNOSIS — R4701 Aphasia: Secondary | ICD-10-CM | POA: Diagnosis not present

## 2020-01-19 DIAGNOSIS — I2609 Other pulmonary embolism with acute cor pulmonale: Secondary | ICD-10-CM | POA: Diagnosis not present

## 2020-01-19 DIAGNOSIS — Z20822 Contact with and (suspected) exposure to covid-19: Secondary | ICD-10-CM | POA: Diagnosis not present

## 2020-01-19 DIAGNOSIS — R791 Abnormal coagulation profile: Secondary | ICD-10-CM | POA: Diagnosis not present

## 2020-01-19 DIAGNOSIS — I4891 Unspecified atrial fibrillation: Secondary | ICD-10-CM | POA: Diagnosis not present

## 2020-01-19 DIAGNOSIS — M4844XG Fatigue fracture of vertebra, thoracic region, subsequent encounter for fracture with delayed healing: Secondary | ICD-10-CM | POA: Diagnosis not present

## 2020-01-19 DIAGNOSIS — Z992 Dependence on renal dialysis: Secondary | ICD-10-CM | POA: Diagnosis not present

## 2020-01-19 DIAGNOSIS — G8929 Other chronic pain: Secondary | ICD-10-CM | POA: Diagnosis not present

## 2020-01-19 DIAGNOSIS — T50901A Poisoning by unspecified drugs, medicaments and biological substances, accidental (unintentional), initial encounter: Secondary | ICD-10-CM | POA: Diagnosis not present

## 2020-01-19 DIAGNOSIS — Z8673 Personal history of transient ischemic attack (TIA), and cerebral infarction without residual deficits: Secondary | ICD-10-CM | POA: Diagnosis not present

## 2020-01-19 DIAGNOSIS — I639 Cerebral infarction, unspecified: Secondary | ICD-10-CM | POA: Diagnosis not present

## 2020-01-20 DIAGNOSIS — Z992 Dependence on renal dialysis: Secondary | ICD-10-CM | POA: Diagnosis not present

## 2020-01-20 DIAGNOSIS — I120 Hypertensive chronic kidney disease with stage 5 chronic kidney disease or end stage renal disease: Secondary | ICD-10-CM | POA: Diagnosis present

## 2020-01-20 DIAGNOSIS — R4182 Altered mental status, unspecified: Secondary | ICD-10-CM | POA: Diagnosis not present

## 2020-01-20 DIAGNOSIS — Q211 Atrial septal defect: Secondary | ICD-10-CM | POA: Diagnosis not present

## 2020-01-20 DIAGNOSIS — G459 Transient cerebral ischemic attack, unspecified: Secondary | ICD-10-CM | POA: Diagnosis not present

## 2020-01-20 DIAGNOSIS — R0602 Shortness of breath: Secondary | ICD-10-CM | POA: Diagnosis not present

## 2020-01-20 DIAGNOSIS — R2681 Unsteadiness on feet: Secondary | ICD-10-CM | POA: Diagnosis not present

## 2020-01-20 DIAGNOSIS — I82411 Acute embolism and thrombosis of right femoral vein: Secondary | ICD-10-CM | POA: Diagnosis present

## 2020-01-20 DIAGNOSIS — J449 Chronic obstructive pulmonary disease, unspecified: Secondary | ICD-10-CM | POA: Diagnosis not present

## 2020-01-20 DIAGNOSIS — E1165 Type 2 diabetes mellitus with hyperglycemia: Secondary | ICD-10-CM | POA: Diagnosis not present

## 2020-01-20 DIAGNOSIS — Z743 Need for continuous supervision: Secondary | ICD-10-CM | POA: Diagnosis not present

## 2020-01-20 DIAGNOSIS — I749 Embolism and thrombosis of unspecified artery: Secondary | ICD-10-CM | POA: Diagnosis present

## 2020-01-20 DIAGNOSIS — I639 Cerebral infarction, unspecified: Secondary | ICD-10-CM | POA: Diagnosis not present

## 2020-01-20 DIAGNOSIS — I825Y1 Chronic embolism and thrombosis of unspecified deep veins of right proximal lower extremity: Secondary | ICD-10-CM | POA: Diagnosis not present

## 2020-01-20 DIAGNOSIS — I63532 Cerebral infarction due to unspecified occlusion or stenosis of left posterior cerebral artery: Secondary | ICD-10-CM | POA: Diagnosis not present

## 2020-01-20 DIAGNOSIS — I10 Essential (primary) hypertension: Secondary | ICD-10-CM | POA: Diagnosis not present

## 2020-01-20 DIAGNOSIS — G4700 Insomnia, unspecified: Secondary | ICD-10-CM | POA: Diagnosis not present

## 2020-01-20 DIAGNOSIS — Z794 Long term (current) use of insulin: Secondary | ICD-10-CM | POA: Diagnosis not present

## 2020-01-20 DIAGNOSIS — I081 Rheumatic disorders of both mitral and tricuspid valves: Secondary | ICD-10-CM | POA: Diagnosis not present

## 2020-01-20 DIAGNOSIS — Z86711 Personal history of pulmonary embolism: Secondary | ICD-10-CM | POA: Diagnosis not present

## 2020-01-20 DIAGNOSIS — I739 Peripheral vascular disease, unspecified: Secondary | ICD-10-CM | POA: Diagnosis not present

## 2020-01-20 DIAGNOSIS — I82431 Acute embolism and thrombosis of right popliteal vein: Secondary | ICD-10-CM | POA: Diagnosis present

## 2020-01-20 DIAGNOSIS — R29700 NIHSS score 0: Secondary | ICD-10-CM | POA: Diagnosis present

## 2020-01-20 DIAGNOSIS — E039 Hypothyroidism, unspecified: Secondary | ICD-10-CM | POA: Diagnosis not present

## 2020-01-20 DIAGNOSIS — K219 Gastro-esophageal reflux disease without esophagitis: Secondary | ICD-10-CM | POA: Diagnosis not present

## 2020-01-20 DIAGNOSIS — Z20822 Contact with and (suspected) exposure to covid-19: Secondary | ICD-10-CM | POA: Diagnosis not present

## 2020-01-20 DIAGNOSIS — R279 Unspecified lack of coordination: Secondary | ICD-10-CM | POA: Diagnosis not present

## 2020-01-20 DIAGNOSIS — I517 Cardiomegaly: Secondary | ICD-10-CM | POA: Diagnosis not present

## 2020-01-20 DIAGNOSIS — R471 Dysarthria and anarthria: Secondary | ICD-10-CM | POA: Diagnosis not present

## 2020-01-20 DIAGNOSIS — E1140 Type 2 diabetes mellitus with diabetic neuropathy, unspecified: Secondary | ICD-10-CM | POA: Diagnosis not present

## 2020-01-20 DIAGNOSIS — R4701 Aphasia: Secondary | ICD-10-CM | POA: Diagnosis not present

## 2020-01-20 DIAGNOSIS — R269 Unspecified abnormalities of gait and mobility: Secondary | ICD-10-CM | POA: Diagnosis not present

## 2020-01-20 DIAGNOSIS — I2699 Other pulmonary embolism without acute cor pulmonale: Secondary | ICD-10-CM | POA: Diagnosis not present

## 2020-01-20 DIAGNOSIS — R791 Abnormal coagulation profile: Secondary | ICD-10-CM | POA: Diagnosis not present

## 2020-01-20 DIAGNOSIS — I6350 Cerebral infarction due to unspecified occlusion or stenosis of unspecified cerebral artery: Secondary | ICD-10-CM | POA: Diagnosis not present

## 2020-01-20 DIAGNOSIS — I744 Embolism and thrombosis of arteries of extremities, unspecified: Secondary | ICD-10-CM | POA: Diagnosis not present

## 2020-01-20 DIAGNOSIS — N186 End stage renal disease: Secondary | ICD-10-CM | POA: Diagnosis not present

## 2020-01-20 DIAGNOSIS — R0902 Hypoxemia: Secondary | ICD-10-CM | POA: Diagnosis not present

## 2020-01-20 DIAGNOSIS — I70208 Unspecified atherosclerosis of native arteries of extremities, other extremity: Secondary | ICD-10-CM | POA: Diagnosis not present

## 2020-01-20 DIAGNOSIS — R4781 Slurred speech: Secondary | ICD-10-CM | POA: Diagnosis not present

## 2020-01-20 DIAGNOSIS — G4733 Obstructive sleep apnea (adult) (pediatric): Secondary | ICD-10-CM | POA: Diagnosis not present

## 2020-01-20 DIAGNOSIS — M6281 Muscle weakness (generalized): Secondary | ICD-10-CM | POA: Diagnosis not present

## 2020-01-20 DIAGNOSIS — F331 Major depressive disorder, recurrent, moderate: Secondary | ICD-10-CM | POA: Diagnosis not present

## 2020-01-20 DIAGNOSIS — T50901A Poisoning by unspecified drugs, medicaments and biological substances, accidental (unintentional), initial encounter: Secondary | ICD-10-CM | POA: Diagnosis not present

## 2020-01-20 DIAGNOSIS — Z87891 Personal history of nicotine dependence: Secondary | ICD-10-CM | POA: Diagnosis not present

## 2020-01-20 DIAGNOSIS — I4891 Unspecified atrial fibrillation: Secondary | ICD-10-CM | POA: Diagnosis not present

## 2020-01-20 DIAGNOSIS — Z8673 Personal history of transient ischemic attack (TIA), and cerebral infarction without residual deficits: Secondary | ICD-10-CM | POA: Diagnosis not present

## 2020-01-20 DIAGNOSIS — I313 Pericardial effusion (noninflammatory): Secondary | ICD-10-CM | POA: Diagnosis not present

## 2020-01-20 DIAGNOSIS — E119 Type 2 diabetes mellitus without complications: Secondary | ICD-10-CM | POA: Diagnosis not present

## 2020-01-20 DIAGNOSIS — I2782 Chronic pulmonary embolism: Secondary | ICD-10-CM | POA: Diagnosis present

## 2020-01-24 IMAGING — RF DG ANKLE 2V *R*
1 series · 13 of 13 positions shown · non-contrast
Comparison: Preoperative radiographs.

CLINICAL DATA: ORIF right ankle fracture.

EXAM:
RIGHT ANKLE - 2 VIEW; DG C-ARM 1-60 MIN

[Series 1: run · 13 of 13 slices shown]
[im 1/13]
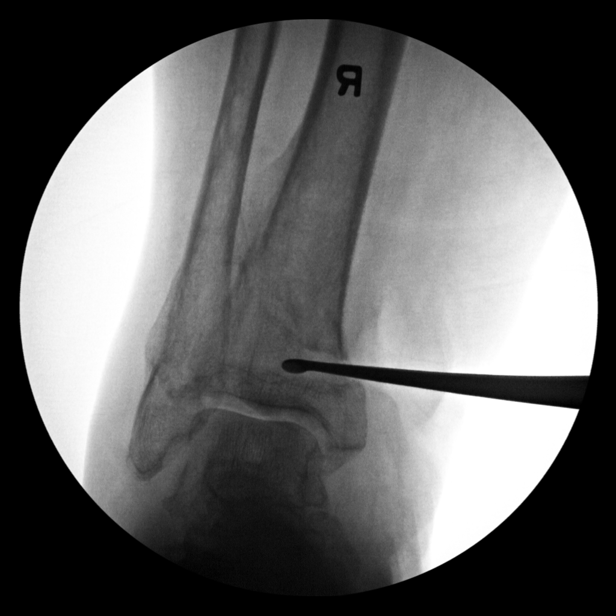
[im 2/13]
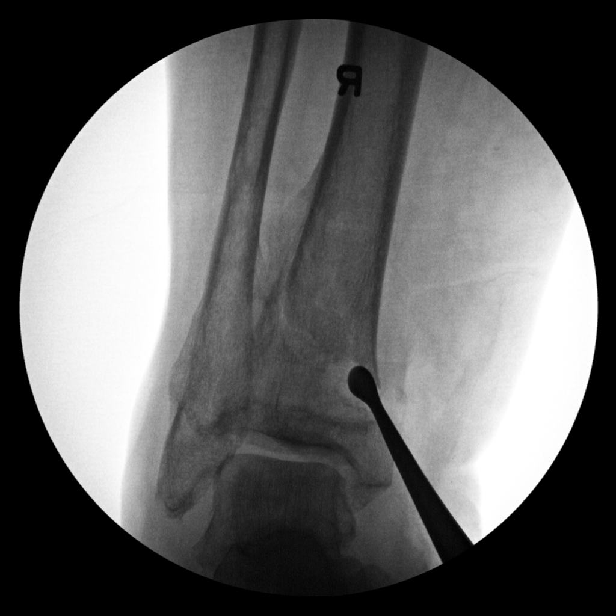
[im 3/13]
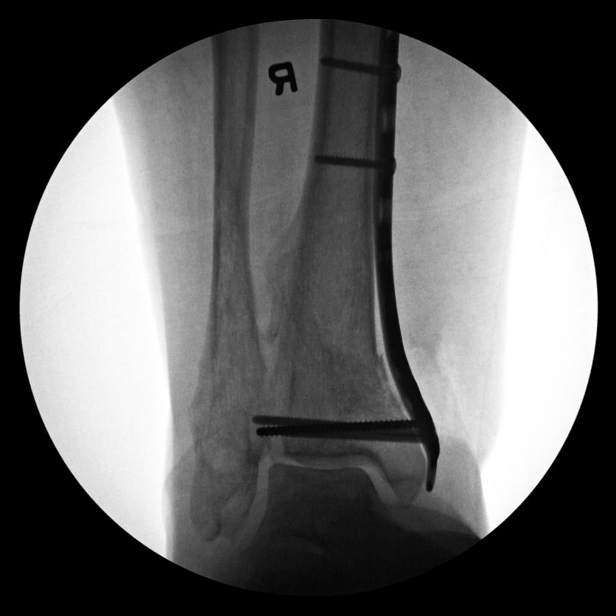
[im 4/13]
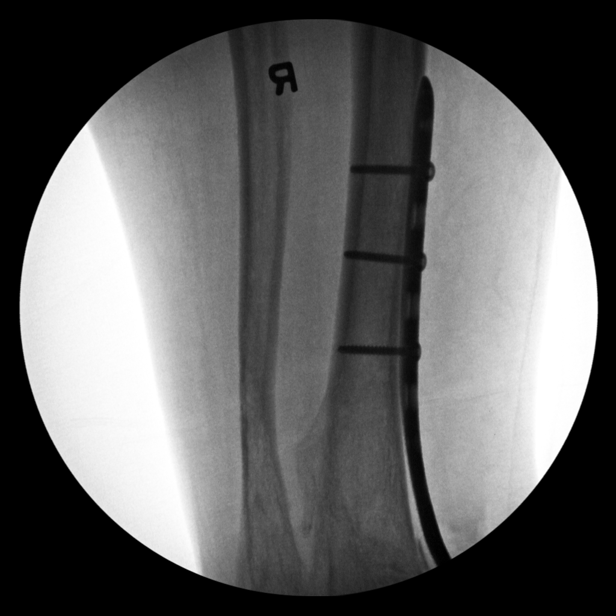
[im 5/13]
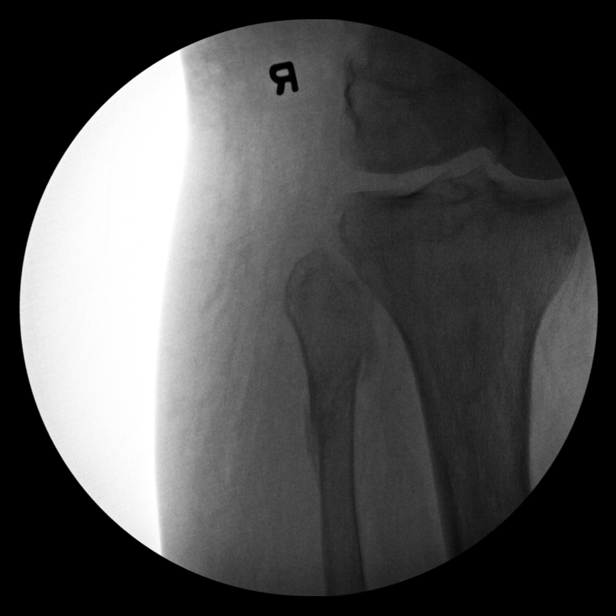
[im 6/13]
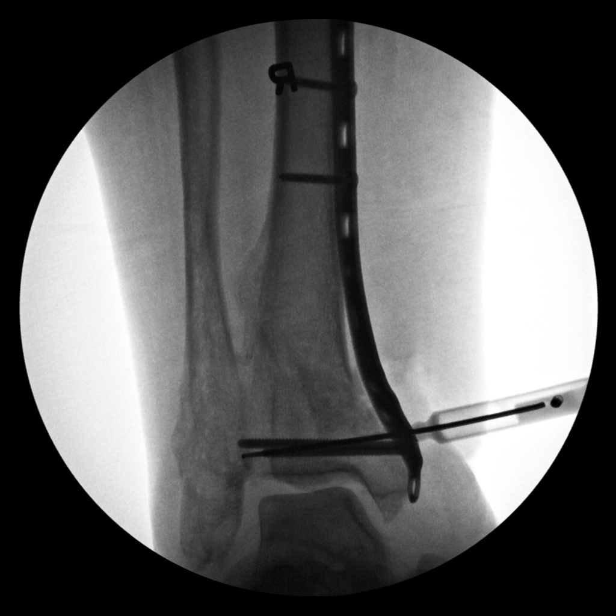
[im 7/13]
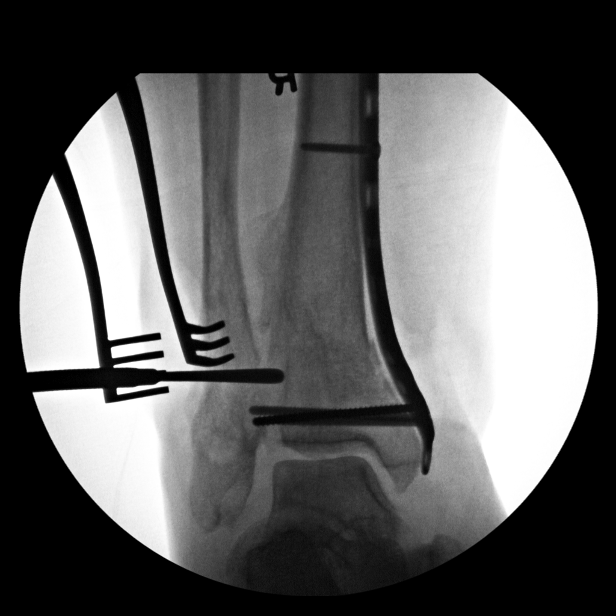
[im 8/13]
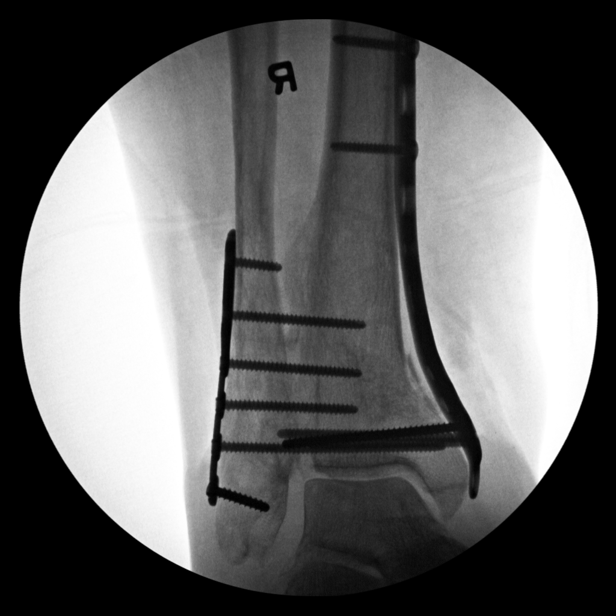
[im 9/13]
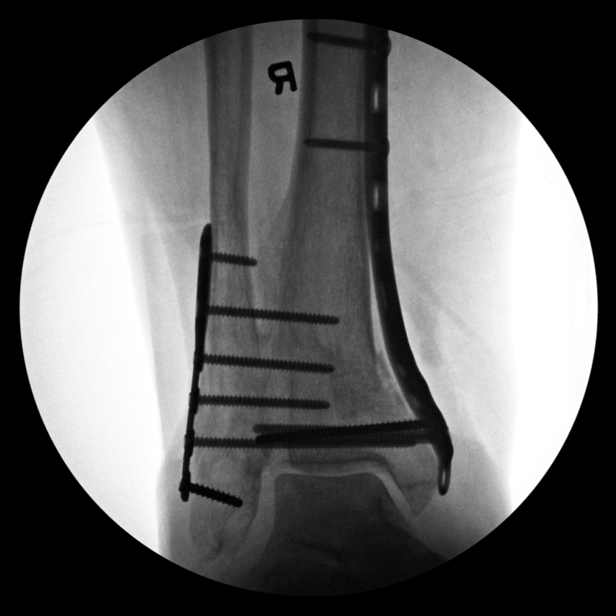
[im 10/13]
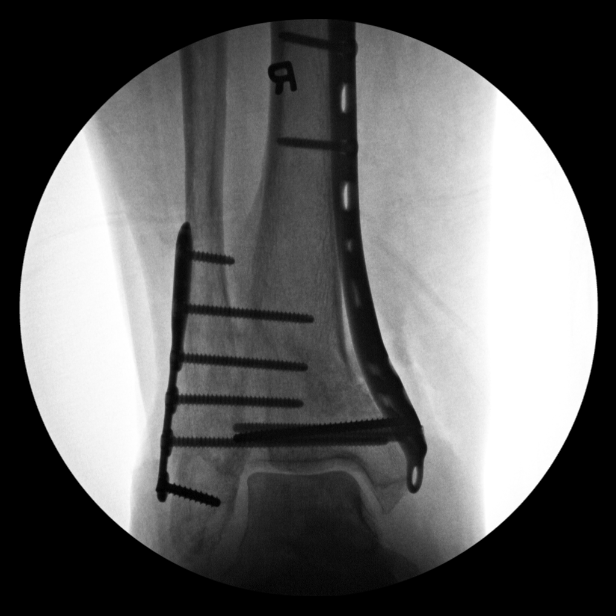
[im 11/13]
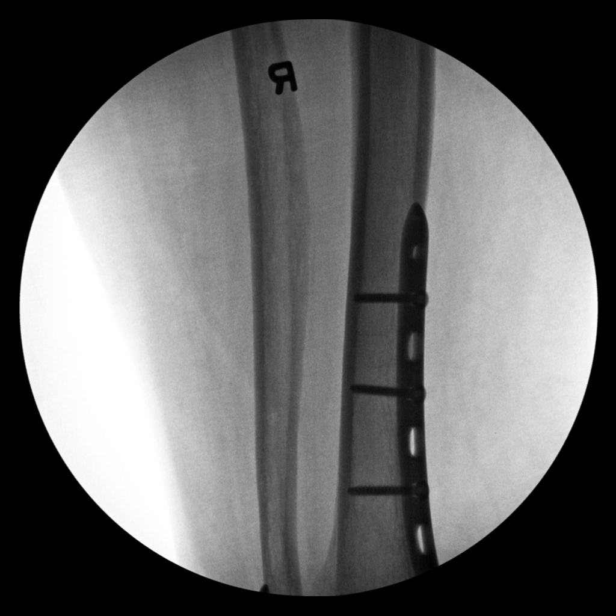
[im 12/13]
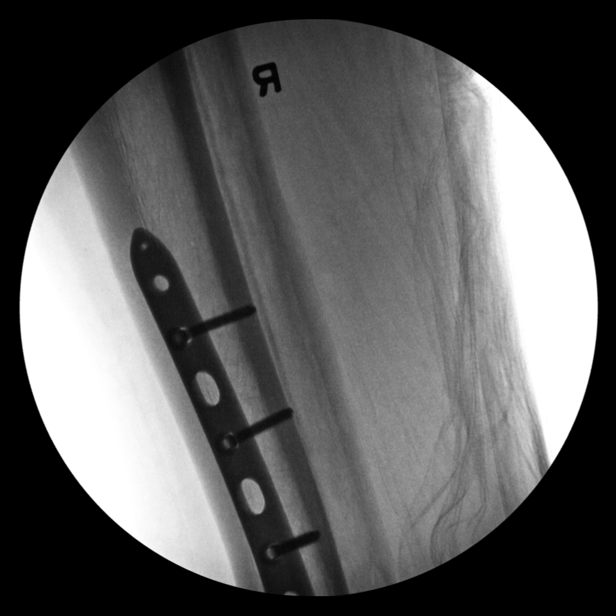
[im 13/13]
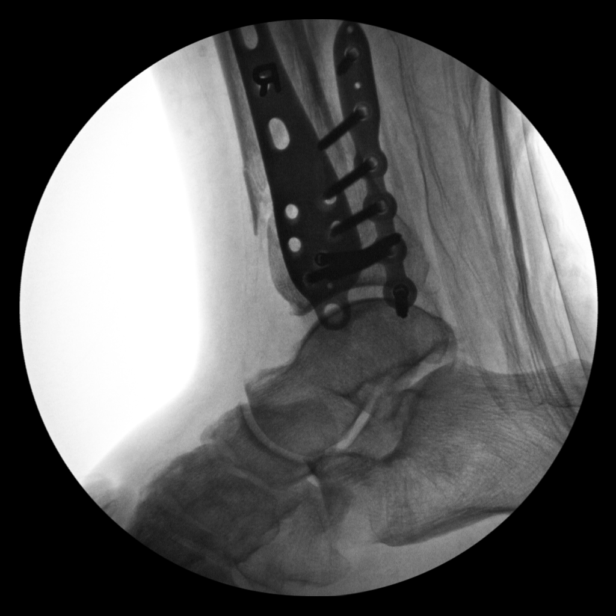

[13 of 13 positions shown; findings below may reference images not displayed]

FINDINGS: Thirteen fluoroscopic spot images from the operating room provided.
Images demonstrate sequential lateral plate and multi screw fixation
of distal tibial fracture. Plate and multi screw fixation including
syndesmotic screws of the distal fibula. AP view of the proximal
fibula demonstrates fibular neck fracture, age indeterminate on
fluoroscopic spot view. Total fluoroscopy time 1 minutes 22 seconds.
IMPRESSION: Fluoroscopic spot images during ORIF of ankle fractures.

## 2020-01-24 IMAGING — DX DG ANKLE COMPLETE 3+V*R*
3 series · 3 of 3 positions shown · non-contrast
Comparison: None.

CLINICAL DATA: ORIF ankle fractures.

EXAM:
RIGHT ANKLE - COMPLETE 3+ VIEW

[ankle ap]
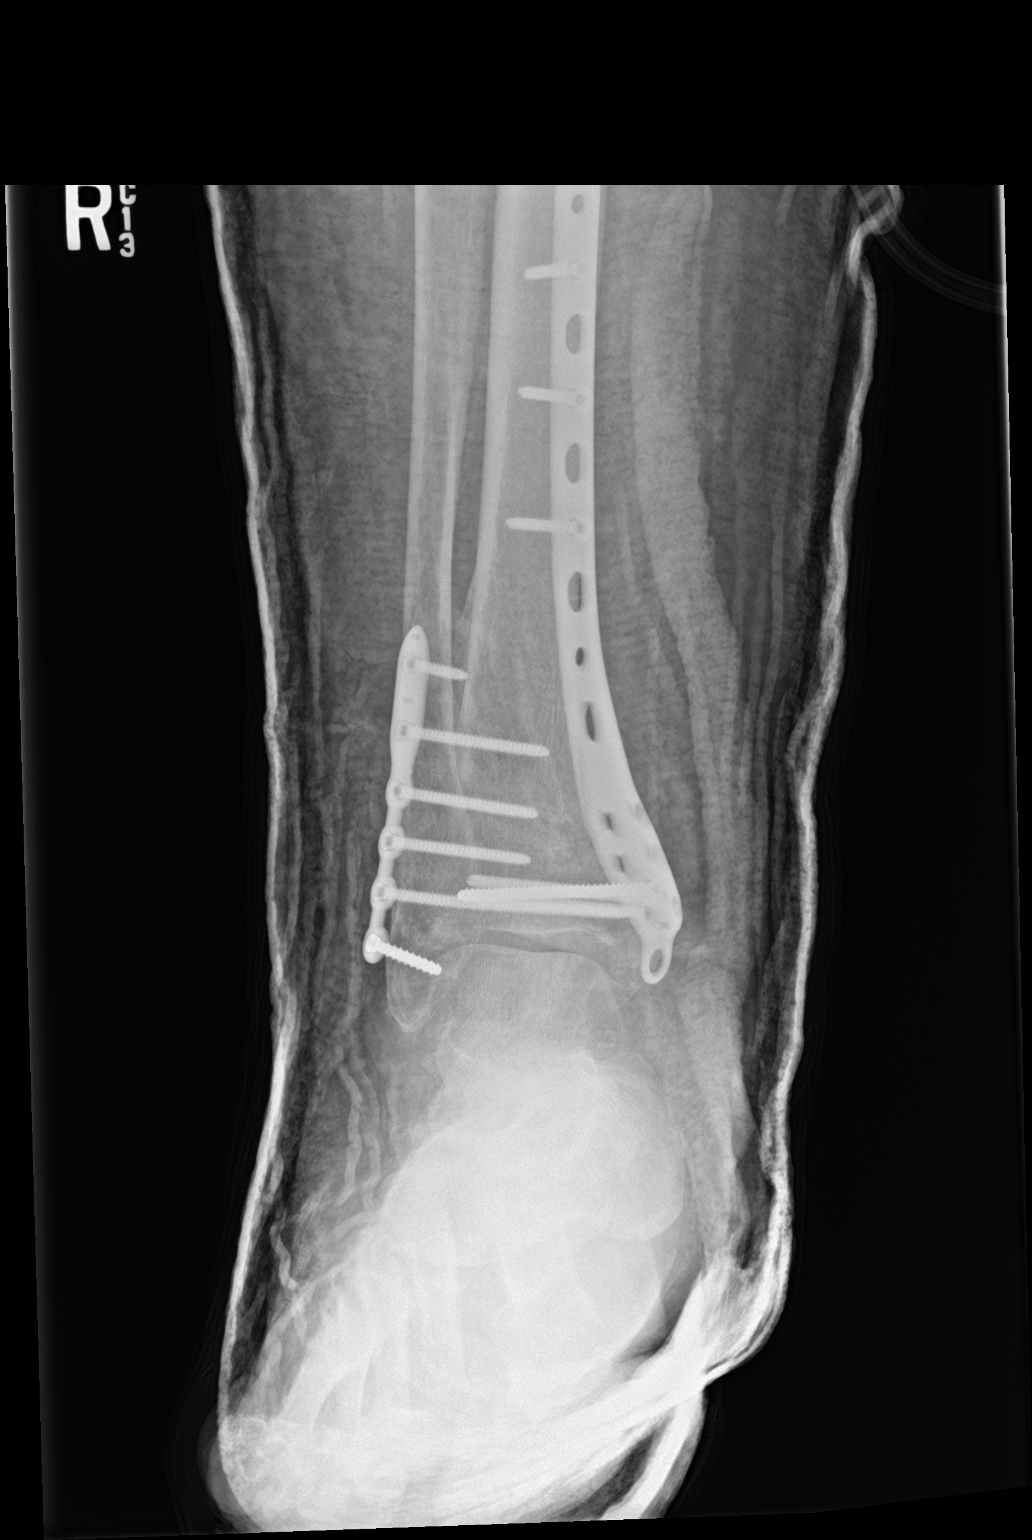

[ankle obl]
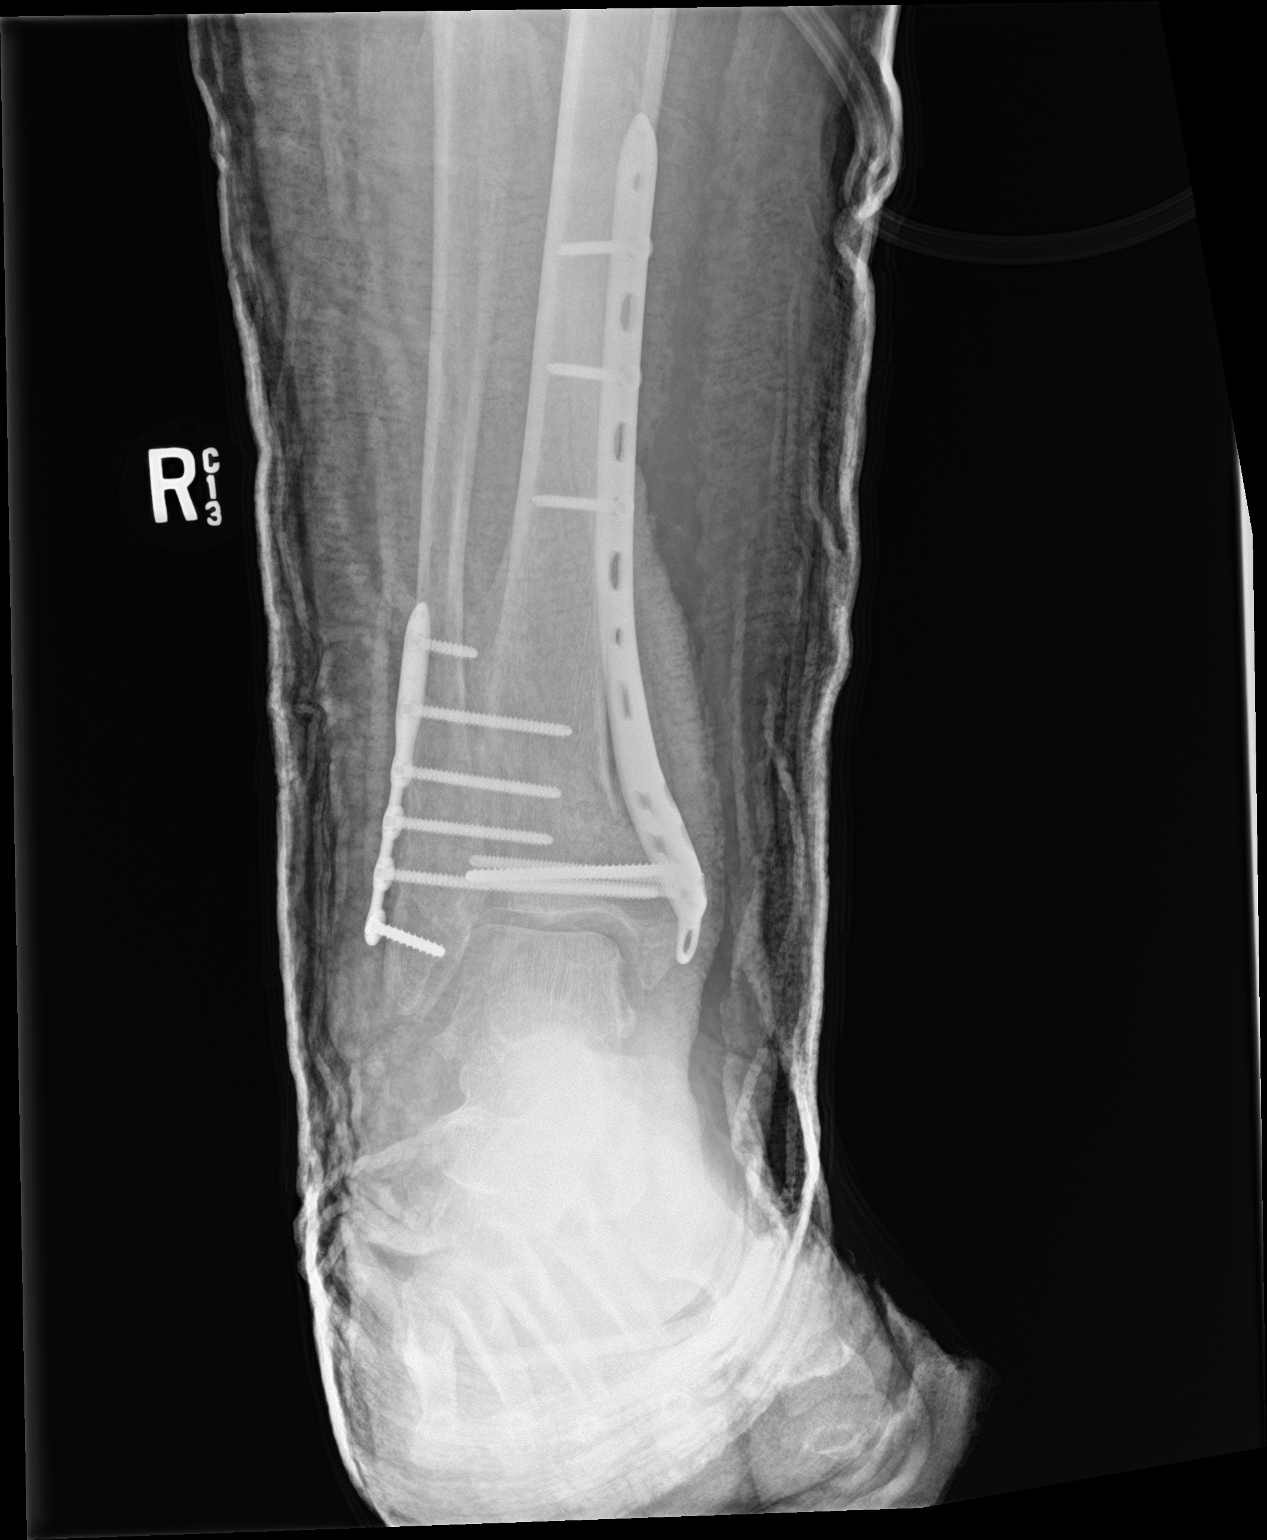

[ankle lat]
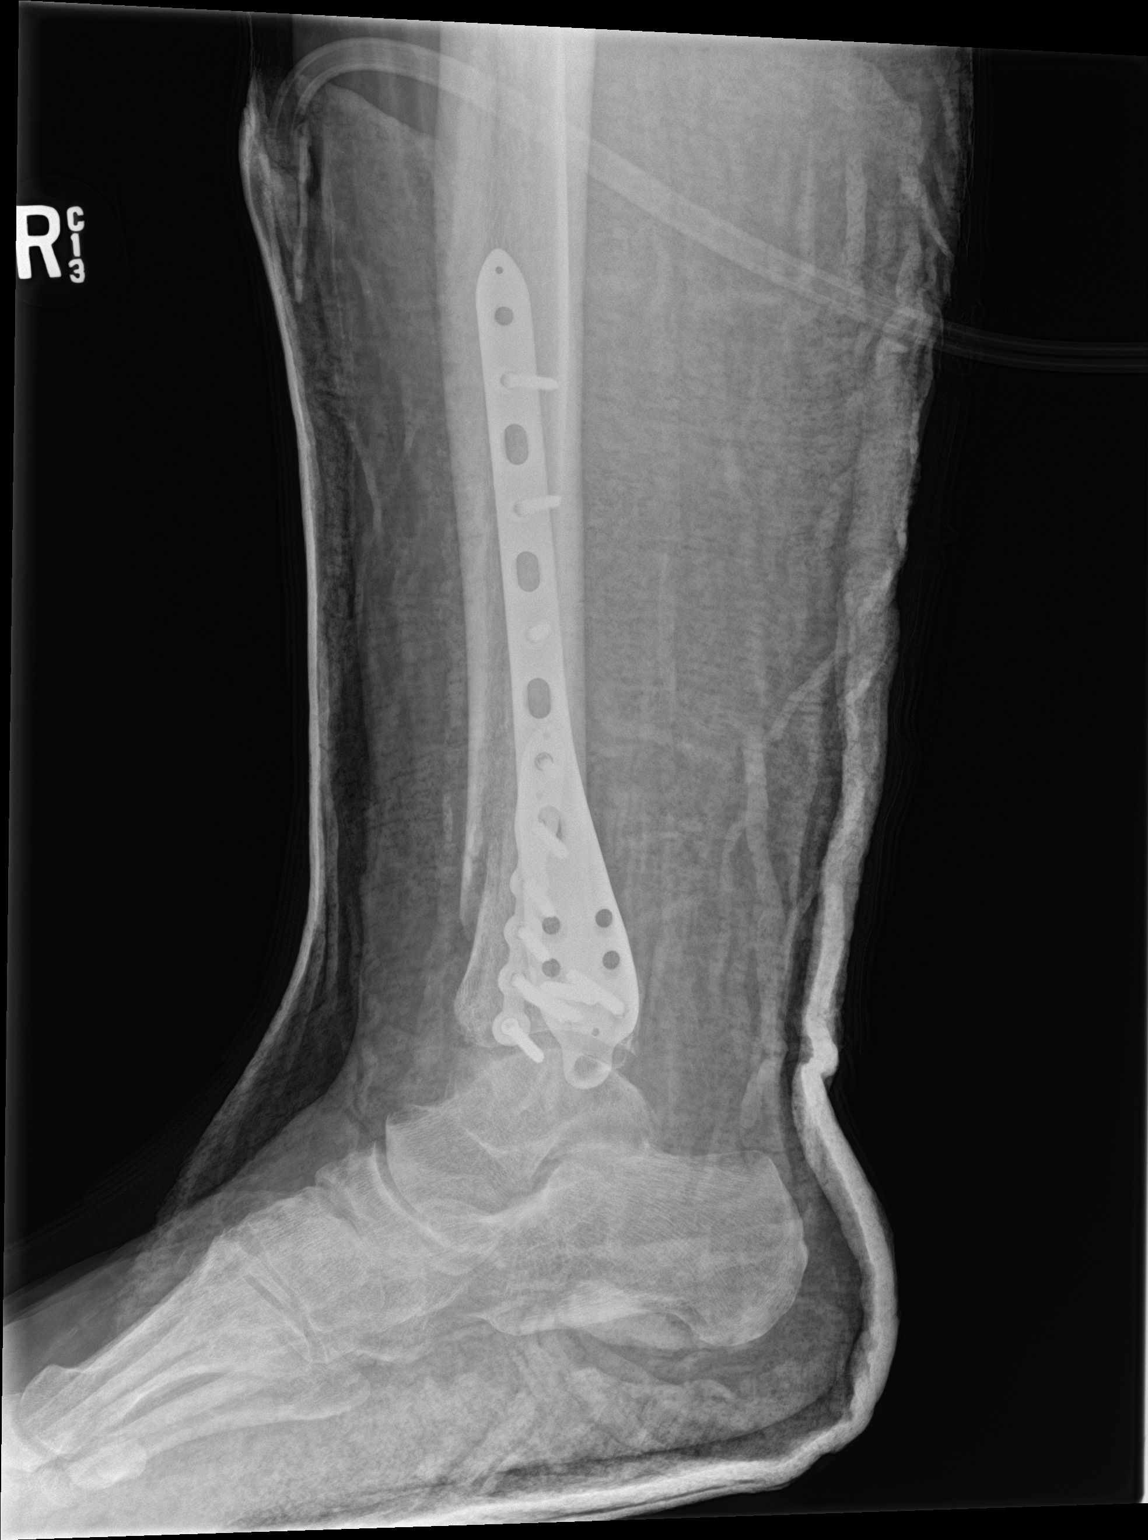

[3 of 3 positions shown; findings below may reference images not displayed]

FINDINGS: Internal plate and screw fixation noted traversing distal tibial and
fibular fractures, in near-anatomic alignment and position. No
definite complicating features are identified.
IMPRESSION: ORIF distal tibial and fibular fractures, in near-anatomic alignment
and position.

## 2020-01-25 DIAGNOSIS — M6281 Muscle weakness (generalized): Secondary | ICD-10-CM | POA: Diagnosis not present

## 2020-01-25 DIAGNOSIS — I739 Peripheral vascular disease, unspecified: Secondary | ICD-10-CM | POA: Diagnosis not present

## 2020-01-25 DIAGNOSIS — I639 Cerebral infarction, unspecified: Secondary | ICD-10-CM | POA: Diagnosis not present

## 2020-01-25 DIAGNOSIS — E119 Type 2 diabetes mellitus without complications: Secondary | ICD-10-CM | POA: Diagnosis not present

## 2020-01-25 DIAGNOSIS — I2782 Chronic pulmonary embolism: Secondary | ICD-10-CM | POA: Diagnosis not present

## 2020-01-25 DIAGNOSIS — I63532 Cerebral infarction due to unspecified occlusion or stenosis of left posterior cerebral artery: Secondary | ICD-10-CM | POA: Diagnosis not present

## 2020-01-25 DIAGNOSIS — R0602 Shortness of breath: Secondary | ICD-10-CM | POA: Diagnosis not present

## 2020-01-25 DIAGNOSIS — R269 Unspecified abnormalities of gait and mobility: Secondary | ICD-10-CM | POA: Diagnosis not present

## 2020-01-25 DIAGNOSIS — R4701 Aphasia: Secondary | ICD-10-CM | POA: Diagnosis not present

## 2020-01-25 DIAGNOSIS — I313 Pericardial effusion (noninflammatory): Secondary | ICD-10-CM | POA: Diagnosis not present

## 2020-01-25 DIAGNOSIS — Q211 Atrial septal defect: Secondary | ICD-10-CM | POA: Diagnosis not present

## 2020-01-25 DIAGNOSIS — R0902 Hypoxemia: Secondary | ICD-10-CM | POA: Diagnosis not present

## 2020-01-25 DIAGNOSIS — Z86711 Personal history of pulmonary embolism: Secondary | ICD-10-CM | POA: Diagnosis not present

## 2020-01-25 DIAGNOSIS — G4700 Insomnia, unspecified: Secondary | ICD-10-CM | POA: Diagnosis not present

## 2020-01-25 DIAGNOSIS — R2681 Unsteadiness on feet: Secondary | ICD-10-CM | POA: Diagnosis not present

## 2020-01-25 DIAGNOSIS — J449 Chronic obstructive pulmonary disease, unspecified: Secondary | ICD-10-CM | POA: Diagnosis not present

## 2020-01-25 DIAGNOSIS — Z992 Dependence on renal dialysis: Secondary | ICD-10-CM | POA: Diagnosis not present

## 2020-01-25 DIAGNOSIS — I4891 Unspecified atrial fibrillation: Secondary | ICD-10-CM | POA: Diagnosis not present

## 2020-01-25 DIAGNOSIS — G4733 Obstructive sleep apnea (adult) (pediatric): Secondary | ICD-10-CM | POA: Diagnosis not present

## 2020-01-25 DIAGNOSIS — I825Y1 Chronic embolism and thrombosis of unspecified deep veins of right proximal lower extremity: Secondary | ICD-10-CM | POA: Diagnosis not present

## 2020-01-25 DIAGNOSIS — F331 Major depressive disorder, recurrent, moderate: Secondary | ICD-10-CM | POA: Diagnosis not present

## 2020-01-25 DIAGNOSIS — I6350 Cerebral infarction due to unspecified occlusion or stenosis of unspecified cerebral artery: Secondary | ICD-10-CM | POA: Diagnosis not present

## 2020-01-25 DIAGNOSIS — I120 Hypertensive chronic kidney disease with stage 5 chronic kidney disease or end stage renal disease: Secondary | ICD-10-CM | POA: Diagnosis not present

## 2020-01-25 DIAGNOSIS — Z20822 Contact with and (suspected) exposure to covid-19: Secondary | ICD-10-CM | POA: Diagnosis not present

## 2020-01-25 DIAGNOSIS — E039 Hypothyroidism, unspecified: Secondary | ICD-10-CM | POA: Diagnosis not present

## 2020-01-25 DIAGNOSIS — N186 End stage renal disease: Secondary | ICD-10-CM | POA: Diagnosis not present

## 2020-01-25 DIAGNOSIS — E1140 Type 2 diabetes mellitus with diabetic neuropathy, unspecified: Secondary | ICD-10-CM | POA: Diagnosis not present

## 2020-01-25 DIAGNOSIS — I10 Essential (primary) hypertension: Secondary | ICD-10-CM | POA: Diagnosis not present

## 2020-01-25 DIAGNOSIS — I2699 Other pulmonary embolism without acute cor pulmonale: Secondary | ICD-10-CM | POA: Diagnosis not present

## 2020-01-25 DIAGNOSIS — Z743 Need for continuous supervision: Secondary | ICD-10-CM | POA: Diagnosis not present

## 2020-01-25 DIAGNOSIS — R279 Unspecified lack of coordination: Secondary | ICD-10-CM | POA: Diagnosis not present

## 2020-01-25 DIAGNOSIS — K219 Gastro-esophageal reflux disease without esophagitis: Secondary | ICD-10-CM | POA: Diagnosis not present

## 2020-01-25 DIAGNOSIS — I70208 Unspecified atherosclerosis of native arteries of extremities, other extremity: Secondary | ICD-10-CM | POA: Diagnosis not present

## 2020-01-26 DIAGNOSIS — R2681 Unsteadiness on feet: Secondary | ICD-10-CM | POA: Diagnosis not present

## 2020-01-26 DIAGNOSIS — R600 Localized edema: Secondary | ICD-10-CM | POA: Diagnosis not present

## 2020-01-26 DIAGNOSIS — E039 Hypothyroidism, unspecified: Secondary | ICD-10-CM | POA: Diagnosis not present

## 2020-01-26 DIAGNOSIS — G4733 Obstructive sleep apnea (adult) (pediatric): Secondary | ICD-10-CM | POA: Diagnosis not present

## 2020-01-26 DIAGNOSIS — I10 Essential (primary) hypertension: Secondary | ICD-10-CM | POA: Diagnosis not present

## 2020-01-26 DIAGNOSIS — I825Y1 Chronic embolism and thrombosis of unspecified deep veins of right proximal lower extremity: Secondary | ICD-10-CM | POA: Diagnosis not present

## 2020-01-26 DIAGNOSIS — I2609 Other pulmonary embolism with acute cor pulmonale: Secondary | ICD-10-CM | POA: Diagnosis not present

## 2020-01-26 DIAGNOSIS — E1140 Type 2 diabetes mellitus with diabetic neuropathy, unspecified: Secondary | ICD-10-CM | POA: Diagnosis not present

## 2020-01-26 DIAGNOSIS — M6281 Muscle weakness (generalized): Secondary | ICD-10-CM | POA: Diagnosis not present

## 2020-01-26 DIAGNOSIS — Z86711 Personal history of pulmonary embolism: Secondary | ICD-10-CM | POA: Diagnosis not present

## 2020-01-26 DIAGNOSIS — G4700 Insomnia, unspecified: Secondary | ICD-10-CM | POA: Diagnosis not present

## 2020-01-26 DIAGNOSIS — G629 Polyneuropathy, unspecified: Secondary | ICD-10-CM | POA: Diagnosis not present

## 2020-01-26 DIAGNOSIS — I739 Peripheral vascular disease, unspecified: Secondary | ICD-10-CM | POA: Diagnosis not present

## 2020-01-26 DIAGNOSIS — Z992 Dependence on renal dialysis: Secondary | ICD-10-CM | POA: Diagnosis not present

## 2020-01-26 DIAGNOSIS — K219 Gastro-esophageal reflux disease without esophagitis: Secondary | ICD-10-CM | POA: Diagnosis not present

## 2020-01-26 DIAGNOSIS — I482 Chronic atrial fibrillation, unspecified: Secondary | ICD-10-CM | POA: Diagnosis not present

## 2020-01-26 DIAGNOSIS — N186 End stage renal disease: Secondary | ICD-10-CM | POA: Diagnosis not present

## 2020-01-26 DIAGNOSIS — I70208 Unspecified atherosclerosis of native arteries of extremities, other extremity: Secondary | ICD-10-CM | POA: Diagnosis not present

## 2020-01-26 DIAGNOSIS — R269 Unspecified abnormalities of gait and mobility: Secondary | ICD-10-CM | POA: Diagnosis not present

## 2020-01-26 DIAGNOSIS — J449 Chronic obstructive pulmonary disease, unspecified: Secondary | ICD-10-CM | POA: Diagnosis not present

## 2020-01-26 DIAGNOSIS — I4891 Unspecified atrial fibrillation: Secondary | ICD-10-CM | POA: Diagnosis not present

## 2020-01-26 DIAGNOSIS — R0602 Shortness of breath: Secondary | ICD-10-CM | POA: Diagnosis not present

## 2020-01-26 DIAGNOSIS — I2782 Chronic pulmonary embolism: Secondary | ICD-10-CM | POA: Diagnosis not present

## 2020-01-26 DIAGNOSIS — Z9111 Patient's noncompliance with dietary regimen: Secondary | ICD-10-CM | POA: Diagnosis not present

## 2020-01-26 DIAGNOSIS — I6350 Cerebral infarction due to unspecified occlusion or stenosis of unspecified cerebral artery: Secondary | ICD-10-CM | POA: Diagnosis not present

## 2020-01-26 DIAGNOSIS — F331 Major depressive disorder, recurrent, moderate: Secondary | ICD-10-CM | POA: Diagnosis not present

## 2020-01-26 LAB — BASIC METABOLIC PANEL
BUN: 36 — AB (ref 4–21)
CO2: 25 — AB (ref 13–22)
Chloride: 101 (ref 99–108)
Creatinine: 5.7 — AB (ref 0.6–1.3)
Glucose: 176
Potassium: 4.4 (ref 3.4–5.3)
Sodium: 135 — AB (ref 137–147)

## 2020-01-26 LAB — COMPREHENSIVE METABOLIC PANEL: Calcium: 8.6 — AB (ref 8.7–10.7)

## 2020-01-27 ENCOUNTER — Encounter: Payer: Self-pay | Admitting: Family Medicine

## 2020-01-27 DIAGNOSIS — N186 End stage renal disease: Secondary | ICD-10-CM | POA: Diagnosis not present

## 2020-01-27 DIAGNOSIS — I6350 Cerebral infarction due to unspecified occlusion or stenosis of unspecified cerebral artery: Secondary | ICD-10-CM | POA: Diagnosis not present

## 2020-01-27 DIAGNOSIS — D899 Disorder involving the immune mechanism, unspecified: Secondary | ICD-10-CM | POA: Diagnosis not present

## 2020-01-27 DIAGNOSIS — I70208 Unspecified atherosclerosis of native arteries of extremities, other extremity: Secondary | ICD-10-CM | POA: Diagnosis not present

## 2020-01-27 DIAGNOSIS — Z992 Dependence on renal dialysis: Secondary | ICD-10-CM | POA: Diagnosis not present

## 2020-01-27 DIAGNOSIS — D509 Iron deficiency anemia, unspecified: Secondary | ICD-10-CM | POA: Diagnosis not present

## 2020-01-27 DIAGNOSIS — J449 Chronic obstructive pulmonary disease, unspecified: Secondary | ICD-10-CM | POA: Diagnosis not present

## 2020-01-27 DIAGNOSIS — E1140 Type 2 diabetes mellitus with diabetic neuropathy, unspecified: Secondary | ICD-10-CM | POA: Diagnosis not present

## 2020-01-27 DIAGNOSIS — N2581 Secondary hyperparathyroidism of renal origin: Secondary | ICD-10-CM | POA: Diagnosis not present

## 2020-01-28 DIAGNOSIS — I70208 Unspecified atherosclerosis of native arteries of extremities, other extremity: Secondary | ICD-10-CM | POA: Diagnosis not present

## 2020-01-28 DIAGNOSIS — G4700 Insomnia, unspecified: Secondary | ICD-10-CM | POA: Diagnosis not present

## 2020-01-28 DIAGNOSIS — I6350 Cerebral infarction due to unspecified occlusion or stenosis of unspecified cerebral artery: Secondary | ICD-10-CM | POA: Diagnosis not present

## 2020-01-28 DIAGNOSIS — Z992 Dependence on renal dialysis: Secondary | ICD-10-CM | POA: Diagnosis not present

## 2020-01-28 DIAGNOSIS — I10 Essential (primary) hypertension: Secondary | ICD-10-CM | POA: Diagnosis not present

## 2020-01-28 DIAGNOSIS — I2609 Other pulmonary embolism with acute cor pulmonale: Secondary | ICD-10-CM | POA: Diagnosis not present

## 2020-01-28 DIAGNOSIS — N186 End stage renal disease: Secondary | ICD-10-CM | POA: Diagnosis not present

## 2020-01-28 DIAGNOSIS — J449 Chronic obstructive pulmonary disease, unspecified: Secondary | ICD-10-CM | POA: Diagnosis not present

## 2020-01-28 DIAGNOSIS — Z86711 Personal history of pulmonary embolism: Secondary | ICD-10-CM | POA: Diagnosis not present

## 2020-01-28 DIAGNOSIS — E118 Type 2 diabetes mellitus with unspecified complications: Secondary | ICD-10-CM | POA: Diagnosis not present

## 2020-01-28 DIAGNOSIS — I639 Cerebral infarction, unspecified: Secondary | ICD-10-CM | POA: Diagnosis not present

## 2020-01-28 DIAGNOSIS — E1140 Type 2 diabetes mellitus with diabetic neuropathy, unspecified: Secondary | ICD-10-CM | POA: Diagnosis not present

## 2020-01-28 DIAGNOSIS — I4891 Unspecified atrial fibrillation: Secondary | ICD-10-CM | POA: Diagnosis not present

## 2020-01-28 DIAGNOSIS — Z5181 Encounter for therapeutic drug level monitoring: Secondary | ICD-10-CM | POA: Diagnosis not present

## 2020-01-28 DIAGNOSIS — F339 Major depressive disorder, recurrent, unspecified: Secondary | ICD-10-CM | POA: Diagnosis not present

## 2020-01-29 DIAGNOSIS — E1140 Type 2 diabetes mellitus with diabetic neuropathy, unspecified: Secondary | ICD-10-CM | POA: Diagnosis not present

## 2020-01-29 DIAGNOSIS — I70208 Unspecified atherosclerosis of native arteries of extremities, other extremity: Secondary | ICD-10-CM | POA: Diagnosis not present

## 2020-01-29 DIAGNOSIS — J449 Chronic obstructive pulmonary disease, unspecified: Secondary | ICD-10-CM | POA: Diagnosis not present

## 2020-01-29 DIAGNOSIS — D509 Iron deficiency anemia, unspecified: Secondary | ICD-10-CM | POA: Diagnosis not present

## 2020-01-29 DIAGNOSIS — I6350 Cerebral infarction due to unspecified occlusion or stenosis of unspecified cerebral artery: Secondary | ICD-10-CM | POA: Diagnosis not present

## 2020-01-29 DIAGNOSIS — D899 Disorder involving the immune mechanism, unspecified: Secondary | ICD-10-CM | POA: Diagnosis not present

## 2020-01-29 DIAGNOSIS — R791 Abnormal coagulation profile: Secondary | ICD-10-CM | POA: Diagnosis not present

## 2020-01-29 DIAGNOSIS — N2581 Secondary hyperparathyroidism of renal origin: Secondary | ICD-10-CM | POA: Diagnosis not present

## 2020-01-29 DIAGNOSIS — N186 End stage renal disease: Secondary | ICD-10-CM | POA: Diagnosis not present

## 2020-01-29 DIAGNOSIS — Z992 Dependence on renal dialysis: Secondary | ICD-10-CM | POA: Diagnosis not present

## 2020-02-01 DIAGNOSIS — I70208 Unspecified atherosclerosis of native arteries of extremities, other extremity: Secondary | ICD-10-CM | POA: Diagnosis not present

## 2020-02-01 DIAGNOSIS — D509 Iron deficiency anemia, unspecified: Secondary | ICD-10-CM | POA: Diagnosis not present

## 2020-02-01 DIAGNOSIS — Z5181 Encounter for therapeutic drug level monitoring: Secondary | ICD-10-CM | POA: Diagnosis not present

## 2020-02-01 DIAGNOSIS — N186 End stage renal disease: Secondary | ICD-10-CM | POA: Diagnosis not present

## 2020-02-01 DIAGNOSIS — D899 Disorder involving the immune mechanism, unspecified: Secondary | ICD-10-CM | POA: Diagnosis not present

## 2020-02-01 DIAGNOSIS — Z992 Dependence on renal dialysis: Secondary | ICD-10-CM | POA: Diagnosis not present

## 2020-02-01 DIAGNOSIS — E1140 Type 2 diabetes mellitus with diabetic neuropathy, unspecified: Secondary | ICD-10-CM | POA: Diagnosis not present

## 2020-02-01 DIAGNOSIS — N2581 Secondary hyperparathyroidism of renal origin: Secondary | ICD-10-CM | POA: Diagnosis not present

## 2020-02-01 DIAGNOSIS — J449 Chronic obstructive pulmonary disease, unspecified: Secondary | ICD-10-CM | POA: Diagnosis not present

## 2020-02-01 DIAGNOSIS — I6350 Cerebral infarction due to unspecified occlusion or stenosis of unspecified cerebral artery: Secondary | ICD-10-CM | POA: Diagnosis not present

## 2020-02-02 DIAGNOSIS — Z992 Dependence on renal dialysis: Secondary | ICD-10-CM | POA: Diagnosis not present

## 2020-02-02 DIAGNOSIS — G4700 Insomnia, unspecified: Secondary | ICD-10-CM | POA: Diagnosis not present

## 2020-02-02 DIAGNOSIS — E1140 Type 2 diabetes mellitus with diabetic neuropathy, unspecified: Secondary | ICD-10-CM | POA: Diagnosis not present

## 2020-02-02 DIAGNOSIS — I2609 Other pulmonary embolism with acute cor pulmonale: Secondary | ICD-10-CM | POA: Diagnosis not present

## 2020-02-02 DIAGNOSIS — I10 Essential (primary) hypertension: Secondary | ICD-10-CM | POA: Diagnosis not present

## 2020-02-02 DIAGNOSIS — I70208 Unspecified atherosclerosis of native arteries of extremities, other extremity: Secondary | ICD-10-CM | POA: Diagnosis not present

## 2020-02-02 DIAGNOSIS — I4891 Unspecified atrial fibrillation: Secondary | ICD-10-CM | POA: Diagnosis not present

## 2020-02-02 DIAGNOSIS — J449 Chronic obstructive pulmonary disease, unspecified: Secondary | ICD-10-CM | POA: Diagnosis not present

## 2020-02-02 DIAGNOSIS — F339 Major depressive disorder, recurrent, unspecified: Secondary | ICD-10-CM | POA: Diagnosis not present

## 2020-02-02 DIAGNOSIS — N186 End stage renal disease: Secondary | ICD-10-CM | POA: Diagnosis not present

## 2020-02-02 DIAGNOSIS — M6281 Muscle weakness (generalized): Secondary | ICD-10-CM | POA: Diagnosis not present

## 2020-02-02 DIAGNOSIS — I6350 Cerebral infarction due to unspecified occlusion or stenosis of unspecified cerebral artery: Secondary | ICD-10-CM | POA: Diagnosis not present

## 2020-02-02 DIAGNOSIS — E118 Type 2 diabetes mellitus with unspecified complications: Secondary | ICD-10-CM | POA: Diagnosis not present

## 2020-02-02 DIAGNOSIS — I482 Chronic atrial fibrillation, unspecified: Secondary | ICD-10-CM | POA: Diagnosis not present

## 2020-02-02 DIAGNOSIS — I639 Cerebral infarction, unspecified: Secondary | ICD-10-CM | POA: Diagnosis not present

## 2020-02-03 DIAGNOSIS — I6350 Cerebral infarction due to unspecified occlusion or stenosis of unspecified cerebral artery: Secondary | ICD-10-CM | POA: Diagnosis not present

## 2020-02-03 DIAGNOSIS — I70208 Unspecified atherosclerosis of native arteries of extremities, other extremity: Secondary | ICD-10-CM | POA: Diagnosis not present

## 2020-02-03 DIAGNOSIS — N2581 Secondary hyperparathyroidism of renal origin: Secondary | ICD-10-CM | POA: Diagnosis not present

## 2020-02-03 DIAGNOSIS — Z992 Dependence on renal dialysis: Secondary | ICD-10-CM | POA: Diagnosis not present

## 2020-02-03 DIAGNOSIS — E1140 Type 2 diabetes mellitus with diabetic neuropathy, unspecified: Secondary | ICD-10-CM | POA: Diagnosis not present

## 2020-02-03 DIAGNOSIS — N186 End stage renal disease: Secondary | ICD-10-CM | POA: Diagnosis not present

## 2020-02-03 DIAGNOSIS — D899 Disorder involving the immune mechanism, unspecified: Secondary | ICD-10-CM | POA: Diagnosis not present

## 2020-02-03 DIAGNOSIS — D509 Iron deficiency anemia, unspecified: Secondary | ICD-10-CM | POA: Diagnosis not present

## 2020-02-03 DIAGNOSIS — J449 Chronic obstructive pulmonary disease, unspecified: Secondary | ICD-10-CM | POA: Diagnosis not present

## 2020-02-04 DIAGNOSIS — Z5181 Encounter for therapeutic drug level monitoring: Secondary | ICD-10-CM | POA: Diagnosis not present

## 2020-02-04 DIAGNOSIS — I70208 Unspecified atherosclerosis of native arteries of extremities, other extremity: Secondary | ICD-10-CM | POA: Diagnosis not present

## 2020-02-04 DIAGNOSIS — Z992 Dependence on renal dialysis: Secondary | ICD-10-CM | POA: Diagnosis not present

## 2020-02-04 DIAGNOSIS — E1140 Type 2 diabetes mellitus with diabetic neuropathy, unspecified: Secondary | ICD-10-CM | POA: Diagnosis not present

## 2020-02-04 DIAGNOSIS — I6350 Cerebral infarction due to unspecified occlusion or stenosis of unspecified cerebral artery: Secondary | ICD-10-CM | POA: Diagnosis not present

## 2020-02-04 DIAGNOSIS — N186 End stage renal disease: Secondary | ICD-10-CM | POA: Diagnosis not present

## 2020-02-04 DIAGNOSIS — J449 Chronic obstructive pulmonary disease, unspecified: Secondary | ICD-10-CM | POA: Diagnosis not present

## 2020-02-05 ENCOUNTER — Telehealth (INDEPENDENT_AMBULATORY_CARE_PROVIDER_SITE_OTHER): Payer: Self-pay

## 2020-02-05 DIAGNOSIS — I6350 Cerebral infarction due to unspecified occlusion or stenosis of unspecified cerebral artery: Secondary | ICD-10-CM | POA: Diagnosis not present

## 2020-02-05 DIAGNOSIS — D509 Iron deficiency anemia, unspecified: Secondary | ICD-10-CM | POA: Diagnosis not present

## 2020-02-05 DIAGNOSIS — R600 Localized edema: Secondary | ICD-10-CM | POA: Diagnosis not present

## 2020-02-05 DIAGNOSIS — M6281 Muscle weakness (generalized): Secondary | ICD-10-CM | POA: Diagnosis not present

## 2020-02-05 DIAGNOSIS — I482 Chronic atrial fibrillation, unspecified: Secondary | ICD-10-CM | POA: Diagnosis not present

## 2020-02-05 DIAGNOSIS — E118 Type 2 diabetes mellitus with unspecified complications: Secondary | ICD-10-CM | POA: Diagnosis not present

## 2020-02-05 DIAGNOSIS — F339 Major depressive disorder, recurrent, unspecified: Secondary | ICD-10-CM | POA: Diagnosis not present

## 2020-02-05 DIAGNOSIS — Z992 Dependence on renal dialysis: Secondary | ICD-10-CM | POA: Diagnosis not present

## 2020-02-05 DIAGNOSIS — I70208 Unspecified atherosclerosis of native arteries of extremities, other extremity: Secondary | ICD-10-CM | POA: Diagnosis not present

## 2020-02-05 DIAGNOSIS — N2581 Secondary hyperparathyroidism of renal origin: Secondary | ICD-10-CM | POA: Diagnosis not present

## 2020-02-05 DIAGNOSIS — G4700 Insomnia, unspecified: Secondary | ICD-10-CM | POA: Diagnosis not present

## 2020-02-05 DIAGNOSIS — I4891 Unspecified atrial fibrillation: Secondary | ICD-10-CM | POA: Diagnosis not present

## 2020-02-05 DIAGNOSIS — J449 Chronic obstructive pulmonary disease, unspecified: Secondary | ICD-10-CM | POA: Diagnosis not present

## 2020-02-05 DIAGNOSIS — E1140 Type 2 diabetes mellitus with diabetic neuropathy, unspecified: Secondary | ICD-10-CM | POA: Diagnosis not present

## 2020-02-05 DIAGNOSIS — I2609 Other pulmonary embolism with acute cor pulmonale: Secondary | ICD-10-CM | POA: Diagnosis not present

## 2020-02-05 DIAGNOSIS — I10 Essential (primary) hypertension: Secondary | ICD-10-CM | POA: Diagnosis not present

## 2020-02-05 DIAGNOSIS — I639 Cerebral infarction, unspecified: Secondary | ICD-10-CM | POA: Diagnosis not present

## 2020-02-05 DIAGNOSIS — D899 Disorder involving the immune mechanism, unspecified: Secondary | ICD-10-CM | POA: Diagnosis not present

## 2020-02-05 DIAGNOSIS — N186 End stage renal disease: Secondary | ICD-10-CM | POA: Diagnosis not present

## 2020-02-05 NOTE — Telephone Encounter (Signed)
Patient left a voicemail informing that he is ready to reschedule his vein mapping. The patient informed that he has being seeing Dr Radene Knee and was already check to see if his veins were good and was schedule for a procedure but due to a minor stroke he was not able to get the procedure done. The patient informed that he has a temporary cathter in leg and is having a hard time for someone to do his dialysis. Patient was made aware to contact his dialysis and they will reach out to our office.

## 2020-02-08 DIAGNOSIS — R791 Abnormal coagulation profile: Secondary | ICD-10-CM | POA: Diagnosis not present

## 2020-02-08 DIAGNOSIS — I825Y1 Chronic embolism and thrombosis of unspecified deep veins of right proximal lower extremity: Secondary | ICD-10-CM | POA: Diagnosis not present

## 2020-02-08 DIAGNOSIS — S42301D Unspecified fracture of shaft of humerus, right arm, subsequent encounter for fracture with routine healing: Secondary | ICD-10-CM | POA: Diagnosis not present

## 2020-02-08 DIAGNOSIS — M6281 Muscle weakness (generalized): Secondary | ICD-10-CM | POA: Diagnosis not present

## 2020-02-08 DIAGNOSIS — I10 Essential (primary) hypertension: Secondary | ICD-10-CM | POA: Diagnosis not present

## 2020-02-08 DIAGNOSIS — E1140 Type 2 diabetes mellitus with diabetic neuropathy, unspecified: Secondary | ICD-10-CM | POA: Diagnosis not present

## 2020-02-08 DIAGNOSIS — G4700 Insomnia, unspecified: Secondary | ICD-10-CM | POA: Diagnosis not present

## 2020-02-08 DIAGNOSIS — D509 Iron deficiency anemia, unspecified: Secondary | ICD-10-CM | POA: Diagnosis not present

## 2020-02-08 DIAGNOSIS — I509 Heart failure, unspecified: Secondary | ICD-10-CM | POA: Diagnosis not present

## 2020-02-08 DIAGNOSIS — G4733 Obstructive sleep apnea (adult) (pediatric): Secondary | ICD-10-CM | POA: Diagnosis not present

## 2020-02-08 DIAGNOSIS — I739 Peripheral vascular disease, unspecified: Secondary | ICD-10-CM | POA: Diagnosis not present

## 2020-02-08 DIAGNOSIS — I4891 Unspecified atrial fibrillation: Secondary | ICD-10-CM | POA: Diagnosis not present

## 2020-02-08 DIAGNOSIS — I6350 Cerebral infarction due to unspecified occlusion or stenosis of unspecified cerebral artery: Secondary | ICD-10-CM | POA: Diagnosis not present

## 2020-02-08 DIAGNOSIS — Z992 Dependence on renal dialysis: Secondary | ICD-10-CM | POA: Diagnosis not present

## 2020-02-08 DIAGNOSIS — Z8673 Personal history of transient ischemic attack (TIA), and cerebral infarction without residual deficits: Secondary | ICD-10-CM | POA: Diagnosis not present

## 2020-02-08 DIAGNOSIS — Z9111 Patient's noncompliance with dietary regimen: Secondary | ICD-10-CM | POA: Diagnosis not present

## 2020-02-08 DIAGNOSIS — I70208 Unspecified atherosclerosis of native arteries of extremities, other extremity: Secondary | ICD-10-CM | POA: Diagnosis not present

## 2020-02-08 DIAGNOSIS — G609 Hereditary and idiopathic neuropathy, unspecified: Secondary | ICD-10-CM | POA: Diagnosis not present

## 2020-02-08 DIAGNOSIS — E039 Hypothyroidism, unspecified: Secondary | ICD-10-CM | POA: Diagnosis not present

## 2020-02-08 DIAGNOSIS — I2782 Chronic pulmonary embolism: Secondary | ICD-10-CM | POA: Diagnosis not present

## 2020-02-08 DIAGNOSIS — N186 End stage renal disease: Secondary | ICD-10-CM | POA: Diagnosis not present

## 2020-02-08 DIAGNOSIS — J449 Chronic obstructive pulmonary disease, unspecified: Secondary | ICD-10-CM | POA: Diagnosis not present

## 2020-02-08 DIAGNOSIS — R0602 Shortness of breath: Secondary | ICD-10-CM | POA: Diagnosis not present

## 2020-02-08 DIAGNOSIS — Z86711 Personal history of pulmonary embolism: Secondary | ICD-10-CM | POA: Diagnosis not present

## 2020-02-08 DIAGNOSIS — K219 Gastro-esophageal reflux disease without esophagitis: Secondary | ICD-10-CM | POA: Diagnosis not present

## 2020-02-08 DIAGNOSIS — R269 Unspecified abnormalities of gait and mobility: Secondary | ICD-10-CM | POA: Diagnosis not present

## 2020-02-08 DIAGNOSIS — R2681 Unsteadiness on feet: Secondary | ICD-10-CM | POA: Diagnosis not present

## 2020-02-09 ENCOUNTER — Telehealth (INDEPENDENT_AMBULATORY_CARE_PROVIDER_SITE_OTHER): Payer: Self-pay

## 2020-02-09 DIAGNOSIS — F339 Major depressive disorder, recurrent, unspecified: Secondary | ICD-10-CM | POA: Diagnosis not present

## 2020-02-09 DIAGNOSIS — E1140 Type 2 diabetes mellitus with diabetic neuropathy, unspecified: Secondary | ICD-10-CM | POA: Diagnosis not present

## 2020-02-09 DIAGNOSIS — I6350 Cerebral infarction due to unspecified occlusion or stenosis of unspecified cerebral artery: Secondary | ICD-10-CM | POA: Diagnosis not present

## 2020-02-09 DIAGNOSIS — I2609 Other pulmonary embolism with acute cor pulmonale: Secondary | ICD-10-CM | POA: Diagnosis not present

## 2020-02-09 DIAGNOSIS — N186 End stage renal disease: Secondary | ICD-10-CM | POA: Diagnosis not present

## 2020-02-09 DIAGNOSIS — I70208 Unspecified atherosclerosis of native arteries of extremities, other extremity: Secondary | ICD-10-CM | POA: Diagnosis not present

## 2020-02-09 DIAGNOSIS — S42301D Unspecified fracture of shaft of humerus, right arm, subsequent encounter for fracture with routine healing: Secondary | ICD-10-CM | POA: Diagnosis not present

## 2020-02-09 DIAGNOSIS — E118 Type 2 diabetes mellitus with unspecified complications: Secondary | ICD-10-CM | POA: Diagnosis not present

## 2020-02-09 DIAGNOSIS — J449 Chronic obstructive pulmonary disease, unspecified: Secondary | ICD-10-CM | POA: Diagnosis not present

## 2020-02-09 DIAGNOSIS — G629 Polyneuropathy, unspecified: Secondary | ICD-10-CM | POA: Diagnosis not present

## 2020-02-09 DIAGNOSIS — I2699 Other pulmonary embolism without acute cor pulmonale: Secondary | ICD-10-CM | POA: Diagnosis not present

## 2020-02-09 DIAGNOSIS — K219 Gastro-esophageal reflux disease without esophagitis: Secondary | ICD-10-CM | POA: Diagnosis not present

## 2020-02-09 DIAGNOSIS — Z992 Dependence on renal dialysis: Secondary | ICD-10-CM | POA: Diagnosis not present

## 2020-02-09 DIAGNOSIS — I639 Cerebral infarction, unspecified: Secondary | ICD-10-CM | POA: Diagnosis not present

## 2020-02-09 DIAGNOSIS — M6281 Muscle weakness (generalized): Secondary | ICD-10-CM | POA: Diagnosis not present

## 2020-02-09 DIAGNOSIS — E785 Hyperlipidemia, unspecified: Secondary | ICD-10-CM | POA: Diagnosis not present

## 2020-02-09 DIAGNOSIS — I4891 Unspecified atrial fibrillation: Secondary | ICD-10-CM | POA: Diagnosis not present

## 2020-02-09 DIAGNOSIS — I10 Essential (primary) hypertension: Secondary | ICD-10-CM | POA: Diagnosis not present

## 2020-02-09 NOTE — Telephone Encounter (Signed)
I received a message from the patient earlier and returned his call. Per the patient he will be released as soon as his papers are signed and he wants to have his surgery scheduled with Dr. Lucky Cowboy or Dr. Delana Meyer.  Patient was scheduled 01/13/20 with Dr. Delana Meyer for a shuntogram which was canceled to the patient no showing for the procedure. Patient states he does not have a dialysis center to receive dialysis because he does not have a fistula. I attempted to explain that having his permcath is sufficient to have dialysis until a permanent access is placed but he has to have a place to do so. Patient stated he will go to the hospital ER and have dialysis when he is released and he will be in the hospital and have dialysis when his surgery is complete. I explained that he would not stay overnight in the hospital after a permanent access placement and he was adamant that he will stay so that he could have dialysis and because he would not be able to use his arms. I called the facility at [facility] in [place] and spoke with Antoine Primas his nurse there and she informed me the patient is not being released, she stated that he could leave anytime he chooses but it would be AMA. Patient does not have a dialysis center to attend, does not have a PCP or a way to have homehealth as well. At this time the patient is still a resident of [facility].

## 2020-02-10 DIAGNOSIS — D509 Iron deficiency anemia, unspecified: Secondary | ICD-10-CM | POA: Diagnosis not present

## 2020-02-10 DIAGNOSIS — N186 End stage renal disease: Secondary | ICD-10-CM | POA: Diagnosis not present

## 2020-02-10 DIAGNOSIS — E1140 Type 2 diabetes mellitus with diabetic neuropathy, unspecified: Secondary | ICD-10-CM | POA: Diagnosis not present

## 2020-02-10 DIAGNOSIS — S42301D Unspecified fracture of shaft of humerus, right arm, subsequent encounter for fracture with routine healing: Secondary | ICD-10-CM | POA: Diagnosis not present

## 2020-02-10 DIAGNOSIS — I70208 Unspecified atherosclerosis of native arteries of extremities, other extremity: Secondary | ICD-10-CM | POA: Diagnosis not present

## 2020-02-10 DIAGNOSIS — J449 Chronic obstructive pulmonary disease, unspecified: Secondary | ICD-10-CM | POA: Diagnosis not present

## 2020-02-10 DIAGNOSIS — I6350 Cerebral infarction due to unspecified occlusion or stenosis of unspecified cerebral artery: Secondary | ICD-10-CM | POA: Diagnosis not present

## 2020-02-11 DIAGNOSIS — N186 End stage renal disease: Secondary | ICD-10-CM | POA: Diagnosis not present

## 2020-02-11 DIAGNOSIS — S42301D Unspecified fracture of shaft of humerus, right arm, subsequent encounter for fracture with routine healing: Secondary | ICD-10-CM | POA: Diagnosis not present

## 2020-02-11 DIAGNOSIS — I6350 Cerebral infarction due to unspecified occlusion or stenosis of unspecified cerebral artery: Secondary | ICD-10-CM | POA: Diagnosis not present

## 2020-02-11 DIAGNOSIS — E1140 Type 2 diabetes mellitus with diabetic neuropathy, unspecified: Secondary | ICD-10-CM | POA: Diagnosis not present

## 2020-02-11 DIAGNOSIS — J449 Chronic obstructive pulmonary disease, unspecified: Secondary | ICD-10-CM | POA: Diagnosis not present

## 2020-02-11 DIAGNOSIS — I70208 Unspecified atherosclerosis of native arteries of extremities, other extremity: Secondary | ICD-10-CM | POA: Diagnosis not present

## 2020-02-11 DIAGNOSIS — I10 Essential (primary) hypertension: Secondary | ICD-10-CM | POA: Diagnosis not present

## 2020-02-11 DIAGNOSIS — Z5181 Encounter for therapeutic drug level monitoring: Secondary | ICD-10-CM | POA: Diagnosis not present

## 2020-02-12 DIAGNOSIS — E118 Type 2 diabetes mellitus with unspecified complications: Secondary | ICD-10-CM | POA: Diagnosis not present

## 2020-02-12 DIAGNOSIS — E1140 Type 2 diabetes mellitus with diabetic neuropathy, unspecified: Secondary | ICD-10-CM | POA: Diagnosis not present

## 2020-02-12 DIAGNOSIS — I2609 Other pulmonary embolism with acute cor pulmonale: Secondary | ICD-10-CM | POA: Diagnosis not present

## 2020-02-12 DIAGNOSIS — Z9111 Patient's noncompliance with dietary regimen: Secondary | ICD-10-CM | POA: Diagnosis not present

## 2020-02-12 DIAGNOSIS — R600 Localized edema: Secondary | ICD-10-CM | POA: Diagnosis not present

## 2020-02-12 DIAGNOSIS — D509 Iron deficiency anemia, unspecified: Secondary | ICD-10-CM | POA: Diagnosis not present

## 2020-02-12 DIAGNOSIS — I6350 Cerebral infarction due to unspecified occlusion or stenosis of unspecified cerebral artery: Secondary | ICD-10-CM | POA: Diagnosis not present

## 2020-02-12 DIAGNOSIS — I70208 Unspecified atherosclerosis of native arteries of extremities, other extremity: Secondary | ICD-10-CM | POA: Diagnosis not present

## 2020-02-12 DIAGNOSIS — I639 Cerebral infarction, unspecified: Secondary | ICD-10-CM | POA: Diagnosis not present

## 2020-02-12 DIAGNOSIS — S42301D Unspecified fracture of shaft of humerus, right arm, subsequent encounter for fracture with routine healing: Secondary | ICD-10-CM | POA: Diagnosis not present

## 2020-02-12 DIAGNOSIS — Z992 Dependence on renal dialysis: Secondary | ICD-10-CM | POA: Diagnosis not present

## 2020-02-12 DIAGNOSIS — J449 Chronic obstructive pulmonary disease, unspecified: Secondary | ICD-10-CM | POA: Diagnosis not present

## 2020-02-12 DIAGNOSIS — R791 Abnormal coagulation profile: Secondary | ICD-10-CM | POA: Diagnosis not present

## 2020-02-12 DIAGNOSIS — N186 End stage renal disease: Secondary | ICD-10-CM | POA: Diagnosis not present

## 2020-02-12 DIAGNOSIS — M6281 Muscle weakness (generalized): Secondary | ICD-10-CM | POA: Diagnosis not present

## 2020-02-12 DIAGNOSIS — I482 Chronic atrial fibrillation, unspecified: Secondary | ICD-10-CM | POA: Diagnosis not present

## 2020-02-12 DIAGNOSIS — I4891 Unspecified atrial fibrillation: Secondary | ICD-10-CM | POA: Diagnosis not present

## 2020-02-12 DIAGNOSIS — I10 Essential (primary) hypertension: Secondary | ICD-10-CM | POA: Diagnosis not present

## 2020-02-15 DIAGNOSIS — J449 Chronic obstructive pulmonary disease, unspecified: Secondary | ICD-10-CM | POA: Diagnosis not present

## 2020-02-15 DIAGNOSIS — D509 Iron deficiency anemia, unspecified: Secondary | ICD-10-CM | POA: Diagnosis not present

## 2020-02-15 DIAGNOSIS — I6350 Cerebral infarction due to unspecified occlusion or stenosis of unspecified cerebral artery: Secondary | ICD-10-CM | POA: Diagnosis not present

## 2020-02-15 DIAGNOSIS — N186 End stage renal disease: Secondary | ICD-10-CM | POA: Diagnosis not present

## 2020-02-15 DIAGNOSIS — I70208 Unspecified atherosclerosis of native arteries of extremities, other extremity: Secondary | ICD-10-CM | POA: Diagnosis not present

## 2020-02-15 DIAGNOSIS — S42301D Unspecified fracture of shaft of humerus, right arm, subsequent encounter for fracture with routine healing: Secondary | ICD-10-CM | POA: Diagnosis not present

## 2020-02-15 DIAGNOSIS — E1140 Type 2 diabetes mellitus with diabetic neuropathy, unspecified: Secondary | ICD-10-CM | POA: Diagnosis not present

## 2020-02-16 DIAGNOSIS — R791 Abnormal coagulation profile: Secondary | ICD-10-CM | POA: Diagnosis not present

## 2020-02-16 DIAGNOSIS — E1140 Type 2 diabetes mellitus with diabetic neuropathy, unspecified: Secondary | ICD-10-CM | POA: Diagnosis not present

## 2020-02-16 DIAGNOSIS — J449 Chronic obstructive pulmonary disease, unspecified: Secondary | ICD-10-CM | POA: Diagnosis not present

## 2020-02-16 DIAGNOSIS — I70208 Unspecified atherosclerosis of native arteries of extremities, other extremity: Secondary | ICD-10-CM | POA: Diagnosis not present

## 2020-02-16 DIAGNOSIS — I6350 Cerebral infarction due to unspecified occlusion or stenosis of unspecified cerebral artery: Secondary | ICD-10-CM | POA: Diagnosis not present

## 2020-02-16 DIAGNOSIS — N186 End stage renal disease: Secondary | ICD-10-CM | POA: Diagnosis not present

## 2020-02-16 DIAGNOSIS — S42301D Unspecified fracture of shaft of humerus, right arm, subsequent encounter for fracture with routine healing: Secondary | ICD-10-CM | POA: Diagnosis not present

## 2020-02-17 DIAGNOSIS — J449 Chronic obstructive pulmonary disease, unspecified: Secondary | ICD-10-CM | POA: Diagnosis not present

## 2020-02-17 DIAGNOSIS — N186 End stage renal disease: Secondary | ICD-10-CM | POA: Diagnosis not present

## 2020-02-17 DIAGNOSIS — I6350 Cerebral infarction due to unspecified occlusion or stenosis of unspecified cerebral artery: Secondary | ICD-10-CM | POA: Diagnosis not present

## 2020-02-17 DIAGNOSIS — S42301D Unspecified fracture of shaft of humerus, right arm, subsequent encounter for fracture with routine healing: Secondary | ICD-10-CM | POA: Diagnosis not present

## 2020-02-17 DIAGNOSIS — I70208 Unspecified atherosclerosis of native arteries of extremities, other extremity: Secondary | ICD-10-CM | POA: Diagnosis not present

## 2020-02-17 DIAGNOSIS — Z5181 Encounter for therapeutic drug level monitoring: Secondary | ICD-10-CM | POA: Diagnosis not present

## 2020-02-17 DIAGNOSIS — E1140 Type 2 diabetes mellitus with diabetic neuropathy, unspecified: Secondary | ICD-10-CM | POA: Diagnosis not present

## 2020-02-18 DIAGNOSIS — S42301D Unspecified fracture of shaft of humerus, right arm, subsequent encounter for fracture with routine healing: Secondary | ICD-10-CM | POA: Diagnosis not present

## 2020-02-18 DIAGNOSIS — J449 Chronic obstructive pulmonary disease, unspecified: Secondary | ICD-10-CM | POA: Diagnosis not present

## 2020-02-18 DIAGNOSIS — I70208 Unspecified atherosclerosis of native arteries of extremities, other extremity: Secondary | ICD-10-CM | POA: Diagnosis not present

## 2020-02-18 DIAGNOSIS — E1140 Type 2 diabetes mellitus with diabetic neuropathy, unspecified: Secondary | ICD-10-CM | POA: Diagnosis not present

## 2020-02-18 DIAGNOSIS — N186 End stage renal disease: Secondary | ICD-10-CM | POA: Diagnosis not present

## 2020-02-18 DIAGNOSIS — I6350 Cerebral infarction due to unspecified occlusion or stenosis of unspecified cerebral artery: Secondary | ICD-10-CM | POA: Diagnosis not present

## 2020-02-19 DIAGNOSIS — E1140 Type 2 diabetes mellitus with diabetic neuropathy, unspecified: Secondary | ICD-10-CM | POA: Diagnosis not present

## 2020-02-19 DIAGNOSIS — I6350 Cerebral infarction due to unspecified occlusion or stenosis of unspecified cerebral artery: Secondary | ICD-10-CM | POA: Diagnosis not present

## 2020-02-19 DIAGNOSIS — S42301D Unspecified fracture of shaft of humerus, right arm, subsequent encounter for fracture with routine healing: Secondary | ICD-10-CM | POA: Diagnosis not present

## 2020-02-19 DIAGNOSIS — J449 Chronic obstructive pulmonary disease, unspecified: Secondary | ICD-10-CM | POA: Diagnosis not present

## 2020-02-19 DIAGNOSIS — I70208 Unspecified atherosclerosis of native arteries of extremities, other extremity: Secondary | ICD-10-CM | POA: Diagnosis not present

## 2020-02-19 DIAGNOSIS — N186 End stage renal disease: Secondary | ICD-10-CM | POA: Diagnosis not present

## 2020-02-19 DIAGNOSIS — D509 Iron deficiency anemia, unspecified: Secondary | ICD-10-CM | POA: Diagnosis not present

## 2020-02-21 DIAGNOSIS — S42201A Unspecified fracture of upper end of right humerus, initial encounter for closed fracture: Secondary | ICD-10-CM | POA: Diagnosis not present

## 2020-02-21 DIAGNOSIS — M19011 Primary osteoarthritis, right shoulder: Secondary | ICD-10-CM | POA: Diagnosis not present

## 2020-02-21 DIAGNOSIS — R58 Hemorrhage, not elsewhere classified: Secondary | ICD-10-CM | POA: Diagnosis not present

## 2020-02-21 DIAGNOSIS — Y999 Unspecified external cause status: Secondary | ICD-10-CM | POA: Diagnosis not present

## 2020-02-21 DIAGNOSIS — J90 Pleural effusion, not elsewhere classified: Secondary | ICD-10-CM | POA: Diagnosis not present

## 2020-02-21 DIAGNOSIS — M19021 Primary osteoarthritis, right elbow: Secondary | ICD-10-CM | POA: Diagnosis not present

## 2020-02-21 DIAGNOSIS — R918 Other nonspecific abnormal finding of lung field: Secondary | ICD-10-CM | POA: Diagnosis not present

## 2020-02-21 DIAGNOSIS — S42291A Other displaced fracture of upper end of right humerus, initial encounter for closed fracture: Secondary | ICD-10-CM | POA: Diagnosis not present

## 2020-02-21 DIAGNOSIS — N186 End stage renal disease: Secondary | ICD-10-CM | POA: Diagnosis not present

## 2020-02-21 DIAGNOSIS — Z992 Dependence on renal dialysis: Secondary | ICD-10-CM | POA: Diagnosis not present

## 2020-02-21 DIAGNOSIS — F17290 Nicotine dependence, other tobacco product, uncomplicated: Secondary | ICD-10-CM | POA: Diagnosis not present

## 2020-02-21 DIAGNOSIS — Z8673 Personal history of transient ischemic attack (TIA), and cerebral infarction without residual deficits: Secondary | ICD-10-CM | POA: Diagnosis not present

## 2020-02-21 DIAGNOSIS — W19XXXA Unspecified fall, initial encounter: Secondary | ICD-10-CM | POA: Diagnosis not present

## 2020-02-21 DIAGNOSIS — I959 Hypotension, unspecified: Secondary | ICD-10-CM | POA: Diagnosis not present

## 2020-02-21 DIAGNOSIS — Z8709 Personal history of other diseases of the respiratory system: Secondary | ICD-10-CM | POA: Diagnosis not present

## 2020-02-21 DIAGNOSIS — Z8639 Personal history of other endocrine, nutritional and metabolic disease: Secondary | ICD-10-CM | POA: Diagnosis not present

## 2020-02-21 DIAGNOSIS — W1839XA Other fall on same level, initial encounter: Secondary | ICD-10-CM | POA: Diagnosis not present

## 2020-02-21 DIAGNOSIS — Z86711 Personal history of pulmonary embolism: Secondary | ICD-10-CM | POA: Diagnosis not present

## 2020-02-21 DIAGNOSIS — Z043 Encounter for examination and observation following other accident: Secondary | ICD-10-CM | POA: Diagnosis not present

## 2020-02-21 DIAGNOSIS — Z20822 Contact with and (suspected) exposure to covid-19: Secondary | ICD-10-CM | POA: Diagnosis not present

## 2020-02-21 DIAGNOSIS — Z8679 Personal history of other diseases of the circulatory system: Secondary | ICD-10-CM | POA: Diagnosis not present

## 2020-02-22 DIAGNOSIS — R791 Abnormal coagulation profile: Secondary | ICD-10-CM | POA: Diagnosis not present

## 2020-02-22 DIAGNOSIS — E1140 Type 2 diabetes mellitus with diabetic neuropathy, unspecified: Secondary | ICD-10-CM | POA: Diagnosis not present

## 2020-02-22 DIAGNOSIS — I70208 Unspecified atherosclerosis of native arteries of extremities, other extremity: Secondary | ICD-10-CM | POA: Diagnosis not present

## 2020-02-22 DIAGNOSIS — J449 Chronic obstructive pulmonary disease, unspecified: Secondary | ICD-10-CM | POA: Diagnosis not present

## 2020-02-22 DIAGNOSIS — S42301D Unspecified fracture of shaft of humerus, right arm, subsequent encounter for fracture with routine healing: Secondary | ICD-10-CM | POA: Diagnosis not present

## 2020-02-22 DIAGNOSIS — N186 End stage renal disease: Secondary | ICD-10-CM | POA: Diagnosis not present

## 2020-02-22 DIAGNOSIS — I6350 Cerebral infarction due to unspecified occlusion or stenosis of unspecified cerebral artery: Secondary | ICD-10-CM | POA: Diagnosis not present

## 2020-02-23 DIAGNOSIS — S42211A Unspecified displaced fracture of surgical neck of right humerus, initial encounter for closed fracture: Secondary | ICD-10-CM | POA: Diagnosis not present

## 2020-02-23 DIAGNOSIS — R296 Repeated falls: Secondary | ICD-10-CM | POA: Diagnosis not present

## 2020-02-23 DIAGNOSIS — I4891 Unspecified atrial fibrillation: Secondary | ICD-10-CM | POA: Diagnosis not present

## 2020-02-24 DIAGNOSIS — Z86711 Personal history of pulmonary embolism: Secondary | ICD-10-CM | POA: Diagnosis not present

## 2020-02-25 DIAGNOSIS — S42331A Displaced oblique fracture of shaft of humerus, right arm, initial encounter for closed fracture: Secondary | ICD-10-CM | POA: Diagnosis not present

## 2020-02-25 DIAGNOSIS — M19011 Primary osteoarthritis, right shoulder: Secondary | ICD-10-CM | POA: Diagnosis not present

## 2020-02-29 DIAGNOSIS — D509 Iron deficiency anemia, unspecified: Secondary | ICD-10-CM | POA: Diagnosis not present

## 2020-02-29 DIAGNOSIS — N186 End stage renal disease: Secondary | ICD-10-CM | POA: Diagnosis not present

## 2020-03-01 DIAGNOSIS — G4700 Insomnia, unspecified: Secondary | ICD-10-CM | POA: Diagnosis not present

## 2020-03-01 DIAGNOSIS — D518 Other vitamin B12 deficiency anemias: Secondary | ICD-10-CM | POA: Diagnosis not present

## 2020-03-01 DIAGNOSIS — R609 Edema, unspecified: Secondary | ICD-10-CM | POA: Diagnosis not present

## 2020-03-01 DIAGNOSIS — I509 Heart failure, unspecified: Secondary | ICD-10-CM | POA: Diagnosis not present

## 2020-03-01 DIAGNOSIS — J441 Chronic obstructive pulmonary disease with (acute) exacerbation: Secondary | ICD-10-CM | POA: Diagnosis not present

## 2020-03-01 DIAGNOSIS — Z9111 Patient's noncompliance with dietary regimen: Secondary | ICD-10-CM | POA: Diagnosis not present

## 2020-03-01 DIAGNOSIS — E119 Type 2 diabetes mellitus without complications: Secondary | ICD-10-CM | POA: Diagnosis not present

## 2020-03-01 DIAGNOSIS — I10 Essential (primary) hypertension: Secondary | ICD-10-CM | POA: Diagnosis not present

## 2020-03-01 DIAGNOSIS — E162 Hypoglycemia, unspecified: Secondary | ICD-10-CM | POA: Diagnosis not present

## 2020-03-01 DIAGNOSIS — S42211A Unspecified displaced fracture of surgical neck of right humerus, initial encounter for closed fracture: Secondary | ICD-10-CM | POA: Diagnosis not present

## 2020-03-01 DIAGNOSIS — E782 Mixed hyperlipidemia: Secondary | ICD-10-CM | POA: Diagnosis not present

## 2020-03-01 DIAGNOSIS — R600 Localized edema: Secondary | ICD-10-CM | POA: Diagnosis not present

## 2020-03-01 DIAGNOSIS — Z86711 Personal history of pulmonary embolism: Secondary | ICD-10-CM | POA: Diagnosis not present

## 2020-03-01 DIAGNOSIS — I639 Cerebral infarction, unspecified: Secondary | ICD-10-CM | POA: Diagnosis not present

## 2020-03-01 DIAGNOSIS — R791 Abnormal coagulation profile: Secondary | ICD-10-CM | POA: Diagnosis not present

## 2020-03-01 DIAGNOSIS — K219 Gastro-esophageal reflux disease without esophagitis: Secondary | ICD-10-CM | POA: Diagnosis not present

## 2020-03-01 DIAGNOSIS — N186 End stage renal disease: Secondary | ICD-10-CM | POA: Diagnosis not present

## 2020-03-01 DIAGNOSIS — R5382 Chronic fatigue, unspecified: Secondary | ICD-10-CM | POA: Diagnosis not present

## 2020-03-01 DIAGNOSIS — D649 Anemia, unspecified: Secondary | ICD-10-CM | POA: Diagnosis not present

## 2020-03-01 DIAGNOSIS — M6281 Muscle weakness (generalized): Secondary | ICD-10-CM | POA: Diagnosis not present

## 2020-03-03 DIAGNOSIS — R791 Abnormal coagulation profile: Secondary | ICD-10-CM | POA: Diagnosis not present

## 2020-03-04 DIAGNOSIS — D509 Iron deficiency anemia, unspecified: Secondary | ICD-10-CM | POA: Diagnosis not present

## 2020-03-04 DIAGNOSIS — N186 End stage renal disease: Secondary | ICD-10-CM | POA: Diagnosis not present

## 2020-03-07 DIAGNOSIS — Z5181 Encounter for therapeutic drug level monitoring: Secondary | ICD-10-CM | POA: Diagnosis not present

## 2020-03-07 DIAGNOSIS — D509 Iron deficiency anemia, unspecified: Secondary | ICD-10-CM | POA: Diagnosis not present

## 2020-03-07 DIAGNOSIS — N186 End stage renal disease: Secondary | ICD-10-CM | POA: Diagnosis not present

## 2020-03-07 DIAGNOSIS — Z992 Dependence on renal dialysis: Secondary | ICD-10-CM | POA: Diagnosis not present

## 2020-03-10 DIAGNOSIS — Z79899 Other long term (current) drug therapy: Secondary | ICD-10-CM | POA: Diagnosis not present

## 2020-03-17 DIAGNOSIS — I749 Embolism and thrombosis of unspecified artery: Secondary | ICD-10-CM | POA: Diagnosis not present

## 2020-03-17 DIAGNOSIS — Q211 Atrial septal defect: Secondary | ICD-10-CM | POA: Diagnosis not present

## 2020-03-17 DIAGNOSIS — I10 Essential (primary) hypertension: Secondary | ICD-10-CM | POA: Diagnosis not present

## 2020-04-02 DIAGNOSIS — R0602 Shortness of breath: Secondary | ICD-10-CM | POA: Diagnosis not present

## 2020-04-03 DIAGNOSIS — R9431 Abnormal electrocardiogram [ECG] [EKG]: Secondary | ICD-10-CM | POA: Diagnosis not present

## 2020-04-06 DIAGNOSIS — N186 End stage renal disease: Secondary | ICD-10-CM | POA: Diagnosis not present

## 2020-04-06 DIAGNOSIS — Z992 Dependence on renal dialysis: Secondary | ICD-10-CM | POA: Diagnosis not present

## 2020-04-13 DIAGNOSIS — Z86711 Personal history of pulmonary embolism: Secondary | ICD-10-CM | POA: Diagnosis not present

## 2020-04-13 DIAGNOSIS — Z86718 Personal history of other venous thrombosis and embolism: Secondary | ICD-10-CM | POA: Diagnosis not present

## 2020-04-13 DIAGNOSIS — Z7901 Long term (current) use of anticoagulants: Secondary | ICD-10-CM | POA: Diagnosis not present

## 2020-04-14 DIAGNOSIS — I120 Hypertensive chronic kidney disease with stage 5 chronic kidney disease or end stage renal disease: Secondary | ICD-10-CM | POA: Diagnosis not present

## 2020-04-14 DIAGNOSIS — Z86718 Personal history of other venous thrombosis and embolism: Secondary | ICD-10-CM | POA: Diagnosis not present

## 2020-04-14 DIAGNOSIS — E1122 Type 2 diabetes mellitus with diabetic chronic kidney disease: Secondary | ICD-10-CM | POA: Diagnosis not present

## 2020-04-14 DIAGNOSIS — R279 Unspecified lack of coordination: Secondary | ICD-10-CM | POA: Diagnosis not present

## 2020-04-14 DIAGNOSIS — J9691 Respiratory failure, unspecified with hypoxia: Secondary | ICD-10-CM | POA: Diagnosis not present

## 2020-04-14 DIAGNOSIS — Z743 Need for continuous supervision: Secondary | ICD-10-CM | POA: Diagnosis not present

## 2020-04-14 DIAGNOSIS — J90 Pleural effusion, not elsewhere classified: Secondary | ICD-10-CM | POA: Diagnosis not present

## 2020-04-14 DIAGNOSIS — N186 End stage renal disease: Secondary | ICD-10-CM | POA: Diagnosis not present

## 2020-04-14 DIAGNOSIS — R791 Abnormal coagulation profile: Secondary | ICD-10-CM | POA: Diagnosis not present

## 2020-04-14 DIAGNOSIS — Z992 Dependence on renal dialysis: Secondary | ICD-10-CM | POA: Diagnosis not present

## 2020-04-14 DIAGNOSIS — E8770 Fluid overload, unspecified: Secondary | ICD-10-CM | POA: Diagnosis not present

## 2020-04-14 DIAGNOSIS — J189 Pneumonia, unspecified organism: Secondary | ICD-10-CM | POA: Diagnosis not present

## 2020-04-14 DIAGNOSIS — R531 Weakness: Secondary | ICD-10-CM | POA: Diagnosis not present

## 2020-04-14 DIAGNOSIS — Z86711 Personal history of pulmonary embolism: Secondary | ICD-10-CM | POA: Diagnosis not present

## 2020-04-15 DIAGNOSIS — I70208 Unspecified atherosclerosis of native arteries of extremities, other extremity: Secondary | ICD-10-CM | POA: Diagnosis not present

## 2020-04-15 DIAGNOSIS — G4700 Insomnia, unspecified: Secondary | ICD-10-CM | POA: Diagnosis not present

## 2020-04-15 DIAGNOSIS — M6281 Muscle weakness (generalized): Secondary | ICD-10-CM | POA: Diagnosis not present

## 2020-04-15 DIAGNOSIS — E1140 Type 2 diabetes mellitus with diabetic neuropathy, unspecified: Secondary | ICD-10-CM | POA: Diagnosis not present

## 2020-04-15 DIAGNOSIS — D509 Iron deficiency anemia, unspecified: Secondary | ICD-10-CM | POA: Diagnosis not present

## 2020-04-15 DIAGNOSIS — R269 Unspecified abnormalities of gait and mobility: Secondary | ICD-10-CM | POA: Diagnosis not present

## 2020-04-15 DIAGNOSIS — G609 Hereditary and idiopathic neuropathy, unspecified: Secondary | ICD-10-CM | POA: Diagnosis not present

## 2020-04-15 DIAGNOSIS — J449 Chronic obstructive pulmonary disease, unspecified: Secondary | ICD-10-CM | POA: Diagnosis not present

## 2020-04-15 DIAGNOSIS — I825Y1 Chronic embolism and thrombosis of unspecified deep veins of right proximal lower extremity: Secondary | ICD-10-CM | POA: Diagnosis not present

## 2020-04-15 DIAGNOSIS — I739 Peripheral vascular disease, unspecified: Secondary | ICD-10-CM | POA: Diagnosis not present

## 2020-04-15 DIAGNOSIS — E039 Hypothyroidism, unspecified: Secondary | ICD-10-CM | POA: Diagnosis not present

## 2020-04-15 DIAGNOSIS — Z9111 Patient's noncompliance with dietary regimen: Secondary | ICD-10-CM | POA: Diagnosis not present

## 2020-04-15 DIAGNOSIS — N2581 Secondary hyperparathyroidism of renal origin: Secondary | ICD-10-CM | POA: Diagnosis not present

## 2020-04-15 DIAGNOSIS — Z86711 Personal history of pulmonary embolism: Secondary | ICD-10-CM | POA: Diagnosis not present

## 2020-04-15 DIAGNOSIS — I10 Essential (primary) hypertension: Secondary | ICD-10-CM | POA: Diagnosis not present

## 2020-04-15 DIAGNOSIS — I4891 Unspecified atrial fibrillation: Secondary | ICD-10-CM | POA: Diagnosis not present

## 2020-04-15 DIAGNOSIS — N186 End stage renal disease: Secondary | ICD-10-CM | POA: Diagnosis not present

## 2020-04-15 DIAGNOSIS — Z8673 Personal history of transient ischemic attack (TIA), and cerebral infarction without residual deficits: Secondary | ICD-10-CM | POA: Diagnosis not present

## 2020-04-15 DIAGNOSIS — R2681 Unsteadiness on feet: Secondary | ICD-10-CM | POA: Diagnosis not present

## 2020-04-15 DIAGNOSIS — F331 Major depressive disorder, recurrent, moderate: Secondary | ICD-10-CM | POA: Diagnosis not present

## 2020-04-15 DIAGNOSIS — I509 Heart failure, unspecified: Secondary | ICD-10-CM | POA: Diagnosis not present

## 2020-04-15 DIAGNOSIS — I2782 Chronic pulmonary embolism: Secondary | ICD-10-CM | POA: Diagnosis not present

## 2020-04-15 DIAGNOSIS — Z992 Dependence on renal dialysis: Secondary | ICD-10-CM | POA: Diagnosis not present

## 2020-04-15 DIAGNOSIS — R0602 Shortness of breath: Secondary | ICD-10-CM | POA: Diagnosis not present

## 2020-04-15 DIAGNOSIS — G4733 Obstructive sleep apnea (adult) (pediatric): Secondary | ICD-10-CM | POA: Diagnosis not present

## 2020-04-15 DIAGNOSIS — K219 Gastro-esophageal reflux disease without esophagitis: Secondary | ICD-10-CM | POA: Diagnosis not present

## 2020-04-18 DIAGNOSIS — N186 End stage renal disease: Secondary | ICD-10-CM | POA: Diagnosis not present

## 2020-04-18 DIAGNOSIS — Z992 Dependence on renal dialysis: Secondary | ICD-10-CM | POA: Diagnosis not present

## 2020-04-18 DIAGNOSIS — D509 Iron deficiency anemia, unspecified: Secondary | ICD-10-CM | POA: Diagnosis not present

## 2020-04-18 DIAGNOSIS — J449 Chronic obstructive pulmonary disease, unspecified: Secondary | ICD-10-CM | POA: Diagnosis not present

## 2020-04-18 DIAGNOSIS — E1140 Type 2 diabetes mellitus with diabetic neuropathy, unspecified: Secondary | ICD-10-CM | POA: Diagnosis not present

## 2020-04-18 DIAGNOSIS — I70208 Unspecified atherosclerosis of native arteries of extremities, other extremity: Secondary | ICD-10-CM | POA: Diagnosis not present

## 2020-04-18 DIAGNOSIS — R269 Unspecified abnormalities of gait and mobility: Secondary | ICD-10-CM | POA: Diagnosis not present

## 2020-04-18 DIAGNOSIS — N2581 Secondary hyperparathyroidism of renal origin: Secondary | ICD-10-CM | POA: Diagnosis not present

## 2020-04-19 DIAGNOSIS — J441 Chronic obstructive pulmonary disease with (acute) exacerbation: Secondary | ICD-10-CM | POA: Diagnosis not present

## 2020-04-19 DIAGNOSIS — J449 Chronic obstructive pulmonary disease, unspecified: Secondary | ICD-10-CM | POA: Diagnosis not present

## 2020-04-19 DIAGNOSIS — Z9111 Patient's noncompliance with dietary regimen: Secondary | ICD-10-CM | POA: Diagnosis not present

## 2020-04-19 DIAGNOSIS — K219 Gastro-esophageal reflux disease without esophagitis: Secondary | ICD-10-CM | POA: Diagnosis not present

## 2020-04-19 DIAGNOSIS — R269 Unspecified abnormalities of gait and mobility: Secondary | ICD-10-CM | POA: Diagnosis not present

## 2020-04-19 DIAGNOSIS — G4700 Insomnia, unspecified: Secondary | ICD-10-CM | POA: Diagnosis not present

## 2020-04-19 DIAGNOSIS — E1140 Type 2 diabetes mellitus with diabetic neuropathy, unspecified: Secondary | ICD-10-CM | POA: Diagnosis not present

## 2020-04-19 DIAGNOSIS — I509 Heart failure, unspecified: Secondary | ICD-10-CM | POA: Diagnosis not present

## 2020-04-19 DIAGNOSIS — I639 Cerebral infarction, unspecified: Secondary | ICD-10-CM | POA: Diagnosis not present

## 2020-04-19 DIAGNOSIS — Z86711 Personal history of pulmonary embolism: Secondary | ICD-10-CM | POA: Diagnosis not present

## 2020-04-19 DIAGNOSIS — I70208 Unspecified atherosclerosis of native arteries of extremities, other extremity: Secondary | ICD-10-CM | POA: Diagnosis not present

## 2020-04-19 DIAGNOSIS — N186 End stage renal disease: Secondary | ICD-10-CM | POA: Diagnosis not present

## 2020-04-19 DIAGNOSIS — Z992 Dependence on renal dialysis: Secondary | ICD-10-CM | POA: Diagnosis not present

## 2020-04-19 DIAGNOSIS — M6281 Muscle weakness (generalized): Secondary | ICD-10-CM | POA: Diagnosis not present

## 2020-04-19 DIAGNOSIS — R600 Localized edema: Secondary | ICD-10-CM | POA: Diagnosis not present

## 2020-04-19 DIAGNOSIS — J90 Pleural effusion, not elsewhere classified: Secondary | ICD-10-CM | POA: Diagnosis not present

## 2020-04-19 DIAGNOSIS — E119 Type 2 diabetes mellitus without complications: Secondary | ICD-10-CM | POA: Diagnosis not present

## 2020-04-20 DIAGNOSIS — J449 Chronic obstructive pulmonary disease, unspecified: Secondary | ICD-10-CM | POA: Diagnosis not present

## 2020-04-20 DIAGNOSIS — R269 Unspecified abnormalities of gait and mobility: Secondary | ICD-10-CM | POA: Diagnosis not present

## 2020-04-20 DIAGNOSIS — Z992 Dependence on renal dialysis: Secondary | ICD-10-CM | POA: Diagnosis not present

## 2020-04-20 DIAGNOSIS — D509 Iron deficiency anemia, unspecified: Secondary | ICD-10-CM | POA: Diagnosis not present

## 2020-04-20 DIAGNOSIS — E1140 Type 2 diabetes mellitus with diabetic neuropathy, unspecified: Secondary | ICD-10-CM | POA: Diagnosis not present

## 2020-04-20 DIAGNOSIS — N2581 Secondary hyperparathyroidism of renal origin: Secondary | ICD-10-CM | POA: Diagnosis not present

## 2020-04-20 DIAGNOSIS — I70208 Unspecified atherosclerosis of native arteries of extremities, other extremity: Secondary | ICD-10-CM | POA: Diagnosis not present

## 2020-04-20 DIAGNOSIS — N186 End stage renal disease: Secondary | ICD-10-CM | POA: Diagnosis not present

## 2020-04-21 DIAGNOSIS — J449 Chronic obstructive pulmonary disease, unspecified: Secondary | ICD-10-CM | POA: Diagnosis not present

## 2020-04-21 DIAGNOSIS — N186 End stage renal disease: Secondary | ICD-10-CM | POA: Diagnosis not present

## 2020-04-21 DIAGNOSIS — R269 Unspecified abnormalities of gait and mobility: Secondary | ICD-10-CM | POA: Diagnosis not present

## 2020-04-21 DIAGNOSIS — I70208 Unspecified atherosclerosis of native arteries of extremities, other extremity: Secondary | ICD-10-CM | POA: Diagnosis not present

## 2020-04-21 DIAGNOSIS — E1140 Type 2 diabetes mellitus with diabetic neuropathy, unspecified: Secondary | ICD-10-CM | POA: Diagnosis not present

## 2020-04-21 DIAGNOSIS — Z992 Dependence on renal dialysis: Secondary | ICD-10-CM | POA: Diagnosis not present

## 2020-04-22 DIAGNOSIS — I4891 Unspecified atrial fibrillation: Secondary | ICD-10-CM | POA: Diagnosis not present

## 2020-04-22 DIAGNOSIS — G4700 Insomnia, unspecified: Secondary | ICD-10-CM | POA: Diagnosis not present

## 2020-04-22 DIAGNOSIS — Z992 Dependence on renal dialysis: Secondary | ICD-10-CM | POA: Diagnosis not present

## 2020-04-22 DIAGNOSIS — N186 End stage renal disease: Secondary | ICD-10-CM | POA: Diagnosis not present

## 2020-04-22 DIAGNOSIS — D509 Iron deficiency anemia, unspecified: Secondary | ICD-10-CM | POA: Diagnosis not present

## 2020-04-22 DIAGNOSIS — R600 Localized edema: Secondary | ICD-10-CM | POA: Diagnosis not present

## 2020-04-22 DIAGNOSIS — J90 Pleural effusion, not elsewhere classified: Secondary | ICD-10-CM | POA: Diagnosis not present

## 2020-04-22 DIAGNOSIS — Z86711 Personal history of pulmonary embolism: Secondary | ICD-10-CM | POA: Diagnosis not present

## 2020-04-22 DIAGNOSIS — J441 Chronic obstructive pulmonary disease with (acute) exacerbation: Secondary | ICD-10-CM | POA: Diagnosis not present

## 2020-04-22 DIAGNOSIS — R269 Unspecified abnormalities of gait and mobility: Secondary | ICD-10-CM | POA: Diagnosis not present

## 2020-04-22 DIAGNOSIS — K219 Gastro-esophageal reflux disease without esophagitis: Secondary | ICD-10-CM | POA: Diagnosis not present

## 2020-04-22 DIAGNOSIS — N2581 Secondary hyperparathyroidism of renal origin: Secondary | ICD-10-CM | POA: Diagnosis not present

## 2020-04-22 DIAGNOSIS — E119 Type 2 diabetes mellitus without complications: Secondary | ICD-10-CM | POA: Diagnosis not present

## 2020-04-22 DIAGNOSIS — I509 Heart failure, unspecified: Secondary | ICD-10-CM | POA: Diagnosis not present

## 2020-04-22 DIAGNOSIS — J449 Chronic obstructive pulmonary disease, unspecified: Secondary | ICD-10-CM | POA: Diagnosis not present

## 2020-04-22 DIAGNOSIS — E1140 Type 2 diabetes mellitus with diabetic neuropathy, unspecified: Secondary | ICD-10-CM | POA: Diagnosis not present

## 2020-04-22 DIAGNOSIS — I639 Cerebral infarction, unspecified: Secondary | ICD-10-CM | POA: Diagnosis not present

## 2020-04-22 DIAGNOSIS — I70208 Unspecified atherosclerosis of native arteries of extremities, other extremity: Secondary | ICD-10-CM | POA: Diagnosis not present

## 2020-04-22 DIAGNOSIS — M6281 Muscle weakness (generalized): Secondary | ICD-10-CM | POA: Diagnosis not present

## 2020-04-25 DIAGNOSIS — N186 End stage renal disease: Secondary | ICD-10-CM | POA: Diagnosis not present

## 2020-04-25 DIAGNOSIS — J449 Chronic obstructive pulmonary disease, unspecified: Secondary | ICD-10-CM | POA: Diagnosis not present

## 2020-04-25 DIAGNOSIS — N2581 Secondary hyperparathyroidism of renal origin: Secondary | ICD-10-CM | POA: Diagnosis not present

## 2020-04-25 DIAGNOSIS — R269 Unspecified abnormalities of gait and mobility: Secondary | ICD-10-CM | POA: Diagnosis not present

## 2020-04-25 DIAGNOSIS — Z992 Dependence on renal dialysis: Secondary | ICD-10-CM | POA: Diagnosis not present

## 2020-04-25 DIAGNOSIS — D509 Iron deficiency anemia, unspecified: Secondary | ICD-10-CM | POA: Diagnosis not present

## 2020-04-25 DIAGNOSIS — I70208 Unspecified atherosclerosis of native arteries of extremities, other extremity: Secondary | ICD-10-CM | POA: Diagnosis not present

## 2020-04-25 DIAGNOSIS — E1140 Type 2 diabetes mellitus with diabetic neuropathy, unspecified: Secondary | ICD-10-CM | POA: Diagnosis not present

## 2020-04-26 DIAGNOSIS — E1140 Type 2 diabetes mellitus with diabetic neuropathy, unspecified: Secondary | ICD-10-CM | POA: Diagnosis not present

## 2020-04-26 DIAGNOSIS — J441 Chronic obstructive pulmonary disease with (acute) exacerbation: Secondary | ICD-10-CM | POA: Diagnosis not present

## 2020-04-26 DIAGNOSIS — I639 Cerebral infarction, unspecified: Secondary | ICD-10-CM | POA: Diagnosis not present

## 2020-04-26 DIAGNOSIS — G4700 Insomnia, unspecified: Secondary | ICD-10-CM | POA: Diagnosis not present

## 2020-04-26 DIAGNOSIS — I70208 Unspecified atherosclerosis of native arteries of extremities, other extremity: Secondary | ICD-10-CM | POA: Diagnosis not present

## 2020-04-26 DIAGNOSIS — E119 Type 2 diabetes mellitus without complications: Secondary | ICD-10-CM | POA: Diagnosis not present

## 2020-04-26 DIAGNOSIS — R600 Localized edema: Secondary | ICD-10-CM | POA: Diagnosis not present

## 2020-04-26 DIAGNOSIS — Z992 Dependence on renal dialysis: Secondary | ICD-10-CM | POA: Diagnosis not present

## 2020-04-26 DIAGNOSIS — N186 End stage renal disease: Secondary | ICD-10-CM | POA: Diagnosis not present

## 2020-04-26 DIAGNOSIS — I509 Heart failure, unspecified: Secondary | ICD-10-CM | POA: Diagnosis not present

## 2020-04-26 DIAGNOSIS — K219 Gastro-esophageal reflux disease without esophagitis: Secondary | ICD-10-CM | POA: Diagnosis not present

## 2020-04-26 DIAGNOSIS — J449 Chronic obstructive pulmonary disease, unspecified: Secondary | ICD-10-CM | POA: Diagnosis not present

## 2020-04-26 DIAGNOSIS — R269 Unspecified abnormalities of gait and mobility: Secondary | ICD-10-CM | POA: Diagnosis not present

## 2020-04-26 DIAGNOSIS — J90 Pleural effusion, not elsewhere classified: Secondary | ICD-10-CM | POA: Diagnosis not present

## 2020-04-26 DIAGNOSIS — Z86711 Personal history of pulmonary embolism: Secondary | ICD-10-CM | POA: Diagnosis not present

## 2020-04-26 DIAGNOSIS — I4891 Unspecified atrial fibrillation: Secondary | ICD-10-CM | POA: Diagnosis not present

## 2020-04-26 DIAGNOSIS — M6281 Muscle weakness (generalized): Secondary | ICD-10-CM | POA: Diagnosis not present

## 2020-04-27 DIAGNOSIS — E1140 Type 2 diabetes mellitus with diabetic neuropathy, unspecified: Secondary | ICD-10-CM | POA: Diagnosis not present

## 2020-04-27 DIAGNOSIS — R269 Unspecified abnormalities of gait and mobility: Secondary | ICD-10-CM | POA: Diagnosis not present

## 2020-04-27 DIAGNOSIS — J449 Chronic obstructive pulmonary disease, unspecified: Secondary | ICD-10-CM | POA: Diagnosis not present

## 2020-04-27 DIAGNOSIS — I70208 Unspecified atherosclerosis of native arteries of extremities, other extremity: Secondary | ICD-10-CM | POA: Diagnosis not present

## 2020-04-27 DIAGNOSIS — Z992 Dependence on renal dialysis: Secondary | ICD-10-CM | POA: Diagnosis not present

## 2020-04-27 DIAGNOSIS — N186 End stage renal disease: Secondary | ICD-10-CM | POA: Diagnosis not present

## 2020-04-28 DIAGNOSIS — S42331D Displaced oblique fracture of shaft of humerus, right arm, subsequent encounter for fracture with routine healing: Secondary | ICD-10-CM | POA: Diagnosis not present

## 2020-04-28 DIAGNOSIS — Z992 Dependence on renal dialysis: Secondary | ICD-10-CM | POA: Diagnosis not present

## 2020-04-28 DIAGNOSIS — I70208 Unspecified atherosclerosis of native arteries of extremities, other extremity: Secondary | ICD-10-CM | POA: Diagnosis not present

## 2020-04-28 DIAGNOSIS — I639 Cerebral infarction, unspecified: Secondary | ICD-10-CM | POA: Diagnosis not present

## 2020-04-28 DIAGNOSIS — S42341D Displaced spiral fracture of shaft of humerus, right arm, subsequent encounter for fracture with routine healing: Secondary | ICD-10-CM | POA: Diagnosis not present

## 2020-04-28 DIAGNOSIS — J90 Pleural effusion, not elsewhere classified: Secondary | ICD-10-CM | POA: Diagnosis not present

## 2020-04-28 DIAGNOSIS — E1140 Type 2 diabetes mellitus with diabetic neuropathy, unspecified: Secondary | ICD-10-CM | POA: Diagnosis not present

## 2020-04-28 DIAGNOSIS — R269 Unspecified abnormalities of gait and mobility: Secondary | ICD-10-CM | POA: Diagnosis not present

## 2020-04-28 DIAGNOSIS — J9691 Respiratory failure, unspecified with hypoxia: Secondary | ICD-10-CM | POA: Diagnosis not present

## 2020-04-28 DIAGNOSIS — F339 Major depressive disorder, recurrent, unspecified: Secondary | ICD-10-CM | POA: Diagnosis not present

## 2020-04-28 DIAGNOSIS — J449 Chronic obstructive pulmonary disease, unspecified: Secondary | ICD-10-CM | POA: Diagnosis not present

## 2020-04-28 DIAGNOSIS — Z95828 Presence of other vascular implants and grafts: Secondary | ICD-10-CM | POA: Diagnosis not present

## 2020-04-28 DIAGNOSIS — M19011 Primary osteoarthritis, right shoulder: Secondary | ICD-10-CM | POA: Diagnosis not present

## 2020-04-28 DIAGNOSIS — M6281 Muscle weakness (generalized): Secondary | ICD-10-CM | POA: Diagnosis not present

## 2020-04-28 DIAGNOSIS — G4700 Insomnia, unspecified: Secondary | ICD-10-CM | POA: Diagnosis not present

## 2020-04-28 DIAGNOSIS — N186 End stage renal disease: Secondary | ICD-10-CM | POA: Diagnosis not present

## 2020-04-29 DIAGNOSIS — R269 Unspecified abnormalities of gait and mobility: Secondary | ICD-10-CM | POA: Diagnosis not present

## 2020-04-29 DIAGNOSIS — D509 Iron deficiency anemia, unspecified: Secondary | ICD-10-CM | POA: Diagnosis not present

## 2020-04-29 DIAGNOSIS — N2581 Secondary hyperparathyroidism of renal origin: Secondary | ICD-10-CM | POA: Diagnosis not present

## 2020-04-29 DIAGNOSIS — M6281 Muscle weakness (generalized): Secondary | ICD-10-CM | POA: Diagnosis not present

## 2020-04-29 DIAGNOSIS — J449 Chronic obstructive pulmonary disease, unspecified: Secondary | ICD-10-CM | POA: Diagnosis not present

## 2020-04-29 DIAGNOSIS — I509 Heart failure, unspecified: Secondary | ICD-10-CM | POA: Diagnosis not present

## 2020-04-29 DIAGNOSIS — N186 End stage renal disease: Secondary | ICD-10-CM | POA: Diagnosis not present

## 2020-04-29 DIAGNOSIS — E1140 Type 2 diabetes mellitus with diabetic neuropathy, unspecified: Secondary | ICD-10-CM | POA: Diagnosis not present

## 2020-04-29 DIAGNOSIS — G4700 Insomnia, unspecified: Secondary | ICD-10-CM | POA: Diagnosis not present

## 2020-04-29 DIAGNOSIS — J441 Chronic obstructive pulmonary disease with (acute) exacerbation: Secondary | ICD-10-CM | POA: Diagnosis not present

## 2020-04-29 DIAGNOSIS — R600 Localized edema: Secondary | ICD-10-CM | POA: Diagnosis not present

## 2020-04-29 DIAGNOSIS — I70208 Unspecified atherosclerosis of native arteries of extremities, other extremity: Secondary | ICD-10-CM | POA: Diagnosis not present

## 2020-04-29 DIAGNOSIS — K219 Gastro-esophageal reflux disease without esophagitis: Secondary | ICD-10-CM | POA: Diagnosis not present

## 2020-04-29 DIAGNOSIS — I639 Cerebral infarction, unspecified: Secondary | ICD-10-CM | POA: Diagnosis not present

## 2020-04-29 DIAGNOSIS — I4891 Unspecified atrial fibrillation: Secondary | ICD-10-CM | POA: Diagnosis not present

## 2020-04-29 DIAGNOSIS — Z992 Dependence on renal dialysis: Secondary | ICD-10-CM | POA: Diagnosis not present

## 2020-04-29 DIAGNOSIS — J90 Pleural effusion, not elsewhere classified: Secondary | ICD-10-CM | POA: Diagnosis not present

## 2020-04-29 DIAGNOSIS — E119 Type 2 diabetes mellitus without complications: Secondary | ICD-10-CM | POA: Diagnosis not present

## 2020-04-29 DIAGNOSIS — I2609 Other pulmonary embolism with acute cor pulmonale: Secondary | ICD-10-CM | POA: Diagnosis not present

## 2020-05-02 DIAGNOSIS — J449 Chronic obstructive pulmonary disease, unspecified: Secondary | ICD-10-CM | POA: Diagnosis not present

## 2020-05-02 DIAGNOSIS — E1140 Type 2 diabetes mellitus with diabetic neuropathy, unspecified: Secondary | ICD-10-CM | POA: Diagnosis not present

## 2020-05-02 DIAGNOSIS — N186 End stage renal disease: Secondary | ICD-10-CM | POA: Diagnosis not present

## 2020-05-02 DIAGNOSIS — Z992 Dependence on renal dialysis: Secondary | ICD-10-CM | POA: Diagnosis not present

## 2020-05-02 DIAGNOSIS — G4700 Insomnia, unspecified: Secondary | ICD-10-CM | POA: Diagnosis not present

## 2020-05-02 DIAGNOSIS — F339 Major depressive disorder, recurrent, unspecified: Secondary | ICD-10-CM | POA: Diagnosis not present

## 2020-05-02 DIAGNOSIS — R269 Unspecified abnormalities of gait and mobility: Secondary | ICD-10-CM | POA: Diagnosis not present

## 2020-05-02 DIAGNOSIS — D509 Iron deficiency anemia, unspecified: Secondary | ICD-10-CM | POA: Diagnosis not present

## 2020-05-02 DIAGNOSIS — I70208 Unspecified atherosclerosis of native arteries of extremities, other extremity: Secondary | ICD-10-CM | POA: Diagnosis not present

## 2020-05-02 DIAGNOSIS — N2581 Secondary hyperparathyroidism of renal origin: Secondary | ICD-10-CM | POA: Diagnosis not present

## 2020-05-03 DIAGNOSIS — J449 Chronic obstructive pulmonary disease, unspecified: Secondary | ICD-10-CM | POA: Diagnosis not present

## 2020-05-03 DIAGNOSIS — N186 End stage renal disease: Secondary | ICD-10-CM | POA: Diagnosis not present

## 2020-05-03 DIAGNOSIS — R269 Unspecified abnormalities of gait and mobility: Secondary | ICD-10-CM | POA: Diagnosis not present

## 2020-05-03 DIAGNOSIS — Z992 Dependence on renal dialysis: Secondary | ICD-10-CM | POA: Diagnosis not present

## 2020-05-03 DIAGNOSIS — E1140 Type 2 diabetes mellitus with diabetic neuropathy, unspecified: Secondary | ICD-10-CM | POA: Diagnosis not present

## 2020-05-03 DIAGNOSIS — I70208 Unspecified atherosclerosis of native arteries of extremities, other extremity: Secondary | ICD-10-CM | POA: Diagnosis not present

## 2020-05-04 DIAGNOSIS — N186 End stage renal disease: Secondary | ICD-10-CM | POA: Diagnosis not present

## 2020-05-04 DIAGNOSIS — J449 Chronic obstructive pulmonary disease, unspecified: Secondary | ICD-10-CM | POA: Diagnosis not present

## 2020-05-04 DIAGNOSIS — R269 Unspecified abnormalities of gait and mobility: Secondary | ICD-10-CM | POA: Diagnosis not present

## 2020-05-04 DIAGNOSIS — I70208 Unspecified atherosclerosis of native arteries of extremities, other extremity: Secondary | ICD-10-CM | POA: Diagnosis not present

## 2020-05-04 DIAGNOSIS — Z992 Dependence on renal dialysis: Secondary | ICD-10-CM | POA: Diagnosis not present

## 2020-05-04 DIAGNOSIS — D509 Iron deficiency anemia, unspecified: Secondary | ICD-10-CM | POA: Diagnosis not present

## 2020-05-04 DIAGNOSIS — N2581 Secondary hyperparathyroidism of renal origin: Secondary | ICD-10-CM | POA: Diagnosis not present

## 2020-05-04 DIAGNOSIS — E1140 Type 2 diabetes mellitus with diabetic neuropathy, unspecified: Secondary | ICD-10-CM | POA: Diagnosis not present

## 2020-05-05 DIAGNOSIS — E1140 Type 2 diabetes mellitus with diabetic neuropathy, unspecified: Secondary | ICD-10-CM | POA: Diagnosis not present

## 2020-05-05 DIAGNOSIS — J449 Chronic obstructive pulmonary disease, unspecified: Secondary | ICD-10-CM | POA: Diagnosis not present

## 2020-05-05 DIAGNOSIS — I70208 Unspecified atherosclerosis of native arteries of extremities, other extremity: Secondary | ICD-10-CM | POA: Diagnosis not present

## 2020-05-05 DIAGNOSIS — R269 Unspecified abnormalities of gait and mobility: Secondary | ICD-10-CM | POA: Diagnosis not present

## 2020-05-05 DIAGNOSIS — N186 End stage renal disease: Secondary | ICD-10-CM | POA: Diagnosis not present

## 2020-05-05 DIAGNOSIS — Z992 Dependence on renal dialysis: Secondary | ICD-10-CM | POA: Diagnosis not present

## 2020-05-06 DIAGNOSIS — N2581 Secondary hyperparathyroidism of renal origin: Secondary | ICD-10-CM | POA: Diagnosis not present

## 2020-05-06 DIAGNOSIS — D509 Iron deficiency anemia, unspecified: Secondary | ICD-10-CM | POA: Diagnosis not present

## 2020-05-06 DIAGNOSIS — N186 End stage renal disease: Secondary | ICD-10-CM | POA: Diagnosis not present

## 2020-05-07 DIAGNOSIS — J449 Chronic obstructive pulmonary disease, unspecified: Secondary | ICD-10-CM | POA: Diagnosis not present

## 2020-05-07 DIAGNOSIS — E1140 Type 2 diabetes mellitus with diabetic neuropathy, unspecified: Secondary | ICD-10-CM | POA: Diagnosis not present

## 2020-05-07 DIAGNOSIS — I70208 Unspecified atherosclerosis of native arteries of extremities, other extremity: Secondary | ICD-10-CM | POA: Diagnosis not present

## 2020-05-07 DIAGNOSIS — N186 End stage renal disease: Secondary | ICD-10-CM | POA: Diagnosis not present

## 2020-05-07 DIAGNOSIS — R269 Unspecified abnormalities of gait and mobility: Secondary | ICD-10-CM | POA: Diagnosis not present

## 2020-05-07 DIAGNOSIS — Z992 Dependence on renal dialysis: Secondary | ICD-10-CM | POA: Diagnosis not present

## 2020-05-09 DIAGNOSIS — R269 Unspecified abnormalities of gait and mobility: Secondary | ICD-10-CM | POA: Diagnosis not present

## 2020-05-09 DIAGNOSIS — Z86711 Personal history of pulmonary embolism: Secondary | ICD-10-CM | POA: Diagnosis not present

## 2020-05-09 DIAGNOSIS — I825Y1 Chronic embolism and thrombosis of unspecified deep veins of right proximal lower extremity: Secondary | ICD-10-CM | POA: Diagnosis not present

## 2020-05-09 DIAGNOSIS — Z9111 Patient's noncompliance with dietary regimen: Secondary | ICD-10-CM | POA: Diagnosis not present

## 2020-05-09 DIAGNOSIS — D509 Iron deficiency anemia, unspecified: Secondary | ICD-10-CM | POA: Diagnosis not present

## 2020-05-09 DIAGNOSIS — I70208 Unspecified atherosclerosis of native arteries of extremities, other extremity: Secondary | ICD-10-CM | POA: Diagnosis not present

## 2020-05-09 DIAGNOSIS — I509 Heart failure, unspecified: Secondary | ICD-10-CM | POA: Diagnosis not present

## 2020-05-09 DIAGNOSIS — E039 Hypothyroidism, unspecified: Secondary | ICD-10-CM | POA: Diagnosis not present

## 2020-05-09 DIAGNOSIS — F331 Major depressive disorder, recurrent, moderate: Secondary | ICD-10-CM | POA: Diagnosis not present

## 2020-05-09 DIAGNOSIS — M6281 Muscle weakness (generalized): Secondary | ICD-10-CM | POA: Diagnosis not present

## 2020-05-09 DIAGNOSIS — R0602 Shortness of breath: Secondary | ICD-10-CM | POA: Diagnosis not present

## 2020-05-09 DIAGNOSIS — I2782 Chronic pulmonary embolism: Secondary | ICD-10-CM | POA: Diagnosis not present

## 2020-05-09 DIAGNOSIS — Z8673 Personal history of transient ischemic attack (TIA), and cerebral infarction without residual deficits: Secondary | ICD-10-CM | POA: Diagnosis not present

## 2020-05-09 DIAGNOSIS — R7881 Bacteremia: Secondary | ICD-10-CM | POA: Diagnosis not present

## 2020-05-09 DIAGNOSIS — Z992 Dependence on renal dialysis: Secondary | ICD-10-CM | POA: Diagnosis not present

## 2020-05-09 DIAGNOSIS — G4733 Obstructive sleep apnea (adult) (pediatric): Secondary | ICD-10-CM | POA: Diagnosis not present

## 2020-05-09 DIAGNOSIS — J449 Chronic obstructive pulmonary disease, unspecified: Secondary | ICD-10-CM | POA: Diagnosis not present

## 2020-05-09 DIAGNOSIS — G4700 Insomnia, unspecified: Secondary | ICD-10-CM | POA: Diagnosis not present

## 2020-05-09 DIAGNOSIS — I4891 Unspecified atrial fibrillation: Secondary | ICD-10-CM | POA: Diagnosis not present

## 2020-05-09 DIAGNOSIS — G609 Hereditary and idiopathic neuropathy, unspecified: Secondary | ICD-10-CM | POA: Diagnosis not present

## 2020-05-09 DIAGNOSIS — E1140 Type 2 diabetes mellitus with diabetic neuropathy, unspecified: Secondary | ICD-10-CM | POA: Diagnosis not present

## 2020-05-09 DIAGNOSIS — N186 End stage renal disease: Secondary | ICD-10-CM | POA: Diagnosis not present

## 2020-05-09 DIAGNOSIS — R2681 Unsteadiness on feet: Secondary | ICD-10-CM | POA: Diagnosis not present

## 2020-05-09 DIAGNOSIS — E1122 Type 2 diabetes mellitus with diabetic chronic kidney disease: Secondary | ICD-10-CM | POA: Diagnosis not present

## 2020-05-09 DIAGNOSIS — I10 Essential (primary) hypertension: Secondary | ICD-10-CM | POA: Diagnosis not present

## 2020-05-09 DIAGNOSIS — I739 Peripheral vascular disease, unspecified: Secondary | ICD-10-CM | POA: Diagnosis not present

## 2020-05-09 DIAGNOSIS — K219 Gastro-esophageal reflux disease without esophagitis: Secondary | ICD-10-CM | POA: Diagnosis not present

## 2020-05-10 DIAGNOSIS — Z7902 Long term (current) use of antithrombotics/antiplatelets: Secondary | ICD-10-CM | POA: Diagnosis not present

## 2020-05-10 DIAGNOSIS — Z86718 Personal history of other venous thrombosis and embolism: Secondary | ICD-10-CM | POA: Diagnosis not present

## 2020-05-10 DIAGNOSIS — E8809 Other disorders of plasma-protein metabolism, not elsewhere classified: Secondary | ICD-10-CM | POA: Diagnosis not present

## 2020-05-10 DIAGNOSIS — R29704 NIHSS score 4: Secondary | ICD-10-CM | POA: Diagnosis present

## 2020-05-10 DIAGNOSIS — I2699 Other pulmonary embolism without acute cor pulmonale: Secondary | ICD-10-CM | POA: Diagnosis not present

## 2020-05-10 DIAGNOSIS — R29711 NIHSS score 11: Secondary | ICD-10-CM | POA: Diagnosis not present

## 2020-05-10 DIAGNOSIS — I509 Heart failure, unspecified: Secondary | ICD-10-CM | POA: Diagnosis not present

## 2020-05-10 DIAGNOSIS — I825Y1 Chronic embolism and thrombosis of unspecified deep veins of right proximal lower extremity: Secondary | ICD-10-CM | POA: Diagnosis not present

## 2020-05-10 DIAGNOSIS — I63512 Cerebral infarction due to unspecified occlusion or stenosis of left middle cerebral artery: Secondary | ICD-10-CM | POA: Diagnosis not present

## 2020-05-10 DIAGNOSIS — Z87891 Personal history of nicotine dependence: Secondary | ICD-10-CM | POA: Diagnosis not present

## 2020-05-10 DIAGNOSIS — J449 Chronic obstructive pulmonary disease, unspecified: Secondary | ICD-10-CM | POA: Diagnosis present

## 2020-05-10 DIAGNOSIS — G8191 Hemiplegia, unspecified affecting right dominant side: Secondary | ICD-10-CM | POA: Diagnosis present

## 2020-05-10 DIAGNOSIS — Z8673 Personal history of transient ischemic attack (TIA), and cerebral infarction without residual deficits: Secondary | ICD-10-CM | POA: Diagnosis not present

## 2020-05-10 DIAGNOSIS — R531 Weakness: Secondary | ICD-10-CM | POA: Diagnosis not present

## 2020-05-10 DIAGNOSIS — Z794 Long term (current) use of insulin: Secondary | ICD-10-CM | POA: Diagnosis not present

## 2020-05-10 DIAGNOSIS — R279 Unspecified lack of coordination: Secondary | ICD-10-CM | POA: Diagnosis not present

## 2020-05-10 DIAGNOSIS — R29810 Facial weakness: Secondary | ICD-10-CM | POA: Diagnosis not present

## 2020-05-10 DIAGNOSIS — I491 Atrial premature depolarization: Secondary | ICD-10-CM | POA: Diagnosis not present

## 2020-05-10 DIAGNOSIS — I739 Peripheral vascular disease, unspecified: Secondary | ICD-10-CM | POA: Diagnosis not present

## 2020-05-10 DIAGNOSIS — I639 Cerebral infarction, unspecified: Secondary | ICD-10-CM | POA: Diagnosis not present

## 2020-05-10 DIAGNOSIS — Z049 Encounter for examination and observation for unspecified reason: Secondary | ICD-10-CM | POA: Diagnosis not present

## 2020-05-10 DIAGNOSIS — E039 Hypothyroidism, unspecified: Secondary | ICD-10-CM | POA: Diagnosis not present

## 2020-05-10 DIAGNOSIS — Q211 Atrial septal defect: Secondary | ICD-10-CM | POA: Diagnosis not present

## 2020-05-10 DIAGNOSIS — Z86711 Personal history of pulmonary embolism: Secondary | ICD-10-CM | POA: Diagnosis not present

## 2020-05-10 DIAGNOSIS — R0902 Hypoxemia: Secondary | ICD-10-CM | POA: Diagnosis not present

## 2020-05-10 DIAGNOSIS — E1122 Type 2 diabetes mellitus with diabetic chronic kidney disease: Secondary | ICD-10-CM | POA: Diagnosis not present

## 2020-05-10 DIAGNOSIS — Z743 Need for continuous supervision: Secondary | ICD-10-CM | POA: Diagnosis not present

## 2020-05-10 DIAGNOSIS — Z20822 Contact with and (suspected) exposure to covid-19: Secondary | ICD-10-CM | POA: Diagnosis not present

## 2020-05-10 DIAGNOSIS — Z7901 Long term (current) use of anticoagulants: Secondary | ICD-10-CM | POA: Diagnosis not present

## 2020-05-10 DIAGNOSIS — I4891 Unspecified atrial fibrillation: Secondary | ICD-10-CM | POA: Diagnosis not present

## 2020-05-10 DIAGNOSIS — G459 Transient cerebral ischemic attack, unspecified: Secondary | ICD-10-CM | POA: Diagnosis not present

## 2020-05-10 DIAGNOSIS — D631 Anemia in chronic kidney disease: Secondary | ICD-10-CM | POA: Diagnosis not present

## 2020-05-10 DIAGNOSIS — J90 Pleural effusion, not elsewhere classified: Secondary | ICD-10-CM | POA: Diagnosis not present

## 2020-05-10 DIAGNOSIS — G4733 Obstructive sleep apnea (adult) (pediatric): Secondary | ICD-10-CM | POA: Diagnosis not present

## 2020-05-10 DIAGNOSIS — D6859 Other primary thrombophilia: Secondary | ICD-10-CM | POA: Diagnosis not present

## 2020-05-10 DIAGNOSIS — I2693 Single subsegmental pulmonary embolism without acute cor pulmonale: Secondary | ICD-10-CM | POA: Diagnosis present

## 2020-05-10 DIAGNOSIS — R4701 Aphasia: Secondary | ICD-10-CM | POA: Diagnosis not present

## 2020-05-10 DIAGNOSIS — R2681 Unsteadiness on feet: Secondary | ICD-10-CM | POA: Diagnosis not present

## 2020-05-10 DIAGNOSIS — R4781 Slurred speech: Secondary | ICD-10-CM | POA: Diagnosis not present

## 2020-05-10 DIAGNOSIS — R269 Unspecified abnormalities of gait and mobility: Secondary | ICD-10-CM | POA: Diagnosis not present

## 2020-05-10 DIAGNOSIS — R0602 Shortness of breath: Secondary | ICD-10-CM | POA: Diagnosis not present

## 2020-05-10 DIAGNOSIS — F331 Major depressive disorder, recurrent, moderate: Secondary | ICD-10-CM | POA: Diagnosis not present

## 2020-05-10 DIAGNOSIS — I272 Pulmonary hypertension, unspecified: Secondary | ICD-10-CM | POA: Diagnosis not present

## 2020-05-10 DIAGNOSIS — R0689 Other abnormalities of breathing: Secondary | ICD-10-CM | POA: Diagnosis not present

## 2020-05-10 DIAGNOSIS — G609 Hereditary and idiopathic neuropathy, unspecified: Secondary | ICD-10-CM | POA: Diagnosis not present

## 2020-05-10 DIAGNOSIS — I5020 Unspecified systolic (congestive) heart failure: Secondary | ICD-10-CM | POA: Diagnosis not present

## 2020-05-10 DIAGNOSIS — I120 Hypertensive chronic kidney disease with stage 5 chronic kidney disease or end stage renal disease: Secondary | ICD-10-CM | POA: Diagnosis not present

## 2020-05-10 DIAGNOSIS — I517 Cardiomegaly: Secondary | ICD-10-CM | POA: Diagnosis not present

## 2020-05-10 DIAGNOSIS — I2782 Chronic pulmonary embolism: Secondary | ICD-10-CM | POA: Diagnosis not present

## 2020-05-10 DIAGNOSIS — Z992 Dependence on renal dialysis: Secondary | ICD-10-CM | POA: Diagnosis not present

## 2020-05-10 DIAGNOSIS — M6281 Muscle weakness (generalized): Secondary | ICD-10-CM | POA: Diagnosis not present

## 2020-05-10 DIAGNOSIS — I361 Nonrheumatic tricuspid (valve) insufficiency: Secondary | ICD-10-CM | POA: Diagnosis not present

## 2020-05-10 DIAGNOSIS — R062 Wheezing: Secondary | ICD-10-CM | POA: Diagnosis not present

## 2020-05-10 DIAGNOSIS — J9691 Respiratory failure, unspecified with hypoxia: Secondary | ICD-10-CM | POA: Diagnosis not present

## 2020-05-10 DIAGNOSIS — G4700 Insomnia, unspecified: Secondary | ICD-10-CM | POA: Diagnosis not present

## 2020-05-10 DIAGNOSIS — E1140 Type 2 diabetes mellitus with diabetic neuropathy, unspecified: Secondary | ICD-10-CM | POA: Diagnosis not present

## 2020-05-10 DIAGNOSIS — I4510 Unspecified right bundle-branch block: Secondary | ICD-10-CM | POA: Diagnosis not present

## 2020-05-10 DIAGNOSIS — I749 Embolism and thrombosis of unspecified artery: Secondary | ICD-10-CM | POA: Diagnosis not present

## 2020-05-10 DIAGNOSIS — K219 Gastro-esophageal reflux disease without esophagitis: Secondary | ICD-10-CM | POA: Diagnosis not present

## 2020-05-10 DIAGNOSIS — N186 End stage renal disease: Secondary | ICD-10-CM | POA: Diagnosis present

## 2020-05-10 DIAGNOSIS — R918 Other nonspecific abnormal finding of lung field: Secondary | ICD-10-CM | POA: Diagnosis not present

## 2020-05-10 DIAGNOSIS — I6602 Occlusion and stenosis of left middle cerebral artery: Secondary | ICD-10-CM | POA: Diagnosis not present

## 2020-05-10 DIAGNOSIS — I6603 Occlusion and stenosis of bilateral middle cerebral arteries: Secondary | ICD-10-CM | POA: Diagnosis not present

## 2020-05-10 DIAGNOSIS — I10 Essential (primary) hypertension: Secondary | ICD-10-CM | POA: Diagnosis not present

## 2020-05-10 DIAGNOSIS — Z9111 Patient's noncompliance with dietary regimen: Secondary | ICD-10-CM | POA: Diagnosis not present

## 2020-05-10 DIAGNOSIS — I70208 Unspecified atherosclerosis of native arteries of extremities, other extremity: Secondary | ICD-10-CM | POA: Diagnosis not present

## 2020-05-10 DIAGNOSIS — I63513 Cerebral infarction due to unspecified occlusion or stenosis of bilateral middle cerebral arteries: Secondary | ICD-10-CM | POA: Diagnosis not present

## 2020-05-10 DIAGNOSIS — I6389 Other cerebral infarction: Secondary | ICD-10-CM | POA: Diagnosis not present

## 2020-05-10 DIAGNOSIS — E8770 Fluid overload, unspecified: Secondary | ICD-10-CM | POA: Diagnosis not present

## 2020-05-17 DIAGNOSIS — I2609 Other pulmonary embolism with acute cor pulmonale: Secondary | ICD-10-CM | POA: Diagnosis not present

## 2020-05-17 DIAGNOSIS — Z5181 Encounter for therapeutic drug level monitoring: Secondary | ICD-10-CM | POA: Diagnosis not present

## 2020-05-17 DIAGNOSIS — I959 Hypotension, unspecified: Secondary | ICD-10-CM | POA: Diagnosis not present

## 2020-05-17 DIAGNOSIS — E785 Hyperlipidemia, unspecified: Secondary | ICD-10-CM | POA: Diagnosis not present

## 2020-05-17 DIAGNOSIS — N390 Urinary tract infection, site not specified: Secondary | ICD-10-CM | POA: Diagnosis not present

## 2020-05-17 DIAGNOSIS — I639 Cerebral infarction, unspecified: Secondary | ICD-10-CM | POA: Diagnosis not present

## 2020-05-17 DIAGNOSIS — K219 Gastro-esophageal reflux disease without esophagitis: Secondary | ICD-10-CM | POA: Diagnosis not present

## 2020-05-17 DIAGNOSIS — Z7901 Long term (current) use of anticoagulants: Secondary | ICD-10-CM | POA: Diagnosis not present

## 2020-05-17 DIAGNOSIS — R682 Dry mouth, unspecified: Secondary | ICD-10-CM | POA: Diagnosis not present

## 2020-05-17 DIAGNOSIS — I2782 Chronic pulmonary embolism: Secondary | ICD-10-CM | POA: Diagnosis not present

## 2020-05-17 DIAGNOSIS — R279 Unspecified lack of coordination: Secondary | ICD-10-CM | POA: Diagnosis not present

## 2020-05-17 DIAGNOSIS — M4844XG Fatigue fracture of vertebra, thoracic region, subsequent encounter for fracture with delayed healing: Secondary | ICD-10-CM | POA: Diagnosis not present

## 2020-05-17 DIAGNOSIS — E1122 Type 2 diabetes mellitus with diabetic chronic kidney disease: Secondary | ICD-10-CM | POA: Diagnosis not present

## 2020-05-17 DIAGNOSIS — Q211 Atrial septal defect: Secondary | ICD-10-CM | POA: Diagnosis not present

## 2020-05-17 DIAGNOSIS — J810 Acute pulmonary edema: Secondary | ICD-10-CM | POA: Diagnosis not present

## 2020-05-17 DIAGNOSIS — F4321 Adjustment disorder with depressed mood: Secondary | ICD-10-CM | POA: Diagnosis not present

## 2020-05-17 DIAGNOSIS — J449 Chronic obstructive pulmonary disease, unspecified: Secondary | ICD-10-CM | POA: Diagnosis not present

## 2020-05-17 DIAGNOSIS — Z9111 Patient's noncompliance with dietary regimen: Secondary | ICD-10-CM | POA: Diagnosis not present

## 2020-05-17 DIAGNOSIS — I4510 Unspecified right bundle-branch block: Secondary | ICD-10-CM | POA: Diagnosis not present

## 2020-05-17 DIAGNOSIS — M6281 Muscle weakness (generalized): Secondary | ICD-10-CM | POA: Diagnosis not present

## 2020-05-17 DIAGNOSIS — G609 Hereditary and idiopathic neuropathy, unspecified: Secondary | ICD-10-CM | POA: Diagnosis not present

## 2020-05-17 DIAGNOSIS — J9691 Respiratory failure, unspecified with hypoxia: Secondary | ICD-10-CM | POA: Diagnosis not present

## 2020-05-17 DIAGNOSIS — I70208 Unspecified atherosclerosis of native arteries of extremities, other extremity: Secondary | ICD-10-CM | POA: Diagnosis not present

## 2020-05-17 DIAGNOSIS — D509 Iron deficiency anemia, unspecified: Secondary | ICD-10-CM | POA: Diagnosis not present

## 2020-05-17 DIAGNOSIS — I63423 Cerebral infarction due to embolism of bilateral anterior cerebral arteries: Secondary | ICD-10-CM | POA: Diagnosis not present

## 2020-05-17 DIAGNOSIS — I480 Paroxysmal atrial fibrillation: Secondary | ICD-10-CM | POA: Diagnosis not present

## 2020-05-17 DIAGNOSIS — R2681 Unsteadiness on feet: Secondary | ICD-10-CM | POA: Diagnosis not present

## 2020-05-17 DIAGNOSIS — R0602 Shortness of breath: Secondary | ICD-10-CM | POA: Diagnosis not present

## 2020-05-17 DIAGNOSIS — I517 Cardiomegaly: Secondary | ICD-10-CM | POA: Diagnosis not present

## 2020-05-17 DIAGNOSIS — R0689 Other abnormalities of breathing: Secondary | ICD-10-CM | POA: Diagnosis not present

## 2020-05-17 DIAGNOSIS — S22088D Other fracture of T11-T12 vertebra, subsequent encounter for fracture with routine healing: Secondary | ICD-10-CM | POA: Diagnosis not present

## 2020-05-17 DIAGNOSIS — I10 Essential (primary) hypertension: Secondary | ICD-10-CM | POA: Diagnosis not present

## 2020-05-17 DIAGNOSIS — R269 Unspecified abnormalities of gait and mobility: Secondary | ICD-10-CM | POA: Diagnosis not present

## 2020-05-17 DIAGNOSIS — G459 Transient cerebral ischemic attack, unspecified: Secondary | ICD-10-CM | POA: Diagnosis not present

## 2020-05-17 DIAGNOSIS — J441 Chronic obstructive pulmonary disease with (acute) exacerbation: Secondary | ICD-10-CM | POA: Diagnosis not present

## 2020-05-17 DIAGNOSIS — E118 Type 2 diabetes mellitus with unspecified complications: Secondary | ICD-10-CM | POA: Diagnosis not present

## 2020-05-17 DIAGNOSIS — R600 Localized edema: Secondary | ICD-10-CM | POA: Diagnosis not present

## 2020-05-17 DIAGNOSIS — F17290 Nicotine dependence, other tobacco product, uncomplicated: Secondary | ICD-10-CM | POA: Diagnosis not present

## 2020-05-17 DIAGNOSIS — Z992 Dependence on renal dialysis: Secondary | ICD-10-CM | POA: Diagnosis not present

## 2020-05-17 DIAGNOSIS — R062 Wheezing: Secondary | ICD-10-CM | POA: Diagnosis not present

## 2020-05-17 DIAGNOSIS — R791 Abnormal coagulation profile: Secondary | ICD-10-CM | POA: Diagnosis not present

## 2020-05-17 DIAGNOSIS — I120 Hypertensive chronic kidney disease with stage 5 chronic kidney disease or end stage renal disease: Secondary | ICD-10-CM | POA: Diagnosis not present

## 2020-05-17 DIAGNOSIS — I2699 Other pulmonary embolism without acute cor pulmonale: Secondary | ICD-10-CM | POA: Diagnosis not present

## 2020-05-17 DIAGNOSIS — I509 Heart failure, unspecified: Secondary | ICD-10-CM | POA: Diagnosis not present

## 2020-05-17 DIAGNOSIS — I63512 Cerebral infarction due to unspecified occlusion or stenosis of left middle cerebral artery: Secondary | ICD-10-CM | POA: Diagnosis not present

## 2020-05-17 DIAGNOSIS — I739 Peripheral vascular disease, unspecified: Secondary | ICD-10-CM | POA: Diagnosis not present

## 2020-05-17 DIAGNOSIS — E039 Hypothyroidism, unspecified: Secondary | ICD-10-CM | POA: Diagnosis not present

## 2020-05-17 DIAGNOSIS — J90 Pleural effusion, not elsewhere classified: Secondary | ICD-10-CM | POA: Diagnosis not present

## 2020-05-17 DIAGNOSIS — I498 Other specified cardiac arrhythmias: Secondary | ICD-10-CM | POA: Diagnosis not present

## 2020-05-17 DIAGNOSIS — E119 Type 2 diabetes mellitus without complications: Secondary | ICD-10-CM | POA: Diagnosis not present

## 2020-05-17 DIAGNOSIS — Z79899 Other long term (current) drug therapy: Secondary | ICD-10-CM | POA: Diagnosis not present

## 2020-05-17 DIAGNOSIS — D6859 Other primary thrombophilia: Secondary | ICD-10-CM | POA: Diagnosis not present

## 2020-05-17 DIAGNOSIS — Z743 Need for continuous supervision: Secondary | ICD-10-CM | POA: Diagnosis not present

## 2020-05-17 DIAGNOSIS — I4891 Unspecified atrial fibrillation: Secondary | ICD-10-CM | POA: Diagnosis not present

## 2020-05-17 DIAGNOSIS — Z87891 Personal history of nicotine dependence: Secondary | ICD-10-CM | POA: Diagnosis not present

## 2020-05-17 DIAGNOSIS — F331 Major depressive disorder, recurrent, moderate: Secondary | ICD-10-CM | POA: Diagnosis not present

## 2020-05-17 DIAGNOSIS — I825Y1 Chronic embolism and thrombosis of unspecified deep veins of right proximal lower extremity: Secondary | ICD-10-CM | POA: Diagnosis not present

## 2020-05-17 DIAGNOSIS — G4733 Obstructive sleep apnea (adult) (pediatric): Secondary | ICD-10-CM | POA: Diagnosis not present

## 2020-05-17 DIAGNOSIS — R7881 Bacteremia: Secondary | ICD-10-CM | POA: Diagnosis not present

## 2020-05-17 DIAGNOSIS — G4700 Insomnia, unspecified: Secondary | ICD-10-CM | POA: Diagnosis not present

## 2020-05-17 DIAGNOSIS — Z8673 Personal history of transient ischemic attack (TIA), and cerebral infarction without residual deficits: Secondary | ICD-10-CM | POA: Diagnosis not present

## 2020-05-17 DIAGNOSIS — U071 COVID-19: Secondary | ICD-10-CM | POA: Diagnosis not present

## 2020-05-17 DIAGNOSIS — E1140 Type 2 diabetes mellitus with diabetic neuropathy, unspecified: Secondary | ICD-10-CM | POA: Diagnosis not present

## 2020-05-17 DIAGNOSIS — R2243 Localized swelling, mass and lump, lower limb, bilateral: Secondary | ICD-10-CM | POA: Diagnosis not present

## 2020-05-17 DIAGNOSIS — Z86711 Personal history of pulmonary embolism: Secondary | ICD-10-CM | POA: Diagnosis not present

## 2020-05-17 DIAGNOSIS — Z20822 Contact with and (suspected) exposure to covid-19: Secondary | ICD-10-CM | POA: Diagnosis not present

## 2020-05-17 DIAGNOSIS — N186 End stage renal disease: Secondary | ICD-10-CM | POA: Diagnosis not present

## 2020-05-18 DIAGNOSIS — D509 Iron deficiency anemia, unspecified: Secondary | ICD-10-CM | POA: Diagnosis not present

## 2020-05-18 DIAGNOSIS — E1122 Type 2 diabetes mellitus with diabetic chronic kidney disease: Secondary | ICD-10-CM | POA: Diagnosis not present

## 2020-05-18 DIAGNOSIS — N186 End stage renal disease: Secondary | ICD-10-CM | POA: Diagnosis not present

## 2020-05-18 DIAGNOSIS — R7881 Bacteremia: Secondary | ICD-10-CM | POA: Diagnosis not present

## 2020-05-20 DIAGNOSIS — J441 Chronic obstructive pulmonary disease with (acute) exacerbation: Secondary | ICD-10-CM | POA: Diagnosis not present

## 2020-05-20 DIAGNOSIS — I4891 Unspecified atrial fibrillation: Secondary | ICD-10-CM | POA: Diagnosis not present

## 2020-05-20 DIAGNOSIS — I2609 Other pulmonary embolism with acute cor pulmonale: Secondary | ICD-10-CM | POA: Diagnosis not present

## 2020-05-20 DIAGNOSIS — M6281 Muscle weakness (generalized): Secondary | ICD-10-CM | POA: Diagnosis not present

## 2020-05-20 DIAGNOSIS — E118 Type 2 diabetes mellitus with unspecified complications: Secondary | ICD-10-CM | POA: Diagnosis not present

## 2020-05-20 DIAGNOSIS — I509 Heart failure, unspecified: Secondary | ICD-10-CM | POA: Diagnosis not present

## 2020-05-20 DIAGNOSIS — D509 Iron deficiency anemia, unspecified: Secondary | ICD-10-CM | POA: Diagnosis not present

## 2020-05-20 DIAGNOSIS — E1122 Type 2 diabetes mellitus with diabetic chronic kidney disease: Secondary | ICD-10-CM | POA: Diagnosis not present

## 2020-05-20 DIAGNOSIS — I639 Cerebral infarction, unspecified: Secondary | ICD-10-CM | POA: Diagnosis not present

## 2020-05-20 DIAGNOSIS — R600 Localized edema: Secondary | ICD-10-CM | POA: Diagnosis not present

## 2020-05-20 DIAGNOSIS — N186 End stage renal disease: Secondary | ICD-10-CM | POA: Diagnosis not present

## 2020-05-20 DIAGNOSIS — Z992 Dependence on renal dialysis: Secondary | ICD-10-CM | POA: Diagnosis not present

## 2020-05-20 DIAGNOSIS — J9691 Respiratory failure, unspecified with hypoxia: Secondary | ICD-10-CM | POA: Diagnosis not present

## 2020-05-20 DIAGNOSIS — K219 Gastro-esophageal reflux disease without esophagitis: Secondary | ICD-10-CM | POA: Diagnosis not present

## 2020-05-20 DIAGNOSIS — R7881 Bacteremia: Secondary | ICD-10-CM | POA: Diagnosis not present

## 2020-05-23 DIAGNOSIS — R7881 Bacteremia: Secondary | ICD-10-CM | POA: Diagnosis not present

## 2020-05-23 DIAGNOSIS — D509 Iron deficiency anemia, unspecified: Secondary | ICD-10-CM | POA: Diagnosis not present

## 2020-05-23 DIAGNOSIS — N186 End stage renal disease: Secondary | ICD-10-CM | POA: Diagnosis not present

## 2020-05-23 DIAGNOSIS — E1122 Type 2 diabetes mellitus with diabetic chronic kidney disease: Secondary | ICD-10-CM | POA: Diagnosis not present

## 2020-05-24 DIAGNOSIS — R2243 Localized swelling, mass and lump, lower limb, bilateral: Secondary | ICD-10-CM | POA: Diagnosis not present

## 2020-05-24 DIAGNOSIS — N186 End stage renal disease: Secondary | ICD-10-CM | POA: Diagnosis not present

## 2020-05-24 DIAGNOSIS — J9691 Respiratory failure, unspecified with hypoxia: Secondary | ICD-10-CM | POA: Diagnosis not present

## 2020-05-24 DIAGNOSIS — M6281 Muscle weakness (generalized): Secondary | ICD-10-CM | POA: Diagnosis not present

## 2020-05-24 DIAGNOSIS — K219 Gastro-esophageal reflux disease without esophagitis: Secondary | ICD-10-CM | POA: Diagnosis not present

## 2020-05-24 DIAGNOSIS — R682 Dry mouth, unspecified: Secondary | ICD-10-CM | POA: Diagnosis not present

## 2020-05-24 DIAGNOSIS — I2609 Other pulmonary embolism with acute cor pulmonale: Secondary | ICD-10-CM | POA: Diagnosis not present

## 2020-05-24 DIAGNOSIS — M4844XG Fatigue fracture of vertebra, thoracic region, subsequent encounter for fracture with delayed healing: Secondary | ICD-10-CM | POA: Diagnosis not present

## 2020-05-24 DIAGNOSIS — R600 Localized edema: Secondary | ICD-10-CM | POA: Diagnosis not present

## 2020-05-24 DIAGNOSIS — I4510 Unspecified right bundle-branch block: Secondary | ICD-10-CM | POA: Diagnosis not present

## 2020-05-24 DIAGNOSIS — J90 Pleural effusion, not elsewhere classified: Secondary | ICD-10-CM | POA: Diagnosis not present

## 2020-05-24 DIAGNOSIS — S22088D Other fracture of T11-T12 vertebra, subsequent encounter for fracture with routine healing: Secondary | ICD-10-CM | POA: Diagnosis not present

## 2020-05-24 DIAGNOSIS — F17290 Nicotine dependence, other tobacco product, uncomplicated: Secondary | ICD-10-CM | POA: Diagnosis not present

## 2020-05-24 DIAGNOSIS — I509 Heart failure, unspecified: Secondary | ICD-10-CM | POA: Diagnosis not present

## 2020-05-24 DIAGNOSIS — J810 Acute pulmonary edema: Secondary | ICD-10-CM | POA: Diagnosis not present

## 2020-05-24 DIAGNOSIS — I498 Other specified cardiac arrhythmias: Secondary | ICD-10-CM | POA: Diagnosis not present

## 2020-05-24 DIAGNOSIS — I639 Cerebral infarction, unspecified: Secondary | ICD-10-CM | POA: Diagnosis not present

## 2020-05-24 DIAGNOSIS — I959 Hypotension, unspecified: Secondary | ICD-10-CM | POA: Diagnosis not present

## 2020-05-24 DIAGNOSIS — I4891 Unspecified atrial fibrillation: Secondary | ICD-10-CM | POA: Diagnosis not present

## 2020-05-24 DIAGNOSIS — J441 Chronic obstructive pulmonary disease with (acute) exacerbation: Secondary | ICD-10-CM | POA: Diagnosis not present

## 2020-05-24 DIAGNOSIS — E118 Type 2 diabetes mellitus with unspecified complications: Secondary | ICD-10-CM | POA: Diagnosis not present

## 2020-05-24 DIAGNOSIS — Z992 Dependence on renal dialysis: Secondary | ICD-10-CM | POA: Diagnosis not present

## 2020-05-24 DIAGNOSIS — I517 Cardiomegaly: Secondary | ICD-10-CM | POA: Diagnosis not present

## 2020-05-25 DIAGNOSIS — D509 Iron deficiency anemia, unspecified: Secondary | ICD-10-CM | POA: Diagnosis not present

## 2020-05-25 DIAGNOSIS — E1122 Type 2 diabetes mellitus with diabetic chronic kidney disease: Secondary | ICD-10-CM | POA: Diagnosis not present

## 2020-05-25 DIAGNOSIS — R7881 Bacteremia: Secondary | ICD-10-CM | POA: Diagnosis not present

## 2020-05-25 DIAGNOSIS — N186 End stage renal disease: Secondary | ICD-10-CM | POA: Diagnosis not present

## 2020-05-27 DIAGNOSIS — M6281 Muscle weakness (generalized): Secondary | ICD-10-CM | POA: Diagnosis not present

## 2020-05-27 DIAGNOSIS — D509 Iron deficiency anemia, unspecified: Secondary | ICD-10-CM | POA: Diagnosis not present

## 2020-05-27 DIAGNOSIS — I639 Cerebral infarction, unspecified: Secondary | ICD-10-CM | POA: Diagnosis not present

## 2020-05-27 DIAGNOSIS — I4891 Unspecified atrial fibrillation: Secondary | ICD-10-CM | POA: Diagnosis not present

## 2020-05-27 DIAGNOSIS — N186 End stage renal disease: Secondary | ICD-10-CM | POA: Diagnosis not present

## 2020-05-27 DIAGNOSIS — R7881 Bacteremia: Secondary | ICD-10-CM | POA: Diagnosis not present

## 2020-05-27 DIAGNOSIS — I2609 Other pulmonary embolism with acute cor pulmonale: Secondary | ICD-10-CM | POA: Diagnosis not present

## 2020-05-27 DIAGNOSIS — I509 Heart failure, unspecified: Secondary | ICD-10-CM | POA: Diagnosis not present

## 2020-05-27 DIAGNOSIS — F4321 Adjustment disorder with depressed mood: Secondary | ICD-10-CM | POA: Diagnosis not present

## 2020-05-27 DIAGNOSIS — R600 Localized edema: Secondary | ICD-10-CM | POA: Diagnosis not present

## 2020-05-27 DIAGNOSIS — J9691 Respiratory failure, unspecified with hypoxia: Secondary | ICD-10-CM | POA: Diagnosis not present

## 2020-05-27 DIAGNOSIS — J441 Chronic obstructive pulmonary disease with (acute) exacerbation: Secondary | ICD-10-CM | POA: Diagnosis not present

## 2020-05-27 DIAGNOSIS — E1122 Type 2 diabetes mellitus with diabetic chronic kidney disease: Secondary | ICD-10-CM | POA: Diagnosis not present

## 2020-05-27 DIAGNOSIS — Z992 Dependence on renal dialysis: Secondary | ICD-10-CM | POA: Diagnosis not present

## 2020-05-27 DIAGNOSIS — K219 Gastro-esophageal reflux disease without esophagitis: Secondary | ICD-10-CM | POA: Diagnosis not present

## 2020-05-30 DIAGNOSIS — N186 End stage renal disease: Secondary | ICD-10-CM | POA: Diagnosis not present

## 2020-05-30 DIAGNOSIS — D509 Iron deficiency anemia, unspecified: Secondary | ICD-10-CM | POA: Diagnosis not present

## 2020-05-30 DIAGNOSIS — E1122 Type 2 diabetes mellitus with diabetic chronic kidney disease: Secondary | ICD-10-CM | POA: Diagnosis not present

## 2020-05-30 DIAGNOSIS — R7881 Bacteremia: Secondary | ICD-10-CM | POA: Diagnosis not present

## 2020-05-31 DIAGNOSIS — N186 End stage renal disease: Secondary | ICD-10-CM | POA: Diagnosis not present

## 2020-05-31 DIAGNOSIS — M6281 Muscle weakness (generalized): Secondary | ICD-10-CM | POA: Diagnosis not present

## 2020-05-31 DIAGNOSIS — I639 Cerebral infarction, unspecified: Secondary | ICD-10-CM | POA: Diagnosis not present

## 2020-05-31 DIAGNOSIS — I4891 Unspecified atrial fibrillation: Secondary | ICD-10-CM | POA: Diagnosis not present

## 2020-05-31 DIAGNOSIS — I509 Heart failure, unspecified: Secondary | ICD-10-CM | POA: Diagnosis not present

## 2020-05-31 DIAGNOSIS — F4321 Adjustment disorder with depressed mood: Secondary | ICD-10-CM | POA: Diagnosis not present

## 2020-05-31 DIAGNOSIS — I480 Paroxysmal atrial fibrillation: Secondary | ICD-10-CM | POA: Diagnosis not present

## 2020-05-31 DIAGNOSIS — J441 Chronic obstructive pulmonary disease with (acute) exacerbation: Secondary | ICD-10-CM | POA: Diagnosis not present

## 2020-05-31 DIAGNOSIS — J9691 Respiratory failure, unspecified with hypoxia: Secondary | ICD-10-CM | POA: Diagnosis not present

## 2020-05-31 DIAGNOSIS — I10 Essential (primary) hypertension: Secondary | ICD-10-CM | POA: Diagnosis not present

## 2020-05-31 DIAGNOSIS — K219 Gastro-esophageal reflux disease without esophagitis: Secondary | ICD-10-CM | POA: Diagnosis not present

## 2020-05-31 DIAGNOSIS — E118 Type 2 diabetes mellitus with unspecified complications: Secondary | ICD-10-CM | POA: Diagnosis not present

## 2020-06-01 DIAGNOSIS — D509 Iron deficiency anemia, unspecified: Secondary | ICD-10-CM | POA: Diagnosis not present

## 2020-06-01 DIAGNOSIS — N186 End stage renal disease: Secondary | ICD-10-CM | POA: Diagnosis not present

## 2020-06-01 DIAGNOSIS — E1122 Type 2 diabetes mellitus with diabetic chronic kidney disease: Secondary | ICD-10-CM | POA: Diagnosis not present

## 2020-06-01 DIAGNOSIS — R7881 Bacteremia: Secondary | ICD-10-CM | POA: Diagnosis not present

## 2020-06-03 DIAGNOSIS — N186 End stage renal disease: Secondary | ICD-10-CM | POA: Diagnosis not present

## 2020-06-03 DIAGNOSIS — D509 Iron deficiency anemia, unspecified: Secondary | ICD-10-CM | POA: Diagnosis not present

## 2020-06-03 DIAGNOSIS — I4891 Unspecified atrial fibrillation: Secondary | ICD-10-CM | POA: Diagnosis not present

## 2020-06-03 DIAGNOSIS — E1122 Type 2 diabetes mellitus with diabetic chronic kidney disease: Secondary | ICD-10-CM | POA: Diagnosis not present

## 2020-06-03 DIAGNOSIS — R7881 Bacteremia: Secondary | ICD-10-CM | POA: Diagnosis not present

## 2020-06-06 DIAGNOSIS — R7881 Bacteremia: Secondary | ICD-10-CM | POA: Diagnosis not present

## 2020-06-06 DIAGNOSIS — E1122 Type 2 diabetes mellitus with diabetic chronic kidney disease: Secondary | ICD-10-CM | POA: Diagnosis not present

## 2020-06-06 DIAGNOSIS — N186 End stage renal disease: Secondary | ICD-10-CM | POA: Diagnosis not present

## 2020-06-06 DIAGNOSIS — D509 Iron deficiency anemia, unspecified: Secondary | ICD-10-CM | POA: Diagnosis not present

## 2020-06-07 DIAGNOSIS — I639 Cerebral infarction, unspecified: Secondary | ICD-10-CM | POA: Diagnosis not present

## 2020-06-07 DIAGNOSIS — K219 Gastro-esophageal reflux disease without esophagitis: Secondary | ICD-10-CM | POA: Diagnosis not present

## 2020-06-07 DIAGNOSIS — F4321 Adjustment disorder with depressed mood: Secondary | ICD-10-CM | POA: Diagnosis not present

## 2020-06-07 DIAGNOSIS — Z992 Dependence on renal dialysis: Secondary | ICD-10-CM | POA: Diagnosis not present

## 2020-06-07 DIAGNOSIS — N186 End stage renal disease: Secondary | ICD-10-CM | POA: Diagnosis not present

## 2020-06-07 DIAGNOSIS — I509 Heart failure, unspecified: Secondary | ICD-10-CM | POA: Diagnosis not present

## 2020-06-07 DIAGNOSIS — M6281 Muscle weakness (generalized): Secondary | ICD-10-CM | POA: Diagnosis not present

## 2020-06-07 DIAGNOSIS — J9691 Respiratory failure, unspecified with hypoxia: Secondary | ICD-10-CM | POA: Diagnosis not present

## 2020-06-07 DIAGNOSIS — J441 Chronic obstructive pulmonary disease with (acute) exacerbation: Secondary | ICD-10-CM | POA: Diagnosis not present

## 2020-06-07 DIAGNOSIS — I10 Essential (primary) hypertension: Secondary | ICD-10-CM | POA: Diagnosis not present

## 2020-06-07 DIAGNOSIS — I480 Paroxysmal atrial fibrillation: Secondary | ICD-10-CM | POA: Diagnosis not present

## 2020-06-07 DIAGNOSIS — I4891 Unspecified atrial fibrillation: Secondary | ICD-10-CM | POA: Diagnosis not present

## 2020-06-07 DIAGNOSIS — E118 Type 2 diabetes mellitus with unspecified complications: Secondary | ICD-10-CM | POA: Diagnosis not present

## 2020-06-08 DIAGNOSIS — U071 COVID-19: Secondary | ICD-10-CM | POA: Diagnosis not present

## 2020-06-08 DIAGNOSIS — N186 End stage renal disease: Secondary | ICD-10-CM | POA: Diagnosis not present

## 2020-06-08 DIAGNOSIS — Z992 Dependence on renal dialysis: Secondary | ICD-10-CM | POA: Diagnosis not present

## 2020-06-08 DIAGNOSIS — D509 Iron deficiency anemia, unspecified: Secondary | ICD-10-CM | POA: Diagnosis not present

## 2020-06-09 DIAGNOSIS — I2699 Other pulmonary embolism without acute cor pulmonale: Secondary | ICD-10-CM | POA: Diagnosis not present

## 2020-06-09 DIAGNOSIS — E119 Type 2 diabetes mellitus without complications: Secondary | ICD-10-CM | POA: Diagnosis not present

## 2020-06-09 DIAGNOSIS — Z992 Dependence on renal dialysis: Secondary | ICD-10-CM | POA: Diagnosis not present

## 2020-06-09 DIAGNOSIS — I10 Essential (primary) hypertension: Secondary | ICD-10-CM | POA: Diagnosis not present

## 2020-06-09 DIAGNOSIS — E785 Hyperlipidemia, unspecified: Secondary | ICD-10-CM | POA: Diagnosis not present

## 2020-06-09 DIAGNOSIS — I4891 Unspecified atrial fibrillation: Secondary | ICD-10-CM | POA: Diagnosis not present

## 2020-06-09 DIAGNOSIS — N186 End stage renal disease: Secondary | ICD-10-CM | POA: Diagnosis not present

## 2020-06-09 DIAGNOSIS — I63423 Cerebral infarction due to embolism of bilateral anterior cerebral arteries: Secondary | ICD-10-CM | POA: Diagnosis not present

## 2020-06-09 DIAGNOSIS — D6859 Other primary thrombophilia: Secondary | ICD-10-CM | POA: Diagnosis not present

## 2020-06-10 DIAGNOSIS — U071 COVID-19: Secondary | ICD-10-CM | POA: Diagnosis not present

## 2020-06-10 DIAGNOSIS — Z992 Dependence on renal dialysis: Secondary | ICD-10-CM | POA: Diagnosis not present

## 2020-06-10 DIAGNOSIS — N186 End stage renal disease: Secondary | ICD-10-CM | POA: Diagnosis not present

## 2020-06-10 DIAGNOSIS — D509 Iron deficiency anemia, unspecified: Secondary | ICD-10-CM | POA: Diagnosis not present

## 2020-06-11 DIAGNOSIS — Z992 Dependence on renal dialysis: Secondary | ICD-10-CM | POA: Diagnosis not present

## 2020-06-11 DIAGNOSIS — N186 End stage renal disease: Secondary | ICD-10-CM | POA: Diagnosis not present

## 2020-06-12 DIAGNOSIS — N186 End stage renal disease: Secondary | ICD-10-CM | POA: Diagnosis not present

## 2020-06-12 DIAGNOSIS — Z992 Dependence on renal dialysis: Secondary | ICD-10-CM | POA: Diagnosis not present

## 2020-06-13 DIAGNOSIS — Z992 Dependence on renal dialysis: Secondary | ICD-10-CM | POA: Diagnosis not present

## 2020-06-13 DIAGNOSIS — N186 End stage renal disease: Secondary | ICD-10-CM | POA: Diagnosis not present

## 2020-06-14 DIAGNOSIS — Z992 Dependence on renal dialysis: Secondary | ICD-10-CM | POA: Diagnosis not present

## 2020-06-14 DIAGNOSIS — N186 End stage renal disease: Secondary | ICD-10-CM | POA: Diagnosis not present

## 2020-06-15 DIAGNOSIS — U071 COVID-19: Secondary | ICD-10-CM | POA: Diagnosis not present

## 2020-06-15 DIAGNOSIS — N186 End stage renal disease: Secondary | ICD-10-CM | POA: Diagnosis not present

## 2020-06-15 DIAGNOSIS — Z992 Dependence on renal dialysis: Secondary | ICD-10-CM | POA: Diagnosis not present

## 2020-06-15 DIAGNOSIS — D509 Iron deficiency anemia, unspecified: Secondary | ICD-10-CM | POA: Diagnosis not present

## 2020-06-16 DIAGNOSIS — Z992 Dependence on renal dialysis: Secondary | ICD-10-CM | POA: Diagnosis not present

## 2020-06-16 DIAGNOSIS — N186 End stage renal disease: Secondary | ICD-10-CM | POA: Diagnosis not present

## 2020-06-17 DIAGNOSIS — N186 End stage renal disease: Secondary | ICD-10-CM | POA: Diagnosis not present

## 2020-06-17 DIAGNOSIS — Z992 Dependence on renal dialysis: Secondary | ICD-10-CM | POA: Diagnosis not present

## 2020-06-17 DIAGNOSIS — D509 Iron deficiency anemia, unspecified: Secondary | ICD-10-CM | POA: Diagnosis not present

## 2020-06-17 DIAGNOSIS — U071 COVID-19: Secondary | ICD-10-CM | POA: Diagnosis not present

## 2020-06-18 DIAGNOSIS — Z992 Dependence on renal dialysis: Secondary | ICD-10-CM | POA: Diagnosis not present

## 2020-06-18 DIAGNOSIS — N186 End stage renal disease: Secondary | ICD-10-CM | POA: Diagnosis not present

## 2020-06-19 DIAGNOSIS — N186 End stage renal disease: Secondary | ICD-10-CM | POA: Diagnosis not present

## 2020-06-19 DIAGNOSIS — Z992 Dependence on renal dialysis: Secondary | ICD-10-CM | POA: Diagnosis not present

## 2020-06-20 DIAGNOSIS — Z992 Dependence on renal dialysis: Secondary | ICD-10-CM | POA: Diagnosis not present

## 2020-06-20 DIAGNOSIS — N186 End stage renal disease: Secondary | ICD-10-CM | POA: Diagnosis not present

## 2020-06-21 DIAGNOSIS — Z992 Dependence on renal dialysis: Secondary | ICD-10-CM | POA: Diagnosis not present

## 2020-06-21 DIAGNOSIS — N186 End stage renal disease: Secondary | ICD-10-CM | POA: Diagnosis not present

## 2020-06-22 DIAGNOSIS — D509 Iron deficiency anemia, unspecified: Secondary | ICD-10-CM | POA: Diagnosis not present

## 2020-06-22 DIAGNOSIS — U071 COVID-19: Secondary | ICD-10-CM | POA: Diagnosis not present

## 2020-06-22 DIAGNOSIS — N186 End stage renal disease: Secondary | ICD-10-CM | POA: Diagnosis not present

## 2020-06-22 DIAGNOSIS — Z992 Dependence on renal dialysis: Secondary | ICD-10-CM | POA: Diagnosis not present

## 2020-07-08 DEATH — deceased
